# Patient Record
Sex: Female | Born: 1945 | Hispanic: No | Smoking: Former smoker
Health system: Southern US, Community
[De-identification: ages and names within clinical notes are randomized; demographics above are authoritative.]

## PROBLEM LIST (undated history)

## (undated) ENCOUNTER — Emergency Department (HOSPITAL_BASED_OUTPATIENT_CLINIC_OR_DEPARTMENT_OTHER)

## (undated) DIAGNOSIS — I739 Peripheral vascular disease, unspecified: Secondary | ICD-10-CM

## (undated) DIAGNOSIS — I2510 Atherosclerotic heart disease of native coronary artery without angina pectoris: Secondary | ICD-10-CM

## (undated) DIAGNOSIS — R011 Cardiac murmur, unspecified: Secondary | ICD-10-CM

## (undated) DIAGNOSIS — I10 Essential (primary) hypertension: Secondary | ICD-10-CM

## (undated) DIAGNOSIS — R9431 Abnormal electrocardiogram [ECG] [EKG]: Secondary | ICD-10-CM

## (undated) DIAGNOSIS — K219 Gastro-esophageal reflux disease without esophagitis: Secondary | ICD-10-CM

## (undated) DIAGNOSIS — T7840XA Allergy, unspecified, initial encounter: Secondary | ICD-10-CM

## (undated) DIAGNOSIS — M1990 Unspecified osteoarthritis, unspecified site: Secondary | ICD-10-CM

## (undated) DIAGNOSIS — K5732 Diverticulitis of large intestine without perforation or abscess without bleeding: Secondary | ICD-10-CM

## (undated) DIAGNOSIS — M8580 Other specified disorders of bone density and structure, unspecified site: Secondary | ICD-10-CM

## (undated) DIAGNOSIS — F419 Anxiety disorder, unspecified: Secondary | ICD-10-CM

## (undated) DIAGNOSIS — I4891 Unspecified atrial fibrillation: Secondary | ICD-10-CM

## (undated) DIAGNOSIS — E785 Hyperlipidemia, unspecified: Secondary | ICD-10-CM

## (undated) HISTORY — DX: Hyperlipidemia, unspecified: E78.5

## (undated) HISTORY — DX: Atherosclerotic heart disease of native coronary artery without angina pectoris: I25.10

## (undated) HISTORY — PX: APPENDECTOMY: SHX54

## (undated) HISTORY — DX: Cardiac murmur, unspecified: R01.1

## (undated) HISTORY — DX: Abnormal electrocardiogram (ECG) (EKG): R94.31

## (undated) HISTORY — DX: Essential (primary) hypertension: I10

## (undated) HISTORY — PX: SMALL INTESTINE SURGERY: SHX150

## (undated) HISTORY — PX: CATARACT EXTRACTION: SUR2

## (undated) HISTORY — DX: Allergy, unspecified, initial encounter: T78.40XA

## (undated) HISTORY — DX: Peripheral vascular disease, unspecified: I73.9

## (undated) HISTORY — PX: CHOLECYSTECTOMY: SHX55

## (undated) HISTORY — PX: EYE SURGERY: SHX253

## (undated) HISTORY — DX: Gastro-esophageal reflux disease without esophagitis: K21.9

## (undated) HISTORY — DX: Diverticulitis of large intestine without perforation or abscess without bleeding: K57.32

## (undated) HISTORY — PX: ABDOMINAL HYSTERECTOMY: SHX81

## (undated) HISTORY — DX: Unspecified osteoarthritis, unspecified site: M19.90

## (undated) HISTORY — DX: Unspecified atrial fibrillation: I48.91

## (undated) HISTORY — DX: Other specified disorders of bone density and structure, unspecified site: M85.80

---

## 1993-06-17 HISTORY — PX: SUBCLAVIAN ARTERY STENT: SHX2452

## 1999-01-13 ENCOUNTER — Emergency Department (HOSPITAL_COMMUNITY): Admission: EM | Admit: 1999-01-13 | Discharge: 1999-01-13 | Payer: Self-pay | Admitting: Emergency Medicine

## 1999-01-15 ENCOUNTER — Emergency Department (HOSPITAL_COMMUNITY): Admission: EM | Admit: 1999-01-15 | Discharge: 1999-01-15 | Payer: Self-pay | Admitting: Emergency Medicine

## 1999-07-02 ENCOUNTER — Inpatient Hospital Stay (HOSPITAL_COMMUNITY): Admission: AD | Admit: 1999-07-02 | Discharge: 1999-07-04 | Payer: Self-pay | Admitting: Pulmonary Disease

## 1999-07-02 ENCOUNTER — Encounter: Payer: Self-pay | Admitting: Pulmonary Disease

## 1999-08-28 ENCOUNTER — Ambulatory Visit: Admission: RE | Admit: 1999-08-28 | Discharge: 1999-08-28 | Payer: Self-pay | Admitting: Pulmonary Disease

## 1999-12-16 ENCOUNTER — Emergency Department (HOSPITAL_COMMUNITY): Admission: EM | Admit: 1999-12-16 | Discharge: 1999-12-16 | Payer: Self-pay | Admitting: Emergency Medicine

## 2000-07-09 ENCOUNTER — Emergency Department (HOSPITAL_COMMUNITY): Admission: EM | Admit: 2000-07-09 | Discharge: 2000-07-09 | Payer: Self-pay | Admitting: Emergency Medicine

## 2001-02-04 ENCOUNTER — Other Ambulatory Visit: Admission: RE | Admit: 2001-02-04 | Discharge: 2001-02-04 | Payer: Self-pay | Admitting: Obstetrics and Gynecology

## 2001-09-15 ENCOUNTER — Emergency Department (HOSPITAL_COMMUNITY): Admission: EM | Admit: 2001-09-15 | Discharge: 2001-09-15 | Payer: Self-pay | Admitting: Emergency Medicine

## 2001-09-22 ENCOUNTER — Encounter: Payer: Self-pay | Admitting: Cardiovascular Disease

## 2001-09-22 ENCOUNTER — Ambulatory Visit (HOSPITAL_COMMUNITY): Admission: RE | Admit: 2001-09-22 | Discharge: 2001-09-22 | Payer: Self-pay | Admitting: Cardiovascular Disease

## 2001-10-23 ENCOUNTER — Ambulatory Visit (HOSPITAL_COMMUNITY): Admission: RE | Admit: 2001-10-23 | Discharge: 2001-10-24 | Payer: Self-pay | Admitting: Cardiovascular Disease

## 2001-10-23 ENCOUNTER — Encounter: Payer: Self-pay | Admitting: Cardiovascular Disease

## 2003-02-20 ENCOUNTER — Emergency Department (HOSPITAL_COMMUNITY): Admission: EM | Admit: 2003-02-20 | Discharge: 2003-02-20 | Payer: Self-pay | Admitting: Emergency Medicine

## 2003-11-17 ENCOUNTER — Inpatient Hospital Stay (HOSPITAL_COMMUNITY): Admission: AD | Admit: 2003-11-17 | Discharge: 2003-11-19 | Payer: Self-pay | Admitting: Pulmonary Disease

## 2005-04-04 ENCOUNTER — Emergency Department (HOSPITAL_COMMUNITY): Admission: EM | Admit: 2005-04-04 | Discharge: 2005-04-04 | Payer: Self-pay | Admitting: Emergency Medicine

## 2005-09-04 ENCOUNTER — Observation Stay (HOSPITAL_COMMUNITY): Admission: EM | Admit: 2005-09-04 | Discharge: 2005-09-05 | Payer: Self-pay | Admitting: Emergency Medicine

## 2007-07-05 ENCOUNTER — Ambulatory Visit: Payer: Self-pay | Admitting: Hospitalist

## 2007-07-05 ENCOUNTER — Inpatient Hospital Stay (HOSPITAL_COMMUNITY): Admission: EM | Admit: 2007-07-05 | Discharge: 2007-07-11 | Payer: Self-pay | Admitting: Emergency Medicine

## 2007-07-08 ENCOUNTER — Encounter (INDEPENDENT_AMBULATORY_CARE_PROVIDER_SITE_OTHER): Payer: Self-pay | Admitting: Hospitalist

## 2008-01-06 DIAGNOSIS — I739 Peripheral vascular disease, unspecified: Secondary | ICD-10-CM

## 2008-01-06 HISTORY — DX: Peripheral vascular disease, unspecified: I73.9

## 2008-05-25 ENCOUNTER — Inpatient Hospital Stay (HOSPITAL_COMMUNITY): Admission: EM | Admit: 2008-05-25 | Discharge: 2008-05-27 | Payer: Self-pay | Admitting: Emergency Medicine

## 2008-06-03 HISTORY — PX: CARDIAC CATHETERIZATION: SHX172

## 2008-09-19 ENCOUNTER — Inpatient Hospital Stay (HOSPITAL_COMMUNITY): Admission: AD | Admit: 2008-09-19 | Discharge: 2008-09-21 | Payer: Self-pay | Admitting: Cardiovascular Disease

## 2008-09-20 HISTORY — PX: SUBCLAVIAN ARTERY STENT: SHX2452

## 2009-07-13 ENCOUNTER — Ambulatory Visit: Payer: Self-pay | Admitting: Vascular Surgery

## 2009-07-18 ENCOUNTER — Ambulatory Visit: Payer: Self-pay | Admitting: Vascular Surgery

## 2009-07-18 ENCOUNTER — Inpatient Hospital Stay (HOSPITAL_COMMUNITY): Admission: RE | Admit: 2009-07-18 | Discharge: 2009-07-23 | Payer: Self-pay | Admitting: Neurosurgery

## 2009-07-18 HISTORY — PX: SPINE SURGERY: SHX786

## 2009-12-28 ENCOUNTER — Ambulatory Visit: Payer: Self-pay | Admitting: Vascular Surgery

## 2010-01-18 ENCOUNTER — Ambulatory Visit: Payer: Self-pay | Admitting: Vascular Surgery

## 2010-01-23 ENCOUNTER — Inpatient Hospital Stay (HOSPITAL_COMMUNITY): Admission: RE | Admit: 2010-01-23 | Discharge: 2010-01-27 | Payer: Self-pay | Admitting: Vascular Surgery

## 2010-01-23 ENCOUNTER — Ambulatory Visit: Payer: Self-pay | Admitting: Vascular Surgery

## 2010-01-23 HISTORY — PX: OTHER SURGICAL HISTORY: SHX169

## 2010-02-14 ENCOUNTER — Ambulatory Visit: Payer: Self-pay | Admitting: Vascular Surgery

## 2010-03-29 ENCOUNTER — Ambulatory Visit: Payer: Self-pay | Admitting: Vascular Surgery

## 2010-07-08 ENCOUNTER — Encounter: Payer: Self-pay | Admitting: Gastroenterology

## 2010-08-15 ENCOUNTER — Ambulatory Visit (INDEPENDENT_AMBULATORY_CARE_PROVIDER_SITE_OTHER): Admitting: Vascular Surgery

## 2010-08-15 ENCOUNTER — Encounter (INDEPENDENT_AMBULATORY_CARE_PROVIDER_SITE_OTHER)

## 2010-08-15 DIAGNOSIS — Z48812 Encounter for surgical aftercare following surgery on the circulatory system: Secondary | ICD-10-CM

## 2010-08-15 DIAGNOSIS — I6529 Occlusion and stenosis of unspecified carotid artery: Secondary | ICD-10-CM

## 2010-08-15 DIAGNOSIS — M79609 Pain in unspecified limb: Secondary | ICD-10-CM

## 2010-08-15 DIAGNOSIS — I739 Peripheral vascular disease, unspecified: Secondary | ICD-10-CM

## 2010-08-16 NOTE — Assessment & Plan Note (Signed)
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  She had undergone previous attempts at subclavian stenting in the past by Dr. Alanda Amass and ultimately presented with continued problems with dizziness and left arm symptoms.  She had a left carotid subclavian bypass with a 7-mm Dacron graft on January 23, 2010.  She comes in for a 36-month follow-up visit.  She states that she has had no real problems with dizziness since her surgery.  She has had no arm pain. She has some intermittent swelling in the supraclavicular area and has had some paresthesias in the anterior chest wall which are gradually improving.  REVIEW OF SYSTEMS:  She had no chest pressure, chest pain, palpitations or arrhythmias.  PHYSICAL EXAMINATION:  General:  This is a pleasant 65 year old woman who appears her stated age.  Vital signs;  Blood pressure is 135/72 on the right and 121/68 on the left, heart rate is 54, saturation 95% and incision has healed nicely.  She has palpable radial pulse.  I had also performed previous exposure on her for her L5-S1 ALIF and this incision has healed nicely.  I did independently interpret her duplex scan today which shows that her carotid subclavian bypass graft is widely patent.  She has triphasic waveforms throughout.  Overall, I am pleased with her progress and I will see her back p.r.n.    Di Kindle. Edilia Bo, M.D. Electronically Signed  CSD/MEDQ  D:  08/15/2010  T:  08/16/2010  Job:  [id number]

## 2010-08-22 NOTE — Procedures (Unsigned)
VASCULAR LAB EXAM  INDICATION:  Follow up left carotid-to-subclavian bypass graft, placed 01/23/2010.  History of multiple previous attempts at subclavian root vascularization via stenting.  HISTORY: Diabetes:  No. Cardiac:  No. Hypertension:  Yes.  EXAM: 1. Blood pressure on the right is 130 mmHg.  On the left is 114 mmHg. 2. Duplex of the left CCA to the subclavian bypass graft shows patency     with triphasic waveforms throughout.  Please see diagram for     details.  IMPRESSION: 1. Widely patent left common carotid artery to left subclavian graft. 2. Left brachial ulnar and radial arteries are within normal limits.  ___________________________________________ Di Kindle. Edilia Bo, M.D.  LT/MEDQ  D:  08/15/2010  T:  08/15/2010  Job:  [id number]

## 2010-08-31 LAB — URINALYSIS, ROUTINE W REFLEX MICROSCOPIC
Nitrite: NEGATIVE
Protein, ur: NEGATIVE mg/dL
Specific Gravity, Urine: 1.017 (ref 1.005–1.030)
Urobilinogen, UA: 0.2 mg/dL (ref 0.0–1.0)

## 2010-08-31 LAB — CBC
Hemoglobin: 10.8 g/dL — ABNORMAL LOW (ref 12.0–15.0)
Hemoglobin: 12.9 g/dL (ref 12.0–15.0)
MCH: 29.4 pg (ref 26.0–34.0)
MCH: 29.9 pg (ref 26.0–34.0)
Platelets: 268 10*3/uL (ref 150–400)
RBC: 3.67 MIL/uL — ABNORMAL LOW (ref 3.87–5.11)
RBC: 4.32 MIL/uL (ref 3.87–5.11)
WBC: 6.2 10*3/uL (ref 4.0–10.5)

## 2010-08-31 LAB — COMPREHENSIVE METABOLIC PANEL
ALT: 21 U/L (ref 0–35)
AST: 26 U/L (ref 0–37)
Albumin: 3.7 g/dL (ref 3.5–5.2)
Alkaline Phosphatase: 84 U/L (ref 39–117)
CO2: 30 mEq/L (ref 19–32)
Chloride: 96 mEq/L (ref 96–112)
Creatinine, Ser: 0.91 mg/dL (ref 0.4–1.2)
GFR calc Af Amer: >60 mL/min (ref >60)
GFR calc non Af Amer: >60 mL/min (ref >60)
Potassium: 4 mEq/L (ref 3.5–5.1)
Total Bilirubin: 0.5 mg/dL (ref 0.3–1.2)

## 2010-08-31 LAB — SURGICAL PCR SCREEN
MRSA, PCR: NEGATIVE
Staphylococcus aureus: NEGATIVE

## 2010-08-31 LAB — BASIC METABOLIC PANEL
CO2: 25 mEq/L (ref 19–32)
CO2: 29 mEq/L (ref 19–32)
Calcium: 8.3 mg/dL — ABNORMAL LOW (ref 8.4–10.5)
Chloride: 99 mEq/L (ref 96–112)
Creatinine, Ser: 0.75 mg/dL (ref 0.4–1.2)
Creatinine, Ser: 1 mg/dL (ref 0.4–1.2)
GFR calc Af Amer: >60 mL/min (ref >60)
GFR calc Af Amer: >60 mL/min (ref >60)
GFR calc non Af Amer: >60 mL/min (ref >60)
Glucose, Bld: 127 mg/dL — ABNORMAL HIGH (ref 70–99)
Sodium: 134 mEq/L — ABNORMAL LOW (ref 135–145)
Sodium: 137 mEq/L (ref 135–145)

## 2010-08-31 LAB — URINE CULTURE

## 2010-08-31 LAB — TYPE AND SCREEN

## 2010-08-31 LAB — APTT: aPTT: 27 seconds (ref 24–37)

## 2010-09-03 LAB — BASIC METABOLIC PANEL
BUN: 8 mg/dL (ref 6–23)
CO2: 29 mEq/L (ref 19–32)
Glucose, Bld: 110 mg/dL — ABNORMAL HIGH (ref 70–99)
Potassium: 4.4 mEq/L (ref 3.5–5.1)
Sodium: 138 mEq/L (ref 135–145)

## 2010-09-03 LAB — CBC
HCT: 38 % (ref 36.0–46.0)
Hemoglobin: 13.1 g/dL (ref 12.0–15.0)
MCHC: 34.4 g/dL (ref 30.0–36.0)
MCV: 89.2 fL (ref 78.0–100.0)
Platelets: 336 10*3/uL (ref 150–400)
RDW: 13.1 % (ref 11.5–15.5)

## 2010-09-07 LAB — URINALYSIS, ROUTINE W REFLEX MICROSCOPIC
Glucose, UA: NEGATIVE mg/dL
Protein, ur: 30 mg/dL — AB
Specific Gravity, Urine: 1.019 (ref 1.005–1.030)

## 2010-09-07 LAB — CBC
HCT: 32 % — ABNORMAL LOW (ref 36.0–46.0)
Hemoglobin: 11 g/dL — ABNORMAL LOW (ref 12.0–15.0)
MCHC: 34.4 g/dL (ref 30.0–36.0)
MCV: 91.4 fL (ref 78.0–100.0)
RBC: 3.5 MIL/uL — ABNORMAL LOW (ref 3.87–5.11)

## 2010-09-07 LAB — DIFFERENTIAL
Basophils Relative: 1 % (ref 0–1)
Eosinophils Absolute: 0.1 10*3/uL (ref 0.0–0.7)
Eosinophils Relative: 1 % (ref 0–5)
Monocytes Absolute: 0.6 10*3/uL (ref 0.1–1.0)
Monocytes Relative: 7 % (ref 3–12)
Neutro Abs: 5.1 10*3/uL (ref 1.7–7.7)

## 2010-09-07 LAB — URINE MICROSCOPIC-ADD ON

## 2010-09-07 LAB — CULTURE, BLOOD (ROUTINE X 2): Culture: NO GROWTH

## 2010-09-07 LAB — URINE CULTURE: Colony Count: 4000

## 2010-09-26 LAB — DIFFERENTIAL
Basophils Absolute: 0 10*3/uL (ref 0.0–0.1)
Basophils Relative: 0 % (ref 0–1)
Neutro Abs: 5.4 10*3/uL (ref 1.7–7.7)
Neutrophils Relative %: 81 % — ABNORMAL HIGH (ref 43–77)

## 2010-09-26 LAB — CBC
HCT: 31.1 % — ABNORMAL LOW (ref 36.0–46.0)
Hemoglobin: 10.6 g/dL — ABNORMAL LOW (ref 12.0–15.0)
MCHC: 34.2 g/dL (ref 30.0–36.0)
MCHC: 34.5 g/dL (ref 30.0–36.0)
MCV: 88.3 fL (ref 78.0–100.0)
Platelets: 295 10*3/uL (ref 150–400)
RBC: 3.52 MIL/uL — ABNORMAL LOW (ref 3.87–5.11)
RDW: 12.7 % (ref 11.5–15.5)

## 2010-09-26 LAB — BASIC METABOLIC PANEL
CO2: 25 mEq/L (ref 19–32)
CO2: 25 mEq/L (ref 19–32)
Calcium: 8.6 mg/dL (ref 8.4–10.5)
Chloride: 105 mEq/L (ref 96–112)
Creatinine, Ser: 0.81 mg/dL (ref 0.4–1.2)
Creatinine, Ser: 0.86 mg/dL (ref 0.4–1.2)
GFR calc Af Amer: >60 mL/min (ref >60)
GFR calc Af Amer: >60 mL/min (ref >60)
Glucose, Bld: 151 mg/dL — ABNORMAL HIGH (ref 70–99)

## 2010-09-26 LAB — IRON AND TIBC
Saturation Ratios: 13 % — ABNORMAL LOW (ref 20–55)
TIBC: 368 ug/dL (ref 250–470)

## 2010-09-26 LAB — FOLATE RBC: RBC Folate: 842 ng/mL — ABNORMAL HIGH (ref 180–600)

## 2010-10-30 NOTE — Assessment & Plan Note (Signed)
OFFICE VISIT   Traci [name], Traci [name] A  DOB:  15-Nov-1945                                       02/14/2010  KGMWN#:[id number]   I saw the patient in the office today for follow-up after her recent  left carotid subclavian bypass.  This is a 65 year old woman who  presented with pain in her left arm with exertion and recurrent problems  with dizziness.  She had previous attempts at subclavian stenting in the  past, but this had recurred multiple times.  Therefore it was felt the  best option for revascularization was surgical revascularization.  She  underwent left carotid subclavian bypass with a 7-mm Dacron graft on  January 23, 2010.  She did have some initial problems with swelling around  the incision, but this has resolved.  She has had no further problems  with dizziness and no pain in her arm.   EXAMINATION:  Blood pressure is 133/80, heart rate is 74, saturation is  98%.  Lungs are clear bilaterally to auscultation.  Cardiovascular  examination; she has a regular rate and rhythm.  She has palpable radial  pulses.  Her supraclavicular incision on the left is healing nicely.   Overall I am pleased with progress and I will see her back in 6 months  and check a graft duplex.  She knows to call sooner if she has problems.     Di Kindle. Edilia Bo, M.D.  Electronically Signed   CSD/MEDQ  D:  02/14/2010  T:  02/15/2010  Job:  [id number]   cc:   Gerlene Burdock A. Alanda Amass, M.D.  Dalbert Mayotte, M.D.  Danae Orleans. Venetia Maxon, M.D.

## 2010-10-30 NOTE — Consult Note (Signed)
NAMEANGELIAH, WISDOM            ACCOUNT NO.:  [id number]   MEDICAL RECORD NO.:  [id number]          PATIENT TYPE:  INP   LOCATION:  3705                         FACILITY:  [facility]   PHYSICIAN:  Bevelyn Buckles. Champey, M.D.DATE OF BIRTH:  12/15/1945   DATE OF CONSULTATION:  07/08/2007  DATE OF DISCHARGE:                                 CONSULTATION   REASON FOR CONSULTATION:  Headache.   HISTORY OF PRESENT ILLNESS:  This is a 65 year old Caucasian female with  multiple medical problems who presents with severe headache since  Sunday. The patient states she was talking on the phone when she  developed a severe headache in bi-frontal top of the head area. The  headache is described as pressure, 10 out of 10 in intensity, without  any radiation. The patient also felt light headedness and faint feeling,  however, did not lose consciousness. She also had palpitations during  that initially. She had no nausea, photophobia, or phonophobia, then  developed photophobia. She also complained of bright squiggly lines  during her headache and some bilateral finger tingling. She denies any  focal weakness, speech changes, swallowing problems, chewing problems,  vertigo, or loss of consciousness. Headache has improved since  admission. She has had extensive workup with no etiology found.   PAST MEDICAL HISTORY:  Positive for mitral valve prolapse, fibromyalgia,  irritable bowel, PVD, hypertension, history of subclavian stenosis,  supraventricular tachycardia, hepatitis and anxiety.   CURRENT MEDICATIONS:  Tranxene, Zocor, and Tylenol p.r.n.   HOME MEDICATIONS:  Included Lasix, Atenolol, potassium chloride,  Lipitor, and aspirin.   ALLERGIES:  SULFA, ERYTHROMYCIN, CODEINE, SHELLFISH, INTRAVENOUS DYE.   FAMILY HISTORY:  Positive for heart disease and kidney failure.   SOCIAL HISTORY:  The patient is a former smoker. Quit in 1995. Is  married. Lives with her husband.   REVIEW OF SYSTEMS:  Positive  as per HPI. Review of systems is negative  as per HPI and greater than 7 other organ systems.   PHYSICAL EXAMINATION:  VITAL SIGNS:  Temperature 98.6, blood pressure  136/53, pulse 56, respiratory rate 18. O2 sat 99%.  HEENT:  Normocephalic and atraumatic. Extraocular muscles intact. Pupils  are equal. Face is symmetric.  NECK:  Supple. No carotid bruits.  HEART:  Regular.  LUNGS:  Clear.  ABDOMEN:  Soft, nontender.  EXTREMITIES:  Show good pulses.  NEUROLOGIC:  Awake and alert. Language is fluent. Patient follows  commands appropriately. Cranial nerves 2-12 are grossly intact. Motor  examination shows good strength and normal tone in all 4 extremities. No  drift is noted. Sensory examination is within normal limits to light  touch. Reflexes are 1 to 2+ and symmetric. Cerebellar function is within  normal limits. Finger-to-nose is non-assessed secondary to safety.   DIAGNOSTIC STUDIES:  MRI and MRA showed no acute strokes, small vessel  disease. MRA showed no occlusion or stenosis. MRA of the neck showed  mild narrowing at the origin of the bursa bilaterally, decreasing of the  left vertebral artery, question artifact versus narrowing.   A 2-D echocardiogram showed systolic function was normal with mitral  valve prolapse.  LABORATORY DATA:  ANA was negative. ESR is 8. CSF study showed WBC of 2,  RBC is zero. Protein 39, glucose 58. WBC is 5.4. Hemoglobin and  hematocrit 11.1 and 33.0. Platelets 249,000. Sodium 141, potassium 3.8,  chloride 109, CO2 27, BUN 7, creatinine 0.83. Glucose 90.   IMPRESSION:  This is a 65 year old Caucasian female with severe  headache, which has been gradually improving. Some features of the  headache are consistent with migraines and some are atypical for  migraine. The workup has been negative for a cause of headache, so far.  There is a questionable left vertebral narrowing, which might be  artifactual. I will recommend getting a transcranial  Doppler to further  evaluate. Recommend placing the patient back on her aspirin a day, given  her subclavian syndrome and mild narrowing on the MRA with small vessel  disease. Would recommend checking MRV of the brain to rule out any other  possible etiologies for headache. For headaches, I would recommend  giving her Depacon 1 gram IV x1 now and also use Reglan 5 mg IV q.6  hours p.r.n. for headache and nausea if needed. The patient did receive  Darvocet, which also seems to be beneficial to the patient. We will  follow the patient while she is in the hospital. I have discussed the  plan with the primary team and will order these recommendations.      Bevelyn Buckles. Nash Shearer, M.D.  Electronically Signed     DRC/MEDQ  D:  07/08/2007  T:  07/08/2007  Job:  [id number]

## 2010-10-30 NOTE — Procedures (Signed)
CAROTID DUPLEX EXAM   INDICATION:  Subclavian stenosis, preop for carotid to subclavian bypass  graft.   HISTORY:  Diabetes:  No.  Cardiac:  Mitral valve prolapse.  Hypertension:  Yes.  Smoking:  No.  Previous Surgery:  Left subclavian stents.  CV History:  Asymptomatic.  Amaurosis Fugax No, Paresthesias No, Hemiparesis No                                       RIGHT             LEFT  Brachial systolic pressure:         155               99  Brachial Doppler waveforms:         Normal            Abnormal  Vertebral direction of flow:        Antegrade         Retrograde  DUPLEX VELOCITIES (cm/sec)  CCA peak systolic                   100               110  ECA peak systolic                   72                81  ICA peak systolic                   131               107  ICA end diastolic                   35                31  PLAQUE MORPHOLOGY:                  Mixed             Mixed  PLAQUE AMOUNT:                      Mild              Mild  PLAQUE LOCATION:                    ICA               ICA   IMPRESSION:  1. Doppler velocity suggests low end 40%-59% stenosis in the right      internal carotid artery.  2. Doppler velocity suggests high end 20%-39% stenosis in the left      internal carotid artery.  3. Antegrade flow noted in the right vertebral artery.  Retrograde      flow noted in the left vertebral artery.  4. Patent left subclavian artery past proximal stent.  Proximal      subclavian artery 74 cm/second.  Distal subclavian artery 97      cm/second.  5. Unable to accurately visualize the subclavian stent but the highest      velocity obtained was 392 cm/second in the proximal subclavian      artery.       ___________________________________________  Di Kindle. Edilia Bo, M.D.   NT/MEDQ  D:  12/28/2009  T:  12/28/2009  Job:  [id number]

## 2010-10-30 NOTE — Cardiovascular Report (Signed)
NAMENURIA, PHEBUS NO.:  [id number]   MEDICAL RECORD NO.:  [id number]          PATIENT TYPE:  INP   LOCATION:  2507                         FACILITY:  [facility]   PHYSICIAN:  Nanetta Batty, M.D.   DATE OF BIRTH:  11/05/1945   DATE OF PROCEDURE:  DATE OF DISCHARGE:                            CARDIAC CATHETERIZATION    Traci [name] is a 65 year old female with a history of CAD and PAD.  She  has had left subclavian stenting and re-stenting in the past.  Recent  cath performed last year showed a 70% left subclavian artery stenosis  with a 50-mm gradient.  She has demonstrated symptoms from this and was  admitted by Dr. Alanda Amass for premedication because of contrast allergy.  She presents now for angiography and intervention.   PROCEDURE DESCRIPTION:  The patient was brought to the Second Floor  [facility] in the postabsorptive state.  She was  premedicated with p.o. Valium, IV Versed, fentanyl, Solu-Cortef,  Benadryl, and Pepcid.  Her right groin was prepped and shaved in the  usual sterile fashion.  Xylocaine 1% was used for local anesthesia. A  5-  French sheath was inserted into the right femoral artery using standard  Seldinger technique.  A 5-French pigtail catheter and JB1 catheter were  used for arch angiography in the LAO view,Her right groin was prepped  and shaved in usual sterile fashion pills Xylocaine was used local  anesthesia.  A 5-French sheath was inserted in the right femoral artery  using standard Seldinger technique.  A 5-French long tennis racket  catheter was used for arch angiography in the LAO view.  A 5-French long  right Judkins catheter was used for selective innominate angiography  with delayed imaging of the left vertebral and angiography of the left  subclavian artery.  Visipaque dye was used for the entirety of the case.  Retrograde aortic pressures were monitored in the case.   ANGIOGRAPHIC RESULTS:  1. Arch  aortogram.      a.     Type III arch.  2. Right innominate; widely patent.      a.     Right subclavian; eccentric 40% calcified ostial stenosis       unchanged from prior studies.  3. Left subclavian; left subclavian artery had approximately 80%      ostial in-stent restenosis with an 80-mm gradient.    Existing five 5-French sheath was exchanged over wooly wire for a 6-  [place] 90-cm long bright tip Cordis sheath.  The wooly wire was placed  across the subclavian stent into the distal subclavian artery.  Using a  7-mm x 18-mm long Cordis Genesis on Opta premount.  PTA was performed in  the ostium of the right subclavian artery with predilatation using the  same balloon delivery system at 12 atmospheres resulting in reduction of  a 80% ostial stenosis to 0% residual.  The patient tolerated the  procedure well.  The wire was removed.  The sheath was exchanged for a  short 6-French sheath.  The patient left lab in stable condition.  The  ACT was  approximate at 200 and the sheath will be pulled when the ACT is  below 175.  Dr. Alanda Amass reviewed the final films.   IMPRESSION:  Successful percutaneous transluminal angioplasty and  stenting of a symptomatic left subclavian artery in-stent restenosis  with an 80% ostial lesion, ultimately 0% residual secondary to re-  stenting for in-stent restenosis.  The patient will be gently hydrated  and remain on aspirin and Plavix.  She will be discharged home in the  morning and get followup Dopplers of the upper extremities and will see  Dr. Alanda Amass back in followup.      Nanetta Batty, M.D.  Electronically Signed     JB/MEDQ  D:  09/20/2008  T:  09/21/2008  Job:  [id number]   cc:   Second Floor Cardiac Catheterization Lab  [facility]  Dalbert Mayotte, M.D.  Richard A. Alanda Amass, M.D.

## 2010-10-30 NOTE — Consult Note (Signed)
VASCULAR SURGERY CONSULTATION   Traci [name], Traci [name] A  DOB:  04-19-1946                                       07/13/2009  [id number]#:[id number]   I saw the patient in the office today in consultation for anterior  exposure for L5-S1 ALIF by Dr. Venetia Maxon.  This is a pleasant soon to be 65-  year-old woman who injured her back in August of 2009.  She was trying  to catch an obese patient who was falling.  She developed significant  low back pain which has been persistent since that time which is  aggravated somewhat by activity and relieved somewhat with rest and leg  elevation.  The pain radiates down her right leg and she has had some  paresthesias occasionally in the right leg also.  She has had no  symptoms on the left side.  She has tried physical therapy which was not  helpful and also has been on pain medicine which is the only thing which  helps some.  Dr. Venetia Maxon has evaluated the patient and felt that she is a  good candidate for ALIF at L5-S1 and we were asked to provide anterior  exposure.   PAST MEDICAL HISTORY:  Is significant for hypertension and  hypercholesterolemia, both of which are well-controlled on her current  medications.  In addition, she has had previous subclavian stents by Dr.  Alanda Amass.  She denies any history of diabetes, history of previous  myocardial infarction, history of congestive heart failure or history of  COPD.   PAST SURGICAL HISTORY:  Significant for previous cholecystectomy and  appendectomy through a midline incision.  She has also had a  hysterectomy through a Pfannenstiel incision.   FAMILY HISTORY:  There is a strong family history of cardiac disease on  her father's side.  Father died at age 64 from a myocardial infarction.   SOCIAL HISTORY:  She is married.  She has one child.  She quit tobacco  in 1995.   MEDICATIONS:  Are listed on her medical history form.  She is on Plavix  but this has been held pending her  surgery.   REVIEW OF SYSTEMS:  CARDIAC:  She has occasional palpitations and has a  history of a murmur.  She has had no chest pain, chest pressure or  history of atrial fibrillation.  She has had no claudication, rest pain  or nonhealing ulcers.  She has had no history of stroke, TIAs,  expressive or receptive aphasia or amaurosis fugax.  She has had no  history of DVT or phlebitis.  GENERAL:  She has had no weight loss, weight gain or problems with her  appetite.  She is 164 pounds, 5 feet 4 inches tall.  GI:  She does have a history of reflux and hiatal hernia.  NEURO:  She has had dizziness in the past.  MUSCULOSKELETAL:  She does have arthritis.  PSYCHIATRIC:  She has anxiety at times.  Pulmonary, GU, ENT, hematologic and integumentary review of systems is  unremarkable.   PHYSICAL EXAMINATION:  General:  This is a pleasant 65 year old woman  who appears her stated age.  Vital signs:  Temperature is 98, blood  pressure 144/79, heart rate 65.  HEENT:  Pupils equal, round, reactive  to light and accommodation.  Extraocular motions are intact.  Conjunctivae are normal.  Neck:  Neck is supple.  There is no jugular  venous distention.  Lungs:  Lungs are clear bilaterally to auscultation  without rales, rhonchi or wheezing.  Cardiovascular:  She has a soft  right carotid bruit.  She has a regular rate and rhythm without  significant murmur appreciated.  She has no significant peripheral  edema.  She has a palpable radial pulse.  I cannot palpate a left radial  pulse.  She has palpable femoral and posterior tibial pulses  bilaterally.  I cannot palpate dorsalis pedis pulses, however, she has  biphasic deep dorsalis pedis signals with the Doppler.  Abdomen:  Her  abdomen is soft and nontender with normal pitched bowel sounds and a  healed midline incision.  No masses are appreciated.  Musculoskeletal:  She has no major deformities or cyanosis.  Neurological:  She has no  focal weakness  or paresthesias.  Skin:  There are no ulcers or rashes.   I did perform arterial Doppler study in the office today and she has  biphasic Doppler signals in both feet with no evidence of significant  arterial insufficiency.  I did review her previous V/Q scan from back in  December of 2009 which showed no evidence of pulmonary embolus.  Her x-  ray shows significant spondylosis and also some right L5 nerve root  compression.   I have explained our role in exposure for L5-S1 ALIF.  We have discussed  the indications for the procedure and the potential complications  including but not limited to bleeding, arterial or venous injury,  arterial or venous thrombosis, nerve injury, leg swelling or other  unpredictable medical problems.  All questions were answered and she is  agreeable to proceed.   With respect to her subclavian stents she does have a diminished pulse  on the left side with a 30 mmHg pressure gradient being lower on the  left side so she has likely narrowed or occluded her stent.  However,  she simply needs to know to tell them to take her blood pressure in her  right arm.  Her Plavix has been temporarily discontinued pending her  surgery which I think is advisable given the extent of the surgery  required.  All of her questions were answered and she is agreeable to  proceed.  Her surgery has been scheduled for 07/18/2009.     Di Kindle. Edilia Bo, M.D.  Electronically Signed  CSD/MEDQ  D:  07/13/2009  T:  07/14/2009  Job:  [id number]   cc:   Dr Ria Clock  Dr Venetia Maxon

## 2010-10-30 NOTE — Assessment & Plan Note (Signed)
OFFICE VISIT   Traci [name], SCHUKNECHT A  DOB:  April 13, 1946                                       01/18/2010  [id number]#:[id number]   The patient came in today because she had a few questions before her  planned left carotid subclavian bypass.  I have answered all her  questions today and she is good to go for surgery on Tuesday.     Di Kindle. Edilia Bo, M.D.  Electronically Signed   CSD/MEDQ  D:  01/15/2010  T:  01/19/2010  Job:  [id number]

## 2010-10-30 NOTE — H&P (Signed)
Traci [name], Traci [name]            ACCOUNT NO.:  [id number]   MEDICAL RECORD NO.:  [id number]          PATIENT TYPE:  EMS   LOCATION:  MAJO                         FACILITY:  [facility]   PHYSICIAN:  Richard A. Alanda Amass, M.D.DATE OF BIRTH:  1946/02/04   DATE OF ADMISSION:  05/25/2008  DATE OF DISCHARGE:                              HISTORY & PHYSICAL   CHIEF COMPLAINT:  Chest pain.   HISTORY OF PRESENT ILLNESS:  Traci [name] is a pleasant 65 year old  female followed by Dr. Alanda Amass and Dr. Kriste Basque.  She has a history of  vascular disease.  She had left subclavian artery stenting in 1995 with  in-stent restenosis and redo intervention in 1997.  Dr. Alanda Amass cath'd  her in May 2003.  SHE DOES HAVE A HISTORY OF SEVERE DYE REACTION IN THE  PAST, at her last catheterization she was premedicated for a week with  steroids.  She tolerated that procedure well.  __________  She has had  Cardiolite studies, the last one being June 2008, and these have been  negative for ischemia.  She was admitted in January of this year with a  sudden headache and worked up by neurology.  There was no specific cause  found.  She did have an echocardiogram at that time that showed normal  LV function with mild mitral valve prolapse with an eccentric jet of MR.  She saw Dr. Alanda Amass last in June of this year and has been doing well.  She presents to the emergency room now after awakening this morning with  sharp knife-like chest pain that was initially on the left side of her  chest and then mid chest and radiated to her back.  She did break out in  a sweat but had no nausea, vomiting or diaphoresis.  She has no change  in her symptoms with deep inspiration or movement.  She took aspirin at  home without relief.  She did receive some nitroglycerin per EMS and  this did seem to ease her symptoms, although she says now that they are  recurring.  She looks comfortable on exam.  She has not had unusual  palpitations,  although she has some chronic palpitations.  She is  admitted now through the emergency room for further evaluation.   HER HOME MEDICATIONS ARE:  1. Nexium 40 mg a day.  2. Atacand 8 mg a day.  3. Tranxene 7.5 mg a day.  4. Atenolol 25 mg a day.  5. Allegra 60 mg a day.  6. Premarin 0.625 mg a day.  7. Lipitor 40 mg a day.  8. Aspirin 81 mg a day.  9. Ambien 10 mg h.s. p.r.n.   SHE IS ALLERGIC TO SULFA, IV CONTRAST, SHELLFISH, ERYTHROMYCIN, AND  INTOLERANT TO CODEINE AND DILANTIN, WHICH CAUSED SOME NAUSEA AND  VOMITING.  PAST RECORDS INDICATE THAT SHE MAY HAVE HAD ANGIOEDEMA  SECONDARY TO ATACAND IN THE PAST, BUT ULTIMATELY THIS WAS FELT NOT TO BE  THE CASE AND SHE IS TOLERATING HER ATACAND NOW.   PAST MEDICAL HISTORY:  Is remarkable for vascular disease as noted  above.  She also  has a history of fibromyalgia and has been seen by Dr.  Phylliss Bob in the past.  She has had irritable bowel syndrome for years, she  now sees Dr. Elnoria Howard.  She has treated hypertension and treated  dyslipidemia.  She has had anxiety and panic attacks in the past.  She  has had a history of SVT remotely.  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   PREVIOUS SURGERIES INCLUDE:  Appendectomy, hysterectomy and  cholecystectomy.   SOCIAL HISTORY:  She quit smoking in 1995.  She is married and works as  a Lawyer.  She has 1 son.   FAMILY HISTORY:  Is remarkable for coronary disease, her father had  coronary disease and died of an MI.   REVIEW OF SYSTEMS:  She had normal renal function, hematology profile,  and LFTs in July 2009.  Her LDL in July 2009 was 58.  She has had  Doppler studies done in July 2009 of her lower extremities that were  essentially normal.  Renal Dopplers July 2009 were also essentially  normal.  She has had some left arm weakness she thinks over the last  week but no pain.  She does have a history of past sinusitis.  She has  had a remote hiatal hernia by endoscopy in the 70s.    PHYSICAL EXAM:  Blood pressure of 136/58 on the right, 145/48 on the  left, pulse 80, respirations 12.  GENERAL:  She is a well-developed, well-nourished female in no acute  distress.  HEENT:  Normocephalic.  She wears glasses.  Extraocular muscles are  intact, sclerae are anicteric.  NECK:  Without JVD.  She had bilateral carotid bruits, right greater  than left.  CHEST:  Clear to auscultation and percussion.  CARDIAC EXAM:  Reveals regular rate and rhythm without obvious murmur,  rub or gallop, Normal S1 and S2, I could appreciate no AI.  ABDOMEN:  Nontender, she has a midline surgical scar, bowel sounds are  present.  EXTREMITIES:  Reveal 3+/4 dorsalis pedis pulses bilaterally, she does  have a soft right femoral artery bruit.  NEURO EXAM:  Grossly intact.  She is awake, alert and oriented and  cooperative.  Moves all extremities without obvious deficit.  SKIN:  Warm and dry.   INITIAL LABS:  Show normal hematology profile with a hemoglobin of 12.6,  INR 1.0, troponin is negative.  D-dimer is normal 0.26.  Chest x-ray  shows cardiomegaly without edema.  EKG shows sinus rhythm without acute  changes.   IMPRESSION:  1. Chest pain, rule out myocardial infarction.  2. History of vascular disease with previous left subclavian PTA in      1995 with in-stent restenosis 1997, no in-stent restenosis May      2003.  3. Negative Myoview June 2008.  4. Normal left ventricular function with mitral valve prolapse  5. Treated hypertension.  6. Treated dyslipidemia.  7. History of fibromyalgia.  8. History of irritable bowel syndrome.  9. HISTORY OF INTRAVENOUS CONTRAST ALLERGY WITH SEVERE REACTIONS IN      THE PAST, SUCCESSFULLY PRETREATED WITH STEROIDS MAY 2003.  10.Normal left ventricular function with mitral valve prolapse and      mild mitral regurgitation.  11.Anxiety.   PLAN:  The patient will be admitted to telemetry to rule out MI.  She  will be treated with a GI cocktail now x1  as well as IV nitroglycerin,  sublingual nitroglycerin did seem to help en route.  If  it were not for  her contrast allergy, I would put her on heparin and order a CT of her  chest with contrast to rule out dissection.  This will need to be  considered at some point, she may need to be pretreated with IV steroids  before we proceed with further evaluation.      Abelino Derrick, P.A.      Richard A. Alanda Amass, M.D.  Electronically Signed    LKK/MEDQ  D:  05/25/2008  T:  05/25/2008  Job:  [id number]

## 2010-10-30 NOTE — Assessment & Plan Note (Signed)
OFFICE VISIT   Traci [name], Traci [name] A  DOB:  10-28-1945                                       12/28/2009  EAVWU#:[id number]   I saw the patient in the office today concerning a left subclavian  stenosis.  She was referred by Dr. Alanda Amass.  This is a pleasant 65-  year-old woman who has had a long history of pain in her left arm  associated with exertion.  She has also had problems with dizziness  which occurs on a daily basis.  She has had multiple previous attempts  at subclavian stenting on the left side.  Her initial procedure was in  1995.  She had a repeat angioplasty and stenting in 1997.  She states  she had further work in 2009 and most recently in April of 2010 had  another stent placed in the proximal left subclavian artery.  She has  had recurrent stenosis within the stent and continues to have left arm  symptoms.  She was sent for consideration for carotid subclavian bypass.  Her pain in her arm involves her entire arm from her shoulder to her  hand and is brought on by exertion and relieved somewhat with rest.  These symptoms have been going on for several months and have been  stable.  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   Her past medical history is significant for hypertension and  hypercholesterolemia.  She denies any history of diabetes, history of  myocardial infarction, history of congestive heart failure or history of  COPD.   FAMILY HISTORY:  There is a strong family history of cardiac disease on  her father's side.  Father died at age 19 from a myocardial infarction.   SOCIAL HISTORY:  She is married.  She has one child.  She quit tobacco  in 1995.   REVIEW OF SYSTEMS:  GENERAL:  She has had no recent weight loss, weight  gain or problems with her appetite.  CARDIOVASCULAR:  She has had no  chest pain or chest pressure.  She does have occasional palpitations.  She admits to dyspnea on exertion.   She has had atrial fibrillation in  the past.  GI:  She has a history of reflux and hiatal hernia.  NEUROLOGIC:  She has occasional headaches.  She has had no blackouts or  seizures.  MUSCULOSKELETAL:  She has a history of arthritis and muscle pain.  Pulmonary, GU, psychiatric, ENT, hematologic review of systems is  unremarkable and is documented on the medical history form in her chart.   PHYSICAL EXAMINATION:  General:  This is a pleasant 65 year old woman  who appears her stated age.  Vital signs:  Blood pressure is 155/72 on  the right and 99/64 on the left.  Heart rate is 58, respiratory rate 12.  HEENT:  Unremarkable.  Lungs:  Clear bilaterally without rales, rhonchi  or wheezing.  Cardiovascular:  Soft right carotid bruit.  She has a  regular rate and rhythm.  She has palpable femoral pulses and palpable  posterior tibial pulses bilaterally.  She has a normal right radial  pulse with a diminished left radial pulse.  I do not see evidence of  atheroembolic disease of the left hand.  Abdomen:  Soft and nontender  with normal pitched bowel sounds.  She has a healed left  lower quadrant  incision where she had anterior lumbar interbody fusion by Dr. Venetia Maxon.  Neurological:  She has no focal weakness or paresthesias.  Skin:  There  are no ulcers or rashes.   Given her persistent symptoms in the left arm despite multiple attempts  at endovascular revascularization of the left subclavian artery I think  it would be reasonable to proceed with carotid subclavian bypass  grafting.  Typically we would want a preoperative arteriogram, however,  she has had multiple problems with arteriograms in the past and she has,  according to her, arrested during catheterization.  She has a  significant dye allergy and is also allergic to steroids according to  the patient which makes it difficult for her to be premedicated.   We did obtain a carotid duplex scan in the office today which I  independently  interpreted and this shows no evidence of significant  common carotid artery disease on the left and no evidence of significant  bifurcation disease on the left.  Likewise there is no significant right  carotid stenosis.  The subclavian on the left distal to the stents  appears to be patent.  She does have retrograde flow in the left  vertebral artery consistent with subclavian steal syndrome.   Based on her duplex study I think she is a candidate for carotid  subclavian bypass.  We have discussed the indications for the procedure  and the potential complications including but not limited to stroke (1%-  2% risk), nerve injury, bleeding, wound healing problems, lymphatic  injury, MI or other unpredictable medical problems.  All of her  questions were answered and she is agreeable to proceed.  She is  scheduled to see Dr. Venetia Maxon for followup concerning her back and would  like to discuss this with him before proceeding with the surgery.  Once  she is agreeable to proceed we will need to stop her Plavix 1 week  preoperatively.     Di Kindle. Edilia Bo, M.D.  Electronically Signed   CSD/MEDQ  D:  12/28/2009  T:  12/29/2009  Job:  [id number]   cc:   Gerlene Burdock A. Alanda Amass, M.D.  Dalbert Mayotte, M.D.  Dr Linzie Collin

## 2010-10-30 NOTE — Cardiovascular Report (Signed)
[place], [place] NO.:  [id number]   MEDICAL RECORD NO.:  [id number]          PATIENT TYPE:  INP   LOCATION:  2030                         FACILITY:  [facility]   PHYSICIAN:  Traci [name], M.D.   DATE OF BIRTH:  1945/12/05   DATE OF PROCEDURE:  DATE OF DISCHARGE:                            CARDIAC CATHETERIZATION   [name] is a 65 year old female with history of PAD status post  left subclavian artery PTA and stenting in 1995 with re-stenting several  years later.  The patient has no history of CAD.  Her risk factors are  positive for ex-tobacco abuse, family history, treated hypertension, and  dyslipidemia.  She also has GERD.  She was admitted on May 25, 2008,  with unstable angina.  Enzymes were negative.  EKG showed no changes.  She has a severe contrast allergy and was premedicated with Solu-Medrol,  prednisone, Pepcid, and Benadryl.  She presents now for angiography and  potential intervention.   PROCEDURE DESCRIPTION:  The patient was brought to the second floor  [facility] in a postabsorptive state.  She was  premedicated with p.o. Valium, IV Versed, fentanyl, Solu-Medrol,  prednisone, Benadryl, and Pepcid.  Her right groin was prepped and  shaved in the usual sterile fashion.  1% Xylocaine was used for local  anesthesia.  A 6-French sheath was inserted into the right femoral  artery and vein using standard Seldinger technique.  A 6-French right  and left Judkins diagnostic catheters along with 6-French pigtail  catheter were used for selective coronary angiography, left  ventriculography, supravalvular aortography and selective left  subclavian artery angiography.  Visipaque dye was used for the entirety  of the case.  Retrograde aortic and left ventricular blood pressures  were recorded.   HEMODYNAMICS:  1. Aortic systolic pressure 179 and diastolic pressure 81.  2. Left ventricular systolic pressure 185 and diastolic  pressure 27.   SELECTIVE CORONARY ANGIOGRAPHY:  1. Left main normal.  LIMA to LAD:.  The LAD had a 50-60% hypodense      lesion in the midportion just after the second small to medium-      sized diagonal branch.  2. Circumflex; free of significant disease.  3. Right coronary artery; dominant and free of significant disease.  4. Supravalvular aortography; performed in the LAO view using 20 mL of      Visipaque dye 20 mL per second.  Arch vessels appeared intact.      There was a type 1 arch.  There did appear to be a patent left      subclavian artery stent.   [name] has an intermediate lesion in the mid LAD.  We will  proceed with IVUS-guided evaluation.   The patient received Angiomax bolus with an ACT of greater than 200.  Using a 6-French FL-35 guide catheter along with OM4 190  Asahi wire and  an IVUS catheter, intravascular ultrasound was performed.  There was a  napkin lesion just in the LAD at the point of takeoff of the diagonal  branch.  The lesion measured 1.58 x 1.91 mm giving  a circumferential  diameter of 2.18 mm2.  However, this did involve the ostium of the  diagonal branch making measurements somewhat unreliable.   IMPRESSION:  [name]  has an intermediate lesion with potential  significance by intravascular ultrasound, though it does involve the  origin of the diagonal branch.  She also has a 70% in-stent restenosis  of the left subclavian artery stent with a 50-mm pullback gradient.  Because of her prior intolerance to Plavix and because of the  intermediate atrial lesion, I have elected not to intervene at this  point.  We will review her Plavix history from office notes with Dr.  Alanda [name], her primary cardiologist as well as review the cineangiograms  and intravascular ultrasound images prior to making a decision with  regard to percutaneous intervention.   Guidewire and catheter removed.  The sheath was then secured in place.  The patient left the  lab in stable condition.  The sheath will be  removed in 2-3 hours.      Traci [name], M.D.  Electronically Signed     JB/MEDQ  D:  05/26/2008  T:  05/26/2008  Job:  [id number]   cc:   II [facility]  [facility]  Traci [name], M.D.

## 2010-10-30 NOTE — Discharge Summary (Signed)
NAMEJAMILEX, BOHNSACK            ACCOUNT NO.:  [id number]   MEDICAL RECORD NO.:  [id number]          PATIENT TYPE:  INP   LOCATION:  2030                         FACILITY:  [facility]   PHYSICIAN:  Richard A. Alanda Amass, M.D.DATE OF BIRTH:  10-31-1945   DATE OF ADMISSION:  05/25/2008  DATE OF DISCHARGE:  05/27/2008                               DISCHARGE SUMMARY   DISCHARGE DIAGNOSES:  1. Chest pain, myocardial infarction ruled out.  2. Coronary artery disease with a 50-60% narrowing in the left      anterior descending with normal circumflex, normal right coronary      artery, and normal left ventricular function.  3. Peripheral vascular disease with 70% left subclavian artery      narrowing at catheterization this admission.  4. Prior left subclavian percutaneous transluminal angioplasty in 1995      and 1997.  5. Normal left ventricular function with mitral valve prolapse.  6. History of IV contrast reaction that was severe in the past,      pretreated with steroids this admission.  7. Irritable bowel syndrome.  8. Fibromyalgia.  9. Anxiety.  10.Treated hypertension.  11.Treated dyslipidemia.  12.Gastroesophageal reflux.   HOSPITAL COURSE:  The patient is a 65 year old female followed by Dr.  Alanda Amass with vascular disease and other multiple risk factors for  coronary artery disease.  She has never had a cardiac catheterization.  She had a negative Myoview in June 2008, and had normal LV function by  echo in February 2009.  She presented on May 25, 2008, with chest  pain that had some atypical and some typical components.  She was  admitted with unstable angina, but not started on heparin because of the  remote possibility she may have a dissection.  We did not CT scan her  chest immediately, because of her history of severe contrast allergies  in the past.  Her troponins were negative.  We started her on nitrates.  She was pretreated with Solu-Medrol, Pepcid, and Benadryl.   She  underwent diagnostic catheterization on May 26, 2008, which  revealed normal left main, normal RCA, normal circumflex with a 50-60%  mid LAD at the takeoff of the second diagonal.  IVUS was undertaken.  Her films were reviewed with Dr. Alanda Amass.  Dr. Alanda Amass feels the  patient should be treated medically for now.  If she has recurrent  angina or an abnormal Myoview as an outpatient, then we would set her up  for an intervention to this site.  There is a question of some problem  with Plavix in the past, this was mentioned by Dr. Kinnie Scales in 2005, but  not since.  The patient is not sure about this.  The patient did have a  D-dimer that was slightly elevated at 0.49 and underwent V/Q scan prior  to discharge, which was negative for pulmonary embolism.   DISCHARGE MEDICATIONS:  1. Tenormin 25 mg a day.  2. Lipitor 40 mg a day.  3. Nexium 40 mg a day.  4. Allegra 60 mg a day.  5. Benicar 10 mg a day.  6. Tranxene 7.5 mg  a day.  7. Premarin 0.625 mg a day.  8. Aspirin 81 mg 2 tablets a day.  9. Plavix 75 mg a day has been added to her medicines.  10.Nitroglycerin sublingual p.r.n.   LABORATORIES:  White count 11.9, hemoglobin 11.1, hematocrit 33.4, and  platelets 262.  INR 1.0.  Sodium 145, potassium 4.3, BUN 7, and  creatinine 0.77.  Cholesterol 132 and LDL 69.  Troponins are negative.  Urinalysis is essentially unremarkable.  Chest x-ray shows borderline  cardiomegaly without failure.  EKG showed sinus rhythm without acute  changes.   DISPOSITION:  The patient is discharged in stable condition.  She will  have an outpatient Myoview next week and then follow up with Dr.  Alanda Amass.      Abelino Derrick, P.A.      Richard A. Alanda Amass, M.D.  Electronically Signed    LKK/MEDQ  D:  05/27/2008  T:  05/28/2008  Job:  [id number]

## 2010-10-30 NOTE — Discharge Summary (Signed)
NAMEJULE, Traci [name]            ACCOUNT NO.:  [id number]   MEDICAL RECORD NO.:  [id number]          PATIENT TYPE:  INP   LOCATION:  2507                         FACILITY:  [facility]   PHYSICIAN:  Richard A. Alanda Amass, M.D.DATE OF BIRTH:  02/25/1946   DATE OF ADMISSION:  09/19/2008  DATE OF DISCHARGE:  09/21/2008                               DISCHARGE SUMMARY   DISCHARGE DIAGNOSES:  1. Left subclavian stenosis, symptomatic with left steal syndrome.  2. Status post pulmonary vein angiogram with in-stent restenosis of      the left subclavian stent with percutaneous transluminal      angioplasty and stenting with a 7 x 18 Genesis.  3. The intravenous pyelogram dye allergy with angioedema and      anaphylaxis in the past.  She was to be treated as an outpatient 3-      5 days with prednisone.  However, she took two prednisone and she      had lip swelling.  Thus, she was brought into the hospital early      for a dye prophylaxis and monitoring fluids she was given in the      hospital.  She was given Solu-Cortef/hydrocortisone cortisone p.o.      She had no problems with this until she states she was given IV      Solu-Cortef pre-cath and she had some little tingling in her lips      and thumbs had rash in them which is not obvious now.  She also was      treated with Pepcid and Allegra.  She has also in the past had a      reaction to Solu-Medrol.  4. Hyperlipidemia.  5. History of mitral valve prolapse.  6. Gastroesophageal reflux disease.  7. History of hysterectomy on hormone replacement therapy.  8. Hypokalemia, repleted prior to discharge.   LABORATORY DATA:  ESR was 6.  Sodium 138, potassium 3.2, chloride 105,  CO2 25, glucose 88, BUN 8, and creatinine 0.86.  Ferritin was 24.  Retic  count percent was 1.1, absolute was 44.7.  Vitamin B12 was 515, total  iron was 47, TIBC was 368, and percent sat was 13.  Hemoglobin 1.9,  hematocrit 34.3, WBC 6.6, and platelets 295.  Chest x-ray  showed no  active cardiopulmonary disease.   HOSPITAL COURSE:  Traci [name] was brought into the hospital early for  her PV angiogram and left subclavian stenting on September 19, 2008 because  she had a reaction to prednisone p.o. on an outpatient basis for her IVP  dye allergy.  Her lips started to swell.  She went to Urgent Care.  She  was given IM Benadryl there and an EpiPen which she did not have to use.  She continued to take the Benadryl over the weekend on Monday. We called  her to bring her into the hospital to try her on hydrocortisone and Solu-  Cortef for allergy prophylaxis under monitoring.  She received IV  hydrocortisone without any reaction.  She went on to have her PV  angiogram.  She was found to have in-stent restenosis  of her left  subclavian stent reduced from 80 to 0.  She did have a Genesis implanted  7 x 18.  The following day, she was doing well.  Her blood pressure was  133/46, heart rate was 64, respirations were 16, and potassium was low.  She was given 40 mEq of potassium prior to her discharge.  She had  walked in the halls.  Her groin was without any swelling and she was  discharged home.   DISCHARGE MEDICATIONS:  1. Atenolol 25 mg a day.  2. Tranxene 7.5 mg a day.  3. Atacand 8 mg every evening.  4. Premarin 0.625 mg every day.  5. Lipitor 10 mg every day.  6. Aspirin 81 mg every day.  7. Ambien 10 mg at night.  8. Plavix 75 mg a day.  9. Nexium 40 mg a day.   FOLLOWUP:  She will follow up with Dr. Alanda Amass in the office and she  will have Dopplers prior to her appointment with Dr. Alanda Amass.      Lezlie Octave, N.P.      Richard A. Alanda Amass, M.D.  Electronically Signed    BB/MEDQ  D:  09/21/2008  T:  09/22/2008  Job:  [id number]   cc:   Dalbert Mayotte, M.D.

## 2010-11-02 NOTE — Discharge Summary (Signed)
Traci [name], Traci [name]            ACCOUNT NO.:  [id number]   MEDICAL RECORD NO.:  [id number]          PATIENT TYPE:  INP   LOCATION:  2017                         FACILITY:  [facility]   PHYSICIAN:  Richard A. Alanda Amass, M.D.DATE OF BIRTH:  1945/12/05   DATE OF ADMISSION:  09/04/2005  DATE OF DISCHARGE:  09/05/2005                                 DISCHARGE SUMMARY   DISCHARGE DIAGNOSES:  1.  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  2.  Remote peripheral vascular disease.  3.  Irritable bowel syndrome.  4.  Anxiety.  5.  Fibromyalgia   HOSPITAL COURSE:  The patient is a 66 year old female known to Dr. Alanda Amass  with history of peripheral vascular disease and hypertension. She presented  to the emergency room on September 04, 2005 with swelling in the left side of  her neck. Symptoms were intermittent and did seem to affect her speech and  breathing. She saw her primary care doctor and was treated for possible  otitis with antibiotics and steroids which was ineffective. She underwent a  neck CT and chest CT both of which were unrevealing. It was advised that the  patient go to the emergency room if she fell in her swelling and shortness  of breath were increasing and she presented September 04, 2005 with these  complaints. She was seen by Dr. Jenne Campus on exam. Her chest x-ray was  reviewed to rule out subcutaneous air and this was negative. Dr. Jenne Campus  continued her prednisone 20 milligrams and then 10 milligrams and then stop.  We did stop her Atacand. As of now, we will assume her swelling is  angioedema secondary to ARB. She will need to avoid ACE inhibitors and ARBs  because of this. Dr. Jacinto Halim felt she could be discharged on September 05, 2005.   LABS:  White count 10.2, hemoglobin 13.1, hematocrit 38, platelets 424,000.  Sodium 137, potassium 3.9, BUN 15, creatinine 1.0. Liver functions were  normal. TSH is 0.640. Chest x-ray shows no active disease. EKG shows sinus  rhythm without acute  changes.   DISCHARGE MEDICATIONS:  1.  Atenolol 25 milligrams a day.  2.  Coated aspirin daily.  3.  Tranxene 7.5 milligrams a day.  4.  Premarin 0.9 milligrams a day.  5.  Lipitor 10 milligrams a day.  6.  Benadryl p.r.n.   She has been instructed to stop her Atacand, avoid ACE inhibitors and ARBs.  She will follow up Dr. Alanda Amass in a couple weeks.      Abelino Derrick, P.A.      Richard A. Alanda Amass, M.D.  Electronically Signed    LKK/MEDQ  D:  09/05/2005  T:  09/06/2005  Job:  [id number]

## 2010-11-02 NOTE — Discharge Summary (Signed)
[name], [name]            ACCOUNT NO.:  [id number]   MEDICAL RECORD NO.:  [id number]          PATIENT TYPE:  INP   LOCATION:  3705                         FACILITY:  [facility]   PHYSICIAN:  Eliseo Gum, M.D.   DATE OF BIRTH:  1946-03-23   DATE OF ADMISSION:  07/05/2007  DATE OF DISCHARGE:  07/11/2007                               DISCHARGE SUMMARY   CONSULTATIONS:  1. Dr. Pearletha Furl. Alanda Amass, Cardiology.  2. Dr. Bevelyn Buckles. [place], Neurology.   DISCHARGE DIAGNOSES:  1. [place] Headache, of unclear etiology.  2. Mitral valve prolapse.  3. Fibromyalgia.  4. Irritable bowel syndrome.  5. 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  6. Subclavian stenosis, status post a stent in 1995.  7. Hypertension.  8. Supraventricular tachycardia.  9. 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  10.Anxiety disorder.  11.Appendectomy in 1979.  12.Hysterectomy in 1993.   DISCHARGE MEDICATIONS:  1. Clorazepate 7.5 mg at bedtime.  2. Allegra one tab daily.  3. Aspirin 81 mg, one tab daily.  4. Lipitor 40 mg daily.  We increased the dose of Lipitor.  5. Norvasc 5 mg daily.  6. Depakote 500 mg extended release, one tab daily.  7. Levophed one tab q.6h. p.r.n. pain.   DISPOSITION/FOLLOWUP:  1. She will follow up with her cardiologist, Dr. Alanda Amass with an      appointment in two to three weeks.  She might benefit from a Holter      to follow up her bradycardia, and with her prior history of      tachycardia. These need to be clarified.  2. She also has an appointment with Dr. Nash Shearer in one to two weeks,      to see a resolution of the headache and improvement and the results      of the transthoracic Doppler.  3. She also needs to be followed up for the metanephrine labs, and an      anemia workup as an outpatient.  She may benefit from a      colonoscopy.   PROCEDURE:  1. An echocardiogram showed left ventricular systolic function was      normal.  Left ventricular diastolic  function  parameters were      normal.  There was a mild mitral valve prolapse involving the      anterior left leaflet.  There was mild mitral valve regurgitation.  2. 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  The menisci appear normal on both the right and the      left, slightly asymmetric anterior translation of the condyles is      noted.  3. MRI of the neck shows grossly suggestion of minor narrowing of the      origin of both vertebral arteries.  Slight drop off in two areas of      the mid-cervical segment of the left vertebral artery.  The      proximal aspect of the left subclavian artery is suboptimally      visualized, secondary to venous contamination as described.  4. An  MRI of the brain:  No evidence of acute ischemia.  Mildly      prominent pituitary with homogeneous signal, mild ethmoid and      maxillary sinus mucosal thickening.  There is no aneurysm noted.  5. A CT of the head was negative for intracranial abnormalities or for      subarachnoid hemorrhage.   HISTORY OF PRESENT ILLNESS:  [name] is a 65 year old female with a  past medical history of left subclavian stenosis, status post a stent in  1995.  She has a history of hypertension, fibromyalgia and anxiety, who  was in her usual state of health until the day of admission when she  started feeling a sudden onset of a pressure-like headache 10/10.  It  was accompanied with a tremor, palpitations, chest pain, dyspnea and  lightheadedness.  She related at home the blood pressure was 210/109 at  that time.  She also described some scotoma and tingling in her arms and  feet.  All of this happened while she was talking with her husband by  phone. She denies an argument.  She was with her son, and she never lost  consciousness, as per her son.  She denies fevers, chills, diarrhea or  constipation.   PHYSICAL EXAMINATION:  VITAL SIGNS:  Temperature 97.9 degrees, blood   pressure 134/86, pulse 63, respirations 18, saturation 98%.  GENERAL:  The patient is lying in bed, in no acute distress.  HEENT:  Eyes:  Pupils equal, reactive to light.  Extraocular muscles  intact, anicteric.  LUNGS:  Respirations:  Clear breath sounds bilaterally.  No rales, no  rhonchi, no wheezing.  CARDIOVASCULAR/NECK:  S1 and S2 normal.  Regular rhythm and rate.  No  jugular venous distention.  No murmurs.  Carotid bruits, on the right  side more than the left side.  ABDOMEN:  Bowel sounds positive.  Nontender, no guarding.  NEUROLOGIC:  Alert and oriented x3.  Cranial nerves II-XII  intact.  Normal sensation.  Motor strength:  Is 5/5 throughout.   LABORATORY DATA:  On admission CK-MB 1, troponin 0.05.  The pH 7.44, TSH  0.73.  Hemoglobin 13.7, white blood cells 5.7, hematocrit 40.9,  platelets 331, MCV 89.  Sodium 139, potassium 4.1, chloride 108, bicarb  26, BUN 13, creatinine 0.9, glucose 117.   HOSPITAL COURSE:  #1 - HEADACHE:  [name] was admitted to the  telemetry floor.  A CT scan was done and was negative for subarachnoid  hemorrhage.  Possibility that the CT scan did not show the subarachnoid  hemorrhage, so we consulted the division of neurology for an LP, which  was negative for a subarachnoid hemorrhage and was also negative for  bacterialor or aseptic meningitis.  An ESR was ordered, thinking  arteritis.  The ESR was normal.  We gave to the patient for pain Tylenol  because she did not tolerate very well opioids.  She gets nausea and  vomiting with the opioids.  We were considering other causes of  headache, like venous thrombosis, but on MRI there was no sign of  thrombosis on the venous system.  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  We consulted  neurology because on the MRI the patient had vertebral artery narrowing.  Neurology  recommended a transcranial Doppler and to start the patient on  Depakote for the headache.  During the hospitalization  the headache  improved after the Depakote.   #2 - HYPOTENSION:  The patient was having hypotension with a blood  pressure of 108/54.  She was orthostatic positive.  We are considering  that this was secondary to Lasix use.  We gave her a bolus and then we  held her Lasix medication.  The blood pressure improved to 131/70.   #3 - PALPITATIONS:  We were considering that this was secondary to  anxiety and placed her on telemetry.  There was no recurrence of any  arrhythmia recorded during this hospitalization.  During this  hospitalization the patient was bradycardic.   #4 - BRADYCARDIA:  We were considering that the bradycardia was  secondary to atenolol.  We held the atenolol during the hospitalization,  and her pulse increased to 57 to 62.   #5 - CHEST PAIN:  We placed the patient on telemetry.  The cardiac  enzymes x3 were negative.  The electrocardiogram with no changes other  than the bradycardia.  During hospitalization she did not have any chest  pain.   CONDITION ON DISCHARGE:  On the day of discharge the patient was stable  with improved headache.  Her blood pressure was 132/57, pulse 67,  respirations 20, oxygen saturation 95% on room air, temperature 98  degrees.   DISCHARGE LABORATORY DATA:  White blood cell count 5.9, hemoglobin 11.2,  hematocrit 38.2, platelets 168.  Sodium 141, potassium 4, chloride 108,  bicarb 27, BUN 6, creatinine 0.8, glucose 81.  LDL 60,  HDL 53,  triglycerides 54.      Hartley Barefoot, MD  Electronically Signed      Eliseo Gum, M.D.  Electronically Signed    BR/MEDQ  D:  07/27/2007  T:  07/28/2007  Job:  [id number]   cc:   Gerlene Burdock A. Alanda Amass, M.D.  Bevelyn Buckles. Nash Shearer, M.D.

## 2010-11-02 NOTE — Discharge Summary (Signed)
[place]. [facility]  Patient:    JOYELL EMAMI                      MRN: [id number] Adm. Date:  [id number] Disc. Date: [id number] Attending:  Verdene Lennert Dictator:   Earley Favor, RN, MSN, ACNP CC:         Lonzo Cloud. Kriste Basque, M.D. [facility]             Richard A. Alanda Amass, M.D.                           Discharge Summary  DATE OF BIRTH:  08-26-1945  DISCHARGE DIAGNOSIS: 1.  Hoarseness consistent with upper respiratory infection.  PROCEDURES:  None.  HISTORY OF PRESENT ILLNESS:  Ms. Jeanice Lim is a 65 year old white female, a patient Dr. Sheliah Mends, who presented to his office as an acute walk-in on July 02, 1999.  She has been treated as an outpatient with azithromycin and Medrol that ere started on June 27, 1999 without improvement in her symptoms of left ear pain and fever.  She reported in his office she was unable to swallow.  She had chest discomfort and continued to be febrile with a fever of 101 degrees.  Due to her  having proven refractory to outpatient treatment she was admitted for further evaluation and treatment.  LABORATORY DATA:  A 12-lead EKG shows normal sinus rhythm with sinus arrhythmia. Chest x-ray shows heart and vascular area are normal, left subclavian artery stent is noted, lungs are clear.  Paranasal sinuses, no evidence of paranasal sinus disease.  WBC 6.6, RBC 4.14, hemoglobin 12.9, hematocrit 36.6, MCV 88.5, MCHC 35.3, RDW 12.7, platelets 360,000.  ESR is 14.  PT is 14.2, INR is 1.2, PTT is 27. Sodium is 136, potassium 3.4, chloride 103, CO2 31, glucose 131, BUN 9, creatinine 0.8.  Calcium 8.3, total protein 6.9, albumin 3.2.  AST is 26, ALT 21, ALP 67.  Total bilirubin 0.3.  TSH is 0.65.  Urinalysis was remarkable for many bacteria.  HOSPITAL COURSE: #1 - UPPER RESPIRATORY INFECTION:  Ms. Eliott Nine was admitted to [facility] on July 02, 1999.  Subsequent x-rays of the chest and sinuses  were unremarkable. She was treated initially with IV antibiotics, IV steroids, along with mucolytics and bronchodilators.  She responded to treatment.  Due to the chronicity of her  illness an ENT physician appointment will be arranged for her on an outpatient basis.  DISCHARGE MEDICATIONS: 1.  Levaquin 500 mg 1 q.d. 2.  Flonase 2 sprays in each nostril b.i.d. 3.  Darvocet-N 100 1 tablet q.4h. p.r.n. for pain. 4.  She will also continue her home medications of Atenolol and Ativan and aspirin.  DIET:  As tolerated.  SPECIAL INSTRUCTIONS:  She is to follow up with ENT physician.  She will follow up with Dr. Alroy Dust in two weeks.  DISCHARGE CONDITION:  Swallowing ability has returned.  She continues to have hoarseness, cough consistent with upper respiratory infection, and will be evaluated in the future by ENT physician.DD:  07/19/99 TD:  07/19/99 Job: [id number]

## 2010-11-02 NOTE — Consult Note (Signed)
Traci [name], Traci [name]                        ACCOUNT NO.:  [id number]   MEDICAL RECORD NO.:  [id number]                   PATIENT TYPE:  INP   LOCATION:  5707                                 FACILITY:  [facility]   PHYSICIAN:  Griffith Citron, M.D.             DATE OF BIRTH:  1946/06/09   DATE OF CONSULTATION:  11/17/2003  DATE OF DISCHARGE:                                   CONSULTATION   REFERRING PHYSICIAN:  Lonzo Cloud. Kriste Basque, M.D. [facility]   REASON FOR CONSULTATION:  The patient is a 2 year old white female known to  me from prior GI evaluation five years ago for abnormal LFT's.  I was asked  by Dr. Kriste Basque to evaluate acute symptoms of nausea, vomiting, and diarrhea.   HISTORY OF PRESENT ILLNESS:  The patient was in her usual state of excellent  health until this past Sunday, four days ago.  The symptoms began with the  onset of diarrhea and mild nausea.  These rapidly progressed to the point  where she was experiencing 5 to 10 bowel movements per day.  Nausea  precluded oral intake and intermittent vomiting over the past four days.  Chilled without rigor or diaphoresis.  No documented fever.  The stool has  been watery without blood.  No pus or mucous noted.  No history of similar  prior episodes.  Symptoms have continued unabated over the past four days  and the patient has become progressively weaker necessitating admission for  IV hydration, observation, and diagnostic evaluation.  She has associated  symptoms of abdominal cramping pain rather diffuse in nature, but  predominantly in the lower quadrants.  These abate after bowel movements,  but rapidly recur.  No hematemesis, melena, or hematochezia.  Has been  experiencing some pyrosis, no odynophagia or dysphagia.  No dyspepsia.   Denies symptoms of back pain, urinary tract symptoms such as dysuria,  frequency, or polyuria.  No vaginal discharge.   The patient is unaware of any family, friends, or coworkers with similar  illness.   She is unaware of eating any spoiled or tainted foods, although,  she was at the beach just prior to the onset of symptoms.  She did not eat  any raw shellfish to which she is highly allergic.   Minimal risk factor of cardioprotective doses of aspirin.  No NSAID.  No  tobacco or alcohol.   PAST MEDICAL HISTORY:  Hyperlipidemia, peripheral vascular disease,  hypertension, mitral valve prolapse.  Status post cholecystectomy.  Status  post left subclavian stent placement.   ALLERGIES:  MYCIN ANTIBIOTICS, PLAVIX, ALTACE, SHELLFISH.   CURRENT MEDICATIONS:  1. Lipitor.  2. Altace.  3. HRT.  4. Allegra.  5. Hydrochlorothiazide.   SOCIAL HISTORY:  The patient lives with her fiance and grown son in a  private home.  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  Prior  history of tobacco use, none in several years.  No alcohol abuse.   PHYSICAL EXAMINATION:  GENERAL:  Mildly ill-appearing, middle-aged white  female appearing stated age, alert and oriented, and comfortable in no acute  distress.  VITAL SIGNS:  Stable and afebrile.  HEENT:  Anicteric sclerae.  Mild conjunctival pallor.  No oropharyngeal  lesion of the lip, gums, or tongue.  NECK:  Supple, no adenopathy.  Loud right carotid bruit.  No thyromegaly.  CHEST:  Clear to auscultation with no adventitious sound.  HEART:  Regular rhythm, no gallop and no murmur.  ABDOMEN:  Soft and nondistended, nontender.  Bowel sounds are present  without abdominal bruits.  No firmness or mass.  No palpable organomegaly.  Approximately 8 cm to percussion.  RECTAL:  Not performed.  EXTREMITIES:  Without cyanosis, clubbing, or edema.  SKIN:  Good turgor, no jaundice.  No stigmata of chronic liver disease.  NEUROLOGY:  Grossly intact without focal deficit.   LABORATORY DATA:  CBC; normal hemoglobin 13.0, hematocrit 38.1, WBC 6.0,  platelets 390,000. Normal differential.  Normal coagulation studies with pro-  time INR 1.0, PTT 27, normal  CMET including normal LFT's with AST 26, ALT  21, total bilirubin 0.7, alkaline phosphatase 68.   ASSESSMENT:  1. Probable viral gastroenteritis - the simultaneous occurrence of both     upper and lower gastrointestinal symptoms, four-day time course, highly     suggestive of infectious etiology.  No history to suggest bacterial     dysentery.  I would anticipate symptoms clearing in the next 24 to 48     hours.  I think supportive measures should suffice at this time.  I would     obtain stool specimens for culture in the remote possibility that her     illness progresses.  2. Elevated liver enzymes - resolved.  3. Right carotid bruit.  Uncertain if this is a new finding.  No history to     review for comparison.  I will defer further evaluation of this to Dr.     Kriste Basque, the patient's primary physician.  4. Colorectal neoplasia surveillance, not completed to date.  I discussed     with the patient the advisability of colonoscopy when she has fully     recuperated.  This is indicated because of the patient's age greater than     50.   RECOMMENDATIONS:  1. Supportive measures - IV fluids, antiemetics, clear liquids.  2. Diet - clear liquids, to be advanced as appetite returns.  3. Laboratory - stool C/S, O/P x3, hemoccult, fecal WBC, C.difficile toxin.  4. Colonoscopy - advised in approximately one month assuming the patient's     clinical course rapidly improves.                                               Griffith Citron, M.D.    Shawna Orleans  D:  11/17/2003  T:  11/18/2003  Job:  [id number]

## 2010-11-02 NOTE — Cardiovascular Report (Signed)
[place]. [facility]  Patient:    Traci [name], Traci [name] Visit Number: [id number] MRN: [id number]          Service Type: DSU Location: [phone] Attending Physician:  Traci [name] Dictated by:   Traci [name] Traci [name], M.D. Proc. Date: 10/23/01 Admit Date:  10/23/2001 Discharge Date: 10/24/2001   CC:         Traci [name] M. Traci [name], M.D. [facility]  Traci [name], M.D.  Sixth Floor PV Lab   Cardiac Catheterization  PROCEDURES:  Retrograde central aortic catheterization, aortic root angiogram, LAO projection, selective left subclavian artery, right internal carotid artery, right brachiocephalic artery, common carotid artery angiograms by hand injection of transstenotic gradient pullback pressure measurement, Abdominal aortic angiogram  midstream PA projection, right femoral artery puncture closure with 6-French Perclose single stitch device, successful, premedication for prior history of DYE ALLERGY.  CARDIOLOGIST:  Traci [name], M.D.  BRIEF HISTORY:  This 65 year old widow, mother of one son, full-time employ, prior smoker, strong family history of coronary disease, has multiple medical problems including premature peripheral arterial disease, hyperlipidemia, hypertension, asymptomatic mitral valve prolapse, mild, GERD that is stable, previous cigarette abuse, allergic history (allergic to "MYCINS" and possibly PLAVIX and ALTACE) on HRT, remote hysterectomy.  Traci [name] presented with classical subclavian steel symptoms and severely diminished left upper extremity pulse detected by Dr. Kriste [name] in 1995.  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  It was recanalized, dilated with PTA and tandem P2/04 stents on June 04, 1994. She tolerated this well and had significant clinical improvement with resolution of her left upper extremity claudication and subclavian steel vertebral symptoms.  She developed recurrent  symptoms with restenosis on duplex testing and underwent repeat dilatation with 7 mm balloon PTA on April 01, 1996 with good angiographic result.  Over the last several months, she has had bilateral upper extremity weakness, intermittent discomfort including shoulder discomfort and some elbow discomfort. She said that she felt that she has had some weakness in her arms where she could not hold on to objects.  She has been seen by Dr. Estill [name], and serologic testing has been performed to rule out rheumatologic disease, but apparently this has been unrevealing.  Because of prior history of DYE allergy, she underwent outpatient MRA angiogram on September 22, 2001.  This demonstrated what was felt to be high-grade right subclavian stenosis before the vertebral, high-grade ostial left common carotid artery stenosis, and possible obstruction of her left subclavian stents.  Recognizing the difficulties with MR angiography because of calcification and metallic stents, it was felt best to proceed with angiography in this symptomatic patient.  She has had prior DYE allergy with history of angioedema at angiography April 01, 1996, despite premedication preoperatively with steroids and antihistamines.  Consultation was obtained with Dr. Jethro [name] about the case, and it was recommended she be placed on steroids and H1 antagonist prior to the procedure for approximately 3 to 5 days.  The patient was premedicated with 30 mg a day of prednisone as an outpatient, Allegra 60 mg b.i.d, and Pepcid 20 mg q.d.   On the day of the procedure at laboratory, she was given 80 mg of Solu-Medrol IV and was given 50 mg Benadryl IV in the laboratory. She was also given 2 mg Versed for sedation in the laboratory; 1% Xylocaine was used for local anesthesia.  DESCRIPTION OF PROCEDURE:  The right femoral artery was entered with single anterior puncture using, and a 5-French  Cordis side-arm sheath was inserted without  difficulty.  A Wholey wire was used for exchange.  A 5-French pigtail catheter was positioned in the ascending aorta, and aortic arch angiography was performed in the LAO projection with DSA imaging at 30 cc, 20 cc per second.  The catheter was pulled down above the level of the renal arteries, and abdominal aortic angiogram was done at 20 cc, 20 cc per second, visualization to the proximal iliacs.  The catheter was exchanged for a right coronary catheter.   Selective right brachiocephalic, right subclavian artery were performed with the right coronary catheter using hand injections and visualization to the right axillary artery.  Selective left subclavian was done with the right coronary catheter; however, this was not entirely coaxial; so it was changed to a multipurpose 5-French catheter, and selective left subclavian artery angiography was done by hand injection along with pullback pressure across the previously placed stent.  Ostial and proximal left common carotid artery hand injection weas also performed in angled projections. Regular imaging and DSA imaging  was used for brachiocephalic vessel imaging. Visipaque dye was used throughout the procedure.  Catheter was removed.  Side-arm sheath was flushed.  Right femoral angiogram was done by hand injection in the oblique projection.  The right femoral puncture was closed using standard Perclose technique with a 6-French single stitch Perclose device successfully.  It should be noted that the patients blood pressure was in the 230 range systolic with sinus rhythm at 65 at the beginning of the procedure.  She was given labetalol 10 mg IV x 2, sublingual nitroglycerin 0.04 mg, along with sedation.  Her blood pressure came down in the 160 range.  There was a pulse deficit between of 20 mmHg between the central aortic reading and her left upper and right and left lower extremity.   ANGIOGRAMS:  Aortic arch angiogram in the LAO  projection demonstrated a smooth aortic type 1 aortic arch.  Right brachiocephalic artery was widely patent with no ostial stenosis.  The right common carotid artery proximally was normal.  There was overlap in the proximal right brachiocephalic artery.  The right vertebral was normal, and the RIMA was widely patent.  The origin of the left common carotid showed some possible eccentric narrowing on this injection. The left subclavian stent appeared to be widely patent with no significant stenosis in the mid subclavian artery with a patent left vertebral.  Abdominal aortic angiogram in the midstream PA projection showed a patent proximal celiac and superior mesenteric artery access.  The renal arteries were single normal bilaterally with normal flow.  The infrarenal abdominal aorta had very mild atherosclerotic disease, and there was mild irregularity of the proximal common iliacs bilaterally but no significant stenosis and good flow to the visualized mid portion.  The IMA was intact.  Selective right brachiocephalic artery in multiple projections showed eccentric calcification of the mid portion of the right brachiocephalic inferiorly.  There was eccentric stenosis of approximately 40% that was smooth with minimal post stenotic dilatation beyond this.  There was good flow on hand injection.  The right common carotid artery was normal in its visualized portion to the mid cervical area.  On catheter pullback, there was no gradient across the right brachiocephalic eccentric smooth stenosis.  The left subclavian tandem stent showed approximately 40% smooth concentric narrowing with intimal proliferation that was fairly typical but no ostial stenosis and excellent reflux into the aorta.  There was about 30% narrowing in the distal third  of the stent.  There was no transstenotic gradient on catheter pullback across the stent.  The left vertebral and LIMA were widely patent.  Visualization of  the subclavian beyond the LIMA showed a normal-appearing vessel.  There was no ______  stenosis or narrowing.  The left axillary artery was widely patent, and the left brachial artery was widely patent visualized to the left elbow where there was a normal radial and ulnar bifurcation.  No evidence of arterial spasm was present.  Selective left common carotid artery by hand injection at the ostial and proximal portion showed less than 20% mildly eccentric narrowing with no transstenotic gradient and good flow with normal left common carotid artery visualized to the mid cervical region.  The right femoral artery was smooth and widely patent.  The puncture was into the right common femoral artery.  The profunda and superficial femoral artery junction appeared normal, and right ______  appeared normal.  DISCUSSION:  This patient has a widely patent left subclavian artery stent with no significant gradient and no subclavian mid stenosis as suggested by MRA.  Her right brachiocephalic narrowing is calcific and approximately 40% smooth with no gradient across it.  There is no significant stenosis of the left common carotid artery and no gradient.  Normal renal arteries with severe labile hypertension.  The patient has a prior history of rash when given Altace and Plavix, and it is not clear which one it was from.  She was also started on Celebrex at the time; so it could have been from any one of these three which were all discontinued.  Because she is a candidate for trial of ARB along with her medical therapy for hypertension and possibly addition of a vasodilator such as Norvasc or Procardia.  The etiology of her upper extremity complaints is not clear.  She will continue to follow up with Dr. Estill [name] to rule out any rheumatologic problems.  She may need evaluation of her cervical spine to rule out a neuropathic etiology as well.  CATHETERIZATION DIAGNOSES:  1. Atherosclerotic  peripheral vascular disease.  2. Remote left subclavian artery recanalization and tandem stenting     June 04, 1994.  3. Left subclavian restenosis, ostial, treated with percutaneous     transluminal angioplasty successfully March 31, 1996.  No     restenosis on this study (see above).  4. Normal subclavian arteries bilaterally.  5. Normal axillary arteries bilaterally.  6. Normal left brachial arteries.  7. Normal renal arteries with severe hypertension.  8. Hyperlipidemia.  9. Past cigarette abuse. 10. Past history of DYE allergy, none at this setting. 11. Mitral valve prolapse, asymptomatic. 12. Systemic hypertension. 13. Strong family history of coronary disease. 14. Gastroesophageal reflux disease on therapy. 15. Past hysterectomy on hormone replacement therapy. 16. Possible upper extremity arthritis. Dictated by:   Traci [name] Traci [name], M.D. Attending Physician:  Traci [name] DD:  10/23/01 TD:  10/26/01 Job: [id number]

## 2010-11-02 NOTE — Discharge Summary (Signed)
Traci [name], Traci [name]                        ACCOUNT NO.:  [id number]   MEDICAL RECORD NO.:  [id number]                   PATIENT TYPE:  INP   LOCATION:  5707                                 FACILITY:  [facility]   PHYSICIAN:  Lonzo Cloud. Kriste Basque, M.D. [facility]            DATE OF BIRTH:  [birth year]   DATE OF ADMISSION:  11/17/2003  DATE OF DISCHARGE:                                 DISCHARGE SUMMARY   FINAL DIAGNOSES:  1. Admitted November 17, 2003 via the office with a three to four-day history of     progressive nausea, vomiting, and inability to keep anything down;     associated with severe nasal congestion, greenish-yellow phlegm and     bifrontal headaches.  Evaluation revealed probable gastroenteritis and     mild sinusitis with a severe emotional component requiring brief     hospitalization.  2. History of recurrent sinusitis with previous evaluation by Dr. Lyman Bishop     in the past.  3. Arteriosclerotic peripheral vascular disease with known left subclavian     artery disease status post PTA and stent followed by Dr. Alanda Amass.  4. History of mitral valve prolapse with supraventricular tachycardia in the     past.  5. History of hypertension - blood pressures low this admission and     medications adjusted.  She had a history of orthostatic hypotension and     syncope with evaluation in 1996.  6. History of hypercholesterolemia controlled on diet.  7. History of hiatus hernia on endoscopy in the 1970's.  8. History of irritable bowel syndrome and chronic constipation followed by     Dr. Kinnie Scales.  9. 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  10.      Status post cholecystectomy.  11.      History of fibromyalgia with rheumatologic evaluation by Dr. Phylliss Bob     in 2003.  12.      History of anxiety and probable panic disorder.  13.      History of urticaria and ALLERGY TO IVP DYE.  She has had previous     allergy evaluation by Dr. Adolph Pollack.  14.       Status post hysterectomy and appendectomy in the past.   BRIEF HISTORY AND PHYSICAL:  The patient is a 65 year old white female who  presented to the office with a 3 to 4-day history of nausea, vomiting, and  some diarrhea with vague abdominal discomfort.  She rated the abdominal  discomfort a 5 out of 10, and this was associated with severe nasal  congestion, yellow phlegm, and bifrontal and maxillary headache which was  estimated at a 6 to 7 out of 10 in severity and associated with the nausea  and vomiting.  She states she could not keep anything down, was dehydrated,  and requested admission for further evaluation and treatment.  As noted, she  has had intermittent abdominal discomfort and diarrhea with a history of  irritable bowel syndrome in the past.  She had a question of fever but noted  chills.  She has a known hiatus hernia and previous cholecystectomy.   PAST MEDICAL HISTORY:  1. SINUSITIS:  She has had several recent episodes of sinusitis which were     treated with decongestants and antibiotics.  She has been concerned about     frequent recurrent episodes and wants further evaluation.  She had a full     ENT evaluation by Dr. Lyman Bishop several years ago with a normal CT of her     sinuses in 1990.  2. ARTERIOSCLEROTIC PERIPHERAL VASCULAR DISEASE:  She has a known left     subclavian artery stenosis with a previous PTA and stent placement by Dr.     Alanda Amass.  Her last angio was in 2003 and the stent was patent.  She had     normal Dopplers in February of 2005, all by Dr. Alanda Amass.  3. MITRAL VALVE PROLAPSE:  She has a history of mitral valve prolapse and an     SVT in the past.  She had a history of some vasomotor instability with     orthostatic hypotension and some syncope evaluated in 1996.  She had a     negative Cardiolite in February, 2005 with no ischemia, ejection fraction     of 70%.  4. HISTORY OF HYPERTENSION:  Controlled on medications and followed by Dr.      Alanda Amass.  5. HISTORY OF HYPERCHOLESTEROLEMIA:  Controlled on diet.  6. GI:  She has a history of hiatus hernia on endoscopy in 1978, and has had     no significant reflux symptoms.  She has a history of irritable bowel     syndrome and chronic constipation and was evaluated by Dr. Kinnie Scales in     2000.  She has a history of elevated liver function tests in the past,     and had hepatitis after blood transfusion in 1975.  She has had previous     cholecystectomy.  7. FIBROMYALGIA:  She had a rheumatologic evaluation by Dr. Phylliss Bob in 2003.  8. ANXIETY AND PROBABLE PANIC DISORDER.  9. A HISTORY OF URTICARIA WITH IVP DYE ALLERGY, and previous evaluation by     Dr. Darlys Gales.  She also lists allergies to SULFA, ERYTHROMYCIN,     CODEINE AND SHELLFISH.   PHYSICAL EXAMINATION:  GENERAL:  At time of admission revealed a thin,  somewhat distraught 65 year old white female in no acute distress.  VITAL SIGNS:  Blood pressure 122/70, pulse 72 per minute, regular  respirations at 20 per minute and not labored.  Temperature 98.8.  HEENT EXAM:  Revealed nasal congestion, the mucosa was red and inflamed with  some tenderness over the maxillary sinuses bilaterally.  Temporal arteries  were nontender.  NECK:  Exam showed no jugular venous distension, no carotid bruits, no  thyromegaly or lymphadenopathy or lymphadenopathy.  CHEST:  Exam was clear to percussion and auscultation.  CARDIAC EXAM:  Revealed a regular rhythm, grade 1/6 systolic ejection murmur  at the left sternal border, question of a mid systolic click, no rubs or  gallops heard.  ABDOMEN:  Revealed minimal epigastric tenderness on palpation, intact bowel  sounds, no evidence of organomegaly or masses.  EXTREMITIES:  Showed no cyanosis, clubbing or edema.  NEUROLOGIC EXAM:  Revealed her to be weak with strength testing 4.5 out of  5  bilaterally and no focal abnormalities detected.  SKIN:  Exam was negative.  LABORATORY DATA:  EKG  showed sinus bradycardia and nonspecific ST, T wave  changes. Chest x-ray revealed heart size to be normal, a left subclavian  stent was noted; lungs were mildly hyperinflated and clear, no acute  changes.  CT scan of the sinuses showed no evidence of sinusitis, there was  bilateral concha bullosa.  Hemoglobin 13.0, hematocrit 38.1, white count  6000 with a normal differential.  Sed rate 15.  Stool for occult blood  negative.  Protime 13.4, INR 1.0, PTT 27 seconds.  Sodium 141, potassium  4.0, chloride 104, CO2 30, BUN 9, creatinine 0.9, blood sugar 100, calcium  8.5, total protein 7.4, albumin 3.4.  AST 26, ALT 21, alkaline phosphatase  68, total bilirubin 0.7.  TSH 0.73.  Stool for C. difficile negative and  stool for pathogens negative.   HOSPITAL COURSE:  The patient was admitted at her instance from the office  with two problems.  One was her GI symptoms including nausea and vomiting  and diarrhea with some vague abdominal discomfort.  She wanted to see Dr.  Kinnie Scales who had evaluated her in the past.  He consulted on her and felt she  had a gastroenteritis which would improve spontaneously.  He recommended  some stool studies which were negative.  Liver enzymes were noted to be  normal.  She needs a routine colonoscopy and was asked to follow up with him  in the office in 1 month.  She improved with IV fluid and conservative  management.   The other problem was a sinus congestion and recurrent sinusitis which  concerned her greatly.  CT of sinuses was negative except for bilateral  concha bullosa.  There were no air fluid levels, etc.  She was treated with  IV Rocephin and Solu-Medrol initially.  She had a reaction to the Medrol  with a rash and this was discontinued.  She was switched to p.o. Levaquin at  discharge.  Her blood pressure was noted to be somewhat low during the  hospitalization and her blood pressure medications were held.  She will  start back on these slowly as an  outpatient and follow up with Dr. Alanda Amass  for her routine visit.   DISCHARGE MEDICATIONS:  1. Entex LA 1 tablet p.o. b.i.d. for nasal congestion.  2. Levaquin 500 mg p.o. daily until gone.  3. Atenolol 25 mg p.o. daily.  4. Diazide 1 p.o. daily.  5. Atacan 4 mg p.o. daily.  6. Clorazepate 7.5 mg p.o. t.i.d. p.r.n. for nerves.  7. Allegra 60 mg p.o. b.i.d. p.r.n. for allergies.  8. Flonase 2 sprays in the nose q.h.s.  9. Aspirin 81 mg p.o. daily.  10.      Lipitor 10 mg p.o. daily as before.   CONDITION ON DISCHARGE:  Improved.   FOLLOW UP:  The patient will follow up with me in the office in 2 weeks, and  will follow with Dr. Kinnie Scales and Dr. Alanda Amass as scheduled.                                                Lonzo Cloud. Kriste Basque, M.D. [facility]    SMN/MEDQ  D:  11/19/2003  T:  11/21/2003  Job:  [id number]   cc:   Gerlene Burdock A. Alanda Amass,  M.D.  [address], [address]  [place]  [place] [address]  Fax: [phone]   Griffith Citron, M.D. [facility] [place]  [place] [address]  Fax: [phone]

## 2010-11-02 NOTE — Cardiovascular Report (Signed)
[place]  Patient:    Traci [name], Traci [name] Visit Number: [id number] MRN: [id number]          Service Type: DSU Location: [phone] Attending Physician:  Ruta Hinds Dictated by:   Pearletha Furl Alanda Amass, M.D. Proc. Date: 10/23/01 Admit Date:  10/23/2001 Discharge Date: 10/24/2001   CC:         Lorin Picket M. Kriste Basque, M.D. [facility]  Helene Kelp, M.D.  [facility]   Cardiac Catheterization  PROCEDURES PERFORMED: 1. Retrograde central aortic catheterization and aortic root angiogram LAO    projection. 2. Selective LSCA, RSCA, RBCA, LCCA angiograms by hand injection with    trans-stenotic gradient pullback pressure measurements. 3. Abdominal aortic angiogram. 4. PA projection right femoral artery. 5. Puncture closure with #6 French Perclose single stitch device -    successful. 6. Premedication for prior history of dye allergy.  BRIEF HISTORY:  This 65 year old, widowed, mother of one son, full-time employed, prior smoker, strong family history of coronary artery disease has multiple medical problems including premature peripheral arterial disease, hyperlipidemia, hypertension, asymptomatic mitral valve prolapse, mild, GERD - stable, prior cigarette abuse, allergic history (allergic to [facility] and possible PLAVIX and ALTACE) on HRT for remote hysterectomy.  The patient presented with classical subclavian Steel symptoms and severely diminished left upper extremity pulse was detected by Lonzo Cloud. Kriste Basque, M.D. [facility] in 1995. 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 It was recannulized and dilated with PTA and Tandem P204 stents on December 1995. She tolerated this well and had significant clinical improvement with resolution of her left upper extremity claudication and subclavian Steel vertebral symptoms. She developed recurrent symptoms with restenosis on Duplex testing and underwent redilatation with 7 mm  balloon PTA on April 01, 1996 with good angiographic result. Over the last several months she has had bilateral upper extremity weakness, intermittent discomfort including shoulder discomfort and some elbow discomfort. She has said that she felt that she has had some weakness in her arms where she could not hold onto objects. She has been seen by Dr. Estill Bakes and serologic testing has been performed to rule out rheumatologic disease, but apparently, this has been unrevealing. Because of her prior history of dye allergy, she underwent outpatient MRA angio on September 22, 2001. This demonstrated what was felt to be high grade right subclavian stenosis before the vertebral, high grade ostial left common carotid artery stenosis and possible obstruction of her left subclavian stents. Recognizing the difficulties with MR angiography because of calcification and metallic stents, it was felt best to proceed with angiography in this symptomatic patient. She has had prior dye allergy with a history of angioedema at angiography April 01, 1996, despite premedication preoperatively with steroids and antihistamines. A consultation was obtained with Dr. Lamarr Lulas about the case and it was recommended that she be placed on steroids and H1 antagonist prior to the procedure for approximately 3-5 days. The patient was premedicated with 30 mg a day of prednisone as an outpatient, Allegra 60 mg b.i.d. and Pepcid 20 mg. q.d. On the day of the procedure she called to the laboratory; she was given 80 mg of Solu-Medrol IV and was given 50 mg of Benadryl IV in the laboratory. She was also given 2 mg of Versed for sedation in the laboratory. Xylocaine 1% was used for local anesthesia.  DESCRIPTION OF PROCEDURE:  The RFA was entered with a single anterior puncture and a #5 [place] Cordis  sidearm sheath was inserted without difficulty. A Wholey wire was used for exchange. A #5 French pigtail catheter was  positioned in the ascending aorta and a aortic arch angiography was performed in the LAO projection with DSA imaging at 30 cc, 20 cc per second. The catheter was pulled down above the level of the renal arteries and abdominal aortic angiogram was done at 20 cc, 20 cc per second with visualization to the proximal iliacs. The catheter was exchanged for a right coronary catheter. Selective right brachiocephalic, RSCA were performed with the right coronary catheter using hand injections and visualization to the right axillary artery. Selective left subclavian was done with the right coronary catheter, however, this was not entirely coaxial, so it was changed to a multipurpose #5 [place] catheter and selective LSCA angiography was done by hand injection along with pullback pressure across the previously placed stent. Ostial and proximal LCCA hand injection was also performed in angled projections. Regular imaging and DSA imaging was used for brachiocephalic vessel imaging. Visipaque dye was used throughout the procedure. The catheters were removed, the sidearm sheath was flushed and right femoral angiogram was done by hand injection in the oblique projection. The right femoral puncture was closed using standard Perclose technique with a #6 [place] single stitch Perclose device successfully.  It should be noted that the patients blood pressure was in the 230 mmHg range systolic with sinus rhythm at 65 at the beginning of the procedure. She was given labetalol 10 mg IV x2, sublingual nitro 0.04 mg along with sedation. Her blood pressure came down in the 160 range. There was a pulse deficit of 20 mmHg between the central aortic reading and her left upper and right and left lower extremity.  ANGIOGRAMS: 1. Aortic arch angiogram in the LAO projection demonstrated a smooth    type I aortic arch. 2. The RBCA was widely patent with no ostial stenosis.  3. The RCCA proximally was normal. 4. There  was overlap in the proximal RBCA. 5. The right vertebral was normal. 6. The RIMA was widely patent. 7. The origin of the left common carotid showed some possible    eccentric narrowing on this injection. 8. The left subclavian stain appeared to be widely patent with no    significant stenosis in the mid subclavian artery with a patent    left vertebral.  ABDOMINAL AORTIC ANGIOGRAM: 1. In the midstream PA projection showed a patent proximal celiac    and SMA axis. 2. The renal arteries were single and normal bilaterally with normal    flow. 3. The infrarenal abdominal aorta had very mild atherosclerotic disease    and there was mild irregularity of the proximal common iliacs    bilaterally, but no significant stenosis and good flow to the    visualized midportion. 5. The IMA was intact.  SELECTIVE RBCA/RCCA:  1. In multiple projections showed eccentric calcification of the mid portion     of the right brachiocephalic inferiorly.  2. There was eccentric stenosis of approximately 40% that was smooth with     minimal post-stenotic dilatation beyond this.  3. There was good flow on hand injection.  4. The RCCA was normal in its visualized portion to the mid cervical     area.  5. On catheter pullback there was no gradient across the right     brachiocephalic eccentric smooth stenosis.  6. The left subclavian Tandem stent showed approximately 40%     smooth concentric narrowing with intimal proliferation  that     was fairly typical but no ostial stenosis and excellent reflux     into the aorta.  7. There was about 30% in the distal third of the stent. There was     no trans-stenotic gradient on catheter pullback across the stent.  8. The left vertebral and LIMA were widely patent.  9. Visualization of the subclavian beyond the LIMA showed a normal     appearing vessel. There was no FMV stenosis or narrowing. 10. The left axillary artery was widely patent and the left brachial      artery was widely patent visualized to the left elbow where there     was a normal radial and ulnar bifurcation. No evidence of arterial     spasm was present.  SELECTIVE LCCA: 1. By hand injection at the ostial proximal portion showed less    than 20% eccentric narrowing with no trans-stenotic gradient    and good flow with a normal LCCA visualized to the mid cervical    region. 2. The right femoral artery was smooth and widely patent. The puncture    was into the RCFA. 3. The profunda/SFA junction appeared normal and REIA appeared    normal.  DISCUSSION:  This patient has a widely patent LSCA stent with no significant gradient and no subclavian mid stenosis as suggested by MRA.  Her right brachiocephalic narrowing is calcific and approximately 40% smooth with no gradient across it. There is no significant stenosis of the LCCA and no gradient. Normal renal arteries with severe labile hypertension.  The patient has a prior history of rash when given Altace and Plavix and it is not clear which one it was from. She was also started on Celebrex at the time so it could have been from any one of these three which were all discontinued. She is a candidate for a trial of ARB along with her medical therapy for hypertension and possibly addition of a vasodilator such as Norvasc or Procardia. The etiology of her upper extremity complaints is not clear.  She will continue to follow up with Dr. Estill Bakes to rule out any rheumatologic problems. She may need evaluation of her cervical spine to rule out a neuropathic etiology as well.  CATHETERIZATION DIAGNOSES:  1. Atherosclerotic periphervascular disease.     a. Remote left subclavian artery recannulization at Tandem stenting        June 04, 1994.  2. Left subclavian restenosis ostial, treated with PTA successfully     April 01, 1996; no restenosis on this study (See Above).  3. Mild 40% smooth eccentric calcific right brachiocephalic  narrowing     without pressure gradient.  4. Normal subclavians bilaterally.  5. Normal axillary arteries bilaterally.  6. Normal left brachial.  7. Normal renal arteries with severe hypertension.  8. Hyperlipidemia.  9. Past cigarette abuse. 10. Past history of DYE ALLERGY, none at this setting. 11. Mitral valve prolapse asymptomatic. 12. Systemic hypertension. 13. Strong family history of coronary artery disease. 14. Gastroesophageal reflux disease on therapy. 15. Past hysterectomy on HRT. 16. Possible upper extremity arthritis. Dictated by:   Pearletha Furl Alanda Amass, M.D. Attending Physician:  Ruta Hinds DD:  10/23/01 TD:  10/26/01 Job: [phone] [id number]

## 2010-11-30 DIAGNOSIS — I10 Essential (primary) hypertension: Secondary | ICD-10-CM

## 2010-11-30 HISTORY — DX: Essential (primary) hypertension: I10

## 2011-03-08 LAB — BASIC METABOLIC PANEL
BUN: 5 — ABNORMAL LOW
BUN: 7
CO2: 26
CO2: 27
CO2: 28
Calcium: 7.8 — ABNORMAL LOW
Calcium: 8.4
Calcium: 8.8
Chloride: 108
Chloride: 108
Chloride: 108
Chloride: 109
Chloride: 109
Creatinine, Ser: 0.83
Creatinine, Ser: 0.85
Creatinine, Ser: 0.91
Creatinine, Ser: 0.95
GFR calc Af Amer: >60
GFR calc Af Amer: >60
GFR calc Af Amer: >60
GFR calc non Af Amer: >60
Glucose, Bld: 81
Glucose, Bld: 83
Glucose, Bld: 90
Potassium: 3.8
Potassium: 4
Sodium: 141
Sodium: 141

## 2011-03-08 LAB — DIFFERENTIAL
Basophils Absolute: 0
Eosinophils Absolute: 0.1
Eosinophils Absolute: 0.1
Eosinophils Absolute: 0.2
Eosinophils Relative: 2
Eosinophils Relative: 2
Lymphocytes Relative: 51 — ABNORMAL HIGH
Lymphs Abs: 2.2
Lymphs Abs: 3
Monocytes Absolute: 0.3
Monocytes Absolute: 0.4
Monocytes Relative: 7
Monocytes Relative: 7
Neutro Abs: 2.3
Neutrophils Relative %: 39 — ABNORMAL LOW

## 2011-03-08 LAB — CBC
HCT: 33 — ABNORMAL LOW
HCT: 34.5 — ABNORMAL LOW
HCT: 40.9
Hemoglobin: 11 — ABNORMAL LOW
Hemoglobin: 11.9 — ABNORMAL LOW
MCHC: 33.7
MCV: 88.3
MCV: 89.3
MCV: 89.4
MCV: 89.7
Platelets: 249
Platelets: 268
Platelets: 284
Platelets: 331
RBC: 3.72 — ABNORMAL LOW
RDW: 11.9
RDW: 12.3
RDW: 12.5
WBC: 5.4
WBC: 5.8
WBC: 5.9

## 2011-03-08 LAB — APTT: aPTT: 28

## 2011-03-08 LAB — POCT I-STAT CREATININE: Operator id: 257131

## 2011-03-08 LAB — COMPREHENSIVE METABOLIC PANEL
Albumin: 3.3 — ABNORMAL LOW
Alkaline Phosphatase: 70
BUN: 12
Calcium: 8.3 — ABNORMAL LOW
Glucose, Bld: 104 — ABNORMAL HIGH
Potassium: 4.2
Total Protein: 6.5

## 2011-03-08 LAB — URINALYSIS, ROUTINE W REFLEX MICROSCOPIC
Bilirubin Urine: NEGATIVE
Glucose, UA: NEGATIVE
Hgb urine dipstick: NEGATIVE
Ketones, ur: NEGATIVE
Protein, ur: NEGATIVE

## 2011-03-08 LAB — POCT CARDIAC MARKERS: Operator id: 257131

## 2011-03-08 LAB — PROTEIN AND GLUCOSE, CSF
Glucose, CSF: 58
Total  Protein, CSF: 39

## 2011-03-08 LAB — CSF CELL COUNT WITH DIFFERENTIAL
Eosinophils, CSF: 0
Tube #: 3

## 2011-03-08 LAB — CATECHOLAMINES, FRACTIONATED, URINE, 24 HOUR
Catecholamines T: 15 ug/24hr (ref <100)
Epinephrine 24 Hr Urine: 2 ug/24hr (ref <20)
Norepinephrine 24 Hr Urine: 13 ug/24hr (ref <80)
Time-CATEUR: 24

## 2011-03-08 LAB — I-STAT 8, (EC8 V) (CONVERTED LAB)
BUN: 13
Glucose, Bld: 117 — ABNORMAL HIGH
HCT: 43
Hemoglobin: 14.6
Operator id: 257131
Potassium: 4.1
Sodium: 139
TCO2: 27

## 2011-03-08 LAB — LIPID PANEL
HDL: 53
LDL Cholesterol: 60
Triglycerides: 54
VLDL: 11

## 2011-03-08 LAB — CARDIAC PANEL(CRET KIN+CKTOT+MB+TROPI)
CK, MB: 0.6
Relative Index: INVALID
Relative Index: INVALID
Total CK: 62
Troponin I: 0.01
Troponin I: 0.01
Troponin I: <0.01

## 2011-03-08 LAB — SEDIMENTATION RATE: Sed Rate: 8

## 2011-03-08 LAB — D-DIMER, QUANTITATIVE: D-Dimer, Quant: <0.22

## 2011-03-08 LAB — ANA: Anti Nuclear Antibody(ANA): NEGATIVE

## 2011-03-08 LAB — METANEPHRINES, URINE, 24 HOUR
Metanephrines, Ur: 315
Normetanephrine, 24H Ur: 117

## 2011-03-08 LAB — TSH: TSH: 0.412

## 2011-03-08 LAB — CSF CULTURE W GRAM STAIN

## 2011-03-08 LAB — MAGNESIUM: Magnesium: 1.9

## 2011-03-08 LAB — CORTISOL: Cortisol, Plasma: 5.9

## 2011-03-22 LAB — CARDIAC PANEL(CRET KIN+CKTOT+MB+TROPI)
CK, MB: 0.4 ng/mL (ref 0.3–4.0)
CK, MB: 0.5 ng/mL (ref 0.3–4.0)
Relative Index: INVALID (ref 0.0–2.5)
Total CK: 58 U/L (ref 7–177)
Total CK: 59 U/L (ref 7–177)
Troponin I: <0.01 ng/mL (ref 0.00–0.06)

## 2011-03-22 LAB — D-DIMER, QUANTITATIVE
D-Dimer, Quant: 0.26 ug/mL-FEU (ref 0.00–0.48)
D-Dimer, Quant: 0.49 ug/mL-FEU — ABNORMAL HIGH (ref 0.00–0.48)

## 2011-03-22 LAB — LIPID PANEL
LDL Cholesterol: 69 mg/dL (ref 0–99)
VLDL: 16 mg/dL (ref 0–40)

## 2011-03-22 LAB — CBC
HCT: 37.4 % (ref 36.0–46.0)
Hemoglobin: 12.6 g/dL (ref 12.0–15.0)
MCHC: 33.8 g/dL (ref 30.0–36.0)
Platelets: 311 10*3/uL (ref 150–400)
RBC: 3.69 MIL/uL — ABNORMAL LOW (ref 3.87–5.11)
RDW: 12.6 % (ref 11.5–15.5)
WBC: 11.9 10*3/uL — ABNORMAL HIGH (ref 4.0–10.5)

## 2011-03-22 LAB — COMPREHENSIVE METABOLIC PANEL
ALT: 27 U/L (ref 0–35)
Albumin: 3.3 g/dL — ABNORMAL LOW (ref 3.5–5.2)
Calcium: 8.5 mg/dL (ref 8.4–10.5)
GFR calc Af Amer: >60 mL/min (ref >60)
Glucose, Bld: 92 mg/dL (ref 70–99)
Potassium: 3.8 mEq/L (ref 3.5–5.1)
Sodium: 140 mEq/L (ref 135–145)
Total Protein: 6.6 g/dL (ref 6.0–8.3)

## 2011-03-22 LAB — CK TOTAL AND CKMB (NOT AT ARMC)
CK, MB: 0.4 ng/mL (ref 0.3–4.0)
Relative Index: INVALID (ref 0.0–2.5)
Total CK: 60 U/L (ref 7–177)

## 2011-03-22 LAB — BASIC METABOLIC PANEL
BUN: 10 mg/dL (ref 6–23)
Calcium: 8.3 mg/dL — ABNORMAL LOW (ref 8.4–10.5)
Creatinine, Ser: 0.77 mg/dL (ref 0.4–1.2)
Creatinine, Ser: 0.86 mg/dL (ref 0.4–1.2)
GFR calc Af Amer: >60 mL/min (ref >60)
GFR calc Af Amer: >60 mL/min (ref >60)
GFR calc non Af Amer: >60 mL/min (ref >60)
GFR calc non Af Amer: >60 mL/min (ref >60)

## 2011-03-22 LAB — DIFFERENTIAL
Basophils Absolute: 0 10*3/uL (ref 0.0–0.1)
Basophils Relative: 1 % (ref 0–1)
Eosinophils Relative: 4 % (ref 0–5)
Monocytes Absolute: 0.4 10*3/uL (ref 0.1–1.0)
Neutro Abs: 2.7 10*3/uL (ref 1.7–7.7)

## 2011-03-22 LAB — URINE MICROSCOPIC-ADD ON

## 2011-03-22 LAB — PROTIME-INR
INR: 1 (ref 0.00–1.49)
Prothrombin Time: 13 seconds (ref 11.6–15.2)
Prothrombin Time: 13.9 seconds (ref 11.6–15.2)

## 2011-03-22 LAB — URINALYSIS, ROUTINE W REFLEX MICROSCOPIC
Bilirubin Urine: NEGATIVE
Glucose, UA: NEGATIVE mg/dL
Ketones, ur: NEGATIVE mg/dL
pH: 6 (ref 5.0–8.0)

## 2011-03-22 LAB — POCT CARDIAC MARKERS
CKMB, poc: <1 ng/mL — ABNORMAL LOW (ref 1.0–8.0)
Myoglobin, poc: 66.1 ng/mL (ref 12–200)
Myoglobin, poc: 79.2 ng/mL (ref 12–200)

## 2011-03-22 LAB — TROPONIN I: Troponin I: <0.01 ng/mL (ref 0.00–0.06)

## 2011-03-26 DIAGNOSIS — R9431 Abnormal electrocardiogram [ECG] [EKG]: Secondary | ICD-10-CM

## 2011-03-26 HISTORY — DX: Abnormal electrocardiogram (ECG) (EKG): R94.31

## 2011-06-09 DIAGNOSIS — L239 Allergic contact dermatitis, unspecified cause: Secondary | ICD-10-CM | POA: Insufficient documentation

## 2011-06-25 DIAGNOSIS — J00 Acute nasopharyngitis [common cold]: Secondary | ICD-10-CM | POA: Diagnosis not present

## 2011-06-27 DIAGNOSIS — I25810 Atherosclerosis of coronary artery bypass graft(s) without angina pectoris: Secondary | ICD-10-CM | POA: Diagnosis not present

## 2011-06-27 DIAGNOSIS — I10 Essential (primary) hypertension: Secondary | ICD-10-CM | POA: Diagnosis not present

## 2011-06-27 DIAGNOSIS — Z981 Arthrodesis status: Secondary | ICD-10-CM | POA: Diagnosis not present

## 2011-06-27 DIAGNOSIS — Z7902 Long term (current) use of antithrombotics/antiplatelets: Secondary | ICD-10-CM | POA: Diagnosis not present

## 2011-06-27 DIAGNOSIS — Z7982 Long term (current) use of aspirin: Secondary | ICD-10-CM | POA: Diagnosis not present

## 2011-06-27 DIAGNOSIS — I059 Rheumatic mitral valve disease, unspecified: Secondary | ICD-10-CM | POA: Diagnosis not present

## 2011-06-27 DIAGNOSIS — Z79899 Other long term (current) drug therapy: Secondary | ICD-10-CM | POA: Diagnosis not present

## 2011-06-27 DIAGNOSIS — R209 Unspecified disturbances of skin sensation: Secondary | ICD-10-CM | POA: Diagnosis not present

## 2011-06-27 DIAGNOSIS — R42 Dizziness and giddiness: Secondary | ICD-10-CM | POA: Diagnosis not present

## 2011-06-28 DIAGNOSIS — I10 Essential (primary) hypertension: Secondary | ICD-10-CM | POA: Diagnosis not present

## 2011-06-28 DIAGNOSIS — I69998 Other sequelae following unspecified cerebrovascular disease: Secondary | ICD-10-CM | POA: Diagnosis not present

## 2011-06-28 DIAGNOSIS — J029 Acute pharyngitis, unspecified: Secondary | ICD-10-CM | POA: Diagnosis not present

## 2011-07-09 DIAGNOSIS — I10 Essential (primary) hypertension: Secondary | ICD-10-CM | POA: Diagnosis not present

## 2011-07-09 DIAGNOSIS — I2510 Atherosclerotic heart disease of native coronary artery without angina pectoris: Secondary | ICD-10-CM | POA: Diagnosis not present

## 2011-07-09 DIAGNOSIS — I739 Peripheral vascular disease, unspecified: Secondary | ICD-10-CM | POA: Diagnosis not present

## 2011-07-19 DIAGNOSIS — M899 Disorder of bone, unspecified: Secondary | ICD-10-CM | POA: Diagnosis not present

## 2011-07-19 DIAGNOSIS — Z1231 Encounter for screening mammogram for malignant neoplasm of breast: Secondary | ICD-10-CM | POA: Diagnosis not present

## 2011-07-19 DIAGNOSIS — M949 Disorder of cartilage, unspecified: Secondary | ICD-10-CM | POA: Diagnosis not present

## 2011-08-01 DIAGNOSIS — Z79899 Other long term (current) drug therapy: Secondary | ICD-10-CM | POA: Diagnosis not present

## 2011-08-23 DIAGNOSIS — R110 Nausea: Secondary | ICD-10-CM | POA: Diagnosis not present

## 2011-08-23 DIAGNOSIS — J019 Acute sinusitis, unspecified: Secondary | ICD-10-CM | POA: Diagnosis not present

## 2011-08-28 DIAGNOSIS — R0789 Other chest pain: Secondary | ICD-10-CM | POA: Diagnosis not present

## 2011-08-28 DIAGNOSIS — R0602 Shortness of breath: Secondary | ICD-10-CM | POA: Diagnosis not present

## 2011-08-28 DIAGNOSIS — I10 Essential (primary) hypertension: Secondary | ICD-10-CM | POA: Diagnosis not present

## 2011-08-28 DIAGNOSIS — J069 Acute upper respiratory infection, unspecified: Secondary | ICD-10-CM | POA: Diagnosis not present

## 2011-09-04 DIAGNOSIS — R229 Localized swelling, mass and lump, unspecified: Secondary | ICD-10-CM | POA: Diagnosis not present

## 2011-09-04 DIAGNOSIS — R220 Localized swelling, mass and lump, head: Secondary | ICD-10-CM | POA: Diagnosis not present

## 2011-09-04 DIAGNOSIS — M79609 Pain in unspecified limb: Secondary | ICD-10-CM | POA: Diagnosis not present

## 2011-09-17 DIAGNOSIS — N390 Urinary tract infection, site not specified: Secondary | ICD-10-CM | POA: Diagnosis not present

## 2011-09-19 DIAGNOSIS — R52 Pain, unspecified: Secondary | ICD-10-CM | POA: Diagnosis not present

## 2011-09-19 DIAGNOSIS — R0609 Other forms of dyspnea: Secondary | ICD-10-CM | POA: Diagnosis not present

## 2011-09-19 DIAGNOSIS — Z885 Allergy status to narcotic agent status: Secondary | ICD-10-CM | POA: Diagnosis not present

## 2011-09-19 DIAGNOSIS — R0989 Other specified symptoms and signs involving the circulatory and respiratory systems: Secondary | ICD-10-CM | POA: Diagnosis not present

## 2011-09-19 DIAGNOSIS — Z880 Allergy status to penicillin: Secondary | ICD-10-CM | POA: Diagnosis not present

## 2011-09-19 DIAGNOSIS — I10 Essential (primary) hypertension: Secondary | ICD-10-CM | POA: Diagnosis not present

## 2011-09-19 DIAGNOSIS — Z9889 Other specified postprocedural states: Secondary | ICD-10-CM | POA: Diagnosis not present

## 2011-09-19 DIAGNOSIS — R42 Dizziness and giddiness: Secondary | ICD-10-CM | POA: Diagnosis not present

## 2011-09-19 DIAGNOSIS — I2510 Atherosclerotic heart disease of native coronary artery without angina pectoris: Secondary | ICD-10-CM | POA: Diagnosis not present

## 2011-09-19 DIAGNOSIS — Z888 Allergy status to other drugs, medicaments and biological substances status: Secondary | ICD-10-CM | POA: Diagnosis not present

## 2011-09-19 DIAGNOSIS — Z91041 Radiographic dye allergy status: Secondary | ICD-10-CM | POA: Diagnosis not present

## 2011-09-19 DIAGNOSIS — Z882 Allergy status to sulfonamides status: Secondary | ICD-10-CM | POA: Diagnosis not present

## 2011-09-19 DIAGNOSIS — I059 Rheumatic mitral valve disease, unspecified: Secondary | ICD-10-CM | POA: Diagnosis not present

## 2011-09-19 DIAGNOSIS — Z79899 Other long term (current) drug therapy: Secondary | ICD-10-CM | POA: Diagnosis not present

## 2011-09-19 DIAGNOSIS — Z7982 Long term (current) use of aspirin: Secondary | ICD-10-CM | POA: Diagnosis not present

## 2011-09-19 DIAGNOSIS — Z7902 Long term (current) use of antithrombotics/antiplatelets: Secondary | ICD-10-CM | POA: Diagnosis not present

## 2011-09-19 DIAGNOSIS — M542 Cervicalgia: Secondary | ICD-10-CM | POA: Diagnosis not present

## 2011-09-26 DIAGNOSIS — M542 Cervicalgia: Secondary | ICD-10-CM | POA: Diagnosis not present

## 2011-09-26 DIAGNOSIS — J209 Acute bronchitis, unspecified: Secondary | ICD-10-CM | POA: Diagnosis not present

## 2011-09-30 ENCOUNTER — Encounter: Payer: Self-pay | Admitting: Vascular Surgery

## 2011-09-30 DIAGNOSIS — I10 Essential (primary) hypertension: Secondary | ICD-10-CM | POA: Diagnosis not present

## 2011-09-30 DIAGNOSIS — E785 Hyperlipidemia, unspecified: Secondary | ICD-10-CM | POA: Diagnosis not present

## 2011-10-01 ENCOUNTER — Encounter: Payer: Self-pay | Admitting: Vascular Surgery

## 2011-10-02 ENCOUNTER — Ambulatory Visit (INDEPENDENT_AMBULATORY_CARE_PROVIDER_SITE_OTHER): Payer: Medicare Other | Admitting: Vascular Surgery

## 2011-10-02 ENCOUNTER — Encounter: Payer: Self-pay | Admitting: Vascular Surgery

## 2011-10-02 VITALS — BP 139/79 | HR 67 | Temp 98.1°F | Ht 64.0 in | Wt 167.0 lb

## 2011-10-02 DIAGNOSIS — I708 Atherosclerosis of other arteries: Secondary | ICD-10-CM | POA: Diagnosis not present

## 2011-10-02 DIAGNOSIS — I771 Stricture of artery: Secondary | ICD-10-CM | POA: Insufficient documentation

## 2011-10-02 NOTE — Progress Notes (Signed)
[facility]  Patient name: Traci [name] MRN: [id number] DOB: Apr 10, 1946 Sex: female  REASON FOR VISIT: Left supraclavicular swelling.  HPI: Traci [name] is a 66 y.o. female who had undergone previous attempts at subclavian stenting in the past by Dr. Susa Griffins but ultimately presented with continued problems with dizziness and left arm symptoms. She had a left carotid to subclavian bypass with a 7 mm Dacron graft on 01/23/2010. I last saw her in February of 2012. At that time she had complained of some intermittent swelling in the left supraclavicular area. I suspected that she has mild lymphedema.  She states that she has had some swelling in this area off and on but approximately 2 weeks ago she had significant swelling which was tender. This brought her to the emergency department in [place]. She underwent an extensive workup there including a duplex scan which showed that the left subclavian and common carotid artery were widely patent without evidence of stenosis. The graft was patent.  In addition she had a CT scan which showed no evidence of hematoma or evidence of pseudoaneurysm. There was no good explanation for her swelling. Since this time her swelling has improved. He states that it swollen in the morning when she wakes up sometimes gets worse during the day. There is nothing really that causes the swelling to go down that she is aware of.  Past Medical History  Diagnosis Date  . Hypertension   . Hyperlipidemia   . Coronary artery disease   . GERD (gastroesophageal reflux disease)   . Mitral valve prolapse   . Osteopenia   . Sigmoid diverticulitis   . Peripheral vascular disease   . Atrial fibrillation     Family History  Problem Relation Age of Onset  . Heart disease Father   . Kidney disease Father   . Heart attack Father   . Diabetes Mother   . Diabetes Maternal Grandmother   . Heart disease Paternal  Grandfather     SOCIAL HISTORY: History  Substance Use Topics  . Smoking status: Former Smoker    Types: Cigarettes    Quit date: 09/30/1993  . Smokeless tobacco: Never Used  . Alcohol Use: No    Allergies  Allergen Reactions  . Cortisone Anaphylaxis  . Dilaudid (Hydromorphone Hcl) Nausea And Vomiting    Pt states she will start vomiting immediately for 6 hours  . Iodine Anaphylaxis  . Medrol (Methylprednisolone) Anaphylaxis  . Omnipaque (Iohexol) Anaphylaxis  . Prednisone Anaphylaxis  . Shellfish Allergy Anaphylaxis  . Sulfa Drugs Cross Reactors Anaphylaxis    Current Outpatient Prescriptions  Medication Sig Dispense Refill  . aspirin 81 MG tablet Take 81 mg by mouth daily.      Marland Kitchen atenolol (TENORMIN) 25 MG tablet Take 25 mg by mouth daily.      Marland Kitchen atorvastatin (LIPITOR) 10 MG tablet Take 10 mg by mouth daily.      . candesartan (ATACAND) 8 MG tablet Take 8 mg by mouth daily.      . Cholecalciferol (VITAMIN D3) 2000 UNITS TABS Take 2,000 mg by mouth daily.      . clopidogrel (PLAVIX) 75 MG tablet Take 75 mg by mouth daily.      . clorazepate (TRANXENE) 7.5 MG tablet Take 7.5 mg by mouth daily.      . Cyanocobalamin (VITAMIN B 12 PO) Take 1,000 mg by mouth daily.      Marland Kitchen dexlansoprazole (DEXILANT) 60 MG capsule Take 60  mg by mouth daily.      Marland Kitchen estrogens, conjugated, (PREMARIN) 0.625 MG tablet Take 0.625 mg by mouth daily. Take daily for 21 days then do not take for 7 days.      . hydrochlorothiazide (HYDRODIURIL) 25 MG tablet Take 25 mg by mouth daily.      . meclizine (ANTIVERT) 25 MG tablet Take 25 mg by mouth daily.      . Multiple Vitamin (MULTIVITAMIN) tablet Take 1 tablet by mouth daily.      . sucralfate (CARAFATE) 1 G tablet Take 1 g by mouth 4 (four) times daily.      Marland Kitchen zolpidem (AMBIEN) 10 MG tablet Take 10 mg by mouth at bedtime as needed.      . potassium chloride (KLOR-CON) 20 MEQ packet Take 20 mEq by mouth daily. Take 1/2 tablet daily        REVIEW OF  SYSTEMS: Arly.Keller ] denotes positive finding; [  ] denotes negative finding  CARDIOVASCULAR:  [ ]  chest pain   [ ]  chest pressure   Arly.Keller ] palpitations   Arly.Keller ] orthopnea   Arly.Keller ] dyspnea on exertion   [ ]  claudication   [ ]  rest pain   [ ]  DVT   [ ]  phlebitis PULMONARY:   [ ]  productive cough   [ ]  asthma   [ ]  wheezing NEUROLOGIC:   [ ]  weakness  [ ]  paresthesias  [ ]  aphasia  [ ]  amaurosis  Arly.Keller ] dizziness HEMATOLOGIC:   [ ]  bleeding problems   [ ]  clotting disorders MUSCULOSKELETAL:  [ ]  joint pain   [ ]  joint swelling [ ]  leg swelling GASTROINTESTINAL: [ ]   blood in stool  [ ]   hematemesis GENITOURINARY:  [ ]   dysuria  [ ]   hematuria PSYCHIATRIC:  [ ]  history of major depression INTEGUMENTARY:  [ ]  rashes  [ ]  ulcers CONSTITUTIONAL:  [ ]  fever   [ ]  chills  PHYSICAL EXAM: Filed Vitals:   10/02/11 0923  BP: 139/79  Pulse: 67  Temp: 98.1 F (36.7 C)  TempSrc: Oral  Height: 5\' 4"  (1.626 m)  Weight: 167 lb (75.751 kg)   Body mass index is 28.67 kg/(m^2). GENERAL: The patient is a well-nourished female, in no acute distress. The vital signs are documented above. CARDIOVASCULAR: There is a regular rate and rhythm without significant murmur appreciated. I do not detect carotid bruits. She has palpable radial pulses bilaterally. She has palpable femoral pulses and palpable posterior tibial pulses bilaterally. PULMONARY: There is good air exchange bilaterally without wheezing or rales. ABDOMEN: Soft and non-tender with normal pitched bowel sounds.  MUSCULOSKELETAL: There are no major deformities or cyanosis in the extremities. She does have some mild left supraclavicular swelling which is soft. There is no palpable mass present in the left supraclavicular region. There is no pulsatile mass. There is no erythema or warmth to suggest infection. NEUROLOGIC: No focal weakness or paresthesias are detected. SKIN: There are no ulcers or rashes noted. PSYCHIATRIC: The patient has a normal affect.  DATA:  I  have reviewed her duplex scan and CT scan were done at the outlying hospital as described above. Her white blood cell count during that admission was normal at 5.2. Hemoglobin was normal at 12.8.  MEDICAL ISSUES: I think most likely the swelling in the left supraclavicular area is related to mild lymphedema. I've explained that I do not think this is anything serious or anything that will her. It could  be a chronic problem. No discrete lymphocele was seen on her CT scan or ultrasound. I'll see her back in 6 months with a follow up duplex scan. She knows to call sooner she has problems.  Tashae Inda S [facility]: [phone]

## 2011-10-08 DIAGNOSIS — N390 Urinary tract infection, site not specified: Secondary | ICD-10-CM | POA: Diagnosis not present

## 2011-10-08 DIAGNOSIS — N952 Postmenopausal atrophic vaginitis: Secondary | ICD-10-CM | POA: Diagnosis not present

## 2011-10-10 DIAGNOSIS — Z1212 Encounter for screening for malignant neoplasm of rectum: Secondary | ICD-10-CM | POA: Diagnosis not present

## 2011-10-10 DIAGNOSIS — Z23 Encounter for immunization: Secondary | ICD-10-CM | POA: Diagnosis not present

## 2011-10-10 DIAGNOSIS — Z0000 Encounter for general adult medical examination without abnormal findings: Secondary | ICD-10-CM | POA: Diagnosis not present

## 2011-10-10 DIAGNOSIS — Z7189 Other specified counseling: Secondary | ICD-10-CM | POA: Diagnosis not present

## 2011-10-10 DIAGNOSIS — Z136 Encounter for screening for cardiovascular disorders: Secondary | ICD-10-CM | POA: Diagnosis not present

## 2011-10-10 DIAGNOSIS — Z124 Encounter for screening for malignant neoplasm of cervix: Secondary | ICD-10-CM | POA: Diagnosis not present

## 2011-11-05 DIAGNOSIS — I771 Stricture of artery: Secondary | ICD-10-CM

## 2011-11-05 DIAGNOSIS — I708 Atherosclerosis of other arteries: Secondary | ICD-10-CM

## 2011-11-05 DIAGNOSIS — I701 Atherosclerosis of renal artery: Secondary | ICD-10-CM | POA: Diagnosis not present

## 2011-11-05 DIAGNOSIS — I10 Essential (primary) hypertension: Secondary | ICD-10-CM | POA: Diagnosis not present

## 2011-11-05 DIAGNOSIS — I739 Peripheral vascular disease, unspecified: Secondary | ICD-10-CM | POA: Diagnosis not present

## 2011-11-05 HISTORY — DX: Peripheral vascular disease, unspecified: I73.9

## 2011-11-13 DIAGNOSIS — I739 Peripheral vascular disease, unspecified: Secondary | ICD-10-CM | POA: Diagnosis not present

## 2011-11-13 DIAGNOSIS — I10 Essential (primary) hypertension: Secondary | ICD-10-CM | POA: Diagnosis not present

## 2011-11-13 DIAGNOSIS — E782 Mixed hyperlipidemia: Secondary | ICD-10-CM | POA: Diagnosis not present

## 2012-01-16 DIAGNOSIS — J019 Acute sinusitis, unspecified: Secondary | ICD-10-CM | POA: Diagnosis not present

## 2012-02-26 DIAGNOSIS — R221 Localized swelling, mass and lump, neck: Secondary | ICD-10-CM | POA: Diagnosis not present

## 2012-02-26 DIAGNOSIS — Z881 Allergy status to other antibiotic agents status: Secondary | ICD-10-CM | POA: Diagnosis not present

## 2012-02-26 DIAGNOSIS — Z888 Allergy status to other drugs, medicaments and biological substances status: Secondary | ICD-10-CM | POA: Diagnosis not present

## 2012-02-26 DIAGNOSIS — Z79899 Other long term (current) drug therapy: Secondary | ICD-10-CM | POA: Diagnosis not present

## 2012-02-26 DIAGNOSIS — Z91013 Allergy to seafood: Secondary | ICD-10-CM | POA: Diagnosis not present

## 2012-02-26 DIAGNOSIS — Z7982 Long term (current) use of aspirin: Secondary | ICD-10-CM | POA: Diagnosis not present

## 2012-02-26 DIAGNOSIS — Z882 Allergy status to sulfonamides status: Secondary | ICD-10-CM | POA: Diagnosis not present

## 2012-02-26 DIAGNOSIS — Z885 Allergy status to narcotic agent status: Secondary | ICD-10-CM | POA: Diagnosis not present

## 2012-02-26 DIAGNOSIS — R220 Localized swelling, mass and lump, head: Secondary | ICD-10-CM | POA: Diagnosis not present

## 2012-02-26 DIAGNOSIS — Z91041 Radiographic dye allergy status: Secondary | ICD-10-CM | POA: Diagnosis not present

## 2012-02-26 DIAGNOSIS — I10 Essential (primary) hypertension: Secondary | ICD-10-CM | POA: Diagnosis not present

## 2012-02-26 DIAGNOSIS — T783XXA Angioneurotic edema, initial encounter: Secondary | ICD-10-CM | POA: Diagnosis not present

## 2012-02-28 DIAGNOSIS — J3089 Other allergic rhinitis: Secondary | ICD-10-CM | POA: Diagnosis not present

## 2012-02-28 DIAGNOSIS — R609 Edema, unspecified: Secondary | ICD-10-CM | POA: Diagnosis not present

## 2012-03-27 DIAGNOSIS — T25029A Burn of unspecified degree of unspecified foot, initial encounter: Secondary | ICD-10-CM | POA: Diagnosis not present

## 2012-04-01 ENCOUNTER — Encounter: Payer: Self-pay | Admitting: Neurosurgery

## 2012-04-02 ENCOUNTER — Other Ambulatory Visit (INDEPENDENT_AMBULATORY_CARE_PROVIDER_SITE_OTHER): Payer: Medicare Other | Admitting: *Deleted

## 2012-04-02 ENCOUNTER — Encounter: Payer: Self-pay | Admitting: Neurosurgery

## 2012-04-02 ENCOUNTER — Ambulatory Visit (INDEPENDENT_AMBULATORY_CARE_PROVIDER_SITE_OTHER): Payer: Medicare Other | Admitting: Neurosurgery

## 2012-04-02 VITALS — BP 120/60 | HR 56 | Resp 16 | Ht 64.0 in | Wt 159.5 lb

## 2012-04-02 DIAGNOSIS — I739 Peripheral vascular disease, unspecified: Secondary | ICD-10-CM

## 2012-04-02 DIAGNOSIS — Z48812 Encounter for surgical aftercare following surgery on the circulatory system: Secondary | ICD-10-CM | POA: Diagnosis not present

## 2012-04-02 DIAGNOSIS — I771 Stricture of artery: Secondary | ICD-10-CM | POA: Diagnosis not present

## 2012-04-02 DIAGNOSIS — I708 Atherosclerosis of other arteries: Secondary | ICD-10-CM | POA: Insufficient documentation

## 2012-04-02 DIAGNOSIS — M79609 Pain in unspecified limb: Secondary | ICD-10-CM | POA: Diagnosis not present

## 2012-04-02 NOTE — Progress Notes (Signed)
Subjective:     Patient ID: Traci [name], female   DOB: 1945/09/17, 66 y.o.   MRN: [id number]  HPI: 66 year old female patient of Dr. Edilia Bo followed for a history of a left carotid to subclavian bypass with 7 mm Dacron graft in August 2011. Dr. Edilia Bo saw the patient last and April 2013 and requested a six-month followup for which she was seen today. The patient denies any left upper extremity arm pain or difficulty with function however she does state she gets "tender" over her left subclavian area. The patient states this is transient and doesn't really bother her.   Review of Systems: 12 point review of systems is notable for the difficulties described above otherwise unremarkable     Objective:   Physical Exam: Afebrile, vital signs are stable, there is no swelling on the left subclavian line, left upper extremity has 3+ radial pulse no redness or swelling.     Assessment:     Status post left carotid subclavian bypass in 2011 doing well. Duplex today shows widely patent left common carotid subclavian artery bypass graft, left brachial ulnar and radial arteries are within normal limits    Plan:     The patient will followup in 6 months with repeat duplex and see Dr. Edilia Bo. The patient's questions were encouraged and answered, she is in agreement with this plan.  Lauree Chandler ANP  Clinic M.D.: [place]

## 2012-04-03 NOTE — Addendum Note (Signed)
Addended by: Sharee Pimple on: 04/03/2012 07:53 AM   Modules accepted: Orders

## 2012-04-09 DIAGNOSIS — E785 Hyperlipidemia, unspecified: Secondary | ICD-10-CM | POA: Diagnosis not present

## 2012-04-09 DIAGNOSIS — Z79899 Other long term (current) drug therapy: Secondary | ICD-10-CM | POA: Diagnosis not present

## 2012-04-09 DIAGNOSIS — I10 Essential (primary) hypertension: Secondary | ICD-10-CM | POA: Diagnosis not present

## 2012-04-13 DIAGNOSIS — G4700 Insomnia, unspecified: Secondary | ICD-10-CM | POA: Diagnosis not present

## 2012-04-13 DIAGNOSIS — I2510 Atherosclerotic heart disease of native coronary artery without angina pectoris: Secondary | ICD-10-CM | POA: Diagnosis not present

## 2012-04-13 DIAGNOSIS — J069 Acute upper respiratory infection, unspecified: Secondary | ICD-10-CM | POA: Diagnosis not present

## 2012-04-13 DIAGNOSIS — E785 Hyperlipidemia, unspecified: Secondary | ICD-10-CM | POA: Diagnosis not present

## 2012-04-13 DIAGNOSIS — I10 Essential (primary) hypertension: Secondary | ICD-10-CM | POA: Diagnosis not present

## 2012-04-23 DIAGNOSIS — I739 Peripheral vascular disease, unspecified: Secondary | ICD-10-CM | POA: Diagnosis not present

## 2012-04-23 DIAGNOSIS — I2510 Atherosclerotic heart disease of native coronary artery without angina pectoris: Secondary | ICD-10-CM | POA: Diagnosis not present

## 2012-04-23 DIAGNOSIS — I10 Essential (primary) hypertension: Secondary | ICD-10-CM | POA: Diagnosis not present

## 2012-05-27 DIAGNOSIS — J019 Acute sinusitis, unspecified: Secondary | ICD-10-CM | POA: Diagnosis not present

## 2012-06-06 DIAGNOSIS — R220 Localized swelling, mass and lump, head: Secondary | ICD-10-CM | POA: Diagnosis not present

## 2012-06-06 DIAGNOSIS — R221 Localized swelling, mass and lump, neck: Secondary | ICD-10-CM | POA: Diagnosis not present

## 2012-06-06 DIAGNOSIS — I2510 Atherosclerotic heart disease of native coronary artery without angina pectoris: Secondary | ICD-10-CM | POA: Diagnosis not present

## 2012-07-07 DIAGNOSIS — J069 Acute upper respiratory infection, unspecified: Secondary | ICD-10-CM | POA: Diagnosis not present

## 2012-07-14 DIAGNOSIS — I898 Other specified noninfective disorders of lymphatic vessels and lymph nodes: Secondary | ICD-10-CM | POA: Diagnosis not present

## 2012-07-14 DIAGNOSIS — T82898A Other specified complication of vascular prosthetic devices, implants and grafts, initial encounter: Secondary | ICD-10-CM | POA: Diagnosis not present

## 2012-07-22 DIAGNOSIS — I898 Other specified noninfective disorders of lymphatic vessels and lymph nodes: Secondary | ICD-10-CM | POA: Diagnosis not present

## 2012-07-22 DIAGNOSIS — R110 Nausea: Secondary | ICD-10-CM | POA: Diagnosis not present

## 2012-07-22 DIAGNOSIS — R21 Rash and other nonspecific skin eruption: Secondary | ICD-10-CM | POA: Diagnosis not present

## 2012-07-22 DIAGNOSIS — K219 Gastro-esophageal reflux disease without esophagitis: Secondary | ICD-10-CM | POA: Diagnosis not present

## 2012-07-22 DIAGNOSIS — R197 Diarrhea, unspecified: Secondary | ICD-10-CM | POA: Diagnosis not present

## 2012-07-27 DIAGNOSIS — R110 Nausea: Secondary | ICD-10-CM | POA: Diagnosis not present

## 2012-07-27 DIAGNOSIS — K219 Gastro-esophageal reflux disease without esophagitis: Secondary | ICD-10-CM | POA: Diagnosis not present

## 2012-07-27 DIAGNOSIS — R630 Anorexia: Secondary | ICD-10-CM | POA: Diagnosis not present

## 2012-07-27 DIAGNOSIS — R1012 Left upper quadrant pain: Secondary | ICD-10-CM | POA: Diagnosis not present

## 2012-07-27 DIAGNOSIS — B1910 Unspecified viral hepatitis B without hepatic coma: Secondary | ICD-10-CM | POA: Diagnosis not present

## 2012-07-28 ENCOUNTER — Encounter: Payer: Self-pay | Admitting: Pharmacist Clinician (PhC)/ Clinical Pharmacy Specialist

## 2012-08-04 DIAGNOSIS — R110 Nausea: Secondary | ICD-10-CM | POA: Diagnosis not present

## 2012-08-04 DIAGNOSIS — R109 Unspecified abdominal pain: Secondary | ICD-10-CM | POA: Diagnosis not present

## 2012-08-04 DIAGNOSIS — K219 Gastro-esophageal reflux disease without esophagitis: Secondary | ICD-10-CM | POA: Diagnosis not present

## 2012-08-06 ENCOUNTER — Emergency Department (HOSPITAL_COMMUNITY): Payer: Medicare Other

## 2012-08-06 ENCOUNTER — Encounter (HOSPITAL_COMMUNITY): Payer: Self-pay | Admitting: Emergency Medicine

## 2012-08-06 ENCOUNTER — Emergency Department (HOSPITAL_COMMUNITY)
Admission: EM | Admit: 2012-08-06 | Discharge: 2012-08-06 | Disposition: A | Discharge status: Home / Self Care | Payer: Medicare Other | Attending: Emergency Medicine | Admitting: Emergency Medicine

## 2012-08-06 DIAGNOSIS — M899 Disorder of bone, unspecified: Secondary | ICD-10-CM | POA: Insufficient documentation

## 2012-08-06 DIAGNOSIS — E785 Hyperlipidemia, unspecified: Secondary | ICD-10-CM | POA: Insufficient documentation

## 2012-08-06 DIAGNOSIS — K219 Gastro-esophageal reflux disease without esophagitis: Secondary | ICD-10-CM | POA: Insufficient documentation

## 2012-08-06 DIAGNOSIS — Z79899 Other long term (current) drug therapy: Secondary | ICD-10-CM | POA: Insufficient documentation

## 2012-08-06 DIAGNOSIS — I739 Peripheral vascular disease, unspecified: Secondary | ICD-10-CM | POA: Diagnosis not present

## 2012-08-06 DIAGNOSIS — Z7982 Long term (current) use of aspirin: Secondary | ICD-10-CM | POA: Insufficient documentation

## 2012-08-06 DIAGNOSIS — R0602 Shortness of breath: Secondary | ICD-10-CM | POA: Insufficient documentation

## 2012-08-06 DIAGNOSIS — R5381 Other malaise: Secondary | ICD-10-CM | POA: Diagnosis not present

## 2012-08-06 DIAGNOSIS — Z87891 Personal history of nicotine dependence: Secondary | ICD-10-CM | POA: Insufficient documentation

## 2012-08-06 DIAGNOSIS — Z8719 Personal history of other diseases of the digestive system: Secondary | ICD-10-CM | POA: Insufficient documentation

## 2012-08-06 DIAGNOSIS — R079 Chest pain, unspecified: Secondary | ICD-10-CM

## 2012-08-06 DIAGNOSIS — Z8679 Personal history of other diseases of the circulatory system: Secondary | ICD-10-CM | POA: Diagnosis not present

## 2012-08-06 DIAGNOSIS — R61 Generalized hyperhidrosis: Secondary | ICD-10-CM | POA: Diagnosis not present

## 2012-08-06 DIAGNOSIS — R0789 Other chest pain: Secondary | ICD-10-CM | POA: Diagnosis not present

## 2012-08-06 DIAGNOSIS — R9431 Abnormal electrocardiogram [ECG] [EKG]: Secondary | ICD-10-CM | POA: Insufficient documentation

## 2012-08-06 DIAGNOSIS — I2510 Atherosclerotic heart disease of native coronary artery without angina pectoris: Secondary | ICD-10-CM | POA: Diagnosis not present

## 2012-08-06 DIAGNOSIS — I10 Essential (primary) hypertension: Secondary | ICD-10-CM | POA: Diagnosis not present

## 2012-08-06 DIAGNOSIS — Z9889 Other specified postprocedural states: Secondary | ICD-10-CM | POA: Insufficient documentation

## 2012-08-06 LAB — CBC WITH DIFFERENTIAL/PLATELET
Basophils Absolute: 0 10*3/uL (ref 0.0–0.1)
Basophils Relative: 1 % (ref 0–1)
Eosinophils Absolute: 0.1 10*3/uL (ref 0.0–0.7)
Eosinophils Relative: 2 % (ref 0–5)
HCT: 38.2 % (ref 36.0–46.0)
Hemoglobin: 13 g/dL (ref 12.0–15.0)
MCH: 29.1 pg (ref 26.0–34.0)
MCHC: 34 g/dL (ref 30.0–36.0)
MCV: 85.7 fL (ref 78.0–100.0)
Monocytes Absolute: 0.3 10*3/uL (ref 0.1–1.0)
Monocytes Relative: 5 % (ref 3–12)
RDW: 12.3 % (ref 11.5–15.5)

## 2012-08-06 LAB — POCT I-STAT TROPONIN I: Troponin i, poc: 0 ng/mL (ref 0.00–0.08)

## 2012-08-06 LAB — BASIC METABOLIC PANEL
BUN: 16 mg/dL (ref 6–23)
Chloride: 100 mEq/L (ref 96–112)
Creatinine, Ser: 0.82 mg/dL (ref 0.50–1.10)
Glucose, Bld: 103 mg/dL — ABNORMAL HIGH (ref 70–99)
Potassium: 5 mEq/L (ref 3.5–5.1)

## 2012-08-06 MED ORDER — ACETAMINOPHEN 325 MG PO TABS
650.0000 mg | ORAL_TABLET | Freq: Once | ORAL | Status: AC
  Administered 2012-08-06: 650 mg via ORAL
  Filled 2012-08-06: qty 2

## 2012-08-06 MED ORDER — NITROGLYCERIN 2 % TD OINT
0.5000 [in_us] | TOPICAL_OINTMENT | Freq: Once | TRANSDERMAL | Status: AC
  Administered 2012-08-06: 0.5 [in_us] via TOPICAL
  Filled 2012-08-06: qty 1

## 2012-08-06 NOTE — ED Notes (Signed)
Per EMS - pt c/o intemittent stabbing CP that radiates to left shoulder. Pt woke up and then started having the pain. EMS administered a total of 3 Nitro and 3 ASA, pt took 1 ASA at home pta. CP was a 10/10, now a 6/10. BP 148/66 HR 66, RR 16 100% on 4L/min [place]. 12 lead unremarkable. EMS started a 20G in right hand. Pt has had stent placed.

## 2012-08-06 NOTE — ED Provider Notes (Signed)
67 year old female had onset this morning when she woke up of a sharp left inframammary pain without radiation. In the ambulance coming in, she received 3 nitroglycerin with complete relief of pain. She has been pain-free since then. Review of her past records shows that she had a catheterization in 2009 showing single-vessel coronary artery disease with a 50% lesion. She has not been able to have repeat catheterizations because of severe reactions to the dye. On exam, lungs are clear and heart has regular rate and rhythm. She has no peripheral edema. She will be observed in the ED with serial troponins and if negative refer back to her cardiologist for outpatient stress testing.   Date: 08/06/2012  Rate: 60  Rhythm: normal sinus rhythm  QRS Axis: normal  Intervals: normal  ST/T Wave abnormalities: normal  Conduction Disutrbances:none  Narrative Interpretation: Normal ECG. No change from prior ECG.  Old EKG Reviewed: unchanged  I saw and evaluated the patient, reviewed the resident's note and I agree with the findings and plan.   Dione Booze, MD 08/06/12 [phone]

## 2012-08-06 NOTE — ED Notes (Addendum)
Pt given discharge paperwork; no additional questions by pt regarding discharge; e-signature obtained; VSS;

## 2012-08-06 NOTE — ED Notes (Signed)
Pt reports she woke up this morning and noticed she had a stabbing feeling in left chest. She took an ASA and it didn't go away so she called EMS. Pt reports the pain radiated to shoulder blade. After 3 nitro pt reports she no longer has CP, but has a HA.

## 2012-08-06 NOTE — ED Provider Notes (Signed)
Assumed care from Dr. Micheline Maze at [phone].  Please see her note for full details.  In short, Traci [name] is a 67 y.o. female with history of CAD and multiple stents who presents to the ED with SSCP.  Will do three troponin rule out.  Patient now chest pain free.    8:10 PM: Patient reevaluated.  Pain free.  Trope negative.  Patient safe for discharge.  Patient discharged.   Arloa Koh, MD 08/06/12 [phone]

## 2012-08-06 NOTE — ED Provider Notes (Signed)
History     CSN: [id number]  Arrival date & time 08/06/12  5621   First MD Initiated Contact with Patient 08/06/12 1017      Chief Complaint  Patient presents with  . Chest Pain    (Consider location/radiation/quality/duration/timing/severity/associated sxs/prior treatment) Patient is a 67 y.o. female presenting with chest pain. The history is provided by the patient. No language interpreter was used.  Chest Pain Pain location:  L chest Pain quality: sharp   Pain radiates to:  Upper back Pain radiates to the back: yes   Pain severity:  Moderate Onset quality:  Unable to specify Duration:  2 hours Timing:  Constant Progression:  Resolved Chronicity:  Recurrent Context: at rest   Relieved by:  Nitroglycerin Worsened by:  Nothing tried Associated symptoms: diaphoresis and shortness of breath   Associated symptoms: no abdominal pain, no back pain, no cough, no dizziness, no fatigue, no fever, no headache, no nausea, no numbness, no palpitations, no syncope, not vomiting and no weakness   Risk factors: coronary artery disease     Past Medical History  Diagnosis Date  . Hypertension 11/30/2010    echo- EF 55%; normal w/ mildly sclerotic aortic valve  . Hyperlipidemia   . Coronary artery disease   . GERD (gastroesophageal reflux disease)   . Mitral valve prolapse   . Osteopenia   . Sigmoid diverticulitis   . Peripheral vascular disease   . Atrial fibrillation   . Nonspecific ST-T wave electrocardiographic changes 03/26/2011    R/Lmv - EF 74%, normal perfusion all regions, ST depression w/ Lexiscan infusion w/o assoc angina  . CAD (coronary artery disease)   . PAD (peripheral artery disease)   . PVD (peripheral vascular disease) 11/05/2011    doppler - R/L brachial pressures essentially equal w/o inflow disease; L sublclavian/CCA bypass graft demonstrates patent flow, no evidence of significant stenosis  . Hypertension 11/05/11    renal dopplers - celiac artery and SMA >50%  diameter reduction, R renal artery - mildly elevated velocities 1-59% diameter reduction, L renal artery normal  . Claudication 01/06/2008    Lower extremity dopplers - no evidence of arterial insufficiency, normal exam    Past Surgical History  Procedure Laterality Date  . Cholecystectomy    . Appendectomy    . Abdominal hysterectomy    . Subclavian artery stents  01/23/2010    Left carotid to subclavian artery Bypass  . Spine surgery  Feb. 2011  . Coronary angioplasty  1995    X's  several  . Cardiac catheterization  09/20/2008    LSA ISR 7x19mm Cordis Genesis on Opta premount, reduction from 80% to 0%    Family History  Problem Relation Age of Onset  . Heart disease Father     Heart Disease before age 60  . Kidney disease Father   . Heart attack Father   . Hyperlipidemia Father   . Hypertension Father   . Diabetes Mother     Varicose Veins  . Diabetes Maternal Grandmother   . Heart disease Paternal Grandfather     History  Substance Use Topics  . Smoking status: Former Smoker    Types: Cigarettes    Quit date: 09/30/1993  . Smokeless tobacco: Never Used  . Alcohol Use: No    OB History   Grav Para Term Preterm Abortions TAB SAB Ect Mult Living                  Review of Systems  Constitutional:  Positive for diaphoresis. Negative for fever, chills, activity change, appetite change and fatigue.  HENT: Negative for congestion, sore throat, facial swelling, rhinorrhea, neck pain and neck stiffness.   Eyes: Negative for photophobia and discharge.  Respiratory: Positive for shortness of breath. Negative for cough and chest tightness.   Cardiovascular: Positive for chest pain. Negative for palpitations, leg swelling and syncope.  Gastrointestinal: Negative for nausea, vomiting, abdominal pain and diarrhea.  Endocrine: Negative for polydipsia and polyuria.  Genitourinary: Negative for dysuria, frequency, difficulty urinating and pelvic pain.  Musculoskeletal: Negative  for back pain and arthralgias.  Skin: Negative for color change and wound.  Allergic/Immunologic: Negative for immunocompromised state.  Neurological: Negative for dizziness, facial asymmetry, weakness, numbness and headaches.  Hematological: Does not bruise/bleed easily.  Psychiatric/Behavioral: Negative for confusion and agitation.    Allergies  Cortisone; Dilaudid; Iodine; Medrol; Omnipaque; Prednisone; Shellfish allergy; Sulfa drugs cross reactors; Codeine; and Erythromycin  Home Medications   Current Outpatient Rx  Name  Route  Sig  Dispense  Refill  . aspirin EC 81 MG tablet   Oral   Take 81 mg by mouth daily.         Marland Kitchen atenolol (TENORMIN) 25 MG tablet   Oral   Take 25 mg by mouth daily.         Marland Kitchen atorvastatin (LIPITOR) 10 MG tablet   Oral   Take 10 mg by mouth every evening.          . candesartan (ATACAND) 8 MG tablet   Oral   Take 8 mg by mouth every evening.          . Cholecalciferol (VITAMIN D3) 2000 UNITS TABS   Oral   Take 2,000 mg by mouth daily.         . clopidogrel (PLAVIX) 75 MG tablet   Oral   Take 75 mg by mouth daily.         . clorazepate (TRANXENE) 7.5 MG tablet   Oral   Take 7.5 mg by mouth daily.         . cyanocobalamin 500 MCG tablet   Oral   Take 500 mcg by mouth daily.         Marland Kitchen dexlansoprazole (DEXILANT) 60 MG capsule   Oral   Take 60 mg by mouth daily.         Marland Kitchen estrogens, conjugated, (PREMARIN) 0.625 MG tablet   Oral   Take 0.625 mg by mouth every evening. Does not take inactive pills, active pills daily         . hydrochlorothiazide (HYDRODIURIL) 25 MG tablet   Oral   Take 25 mg by mouth 3 (three) times a week. Monday, Wednesday, Friday         . meclizine (ANTIVERT) 25 MG tablet   Oral   Take 12.5 mg by mouth daily.          . Multiple Vitamin (MULTIVITAMIN WITH MINERALS) TABS   Oral   Take 1 tablet by mouth daily.         . sucralfate (CARAFATE) 1 G tablet   Oral   Take 1 g by mouth 4  (four) times daily.         Marland Kitchen thiamine (VITAMIN B-1) 100 MG tablet   Oral   Take 100 mg by mouth daily.           BP 122/57  Pulse 59  Temp(Src) 98.2 F (36.8 C) (Oral)  Resp 18  SpO2 98%  Physical Exam  Constitutional: She is oriented to person, place, and time. She appears well-developed and well-nourished. No distress.  HENT:  Head: Normocephalic and atraumatic.  Mouth/Throat: No oropharyngeal exudate.  Eyes: Pupils are equal, round, and reactive to light.  Neck: Normal range of motion. Neck supple.  Cardiovascular: Normal rate, regular rhythm and normal heart sounds.  Exam reveals no gallop and no friction rub.   No murmur heard. Pulmonary/Chest: Effort normal and breath sounds normal. No respiratory distress. She has no wheezes. She has no rales.  Abdominal: Soft. Bowel sounds are normal. She exhibits no distension and no mass. There is no tenderness. There is no rebound and no guarding.  Musculoskeletal: Normal range of motion. She exhibits no edema and no tenderness.  Neurological: She is alert and oriented to person, place, and time.  Skin: Skin is warm and dry.  Psychiatric: She has a normal mood and affect.    ED Course  Procedures (including critical care time)  Labs Reviewed  BASIC METABOLIC PANEL - Abnormal; Notable for the following:    Glucose, Bld 103 (*)    GFR calc non Af Amer 73 (*)    GFR calc Af Amer 85 (*)    All other components within normal limits  CBC WITH DIFFERENTIAL  POCT I-STAT TROPONIN I  POCT I-STAT TROPONIN I  POCT I-STAT TROPONIN I   Dg Chest 1 View  08/06/2012  *RADIOLOGY REPORT*  Clinical Data: Left chest pain and weakness.  CHEST - 1 VIEW  Comparison: 01/24/2010.  Findings: Heart size normal.  Vascular stent is seen above the transverse aorta.  Lungs are clear.  No pleural fluid.  IMPRESSION: No acute findings.   Original Report Authenticated By: Leanna Battles, M.D.      1. Chest pain at rest      Date: 08/06/2012  Rate:  60  Rhythm: normal sinus rhythm  QRS Axis: normal  Intervals: normal  ST/T Wave abnormalities: nonspecific T wave changes  Conduction Disutrbances:none  Narrative Interpretation:   Old EKG Reviewed: none available    MDM  Pt is a 67 y.o. female with pertinent PMHX of HTN, CAD, HLD, MVP, GERD, PVD, a fib who presents with CP beginning about 7:45 AM.  Pain described as sharp, under L breast w/ radiation to back w/ assoc diaphoresis and mild SOB.  Symptoms resolved after given 3SL NTG by EMS and pt is asymptomatic upon arrival.  PE benign.  Ddx includes ACS, GERD/gastritis, pna.  Plan for 3x3hr delta troponin and if negative pt can f/u with her cardiologist, Dr. Alanda Amass for possible repeat nuclear medicine scan as pt not candidate for repeat cath give dye allergy.  Care transferred to Dr. Piedad Climes at 5:30 PM w/ 2 of 3 troponins returned and negative. CXR CBC, BMP also unremarkable.     1. Chest pain at rest      Labs and imaging considered in decision making, reviewed by myself.  Imaging interpreted by radiology. Pt care discussed with my attending, Dr. Preston Fleeting.      Toy Cookey, MD 08/08/12 [phone]

## 2012-08-11 DIAGNOSIS — I2510 Atherosclerotic heart disease of native coronary artery without angina pectoris: Secondary | ICD-10-CM | POA: Diagnosis not present

## 2012-08-11 DIAGNOSIS — I10 Essential (primary) hypertension: Secondary | ICD-10-CM | POA: Diagnosis not present

## 2012-08-11 DIAGNOSIS — E782 Mixed hyperlipidemia: Secondary | ICD-10-CM | POA: Diagnosis not present

## 2012-08-12 ENCOUNTER — Other Ambulatory Visit (HOSPITAL_COMMUNITY): Payer: Self-pay | Admitting: Cardiovascular Disease

## 2012-08-12 DIAGNOSIS — R079 Chest pain, unspecified: Secondary | ICD-10-CM

## 2012-08-13 DIAGNOSIS — R110 Nausea: Secondary | ICD-10-CM | POA: Diagnosis not present

## 2012-08-13 DIAGNOSIS — K219 Gastro-esophageal reflux disease without esophagitis: Secondary | ICD-10-CM | POA: Diagnosis not present

## 2012-08-13 DIAGNOSIS — R1013 Epigastric pain: Secondary | ICD-10-CM | POA: Diagnosis not present

## 2012-08-19 ENCOUNTER — Ambulatory Visit (HOSPITAL_COMMUNITY)
Admission: RE | Admit: 2012-08-19 | Discharge: 2012-08-19 | Disposition: A | Discharge status: Home / Self Care | Payer: Medicare Other | Source: Ambulatory Visit | Attending: Cardiovascular Disease | Admitting: Cardiovascular Disease

## 2012-08-19 DIAGNOSIS — R079 Chest pain, unspecified: Secondary | ICD-10-CM | POA: Diagnosis not present

## 2012-08-19 MED ORDER — TECHNETIUM TC 99M SESTAMIBI GENERIC - CARDIOLITE
30.0000 | Freq: Once | INTRAVENOUS | Status: AC | PRN
  Administered 2012-08-19: 30 via INTRAVENOUS

## 2012-08-19 MED ORDER — TECHNETIUM TC 99M SESTAMIBI GENERIC - CARDIOLITE
10.0000 | Freq: Once | INTRAVENOUS | Status: AC | PRN
  Administered 2012-08-19: 10 via INTRAVENOUS

## 2012-08-19 NOTE — Procedures (Addendum)
[facility] [address] [place] [address] [phone]  Cardiology Nuclear Med Study  Traci [name] is a 67 y.o. female     MRN : [id number]     DOB: Sep 25, 1945  Procedure Date: 08/19/2012  Nuclear Med Background Indication for Stress Test:  Stent Patency, PTCA Patency and Abnormal EKG History:  CAD;STENT-1993,1995,09/2008;PTCA-09/2008 Cardiac Risk Factors: Family History - CAD, History of Smoking, Hypertension, Lipids, Overweight and PVD  Symptoms:  Chest Pain, Fatigue and SOB   Nuclear Pre-Procedure Caffeine/Decaff Intake:  10:00pm NPO After: 8:00am   IV Site: R Hand  IV 0.9% NS with Angio Cath:  22g  Chest Size (in):  N/A IV Started by: Emmit Pomfret, RN  Height: 5\' 4"  (1.626 m)  Cup Size: D  BMI:  Body mass index is 27.11 kg/(m^2). Weight:  158 lb (71.668 kg)   Tech Comments:  N/A    Nuclear Med Study 1 or 2 day study: 1 day  Stress Test Type:  Stress  Order Authorizing Provider:  Hazeline Junker   Resting Radionuclide: Technetium 76m Sestamibi  Resting Radionuclide Dose: 11.0 mCi   Stress Radionuclide:  Technetium 91m Sestamibi  Stress Radionuclide Dose: 31.6 mCi           Stress Protocol Rest HR: 64 Stress HR: 139  Rest BP: 152/74 Stress BP: 206/69  Exercise Time (min): 7:02 METS: 8.6   Predicted Max HR: 154 bpm % Max HR: 90.26 bpm Rate Pressure Product: 21308  Dose of Adenosine (mg):  n/a Dose of Lexiscan: n/a mg  Dose of Atropine (mg): n/a Dose of Dobutamine: n/a mcg/kg/min (at max HR)  Stress Test Technologist: Esperanza Sheets, CCT Nuclear Technologist: Koren Shiver, CNMT   Rest Procedure:  Myocardial perfusion imaging was performed at rest 45 minutes following the intravenous administration of Technetium 66m Sestamibi. Stress Procedure:  The patient performed treadmill exercise using a Bruce  Protocol for 7:02 minutes. The patient stopped due to SOB and Leg Fatigue and denied any  chest pain.  There were no significant ST-T wave changes.  Technetium 22m Sestamibi was injected at peak exercise and myocardial perfusion imaging was performed after a brief delay.  Transient Ischemic Dilatation (Normal <1.22):  0.79 Lung/Heart Ratio (Normal <0.45):  0.33 QGS EDV:  53 ml QGS ESV:  12 ml LV Ejection Fraction: 77%  Signed by       Rest ECG: NSR - Normal EKG  Stress ECG: Significant ST abnormalities consistent with ischemia.  QPS Raw Data Images:  Normal; no motion artifact; normal heart/lung ratio. Stress Images:  Normal homogeneous uptake in all areas of the myocardium. Rest Images:  Normal homogeneous uptake in all areas of the myocardium. Subtraction (SDS):  No evidence of ischemia.  Impression Exercise Capacity:  Good exercise capacity. BP Response:  Normal blood pressure response. Clinical Symptoms:  No significant symptoms noted. ECG Impression:  Significant ST abnormalities consistent with ischemia. Comparison with Prior Nuclear Study: No significant change from previous study  Overall Impression:  Normal stress nuclear study.  LV Wall Motion:  NL LV Function; NL Wall Motion   Runell Gess, MD  08/20/2012 12:54 PM

## 2012-09-09 DIAGNOSIS — K589 Irritable bowel syndrome without diarrhea: Secondary | ICD-10-CM | POA: Diagnosis not present

## 2012-09-09 DIAGNOSIS — R1084 Generalized abdominal pain: Secondary | ICD-10-CM | POA: Diagnosis not present

## 2012-09-09 DIAGNOSIS — K219 Gastro-esophageal reflux disease without esophagitis: Secondary | ICD-10-CM | POA: Diagnosis not present

## 2012-09-11 DIAGNOSIS — K219 Gastro-esophageal reflux disease without esophagitis: Secondary | ICD-10-CM | POA: Diagnosis not present

## 2012-09-11 DIAGNOSIS — J069 Acute upper respiratory infection, unspecified: Secondary | ICD-10-CM | POA: Diagnosis not present

## 2012-09-11 DIAGNOSIS — H6120 Impacted cerumen, unspecified ear: Secondary | ICD-10-CM | POA: Diagnosis not present

## 2012-09-11 DIAGNOSIS — H9190 Unspecified hearing loss, unspecified ear: Secondary | ICD-10-CM | POA: Diagnosis not present

## 2012-09-29 DIAGNOSIS — K589 Irritable bowel syndrome without diarrhea: Secondary | ICD-10-CM | POA: Diagnosis not present

## 2012-09-29 DIAGNOSIS — R1084 Generalized abdominal pain: Secondary | ICD-10-CM | POA: Diagnosis not present

## 2012-09-29 DIAGNOSIS — R143 Flatulence: Secondary | ICD-10-CM | POA: Diagnosis not present

## 2012-09-29 DIAGNOSIS — R141 Gas pain: Secondary | ICD-10-CM | POA: Diagnosis not present

## 2012-10-14 ENCOUNTER — Ambulatory Visit: Payer: Medicare Other | Admitting: Vascular Surgery

## 2012-10-17 DIAGNOSIS — R21 Rash and other nonspecific skin eruption: Secondary | ICD-10-CM | POA: Diagnosis not present

## 2012-10-20 DIAGNOSIS — E785 Hyperlipidemia, unspecified: Secondary | ICD-10-CM | POA: Diagnosis not present

## 2012-10-20 DIAGNOSIS — Z79899 Other long term (current) drug therapy: Secondary | ICD-10-CM | POA: Diagnosis not present

## 2012-10-20 DIAGNOSIS — Z1231 Encounter for screening mammogram for malignant neoplasm of breast: Secondary | ICD-10-CM | POA: Diagnosis not present

## 2012-10-22 DIAGNOSIS — I10 Essential (primary) hypertension: Secondary | ICD-10-CM | POA: Diagnosis not present

## 2012-10-22 DIAGNOSIS — I2510 Atherosclerotic heart disease of native coronary artery without angina pectoris: Secondary | ICD-10-CM | POA: Diagnosis not present

## 2012-10-22 DIAGNOSIS — R609 Edema, unspecified: Secondary | ICD-10-CM | POA: Diagnosis not present

## 2012-10-22 DIAGNOSIS — E785 Hyperlipidemia, unspecified: Secondary | ICD-10-CM | POA: Diagnosis not present

## 2012-10-22 DIAGNOSIS — L259 Unspecified contact dermatitis, unspecified cause: Secondary | ICD-10-CM | POA: Diagnosis not present

## 2012-10-22 DIAGNOSIS — G4700 Insomnia, unspecified: Secondary | ICD-10-CM | POA: Diagnosis not present

## 2012-10-27 ENCOUNTER — Encounter: Payer: Self-pay | Admitting: Cardiovascular Disease

## 2012-10-28 ENCOUNTER — Ambulatory Visit: Payer: Medicare Other | Admitting: Vascular Surgery

## 2012-10-28 DIAGNOSIS — K589 Irritable bowel syndrome without diarrhea: Secondary | ICD-10-CM | POA: Diagnosis not present

## 2012-10-28 DIAGNOSIS — R141 Gas pain: Secondary | ICD-10-CM | POA: Diagnosis not present

## 2012-10-28 DIAGNOSIS — R143 Flatulence: Secondary | ICD-10-CM | POA: Diagnosis not present

## 2012-10-29 ENCOUNTER — Ambulatory Visit: Payer: Medicare Other | Admitting: Vascular Surgery

## 2012-10-29 ENCOUNTER — Ambulatory Visit (INDEPENDENT_AMBULATORY_CARE_PROVIDER_SITE_OTHER): Payer: Medicare Other | Admitting: Vascular Surgery

## 2012-10-29 ENCOUNTER — Encounter (INDEPENDENT_AMBULATORY_CARE_PROVIDER_SITE_OTHER): Payer: Medicare Other | Admitting: *Deleted

## 2012-10-29 ENCOUNTER — Encounter: Payer: Self-pay | Admitting: Vascular Surgery

## 2012-10-29 VITALS — BP 150/79 | HR 57 | Resp 16 | Ht 64.0 in | Wt 158.0 lb

## 2012-10-29 DIAGNOSIS — I771 Stricture of artery: Secondary | ICD-10-CM

## 2012-10-29 DIAGNOSIS — G458 Other transient cerebral ischemic attacks and related syndromes: Secondary | ICD-10-CM | POA: Diagnosis not present

## 2012-10-29 DIAGNOSIS — Z48812 Encounter for surgical aftercare following surgery on the circulatory system: Secondary | ICD-10-CM

## 2012-10-29 NOTE — Progress Notes (Addendum)
[facility]  Established Previous Bypass  History of Present Illness  Traci [name] is a 67 y.o. (03-28-1946) female who presents with chief complaint: of occasional swelling.  Previous operation(s) include: left carotid to subclavian bypass with a 7 mm Dacron graft on 01/23/2010.    The patient's symptoms have not progressed.  The patient's symptoms are: dizziness and left arm symptoms of pain and numbness . The patient's treatment regimen currently included: maximal medical management and activity as tolerates.  Past Medical History, Past Surgical History, Social History, Family History, Medications, Allergies, and Review of Systems are unchanged from previous evaluation on 04/02/2013 except for bilateral feet swelling after an extended plane ride > 4.5 hours.    Physical Examination  Filed Vitals:   10/29/12 1557 10/29/12 1559  BP: 147/76 150/79  Pulse: 57 57  Resp: 16   Height: 5\' 4"  (1.626 m)   Weight: 158 lb (71.668 kg)    Body mass index is 27.11 kg/(m^2).  General: A&O x 3, WDWN,   Pulmonary: Sym exp, good air movt, CTAB, no rales, rhonchi, & wheezing,  + rales / + rhonchi / + wheezing  Cardiac: RRR,  Vascular: Vessel Right Left  Radial Palpable Palpable  Brachial Palpable Palpable  Carotid Palpable, without bruit Palpable, without bruit  Aorta Non-palpable N/A  Femoral Palpable Palpable  Popliteal Non-palpable Non-palpable  PT Palpable Palpable  DP Palpable Palpable   Audible Left subclavian by pass  Musculoskeletal: M/S 5/5 throughout, Extremities without ischemic changes.  Neurologic: Pain and light touch intact in extremities, Motor exam as listed above  Duplex exam Left carotid to subclavian by pass Patent graft with velocities of CCA 120 and graft of 254 cm/s  Medical Decision Making  Traci [name] is a 67 y.o. female who presents with:  s/p Left carotid to subclavian graft by pass .  Based on the  patient's vascular studies and examination, I have offered the patient: She will follow up as needed.  She has had no recurrent symptoms and her swelling is asymptomatic and tolerable at the left by pass site.  She was measured today for support thigh high hose and given a web site to order them on line.  Traci [name] [facility] PA-C [facility] [place] Office: [phone]   10/29/2012, 4:19 PM  Agree with above. Carotid subclavian bypass graft is patent. She has a palpable radial pulse. I will see her back as needed.  Waverly Ferrari, MD, FACS Beeper [phone] 10/29/2012

## 2012-11-20 ENCOUNTER — Other Ambulatory Visit: Payer: Self-pay

## 2012-11-20 MED ORDER — CLORAZEPATE DIPOTASSIUM 7.5 MG PO TABS: 7.5000 mg | ORAL_TABLET | Freq: Every day | ORAL | Status: DC

## 2012-11-20 NOTE — Telephone Encounter (Signed)
Rx was called in to pharmacy. 

## 2012-11-20 NOTE — Telephone Encounter (Signed)
Pharmacy called and said she had plenty of refills on the Clorazepate, current call in for refill was discontinued.

## 2012-11-26 ENCOUNTER — Other Ambulatory Visit (HOSPITAL_COMMUNITY): Payer: Self-pay | Admitting: Cardiovascular Disease

## 2012-11-26 DIAGNOSIS — I70219 Atherosclerosis of native arteries of extremities with intermittent claudication, unspecified extremity: Secondary | ICD-10-CM

## 2012-11-26 DIAGNOSIS — I2510 Atherosclerotic heart disease of native coronary artery without angina pectoris: Secondary | ICD-10-CM | POA: Diagnosis not present

## 2012-11-26 DIAGNOSIS — R011 Cardiac murmur, unspecified: Secondary | ICD-10-CM

## 2012-11-26 DIAGNOSIS — E782 Mixed hyperlipidemia: Secondary | ICD-10-CM | POA: Diagnosis not present

## 2012-11-26 DIAGNOSIS — I10 Essential (primary) hypertension: Secondary | ICD-10-CM | POA: Diagnosis not present

## 2012-12-01 DIAGNOSIS — N390 Urinary tract infection, site not specified: Secondary | ICD-10-CM | POA: Diagnosis not present

## 2012-12-01 DIAGNOSIS — Z79890 Hormone replacement therapy (postmenopausal): Secondary | ICD-10-CM | POA: Diagnosis not present

## 2012-12-06 ENCOUNTER — Emergency Department (HOSPITAL_COMMUNITY): Payer: Medicare Other

## 2012-12-06 ENCOUNTER — Encounter (HOSPITAL_COMMUNITY): Payer: Self-pay | Admitting: Physician Assistant

## 2012-12-06 ENCOUNTER — Observation Stay (HOSPITAL_COMMUNITY)
Admission: EM | Admit: 2012-12-06 | Discharge: 2012-12-07 | Disposition: A | Discharge status: Home / Self Care | Payer: Medicare Other | Attending: Cardiovascular Disease | Admitting: Cardiovascular Disease

## 2012-12-06 DIAGNOSIS — R61 Generalized hyperhidrosis: Secondary | ICD-10-CM | POA: Diagnosis not present

## 2012-12-06 DIAGNOSIS — R259 Unspecified abnormal involuntary movements: Secondary | ICD-10-CM | POA: Diagnosis not present

## 2012-12-06 DIAGNOSIS — R002 Palpitations: Secondary | ICD-10-CM | POA: Diagnosis not present

## 2012-12-06 DIAGNOSIS — I2510 Atherosclerotic heart disease of native coronary artery without angina pectoris: Secondary | ICD-10-CM

## 2012-12-06 DIAGNOSIS — J40 Bronchitis, not specified as acute or chronic: Secondary | ICD-10-CM | POA: Diagnosis not present

## 2012-12-06 DIAGNOSIS — R9431 Abnormal electrocardiogram [ECG] [EKG]: Secondary | ICD-10-CM | POA: Insufficient documentation

## 2012-12-06 DIAGNOSIS — I708 Atherosclerosis of other arteries: Secondary | ICD-10-CM | POA: Diagnosis not present

## 2012-12-06 DIAGNOSIS — E871 Hypo-osmolality and hyponatremia: Secondary | ICD-10-CM | POA: Diagnosis present

## 2012-12-06 DIAGNOSIS — R0602 Shortness of breath: Secondary | ICD-10-CM | POA: Diagnosis not present

## 2012-12-06 DIAGNOSIS — R55 Syncope and collapse: Secondary | ICD-10-CM | POA: Diagnosis present

## 2012-12-06 DIAGNOSIS — I739 Peripheral vascular disease, unspecified: Secondary | ICD-10-CM | POA: Diagnosis not present

## 2012-12-06 DIAGNOSIS — I10 Essential (primary) hypertension: Secondary | ICD-10-CM | POA: Diagnosis present

## 2012-12-06 DIAGNOSIS — E785 Hyperlipidemia, unspecified: Secondary | ICD-10-CM | POA: Diagnosis not present

## 2012-12-06 DIAGNOSIS — I771 Stricture of artery: Secondary | ICD-10-CM | POA: Diagnosis present

## 2012-12-06 DIAGNOSIS — R079 Chest pain, unspecified: Secondary | ICD-10-CM | POA: Diagnosis not present

## 2012-12-06 DIAGNOSIS — R0989 Other specified symptoms and signs involving the circulatory and respiratory systems: Secondary | ICD-10-CM | POA: Diagnosis not present

## 2012-12-06 DIAGNOSIS — R42 Dizziness and giddiness: Secondary | ICD-10-CM | POA: Diagnosis not present

## 2012-12-06 LAB — BASIC METABOLIC PANEL
BUN: 10 mg/dL (ref 6–23)
BUN: 7 mg/dL (ref 6–23)
Calcium: 8.5 mg/dL (ref 8.4–10.5)
Chloride: 98 mEq/L (ref 96–112)
Creatinine, Ser: 0.72 mg/dL (ref 0.50–1.10)
Creatinine, Ser: 0.62 mg/dL (ref 0.50–1.10)
GFR calc Af Amer: >90 mL/min (ref >90)
GFR calc non Af Amer: 88 mL/min — ABNORMAL LOW (ref >90)
GFR calc non Af Amer: >90 mL/min (ref >90)
Glucose, Bld: 125 mg/dL — ABNORMAL HIGH (ref 70–99)
Potassium: 3.6 mEq/L (ref 3.5–5.1)
Sodium: 124 mEq/L — ABNORMAL LOW (ref 135–145)

## 2012-12-06 LAB — CBC
Hemoglobin: 11.7 g/dL — ABNORMAL LOW (ref 12.0–15.0)
MCH: 29 pg (ref 26.0–34.0)
MCHC: 34.6 g/dL (ref 30.0–36.0)
MCV: 83.7 fL (ref 78.0–100.0)

## 2012-12-06 LAB — MAGNESIUM: Magnesium: 1.8 mg/dL (ref 1.5–2.5)

## 2012-12-06 LAB — POCT I-STAT TROPONIN I

## 2012-12-06 MED ORDER — CLOPIDOGREL BISULFATE 75 MG PO TABS
75.0000 mg | ORAL_TABLET | Freq: Every day | ORAL | Status: DC
  Administered 2012-12-06 – 2012-12-07 (×2): 75 mg via ORAL
  Filled 2012-12-06 (×2): qty 1

## 2012-12-06 MED ORDER — SODIUM CHLORIDE 0.9 % IV BOLUS (SEPSIS)
1000.0000 mL | Freq: Once | INTRAVENOUS | Status: AC
  Administered 2012-12-06: 1000 mL via INTRAVENOUS

## 2012-12-06 MED ORDER — ATORVASTATIN CALCIUM 10 MG PO TABS
10.0000 mg | ORAL_TABLET | Freq: Every evening | ORAL | Status: DC
  Administered 2012-12-06: 10 mg via ORAL
  Filled 2012-12-06 (×2): qty 1

## 2012-12-06 MED ORDER — ASPIRIN 325 MG PO TABS
ORAL_TABLET | ORAL | Status: AC
  Filled 2012-12-06: qty 1

## 2012-12-06 MED ORDER — ESTROGENS CONJUGATED 0.625 MG PO TABS
0.6250 mg | ORAL_TABLET | Freq: Every evening | ORAL | Status: DC
  Administered 2012-12-06: 0.625 mg via ORAL
  Filled 2012-12-06 (×2): qty 1

## 2012-12-06 MED ORDER — SUCRALFATE 1 G PO TABS
1.0000 g | ORAL_TABLET | Freq: Four times a day (QID) | ORAL | Status: DC
  Administered 2012-12-06 – 2012-12-07 (×5): 1 g via ORAL
  Filled 2012-12-06 (×8): qty 1

## 2012-12-06 MED ORDER — LORATADINE 10 MG PO TABS
10.0000 mg | ORAL_TABLET | Freq: Every day | ORAL | Status: DC
  Administered 2012-12-06 – 2012-12-07 (×2): 10 mg via ORAL
  Filled 2012-12-06 (×2): qty 1

## 2012-12-06 MED ORDER — PANTOPRAZOLE SODIUM 40 MG PO TBEC
40.0000 mg | DELAYED_RELEASE_TABLET | Freq: Every day | ORAL | Status: DC
  Administered 2012-12-06 – 2012-12-07 (×2): 40 mg via ORAL
  Filled 2012-12-06 (×2): qty 1

## 2012-12-06 MED ORDER — ASPIRIN 325 MG PO TABS
325.0000 mg | ORAL_TABLET | ORAL | Status: AC
  Administered 2012-12-06: 325 mg via ORAL

## 2012-12-06 MED ORDER — ZOLPIDEM TARTRATE 5 MG PO TABS
5.0000 mg | ORAL_TABLET | Freq: Every evening | ORAL | Status: DC | PRN
  Administered 2012-12-06: 5 mg via ORAL
  Filled 2012-12-06: qty 1

## 2012-12-06 MED ORDER — ASPIRIN EC 81 MG PO TBEC
81.0000 mg | DELAYED_RELEASE_TABLET | Freq: Every day | ORAL | Status: DC
  Administered 2012-12-07: 81 mg via ORAL
  Filled 2012-12-06: qty 1

## 2012-12-06 MED ORDER — ATENOLOL 25 MG PO TABS
25.0000 mg | ORAL_TABLET | Freq: Every day | ORAL | Status: DC
  Administered 2012-12-06: 25 mg via ORAL
  Filled 2012-12-06 (×2): qty 1

## 2012-12-06 MED ORDER — IRBESARTAN 75 MG PO TABS
75.0000 mg | ORAL_TABLET | Freq: Every day | ORAL | Status: DC
  Administered 2012-12-06 – 2012-12-07 (×2): 75 mg via ORAL
  Filled 2012-12-06 (×2): qty 1

## 2012-12-06 NOTE — ED Notes (Signed)
Admitting MD at bedside.

## 2012-12-06 NOTE — ED Provider Notes (Signed)
7:25 AM Patient discussed with [facility] who has agreed to come see the patient in the ED.  [facility] has agreed to admit the patient.  Pascal Lux Leonard, PA-C 12/06/12 [phone]

## 2012-12-06 NOTE — ED Notes (Signed)
The patient states that she was walking in her house when she broke out into a cold sweat due to a rapid heart rate.

## 2012-12-06 NOTE — ED Provider Notes (Signed)
Medical screening examination/treatment/procedure(s) were performed by non-physician practitioner and as supervising physician I was immediately available for consultation/collaboration.  Lessa Huge R. Raaga Maeder, MD 12/06/12 1549 

## 2012-12-06 NOTE — ED Provider Notes (Signed)
Medical screening examination/treatment/procedure(s) were conducted as a shared visit with non-physician practitioner(s) and myself.  I personally evaluated the patient during the encounter  Episode of near syncope, palpitations, SOB, diaphoresis while walking down hallway. Resolved with rest after 1 hour. Denies chest pain. EKG nsr.  Concern for possible arrhythmia.  Glynn Octave, MD 12/06/12 [phone]

## 2012-12-06 NOTE — ED Provider Notes (Signed)
History     CSN: [id number]  Arrival date & time 12/06/12  4098   First MD Initiated Contact with Patient 12/06/12 0059      Chief Complaint  Patient presents with  . Palpitations   HPI  History provided by the patient. The patient is a 67 year old female with significant cardiac history of hypertension, hyperlipidemia, CAD , PAD left subclavian bypass, plantar valve prolapse who presents with complaints of acute onset her palpitations. Patient was walking down the hallway at her house when she suddenly had past heart rate and pounding heartbeat. Symptoms were so severe with "cold sweats" and lightheadedness. Patient reports mild shortness of breath sensation. She arrived by EMS reports symptoms seem to improve in route. She did however develop uncontrollable tremor to her right upper extremity. Tremor does not change with any movement. She denies similar symptoms before. Denies any headache. No numbness in the extremity. Denies weakness. Denies any chest pain. No other aggravating or alleviating factors. No other associated symptoms.   Past Medical History  Diagnosis Date  . Hypertension 11/30/2010    echo- EF 55%; normal w/ mildly sclerotic aortic valve  . Hyperlipidemia   . Coronary artery disease   . GERD (gastroesophageal reflux disease)   . Mitral valve prolapse   . Osteopenia   . Sigmoid diverticulitis   . Peripheral vascular disease   . Atrial fibrillation   . Nonspecific ST-T wave electrocardiographic changes 03/26/2011    R/Lmv - EF 74%, normal perfusion all regions, ST depression w/ Lexiscan infusion w/o assoc angina  . CAD (coronary artery disease)   . PAD (peripheral artery disease)   . PVD (peripheral vascular disease) 11/05/2011    doppler - R/L brachial pressures essentially equal w/o inflow disease; L sublclavian/CCA bypass graft demonstrates patent flow, no evidence of significant stenosis  . Hypertension 11/05/11    renal dopplers - celiac artery and SMA >50%  diameter reduction, R renal artery - mildly elevated velocities 1-59% diameter reduction, L renal artery normal  . Claudication 01/06/2008    Lower extremity dopplers - no evidence of arterial insufficiency, normal exam    Past Surgical History  Procedure Laterality Date  . Cholecystectomy    . Appendectomy    . Abdominal hysterectomy    . Subclavian artery stents  01/23/2010    Left carotid to subclavian artery Bypass  . Spine surgery  Feb. 2011  . Coronary angioplasty  1995    X's  several  . Cardiac catheterization  09/20/2008    LSA ISR 7x71mm Cordis Genesis on Opta premount, reduction from 80% to 0%    Family History  Problem Relation Age of Onset  . Heart disease Father     Heart Disease before age 40  . Kidney disease Father   . Heart attack Father   . Hyperlipidemia Father   . Hypertension Father   . Diabetes Mother     Varicose Veins  . Diabetes Maternal Grandmother   . Heart disease Paternal Grandfather     History  Substance Use Topics  . Smoking status: Former Smoker    Types: Cigarettes    Quit date: 09/30/1993  . Smokeless tobacco: Never Used  . Alcohol Use: No    OB History   Grav Para Term Preterm Abortions TAB SAB Ect Mult Living                  Review of Systems  Constitutional: Positive for diaphoresis. Negative for fever.  Respiratory: Positive for  shortness of breath.   Cardiovascular: Positive for palpitations. Negative for chest pain.  Neurological: Positive for tremors and light-headedness. Negative for speech difficulty, weakness, numbness and headaches.  All other systems reviewed and are negative.    Allergies  Cortisone; Dilaudid; Iodine; Medrol; Omnipaque; Prednisone; Shellfish allergy; Sulfa drugs cross reactors; Codeine; and Erythromycin  Home Medications   Current Outpatient Rx  Name  Route  Sig  Dispense  Refill  . aspirin EC 81 MG tablet   Oral   Take 81 mg by mouth daily.         Marland Kitchen atenolol (TENORMIN) 25 MG tablet    Oral   Take 25 mg by mouth daily.         Marland Kitchen atorvastatin (LIPITOR) 10 MG tablet   Oral   Take 10 mg by mouth every evening.          . candesartan (ATACAND) 8 MG tablet   Oral   Take 8 mg by mouth every evening.          . Cholecalciferol (VITAMIN D3) 2000 UNITS TABS   Oral   Take 2,000 mg by mouth daily.         . clopidogrel (PLAVIX) 75 MG tablet   Oral   Take 75 mg by mouth daily.         . clorazepate (TRANXENE) 7.5 MG tablet   Oral   Take 1 tablet (7.5 mg total) by mouth daily.   30 tablet   3   . cyanocobalamin 500 MCG tablet   Oral   Take 500 mcg by mouth daily.         Marland Kitchen dexlansoprazole (DEXILANT) 60 MG capsule   Oral   Take 60 mg by mouth daily.         Marland Kitchen estrogens, conjugated, (PREMARIN) 0.625 MG tablet   Oral   Take 0.625 mg by mouth every evening. Does not take inactive pills, active pills daily         . hydrochlorothiazide (HYDRODIURIL) 25 MG tablet   Oral   Take 25 mg by mouth 3 (three) times a week. Monday, Wednesday, Friday         . meclizine (ANTIVERT) 25 MG tablet   Oral   Take 12.5 mg by mouth daily.          . Multiple Vitamin (MULTIVITAMIN WITH MINERALS) TABS   Oral   Take 1 tablet by mouth daily.         . sucralfate (CARAFATE) 1 G tablet   Oral   Take 1 g by mouth 4 (four) times daily.         Marland Kitchen thiamine (VITAMIN B-1) 100 MG tablet   Oral   Take 100 mg by mouth daily.           BP 86/68  Pulse 75  Temp(Src) 98 F (36.7 C) (Oral)  Resp 21  Ht 5\' 4"  (1.626 m)  Wt 153 lb (69.4 kg)  BMI 26.25 kg/m2  SpO2 99%  Physical Exam  Nursing note and vitals reviewed. Constitutional: She is oriented to person, place, and time. She appears well-developed and well-nourished. No distress.  HENT:  Head: Normocephalic.  Neck: Normal range of motion. Neck supple.  Cardiovascular: Normal rate, regular rhythm and intact distal pulses.   Murmur heard. Pulmonary/Chest: Effort normal and breath sounds normal. No  respiratory distress. She has no wheezes. She has no rales.  Abdominal: Soft. There is no tenderness.  Musculoskeletal: Normal range  of motion. She exhibits no edema.  Neurological: She is alert and oriented to person, place, and time.  Tremor of right upper extremity at rest and with activity. Normal strength bilaterally. Normal sensation to light touch. Normal distal pulses and cap refill and upper extremities.  Skin: Skin is warm and dry. No rash noted.  Psychiatric: She has a normal mood and affect. Her behavior is normal.    ED Course  Procedures   Results for orders placed during the hospital encounter of 12/06/12  CBC      Result Value Range   WBC 10.0  4.0 - 10.5 K/uL   RBC 4.04  3.87 - 5.11 MIL/uL   Hemoglobin 11.7 (*) 12.0 - 15.0 g/dL   HCT 62.1 (*) 30.8 - 65.7 %   MCV 83.7  78.0 - 100.0 fL   MCH 29.0  26.0 - 34.0 pg   MCHC 34.6  30.0 - 36.0 g/dL   RDW 84.6  96.2 - 95.2 %   Platelets 300  150 - 400 K/uL  BASIC METABOLIC PANEL      Result Value Range   Sodium 124 (*) 135 - 145 mEq/L   Potassium 3.7  3.5 - 5.1 mEq/L   Chloride 88 (*) 96 - 112 mEq/L   CO2 26  19 - 32 mEq/L   Glucose, Bld 125 (*) 70 - 99 mg/dL   BUN 10  6 - 23 mg/dL   Creatinine, Ser 8.41  0.50 - 1.10 mg/dL   Calcium 8.5  8.4 - 32.4 mg/dL   GFR calc non Af Amer 88 (*) >90 mL/min   GFR calc Af Amer >90  >90 mL/min  PRO B NATRIURETIC PEPTIDE      Result Value Range   Pro B Natriuretic peptide (BNP) 54.9  0 - 125 pg/mL  POCT I-STAT TROPONIN I      Result Value Range   Troponin i, poc 0.01  0.00 - 0.08 ng/mL   Comment 3                Dg Chest 2 View  12/06/2012   *RADIOLOGY REPORT*  Clinical Data: Palpitations, shaky, history hypertension, hyperlipidemia, coronary artery disease, peripheral vascular disease  CHEST - 2 VIEW  Comparison: 08/06/2012  Findings: Enlargement of cardiac silhouette with pulmonary vascular congestion. Tortuous aorta. No acute failure or consolidation. Mild peribronchial  thickening. No pleural effusion or pneumothorax. Left superior mediastinal vascular stent identified, question subclavian versus carotid.  IMPRESSION: Enlargement of cardiac silhouette with pulmonary vascular congestion. Bronchitic changes.   Original Report Authenticated By: Ulyses Southward, M.D.   Ct Head Wo Contrast  12/06/2012   *RADIOLOGY REPORT*  Clinical Data: Palpitations, tachycardia, diaphoresis, history hypertension, coronary artery disease, peripheral vascular disease  CT HEAD WITHOUT CONTRAST  Technique:  Contiguous axial images were obtained from the base of the skull through the vertex without contrast.  Comparison: 07/05/2007  Findings: Normal ventricular morphology. No midline shift or mass effect. Normal appearance of brain parenchyma. No intracranial hemorrhage, mass lesion, or acute infarction. Visualized paranasal sinuses and mastoid air cells clear. Bones unremarkable.  IMPRESSION: No acute intracranial abnormalities.   Original Report Authenticated By: Ulyses Southward, M.D.     1. Palpitations       MDM  1:20 AM patient seen and evaluated. Patient currently appears well in no acute distress. Normal respirations and O2 sats. Pulses regular.  Patient was also seen and evaluated with attending physician. Given patient's cardiac history and her symptoms of diaphoresis  and palpitations feel she should be observed. Plan to call hospitalist for admission.  [facility] will see patient.      Angus Seller, PA-C 12/06/12 [id number]

## 2012-12-06 NOTE — H&P (Signed)
Traci [name] is an 67 y.o. female.   Chief Complaint: Presyncope HPI:   Patient is a 67 year old overweight female who is a patient of Dr. Kandis [name]. Is a history of noncritical coronary disease by cardiac catheterization 2009 1 with peripheral arterial disease as far back as 1993 she had severe subclavian stenosis with steal syndrome. She had PCI and stenting of left subclavian and several other subsequent procedures for restenosis. She also underwent surgery for left carotid subclavian bypass by Dr. Piedad Climes in 2011. She had recent carotid or carotid and upper extremity Dopplers which apparently were stable. She had lower arterial Dopplers in 2009 with a right ABI 1.10 the left 1.13. Her history also includes hyperlipidemia, hypertension.  She also had a nuclear stress test in February of this year which was nonischemic.  Patient reports at approximately 11:30 last night, while walking back from the kitchen to her bedroom, she became hot, diaphoretic, dizzy, SOB and nauseated with elevated heart rate, with a dry mouth. She went back and laid down on the bed and the symptoms appeared again.  She called the EMS and when they arrived her systolic blood pressure was 198.  Patient reports an increase in HCTZ to 5 times a week recently. She drinks no caffeine. She does report dizziness with positional changes in the last week or is also having problem last few weeks with her feet becoming purple and edematous when she stands up. She's also been having intermittent left groin pain which she describes as sharp last few weeks. She reports no pain with ambulation however.   She was scheduled to have lower extremity arterial Dopplers and 2-D echocardiogram in the office this coming Tuesday.   She currently denies vomiting, fever, chest pain, orthopnea, dizziness, cough, congestion, abdominal pain, hematochezia, melena, claudication.   Past Medical History  Diagnosis Date  . Hypertension 11/30/2010    echo-  EF 55%; normal w/ mildly sclerotic aortic valve  . Hyperlipidemia   . Coronary artery disease   . GERD (gastroesophageal reflux disease)   . Mitral valve prolapse   . Osteopenia   . Sigmoid diverticulitis   . Peripheral vascular disease   . Atrial fibrillation   . Nonspecific ST-T wave electrocardiographic changes 03/26/2011    R/Lmv - EF 74%, normal perfusion all regions, ST depression w/ Lexiscan infusion w/o assoc angina  . CAD (coronary artery disease)   . PAD (peripheral artery disease)   . PVD (peripheral vascular disease) 11/05/2011    doppler - R/L brachial pressures essentially equal w/o inflow disease; L sublclavian/CCA bypass graft demonstrates patent flow, no evidence of significant stenosis  . Hypertension 11/05/11    renal dopplers - celiac artery and SMA >50% diameter reduction, R renal artery - mildly elevated velocities 1-59% diameter reduction, L renal artery normal  . Claudication 01/06/2008    Lower extremity dopplers - no evidence of arterial insufficiency, normal exam    Past Surgical History  Procedure Laterality Date  . Cholecystectomy    . Appendectomy    . Abdominal hysterectomy    . Subclavian artery stents  01/23/2010    Left carotid to subclavian artery Bypass  . Spine surgery  Feb. 2011  . Coronary angioplasty  1995    X's  several  . Cardiac catheterization  09/20/2008    LSA ISR 7x59mm Cordis Genesis on Opta premount, reduction from 80% to 0%    Family History  Problem Relation Age of Onset  . Heart disease Father  Heart Disease before age 86  . Kidney disease Father   . Heart attack Father   . Hyperlipidemia Father   . Hypertension Father   . Diabetes Mother     Varicose Veins  . Diabetes Maternal Grandmother   . Heart disease Paternal Grandfather    Social History:  reports that she quit smoking about 19 years ago. Her smoking use included Cigarettes. She smoked 0.00 packs per day. She has never used smokeless tobacco. She reports that she  does not drink alcohol or use illicit drugs.  Allergies:  Allergies  Allergen Reactions  . Cortisone Anaphylaxis  . Dilaudid (Hydromorphone Hcl) Nausea And Vomiting    Pt states she will start vomiting immediately for 6 hours  . Iodine Anaphylaxis  . Medrol (Methylprednisolone) Anaphylaxis  . Omnipaque (Iohexol) Anaphylaxis  . Prednisone Anaphylaxis  . Shellfish Allergy Anaphylaxis  . Sulfa Drugs Cross Reactors Anaphylaxis  . Codeine Rash  . Erythromycin Rash     (Not in a hospital admission)  Results for orders placed during the hospital encounter of 12/06/12 (from the past 48 hour(s))  CBC     Status: Abnormal   Collection Time    12/06/12  1:32 AM      Result Value Range   WBC 10.0  4.0 - 10.5 K/uL   RBC 4.04  3.87 - 5.11 MIL/uL   Hemoglobin 11.7 (*) 12.0 - 15.0 g/dL   HCT 45.4 (*) 09.8 - 11.9 %   MCV 83.7  78.0 - 100.0 fL   MCH 29.0  26.0 - 34.0 pg   MCHC 34.6  30.0 - 36.0 g/dL   RDW 14.7  82.9 - 56.2 %   Platelets 300  150 - 400 K/uL  BASIC METABOLIC PANEL     Status: Abnormal   Collection Time    12/06/12  1:32 AM      Result Value Range   Sodium 124 (*) 135 - 145 mEq/L   Potassium 3.7  3.5 - 5.1 mEq/L   Chloride 88 (*) 96 - 112 mEq/L   CO2 26  19 - 32 mEq/L   Glucose, Bld 125 (*) 70 - 99 mg/dL   BUN 10  6 - 23 mg/dL   Creatinine, Ser 1.30  0.50 - 1.10 mg/dL   Calcium 8.5  8.4 - 86.5 mg/dL   GFR calc non Af Amer 88 (*) >90 mL/min   GFR calc Af Amer >90  >90 mL/min   Comment:            The eGFR has been calculated     using the CKD EPI equation.     This calculation has not been     validated in all clinical     situations.     eGFR's persistently     <90 mL/min signify     possible Chronic Kidney Disease.  PRO B NATRIURETIC PEPTIDE     Status: None   Collection Time    12/06/12  1:35 AM      Result Value Range   Pro B Natriuretic peptide (BNP) 54.9  0 - 125 pg/mL  POCT I-STAT TROPONIN I     Status: None   Collection Time    12/06/12  1:54 AM       Result Value Range   Troponin i, poc 0.01  0.00 - 0.08 ng/mL   Comment 3            Comment: Due to the release kinetics of cTnI,  a negative result within the first hours     of the onset of symptoms does not rule out     myocardial infarction with certainty.     If myocardial infarction is still suspected,     repeat the test at appropriate intervals.  POCT I-STAT TROPONIN I     Status: None   Collection Time    12/06/12  7:44 AM      Result Value Range   Troponin i, poc 0.02  0.00 - 0.08 ng/mL   Comment 3            Comment: Due to the release kinetics of cTnI,     a negative result within the first hours     of the onset of symptoms does not rule out     myocardial infarction with certainty.     If myocardial infarction is still suspected,     repeat the test at appropriate intervals.   Dg Chest 2 View  12/06/2012   *RADIOLOGY REPORT*  Clinical Data: Palpitations, shaky, history hypertension, hyperlipidemia, coronary artery disease, peripheral vascular disease  CHEST - 2 VIEW  Comparison: 08/06/2012  Findings: Enlargement of cardiac silhouette with pulmonary vascular congestion. Tortuous aorta. No acute failure or consolidation. Mild peribronchial thickening. No pleural effusion or pneumothorax. Left superior mediastinal vascular stent identified, question subclavian versus carotid.  IMPRESSION: Enlargement of cardiac silhouette with pulmonary vascular congestion. Bronchitic changes.   Original Report Authenticated By: Ulyses Southward, M.D.   Ct Head Wo Contrast  12/06/2012   *RADIOLOGY REPORT*  Clinical Data: Palpitations, tachycardia, diaphoresis, history hypertension, coronary artery disease, peripheral vascular disease  CT HEAD WITHOUT CONTRAST  Technique:  Contiguous axial images were obtained from the base of the skull through the vertex without contrast.  Comparison: 07/05/2007  Findings: Normal ventricular morphology. No midline shift or mass effect. Normal appearance of brain  parenchyma. No intracranial hemorrhage, mass lesion, or acute infarction. Visualized paranasal sinuses and mastoid air cells clear. Bones unremarkable.  IMPRESSION: No acute intracranial abnormalities.   Original Report Authenticated By: Ulyses Southward, M.D.    Review of Systems  Constitutional: Positive for diaphoresis. Negative for fever.  HENT: Negative for congestion and sore throat.   Respiratory: Positive for shortness of breath. Negative for cough.   Cardiovascular: Positive for palpitations and PND (Questionable). Negative for chest pain and orthopnea.  Gastrointestinal: Positive for nausea. Negative for vomiting, abdominal pain, blood in stool and melena.  Genitourinary: Negative for dysuria and hematuria.  Musculoskeletal: Myalgias: intermittent left groin pain while at rest    Neurological: Positive for dizziness. Negative for weakness.  All other systems reviewed and are negative.    Blood pressure 121/41, pulse 65, temperature 98.3 F (36.8 C), temperature source Oral, resp. rate 21, height 5\' 4"  (1.626 m), weight 153 lb (69.4 kg), SpO2 97.00%. Physical Exam  Constitutional: She is oriented to person, place, and time. She appears well-developed. No distress.  Overweight  HENT:  Head: Normocephalic and atraumatic.  Mouth/Throat: Oropharynx is clear and moist. No oropharyngeal exudate.  Eyes: EOM are normal. Pupils are equal, round, and reactive to light. No scleral icterus.  Neck: Normal range of motion. Neck supple.  Cardiovascular: Normal rate, regular rhythm, S1 normal and S2 normal.   Murmur heard.  Systolic murmur is present with a grade of 1/6  Pulses:      Radial pulses are 2+ on the right side, and 2+ on the left side.       Dorsalis pedis  pulses are 0 on the right side, and 1+ on the left side.       Posterior tibial pulses are 2+ on the right side, and 2+ on the left side.  Bilateral carotid bruits  Respiratory: Effort normal and breath sounds normal. No  respiratory distress. She has no wheezes. She has no rales.  GI: Soft. Bowel sounds are normal. She exhibits no distension and no mass. There is no tenderness.  Musculoskeletal: She exhibits no edema (no lower extremity edema).  Lymphadenopathy:    She has no cervical adenopathy.  Neurological: She is alert and oriented to person, place, and time. She exhibits normal muscle tone.  Skin: Skin is warm and dry.  Psychiatric: She has a normal mood and affect.     Assessment/Plan Principal Problem:   Pre-syncope Active Problems:   Subclavian artery stenosis, left   Hypertension   Peripheral arterial disease   Coronary artery disease, non-occlusive, last cath 2009   Hyponatremia  Plan:  Patient will be admitted to telemetry for observation and monitoring.  She possibly had an arrhythmia which caused her symptoms. She was borderline orthostatic when she arrived at the emergency room, hyponatremic and was given 1 L of normal saline.  Restrict free water intake.  We'll get 2-D echocardiogram as planned.  Lower extremity arterial Dopplers as outpatient.  Recheck BMET this morning and tomoroow. First labs were at 0130hrs. Check Mag and TSH.  HAGER, BRYAN 12/06/2012, 8:26 AM   I have seen and examined the patient along with Wilburt Finlay, PA.  I have reviewed the chart, notes and new data.  I agree with PA's note.  Key new complaints: better lying down Key examination changes: loud bruits over all major upper and lower extremity arterial beds; 2+ left PT, but nonpalpable left DP, 1+ right DP and 2+. Key new findings / data: marked hyponatremia  PLAN: Suspect hypovolemic hyponatremia and I would not restrict fluids. DC diuretic. Imaging studies as above, but if there is delay in obtaining the inpatient echo will allow it to occur as previously scheduled.  Thurmon Fair, MD, [facility] [phone] 12/06/2012, 9:36 AM

## 2012-12-06 NOTE — ED Notes (Signed)
CARDIOLOGY AT THE BEDSIDE

## 2012-12-06 NOTE — ED Notes (Signed)
Fall risk band applied.

## 2012-12-07 ENCOUNTER — Ambulatory Visit (HOSPITAL_COMMUNITY): Payer: TRICARE For Life (TFL)

## 2012-12-07 ENCOUNTER — Other Ambulatory Visit: Payer: Self-pay | Admitting: Cardiology

## 2012-12-07 DIAGNOSIS — I2510 Atherosclerotic heart disease of native coronary artery without angina pectoris: Secondary | ICD-10-CM

## 2012-12-07 DIAGNOSIS — I739 Peripheral vascular disease, unspecified: Secondary | ICD-10-CM

## 2012-12-07 DIAGNOSIS — M79605 Pain in left leg: Secondary | ICD-10-CM

## 2012-12-07 DIAGNOSIS — R55 Syncope and collapse: Secondary | ICD-10-CM | POA: Diagnosis not present

## 2012-12-07 DIAGNOSIS — R600 Localized edema: Secondary | ICD-10-CM

## 2012-12-07 DIAGNOSIS — E871 Hypo-osmolality and hyponatremia: Secondary | ICD-10-CM | POA: Diagnosis not present

## 2012-12-07 DIAGNOSIS — M79604 Pain in right leg: Secondary | ICD-10-CM

## 2012-12-07 DIAGNOSIS — R002 Palpitations: Secondary | ICD-10-CM | POA: Diagnosis not present

## 2012-12-07 LAB — BASIC METABOLIC PANEL
BUN: 8 mg/dL (ref 6–23)
Calcium: 8.2 mg/dL — ABNORMAL LOW (ref 8.4–10.5)
GFR calc Af Amer: >90 mL/min (ref >90)
GFR calc non Af Amer: >90 mL/min (ref >90)
Glucose, Bld: 97 mg/dL (ref 70–99)
Potassium: 3.8 mEq/L (ref 3.5–5.1)

## 2012-12-07 NOTE — Discharge Summary (Signed)
Physician Discharge Summary      Patient ID: Traci [name] MRN: [id number] DOB/AGE: 12/11/1945 67 y.o.  Admit date: 12/06/2012 Discharge date: 12/07/2012  Discharge Diagnoses:  Principal Problem:   Pre-syncope Active Problems:   Hyponatremia, improved holding diuretic   Subclavian artery stenosis, left   Hypertension   Peripheral arterial disease   Coronary artery disease, non-occlusive, last cath 2009   Discharged Condition: good  Procedures: none  Hospital Course:  67 year old overweight female who is a patient of Dr. Kandis Cocking with history of non critical coronary disease by cardiac catheterization 2009 and with peripheral arterial disease as far back as 1993 (she had severe subclavian stenosis with steal syndrome). She had PCI and stenting of left subclavian and several other subsequent procedures for restenosis. She also underwent surgery for left carotid subclavian bypass by Dr. Edilia Bo in 2011. She had recent carotid or carotid and upper extremity Dopplers which apparently were stable. She had lower arterial Dopplers in 2009 with a right ABI 1.10 the left 1.13. Her history also includes hyperlipidemia, hypertension. She also had a nuclear stress test in February of this year which was nonischemic.   Patient came to the ER 12/06/12 for symptoms of near syncope.  Patient reported at approximately 11:30 the night before, while walking back from the kitchen to her bedroom, she became hot, diaphoretic, dizzy, SOB and nauseated with elevated heart rate, with a dry mouth. She went back and laid down on the bed and the symptoms appeared again. She called the EMS and when they arrived her systolic blood pressure was 198. Patient reported an increase in HCTZ to 5 times a week recently. She drinks no caffeine. She does report dizziness with positional changes in the last week and is also having problem last few weeks with her feet becoming purple and edematous when she stands up.  She's also been having intermittent left groin pain which she describes as sharp last few weeks. She reports no pain with ambulation however. She was scheduled to have lower extremity arterial Dopplers and 2-D echocardiogram in the office this coming Tuesday.   She was admitted for observation.  Cardiac enzymes were negative.  Felt this was secondary to hypovolemic hyponatremia.  HCTZ was stopped.  By the next day Na was 134 and pt was not orthostatic.  She ambulated without complications.  2D Echo was done with normal EF.  She will have outpatient lower ext arterial and venous insufficieny dopplers.  Pt was seen and evaluated by Dr. Herbie Baltimore and d/c'd home.      Consults: None  Significant Diagnostic Studies:  BMET    Component Value Date/Time   NA 134* 12/07/2012 0403   K 3.8 12/07/2012 0403   CL 102 12/07/2012 0403   CO2 25 12/07/2012 0403   GLUCOSE 97 12/07/2012 0403   BUN 8 12/07/2012 0403   CREATININE 0.65 12/07/2012 0403   CALCIUM 8.2* 12/07/2012 0403   GFRNONAA >90 12/07/2012 0403   GFRAA >90 12/07/2012 0403   NA on admit was 124.  CBC    Component Value Date/Time   WBC 10.0 12/06/2012 0132   RBC 4.04 12/06/2012 0132   HGB 11.7* 12/06/2012 0132   HCT 33.8* 12/06/2012 0132   PLT 300 12/06/2012 0132   MCV 83.7 12/06/2012 0132   MCH 29.0 12/06/2012 0132   MCHC 34.6 12/06/2012 0132   RDW 12.0 12/06/2012 0132   LYMPHSABS 1.4 08/06/2012 1153   MONOABS 0.3 08/06/2012 1153   EOSABS 0.1 08/06/2012 1153  BASOSABS 0.0 08/06/2012 1153   Negative troponins, TSH 1.090   CT of head:IMPRESSION: No acute intracranial abnormalities  CXR 2 View: Clinical Data: Palpitations, shaky, history hypertension, hyperlipidemia, coronary artery disease, peripheral vascular disease  CHEST - 2 VIEW  Comparison: 08/06/2012  Findings: Enlargement of cardiac silhouette with pulmonary vascular congestion. Tortuous aorta. No acute failure or consolidation. Mild peribronchial thickening. No pleural effusion  or pneumothorax. Left superior mediastinal vascular stent identified, question subclavian versus carotid.  IMPRESSION: Enlargement of cardiac silhouette with pulmonary vascular congestion.  2D Echo: Study Conclusions  Left ventricle: The cavity size was normal. Systolic function was normal. The estimated ejection fraction was in the range of 55% to 60%. Wall motion was normal; there were no regional wall motion abnormalities. Left ventricular diastolic function parameters were normal  Discharge Exam: Blood pressure 127/56, pulse 70, temperature 98.1 F (36.7 C), temperature source Oral, resp. rate 16, height 5\' 4"  (1.626 m), weight 161 lb 12.8 oz (73.392 kg), SpO2 98.00%.  AM exam:   PE:  General appearance: alert, cooperative and no distress  Lungs: Decreased BS on the right. No wheeze or rales.  Heart: regular rate and rhythm, S1, S2 normal, no murmur, click, rub or gallop  Extremities: No LEE  Pulses: Radials 2+ and symmetric. Dorsalis pedis pulses are 0 on the right side, and 1+ on the left side. Posterior tibial pulses are 2+ on the right side, and 2+ on the left side.  Neurologic: Grossly normal     Disposition: 01-Home or Self Care       Future Appointments Provider Department Dept Phone   12/15/2012 9:20 AM Wilburt Finlay, PA-C [facility] [place] [phone]       Medication List    STOP taking these medications       hydrochlorothiazide 25 MG tablet  Commonly known as:  HYDRODIURIL      TAKE these medications       aspirin EC 81 MG tablet  Take 81 mg by mouth daily.     atenolol 25 MG tablet  Commonly known as:  TENORMIN  Take 25 mg by mouth daily.     atorvastatin 10 MG tablet  Commonly known as:  LIPITOR  Take 10 mg by mouth every evening.     candesartan 8 MG tablet  Commonly known as:  ATACAND  Take 8 mg by mouth every evening.     clopidogrel 75 MG tablet  Commonly known as:  PLAVIX  Take 75 mg by mouth daily.       clorazepate 7.5 MG tablet  Commonly known as:  TRANXENE  Take 1 tablet (7.5 mg total) by mouth daily.     cyanocobalamin 500 MCG tablet  Take 500 mcg by mouth daily.     dexlansoprazole 60 MG capsule  Commonly known as:  DEXILANT  Take 60 mg by mouth 2 (two) times daily.     EPIPEN 2-PAK 0.3 mg/0.3 mL Devi  Generic drug:  EPINEPHrine  Inject 0.3 mg into the muscle as needed (for allergic reaction).     estrogens (conjugated) 0.625 MG tablet  Commonly known as:  PREMARIN  Take 0.625 mg by mouth every evening. Does not take inactive pills, active pills daily     fexofenadine 180 MG tablet  Commonly known as:  ALLEGRA  Take 180 mg by mouth daily.     meclizine 25 MG tablet  Commonly known as:  ANTIVERT  Take 12.5 mg by mouth 3 (three) times  daily as needed.     multivitamin with minerals Tabs  Take 1 tablet by mouth daily.     sucralfate 1 G tablet  Commonly known as:  CARAFATE  Take 1 g by mouth 4 (four) times daily.     Vitamin D3 2000 UNITS Tabs  Take 2,000 mg by mouth daily.     zolpidem 10 MG tablet  Commonly known as:  AMBIEN  Take 10 mg by mouth at bedtime as needed for sleep.       Follow-up Information   Follow up with HAGER, BRYAN, PA-C On 12/15/2012. (at 9:20 am)    Contact information:   [address] [place] [address] [phone]       Follow up with Governor Rooks, MD On 01/12/2013. (at 10:00am)    Contact information:   [address] [place] [address] [phone]       Signed: Leone Brand Nurse Practitioner-Certified [facility] 12/07/2012, 2:16 PM  Time spent on discharge : 35 minutes.

## 2012-12-07 NOTE — Progress Notes (Signed)
*  PRELIMINARY RESULTS* Echocardiogram 2D Echocardiogram has been performed.  Traci [name] 12/07/2012, 10:13 AM

## 2012-12-07 NOTE — Progress Notes (Signed)
Subjective: Feeling tired.  Objective: Vital signs in last 24 hours: Temp:  [97.6 F (36.4 C)-98.4 F (36.9 C)] 98.1 F (36.7 C) (06/23 0500) Pulse Rate:  [58-65] 60 (06/23 0500) Resp:  [16-18] 16 (06/23 0500) BP: (110-143)/(41-61) 110/50 mmHg (06/23 0500) SpO2:  [98 %-100 %] 98 % (06/23 0500) Weight:  [156 lb 15.5 oz (71.2 kg)-161 lb 12.8 oz (73.392 kg)] 161 lb 12.8 oz (73.392 kg) (06/23 0500) Last BM Date: 12/06/12  Intake/Output from previous day: 06/22 0701 - 06/23 0700 In: 360 [P.O.:360] Out: 2 [Urine:2] Intake/Output this shift:    Medications Current Facility-Administered Medications  Medication Dose Route Frequency Provider Last Rate Last Dose  . aspirin EC tablet 81 mg  81 mg Oral Daily Wilburt Finlay, PA-C      . atenolol (TENORMIN) tablet 25 mg  25 mg Oral Daily Wilburt Finlay, PA-C   25 mg at 12/06/12 1222  . atorvastatin (LIPITOR) tablet 10 mg  10 mg Oral QPM Wilburt Finlay, PA-C   10 mg at 12/06/12 1704  . clopidogrel (PLAVIX) tablet 75 mg  75 mg Oral Daily Wilburt Finlay, PA-C   75 mg at 12/06/12 1222  . estrogens (conjugated) (PREMARIN) tablet 0.625 mg  0.625 mg Oral QPM Wilburt Finlay, PA-C   0.625 mg at 12/06/12 1704  . irbesartan (AVAPRO) tablet 75 mg  75 mg Oral Daily Wilburt Finlay, PA-C   75 mg at 12/06/12 1222  . loratadine (CLARITIN) tablet 10 mg  10 mg Oral Daily Runell Gess, MD   10 mg at 12/06/12 1953  . pantoprazole (PROTONIX) EC tablet 40 mg  40 mg Oral Daily Wilburt Finlay, PA-C   40 mg at 12/06/12 1222  . sucralfate (CARAFATE) tablet 1 g  1 g Oral QID Wilburt Finlay, PA-C   1 g at 12/06/12 2153  . zolpidem (AMBIEN) tablet 5 mg  5 mg Oral QHS PRN Wilburt Finlay, PA-C   5 mg at 12/06/12 2153    PE: General appearance: alert, cooperative and no distress Lungs: Decreased BS on the right.  No wheeze or rales. Heart: regular rate and rhythm, S1, S2 normal, no murmur, click, rub or gallop Extremities: No LEE Pulses: Radials 2+ and symmetric.  Dorsalis pedis pulses  are 0 on the right side, and 1+ on the left side.  Posterior tibial pulses are 2+ on the right side, and 2+ on the left side.  Neurologic: Grossly normal  Lab Results:   Recent Labs  12/06/12 0132  WBC 10.0  HGB 11.7*  HCT 33.8*  PLT 300   BMET  Recent Labs  12/06/12 0132 12/06/12 1245 12/07/12 0403  NA 124* 132* 134*  K 3.7 3.6 3.8  CL 88* 98 102  CO2 26 23 25   GLUCOSE 125* 115* 97  BUN 10 7 8   CREATININE 0.72 0.62 0.65  CALCIUM 8.5 8.5 8.2*    Assessment/Plan   Principal Problem:   Pre-syncope Active Problems:   Subclavian artery stenosis, left   Hypertension   Peripheral arterial disease   Coronary artery disease, non-occlusive, last cath 2009   Hyponatremia  Plan: SP Na improved to 134.  Recheck orthostatic BPs.  2D echo being completed now.  If not orthostatic, ambulate in hall with assistance.  Possible DC later today depending on echo results..   LOS: 1 day    HAGER, BRYAN 12/07/2012 9:15 AM  I have seen and evaluated the patient this AM along with Wilburt Finlay, PA. I agree with his  findings, examination as well as impression recommendations.  Feels better today - no further dizziness / nausea.  Na+ much improved -- D/c HCTZ. Not orthostatic.  HR in 50s & feeling tired this AM - holdign BB dose today, may consider lower dose or converting to shorter acting med as OP. -- defer to Primary Cardiologist.  She is planning to reschedule LEA Dopplers -- should also get venous dopplers (for venous insufficiency). Echo done today -- barring any unforseen gross abnormalities, anticipate d/c later today.   MD Time with pt:  HARDING,DAVID W, M.D., M.S. [facility] [address] [place], [place]  [address]  [phone] Pager # [phone] 12/07/2012 10:30 AM

## 2012-12-08 ENCOUNTER — Encounter: Payer: Self-pay | Admitting: Cardiovascular Disease

## 2012-12-08 ENCOUNTER — Encounter (HOSPITAL_COMMUNITY): Payer: TRICARE For Life (TFL)

## 2012-12-10 ENCOUNTER — Ambulatory Visit (HOSPITAL_COMMUNITY): Payer: TRICARE For Life (TFL)

## 2012-12-10 NOTE — Discharge Summary (Signed)
Agree with d/c summary.  I simply saw her on the AM of d/c.  Marykay Lex, M.D., M.S. [facility] [address] [place], [place]  [address]  [phone] Pager # [phone] 12/10/2012 9:04 AM

## 2012-12-15 ENCOUNTER — Encounter: Payer: Self-pay | Admitting: Physician Assistant

## 2012-12-15 ENCOUNTER — Ambulatory Visit (INDEPENDENT_AMBULATORY_CARE_PROVIDER_SITE_OTHER): Payer: Medicare Other | Admitting: Physician Assistant

## 2012-12-15 VITALS — BP 132/66 | HR 64 | Ht 64.0 in | Wt 158.5 lb

## 2012-12-15 DIAGNOSIS — I739 Peripheral vascular disease, unspecified: Secondary | ICD-10-CM

## 2012-12-15 DIAGNOSIS — I2510 Atherosclerotic heart disease of native coronary artery without angina pectoris: Secondary | ICD-10-CM | POA: Diagnosis not present

## 2012-12-15 MED ORDER — FUROSEMIDE 20 MG PO TABS: 20.0000 mg | ORAL_TABLET | ORAL | Status: DC | PRN

## 2012-12-15 NOTE — Assessment & Plan Note (Signed)
Patient has been scheduled for lower extremity arterial Doppler.  She does have fairly good pulses in her feet bilaterally. No complaints of claudication. It may be that her lower extremity edema is related to venous insufficiency. She does now have a pair of compression hose which I recommended she wear at least in the afternoon. Also provided her with when necessary Lasix 20 mg daily.

## 2012-12-15 NOTE — Patient Instructions (Signed)
Start lasix 20mg  daily as needed for lower extremity edema.  Continue to where compression stockings.

## 2012-12-15 NOTE — Progress Notes (Signed)
Patient ID: Traci [name], female   DOB: 11-18-1945, 67 y.o.   MRN: [id number]    Date:  12/15/2012   ID:  Traci [name], DOB [birth year], MRN [id number]  PCP:  Dalbert Mayotte, MD  Primary Cardiologist:  Alanda Amass     History of Present Illness: Traci [name] is a 67 y.o. female who is a patient of Dr. Kandis Cocking with history of non critical coronary disease by cardiac catheterization 2009 and with peripheral arterial disease as far back as 1993 (she had severe subclavian stenosis with steal syndrome). She had PCI and stenting of left subclavian and several other subsequent procedures for restenosis. She also underwent surgery for left carotid subclavian bypass by Dr. Edilia Bo in 2011. She had recent carotid and upper extremity Dopplers which apparently were stable. She had lower arterial Dopplers in 2009 with a right ABI 1.10 the left 1.13. Her history also includes hyperlipidemia, hypertension. She also had a nuclear stress test in February of this year which was nonischemic.   Patient came to the ER 12/06/12 for symptoms of near syncope. Patient reported at approximately 11:30 the night before, while walking back from the kitchen to her bedroom, she became hot, diaphoretic, dizzy, SOB and nauseated with elevated heart rate, with a dry mouth. She went back and laid down on the bed and the symptoms appeared again. She called the EMS and when they arrived her systolic blood pressure was 198. Patient reported an increase in HCTZ to 5 times a week recently. She drinks no caffeine. She does report dizziness with positional changes in the last week and is also having problem last few weeks with her feet becoming purple and edematous when she stands up. She's also been having intermittent left groin pain which she describes as sharp last few weeks. She reports no pain with ambulation however. She was scheduled to have lower extremity arterial Dopplers and 2-D echocardiogram in  the office this coming Tuesday.   She was admitted for observation. Cardiac enzymes were negative. Felt this was secondary to hypovolemic hyponatremia. HCTZ was stopped. By the next day Na was 134 and pt was not orthostatic. She ambulated without complications. 2D Echo was done with normal EF.  She maintains mild shortness of breath and intermittent but otherwise denies nausea, vomiting, fever, chest pain, orthopnea, dizziness, PND, cough, congestion, abdominal pain, hematochezia, melena, claudication.  Wt Readings from Last 3 Encounters:  12/15/12 158 lb 8 oz (71.895 kg)  12/07/12 161 lb 12.8 oz (73.392 kg)  10/29/12 158 lb (71.668 kg)     Past Medical History  Diagnosis Date  . Hypertension 11/30/2010    echo- EF 55%; normal w/ mildly sclerotic aortic valve  . Hyperlipidemia   . Coronary artery disease   . GERD (gastroesophageal reflux disease)   . Osteopenia   . Sigmoid diverticulitis   . Peripheral vascular disease   . Atrial fibrillation   . Nonspecific ST-T wave electrocardiographic changes 03/26/2011    R/Lmv - EF 74%, normal perfusion all regions, ST depression w/ Lexiscan infusion w/o assoc angina  . CAD (coronary artery disease)   . PAD (peripheral artery disease)   . PVD (peripheral vascular disease) 11/05/2011    doppler - R/L brachial pressures essentially equal w/o inflow disease; L sublclavian/CCA bypass graft demonstrates patent flow, no evidence of significant stenosis  . Hypertension 11/05/11    renal dopplers - celiac artery and SMA >50% diameter reduction, R renal artery - mildly elevated velocities 1-59% diameter reduction, L renal  artery normal  . Claudication 01/06/2008    Lower extremity dopplers - no evidence of arterial insufficiency, normal exam    Current Outpatient Prescriptions  Medication Sig Dispense Refill  . aspirin EC 81 MG tablet Take 81 mg by mouth daily.      Marland Kitchen atenolol (TENORMIN) 25 MG tablet Take 25 mg by mouth daily.      Marland Kitchen atorvastatin  (LIPITOR) 10 MG tablet Take 10 mg by mouth every evening.       . candesartan (ATACAND) 8 MG tablet Take 8 mg by mouth every evening.       . Cholecalciferol (VITAMIN D3) 2000 UNITS TABS Take 2,000 mg by mouth daily.      . clopidogrel (PLAVIX) 75 MG tablet Take 75 mg by mouth daily.      . clorazepate (TRANXENE) 7.5 MG tablet Take 1 tablet (7.5 mg total) by mouth daily.  30 tablet  3  . cyanocobalamin 500 MCG tablet Take 500 mcg by mouth daily.      Marland Kitchen dexlansoprazole (DEXILANT) 60 MG capsule Take 60 mg by mouth 2 (two) times daily.       Marland Kitchen EPINEPHrine (EPIPEN 2-PAK) 0.3 mg/0.3 mL DEVI Inject 0.3 mg into the muscle as needed (for allergic reaction).      Marland Kitchen estrogens, conjugated, (PREMARIN) 0.625 MG tablet Take 0.625 mg by mouth every evening. Does not take inactive pills, active pills daily      . fexofenadine (ALLEGRA) 180 MG tablet Take 180 mg by mouth daily.      . meclizine (ANTIVERT) 25 MG tablet Take 12.5 mg by mouth 3 (three) times daily as needed.       . Multiple Vitamin (MULTIVITAMIN WITH MINERALS) TABS Take 1 tablet by mouth daily.      . sucralfate (CARAFATE) 1 G tablet Take 1 g by mouth 4 (four) times daily.      Marland Kitchen zolpidem (AMBIEN) 10 MG tablet Take 10 mg by mouth at bedtime as needed for sleep.      . furosemide (LASIX) 20 MG tablet Take 1 tablet (20 mg total) by mouth as needed.  90 tablet  3   No current facility-administered medications for this visit.    Allergies:    Allergies  Allergen Reactions  . Cortisone Anaphylaxis  . Dilaudid (Hydromorphone Hcl) Nausea And Vomiting    Pt states she will start vomiting immediately for 6 hours  . Iodine Anaphylaxis  . Medrol (Methylprednisolone) Anaphylaxis  . Omnipaque (Iohexol) Anaphylaxis  . Prednisone Anaphylaxis  . Shellfish Allergy Anaphylaxis  . Sulfa Drugs Cross Reactors Anaphylaxis  . Codeine Rash  . Erythromycin Rash    Social History:  The patient  reports that she quit smoking about 19 years ago. Her smoking use  included Cigarettes. She smoked 0.00 packs per day. She has never used smokeless tobacco. She reports that she does not drink alcohol or use illicit drugs.   Family history:   Family History  Problem Relation Age of Onset  . Heart disease Father     Heart Disease before age 72  . Kidney disease Father   . Heart attack Father   . Hyperlipidemia Father   . Hypertension Father   . Diabetes Mother     Varicose Veins  . Diabetes Maternal Grandmother   . Heart disease Paternal Grandfather     ROS:  Please see the history of present illness.  All other systems reviewed and negative.   PHYSICAL EXAM: VS:  BP  132/66  Pulse 64  Ht 5\' 4"  (1.626 m)  Wt 158 lb 8 oz (71.895 kg)  BMI 27.19 kg/m2 Well nourished, well developed, in no acute distress HEENT: Pupils are equal round react to light accommodation extraocular movements are intact.  Neck: no JVDNo cervical lymphadenopathy. Cardiac: Regular rate and rhythm with 2/6 systolic murmur right sternal border. No rubs or gallops. Lungs:  clear to auscultation bilaterally, no wheezing, rhonchi or rales Ext: no lower extremity edema.  2+ radial and dorsalis pedis pulses. Skin: warm and dry Neuro:  Grossly normal   ASSESSMENT AND PLAN:  Problem List Items Addressed This Visit   Peripheral arterial disease     Patient has been scheduled for lower extremity arterial Doppler.  She does have fairly good pulses in her feet bilaterally. No complaints of claudication. It may be that her lower extremity edema is related to venous insufficiency. She does now have a pair of compression hose which I recommended she wear at least in the afternoon. Also provided her with when necessary Lasix 20 mg daily.    Relevant Medications      furosemide (LASIX) tablet   Coronary artery disease, non-occlusive, last cath 2009 - Primary   Relevant Medications      furosemide (LASIX) tablet

## 2012-12-21 ENCOUNTER — Telehealth: Payer: Self-pay | Admitting: Cardiovascular Disease

## 2012-12-21 MED ORDER — ATENOLOL 25 MG PO TABS: 25.0000 mg | ORAL_TABLET | Freq: Every day | ORAL | Status: DC

## 2012-12-21 NOTE — Telephone Encounter (Signed)
Traci [name] is calling because she needs a new prescription for Atenolol 25mg  twice a day so that insurance will pay for the medication .Marland Kitchen Also she said it has to have her last name lie this on it [facility] otherwise insurance  will not pay

## 2012-12-21 NOTE — Telephone Encounter (Signed)
Atenolol 25 mg was send in to [facility]

## 2012-12-28 ENCOUNTER — Ambulatory Visit (HOSPITAL_COMMUNITY)
Admission: RE | Admit: 2012-12-28 | Discharge: 2012-12-28 | Disposition: A | Discharge status: Home / Self Care | Payer: Medicare Other | Source: Ambulatory Visit | Attending: Cardiovascular Disease | Admitting: Cardiovascular Disease

## 2012-12-28 DIAGNOSIS — R6889 Other general symptoms and signs: Secondary | ICD-10-CM | POA: Diagnosis not present

## 2012-12-28 DIAGNOSIS — M79609 Pain in unspecified limb: Secondary | ICD-10-CM | POA: Diagnosis not present

## 2012-12-28 DIAGNOSIS — M7989 Other specified soft tissue disorders: Secondary | ICD-10-CM | POA: Insufficient documentation

## 2012-12-28 DIAGNOSIS — R600 Localized edema: Secondary | ICD-10-CM

## 2012-12-28 DIAGNOSIS — R5383 Other fatigue: Secondary | ICD-10-CM | POA: Diagnosis not present

## 2012-12-28 DIAGNOSIS — R5381 Other malaise: Secondary | ICD-10-CM | POA: Diagnosis not present

## 2012-12-28 DIAGNOSIS — M79604 Pain in right leg: Secondary | ICD-10-CM

## 2012-12-28 DIAGNOSIS — R609 Edema, unspecified: Secondary | ICD-10-CM | POA: Diagnosis not present

## 2012-12-28 DIAGNOSIS — I739 Peripheral vascular disease, unspecified: Secondary | ICD-10-CM

## 2012-12-28 NOTE — Progress Notes (Signed)
Lower Extremity Arterial Duplex Completed. °Traci [name] ° °

## 2012-12-29 DIAGNOSIS — M545 Low back pain, unspecified: Secondary | ICD-10-CM | POA: Diagnosis not present

## 2012-12-29 DIAGNOSIS — M810 Age-related osteoporosis without current pathological fracture: Secondary | ICD-10-CM | POA: Diagnosis not present

## 2012-12-29 DIAGNOSIS — IMO0002 Reserved for concepts with insufficient information to code with codable children: Secondary | ICD-10-CM | POA: Diagnosis not present

## 2012-12-31 ENCOUNTER — Telehealth: Payer: Self-pay | Admitting: Cardiovascular Disease

## 2013-01-12 DIAGNOSIS — I2510 Atherosclerotic heart disease of native coronary artery without angina pectoris: Secondary | ICD-10-CM | POA: Diagnosis not present

## 2013-01-12 DIAGNOSIS — R609 Edema, unspecified: Secondary | ICD-10-CM | POA: Diagnosis not present

## 2013-01-14 ENCOUNTER — Inpatient Hospital Stay (HOSPITAL_COMMUNITY): Admission: RE | Admit: 2013-01-14 | Payer: Medicare Other | Source: Ambulatory Visit

## 2013-01-20 ENCOUNTER — Other Ambulatory Visit: Payer: Self-pay

## 2013-01-20 ENCOUNTER — Ambulatory Visit (HOSPITAL_COMMUNITY)
Admission: RE | Admit: 2013-01-20 | Discharge: 2013-01-20 | Disposition: A | Discharge status: Home / Self Care | Payer: Medicare Other | Source: Ambulatory Visit | Attending: Cardiovascular Disease | Admitting: Cardiovascular Disease

## 2013-01-20 DIAGNOSIS — I739 Peripheral vascular disease, unspecified: Secondary | ICD-10-CM

## 2013-01-20 DIAGNOSIS — R609 Edema, unspecified: Secondary | ICD-10-CM | POA: Insufficient documentation

## 2013-01-20 DIAGNOSIS — M79609 Pain in unspecified limb: Secondary | ICD-10-CM | POA: Insufficient documentation

## 2013-01-20 DIAGNOSIS — M79604 Pain in right leg: Secondary | ICD-10-CM

## 2013-01-20 DIAGNOSIS — R600 Localized edema: Secondary | ICD-10-CM

## 2013-01-20 NOTE — Progress Notes (Signed)
Venous Duplex Completed. °Traci [name] ° °

## 2013-02-02 DIAGNOSIS — IMO0002 Reserved for concepts with insufficient information to code with codable children: Secondary | ICD-10-CM | POA: Diagnosis not present

## 2013-02-02 DIAGNOSIS — M47817 Spondylosis without myelopathy or radiculopathy, lumbosacral region: Secondary | ICD-10-CM | POA: Diagnosis not present

## 2013-02-02 DIAGNOSIS — M545 Low back pain, unspecified: Secondary | ICD-10-CM | POA: Diagnosis not present

## 2013-02-04 DIAGNOSIS — M545 Low back pain, unspecified: Secondary | ICD-10-CM | POA: Diagnosis not present

## 2013-02-09 DIAGNOSIS — M545 Low back pain, unspecified: Secondary | ICD-10-CM | POA: Diagnosis not present

## 2013-02-11 DIAGNOSIS — M545 Low back pain, unspecified: Secondary | ICD-10-CM | POA: Diagnosis not present

## 2013-02-18 DIAGNOSIS — M545 Low back pain, unspecified: Secondary | ICD-10-CM | POA: Diagnosis not present

## 2013-02-23 DIAGNOSIS — M545 Low back pain, unspecified: Secondary | ICD-10-CM | POA: Diagnosis not present

## 2013-02-24 DIAGNOSIS — M545 Low back pain, unspecified: Secondary | ICD-10-CM | POA: Diagnosis not present

## 2013-03-01 DIAGNOSIS — M545 Low back pain, unspecified: Secondary | ICD-10-CM | POA: Diagnosis not present

## 2013-03-02 DIAGNOSIS — M545 Low back pain, unspecified: Secondary | ICD-10-CM | POA: Diagnosis not present

## 2013-03-09 DIAGNOSIS — M545 Low back pain, unspecified: Secondary | ICD-10-CM | POA: Diagnosis not present

## 2013-03-11 DIAGNOSIS — M545 Low back pain, unspecified: Secondary | ICD-10-CM | POA: Diagnosis not present

## 2013-03-16 DIAGNOSIS — J069 Acute upper respiratory infection, unspecified: Secondary | ICD-10-CM | POA: Diagnosis not present

## 2013-03-23 DIAGNOSIS — M545 Low back pain, unspecified: Secondary | ICD-10-CM | POA: Diagnosis not present

## 2013-03-25 DIAGNOSIS — M545 Low back pain, unspecified: Secondary | ICD-10-CM | POA: Diagnosis not present

## 2013-03-30 ENCOUNTER — Telehealth: Payer: Self-pay | Admitting: Cardiovascular Disease

## 2013-03-30 NOTE — Telephone Encounter (Signed)
To be refilled by pcp

## 2013-03-30 NOTE — Telephone Encounter (Signed)
Per JC, LPN, refill request received from pharmacy and instructions for pt to have refilled by PCP as Dr. Alanda Amass has retired and unable to refill Rx.

## 2013-03-30 NOTE — Telephone Encounter (Signed)
Needs new RX from [facility] says was sent electronically but not received back.  RX

## 2013-03-30 NOTE — Telephone Encounter (Signed)
Needs new rx for clorazepate 7.5 mg from [facility]  [facility] has sent [facility] .  Not been filled.

## 2013-03-31 DIAGNOSIS — M545 Low back pain, unspecified: Secondary | ICD-10-CM | POA: Diagnosis not present

## 2013-03-31 DIAGNOSIS — M47817 Spondylosis without myelopathy or radiculopathy, lumbosacral region: Secondary | ICD-10-CM | POA: Diagnosis not present

## 2013-03-31 DIAGNOSIS — IMO0002 Reserved for concepts with insufficient information to code with codable children: Secondary | ICD-10-CM | POA: Diagnosis not present

## 2013-04-05 ENCOUNTER — Encounter: Payer: Self-pay | Admitting: Cardiovascular Disease

## 2013-04-05 ENCOUNTER — Ambulatory Visit (INDEPENDENT_AMBULATORY_CARE_PROVIDER_SITE_OTHER): Payer: Medicare Other | Admitting: Cardiovascular Disease

## 2013-04-05 VITALS — BP 110/70 | HR 68 | Resp 16 | Ht 63.0 in | Wt 161.4 lb

## 2013-04-05 DIAGNOSIS — Z79899 Other long term (current) drug therapy: Secondary | ICD-10-CM | POA: Diagnosis not present

## 2013-04-05 DIAGNOSIS — I2510 Atherosclerotic heart disease of native coronary artery without angina pectoris: Secondary | ICD-10-CM

## 2013-04-05 DIAGNOSIS — I10 Essential (primary) hypertension: Secondary | ICD-10-CM

## 2013-04-05 DIAGNOSIS — I739 Peripheral vascular disease, unspecified: Secondary | ICD-10-CM

## 2013-04-05 DIAGNOSIS — I771 Stricture of artery: Secondary | ICD-10-CM

## 2013-04-05 DIAGNOSIS — E785 Hyperlipidemia, unspecified: Secondary | ICD-10-CM | POA: Diagnosis not present

## 2013-04-05 DIAGNOSIS — I708 Atherosclerosis of other arteries: Secondary | ICD-10-CM

## 2013-04-05 NOTE — Assessment & Plan Note (Signed)
Blood pressure at goal

## 2013-04-05 NOTE — Patient Instructions (Signed)
Your physician recommends that you schedule a follow-up appointment in: 6 months Your physician recommends that you return for lab work when fasting

## 2013-04-05 NOTE — Progress Notes (Signed)
Patient ID: Traci [name], female   DOB: 07/23/1945, 67 y.o.   MRN: [id number]      Reason for office visit PAD, hypertension, nonocclusive CAD  Traci [name] has long-standing history of peripheral arterial disease with previous stents and surgical revascularization of the left subclavian artery for subclavian steal syndrome and presyncope. She eventually underwent a left carotid to subclavian bypass. A few months ago she was seen in the emergency room with symptomatic hyponatremia related to diuretic therapy. She feels much better after discontinuing the diuretic. She has had no similar events since. Her blood pressure has been well controlled. Denies any cough chest pain or shortness of breath. A lower extremity venous duplex study performed in August did not show significant insufficiency. A lower extremity arterial duplex study performed in July showed bilateral ABIs of 1.1 without any significant obstructive lesions. A carotid/left subclavian artery duplex ultrasound performed at [facility] last October showed a widely patent bypass. Echocardiography performed in June showed normal left ventricular systolic and diastolic function without any meaningful for abnormalities. A nuclear myocardial perfusion study in March was normal.  Allergies  Allergen Reactions  . Cortisone Anaphylaxis  . Dilaudid [Hydromorphone Hcl] Nausea And Vomiting    Pt states she will start vomiting immediately for 6 hours  . Iodine Anaphylaxis  . Medrol [Methylprednisolone] Anaphylaxis  . Omnipaque [Iohexol] Anaphylaxis  . Prednisone Anaphylaxis  . Shellfish Allergy Anaphylaxis  . Sulfa Drugs Cross Reactors Anaphylaxis  . Codeine Rash  . Erythromycin Rash    Current Outpatient Prescriptions  Medication Sig Dispense Refill  . aspirin EC 81 MG tablet Take 81 mg by mouth daily.      Marland Kitchen atenolol (TENORMIN) 25 MG tablet Take 1 tablet (25 mg total) by mouth daily.  30 tablet  11  . atorvastatin  (LIPITOR) 10 MG tablet Take 10 mg by mouth every evening.       . candesartan (ATACAND) 8 MG tablet Take 8 mg by mouth every evening.       . Cholecalciferol (VITAMIN D3) 2000 UNITS TABS Take 2,000 mg by mouth daily.      . clopidogrel (PLAVIX) 75 MG tablet Take 75 mg by mouth daily.      . clorazepate (TRANXENE) 7.5 MG tablet Take 1 tablet (7.5 mg total) by mouth daily.  30 tablet  3  . cyanocobalamin 500 MCG tablet Take 500 mcg by mouth daily.      Marland Kitchen dexlansoprazole (DEXILANT) 60 MG capsule Take 60 mg by mouth daily.       Marland Kitchen EPINEPHrine (EPIPEN 2-PAK) 0.3 mg/0.3 mL DEVI Inject 0.3 mg into the muscle as needed (for allergic reaction).      Marland Kitchen estradiol (ESTRACE) 0.5 MG tablet Take 0.5 mg by mouth daily.      . fexofenadine (ALLEGRA) 180 MG tablet Take 180 mg by mouth daily.      . furosemide (LASIX) 20 MG tablet Take 20 mg by mouth every other day. Take one tablet Mon/Wed/Fri.      . meclizine (ANTIVERT) 25 MG tablet Take 12.5 mg by mouth 3 (three) times daily as needed.       . Multiple Vitamin (MULTIVITAMIN WITH MINERALS) TABS Take 1 tablet by mouth daily.      . sucralfate (CARAFATE) 1 G tablet Take 1 g by mouth 4 (four) times daily.      Marland Kitchen zolpidem (AMBIEN) 10 MG tablet Take 10 mg by mouth at bedtime as needed for sleep.  No current facility-administered medications for this visit.    Past Medical History  Diagnosis Date  . Hypertension 11/30/2010    echo- EF 55%; normal w/ mildly sclerotic aortic valve  . Hyperlipidemia   . Coronary artery disease   . GERD (gastroesophageal reflux disease)   . Osteopenia   . Sigmoid diverticulitis   . Peripheral vascular disease   . Atrial fibrillation   . Nonspecific ST-T wave electrocardiographic changes 03/26/2011    R/Lmv - EF 74%, normal perfusion all regions, ST depression w/ Lexiscan infusion w/o assoc angina  . CAD (coronary artery disease)   . PAD (peripheral artery disease)   . PVD (peripheral vascular disease) 11/05/2011     doppler - R/L brachial pressures essentially equal w/o inflow disease; L sublclavian/CCA bypass graft demonstrates patent flow, no evidence of significant stenosis  . Hypertension 11/05/11    renal dopplers - celiac artery and SMA >50% diameter reduction, R renal artery - mildly elevated velocities 1-59% diameter reduction, L renal artery normal  . Claudication 01/06/2008    Lower extremity dopplers - no evidence of arterial insufficiency, normal exam    Past Surgical History  Procedure Laterality Date  . Cholecystectomy    . Appendectomy    . Abdominal hysterectomy    . Subclavian artery stents  01/23/2010    Left carotid to subclavian artery Bypass  . Spine surgery  Feb. 2011  . Coronary angioplasty  1995    X's  several  . Cardiac catheterization  09/20/2008    LSA ISR 7x60mm Cordis Genesis on Opta premount, reduction from 80% to 0%    Family History  Problem Relation Age of Onset  . Heart disease Father     Heart Disease before age 21  . Kidney disease Father   . Heart attack Father   . Hyperlipidemia Father   . Hypertension Father   . Diabetes Mother     Varicose Veins  . Diabetes Maternal Grandmother   . Heart disease Paternal Grandfather     History   Social History  . Marital Status: Legally Separated    Spouse Name: N/A    Number of Children: N/A  . Years of Education: N/A   Occupational History  . Not on file.   Social History Main Topics  . Smoking status: Former Smoker    Types: Cigarettes    Quit date: 09/30/1993  . Smokeless tobacco: Never Used  . Alcohol Use: No  . Drug Use: No  . Sexual Activity: Not on file   Other Topics Concern  . Not on file   Social History Narrative  . No narrative on file    Review of systems: The patient specifically denies any chest pain at rest or with exertion, dyspnea at rest or with exertion, orthopnea, paroxysmal nocturnal dyspnea, syncope, palpitations, focal neurological deficits, intermittent claudication,  lower extremity edema, unexplained weight gain, cough, hemoptysis or wheezing.  The patient also denies abdominal pain, nausea, vomiting, dysphagia, diarrhea, constipation, polyuria, polydipsia, dysuria, hematuria, frequency, urgency, abnormal bleeding or bruising, fever, chills, unexpected weight changes, mood swings, change in skin or hair texture, change in voice quality, auditory or visual problems, allergic reactions or rashes, new musculoskeletal complaints other than usual "aches and pains".   PHYSICAL EXAM BP 110/70  Pulse 68  Ht 5\' 3"  (1.6 m)  Wt 161 lb 6.4 oz (73.211 kg)  BMI 28.6 kg/m2  General: Alert, oriented x3, no distress Head: no evidence of trauma, PERRL, EOMI, no exophtalmos or  lid lag, no myxedema, no xanthelasma; normal ears, nose and oropharynx Neck: normal jugular venous pulsations and no hepatojugular reflux; brisk carotid pulses without delay and faint bilateral carotid bruits; left supraclavicular scar of previous bypass procedure Chest: clear to auscultation, no signs of consolidation by percussion or palpation, normal fremitus, symmetrical and full respiratory excursions Cardiovascular: normal position and quality of the apical impulse, regular rhythm, normal first and second heart sounds, no murmurs, rubs or gallops Abdomen: no tenderness or distention, no masses by palpation, no abnormal pulsatility or arterial bruits, normal bowel sounds, no hepatosplenomegaly Extremities: no clubbing, cyanosis or edema; 2+ radial, ulnar and brachial pulses bilaterally; 2+ right femoral, posterior tibial and dorsalis pedis pulses; 2+ left femoral, posterior tibial and dorsalis pedis pulses; no subclavian or femoral bruits Neurological: grossly nonfocal   EKG: Sinus rhythm, normal tracing  Lipid Panel     Component Value Date/Time   CHOL  Value: 132        ATP III CLASSIFICATION:  <200     mg/dL   Desirable  829-562  mg/dL   Borderline High  >=130    mg/dL   High 86/10/7844  9629   TRIG 78 05/25/2008 1440   HDL 47 05/25/2008 1440   CHOLHDL 2.8 05/25/2008 1440   VLDL 16 05/25/2008 1440   LDLCALC  Value: 69        Total Cholesterol/HDL:CHD Risk Coronary Heart Disease Risk Table                     Men   Women  1/2 Average Risk   3.4   3.3 05/25/2008 1440    BMET    Component Value Date/Time   NA 134* 12/07/2012 0403   K 3.8 12/07/2012 0403   CL 102 12/07/2012 0403   CO2 25 12/07/2012 0403   GLUCOSE 97 12/07/2012 0403   BUN 8 12/07/2012 0403   CREATININE 0.65 12/07/2012 0403   CALCIUM 8.2* 12/07/2012 0403   GFRNONAA >90 12/07/2012 0403   GFRAA >90 12/07/2012 0403     ASSESSMENT AND PLAN Peripheral arterial disease Despite extensive problems with atherosclerosis especially in the upper extremities she is completely asymptomatic.  Hyperlipidemia Will order updated labs today, and she will return to have them drawn another day since she is not currently fasting  HTN (hypertension) Blood pressure at goal   Orders Placed This Encounter  Procedures  . Comp Met (CMET)  . Lipid Profile   Meds ordered this encounter  Medications  . estradiol (ESTRACE) 0.5 MG tablet    Sig: Take 0.5 mg by mouth daily.  . furosemide (LASIX) 20 MG tablet    Sig: Take 20 mg by mouth every other day. Take one tablet Mon/Wed/Fri.    Junious Silk, MD, [facility] [phone] office [phone] pager

## 2013-04-05 NOTE — Assessment & Plan Note (Signed)
Despite extensive problems with atherosclerosis especially in the upper extremities she is completely asymptomatic.

## 2013-04-05 NOTE — Assessment & Plan Note (Addendum)
Will order updated labs today, and she will return to have them drawn another day since she is not currently fasting

## 2013-04-07 DIAGNOSIS — E785 Hyperlipidemia, unspecified: Secondary | ICD-10-CM | POA: Diagnosis not present

## 2013-04-07 DIAGNOSIS — M25469 Effusion, unspecified knee: Secondary | ICD-10-CM | POA: Diagnosis not present

## 2013-04-07 DIAGNOSIS — R609 Edema, unspecified: Secondary | ICD-10-CM | POA: Diagnosis not present

## 2013-04-07 DIAGNOSIS — M79609 Pain in unspecified limb: Secondary | ICD-10-CM | POA: Diagnosis not present

## 2013-04-07 DIAGNOSIS — M25569 Pain in unspecified knee: Secondary | ICD-10-CM | POA: Diagnosis not present

## 2013-04-07 DIAGNOSIS — M7989 Other specified soft tissue disorders: Secondary | ICD-10-CM | POA: Diagnosis not present

## 2013-04-07 DIAGNOSIS — Z79899 Other long term (current) drug therapy: Secondary | ICD-10-CM | POA: Diagnosis not present

## 2013-04-08 LAB — COMPREHENSIVE METABOLIC PANEL
ALT: 21 IU/L (ref 0–32)
AST: 29 IU/L (ref 0–40)
Alkaline Phosphatase: 73 IU/L (ref 39–117)
BUN/Creatinine Ratio: 12 (ref 11–26)
CO2: 28 mmol/L (ref 18–29)
Calcium: 9 mg/dL (ref 8.6–10.2)
Chloride: 101 mmol/L (ref 97–108)
Creatinine, Ser: 0.86 mg/dL (ref 0.57–1.00)
Globulin, Total: 2.5 g/dL (ref 1.5–4.5)
Potassium: 4.5 mmol/L (ref 3.5–5.2)
Sodium: 144 mmol/L (ref 134–144)

## 2013-04-08 LAB — LIPID PANEL
Chol/HDL Ratio: 3 ratio units (ref 0.0–4.4)
Cholesterol, Total: 141 mg/dL (ref 100–199)
HDL: 47 mg/dL (ref >39)
Triglycerides: 136 mg/dL (ref 0–149)

## 2013-04-22 ENCOUNTER — Other Ambulatory Visit: Payer: Self-pay

## 2013-04-27 ENCOUNTER — Telehealth: Payer: Self-pay | Admitting: Cardiovascular Disease

## 2013-04-27 NOTE — Telephone Encounter (Signed)
Message forwarded to J.C. Wildman, LPN.  

## 2013-04-27 NOTE — Telephone Encounter (Signed)
Please have J C to call her-she needs to ask him a question.

## 2013-04-28 DIAGNOSIS — M25569 Pain in unspecified knee: Secondary | ICD-10-CM | POA: Diagnosis not present

## 2013-04-28 DIAGNOSIS — R609 Edema, unspecified: Secondary | ICD-10-CM | POA: Diagnosis not present

## 2013-05-06 ENCOUNTER — Other Ambulatory Visit: Payer: Self-pay

## 2013-05-06 MED ORDER — CANDESARTAN CILEXETIL 8 MG PO TABS: 8.0000 mg | ORAL_TABLET | Freq: Every evening | ORAL | Status: DC

## 2013-05-06 NOTE — Telephone Encounter (Signed)
Rx was sent to pharmacy electronically. 

## 2013-05-17 ENCOUNTER — Other Ambulatory Visit: Payer: Self-pay | Admitting: *Deleted

## 2013-05-17 DIAGNOSIS — J069 Acute upper respiratory infection, unspecified: Secondary | ICD-10-CM | POA: Diagnosis not present

## 2013-05-17 DIAGNOSIS — J029 Acute pharyngitis, unspecified: Secondary | ICD-10-CM | POA: Diagnosis not present

## 2013-05-17 MED ORDER — ATORVASTATIN CALCIUM 10 MG PO TABS: 10.0000 mg | ORAL_TABLET | Freq: Every evening | ORAL | Status: DC

## 2013-05-17 MED ORDER — CLOPIDOGREL BISULFATE 75 MG PO TABS: 75.0000 mg | ORAL_TABLET | Freq: Every day | ORAL | Status: DC

## 2013-06-15 DIAGNOSIS — J329 Chronic sinusitis, unspecified: Secondary | ICD-10-CM | POA: Diagnosis not present

## 2013-06-24 DIAGNOSIS — R29898 Other symptoms and signs involving the musculoskeletal system: Secondary | ICD-10-CM | POA: Diagnosis not present

## 2013-06-24 DIAGNOSIS — R5381 Other malaise: Secondary | ICD-10-CM | POA: Diagnosis not present

## 2013-06-24 DIAGNOSIS — M79609 Pain in unspecified limb: Secondary | ICD-10-CM | POA: Diagnosis not present

## 2013-06-24 DIAGNOSIS — M25569 Pain in unspecified knee: Secondary | ICD-10-CM | POA: Diagnosis not present

## 2013-06-24 DIAGNOSIS — G4700 Insomnia, unspecified: Secondary | ICD-10-CM | POA: Diagnosis not present

## 2013-07-05 DIAGNOSIS — Z981 Arthrodesis status: Secondary | ICD-10-CM | POA: Diagnosis not present

## 2013-07-05 DIAGNOSIS — R29898 Other symptoms and signs involving the musculoskeletal system: Secondary | ICD-10-CM | POA: Diagnosis not present

## 2013-07-05 DIAGNOSIS — M545 Low back pain, unspecified: Secondary | ICD-10-CM | POA: Diagnosis not present

## 2013-07-05 DIAGNOSIS — M25559 Pain in unspecified hip: Secondary | ICD-10-CM | POA: Diagnosis not present

## 2013-08-25 DIAGNOSIS — K589 Irritable bowel syndrome without diarrhea: Secondary | ICD-10-CM | POA: Diagnosis not present

## 2013-08-25 DIAGNOSIS — R143 Flatulence: Secondary | ICD-10-CM | POA: Diagnosis not present

## 2013-08-25 DIAGNOSIS — R141 Gas pain: Secondary | ICD-10-CM | POA: Diagnosis not present

## 2013-09-06 DIAGNOSIS — K589 Irritable bowel syndrome without diarrhea: Secondary | ICD-10-CM | POA: Diagnosis not present

## 2013-09-06 DIAGNOSIS — R141 Gas pain: Secondary | ICD-10-CM | POA: Diagnosis not present

## 2013-09-06 DIAGNOSIS — R142 Eructation: Secondary | ICD-10-CM | POA: Diagnosis not present

## 2013-09-21 DIAGNOSIS — R300 Dysuria: Secondary | ICD-10-CM | POA: Diagnosis not present

## 2013-09-21 DIAGNOSIS — N390 Urinary tract infection, site not specified: Secondary | ICD-10-CM | POA: Diagnosis not present

## 2013-09-21 DIAGNOSIS — R141 Gas pain: Secondary | ICD-10-CM | POA: Diagnosis not present

## 2013-09-21 DIAGNOSIS — R109 Unspecified abdominal pain: Secondary | ICD-10-CM | POA: Diagnosis not present

## 2013-09-21 DIAGNOSIS — K589 Irritable bowel syndrome without diarrhea: Secondary | ICD-10-CM | POA: Insufficient documentation

## 2013-09-27 DIAGNOSIS — K5900 Constipation, unspecified: Secondary | ICD-10-CM | POA: Diagnosis not present

## 2013-09-27 DIAGNOSIS — K219 Gastro-esophageal reflux disease without esophagitis: Secondary | ICD-10-CM | POA: Diagnosis not present

## 2013-09-27 DIAGNOSIS — R109 Unspecified abdominal pain: Secondary | ICD-10-CM | POA: Diagnosis not present

## 2013-10-19 ENCOUNTER — Telehealth: Payer: Self-pay | Admitting: Cardiovascular Disease

## 2013-10-19 NOTE — Telephone Encounter (Signed)
Is needing a lab order mailed to her before her appt on 12/30/13 .Marland Kitchen Thanks

## 2013-10-19 NOTE — Telephone Encounter (Signed)
Left message on answer machine- Patient does not need lab at present time.She had labs drawn in 0ct. 2014- labs in normal limits. Any question may call back.

## 2013-10-21 DIAGNOSIS — M949 Disorder of cartilage, unspecified: Secondary | ICD-10-CM | POA: Diagnosis not present

## 2013-10-21 DIAGNOSIS — Z1231 Encounter for screening mammogram for malignant neoplasm of breast: Secondary | ICD-10-CM | POA: Diagnosis not present

## 2013-10-21 DIAGNOSIS — M899 Disorder of bone, unspecified: Secondary | ICD-10-CM | POA: Diagnosis not present

## 2013-10-21 LAB — HM DEXA SCAN

## 2013-10-27 DIAGNOSIS — Z0000 Encounter for general adult medical examination without abnormal findings: Secondary | ICD-10-CM | POA: Diagnosis not present

## 2013-10-27 DIAGNOSIS — R110 Nausea: Secondary | ICD-10-CM | POA: Diagnosis not present

## 2013-11-06 ENCOUNTER — Other Ambulatory Visit: Payer: Self-pay | Admitting: Cardiovascular Disease

## 2013-11-06 NOTE — Telephone Encounter (Signed)
Rx was sent to pharmacy electronically. 

## 2013-11-15 DIAGNOSIS — K589 Irritable bowel syndrome without diarrhea: Secondary | ICD-10-CM | POA: Diagnosis not present

## 2013-11-17 DIAGNOSIS — H2510 Age-related nuclear cataract, unspecified eye: Secondary | ICD-10-CM | POA: Diagnosis not present

## 2013-11-26 ENCOUNTER — Ambulatory Visit (INDEPENDENT_AMBULATORY_CARE_PROVIDER_SITE_OTHER): Payer: Medicare Other | Admitting: Cardiovascular Disease

## 2013-11-26 ENCOUNTER — Encounter: Payer: Self-pay | Admitting: Cardiovascular Disease

## 2013-11-26 VITALS — BP 142/64 | HR 60 | Resp 16 | Ht 64.0 in | Wt 157.3 lb

## 2013-11-26 DIAGNOSIS — I771 Stricture of artery: Secondary | ICD-10-CM

## 2013-11-26 DIAGNOSIS — I708 Atherosclerosis of other arteries: Secondary | ICD-10-CM | POA: Diagnosis not present

## 2013-11-26 DIAGNOSIS — I10 Essential (primary) hypertension: Secondary | ICD-10-CM

## 2013-11-26 DIAGNOSIS — I6529 Occlusion and stenosis of unspecified carotid artery: Secondary | ICD-10-CM

## 2013-11-26 DIAGNOSIS — I2510 Atherosclerotic heart disease of native coronary artery without angina pectoris: Secondary | ICD-10-CM | POA: Diagnosis not present

## 2013-11-26 DIAGNOSIS — E785 Hyperlipidemia, unspecified: Secondary | ICD-10-CM

## 2013-11-26 NOTE — Assessment & Plan Note (Signed)
All lipid parameters are within the desirable range on the current medical regimen

## 2013-11-26 NOTE — Assessment & Plan Note (Signed)
No symptoms of coronary insufficiency

## 2013-11-26 NOTE — Assessment & Plan Note (Signed)
Status post carotid subclavian bypass. Asymptomatic. Time to recheck her duplex ultrasound

## 2013-11-26 NOTE — Progress Notes (Signed)
Patient ID: Traci [name], female   DOB: [birth year], 68 y.o.   MRN: [id number]     Reason for office visit PAD, hypertension, nonocclusive CAD  Marella feels well. She went on a trip to [place] and walked a lot without any problems with intermittent claudication, angina or dyspnea on exertion.  Traci [name] has long-standing history of peripheral arterial disease with previous stents and surgical revascularization of the left subclavian artery for subclavian steal syndrome and presyncope. She eventually underwent a left carotid to subclavian bypass. In mid 2014 she was seen in the emergency room with symptomatic hyponatremia related to diuretic therapy. She feels much better after discontinuing the diuretic.  A lower extremity venous duplex study performed in August 2014 did not show significant insufficiency.  A lower extremity arterial duplex study performed in July 2014 showed bilateral ABIs of 1.1 without any significant obstructive lesions.  A carotid/left subclavian artery duplex ultrasound performed in May 2014 showed a widely patent bypass.  Echocardiography performed in June showed normal left ventricular systolic and diastolic function without any meaningful for abnormalities.  A nuclear myocardial perfusion study in March was normal.  Allergies  Allergen Reactions  . Cortisone Anaphylaxis  . Dilaudid [Hydromorphone Hcl] Nausea And Vomiting    Pt states she will start vomiting immediately for 6 hours  . Iodine Anaphylaxis  . Medrol [Methylprednisolone] Anaphylaxis  . Omnipaque [Iohexol] Anaphylaxis  . Prednisone Anaphylaxis  . Shellfish Allergy Anaphylaxis  . Sulfa Drugs Cross Reactors Anaphylaxis  . Codeine Rash  . Erythromycin Rash    Current Outpatient Prescriptions  Medication Sig Dispense Refill  . aspirin EC 81 MG tablet Take 81 mg by mouth daily.      Marland Kitchen atenolol (TENORMIN) 25 MG tablet Take 1 tablet (25 mg total) by mouth daily.  30 tablet  11  .  atorvastatin (LIPITOR) 10 MG tablet Take 1 tablet (10 mg total) by mouth every evening.  30 tablet  10  . candesartan (ATACAND) 16 MG tablet Take 0.5 tablets (8 mg total) by mouth every morning.  15 tablet  4  . Cholecalciferol (VITAMIN D3) 2000 UNITS TABS Take 2,000 mg by mouth daily.      . clopidogrel (PLAVIX) 75 MG tablet Take 1 tablet (75 mg total) by mouth daily.  30 tablet  10  . clorazepate (TRANXENE) 7.5 MG tablet Take 1 tablet (7.5 mg total) by mouth daily.  30 tablet  3  . cyanocobalamin 500 MCG tablet Take 500 mcg by mouth daily.      Marland Kitchen dexlansoprazole (DEXILANT) 60 MG capsule Take 60 mg by mouth daily.       Marland Kitchen EPINEPHrine (EPIPEN 2-PAK) 0.3 mg/0.3 mL DEVI Inject 0.3 mg into the muscle as needed (for allergic reaction).      Marland Kitchen estradiol (ESTRACE) 0.5 MG tablet Take 0.5 mg by mouth daily.      . fexofenadine (ALLEGRA) 180 MG tablet Take 180 mg by mouth daily.      . furosemide (LASIX) 20 MG tablet Take 20 mg by mouth every other day. Take one tablet Mon/Wed/Fri.      . meclizine (ANTIVERT) 25 MG tablet Take 12.5 mg by mouth 3 (three) times daily as needed.       . Multiple Vitamin (MULTIVITAMIN WITH MINERALS) TABS Take 1 tablet by mouth daily.      . sucralfate (CARAFATE) 1 G tablet Take 1 g by mouth 4 (four) times daily.      Marland Kitchen zolpidem (AMBIEN) 10 MG  tablet Take 10 mg by mouth at bedtime as needed for sleep.       No current facility-administered medications for this visit.    Past Medical History  Diagnosis Date  . Hypertension 11/30/2010    echo- EF 55%; normal w/ mildly sclerotic aortic valve  . Hyperlipidemia   . Coronary artery disease   . GERD (gastroesophageal reflux disease)   . Osteopenia   . Sigmoid diverticulitis   . Peripheral vascular disease   . Atrial fibrillation   . Nonspecific ST-T wave electrocardiographic changes 03/26/2011    R/Lmv - EF 74%, normal perfusion all regions, ST depression w/ Lexiscan infusion w/o assoc angina  . CAD (coronary artery  disease)   . PAD (peripheral artery disease)   . PVD (peripheral vascular disease) 11/05/2011    doppler - R/L brachial pressures essentially equal w/o inflow disease; L sublclavian/CCA bypass graft demonstrates patent flow, no evidence of significant stenosis  . Hypertension 11/05/11    renal dopplers - celiac artery and SMA >50% diameter reduction, R renal artery - mildly elevated velocities 1-59% diameter reduction, L renal artery normal  . Claudication 01/06/2008    Lower extremity dopplers - no evidence of arterial insufficiency, normal exam    Past Surgical History  Procedure Laterality Date  . Cholecystectomy    . Appendectomy    . Abdominal hysterectomy    . Subclavian artery stents  01/23/2010    Left carotid to subclavian artery Bypass  . Spine surgery  Feb. 2011  . Coronary angioplasty  1995    X's  several  . Cardiac catheterization  09/20/2008    LSA ISR 7x68mm Cordis Genesis on Opta premount, reduction from 80% to 0%    Family History  Problem Relation Age of Onset  . Heart disease Father     Heart Disease before age 49  . Kidney disease Father   . Heart attack Father   . Hyperlipidemia Father   . Hypertension Father   . Diabetes Mother     Varicose Veins  . Diabetes Maternal Grandmother   . Heart disease Paternal Grandfather     History   Social History  . Marital Status: Legally Separated    Spouse Name: N/A    Number of Children: N/A  . Years of Education: N/A   Occupational History  . Not on file.   Social History Main Topics  . Smoking status: Former Smoker    Types: Cigarettes    Quit date: 09/30/1993  . Smokeless tobacco: Never Used  . Alcohol Use: No  . Drug Use: No  . Sexual Activity: Not on file   Other Topics Concern  . Not on file   Social History Narrative  . No narrative on file    Review of systems: The patient specifically denies any chest pain at rest or with exertion, dyspnea at rest or with exertion, orthopnea, paroxysmal  nocturnal dyspnea, syncope, palpitations, focal neurological deficits, intermittent claudication, lower extremity edema, unexplained weight gain, cough, hemoptysis or wheezing.  The patient also denies abdominal pain, nausea, vomiting, dysphagia, diarrhea, constipation, polyuria, polydipsia, dysuria, hematuria, frequency, urgency, abnormal bleeding or bruising, fever, chills, unexpected weight changes, mood swings, change in skin or hair texture, change in voice quality, auditory or visual problems, allergic reactions or rashes, new musculoskeletal complaints other than usual "aches and pains".   PHYSICAL EXAM BP 142/64  Pulse 60  Resp 16  Ht 5\' 4"  (1.626 m)  Wt 157 lb 4.8 oz (71.351 kg)  BMI 26.99 kg/m2  General: Alert, oriented x3, no distress  Head: no evidence of trauma, PERRL, EOMI, no exophtalmos or lid lag, no myxedema, no xanthelasma; normal ears, nose and oropharynx  Neck: normal jugular venous pulsations and no hepatojugular reflux; brisk carotid pulses without delay and faint bilateral carotid bruits; left supraclavicular scar of previous bypass procedure ; the soft tissues are chronically prominent there Chest: clear to auscultation, no signs of consolidation by percussion or palpation, normal fremitus, symmetrical and full respiratory excursions  Cardiovascular: normal position and quality of the apical impulse, regular rhythm, normal first and second heart sounds, no murmurs, rubs or gallops  Abdomen: no tenderness or distention, no masses by palpation, no abnormal pulsatility or arterial bruits, normal bowel sounds, no hepatosplenomegaly  Extremities: no clubbing, cyanosis or edema; 2+ radial, ulnar and brachial pulses bilaterally; 2+ right femoral, posterior tibial and dorsalis pedis pulses; 2+ left femoral, posterior tibial and dorsalis pedis pulses; no subclavian or femoral bruits  Neurological: grossly nonfocal  EKG: Sinus rhythm with nonspecific T-wave changes  Lipid Panel      Component Value Date/Time   CHOL  Value: 132        ATP III CLASSIFICATION:  <200     mg/dL   Desirable  200-239  mg/dL   Borderline High  >=240    mg/dL   High 05/25/2008 1440   TRIG 136 04/07/2013 0857   HDL 47 04/07/2013 0857   HDL 47 05/25/2008 1440   CHOLHDL 3.0 04/07/2013 0857   CHOLHDL 2.8 05/25/2008 1440   VLDL 16 05/25/2008 1440   LDLCALC 67 04/07/2013 0857   LDLCALC  Value: 69        Total Cholesterol/HDL:CHD Risk Coronary Heart Disease Risk Table                     Men   Women  1/2 Average Risk   3.4   3.3 05/25/2008 1440    BMET    Component Value Date/Time   NA 144 04/07/2013 0857   NA 134* 12/07/2012 0403   K 4.5 04/07/2013 0857   CL 101 04/07/2013 0857   CO2 28 04/07/2013 0857   GLUCOSE 101* 04/07/2013 0857   GLUCOSE 97 12/07/2012 0403   BUN 10 04/07/2013 0857   BUN 8 12/07/2012 0403   CREATININE 0.86 04/07/2013 0857   CALCIUM 9.0 04/07/2013 0857   GFRNONAA 71 04/07/2013 0857   GFRAA 81 04/07/2013 0857     ASSESSMENT AND PLAN Subclavian artery stenosis, left Status post carotid subclavian bypass. Asymptomatic. Time to recheck her duplex ultrasound  Hypertension Fair control  Hyperlipidemia All lipid parameters are within the desirable range on the current medical regimen  Coronary artery disease, non-occlusive, last cath 2009 No symptoms of coronary insufficiency   Orders Placed This Encounter  Procedures  . EKG 12-Lead  . Doppler carotid  . Upper Extremity Arterial Doppler Left   No orders of the defined types were placed in this encounter.    Holli Humbles, MD, [place] [phone] office [phone] pager

## 2013-11-26 NOTE — Patient Instructions (Signed)
Your physician has requested that you have a carotid duplex. This test is an ultrasound of the carotid arteries in your neck. It looks at blood flow through these arteries that supply the brain with blood. Allow one hour for this exam. There are no restrictions or special instructions.  Your physician has requested that you have a lower or upper extremity arterial duplex. This test is an ultrasound of the arteries in the legs or arms. It looks at arterial blood flow in the legs and arms. Allow one hour for Lower and Upper Arterial scans. There are no restrictions or special instructions  Dr. Sallyanne Kuster recommends that you schedule a follow-up appointment in: One year.

## 2013-11-26 NOTE — Assessment & Plan Note (Signed)
Fair control.

## 2013-12-06 DIAGNOSIS — E785 Hyperlipidemia, unspecified: Secondary | ICD-10-CM | POA: Diagnosis not present

## 2013-12-06 DIAGNOSIS — N23 Unspecified renal colic: Secondary | ICD-10-CM | POA: Diagnosis not present

## 2013-12-06 DIAGNOSIS — I10 Essential (primary) hypertension: Secondary | ICD-10-CM | POA: Diagnosis not present

## 2013-12-07 DIAGNOSIS — H2510 Age-related nuclear cataract, unspecified eye: Secondary | ICD-10-CM | POA: Diagnosis not present

## 2013-12-07 DIAGNOSIS — H43819 Vitreous degeneration, unspecified eye: Secondary | ICD-10-CM | POA: Diagnosis not present

## 2013-12-07 DIAGNOSIS — H33329 Round hole, unspecified eye: Secondary | ICD-10-CM | POA: Diagnosis not present

## 2013-12-07 DIAGNOSIS — H35379 Puckering of macula, unspecified eye: Secondary | ICD-10-CM | POA: Diagnosis not present

## 2013-12-09 ENCOUNTER — Encounter (HOSPITAL_COMMUNITY): Payer: Medicare Other

## 2013-12-13 ENCOUNTER — Other Ambulatory Visit: Payer: Self-pay | Admitting: *Deleted

## 2013-12-13 DIAGNOSIS — B070 Plantar wart: Secondary | ICD-10-CM | POA: Diagnosis not present

## 2013-12-13 MED ORDER — ATENOLOL 25 MG PO TABS: 25.0000 mg | ORAL_TABLET | Freq: Every day | ORAL | Status: DC

## 2013-12-14 ENCOUNTER — Other Ambulatory Visit (HOSPITAL_COMMUNITY): Payer: Self-pay | Admitting: Cardiovascular Disease

## 2013-12-14 ENCOUNTER — Ambulatory Visit (HOSPITAL_BASED_OUTPATIENT_CLINIC_OR_DEPARTMENT_OTHER)
Admission: RE | Admit: 2013-12-14 | Discharge: 2013-12-14 | Disposition: A | Discharge status: Home / Self Care | Payer: Medicare Other | Source: Ambulatory Visit | Attending: Cardiovascular Disease | Admitting: Cardiovascular Disease

## 2013-12-14 ENCOUNTER — Ambulatory Visit (HOSPITAL_COMMUNITY)
Admission: RE | Admit: 2013-12-14 | Discharge: 2013-12-14 | Disposition: A | Discharge status: Home / Self Care | Payer: Medicare Other | Source: Ambulatory Visit | Attending: Cardiovascular Disease | Admitting: Cardiovascular Disease

## 2013-12-14 DIAGNOSIS — I739 Peripheral vascular disease, unspecified: Secondary | ICD-10-CM | POA: Diagnosis not present

## 2013-12-14 DIAGNOSIS — I6529 Occlusion and stenosis of unspecified carotid artery: Secondary | ICD-10-CM

## 2013-12-14 DIAGNOSIS — I708 Atherosclerosis of other arteries: Secondary | ICD-10-CM

## 2013-12-14 DIAGNOSIS — I771 Stricture of artery: Secondary | ICD-10-CM

## 2013-12-14 DIAGNOSIS — I6522 Occlusion and stenosis of left carotid artery: Secondary | ICD-10-CM

## 2013-12-14 NOTE — Progress Notes (Signed)
Left Upper Ext. Arterial Duplex Completed. Rita Sturdivant, BS, RDMS, RVT  

## 2013-12-14 NOTE — Progress Notes (Signed)
Carotid Duplex Completed. Porschea Borys, BS, RDMS, RVT  

## 2013-12-15 DIAGNOSIS — H33329 Round hole, unspecified eye: Secondary | ICD-10-CM | POA: Diagnosis not present

## 2013-12-30 ENCOUNTER — Ambulatory Visit: Payer: Medicare Other | Admitting: Cardiovascular Disease

## 2014-01-10 DIAGNOSIS — M47817 Spondylosis without myelopathy or radiculopathy, lumbosacral region: Secondary | ICD-10-CM | POA: Diagnosis not present

## 2014-01-10 DIAGNOSIS — M545 Low back pain, unspecified: Secondary | ICD-10-CM | POA: Diagnosis not present

## 2014-01-10 DIAGNOSIS — IMO0002 Reserved for concepts with insufficient information to code with codable children: Secondary | ICD-10-CM | POA: Diagnosis not present

## 2014-01-10 DIAGNOSIS — Z6826 Body mass index (BMI) 26.0-26.9, adult: Secondary | ICD-10-CM | POA: Diagnosis not present

## 2014-01-11 ENCOUNTER — Telehealth (HOSPITAL_COMMUNITY): Payer: Self-pay | Admitting: *Deleted

## 2014-01-11 NOTE — Telephone Encounter (Signed)
Pt states that Dr.C wants her to have an echo.  There isnt anything stating that on the AVS and there isnt an order, However the patient said he mentioned it during the ov and its written on the billing form.  Help!  [employer]

## 2014-01-12 NOTE — Telephone Encounter (Signed)
Tell her I am sorry, but must be a misunderstanding. She had an echo June of last year that was normal. I do not think she needs another one. Not sure why it is on billing sheet

## 2014-01-12 NOTE — Telephone Encounter (Signed)
Message deferred to Dr. Sallyanne Kuster to advise..   There is nothing in OV note indicating echo is necessary. Will order if needed.

## 2014-01-12 NOTE — Telephone Encounter (Signed)
Patient notified

## 2014-01-13 DIAGNOSIS — R109 Unspecified abdominal pain: Secondary | ICD-10-CM | POA: Diagnosis not present

## 2014-01-16 ENCOUNTER — Emergency Department (HOSPITAL_COMMUNITY): Payer: Medicare Other

## 2014-01-16 ENCOUNTER — Encounter (HOSPITAL_COMMUNITY): Payer: Self-pay | Admitting: Emergency Medicine

## 2014-01-16 ENCOUNTER — Inpatient Hospital Stay (HOSPITAL_COMMUNITY)
Admission: EM | Admit: 2014-01-16 | Discharge: 2014-01-19 | DRG: 395 | Disposition: A | Discharge status: Home / Self Care | Payer: Medicare Other | Attending: Vascular Surgery | Admitting: Vascular Surgery

## 2014-01-16 ENCOUNTER — Inpatient Hospital Stay (HOSPITAL_COMMUNITY): Payer: Medicare Other

## 2014-01-16 DIAGNOSIS — I708 Atherosclerosis of other arteries: Secondary | ICD-10-CM

## 2014-01-16 DIAGNOSIS — R634 Abnormal weight loss: Secondary | ICD-10-CM | POA: Diagnosis present

## 2014-01-16 DIAGNOSIS — R109 Unspecified abdominal pain: Secondary | ICD-10-CM | POA: Diagnosis not present

## 2014-01-16 DIAGNOSIS — I2510 Atherosclerotic heart disease of native coronary artery without angina pectoris: Secondary | ICD-10-CM | POA: Diagnosis not present

## 2014-01-16 DIAGNOSIS — M949 Disorder of cartilage, unspecified: Secondary | ICD-10-CM

## 2014-01-16 DIAGNOSIS — I739 Peripheral vascular disease, unspecified: Secondary | ICD-10-CM | POA: Diagnosis not present

## 2014-01-16 DIAGNOSIS — K219 Gastro-esophageal reflux disease without esophagitis: Secondary | ICD-10-CM | POA: Diagnosis present

## 2014-01-16 DIAGNOSIS — Z01810 Encounter for preprocedural cardiovascular examination: Secondary | ICD-10-CM | POA: Diagnosis not present

## 2014-01-16 DIAGNOSIS — K551 Chronic vascular disorders of intestine: Secondary | ICD-10-CM | POA: Diagnosis not present

## 2014-01-16 DIAGNOSIS — R935 Abnormal findings on diagnostic imaging of other abdominal regions, including retroperitoneum: Secondary | ICD-10-CM | POA: Diagnosis not present

## 2014-01-16 DIAGNOSIS — I10 Essential (primary) hypertension: Secondary | ICD-10-CM | POA: Diagnosis not present

## 2014-01-16 DIAGNOSIS — Z01818 Encounter for other preprocedural examination: Secondary | ICD-10-CM | POA: Diagnosis not present

## 2014-01-16 DIAGNOSIS — K5900 Constipation, unspecified: Secondary | ICD-10-CM | POA: Diagnosis present

## 2014-01-16 DIAGNOSIS — Z7982 Long term (current) use of aspirin: Secondary | ICD-10-CM

## 2014-01-16 DIAGNOSIS — M899 Disorder of bone, unspecified: Secondary | ICD-10-CM | POA: Diagnosis present

## 2014-01-16 DIAGNOSIS — Z87891 Personal history of nicotine dependence: Secondary | ICD-10-CM | POA: Diagnosis not present

## 2014-01-16 DIAGNOSIS — K55059 Acute (reversible) ischemia of intestine, part and extent unspecified: Secondary | ICD-10-CM | POA: Diagnosis present

## 2014-01-16 DIAGNOSIS — E785 Hyperlipidemia, unspecified: Secondary | ICD-10-CM | POA: Diagnosis present

## 2014-01-16 DIAGNOSIS — I4891 Unspecified atrial fibrillation: Secondary | ICD-10-CM | POA: Diagnosis present

## 2014-01-16 DIAGNOSIS — K2970 Gastritis, unspecified, without bleeding: Secondary | ICD-10-CM | POA: Diagnosis not present

## 2014-01-16 DIAGNOSIS — K2990 Gastroduodenitis, unspecified, without bleeding: Secondary | ICD-10-CM | POA: Diagnosis not present

## 2014-01-16 DIAGNOSIS — R10819 Abdominal tenderness, unspecified site: Secondary | ICD-10-CM | POA: Diagnosis not present

## 2014-01-16 DIAGNOSIS — R072 Precordial pain: Secondary | ICD-10-CM | POA: Diagnosis not present

## 2014-01-16 LAB — COMPREHENSIVE METABOLIC PANEL
ALT: 22 U/L (ref 0–35)
AST: 26 U/L (ref 0–37)
Albumin: 3.7 g/dL (ref 3.5–5.2)
Alkaline Phosphatase: 65 U/L (ref 39–117)
Anion gap: 14 (ref 5–15)
BILIRUBIN TOTAL: 0.4 mg/dL (ref 0.3–1.2)
BUN: 12 mg/dL (ref 6–23)
CALCIUM: 8.5 mg/dL (ref 8.4–10.5)
CHLORIDE: 105 meq/L (ref 96–112)
CO2: 22 meq/L (ref 19–32)
Creatinine, Ser: 0.8 mg/dL (ref 0.50–1.10)
GFR calc Af Amer: 86 mL/min — ABNORMAL LOW (ref >90)
GFR, EST NON AFRICAN AMERICAN: 75 mL/min — AB (ref >90)
Glucose, Bld: 107 mg/dL — ABNORMAL HIGH (ref 70–99)
Potassium: 3.7 mEq/L (ref 3.7–5.3)
SODIUM: 141 meq/L (ref 137–147)
Total Protein: 6.8 g/dL (ref 6.0–8.3)

## 2014-01-16 LAB — I-STAT TROPONIN, ED: Troponin i, poc: 0.01 ng/mL (ref 0.00–0.08)

## 2014-01-16 LAB — CBC WITH DIFFERENTIAL/PLATELET
BASOS ABS: 0 10*3/uL (ref 0.0–0.1)
Basophils Relative: 1 % (ref 0–1)
Eosinophils Absolute: 0.5 10*3/uL (ref 0.0–0.7)
Eosinophils Relative: 8 % — ABNORMAL HIGH (ref 0–5)
HCT: 37 % (ref 36.0–46.0)
Hemoglobin: 12.5 g/dL (ref 12.0–15.0)
LYMPHS PCT: 41 % (ref 12–46)
Lymphs Abs: 2.6 10*3/uL (ref 0.7–4.0)
MCH: 29.4 pg (ref 26.0–34.0)
MCHC: 33.8 g/dL (ref 30.0–36.0)
MCV: 87.1 fL (ref 78.0–100.0)
Monocytes Absolute: 0.4 10*3/uL (ref 0.1–1.0)
Monocytes Relative: 6 % (ref 3–12)
NEUTROS ABS: 2.8 10*3/uL (ref 1.7–7.7)
NEUTROS PCT: 44 % (ref 43–77)
PLATELETS: 269 10*3/uL (ref 150–400)
RBC: 4.25 MIL/uL (ref 3.87–5.11)
RDW: 12.5 % (ref 11.5–15.5)
WBC: 6.3 10*3/uL (ref 4.0–10.5)

## 2014-01-16 LAB — I-STAT CG4 LACTIC ACID, ED: Lactic Acid, Venous: 1.15 mmol/L (ref 0.5–2.2)

## 2014-01-16 LAB — URINALYSIS, ROUTINE W REFLEX MICROSCOPIC
Bilirubin Urine: NEGATIVE
Glucose, UA: NEGATIVE mg/dL
Hgb urine dipstick: NEGATIVE
Ketones, ur: NEGATIVE mg/dL
Nitrite: NEGATIVE
PROTEIN: NEGATIVE mg/dL
Specific Gravity, Urine: 1.011 (ref 1.005–1.030)
Urobilinogen, UA: 0.2 mg/dL (ref 0.0–1.0)
pH: 5.5 (ref 5.0–8.0)

## 2014-01-16 LAB — PROTIME-INR
INR: 1.16 (ref 0.00–1.49)
Prothrombin Time: 14.8 seconds (ref 11.6–15.2)

## 2014-01-16 LAB — URINE MICROSCOPIC-ADD ON

## 2014-01-16 LAB — HEPARIN LEVEL (UNFRACTIONATED): Heparin Unfractionated: 1.38 IU/mL — ABNORMAL HIGH (ref 0.30–0.70)

## 2014-01-16 MED ORDER — HYDRALAZINE HCL 20 MG/ML IJ SOLN
10.0000 mg | INTRAMUSCULAR | Status: DC | PRN
Start: 2014-01-16 — End: 2014-01-19

## 2014-01-16 MED ORDER — CLORAZEPATE DIPOTASSIUM 7.5 MG PO TABS
7.5000 mg | ORAL_TABLET | Freq: Every day | ORAL | Status: DC
  Administered 2014-01-16 – 2014-01-19 (×4): 7.5 mg via ORAL
  Filled 2014-01-16 (×4): qty 2

## 2014-01-16 MED ORDER — DEXTROSE-NACL 5-0.45 % IV SOLN
INTRAVENOUS | Status: DC
  Administered 2014-01-16 – 2014-01-17 (×3): via INTRAVENOUS

## 2014-01-16 MED ORDER — DOCUSATE SODIUM 100 MG PO CAPS
100.0000 mg | ORAL_CAPSULE | Freq: Two times a day (BID) | ORAL | Status: DC
  Administered 2014-01-16 – 2014-01-19 (×7): 100 mg via ORAL
  Filled 2014-01-16 (×9): qty 1

## 2014-01-16 MED ORDER — FUROSEMIDE 20 MG PO TABS
20.0000 mg | ORAL_TABLET | ORAL | Status: DC
  Administered 2014-01-17 – 2014-01-19 (×2): 20 mg via ORAL
  Filled 2014-01-16 (×3): qty 1

## 2014-01-16 MED ORDER — PHENOL 1.4 % MT LIQD
1.0000 | OROMUCOSAL | Status: DC | PRN
  Filled 2014-01-16: qty 177

## 2014-01-16 MED ORDER — ALUM & MAG HYDROXIDE-SIMETH 200-200-20 MG/5ML PO SUSP
15.0000 mL | ORAL | Status: DC | PRN
  Administered 2014-01-17 – 2014-01-18 (×2): 30 mL via ORAL
  Filled 2014-01-16 (×2): qty 30

## 2014-01-16 MED ORDER — FEXOFENADINE HCL 180 MG PO TABS
180.0000 mg | ORAL_TABLET | Freq: Every day | ORAL | Status: DC
  Administered 2014-01-16 – 2014-01-19 (×4): 180 mg via ORAL
  Filled 2014-01-16 (×6): qty 1

## 2014-01-16 MED ORDER — NON FORMULARY: Freq: Every day | Status: DC

## 2014-01-16 MED ORDER — MECLIZINE HCL 12.5 MG PO TABS
12.5000 mg | ORAL_TABLET | Freq: Three times a day (TID) | ORAL | Status: DC | PRN
  Filled 2014-01-16: qty 1

## 2014-01-16 MED ORDER — ESTRADIOL 1 MG PO TABS
0.5000 mg | ORAL_TABLET | Freq: Every day | ORAL | Status: DC
  Administered 2014-01-16 – 2014-01-19 (×4): 0.5 mg via ORAL
  Filled 2014-01-16 (×4): qty 0.5

## 2014-01-16 MED ORDER — DIPHENHYDRAMINE HCL 50 MG/ML IJ SOLN
50.0000 mg | Freq: Four times a day (QID) | INTRAMUSCULAR | Status: DC
  Administered 2014-01-16 – 2014-01-18 (×8): 50 mg via INTRAVENOUS
  Filled 2014-01-16 (×17): qty 1

## 2014-01-16 MED ORDER — ONDANSETRON HCL 4 MG/2ML IJ SOLN: 4.0000 mg | Freq: Four times a day (QID) | INTRAMUSCULAR | Status: DC | PRN

## 2014-01-16 MED ORDER — ATENOLOL 25 MG PO TABS
25.0000 mg | ORAL_TABLET | Freq: Every day | ORAL | Status: DC
  Administered 2014-01-16 – 2014-01-19 (×3): 25 mg via ORAL
  Filled 2014-01-16 (×5): qty 1

## 2014-01-16 MED ORDER — POTASSIUM CHLORIDE CRYS ER 20 MEQ PO TBCR
20.0000 meq | EXTENDED_RELEASE_TABLET | Freq: Once | ORAL | Status: AC
  Administered 2014-01-16: 20 meq via ORAL
  Filled 2014-01-16: qty 2

## 2014-01-16 MED ORDER — HEPARIN BOLUS VIA INFUSION
4000.0000 [IU] | Freq: Once | INTRAVENOUS | Status: AC
  Administered 2014-01-16: 4000 [IU] via INTRAVENOUS
  Filled 2014-01-16: qty 4000

## 2014-01-16 MED ORDER — ASPIRIN EC 81 MG PO TBEC
81.0000 mg | DELAYED_RELEASE_TABLET | Freq: Every day | ORAL | Status: DC
  Administered 2014-01-16 – 2014-01-19 (×4): 81 mg via ORAL
  Filled 2014-01-16 (×4): qty 1

## 2014-01-16 MED ORDER — FENTANYL CITRATE 0.05 MG/ML IJ SOLN: 12.5000 ug | INTRAMUSCULAR | Status: DC | PRN

## 2014-01-16 MED ORDER — GADOBENATE DIMEGLUMINE 529 MG/ML IV SOLN
15.0000 mL | Freq: Once | INTRAVENOUS | Status: AC | PRN
  Administered 2014-01-16: 15 mL via INTRAVENOUS

## 2014-01-16 MED ORDER — METOPROLOL TARTRATE 1 MG/ML IV SOLN: 2.0000 mg | INTRAVENOUS | Status: DC | PRN

## 2014-01-16 MED ORDER — CANDESARTAN CILEXETIL 8 MG PO TABS
16.0000 mg | ORAL_TABLET | Freq: Every day | ORAL | Status: DC
  Filled 2014-01-16: qty 2

## 2014-01-16 MED ORDER — VITAMIN B-12 1000 MCG PO TABS
1000.0000 ug | ORAL_TABLET | Freq: Every day | ORAL | Status: DC
  Administered 2014-01-16 – 2014-01-19 (×4): 1000 ug via ORAL
  Filled 2014-01-16 (×4): qty 1

## 2014-01-16 MED ORDER — ACETAMINOPHEN 325 MG PO TABS: 325.0000 mg | ORAL_TABLET | ORAL | Status: DC | PRN

## 2014-01-16 MED ORDER — SUCRALFATE 1 G PO TABS
1.0000 g | ORAL_TABLET | Freq: Four times a day (QID) | ORAL | Status: DC
  Administered 2014-01-16 – 2014-01-19 (×11): 1 g via ORAL
  Filled 2014-01-16 (×14): qty 1

## 2014-01-16 MED ORDER — ONDANSETRON HCL 4 MG/2ML IJ SOLN
4.0000 mg | Freq: Once | INTRAMUSCULAR | Status: AC
  Administered 2014-01-16: 4 mg via INTRAVENOUS
  Filled 2014-01-16: qty 2

## 2014-01-16 MED ORDER — ACETAMINOPHEN 650 MG RE SUPP: 325.0000 mg | RECTAL | Status: DC | PRN

## 2014-01-16 MED ORDER — ZOLPIDEM TARTRATE 5 MG PO TABS
10.0000 mg | ORAL_TABLET | Freq: Every evening | ORAL | Status: DC | PRN
  Administered 2014-01-17 – 2014-01-18 (×2): 10 mg via ORAL
  Filled 2014-01-16 (×2): qty 2

## 2014-01-16 MED ORDER — ATORVASTATIN CALCIUM 10 MG PO TABS
10.0000 mg | ORAL_TABLET | Freq: Every evening | ORAL | Status: DC
  Administered 2014-01-16 – 2014-01-18 (×3): 10 mg via ORAL
  Filled 2014-01-16 (×4): qty 1

## 2014-01-16 MED ORDER — LABETALOL HCL 5 MG/ML IV SOLN
10.0000 mg | INTRAVENOUS | Status: DC | PRN
  Filled 2014-01-16: qty 4

## 2014-01-16 MED ORDER — CLOPIDOGREL BISULFATE 75 MG PO TABS
75.0000 mg | ORAL_TABLET | Freq: Every day | ORAL | Status: DC
  Administered 2014-01-16 – 2014-01-17 (×2): 75 mg via ORAL
  Filled 2014-01-16 (×2): qty 1

## 2014-01-16 MED ORDER — VITAMIN D3 25 MCG (1000 UNIT) PO TABS
1000.0000 [IU] | ORAL_TABLET | Freq: Every day | ORAL | Status: DC
  Administered 2014-01-16 – 2014-01-19 (×4): 1000 [IU] via ORAL
  Filled 2014-01-16 (×4): qty 1

## 2014-01-16 MED ORDER — PANTOPRAZOLE SODIUM 40 MG IV SOLR
40.0000 mg | Freq: Every day | INTRAVENOUS | Status: DC
  Administered 2014-01-16: 40 mg via INTRAVENOUS
  Filled 2014-01-16 (×2): qty 40

## 2014-01-16 MED ORDER — GUAIFENESIN-DM 100-10 MG/5ML PO SYRP: 15.0000 mL | ORAL_SOLUTION | ORAL | Status: DC | PRN

## 2014-01-16 MED ORDER — HEPARIN (PORCINE) IN NACL 100-0.45 UNIT/ML-% IJ SOLN
600.0000 [IU]/h | INTRAMUSCULAR | Status: DC
  Administered 2014-01-16: 1000 [IU]/h via INTRAVENOUS
  Filled 2014-01-16 (×2): qty 250

## 2014-01-16 MED ORDER — IRBESARTAN 150 MG PO TABS
150.0000 mg | ORAL_TABLET | Freq: Every day | ORAL | Status: DC
  Administered 2014-01-16 – 2014-01-19 (×4): 150 mg via ORAL
  Filled 2014-01-16 (×4): qty 1

## 2014-01-16 NOTE — H&P (Signed)
[facility] [place] HISTORY AND PHYSICAL   History of Present Illness:  Patient is a 68 y.o. year old female who presents for evaluation of mesenteric ischemia.  Pt has several month history of post prandial abdominal pain.  No nausea or vomiting but some constipation.  Pain feels like a tightening and twisting in her abdomen and usually resolves over an hour. She has lost approximately 10 lbs over the last 6 months.  She came to the ER this morning because for the first time she had abdominal pain that woke her from sleep.  The pain has now resolved.  She denies fever or chills.  She has previously had cholecystectomy, appendectomy and hysterectomy.  She has a known history of peripheral arterial disease and underwent a left carotid subclavian bypass by my partner Dr Scot Dock in 2011.  Risk factors include afib (on Plavix for this, does not know why she was not started on full anticoagulation), hypertension, coronary artery disease, hyperlipidemia.  She states she has had anaphylactic reaction with contrast in the past but stated she did ok in 2011 with cath after being given scheduled Benadryl 24 hr in advance.  Other medical problems include reflux which has been stable.  Past Medical History  Diagnosis Date  . Hypertension 11/30/2010    echo- EF 55%; normal w/ mildly sclerotic aortic valve  . Hyperlipidemia   . Coronary artery disease   . GERD (gastroesophageal reflux disease)   . Osteopenia   . Sigmoid diverticulitis   . Peripheral vascular disease   . Atrial fibrillation   . Nonspecific ST-T wave electrocardiographic changes 03/26/2011    R/Lmv - EF 74%, normal perfusion all regions, ST depression w/ Lexiscan infusion w/o assoc angina  . CAD (coronary artery disease)   . PAD (peripheral artery disease)   . PVD (peripheral vascular disease) 11/05/2011    doppler - R/L brachial pressures essentially equal w/o inflow disease; L sublclavian/CCA bypass graft demonstrates  patent flow, no evidence of significant stenosis  . Hypertension 11/05/11    renal dopplers - celiac artery and SMA >50% diameter reduction, R renal artery - mildly elevated velocities 1-59% diameter reduction, L renal artery normal  . Claudication 01/06/2008    Lower extremity dopplers - no evidence of arterial insufficiency, normal exam    Past Surgical History  Procedure Laterality Date  . Cholecystectomy    . Appendectomy    . Abdominal hysterectomy    . Subclavian artery stents  01/23/2010    Left carotid to subclavian artery Bypass  . Spine surgery  Feb. 2011  . Coronary angioplasty  1995    X's  several  . Cardiac catheterization  09/20/2008    LSA ISR 7x60mm Cordis Genesis on Opta premount, reduction from 80% to 0%    Social History History  Substance Use Topics  . Smoking status: Former Smoker    Types: Cigarettes    Quit date: 09/30/1993  . Smokeless tobacco: Never Used  . Alcohol Use: No    Family History Family History  Problem Relation Age of Onset  . Heart disease Father     Heart Disease before age 11  . Kidney disease Father   . Heart attack Father   . Hyperlipidemia Father   . Hypertension Father   . Diabetes Mother     Varicose Veins  . Diabetes Maternal Grandmother   . Heart disease Paternal Grandfather     Allergies  Allergies  Allergen Reactions  . Cortisone Anaphylaxis  .  Dilaudid [Hydromorphone Hcl] Nausea And Vomiting    Pt states she will start vomiting immediately for 6 hours  . Iodine Anaphylaxis  . Medrol [Methylprednisolone] Anaphylaxis  . Omnipaque [Iohexol] Anaphylaxis  . Prednisone Anaphylaxis  . Shellfish Allergy Anaphylaxis  . Sulfa Drugs Cross Reactors Anaphylaxis  . Codeine Rash  . Erythromycin Rash     Current Facility-Administered Medications  Medication Dose Route Frequency Provider Last Rate Last Dose  . acetaminophen (TYLENOL) tablet 325-650 mg  325-650 mg Oral Q4H PRN Elam Dutch, MD       Or  . acetaminophen  (TYLENOL) suppository 325-650 mg  325-650 mg Rectal Q4H PRN Elam Dutch, MD      . alum & mag hydroxide-simeth (MAALOX/MYLANTA) 200-200-20 MG/5ML suspension 15-30 mL  15-30 mL Oral Q2H PRN Elam Dutch, MD      . aspirin EC tablet 81 mg  81 mg Oral Daily Elam Dutch, MD      . atenolol (TENORMIN) tablet 25 mg  25 mg Oral Daily Elam Dutch, MD      . atorvastatin (LIPITOR) tablet 10 mg  10 mg Oral QPM Elam Dutch, MD      . cholecalciferol (VITAMIN D) tablet 1,000 Units  1,000 Units Oral Daily Elam Dutch, MD      . clopidogrel (PLAVIX) tablet 75 mg  75 mg Oral Daily Elam Dutch, MD      . clorazepate (TRANXENE) tablet 7.5 mg  7.5 mg Oral Daily Elam Dutch, MD      . dextrose 5 %-0.45 % sodium chloride infusion   Intravenous Continuous Elam Dutch, MD      . diphenhydrAMINE (BENADRYL) injection 50 mg  50 mg Intravenous QID Elam Dutch, MD      . docusate sodium (COLACE) capsule 100 mg  100 mg Oral BID Elam Dutch, MD      . estradiol (ESTRACE) tablet 0.5 mg  0.5 mg Oral Daily Elam Dutch, MD      . fentaNYL (SUBLIMAZE) injection 12.5 mcg  12.5 mcg Intravenous Q1H PRN Elam Dutch, MD      . Derrill Memo ON 01/17/2014] furosemide (LASIX) tablet 20 mg  20 mg Oral Q M,W,F Elam Dutch, MD      . guaiFENesin-dextromethorphan (ROBITUSSIN DM) 100-10 MG/5ML syrup 15 mL  15 mL Oral Q4H PRN Elam Dutch, MD      . hydrALAZINE (APRESOLINE) injection 10 mg  10 mg Intravenous Q2H PRN Elam Dutch, MD      . labetalol (NORMODYNE,TRANDATE) injection 10 mg  10 mg Intravenous Q2H PRN Elam Dutch, MD      . meclizine (ANTIVERT) tablet 12.5 mg  12.5 mg Oral TID PRN Elam Dutch, MD      . metoprolol (LOPRESSOR) injection 2-5 mg  2-5 mg Intravenous Q2H PRN Elam Dutch, MD      . NON FORMULARY   Oral Daily Elam Dutch, MD      . NON FORMULARY   Oral Daily Elam Dutch, MD      . ondansetron [facility]) injection 4 mg  4 mg  Intravenous Q6H PRN Elam Dutch, MD      . pantoprazole (PROTONIX) injection 40 mg  40 mg Intravenous QHS Elam Dutch, MD      . phenol (CHLORASEPTIC) mouth spray 1 spray  1 spray Mouth/Throat PRN Elam Dutch, MD      . potassium  chloride SA (K-DUR,KLOR-CON) CR tablet 20-40 mEq  20-40 mEq Oral Once Elam Dutch, MD      . sucralfate (CARAFATE) tablet 1 g  1 g Oral QID Elam Dutch, MD      . vitamin B-12 (CYANOCOBALAMIN) tablet 1,000 mcg  1,000 mcg Oral Daily Elam Dutch, MD      . zolpidem (AMBIEN) tablet 10 mg  10 mg Oral QHS PRN Elam Dutch, MD       Current Outpatient Prescriptions  Medication Sig Dispense Refill  . aspirin EC 81 MG tablet Take 81 mg by mouth daily.      Marland Kitchen atenolol (TENORMIN) 25 MG tablet Take 1 tablet (25 mg total) by mouth daily.  30 tablet  11  . atorvastatin (LIPITOR) 10 MG tablet Take 1 tablet (10 mg total) by mouth every evening.  30 tablet  10  . candesartan (ATACAND) 16 MG tablet Take 0.5 tablets (8 mg total) by mouth every morning.  15 tablet  4  . cholecalciferol (VITAMIN D) 1000 UNITS tablet Take 1,000 Units by mouth daily.      . clopidogrel (PLAVIX) 75 MG tablet Take 1 tablet (75 mg total) by mouth daily.  30 tablet  10  . clorazepate (TRANXENE) 7.5 MG tablet Take 1 tablet (7.5 mg total) by mouth daily.  30 tablet  3  . dexlansoprazole (DEXILANT) 60 MG capsule Take 60 mg by mouth daily.       Marland Kitchen dicyclomine (BENTYL) 20 MG tablet Take 20 mg by mouth every 6 (six) hours as needed for spasms.      Marland Kitchen estradiol (ESTRACE) 0.5 MG tablet Take 0.5 mg by mouth daily.      . fexofenadine (ALLEGRA) 180 MG tablet Take 180 mg by mouth daily.      . furosemide (LASIX) 20 MG tablet Take 20 mg by mouth every Monday, Wednesday, and Friday.      . meclizine (ANTIVERT) 25 MG tablet Take 12.5 mg by mouth 3 (three) times daily as needed.       . Multiple Vitamin (MULTIVITAMIN WITH MINERALS) TABS Take 1 tablet by mouth daily.      . sucralfate  (CARAFATE) 1 G tablet Take 1 g by mouth 4 (four) times daily.      . vitamin B-12 (CYANOCOBALAMIN) 1000 MCG tablet Take 1,000 mcg by mouth daily.      Marland Kitchen zolpidem (AMBIEN) 10 MG tablet Take 10 mg by mouth at bedtime as needed for sleep.      Marland Kitchen EPINEPHrine (EPIPEN 2-PAK) 0.3 mg/0.3 mL DEVI Inject 0.3 mg into the muscle as needed (for allergic reaction).        ROS:   General:  + weight loss,no  Fever, chills  HEENT: No recent headaches, no nasal bleeding, no visual changes, no sore throat  Neurologic: No dizziness, blackouts, seizures. No recent symptoms of stroke or mini- stroke. No recent episodes of slurred speech, or temporary blindness.  Cardiac: No recent episodes of chest pain/pressure, no shortness of breath at rest.  + shortness of breath with exertion.  + history of atrial fibrillation or irregular heartbeat  Vascular: No history of rest pain in feet.  No history of claudication.  No history of non-healing ulcer, No history of DVT   Pulmonary: No home oxygen, no productive cough, no hemoptysis,  No asthma or wheezing  Musculoskeletal:  [ ]  Arthritis, [ ]  Low back pain,  [x ] Joint pain  Hematologic:No history of hypercoagulable state.  No history of easy bleeding.  No history of anemia  Gastrointestinal: No hematochezia or melena,  + gastroesophageal reflux, no trouble swallowing  Urinary: [ ]  chronic Kidney disease, [ ]  on HD - [ ]  MWF or [ ]  TTHS, [ ]  Burning with urination, [ ]  Frequent urination, [ ]  Difficulty urinating;   Skin: No rashes  Psychological: No history of anxiety,  No history of depression   Physical Examination  Filed Vitals:   01/16/14 0845 01/16/14 0930 01/16/14 1115 01/16/14 1300  BP: 133/46 144/58 131/45   Pulse: 62 64 66   Temp:      TempSrc:      Resp: 13 19 17    Height:    5\' 4"  (1.626 m)  Weight:    156 lb 8.4 oz (71 kg)  SpO2: 96% 98% 98%     Body mass index is 26.85 kg/(m^2).  General:  Alert and oriented, no acute distress HEENT:  Normal Neck: No bruit or JVD Pulmonary: Clear to auscultation bilaterally Cardiac: Regular Rate and Rhythm Abdomen: Soft, non-tender, non-distended, no mass, well healed midline scar Skin: No rash Extremity Pulses:  2+ radial, brachial, femoral, absent dorsalis pedis, 2+ posterior tibial pulses bilaterally Musculoskeletal: No deformity or edema  Neurologic: Upper and lower extremity motor 5/5 and symmetric  DATA:   CBC    Component Value Date/Time   WBC 6.3 01/16/2014 0454   RBC 4.25 01/16/2014 0454   RBC 4.06 09/20/2008 1054   HGB 12.5 01/16/2014 0454   HCT 37.0 01/16/2014 0454   PLT 269 01/16/2014 0454   MCV 87.1 01/16/2014 0454   MCH 29.4 01/16/2014 0454   MCHC 33.8 01/16/2014 0454   RDW 12.5 01/16/2014 0454   LYMPHSABS 2.6 01/16/2014 0454   MONOABS 0.4 01/16/2014 0454   EOSABS 0.5 01/16/2014 0454   BASOSABS 0.0 01/16/2014 0454     BMET    Component Value Date/Time   NA 141 01/16/2014 0454   NA 144 04/07/2013 0857   K 3.7 01/16/2014 0454   CL 105 01/16/2014 0454   CO2 22 01/16/2014 0454   GLUCOSE 107* 01/16/2014 0454   GLUCOSE 101* 04/07/2013 0857   BUN 12 01/16/2014 0454   BUN 10 04/07/2013 0857   CREATININE 0.80 01/16/2014 0454   CALCIUM 8.5 01/16/2014 0454   GFRNONAA 75* 01/16/2014 0454   GFRAA 86* 01/16/2014 0454   UA + leu Lactate 1.15 Trop 0.01 ALB 3.7 AST 26 ALT 22 ALP 65  MRA abdomen: 50% narrowing celiac, occlusion SMA, patent IMA, no obvious gut ischemia MRI lumbar spine Jule Ser 1/15 reviewed which showed 80% SMA at that time   ASSESSMENT:  Acute worsening of chronic mesenteric ischemia, currently asymptomatic   PLAN:  1.  Will need mesenteric angio tentatively tomorrow by either Dr Scot Dock or myself since MRA films are suboptimal  2.  NPO except meds 3.  Heparin drip for now will d/c heparin drip on call to [facility] lab tomorrow 4.  Scheduled benadryl q6h starting today precontrast   Ruta Hinds, MD [facility] [place] Office: [phone] Pager:  [phone]

## 2014-01-16 NOTE — ED Notes (Signed)
Pt. Of dr. Carol Ada.  Abd. Pain and distention x 2 weeks.  No bm x 5 days. Pt. Might have IBS - prescribed bentyl. Started running a fever Friday. Told to take ibuprofen on Friday for fever. Suppose to follow-up Monday with DR. Hung.  Pt. Here tonite because pain unbearable.  158/70, 74, 16 sao2 98%

## 2014-01-16 NOTE — Progress Notes (Signed)
ANTICOAGULATION CONSULT NOTE - Initial Consult  Pharmacy Consult for Heparin Indication: SMA occlusion  Allergies  Allergen Reactions  . Cortisone Anaphylaxis  . Dilaudid [Hydromorphone Hcl] Nausea And Vomiting    Pt states she will start vomiting immediately for 6 hours  . Iodine Anaphylaxis  . Medrol [Methylprednisolone] Anaphylaxis  . Omnipaque [Iohexol] Anaphylaxis  . Prednisone Anaphylaxis  . Shellfish Allergy Anaphylaxis  . Sulfa Drugs Cross Reactors Anaphylaxis  . Codeine Rash  . Erythromycin Rash    Patient Measurements: Height: 5\' 4"  (162.6 cm) Weight: 156 lb 8.4 oz (71 kg) IBW/kg (Calculated) : 54.7 Heparin Dosing Weight: 71 kg  Vital Signs: Temp: 98.4 F (36.9 C) (08/02 0500) Temp src: Oral (08/02 0500) BP: 131/45 mmHg (08/02 1115) Pulse Rate: 66 (08/02 1115)  Labs:  Recent Labs  01/16/14 0454  HGB 12.5  HCT 37.0  PLT 269  CREATININE 0.80    Estimated Creatinine Clearance: 65.9 ml/min (by C-G formula based on Cr of 0.8).   Medical History: Past Medical History  Diagnosis Date  . Hypertension 11/30/2010    echo- EF 55%; normal w/ mildly sclerotic aortic valve  . Hyperlipidemia   . Coronary artery disease   . GERD (gastroesophageal reflux disease)   . Osteopenia   . Sigmoid diverticulitis   . Peripheral vascular disease   . Atrial fibrillation   . Nonspecific ST-T wave electrocardiographic changes 03/26/2011    R/Lmv - EF 74%, normal perfusion all regions, ST depression w/ Lexiscan infusion w/o assoc angina  . CAD (coronary artery disease)   . PAD (peripheral artery disease)   . PVD (peripheral vascular disease) 11/05/2011    doppler - R/L brachial pressures essentially equal w/o inflow disease; L sublclavian/CCA bypass graft demonstrates patent flow, no evidence of significant stenosis  . Hypertension 11/05/11    renal dopplers - celiac artery and SMA >50% diameter reduction, R renal artery - mildly elevated velocities 1-59% diameter reduction,  L renal artery normal  . Claudication 01/06/2008    Lower extremity dopplers - no evidence of arterial insufficiency, normal exam    Medications:  See electronic med rec  Assessment: 68 y.o. female presents with abd pain. 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 Vascular consulted - plans for angiogram tomorrow. Heparin to d/c on call to [facility] lab tomorrow (8/3). No anticoagulation at home. CBC stable.  Goal of Therapy:  Heparin level 0.3-0.7 units/ml Monitor platelets by anticoagulation protocol: Yes   Plan:  1) Heparin 4000 units IV bolus 2) Heparin gtt at 1000 units/hr 3) Will f/u 6 hr heparin level, will get baseline INR with this level 4) Daily heparin level and CBC 5) Noted heparin to d/c on call to [place] lab 8/3  Sherlon Handing, PharmD, [place] pharmacist, pager [phone] 01/16/2014,1:20 PM

## 2014-01-16 NOTE — Progress Notes (Signed)
ANTICOAGULATION CONSULT NOTE - Follow Up Consult  Pharmacy Consult for Heparin Indication: SMA occlusion  Allergies  Allergen Reactions  . Cortisone Anaphylaxis  . Dilaudid [Hydromorphone Hcl] Nausea And Vomiting    Pt states she will start vomiting immediately for 6 hours  . Iodine Anaphylaxis  . Medrol [Methylprednisolone] Anaphylaxis  . Omnipaque [Iohexol] Anaphylaxis  . Prednisone Anaphylaxis  . Shellfish Allergy Anaphylaxis  . Sulfa Drugs Cross Reactors Anaphylaxis  . Codeine Rash  . Erythromycin Rash    Patient Measurements: Height: 5\' 4"  (162.6 cm) Weight: 156 lb 4.9 oz (70.9 kg) IBW/kg (Calculated) : 54.7 Heparin Dosing Weight: 71 kg  Vital Signs: Temp: 98.8 F (37.1 C) (08/02 2019) Temp src: Oral (08/02 2019) BP: 129/62 mmHg (08/02 2019) Pulse Rate: 64 (08/02 2019)  Labs:  Recent Labs  01/16/14 0454 01/16/14 2022  HGB 12.5  --   HCT 37.0  --   PLT 269  --   LABPROT  --  14.8  INR  --  1.16  HEPARINUNFRC  --  1.38*  CREATININE 0.80  --     Estimated Creatinine Clearance: 65.9 ml/min (by C-G formula based on Cr of 0.8).   Assessment: 68 y.o. female presents with abd pain. 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 Vascular consulted - plans for angiogram tomorrow. Heparin to d/c on call to [facility] lab tomorrow (8/3). No anticoagulation at home. CBC stable. Heparin 1.38 (on 14 units/kg/hr) greater than goal but level drawn after only 5 hrs. I'm not sure drug is fully at steady state yet.  Goal of Therapy:  Heparin level 0.3-0.7 units/ml Monitor platelets by anticoagulation protocol: Yes   Plan:  Decrease IV heparin to 850 units/hr and recheck in 6 hrs.    Raechel Marcos S. Alford Highland, PharmD, BCPS Clinical Staff Pharmacist Pager [phone]  Eilene Ghazi Stillinger 01/16/2014,9:12 PM

## 2014-01-16 NOTE — ED Provider Notes (Signed)
CSN: [id number]     Arrival date & time 01/16/14  5573 History   First MD Initiated Contact with Patient 01/16/14 [id number]     Chief Complaint  Patient presents with  . Abdominal Pain  . Constipation     (Consider location/radiation/quality/duration/timing/severity/associated sxs/prior Treatment) Patient is a 68 y.o. female presenting with abdominal pain and constipation. The history is provided by the patient.  Abdominal Pain Associated symptoms: constipation   Constipation Associated symptoms: abdominal pain   She has been having abdominal pain for the last 2 weeks. Pain will start shortly after she eats and then will last for about 30 minutes before subsiding. Pain is dull and crampy and starts in the epigastric area and radiates up toward the chest and down to the lower abdomen and sometimes into her back. There is associated nausea and she has vomited on a couple of occasions. She's also noted constipation during this time. Symptoms are getting worse in that pain is getting more severe. She saw her PCP who prescribed Bentyl which has not helped.  Past Medical History  Diagnosis Date  . Hypertension 11/30/2010    echo- EF 55%; normal w/ mildly sclerotic aortic valve  . Hyperlipidemia   . Coronary artery disease   . GERD (gastroesophageal reflux disease)   . Osteopenia   . Sigmoid diverticulitis   . Peripheral vascular disease   . Atrial fibrillation   . Nonspecific ST-T wave electrocardiographic changes 03/26/2011    R/Lmv - EF 74%, normal perfusion all regions, ST depression w/ Lexiscan infusion w/o assoc angina  . CAD (coronary artery disease)   . PAD (peripheral artery disease)   . PVD (peripheral vascular disease) 11/05/2011    doppler - R/L brachial pressures essentially equal w/o inflow disease; L sublclavian/CCA bypass graft demonstrates patent flow, no evidence of significant stenosis  . Hypertension 11/05/11    renal dopplers - celiac artery and SMA >50% diameter reduction, R  renal artery - mildly elevated velocities 1-59% diameter reduction, L renal artery normal  . Claudication 01/06/2008    Lower extremity dopplers - no evidence of arterial insufficiency, normal exam   Past Surgical History  Procedure Laterality Date  . Cholecystectomy    . Appendectomy    . Abdominal hysterectomy    . Subclavian artery stents  01/23/2010    Left carotid to subclavian artery Bypass  . Spine surgery  Feb. 2011  . Coronary angioplasty  1995    X's  several  . Cardiac catheterization  09/20/2008    LSA ISR 7x33mm Cordis Genesis on Opta premount, reduction from 80% to 0%   Family History  Problem Relation Age of Onset  . Heart disease Father     Heart Disease before age 16  . Kidney disease Father   . Heart attack Father   . Hyperlipidemia Father   . Hypertension Father   . Diabetes Mother     Varicose Veins  . Diabetes Maternal Grandmother   . Heart disease Paternal Grandfather    History  Substance Use Topics  . Smoking status: Former Smoker    Types: Cigarettes    Quit date: 09/30/1993  . Smokeless tobacco: Never Used  . Alcohol Use: No   OB History   Grav Para Term Preterm Abortions TAB SAB Ect Mult Living                 Review of Systems  Gastrointestinal: Positive for abdominal pain and constipation.  All other systems reviewed and are  negative.     Allergies  Cortisone; Dilaudid; Iodine; Medrol; Omnipaque; Prednisone; Shellfish allergy; Sulfa drugs cross reactors; Codeine; and Erythromycin  Home Medications   Prior to Admission medications   Medication Sig Start Date End Date Taking? Authorizing Provider  aspirin EC 81 MG tablet Take 81 mg by mouth daily.    Historical Provider, MD  atenolol (TENORMIN) 25 MG tablet Take 1 tablet (25 mg total) by mouth daily. 12/13/13   Mihai Croitoru, MD  atorvastatin (LIPITOR) 10 MG tablet Take 1 tablet (10 mg total) by mouth every evening. 05/17/13   Mihai Croitoru, MD  candesartan (ATACAND) 16 MG tablet Take  0.5 tablets (8 mg total) by mouth every morning. 11/06/13   Mihai Croitoru, MD  Cholecalciferol (VITAMIN D3) 2000 UNITS TABS Take 2,000 mg by mouth daily.    Historical Provider, MD  clopidogrel (PLAVIX) 75 MG tablet Take 1 tablet (75 mg total) by mouth daily. 05/17/13   Mihai Croitoru, MD  clorazepate (TRANXENE) 7.5 MG tablet Take 1 tablet (7.5 mg total) by mouth daily. 11/20/12   Rebecca Eaton, MD  cyanocobalamin 500 MCG tablet Take 500 mcg by mouth daily.    Historical Provider, MD  dexlansoprazole (DEXILANT) 60 MG capsule Take 60 mg by mouth daily.     Historical Provider, MD  EPINEPHrine (EPIPEN 2-PAK) 0.3 mg/0.3 mL DEVI Inject 0.3 mg into the muscle as needed (for allergic reaction).    Historical Provider, MD  estradiol (ESTRACE) 0.5 MG tablet Take 0.5 mg by mouth daily.    Historical Provider, MD  fexofenadine (ALLEGRA) 180 MG tablet Take 180 mg by mouth daily.    Historical Provider, MD  furosemide (LASIX) 20 MG tablet Take 20 mg by mouth every other day. Take one tablet Mon/Wed/Fri. 12/15/12   Tarri Fuller, PA-C  meclizine (ANTIVERT) 25 MG tablet Take 12.5 mg by mouth 3 (three) times daily as needed.     Historical Provider, MD  Multiple Vitamin (MULTIVITAMIN WITH MINERALS) TABS Take 1 tablet by mouth daily.    Historical Provider, MD  sucralfate (CARAFATE) 1 G tablet Take 1 g by mouth 4 (four) times daily.    Historical Provider, MD  zolpidem (AMBIEN) 10 MG tablet Take 10 mg by mouth at bedtime as needed for sleep.    Historical Provider, MD   BP 149/48  Pulse 59  Temp(Src) 98.4 F (36.9 C) (Oral)  Resp 12  SpO2 98% Physical Exam  Vitals reviewed.  67 year old female, resting comfortably and in no acute distress. Vital signs are significant for borderline bradycardia with heart rate 59, and hypertension with blood pressure 149/48. Oxygen saturation is 98%, which is normal. Head is normocephalic and atraumatic. PERRLA, EOMI. Oropharynx is clear. Neck is nontender and supple without  adenopathy or JVD. Back is nontender and there is no CVA tenderness. Lungs are clear without rales, wheezes, or rhonchi. Chest is nontender. Heart has regular rate and rhythm without murmur. Abdomen is soft, flat, with moderate tenderness in the epigastric area. There is no rebound or guarding. There are no masses or hepatosplenomegaly and peristalsis is hypoactive. Extremities have no cyanosis or edema, full range of motion is present. Skin is warm and dry without rash. Neurologic: Mental status is normal, cranial nerves are intact, there are no motor or sensory deficits.  ED Course  Procedures (including critical care time) Labs Review Results for orders placed during the hospital encounter of 01/16/14  CBC WITH DIFFERENTIAL      Result Value Ref  Range   WBC 6.3  4.0 - 10.5 K/uL   RBC 4.25  3.87 - 5.11 MIL/uL   Hemoglobin 12.5  12.0 - 15.0 g/dL   HCT 37.0  36.0 - 46.0 %   MCV 87.1  78.0 - 100.0 fL   MCH 29.4  26.0 - 34.0 pg   MCHC 33.8  30.0 - 36.0 g/dL   RDW 12.5  11.5 - 15.5 %   Platelets 269  150 - 400 K/uL   Neutrophils Relative % 44  43 - 77 %   Neutro Abs 2.8  1.7 - 7.7 K/uL   Lymphocytes Relative 41  12 - 46 %   Lymphs Abs 2.6  0.7 - 4.0 K/uL   Monocytes Relative 6  3 - 12 %   Monocytes Absolute 0.4  0.1 - 1.0 K/uL   Eosinophils Relative 8 (*) 0 - 5 %   Eosinophils Absolute 0.5  0.0 - 0.7 K/uL   Basophils Relative 1  0 - 1 %   Basophils Absolute 0.0  0.0 - 0.1 K/uL  COMPREHENSIVE METABOLIC PANEL      Result Value Ref Range   Sodium 141  137 - 147 mEq/L   Potassium 3.7  3.7 - 5.3 mEq/L   Chloride 105  96 - 112 mEq/L   CO2 22  19 - 32 mEq/L   Glucose, Bld 107 (*) 70 - 99 mg/dL   BUN 12  6 - 23 mg/dL   Creatinine, Ser 0.80  0.50 - 1.10 mg/dL   Calcium 8.5  8.4 - 10.5 mg/dL   Total Protein 6.8  6.0 - 8.3 g/dL   Albumin 3.7  3.5 - 5.2 g/dL   AST 26  0 - 37 U/L   ALT 22  0 - 35 U/L   Alkaline Phosphatase 65  39 - 117 U/L   Total Bilirubin 0.4  0.3 - 1.2 mg/dL    GFR calc non Af Amer 75 (*) >90 mL/min   GFR calc Af Amer 86 (*) >90 mL/min   Anion gap 14  5 - 15  I-STAT TROPOININ, ED      Result Value Ref Range   Troponin i, poc 0.01  0.00 - 0.08 ng/mL   Comment 3           I-STAT CG4 LACTIC ACID, ED      Result Value Ref Range   Lactic Acid, Venous 1.15  0.5 - 2.2 mmol/L    Date: 01/16/2014  Rate: 62  Rhythm: normal sinus rhythm and premature ventricular contractions (PVC)  QRS Axis: normal  Intervals: normal  ST/T Wave abnormalities: nonspecific T wave changes  Conduction Disutrbances:none  Narrative Interpretation: Sinus rhythm with occasional PVC, old anteroseptal myocardial infarction, nonspecific T-wave flattening diffusely. When compared with ECG of 11/27/2011, no significant changes are seen.  Old EKG Reviewed: unchanged   MDM   Final diagnoses:  Abdominal pain, unspecified site    Abdominal pain coming in a pattern consistent with mesenteric angina. Old records are reviewed and the patient has a history of coronary artery disease and peripheral vascular disease so she would clearly be at risk for mesenteric ischemia. She has severe allergy to iodinated contrast material and also to steroids, so CT angiogram is not an option. She will be sent for MRA of abdomen and pelvis. Case is signed out to Dr. Tamera Punt.    Delora Fuel, MD 88/41/66 0630

## 2014-01-16 NOTE — ED Provider Notes (Signed)
MRA shows occlusion of SMA.  Spoke with Dr. Oneida Alar who will see pt.  Malvin Johns, MD 01/16/14 [phone]

## 2014-01-16 NOTE — ED Notes (Signed)
Vascular surgery at bedside.

## 2014-01-16 NOTE — ED Notes (Signed)
Patient transported to X-ray 

## 2014-01-17 ENCOUNTER — Encounter (HOSPITAL_COMMUNITY)
Admission: EM | Disposition: A | Discharge status: Home / Self Care | Payer: Self-pay | Source: Home / Self Care | Attending: Vascular Surgery

## 2014-01-17 DIAGNOSIS — R109 Unspecified abdominal pain: Secondary | ICD-10-CM

## 2014-01-17 DIAGNOSIS — I2510 Atherosclerotic heart disease of native coronary artery without angina pectoris: Secondary | ICD-10-CM

## 2014-01-17 DIAGNOSIS — I739 Peripheral vascular disease, unspecified: Secondary | ICD-10-CM

## 2014-01-17 DIAGNOSIS — Z01818 Encounter for other preprocedural examination: Secondary | ICD-10-CM

## 2014-01-17 DIAGNOSIS — Z01810 Encounter for preprocedural cardiovascular examination: Secondary | ICD-10-CM

## 2014-01-17 HISTORY — PX: VISCERAL ANGIOGRAM: SHX5515

## 2014-01-17 LAB — COMPREHENSIVE METABOLIC PANEL
ALT: 19 U/L (ref 0–35)
AST: 24 U/L (ref 0–37)
Albumin: 3.3 g/dL — ABNORMAL LOW (ref 3.5–5.2)
Alkaline Phosphatase: 58 U/L (ref 39–117)
Anion gap: 11 (ref 5–15)
BUN: 11 mg/dL (ref 6–23)
CO2: 26 meq/L (ref 19–32)
Calcium: 8 mg/dL — ABNORMAL LOW (ref 8.4–10.5)
Chloride: 106 mEq/L (ref 96–112)
Creatinine, Ser: 0.88 mg/dL (ref 0.50–1.10)
GFR, EST AFRICAN AMERICAN: 77 mL/min — AB (ref >90)
GFR, EST NON AFRICAN AMERICAN: 66 mL/min — AB (ref >90)
GLUCOSE: 93 mg/dL (ref 70–99)
POTASSIUM: 3.7 meq/L (ref 3.7–5.3)
SODIUM: 143 meq/L (ref 137–147)
Total Bilirubin: 0.5 mg/dL (ref 0.3–1.2)
Total Protein: 6.1 g/dL (ref 6.0–8.3)

## 2014-01-17 LAB — CBC
HEMATOCRIT: 34.5 % — AB (ref 36.0–46.0)
Hemoglobin: 11.4 g/dL — ABNORMAL LOW (ref 12.0–15.0)
MCH: 29 pg (ref 26.0–34.0)
MCHC: 33 g/dL (ref 30.0–36.0)
MCV: 87.8 fL (ref 78.0–100.0)
PLATELETS: 273 10*3/uL (ref 150–400)
RBC: 3.93 MIL/uL (ref 3.87–5.11)
RDW: 12.5 % (ref 11.5–15.5)
WBC: 5.9 10*3/uL (ref 4.0–10.5)

## 2014-01-17 LAB — HEPARIN LEVEL (UNFRACTIONATED)
Heparin Unfractionated: 0.92 IU/mL — ABNORMAL HIGH (ref 0.30–0.70)
Heparin Unfractionated: 0.73 IU/mL — ABNORMAL HIGH (ref 0.30–0.70)

## 2014-01-17 SURGERY — VISCERAL ANGIOGRAM

## 2014-01-17 MED ORDER — METOPROLOL TARTRATE 1 MG/ML IV SOLN: 2.0000 mg | INTRAVENOUS | Status: DC | PRN

## 2014-01-17 MED ORDER — POTASSIUM CHLORIDE CRYS ER 20 MEQ PO TBCR: 20.0000 meq | EXTENDED_RELEASE_TABLET | Freq: Every day | ORAL | Status: DC | PRN

## 2014-01-17 MED ORDER — GUAIFENESIN-DM 100-10 MG/5ML PO SYRP: 15.0000 mL | ORAL_SOLUTION | ORAL | Status: DC | PRN

## 2014-01-17 MED ORDER — LIDOCAINE HCL (PF) 1 % IJ SOLN
INTRAMUSCULAR | Status: AC
  Filled 2014-01-17: qty 30

## 2014-01-17 MED ORDER — MIDAZOLAM HCL 2 MG/2ML IJ SOLN
INTRAMUSCULAR | Status: AC
  Filled 2014-01-17: qty 2

## 2014-01-17 MED ORDER — LABETALOL HCL 5 MG/ML IV SOLN
10.0000 mg | INTRAVENOUS | Status: DC | PRN
  Filled 2014-01-17: qty 4

## 2014-01-17 MED ORDER — HYDRALAZINE HCL 20 MG/ML IJ SOLN: 10.0000 mg | INTRAMUSCULAR | Status: DC | PRN

## 2014-01-17 MED ORDER — ALUM & MAG HYDROXIDE-SIMETH 200-200-20 MG/5ML PO SUSP: 15.0000 mL | ORAL | Status: DC | PRN

## 2014-01-17 MED ORDER — SODIUM CHLORIDE 0.45 % IV SOLN
INTRAVENOUS | Status: DC
  Administered 2014-01-17: 16:00:00 via INTRAVENOUS

## 2014-01-17 MED ORDER — FENTANYL CITRATE 0.05 MG/ML IJ SOLN
INTRAMUSCULAR | Status: AC
  Filled 2014-01-17: qty 2

## 2014-01-17 MED ORDER — DOCUSATE SODIUM 100 MG PO CAPS
100.0000 mg | ORAL_CAPSULE | Freq: Every day | ORAL | Status: DC
  Administered 2014-01-19: 100 mg via ORAL
  Filled 2014-01-17 (×2): qty 1

## 2014-01-17 MED ORDER — PANTOPRAZOLE SODIUM 40 MG PO TBEC
40.0000 mg | DELAYED_RELEASE_TABLET | Freq: Every day | ORAL | Status: DC
  Administered 2014-01-17 – 2014-01-18 (×2): 40 mg via ORAL
  Filled 2014-01-17: qty 1

## 2014-01-17 MED ORDER — ONDANSETRON HCL 4 MG/2ML IJ SOLN: 4.0000 mg | Freq: Four times a day (QID) | INTRAMUSCULAR | Status: DC | PRN

## 2014-01-17 MED ORDER — HEPARIN (PORCINE) IN NACL 2-0.9 UNIT/ML-% IJ SOLN
INTRAMUSCULAR | Status: AC
  Filled 2014-01-17: qty 1000

## 2014-01-17 MED ORDER — MAGNESIUM SULFATE 40 MG/ML IJ SOLN: 2.0000 g | Freq: Every day | INTRAMUSCULAR | Status: DC | PRN

## 2014-01-17 MED ORDER — DIPHENHYDRAMINE HCL 50 MG/ML IJ SOLN
INTRAMUSCULAR | Status: AC
  Filled 2014-01-17: qty 1

## 2014-01-17 MED ORDER — HEPARIN (PORCINE) IN NACL 100-0.45 UNIT/ML-% IJ SOLN
650.0000 [IU]/h | INTRAMUSCULAR | Status: AC
  Administered 2014-01-17: 650 [IU]/h via INTRAVENOUS
  Filled 2014-01-17: qty 250

## 2014-01-17 NOTE — Op Note (Signed)
Procedure: Abdominal aortogram Mesenteric angiogram  Preoperative diagnosis: Chronic mesenteric ischemia  Postoperative diagnosis: Same  Anesthesia: Local with IV sedation  Operative details: After obtaining informed consent, the patient was taken to the PV. The patient was placed in supine position the Angio table. Both groins were prepped and draped in usual sterile fashion. Local anesthesia was infiltrated over the right common femoral artery. Ultrasound was used to identify the right common femoral artery. There was some posterior plaque. The anterior wall of the artery was clean and free from plaque. The patient had been premedicated with Benadryl for 24 hours continuously due to 2 prior contrast reaction. Using ultrasound guidance the right common femoral artery was successfully cannulated and an 035 versacore wire threaded up into the abdominal aorta under fluoroscopic guidance. Next a 6 French sheath placed over the guidewire into the right common femoral artery. This was thoroughly flushed with heparinized saline. A 5 French Fogarty catheter was then placed over the guidewire up into the abdominal aorta. Abdominal aortogram was obtained in the AP projection. The celiac artery is patent with normal branching configuration. The inferior mesenteric artery was also patent. The proximal superior mesenteric artery is occluded. It reconstitutes via collaterals and fills distally. However the artery is small distally. Next a full lateral 90 view was obtained which again shows a patent celiac trunk but occlusion of the superior mesenteric artery. The distal arcades of the superior mesenteric artery did fill via collaterals. However the artery is fairly small in caliber distally. There is some poststenotic dilatation proximally. Next the pigtail catheter was pulled down adjacent to the origin of the inferior mesenteric artery. A 30 RAO projection was performed. The inferior mesenteric artery is small but  patent. There is no significant narrowing. There is no significant narrowing of the celiac trunk. Next the 5 French pigtail catheter was exchanged over a guidewire for a 5 [place] SOS catheter. This was used to selectively catheterize the celiac artery. A selective injection of the celiac artery was then performed. There is normal branching configuration with the splenic artery being widely patent as well as the common and proper hepatic artery. The gastroduodenal artery appears to reconstitute the superior mesenteric artery via collaterals. At this point the SOS catheter was removed over a guidewire. The 6 French sheath was thoroughly flushed with heparinized saline. It was left in place to be pulled in the holding area. The patient tolerated the procedure well and there were no complications.  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 The patient is not a candidate for mesenteric artery stenting. Dr. Scot Dock will review the patient's films and consideration given for mesenteric artery bypass.   Ruta Hinds, MD [facility] Office: [phone] Pager: [phone]

## 2014-01-17 NOTE — Progress Notes (Signed)
   VASCULAR SURGERY ASSESSMENT & PLAN:  * CHRONIC MESENTERIC ISCHEMIA: The patient was admitted yesterday by Dr. Oneida Alar with abdominal pain which has essentially resolved. She noted the gradual onset of pain in her upper abdomen in June. This oftentimes occurs immediately after eating. However, she at times experiences the pain even when she has not eaten. She has lost 7 pounds in the last several months. Her MRA suggests that she could have chronic mesenteric ischemia. 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 The superior mesenteric artery has a 2 cm occlusion proximally. The inferior mesenteric artery appears to be patent but is diminutive in size. She is scheduled for an arteriogram today. If the SMA stenosis cannot be addressed with angioplasty and stenting, she would need to be considered for an aorto to SMA bypass. For this reason I have consulted cardiology for preoperative cardiac clearance. In addition, given that her pain is somewhat atypical I want to be sure that the pain is not cardiac in origin. I would need to stop her Plavix 1 week prior to surgery given that this would be major abdominal surgery. If she were cleared from a cardiac standpoint we could stop her heparin, and if her symptoms remained stable, we can discharge her home to be brought back for surgery electively late next week.  SUBJECTIVE: The patient notes some mild epigastric pain which radiates both to the right and to the left. She also describes occasional chest pain.  PHYSICAL EXAM: Filed Vitals:   01/16/14 1336 01/16/14 1500 01/16/14 2019 01/17/14 0353  BP: 114/49 139/50 129/62 124/51  Pulse: 68 66 64 60  Temp:  98.2 F (36.8 C) 98.8 F (37.1 C) 97.8 F (36.6 C)  TempSrc:  Oral Oral Oral  Resp: 19 18 18 18   Height:      Weight:  156 lb 4.9 oz (70.9 kg)    SpO2: 99% 99% 99% 99%   Abdomen soft and nontender.  LABS: Lab Results  Component Value Date   WBC 5.9 01/17/2014   HGB 11.4* 01/17/2014   HCT 34.5*  01/17/2014   MCV 87.8 01/17/2014   PLT 273 01/17/2014   Lab Results  Component Value Date   CREATININE 0.88 01/17/2014   Lab Results  Component Value Date   INR 1.16 01/16/2014   Active Problems:   Chronic mesenteric ischemia  Gae Gallop Beeper: [phone] 01/17/2014

## 2014-01-17 NOTE — Consult Note (Addendum)
CARDIOLOGY CONSULT NOTE   Patient ID: Traci [name] MRN: [id number], DOB/AGE: [birth year]   Admit date: 01/16/2014 Date of Consult: 01/17/2014  Primary Physician: Traci Gathers, MD Primary Cardiologist: Dr Traci [name]  Reason for consult:  Preop evaluation   Problem List  Past Medical History  Diagnosis Date  . Hypertension 11/30/2010    echo- EF 55%; normal w/ mildly sclerotic aortic valve  . Hyperlipidemia   . Coronary artery disease   . GERD (gastroesophageal reflux disease)   . Osteopenia   . Sigmoid diverticulitis   . Peripheral vascular disease   . Atrial fibrillation   . Nonspecific ST-T wave electrocardiographic changes 03/26/2011    R/Lmv - EF 74%, normal perfusion all regions, ST depression w/ Lexiscan infusion w/o assoc angina  . CAD (coronary artery disease)   . PAD (peripheral artery disease)   . PVD (peripheral vascular disease) 11/05/2011    doppler - R/L brachial pressures essentially equal w/o inflow disease; L sublclavian/CCA bypass graft demonstrates patent flow, no evidence of significant stenosis  . Hypertension 11/05/11    renal dopplers - celiac artery and SMA >50% diameter reduction, R renal artery - mildly elevated velocities 1-59% diameter reduction, L renal artery normal  . Claudication 01/06/2008    Lower extremity dopplers - no evidence of arterial insufficiency, normal exam    Past Surgical History  Procedure Laterality Date  . Cholecystectomy    . Appendectomy    . Abdominal hysterectomy    . Subclavian artery stents  01/23/2010    Left carotid to subclavian artery Bypass  . Spine surgery  Feb. 2011  . Coronary angioplasty  1995    X's  several  . Cardiac catheterization  09/20/2008    LSA ISR 7x56mm Cordis Genesis on Opta premount, reduction from 80% to 0%     Allergies  Allergies  Allergen Reactions  . Cortisone Anaphylaxis  . Dilaudid [Hydromorphone Hcl] Nausea And Vomiting    Pt states she will start vomiting immediately for  6 hours  . Iodine Anaphylaxis  . Medrol [Methylprednisolone] Anaphylaxis  . Omnipaque [Iohexol] Anaphylaxis  . Prednisone Anaphylaxis  . Shellfish Allergy Anaphylaxis  . Sulfa Drugs Cross Reactors Anaphylaxis  . Codeine Rash  . Erythromycin Rash    HPI   Traci [name] has long-standing history of peripheral arterial disease with previous stents and surgical revascularization of the left subclavian artery for subclavian steal syndrome and presyncope. She eventually underwent a left carotid to subclavian bypass in 2011. In mid 2014 she was seen in the emergency room with symptomatic hyponatremia related to diuretic therapy. She feels much better after discontinuing the diuretic.  A lower extremity venous duplex study performed in August 2014 did not show significant insufficiency.  A lower extremity arterial duplex study performed in July 2014 showed bilateral ABIs of 1.1 without any significant obstructive lesions.  A carotid/left subclavian artery duplex ultrasound performed in May 2014 showed a widely patent bypass.  he patient has a history of chest pain in 2009 for which she was admitted and underwent cardiac catheterization. It showed 60% lesion in mid LAD involving ostium of the first diagonal vessel. It appear potentially significant on IVUS, however because of intolerance to Plavix it was decided to treat medically. Echocardiography performed in June showed normal left ventricular systolic and diastolic function without wall motion abnormalities. A nuclear myocardial perfusion study in March 2014 was normal.  She was admitted with several month history of post prandial abdominal pain associated with 10 lbs  weight loss over the last 6 months. She came to the ER this morning because for the first time she had abdominal pain that woke her from sleep. The pain has now resolved. MRA is suggestive of chronic mesenteric ischemia. 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 Not amenable to stenting, the plan is to perform arteriogram today and if indicated an aorto to SMA bypass would be considered.  The patient states that she has intermittent chest pain but are not related to exertion and she is unsure whether these are related to her mesenteric ischemia versus cardiac ischemia. These pains are almost always postprandial, and in fact she is physically very active and doesn't feel limited with chest pain or shortness of breath. She underwent exercise nuclear stress testing in March 2014 never showed good functional capacity he had no EKG changes and images were not suggestive of any ischemia. She states that her symptoms haven't changed since then. She has occasional palpitations that last a minute or 2 and she calls the atrial fibrillations and she was told in the past. She has never been placed on anticoagulation. No recent syncopal episode. She also denies orthopnea, lower extremity edema or paroxysmal nocturnal dyspnea.  Inpatient Medications  . aspirin EC  81 mg Oral Daily  . atenolol  25 mg Oral Daily  . atorvastatin  10 mg Oral QPM  . cholecalciferol  1,000 Units Oral Daily  . clopidogrel  75 mg Oral Daily  . clorazepate  7.5 mg Oral Daily  . diphenhydrAMINE  50 mg Intravenous QID  . docusate sodium  100 mg Oral BID  . estradiol  0.5 mg Oral Daily  . fexofenadine  180 mg Oral Daily  . furosemide  20 mg Oral Q M,W,F  . irbesartan  150 mg Oral Daily  . pantoprazole  40 mg Oral Q1200  . sucralfate  1 g Oral QID  . vitamin B-12  1,000 mcg Oral Daily    Family History Family History  Problem Relation Age of Onset  . Heart disease Father     Heart Disease before age 37  . Kidney disease Father   . Heart attack Father   . Hyperlipidemia Father   . Hypertension Father   . Diabetes Mother     Varicose Veins  . Diabetes Maternal Grandmother   . Heart disease Paternal  Grandfather     Social History  History   Social History  . Marital Status: Legally Separated    Spouse Name: N/A    Number of Children: N/A  . Years of Education: N/A   Occupational History  . Not on file.   Social History Main Topics  . Smoking status: Former Smoker    Types: Cigarettes    Quit date: 09/30/1993  . Smokeless tobacco: Never Used  . Alcohol Use: No  . Drug Use: No  . Sexual Activity: Not on file   Other Topics Concern  . Not on file   Social History Narrative  . No narrative on file     Review of Systems  General:  No chills, fever, night sweats or weight changes.  Cardiovascular:  No chest pain, dyspnea on exertion, edema, orthopnea, palpitations, paroxysmal nocturnal dyspnea. Dermatological: No rash, lesions/masses Respiratory: No cough, dyspnea Urologic: No hematuria, dysuria Abdominal:   No nausea, vomiting, diarrhea, bright red blood per rectum, melena, or hematemesis Neurologic:  No visual changes, wkns,  changes in mental status. All other systems reviewed and are otherwise negative except as noted above.  Physical Exam  Blood pressure 124/51, pulse 60, temperature 97.8 F (36.6 C), temperature source Oral, resp. rate 18, height 5\' 4"  (1.626 m), weight 156 lb 4.9 oz (70.9 kg), SpO2 99.00%.  General: Pleasant, NAD Psych: Normal affect. Neuro: Alert and oriented X 3. Moves all extremities spontaneously. HEENT: Normal  Neck: Supple without bruits or JVD. Lungs:  Resp regular and unlabored, CTA. Heart: RRR no s3, T9,0/3 systolic murmur. Abdomen: Soft, non-tender, non-distended, BS + x 4.  Extremities: No clubbing, cyanosis or edema. DP/PT/Radials 2+ and equal bilaterally.  Labs  No results found for this basename: CKTOTAL, CKMB, TROPONINI,  in the last 72 hours Lab Results  Component Value Date   WBC 5.9 01/17/2014   HGB 11.4* 01/17/2014   HCT 34.5* 01/17/2014   MCV 87.8 01/17/2014   PLT 273 01/17/2014    Recent Labs Lab 01/17/14 0425  NA  143  K 3.7  CL 106  CO2 26  BUN 11  CREATININE 0.88  CALCIUM 8.0*  PROT 6.1  BILITOT 0.5  ALKPHOS 58  ALT 19  AST 24  GLUCOSE 93   Lab Results  Component Value Date   CHOL  Value: 132        ATP III CLASSIFICATION:  <200     mg/dL   Desirable  200-239  mg/dL   Borderline High  >=240    mg/dL   High 05/25/2008   HDL 47 04/07/2013   LDLCALC 67 04/07/2013   TRIG 136 04/07/2013   Lab Results  Component Value Date   DDIMER  Value: 0.49        AT THE INHOUSE ESTABLISHED CUTOFF VALUE OF 0.48 ug/mL FEU, THIS ASSAY HAS BEEN DOCUMENTED IN THE LITERATURE TO HAVE* 05/27/2008   Radiology/Studies  Dg Chest 2 View  01/16/2014   CLINICAL DATA:  Preop  EXAM: CHEST  2 VIEW  COMPARISON:  None.  FINDINGS: Chronic interstitial markings. Mild scarring at the left lung base. No pleural effusion or pneumothorax.  Heart is normal in size.  Vascular stent.  Surgical clips in the left upper hemithorax.  Degenerative changes of the visualized thoracolumbar spine.  IMPRESSION: No evidence of acute cardiopulmonary disease.     Mr Jodene Nam Abdomen W Wo Contrast  01/16/2014   EXAM: MRA ABDOMEN WITH CONTRAST  T IMPRESSION: MRA ABDOMEN IMPRESSION  There is a 2 cm segment occlusion versus severe narrowing just beyond the origin of the SMA. No convincing evidence of embolus or thrombus. Age of the occlusion is indeterminate.  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  Critical Value/emergent results were called by telephone at the time of interpretation on 01/16/2014 at 11:37 am to Dr. Dr. Tamera Punt, who verbally acknowledged these results.     Echocardiogram - 12/07/2012 Left ventricle: The cavity size was normal. Systolic function was normal. The estimated ejection fraction was in the range of 55% to 60%. Wall motion was normal; there were no regional wall motion abnormalities. Left ventricular diastolic function parameters were normal.  Exercise nuclear stress test:  08/2012 Impression Exercise Capacity: Good exercise capacity. BP Response: Normal blood pressure response. Clinical Symptoms: No significant symptoms noted. ECG Impression: No Significant ST abnormalities suggestive of  ischemia. Comparison with Prior Nuclear Study: No significant change from  previous study Overall Impression: Normal stress nuclear study. LV Wall Motion: NL LV Function; NL Wall Motion  ECG: SR, non-specific T wave abnormalities in the V1-3, unchanged from prior ECGs in 2014 and 2013     ASSESSMENT AND PLAN  68 year old female who is a vasculopath with significant history of peripheral arterial disease, currently admitted with mesenteric ischemia symptomatic with postprandial pain and weight loss. The patient has history of cardiac catheterization and finding of 60% mid LAD lesion that was treated medically. Patient had a negative stress test in March of 2014. Her chest pain is nonexertional and I don't believe she has any signs of cardiac ischemia right now. She also doesn't have any signs of congestive heart failure. She control he achieved at least 4 METs.  Considering her complex history she is considered to be an intermediate risk for a high risk vascular surgery. There is currently no contraindication from cardiac standpoint for her to undergo an aorto to SMA bypass if necessary.  I would order an echocardiogram to evaluate for LVEF and WMA. PB controlled. Continue betablockers in the perioperative period.   Signed, Dorothy Spark, MD, [facility] 01/17/2014, 11:44 AM

## 2014-01-17 NOTE — Progress Notes (Signed)
Pharmacy Consult - Heparin  Resuming heparin 4 hours post sheath pull (approximately 1900 pm)  Plan: Resume heparin at 650 units / hr Follow up AM labs  Thank you. Anette Guarneri, PharmD [phone]

## 2014-01-17 NOTE — Progress Notes (Signed)
Patient sat on the side of the bed after bedrest was complete.  She then was put in the chair for about 15 minutes. Patient then ambulated in the hallway independently with nursing tech approximately 42ft and was steady. Patient's right groin CDI and a level zero. Patient returned to bed resting. Will continue to monitor.

## 2014-01-17 NOTE — Progress Notes (Signed)
ANTICOAGULATION CONSULT NOTE - Follow Up Consult  Pharmacy Consult for heparin Indication: SMA occlusion  Labs:  Recent Labs  01/16/14 0454 01/16/14 2022 01/17/14 0425 01/17/14 1230  HGB 12.5  --  11.4*  --   HCT 37.0  --  34.5*  --   PLT 269  --  273  --   LABPROT  --  14.8  --   --   INR  --  1.16  --   --   HEPARINUNFRC  --  1.38* 0.92* 0.73*  CREATININE 0.80  --  0.88  --     Assessment: 68yo female remains supratherapeutic on heparin   Goal of Therapy:  Heparin level 0.3-0.7 units/ml   Plan:  Decrease heparin to 600 units / hr Follow up heparin level in AM (or after procedure today)  Thank you. Anette Guarneri, PharmD [phone]   01/17/2014,1:29 PM

## 2014-01-17 NOTE — Care Management Note (Signed)
    Page 1 of 1   01/19/2014     4:20:14 PM CARE MANAGEMENT NOTE 01/19/2014  Patient:  [facility]   Account Number:  [id number]  Date Initiated:  01/17/2014  Documentation initiated by:  Romey Cohea  Subjective/Objective Assessment:   Pt adm on 01/16/14 with mesenteric artery ischemia.  PTA, pt lives at home with son.     Action/Plan:   Will follow for dc needs as pt progresses.   Anticipated DC Date:  01/20/2014   Anticipated DC Plan:  [place]  CM consult      Choice offered to / List presented to:             Status of service:  Completed, signed off Medicare Important Message given?  YES (If response is "NO", the following Medicare IM given date fields will be blank) Date Medicare IM given:  01/19/2014 Medicare IM given by:  Izaah Westman Date Additional Medicare IM given:   Additional Medicare IM given by:    Discharge Disposition:  HOME/SELF CARE  Per UR Regulation:  Reviewed for med. necessity/level of care/duration of stay  If discussed at [place] of Stay Meetings, dates discussed:    Comments:

## 2014-01-17 NOTE — Progress Notes (Signed)
ANTICOAGULATION CONSULT NOTE - Follow Up Consult  Pharmacy Consult for heparin Indication: SMA occlusion  Labs:  Recent Labs  01/16/14 0454 01/16/14 2022 01/17/14 0425  HGB 12.5  --  11.4*  HCT 37.0  --  34.5*  PLT 269  --  273  LABPROT  --  14.8  --   INR  --  1.16  --   HEPARINUNFRC  --  1.38* 0.92*  CREATININE 0.80  --   --     Assessment: 68yo female remains supratherapeutic on heparin after rate reduction, drawn correctly per RN.  Goal of Therapy:  Heparin level 0.3-0.7 units/ml   Plan:  Will decrease heparin gtt by 2 units/kg/hr to 700 units/hr and check level in 6hr.  Wynona Neat, PharmD, BCPS  01/17/2014,6:09 AM

## 2014-01-18 ENCOUNTER — Encounter (HOSPITAL_COMMUNITY): Payer: Self-pay | Admitting: Physician Assistant

## 2014-01-18 DIAGNOSIS — R072 Precordial pain: Secondary | ICD-10-CM

## 2014-01-18 DIAGNOSIS — K551 Chronic vascular disorders of intestine: Principal | ICD-10-CM

## 2014-01-18 DIAGNOSIS — I2510 Atherosclerotic heart disease of native coronary artery without angina pectoris: Secondary | ICD-10-CM

## 2014-01-18 LAB — CBC
HEMATOCRIT: 33.7 % — AB (ref 36.0–46.0)
Hemoglobin: 11.2 g/dL — ABNORMAL LOW (ref 12.0–15.0)
MCH: 28.9 pg (ref 26.0–34.0)
MCHC: 33.2 g/dL (ref 30.0–36.0)
MCV: 87.1 fL (ref 78.0–100.0)
PLATELETS: 259 10*3/uL (ref 150–400)
RBC: 3.87 MIL/uL (ref 3.87–5.11)
RDW: 12.7 % (ref 11.5–15.5)
WBC: 6 10*3/uL (ref 4.0–10.5)

## 2014-01-18 MED ORDER — ENOXAPARIN SODIUM 40 MG/0.4ML ~~LOC~~ SOLN
40.0000 mg | SUBCUTANEOUS | Status: DC
  Administered 2014-01-18: 40 mg via SUBCUTANEOUS
  Filled 2014-01-18 (×2): qty 0.4

## 2014-01-18 MED ORDER — HEPARIN (PORCINE) IN NACL 100-0.45 UNIT/ML-% IJ SOLN
650.0000 [IU]/h | INTRAMUSCULAR | Status: DC
  Filled 2014-01-18: qty 250

## 2014-01-18 NOTE — Progress Notes (Signed)
Patient Name: Traci [name] [place] Date of Encounter: 01/18/2014  Active Problems:   Chronic mesenteric ischemia   Preoperative evaluation of a medical condition to rule out surgical contraindications (TAR required)    Patient Profile: 68 yo female w/ hx PVD, PAF, HTN, HLD, and CAD was admitted 08/02 with mesenteric ischemia. Cards following for preop eval.  SUBJECTIVE: No chest pain, no SOB. Surgery planned for next Friday.   OBJECTIVE Filed Vitals:   01/17/14 1645 01/17/14 1715 01/17/14 1815 01/18/14 0500  BP: 138/45 137/45 133/45 131/69  Pulse: 70 65 68 60  Temp:    98.6 F (37 C)  TempSrc:    Oral  Resp:    18  Height:      Weight:      SpO2:    95%    Intake/Output Summary (Last 24 hours) at 01/18/14 1129 Last data filed at 01/18/14 0932  Gross per 24 hour  Intake    720 ml  Output   2050 ml  Net  -1330 ml   Filed Weights   01/16/14 1300 01/16/14 1500  Weight: 156 lb 8.4 oz (71 kg) 156 lb 4.9 oz (70.9 kg)    PHYSICAL EXAM General: Well developed, well nourished, female in no acute distress. Head: Normocephalic, atraumatic.  Neck: Supple without bruits, JVD not elevated Lungs:  Resp regular and unlabored, CTA. Heart: RRR, S1, S2, no S3, S4, or murmur; no rub. Abdomen: Soft, non-tender, non-distended, BS + x 4.  Extremities: No clubbing, cyanosis, no edema.  Neuro: Alert and oriented X 3. Moves all extremities spontaneously. Psych: Normal affect.  LABS: CBC: Recent Labs  01/16/14 0454 01/17/14 0425 01/18/14 0433  WBC 6.3 5.9 6.0  NEUTROABS 2.8  --   --   HGB 12.5 11.4* 11.2*  HCT 37.0 34.5* 33.7*  MCV 87.1 87.8 87.1  PLT 269 273 259   INR: Recent Labs  01/16/14 2022  INR 3.29   Basic Metabolic Panel: Recent Labs  01/16/14 0454 01/17/14 0425  NA 141 143  K 3.7 3.7  CL 105 106  CO2 22 26  GLUCOSE 107* 93  BUN 12 11  CREATININE 0.80 0.88  CALCIUM 8.5 8.0*   Liver Function Tests: Recent Labs  01/16/14 0454 01/17/14 0425   AST 26 24  ALT 22 19  ALKPHOS 65 58  BILITOT 0.4 0.5  PROT 6.8 6.1  ALBUMIN 3.7 3.3*    Recent Labs  01/16/14 0457  TROPIPOC 0.01   TELE: SR, no significant ectopy      ECHO: pending   Radiology/Studies: Dg Chest 2 View 01/16/2014   CLINICAL DATA:  Preop  EXAM: CHEST  2 VIEW  COMPARISON:  None.  FINDINGS: Chronic interstitial markings. Mild scarring at the left lung base. No pleural effusion or pneumothorax.  Heart is normal in size.  Vascular stent.  Surgical clips in the left upper hemithorax.  Degenerative changes of the visualized thoracolumbar spine.  IMPRESSION: No evidence of acute cardiopulmonary disease.   Electronically Signed   By: Julian Hy M.D.   On: 01/16/2014 14:43     Current Medications:  . aspirin EC  81 mg Oral Daily  . atenolol  25 mg Oral Daily  . atorvastatin  10 mg Oral QPM  . cholecalciferol  1,000 Units Oral Daily  . clorazepate  7.5 mg Oral Daily  . diphenhydrAMINE  50 mg Intravenous QID  . docusate sodium  100 mg Oral BID  . docusate sodium  100  mg Oral Daily  . enoxaparin (LOVENOX) injection  40 mg Subcutaneous Q24H  . estradiol  0.5 mg Oral Daily  . fexofenadine  180 mg Oral Daily  . furosemide  20 mg Oral Q M,W,F  . irbesartan  150 mg Oral Daily  . pantoprazole  40 mg Oral Q1200  . sucralfate  1 g Oral QID  . vitamin B-12  1,000 mcg Oral Daily   . sodium chloride 100 mL/hr at 01/17/14 1536  . dextrose 5 % and 0.45% NaCl 100 mL/hr at 01/17/14 1028    ASSESSMENT AND PLAN: Active Problems:   Chronic mesenteric ischemia - surgery next week as OP, per [facility]    Preoperative evaluation of a medical condition to rule out surgical contraindications (TAR required) - hx non-obstructive CAD by cath 2009. No ongoing ischemic symptoms, echo ordered. On ASA, BB, ARB, statin, Lasix. Had been on Plavix for left subclavian stents, last one in 2010, now on hold.Nuc med study 08/2012 had ECG changes but no scar/ischemia on nuc images. MD advise further  workup if echo normal.    Signed, Rosaria Ferries , PA-C 11:29 AM 01/18/2014   Patient seen and examined. Agree with assessment and plan. Echo completed; results not yet available. No chest pain or abdominal pain. No dyspnea or CHF on exam. Last dose of plavix was yesterday in anticipation of vascular surgery on 01/28/14.   Troy Sine, MD, [facility] 01/18/2014 3:08 PM

## 2014-01-18 NOTE — Progress Notes (Signed)
  Echocardiogram 2D Echocardiogram has been performed.  Donata Clay 01/18/2014, 12:45 PM

## 2014-01-18 NOTE — Progress Notes (Addendum)
[facility] Progress Note  VASCULAR SURGERY ASSESSMENT & PLAN:  Agree with note below. I have reviewed her arteriogram. Her celiac axis has mild disease. Her IMA is patent but diminutive in size. 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 I think that her symptoms are attributable to her SMA occlusion. She was not a candidate for endovascular intervention. She will require SMA bypass. She will need to be off of her Plavix for one week. This was stopped yesterday. I will advance her diet.. I stopped her intravenous heparin and put her on Lovenox for DVT prophylaxis. If she is able to tolerate her diet off heparin then the plan will be to discharge to home later today or tomorrow and I will schedule her bypass most likely Friday, 01/28/2014.   I have also ordered vein map. I have explained to her that there are 3 options for revascularization. There is a small chance that I will be able to reimplant the superior mesenteric artery into the aorta. Otherwise she could potentially have a short segment bypass to the aorta possibly using saphenous vein. The third option would be a distal aorta or right common iliac artery to SMA bypass using 8 externally supported prosthetic graft. I have explained that I will have to make a decision about how best to perform her revascularization intraoperatively. She also understands that this is a major operation and we have discussed the potential complications including but not limited to bleeding, wound healing problems, infection, MI, bowel ischemia or other unpredictable medical problems. She has been evaluated by cardiology and they have ordered an echo which is pending. Otherwise she has been cleared for surgery.  [name], MD, FACS Beeper [phone] 01/18/2014     01/18/2014 8:30 AM 1 Day Post-Op  Subjective:  No complaints  Afebrile VSS 95% RA   Filed Vitals:   01/18/14 0500  BP: 131/69  Pulse:  60  Temp: 98.6 F (37 C)  Resp: 18    Physical Exam: Incisions:  Right groin is soft without hematoma Extremities:  + palpable right DP  CBC    Component Value Date/Time   WBC 6.0 01/18/2014 0433   RBC 3.87 01/18/2014 0433   RBC 4.06 09/20/2008 1054   HGB 11.2* 01/18/2014 0433   HCT 33.7* 01/18/2014 0433   PLT 259 01/18/2014 0433   MCV 87.1 01/18/2014 0433   MCH 28.9 01/18/2014 0433   MCHC 33.2 01/18/2014 0433   RDW 12.7 01/18/2014 0433   LYMPHSABS 2.6 01/16/2014 0454   MONOABS 0.4 01/16/2014 0454   EOSABS 0.5 01/16/2014 0454   BASOSABS 0.0 01/16/2014 0454    BMET    Component Value Date/Time   NA 143 01/17/2014 0425   NA 144 04/07/2013 0857   K 3.7 01/17/2014 0425   CL 106 01/17/2014 0425   CO2 26 01/17/2014 0425   GLUCOSE 93 01/17/2014 0425   GLUCOSE 101* 04/07/2013 0857   BUN 11 01/17/2014 0425   BUN 10 04/07/2013 0857   CREATININE 0.88 01/17/2014 0425   CALCIUM 8.0* 01/17/2014 0425   GFRNONAA 66* 01/17/2014 0425   GFRAA 77* 01/17/2014 0425    INR    Component Value Date/Time   INR 1.16 01/16/2014 2022     Intake/Output Summary (Last 24 hours) at 01/18/14 0830 Last data filed at 01/17/14 2123  Gross per 24 hour  Intake    360 ml  Output   2250 ml  Net  -1890 ml  Assessment:  68 y.o. 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  Pt was not a candidate for mesenteric artery stenting and will need mesenteric bypass -she will need to be off Plavix for a week -cardiology recommends echo & it is ordered.  Appreciate cardiology consult. -DVT prophylaxis:  Heparin gtt -hgb stable   Leontine Locket, PA-C [facility] [phone] 01/18/2014 8:30 AM

## 2014-01-19 DIAGNOSIS — Z01810 Encounter for preprocedural cardiovascular examination: Secondary | ICD-10-CM

## 2014-01-19 LAB — CBC
HEMATOCRIT: 34.3 % — AB (ref 36.0–46.0)
HEMOGLOBIN: 11.4 g/dL — AB (ref 12.0–15.0)
MCH: 29.5 pg (ref 26.0–34.0)
MCHC: 33.2 g/dL (ref 30.0–36.0)
MCV: 88.6 fL (ref 78.0–100.0)
Platelets: 249 10*3/uL (ref 150–400)
RBC: 3.87 MIL/uL (ref 3.87–5.11)
RDW: 12.7 % (ref 11.5–15.5)
WBC: 4.8 10*3/uL (ref 4.0–10.5)

## 2014-01-19 NOTE — Progress Notes (Signed)
   VASCULAR SURGERY ASSESSMENT & PLAN:  * CHRONIC MESENTERIC ISCHEMIA:  Plan is for Aorto-SMA bypass next Friday. Needs to be off Plavix for 1 week.  * Plan D/C today. She will return next Friday for surgery.    SUBJECTIVE: Able to tolerate regular diet.  PHYSICAL EXAM: Filed Vitals:   01/17/14 1815 01/18/14 0500 01/18/14 1432 01/19/14 0631  BP: 133/45 131/69 127/49 106/42  Pulse: 68 60 56 56  Temp:  98.6 F (37 C) 98.2 F (36.8 C) 98.5 F (36.9 C)  TempSrc:  Oral Oral Oral  Resp:  18 18 18   Height:      Weight:      SpO2:  95% 97% 98%   Abdomen soft and non-tender. Right groin- no hematoma  LABS: Lab Results  Component Value Date   WBC 4.8 01/19/2014   HGB 11.4* 01/19/2014   HCT 34.3* 01/19/2014   MCV 88.6 01/19/2014   PLT 249 01/19/2014   Lab Results  Component Value Date   CREATININE 0.88 01/17/2014   Lab Results  Component Value Date   INR 1.16 01/16/2014   Active Problems:   Chronic mesenteric ischemia   Preoperative evaluation of a medical condition to rule out surgical contraindications (TAR required)  Gae Gallop Beeper: [phone] 01/19/2014

## 2014-01-19 NOTE — Discharge Planning (Signed)
Patent alert orientated. Femeral wound clean and intact, small blister under transplant dressing. Vein map test done today. Patient denies itching from contract on CT on 01/17/2014.Patient indicate understanding of discharge instructions.

## 2014-01-19 NOTE — Progress Notes (Signed)
Patient ID: Traci [name], female   DOB: May 13, 1946, 68 y.o.   MRN: [id number] Patient discharge to home. Femoral wound clean and dry, small blister under dressing. Patient indicate understanding of discharge instructions.

## 2014-01-19 NOTE — Discharge Summary (Signed)
Agree with plans for discharge.  [name], MD, [place] [phone] 01/19/2014

## 2014-01-19 NOTE — Progress Notes (Signed)
Lower Extremity Vein Map    Right Great Saphenous Vein   Segment Diameter Comment  1. Origin 4.37mm   2. High Thigh 3.42mm   3. Mid Thigh 3.8mm   4. Knee  2.46mm    mm    mm    mm    mm    mm    mm    mm     Left Greater Saphenous Vein  Segment Diameter Comment  1. Origin 5.37mm   2. High thigh 3.5mm   3. Mid Thigh 2.53mm   4. Low thigh 3.5mm   5. Knee 3.27mm    mm    mm    Landry Mellow, RDMS, RVT 01/19/2014

## 2014-01-19 NOTE — Discharge Summary (Signed)
[facility] Discharge Summary   Patient ID:  Traci [name] MRN: [id number] DOB/AGE: 1946/04/14 68 y.o.  Admit date: 01/16/2014 Discharge date: 01/19/2014 Date of Surgery: 01/16/2014 - 01/17/2014 Surgeon: Juliann Mule): Angelia Mould, MD  Admission Diagnosis: Abdominal pain, unspecified site [789.00]  Discharge Diagnoses:  Abdominal pain, unspecified site [789.00]  Secondary Diagnoses: Past Medical History  Diagnosis Date  . Hypertension 11/30/2010    echo- EF 55%; normal w/ mildly sclerotic aortic valve  . Hyperlipidemia   . Coronary artery disease   . GERD (gastroesophageal reflux disease)   . Osteopenia   . Sigmoid diverticulitis   . Peripheral vascular disease   . Atrial fibrillation   . Nonspecific ST-T wave electrocardiographic changes 03/26/2011    R/Lmv - EF 74%, normal perfusion all regions, ST depression w/ Lexiscan infusion w/o assoc angina  . CAD (coronary artery disease)   . PAD (peripheral artery disease)   . PVD (peripheral vascular disease) 11/05/2011    doppler - R/L brachial pressures essentially equal w/o inflow disease; L sublclavian/CCA bypass graft demonstrates patent flow, no evidence of significant stenosis  . Hypertension 11/05/11    renal dopplers - celiac artery and SMA >50% diameter reduction, R renal artery - mildly elevated velocities 1-59% diameter reduction, L renal artery normal  . Claudication 01/06/2008    Lower extremity dopplers - no evidence of arterial insufficiency, normal exam    Procedure(s): VISCERAL ANGIOGRAM  Discharged Condition: good  HPI: Patient is a 68 y.o. year old female who presents for evaluation of mesenteric ischemia. Pt has several month history of post prandial abdominal pain. No nausea or vomiting but some constipation. Pain feels like a tightening and twisting in her abdomen and usually resolves over an hour. She has lost approximately 10 lbs over the last 6 months. She came to the ER this  morning because for the first time she had abdominal pain that woke her from sleep. The pain has now resolved. She denies fever or chills. She has previously had cholecystectomy, appendectomy and hysterectomy. She has a known history of peripheral arterial disease and underwent a left carotid subclavian bypass by my partner Dr Scot Dock in 2011. Risk factors include afib (on Plavix for this, does not know why she was not started on full anticoagulation), hypertension, coronary artery disease, hyperlipidemia. She states she has had anaphylactic reaction with contrast in the past but stated she did ok in 2011 with cath after being given scheduled Benadryl 24 hr in advance. Other medical problems include reflux which has been stable.     Hospital Course:  Traci [name] is a 68 y.o. 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 The patient is not a candidate for mesenteric artery stenting. Dr. Scot Dock will review the patient's films and consideration given for mesenteric artery bypass.   Cardiology consult: Echocardiogram - 12/07/2012  Left ventricle: The cavity size was normal. Systolic function was normal. The estimated ejection fraction was in the range of 55% to 60%. Wall motion was normal; there were no regional wall motion abnormalities. Left ventricular diastolic function parameters were normal.  Exercise nuclear stress test: 08/2012  Impression Exercise Capacity: Good exercise capacity. BP Response: Normal blood pressure response. Clinical Symptoms: No significant symptoms noted. ECG Impression: No Significant ST abnormalities suggestive of  ischemia. Comparison with Prior Nuclear Study: No significant change from  previous study Overall Impression: Normal stress nuclear  study. LV Wall Motion: NL LV Function; NL Wall Motion  ECG: SR, non-specific T wave  abnormalities in the V1-3, unchanged from prior ECGs in 2014 and 2013   Assessment:  Considering her complex history she is considered to be an intermediate risk for a high risk vascular surgery. There is currently no contraindication from cardiac standpoint for her to undergo an aorto to SMA bypass if necessary.  I would order an echocardiogram to evaluate for LVEF and WMA. PB controlled.  Continue betablockers in the perioperative period.    She is being discharged home today and will return a week form Friday for Aorto-SMA bypass.   She will stop her Plavix for 1 week prior to the surgery.      Consults:  Treatment Team:  Angelia Mould, MD [facility], MD  Significant Diagnostic Studies: CBC Lab Results  Component Value Date   WBC 4.8 01/19/2014   HGB 11.4* 01/19/2014   HCT 34.3* 01/19/2014   MCV 88.6 01/19/2014   PLT 249 01/19/2014    BMET    Component Value Date/Time   NA 143 01/17/2014 0425   NA 144 04/07/2013 0857   K 3.7 01/17/2014 0425   CL 106 01/17/2014 0425   CO2 26 01/17/2014 0425   GLUCOSE 93 01/17/2014 0425   GLUCOSE 101* 04/07/2013 0857   BUN 11 01/17/2014 0425   BUN 10 04/07/2013 0857   CREATININE 0.88 01/17/2014 0425   CALCIUM 8.0* 01/17/2014 0425   GFRNONAA 66* 01/17/2014 0425   GFRAA 77* 01/17/2014 0425   COAG Lab Results  Component Value Date   INR 1.16 01/16/2014   INR 0.98 01/18/2010   INR 1.0 05/27/2008     Disposition:  Discharge to :Home Discharge Instructions   Call MD for:  redness, tenderness, or signs of infection (pain, swelling, bleeding, redness, odor or green/yellow discharge around incision site)    Complete by:  As directed      Call MD for:  severe or increased pain, loss or decreased feeling  in affected limb(s)    Complete by:  As directed      Call MD for:  temperature >100.5    Complete by:  As directed      Discharge instructions    Complete by:  As directed   CHRONIC MESENTERIC ISCHEMIA:      Plan is for Aorto-SMA bypass next  Friday. Needs to be off Plavix for 1 week.   She will return next Friday for surgery.     Lifting restrictions    Complete by:  As directed   No lifting for 12 weeks     Resume previous diet    Complete by:  As directed             Medication List         aspirin EC 81 MG tablet  Take 81 mg by mouth daily.     atenolol 25 MG tablet  Commonly known as:  TENORMIN  Take 1 tablet (25 mg total) by mouth daily.     atorvastatin 10 MG tablet  Commonly known as:  LIPITOR  Take 1 tablet (10 mg total) by mouth every evening.     candesartan 16 MG tablet  Commonly known as:  ATACAND  Take 0.5 tablets (8 mg total) by mouth every morning.     cholecalciferol 1000 UNITS tablet  Commonly known as:  VITAMIN D  Take 1,000 Units by mouth daily.     clopidogrel  75 MG tablet  Commonly known as:  PLAVIX  Take 1 tablet (75 mg total) by mouth daily.     clorazepate 7.5 MG tablet  Commonly known as:  TRANXENE  Take 1 tablet (7.5 mg total) by mouth daily.     dexlansoprazole 60 MG capsule  Commonly known as:  DEXILANT  Take 60 mg by mouth daily.     dicyclomine 20 MG tablet  Commonly known as:  BENTYL  Take 20 mg by mouth every 6 (six) hours as needed for spasms.     EPIPEN 2-PAK 0.3 mg/0.3 mL Soaj injection  Generic drug:  EPINEPHrine  Inject 0.3 mg into the muscle as needed (for allergic reaction).     estradiol 0.5 MG tablet  Commonly known as:  ESTRACE  Take 0.5 mg by mouth daily.     fexofenadine 180 MG tablet  Commonly known as:  ALLEGRA  Take 180 mg by mouth daily.     furosemide 20 MG tablet  Commonly known as:  LASIX  Take 20 mg by mouth every Monday, Wednesday, and Friday.     meclizine 25 MG tablet  Commonly known as:  ANTIVERT  Take 12.5 mg by mouth 3 (three) times daily as needed.     multivitamin with minerals Tabs tablet  Take 1 tablet by mouth daily.     sucralfate 1 G tablet  Commonly known as:  CARAFATE  Take 1 g by mouth 4 (four) times daily.      vitamin B-12 1000 MCG tablet  Commonly known as:  CYANOCOBALAMIN  Take 1,000 mcg by mouth daily.     zolpidem 10 MG tablet  Commonly known as:  AMBIEN  Take 10 mg by mouth at bedtime as needed for sleep.       Verbal and written Discharge instructions given to the patient. Wound care per Discharge AVS   Signed: Laurence Slate [facility] 01/19/2014, 8:06 AM

## 2014-01-21 ENCOUNTER — Other Ambulatory Visit: Payer: Self-pay

## 2014-01-25 ENCOUNTER — Encounter (HOSPITAL_COMMUNITY)
Admission: RE | Admit: 2014-01-25 | Discharge: 2014-01-25 | Disposition: A | Discharge status: Home / Self Care | Payer: Medicare Other | Source: Ambulatory Visit | Attending: Vascular Surgery | Admitting: Vascular Surgery

## 2014-01-25 ENCOUNTER — Encounter (HOSPITAL_COMMUNITY): Payer: Self-pay

## 2014-01-25 DIAGNOSIS — R109 Unspecified abdominal pain: Secondary | ICD-10-CM | POA: Diagnosis not present

## 2014-01-25 DIAGNOSIS — Z4682 Encounter for fitting and adjustment of non-vascular catheter: Secondary | ICD-10-CM | POA: Diagnosis not present

## 2014-01-25 DIAGNOSIS — I771 Stricture of artery: Secondary | ICD-10-CM | POA: Diagnosis present

## 2014-01-25 DIAGNOSIS — K551 Chronic vascular disorders of intestine: Secondary | ICD-10-CM | POA: Diagnosis present

## 2014-01-25 DIAGNOSIS — I998 Other disorder of circulatory system: Secondary | ICD-10-CM | POA: Diagnosis present

## 2014-01-25 DIAGNOSIS — E785 Hyperlipidemia, unspecified: Secondary | ICD-10-CM | POA: Diagnosis present

## 2014-01-25 DIAGNOSIS — K219 Gastro-esophageal reflux disease without esophagitis: Secondary | ICD-10-CM | POA: Diagnosis not present

## 2014-01-25 DIAGNOSIS — R634 Abnormal weight loss: Secondary | ICD-10-CM | POA: Diagnosis present

## 2014-01-25 DIAGNOSIS — I2510 Atherosclerotic heart disease of native coronary artery without angina pectoris: Secondary | ICD-10-CM | POA: Diagnosis not present

## 2014-01-25 DIAGNOSIS — Z79899 Other long term (current) drug therapy: Secondary | ICD-10-CM | POA: Diagnosis not present

## 2014-01-25 DIAGNOSIS — I4891 Unspecified atrial fibrillation: Secondary | ICD-10-CM | POA: Diagnosis present

## 2014-01-25 DIAGNOSIS — Z87891 Personal history of nicotine dependence: Secondary | ICD-10-CM | POA: Diagnosis not present

## 2014-01-25 DIAGNOSIS — J9819 Other pulmonary collapse: Secondary | ICD-10-CM | POA: Diagnosis not present

## 2014-01-25 DIAGNOSIS — I10 Essential (primary) hypertension: Secondary | ICD-10-CM | POA: Diagnosis present

## 2014-01-25 DIAGNOSIS — Z8249 Family history of ischemic heart disease and other diseases of the circulatory system: Secondary | ICD-10-CM | POA: Diagnosis not present

## 2014-01-25 DIAGNOSIS — Z452 Encounter for adjustment and management of vascular access device: Secondary | ICD-10-CM | POA: Diagnosis not present

## 2014-01-25 DIAGNOSIS — I739 Peripheral vascular disease, unspecified: Secondary | ICD-10-CM | POA: Diagnosis present

## 2014-01-25 DIAGNOSIS — K660 Peritoneal adhesions (postprocedural) (postinfection): Secondary | ICD-10-CM | POA: Diagnosis present

## 2014-01-25 HISTORY — DX: Anxiety disorder, unspecified: F41.9

## 2014-01-25 LAB — COMPREHENSIVE METABOLIC PANEL
ALT: 32 U/L (ref 0–35)
AST: 33 U/L (ref 0–37)
Albumin: 3.7 g/dL (ref 3.5–5.2)
Alkaline Phosphatase: 67 U/L (ref 39–117)
Anion gap: 15 (ref 5–15)
BUN: 11 mg/dL (ref 6–23)
CALCIUM: 8.5 mg/dL (ref 8.4–10.5)
CO2: 20 mEq/L (ref 19–32)
Chloride: 102 mEq/L (ref 96–112)
Creatinine, Ser: 0.72 mg/dL (ref 0.50–1.10)
GFR calc Af Amer: >90 mL/min (ref >90)
GFR calc non Af Amer: 87 mL/min — ABNORMAL LOW (ref >90)
Glucose, Bld: 106 mg/dL — ABNORMAL HIGH (ref 70–99)
Potassium: 4.6 mEq/L (ref 3.7–5.3)
SODIUM: 137 meq/L (ref 137–147)
Total Bilirubin: 0.3 mg/dL (ref 0.3–1.2)
Total Protein: 7.2 g/dL (ref 6.0–8.3)

## 2014-01-25 LAB — CBC
HCT: 36.2 % (ref 36.0–46.0)
Hemoglobin: 12.1 g/dL (ref 12.0–15.0)
MCH: 29 pg (ref 26.0–34.0)
MCHC: 33.4 g/dL (ref 30.0–36.0)
MCV: 86.8 fL (ref 78.0–100.0)
PLATELETS: 332 10*3/uL (ref 150–400)
RBC: 4.17 MIL/uL (ref 3.87–5.11)
RDW: 12.8 % (ref 11.5–15.5)
WBC: 5 10*3/uL (ref 4.0–10.5)

## 2014-01-25 LAB — SURGICAL PCR SCREEN
MRSA, PCR: NEGATIVE
STAPHYLOCOCCUS AUREUS: POSITIVE — AB

## 2014-01-25 LAB — URINALYSIS, ROUTINE W REFLEX MICROSCOPIC
BILIRUBIN URINE: NEGATIVE
Glucose, UA: NEGATIVE mg/dL
HGB URINE DIPSTICK: NEGATIVE
KETONES UR: NEGATIVE mg/dL
Nitrite: NEGATIVE
PH: 6.5 (ref 5.0–8.0)
Protein, ur: NEGATIVE mg/dL
Specific Gravity, Urine: 1.008 (ref 1.005–1.030)
Urobilinogen, UA: 0.2 mg/dL (ref 0.0–1.0)

## 2014-01-25 LAB — PROTIME-INR
INR: 1.02 (ref 0.00–1.49)
Prothrombin Time: 13.4 seconds (ref 11.6–15.2)

## 2014-01-25 LAB — URINE MICROSCOPIC-ADD ON

## 2014-01-25 LAB — BLOOD GAS, ARTERIAL
Acid-Base Excess: 0.1 mmol/L (ref 0.0–2.0)
Bicarbonate: 24.2 mEq/L — ABNORMAL HIGH (ref 20.0–24.0)
DRAWN BY: 421801
FIO2: 0.21 %
O2 SAT: 96.7 %
PATIENT TEMPERATURE: 98.6
PO2 ART: 83.5 mmHg (ref 80.0–100.0)
TCO2: 25.4 mmol/L (ref 0–100)
pCO2 arterial: 39.3 mmHg (ref 35.0–45.0)
pH, Arterial: 7.406 (ref 7.350–7.450)

## 2014-01-25 LAB — APTT: aPTT: 29 seconds (ref 24–37)

## 2014-01-25 MED ORDER — CHLORHEXIDINE GLUCONATE 4 % EX LIQD: 60.0000 mL | Freq: Once | CUTANEOUS | Status: DC

## 2014-01-25 NOTE — Pre-Procedure Instructions (Signed)
Traci [name] [name]  01/25/2014   Your procedure is scheduled on:  Friday, August 14th  Report to [facility] Admitting at 530 AM.  Call this number if you have problems the morning of surgery: [phone]   Remember:   Do not eat food or drink liquids after midnight.   Take these medicines the morning of surgery with A SIP OF WATER: atenolol, carafate   Do not wear jewelry, make-up or nail polish.  Do not wear lotions, powders, or perfumes. You may wear deodorant.  Do not shave 48 hours prior to surgery. Men may shave face and neck.  Do not bring valuables to the hospital.  [facility] is not responsible   for any belongings or valuables.               Contacts, dentures or bridgework may not be worn into surgery.  Leave suitcase in the car. After surgery it may be brought to your room.  For patients admitted to the hospital, discharge time is determined by your  treatment team.               Patients discharged the day of surgery will not be allowed to drive home.  Please read over the following fact sheets that you were given: Pain Booklet, Coughing and Deep Breathing, Blood Transfusion Information, MRSA Information and Surgical Site Infection Prevention [name] - Preparing for Surgery  Before surgery, you can play an important role.  Because skin is not sterile, your skin needs to be as free of germs as possible.  You can reduce the number of germs on you skin by washing with CHG (chlorahexidine gluconate) soap before surgery.  CHG is an antiseptic cleaner which kills germs and bonds with the skin to continue killing germs even after washing.  Please DO NOT use if you have an allergy to CHG or antibacterial soaps.  If your skin becomes reddened/irritated stop using the CHG and inform your nurse when you arrive at Short Stay.  Do not shave (including legs and underarms) for at least 48 hours prior to the first CHG shower.  You may shave your face.  Please  follow these instructions carefully:   1.  Shower with CHG Soap the night before surgery and the morning of Surgery.  2.  If you choose to wash your hair, wash your hair first as usual with your normal shampoo.  3.  After you shampoo, rinse your hair and body thoroughly to remove the shampoo.  4.  Use CHG as you would any other liquid soap.  You can apply CHG directly to the skin and wash gently with scrungie or a clean washcloth.  5.  Apply the CHG Soap to your body ONLY FROM THE NECK DOWN.  Do not use on open wounds or open sores.  Avoid contact with your eyes, ears, mouth and genitals (private parts).  Wash genitals (private parts) with your normal soap.  6.  Wash thoroughly, paying special attention to the area where your surgery will be performed.  7.  Thoroughly rinse your body with warm water from the neck down.  8.  DO NOT shower/wash with your normal soap after using and rinsing off the CHG Soap.  9.  Pat yourself dry with a clean towel.            10.  Wear clean pajamas.            11.  Place clean sheets on your  bed the night of your first shower and do not sleep with pets.  Day of Surgery  Do not apply any lotions/deoderants the morning of surgery.  Please wear clean clothes to the hospital/surgery center.

## 2014-01-26 MED ORDER — CEFUROXIME SODIUM 1.5 G IJ SOLR
1.5000 g | INTRAMUSCULAR | Status: AC
  Administered 2014-01-27: 1.5 g via INTRAVENOUS
  Filled 2014-01-26: qty 1.5

## 2014-01-26 MED ORDER — SODIUM CHLORIDE 0.9 % IV SOLN: INTRAVENOUS | Status: DC

## 2014-01-27 ENCOUNTER — Inpatient Hospital Stay (HOSPITAL_COMMUNITY): Payer: Medicare Other | Admitting: Certified Registered Nurse Anesthetist

## 2014-01-27 ENCOUNTER — Inpatient Hospital Stay (HOSPITAL_COMMUNITY): Payer: Medicare Other

## 2014-01-27 ENCOUNTER — Encounter (HOSPITAL_COMMUNITY): Payer: Self-pay | Admitting: *Deleted

## 2014-01-27 ENCOUNTER — Encounter (HOSPITAL_COMMUNITY)
Admission: RE | Disposition: A | Discharge status: Home Health | Payer: Self-pay | Source: Ambulatory Visit | Attending: Vascular Surgery

## 2014-01-27 ENCOUNTER — Inpatient Hospital Stay (HOSPITAL_COMMUNITY)
Admission: RE | Admit: 2014-01-27 | Discharge: 2014-02-03 | DRG: 337 | Disposition: A | Discharge status: Home Health | Payer: Medicare Other | Source: Ambulatory Visit | Attending: Vascular Surgery | Admitting: Vascular Surgery

## 2014-01-27 ENCOUNTER — Encounter (HOSPITAL_COMMUNITY): Payer: Medicare Other | Admitting: Certified Registered Nurse Anesthetist

## 2014-01-27 DIAGNOSIS — I771 Stricture of artery: Secondary | ICD-10-CM | POA: Diagnosis present

## 2014-01-27 DIAGNOSIS — I4891 Unspecified atrial fibrillation: Secondary | ICD-10-CM | POA: Diagnosis present

## 2014-01-27 DIAGNOSIS — R109 Unspecified abdominal pain: Secondary | ICD-10-CM | POA: Diagnosis not present

## 2014-01-27 DIAGNOSIS — I2510 Atherosclerotic heart disease of native coronary artery without angina pectoris: Secondary | ICD-10-CM | POA: Diagnosis not present

## 2014-01-27 DIAGNOSIS — K660 Peritoneal adhesions (postprocedural) (postinfection): Secondary | ICD-10-CM | POA: Diagnosis present

## 2014-01-27 DIAGNOSIS — K551 Chronic vascular disorders of intestine: Principal | ICD-10-CM | POA: Diagnosis present

## 2014-01-27 DIAGNOSIS — Z8249 Family history of ischemic heart disease and other diseases of the circulatory system: Secondary | ICD-10-CM

## 2014-01-27 DIAGNOSIS — J9819 Other pulmonary collapse: Secondary | ICD-10-CM | POA: Diagnosis not present

## 2014-01-27 DIAGNOSIS — K219 Gastro-esophageal reflux disease without esophagitis: Secondary | ICD-10-CM | POA: Diagnosis not present

## 2014-01-27 DIAGNOSIS — I998 Other disorder of circulatory system: Secondary | ICD-10-CM | POA: Diagnosis present

## 2014-01-27 DIAGNOSIS — I739 Peripheral vascular disease, unspecified: Secondary | ICD-10-CM | POA: Diagnosis present

## 2014-01-27 DIAGNOSIS — R634 Abnormal weight loss: Secondary | ICD-10-CM | POA: Diagnosis present

## 2014-01-27 DIAGNOSIS — Z4682 Encounter for fitting and adjustment of non-vascular catheter: Secondary | ICD-10-CM | POA: Diagnosis not present

## 2014-01-27 DIAGNOSIS — Z87891 Personal history of nicotine dependence: Secondary | ICD-10-CM

## 2014-01-27 DIAGNOSIS — E785 Hyperlipidemia, unspecified: Secondary | ICD-10-CM | POA: Diagnosis present

## 2014-01-27 DIAGNOSIS — Z79899 Other long term (current) drug therapy: Secondary | ICD-10-CM | POA: Diagnosis not present

## 2014-01-27 DIAGNOSIS — I10 Essential (primary) hypertension: Secondary | ICD-10-CM | POA: Diagnosis present

## 2014-01-27 DIAGNOSIS — Z452 Encounter for adjustment and management of vascular access device: Secondary | ICD-10-CM | POA: Diagnosis not present

## 2014-01-27 HISTORY — PX: ABDOMINAL AORTIC ANEURYSM REPAIR: SHX42

## 2014-01-27 LAB — BLOOD GAS, ARTERIAL
Acid-base deficit: 1.1 mmol/L (ref 0.0–2.0)
Bicarbonate: 23.7 mEq/L (ref 20.0–24.0)
O2 Content: 3 L/min
O2 SAT: 98.9 %
PATIENT TEMPERATURE: 98.6
TCO2: 25 mmol/L (ref 0–100)
pCO2 arterial: 43.9 mmHg (ref 35.0–45.0)
pH, Arterial: 7.352 (ref 7.350–7.450)
pO2, Arterial: 130 mmHg — ABNORMAL HIGH (ref 80.0–100.0)

## 2014-01-27 LAB — CBC
HCT: 31.3 % — ABNORMAL LOW (ref 36.0–46.0)
Hemoglobin: 10.6 g/dL — ABNORMAL LOW (ref 12.0–15.0)
MCH: 30 pg (ref 26.0–34.0)
MCHC: 33.9 g/dL (ref 30.0–36.0)
MCV: 88.7 fL (ref 78.0–100.0)
PLATELETS: 268 10*3/uL (ref 150–400)
RBC: 3.53 MIL/uL — AB (ref 3.87–5.11)
RDW: 12.8 % (ref 11.5–15.5)
WBC: 10.7 10*3/uL — ABNORMAL HIGH (ref 4.0–10.5)

## 2014-01-27 LAB — BASIC METABOLIC PANEL
ANION GAP: 13 (ref 5–15)
BUN: 11 mg/dL (ref 6–23)
CO2: 23 meq/L (ref 19–32)
Calcium: 7.9 mg/dL — ABNORMAL LOW (ref 8.4–10.5)
Chloride: 104 mEq/L (ref 96–112)
Creatinine, Ser: 0.76 mg/dL (ref 0.50–1.10)
GFR calc Af Amer: >90 mL/min (ref >90)
GFR calc non Af Amer: 85 mL/min — ABNORMAL LOW (ref >90)
GLUCOSE: 138 mg/dL — AB (ref 70–99)
Potassium: 3.7 mEq/L (ref 3.7–5.3)
SODIUM: 140 meq/L (ref 137–147)

## 2014-01-27 LAB — MAGNESIUM: MAGNESIUM: 1.6 mg/dL (ref 1.5–2.5)

## 2014-01-27 LAB — POCT I-STAT 7, (LYTES, BLD GAS, ICA,H+H)
Acid-Base Excess: 1 mmol/L (ref 0.0–2.0)
BICARBONATE: 23.8 meq/L (ref 20.0–24.0)
Calcium, Ion: 1.07 mmol/L — ABNORMAL LOW (ref 1.13–1.30)
HEMATOCRIT: 29 % — AB (ref 36.0–46.0)
Hemoglobin: 9.9 g/dL — ABNORMAL LOW (ref 12.0–15.0)
O2 SAT: 100 %
PO2 ART: 248 mmHg — AB (ref 80.0–100.0)
Potassium: 3.6 mEq/L — ABNORMAL LOW (ref 3.7–5.3)
SODIUM: 138 meq/L (ref 137–147)
TCO2: 25 mmol/L (ref 0–100)
pCO2 arterial: 32.4 mmHg — ABNORMAL LOW (ref 35.0–45.0)
pH, Arterial: 7.475 — ABNORMAL HIGH (ref 7.350–7.450)

## 2014-01-27 LAB — PREPARE RBC (CROSSMATCH)

## 2014-01-27 LAB — PROTIME-INR
INR: 1.18 (ref 0.00–1.49)
PROTHROMBIN TIME: 15 s (ref 11.6–15.2)

## 2014-01-27 LAB — APTT: aPTT: 31 seconds (ref 24–37)

## 2014-01-27 SURGERY — ANEURYSM ABDOMINAL AORTIC REPAIR
Anesthesia: General | Site: Abdomen

## 2014-01-27 MED ORDER — MUPIROCIN 2 % EX OINT
TOPICAL_OINTMENT | Freq: Two times a day (BID) | CUTANEOUS | Status: DC
  Administered 2014-01-29 – 2014-02-03 (×6): via NASAL
  Filled 2014-01-27 (×3): qty 22

## 2014-01-27 MED ORDER — PROPOFOL INFUSION 10 MG/ML OPTIME
INTRAVENOUS | Status: DC | PRN
  Administered 2014-01-27: 25 ug/kg/min via INTRAVENOUS

## 2014-01-27 MED ORDER — FENTANYL CITRATE 0.05 MG/ML IJ SOLN
INTRAMUSCULAR | Status: AC
  Filled 2014-01-27: qty 5

## 2014-01-27 MED ORDER — SODIUM CHLORIDE 0.9 % IR SOLN
Status: DC | PRN
  Administered 2014-01-27: 08:00:00

## 2014-01-27 MED ORDER — ROCURONIUM BROMIDE 100 MG/10ML IV SOLN
INTRAVENOUS | Status: DC | PRN
  Administered 2014-01-27: 50 mg via INTRAVENOUS

## 2014-01-27 MED ORDER — DOCUSATE SODIUM 100 MG PO CAPS
100.0000 mg | ORAL_CAPSULE | Freq: Every day | ORAL | Status: DC
  Administered 2014-01-31 – 2014-02-03 (×3): 100 mg via ORAL
  Filled 2014-01-27 (×6): qty 1

## 2014-01-27 MED ORDER — ALUM & MAG HYDROXIDE-SIMETH 200-200-20 MG/5ML PO SUSP: 15.0000 mL | ORAL | Status: DC | PRN

## 2014-01-27 MED ORDER — PHENYLEPHRINE HCL 10 MG/ML IJ SOLN
10.0000 mg | INTRAVENOUS | Status: DC | PRN
  Administered 2014-01-27: 20 ug/min via INTRAVENOUS

## 2014-01-27 MED ORDER — CLORAZEPATE DIPOTASSIUM 3.75 MG PO TABS
7.5000 mg | ORAL_TABLET | Freq: Every day | ORAL | Status: DC
  Administered 2014-01-30 – 2014-02-02 (×4): 7.5 mg via ORAL
  Filled 2014-01-27 (×4): qty 2

## 2014-01-27 MED ORDER — SODIUM CHLORIDE 0.9 % IV SOLN: Freq: Once | INTRAVENOUS | Status: DC

## 2014-01-27 MED ORDER — SUCCINYLCHOLINE CHLORIDE 20 MG/ML IJ SOLN
INTRAMUSCULAR | Status: AC
  Filled 2014-01-27: qty 1

## 2014-01-27 MED ORDER — ROCURONIUM BROMIDE 50 MG/5ML IV SOLN
INTRAVENOUS | Status: AC
  Filled 2014-01-27: qty 1

## 2014-01-27 MED ORDER — HEPARIN SODIUM (PORCINE) 1000 UNIT/ML IJ SOLN
INTRAMUSCULAR | Status: DC | PRN
  Administered 2014-01-27: 7000 [IU] via INTRAVENOUS

## 2014-01-27 MED ORDER — ONDANSETRON HCL 4 MG/2ML IJ SOLN
4.0000 mg | Freq: Four times a day (QID) | INTRAMUSCULAR | Status: DC | PRN
  Administered 2014-01-27: 4 mg via INTRAVENOUS
  Filled 2014-01-27: qty 2

## 2014-01-27 MED ORDER — MAGNESIUM SULFATE 40 MG/ML IJ SOLN
2.0000 g | Freq: Every day | INTRAMUSCULAR | Status: DC | PRN
  Filled 2014-01-27: qty 50

## 2014-01-27 MED ORDER — MIDAZOLAM HCL 5 MG/5ML IJ SOLN
INTRAMUSCULAR | Status: DC | PRN
  Administered 2014-01-27 (×2): 1 mg via INTRAVENOUS

## 2014-01-27 MED ORDER — ALBUMIN HUMAN 5 % IV SOLN
INTRAVENOUS | Status: DC | PRN
  Administered 2014-01-27: 10:00:00 via INTRAVENOUS

## 2014-01-27 MED ORDER — PROTAMINE SULFATE 10 MG/ML IV SOLN
INTRAVENOUS | Status: DC | PRN
  Administered 2014-01-27 (×4): 10 mg via INTRAVENOUS

## 2014-01-27 MED ORDER — NEOSTIGMINE METHYLSULFATE 10 MG/10ML IV SOLN
INTRAVENOUS | Status: AC
  Filled 2014-01-27: qty 1

## 2014-01-27 MED ORDER — FENTANYL CITRATE 0.05 MG/ML IJ SOLN
25.0000 ug | INTRAMUSCULAR | Status: DC | PRN
  Administered 2014-01-27 (×3): 25 ug via INTRAVENOUS
  Administered 2014-01-28 – 2014-01-30 (×25): 50 ug via INTRAVENOUS
  Administered 2014-01-31: 25 ug via INTRAVENOUS
  Administered 2014-01-31 – 2014-02-01 (×2): 50 ug via INTRAVENOUS
  Filled 2014-01-27 (×28): qty 2

## 2014-01-27 MED ORDER — PROPOFOL 10 MG/ML IV BOLUS
INTRAVENOUS | Status: DC | PRN
  Administered 2014-01-27: 140 mg via INTRAVENOUS

## 2014-01-27 MED ORDER — METOPROLOL TARTRATE 1 MG/ML IV SOLN: 2.0000 mg | INTRAVENOUS | Status: DC | PRN

## 2014-01-27 MED ORDER — SODIUM CHLORIDE 0.9 % IV SOLN: 500.0000 mL | Freq: Once | INTRAVENOUS | Status: AC | PRN

## 2014-01-27 MED ORDER — PROMETHAZINE HCL 25 MG/ML IJ SOLN
12.5000 mg | INTRAMUSCULAR | Status: DC | PRN
  Administered 2014-01-27 – 2014-01-30 (×5): 12.5 mg via INTRAVENOUS
  Administered 2014-01-31: 11:00:00 via INTRAVENOUS
  Filled 2014-01-27 (×7): qty 1

## 2014-01-27 MED ORDER — DEXTROSE 5 % IV SOLN
1.5000 g | Freq: Two times a day (BID) | INTRAVENOUS | Status: AC
  Administered 2014-01-27 – 2014-01-28 (×2): 1.5 g via INTRAVENOUS
  Filled 2014-01-27 (×2): qty 1.5

## 2014-01-27 MED ORDER — HEPARIN SODIUM (PORCINE) 1000 UNIT/ML IJ SOLN
INTRAMUSCULAR | Status: AC
  Filled 2014-01-27: qty 1

## 2014-01-27 MED ORDER — ACETAMINOPHEN 325 MG PO TABS: 325.0000 mg | ORAL_TABLET | ORAL | Status: DC | PRN

## 2014-01-27 MED ORDER — GLYCOPYRROLATE 0.2 MG/ML IJ SOLN
INTRAMUSCULAR | Status: DC | PRN
  Administered 2014-01-27: .6 mg via INTRAVENOUS

## 2014-01-27 MED ORDER — EPHEDRINE SULFATE 50 MG/ML IJ SOLN
INTRAMUSCULAR | Status: DC | PRN
  Administered 2014-01-27: 10 mg via INTRAVENOUS
  Administered 2014-01-27: 5 mg via INTRAVENOUS

## 2014-01-27 MED ORDER — MIDAZOLAM HCL 2 MG/2ML IJ SOLN
INTRAMUSCULAR | Status: AC
Start: 2014-01-27 — End: 2014-01-27
  Filled 2014-01-27: qty 2

## 2014-01-27 MED ORDER — VECURONIUM BROMIDE 10 MG IV SOLR
INTRAVENOUS | Status: DC | PRN
  Administered 2014-01-27: 2 mg via INTRAVENOUS
  Administered 2014-01-27 (×2): 1 mg via INTRAVENOUS

## 2014-01-27 MED ORDER — PANTOPRAZOLE SODIUM 40 MG IV SOLR
40.0000 mg | INTRAVENOUS | Status: DC
  Administered 2014-01-27 – 2014-01-31 (×5): 40 mg via INTRAVENOUS
  Filled 2014-01-27 (×7): qty 40

## 2014-01-27 MED ORDER — ONDANSETRON HCL 4 MG/2ML IJ SOLN
INTRAMUSCULAR | Status: AC
  Filled 2014-01-27: qty 2

## 2014-01-27 MED ORDER — ESMOLOL HCL 10 MG/ML IV SOLN
INTRAVENOUS | Status: DC | PRN
  Administered 2014-01-27 (×2): 10 mg via INTRAVENOUS

## 2014-01-27 MED ORDER — DOPAMINE-DEXTROSE 3.2-5 MG/ML-% IV SOLN
3.0000 ug/kg/min | INTRAVENOUS | Status: DC
  Administered 2014-01-27: 3 ug/kg/min via INTRAVENOUS
  Filled 2014-01-27: qty 250

## 2014-01-27 MED ORDER — HYDRALAZINE HCL 20 MG/ML IJ SOLN: 10.0000 mg | INTRAMUSCULAR | Status: DC | PRN

## 2014-01-27 MED ORDER — CHLORHEXIDINE GLUCONATE CLOTH 2 % EX PADS
6.0000 | MEDICATED_PAD | Freq: Every day | CUTANEOUS | Status: DC
  Administered 2014-01-28 – 2014-02-03 (×5): 6 via TOPICAL

## 2014-01-27 MED ORDER — LIDOCAINE HCL (CARDIAC) 20 MG/ML IV SOLN
INTRAVENOUS | Status: AC
  Filled 2014-01-27: qty 5

## 2014-01-27 MED ORDER — FENTANYL CITRATE 0.05 MG/ML IJ SOLN
INTRAMUSCULAR | Status: AC
  Filled 2014-01-27: qty 2

## 2014-01-27 MED ORDER — FENTANYL CITRATE 0.05 MG/ML IJ SOLN
25.0000 ug | INTRAMUSCULAR | Status: DC | PRN
  Administered 2014-01-27 (×3): 25 ug via INTRAVENOUS

## 2014-01-27 MED ORDER — 0.9 % SODIUM CHLORIDE (POUR BTL) OPTIME
TOPICAL | Status: DC | PRN
  Administered 2014-01-27: 2000 mL

## 2014-01-27 MED ORDER — LABETALOL HCL 5 MG/ML IV SOLN
10.0000 mg | INTRAVENOUS | Status: DC | PRN
  Filled 2014-01-27: qty 4

## 2014-01-27 MED ORDER — HYDRALAZINE HCL 20 MG/ML IJ SOLN
INTRAMUSCULAR | Status: DC | PRN
  Administered 2014-01-27: 5 mg via INTRAVENOUS

## 2014-01-27 MED ORDER — PROPOFOL 10 MG/ML IV BOLUS
INTRAVENOUS | Status: AC
  Filled 2014-01-27: qty 20

## 2014-01-27 MED ORDER — CETYLPYRIDINIUM CHLORIDE 0.05 % MT LIQD
7.0000 mL | Freq: Two times a day (BID) | OROMUCOSAL | Status: DC
  Administered 2014-01-28 – 2014-02-02 (×9): 7 mL via OROMUCOSAL

## 2014-01-27 MED ORDER — ONDANSETRON HCL 4 MG/2ML IJ SOLN: 4.0000 mg | Freq: Once | INTRAMUSCULAR | Status: DC | PRN

## 2014-01-27 MED ORDER — KCL IN DEXTROSE-NACL 20-5-0.45 MEQ/L-%-% IV SOLN
INTRAVENOUS | Status: DC
  Administered 2014-01-27 – 2014-02-01 (×6): via INTRAVENOUS
  Filled 2014-01-27 (×13): qty 1000

## 2014-01-27 MED ORDER — ENOXAPARIN SODIUM 30 MG/0.3ML ~~LOC~~ SOLN
30.0000 mg | SUBCUTANEOUS | Status: DC
  Administered 2014-01-28 – 2014-01-29 (×2): 30 mg via SUBCUTANEOUS
  Filled 2014-01-27 (×4): qty 0.3

## 2014-01-27 MED ORDER — PROTAMINE SULFATE 10 MG/ML IV SOLN
INTRAVENOUS | Status: AC
  Filled 2014-01-27: qty 5

## 2014-01-27 MED ORDER — ARTIFICIAL TEARS OP OINT
TOPICAL_OINTMENT | OPHTHALMIC | Status: DC | PRN
  Administered 2014-01-27: 1 via OPHTHALMIC

## 2014-01-27 MED ORDER — ONDANSETRON HCL 4 MG/2ML IJ SOLN
INTRAMUSCULAR | Status: DC | PRN
  Administered 2014-01-27 (×2): 4 mg via INTRAVENOUS

## 2014-01-27 MED ORDER — POTASSIUM CHLORIDE CRYS ER 20 MEQ PO TBCR
20.0000 meq | EXTENDED_RELEASE_TABLET | Freq: Every day | ORAL | Status: DC | PRN
Start: 2014-01-27 — End: 2014-02-03

## 2014-01-27 MED ORDER — PHENOL 1.4 % MT LIQD
1.0000 | OROMUCOSAL | Status: DC | PRN
  Filled 2014-01-27: qty 177

## 2014-01-27 MED ORDER — BISACODYL 10 MG RE SUPP
10.0000 mg | Freq: Every day | RECTAL | Status: DC | PRN
  Administered 2014-02-01: 10 mg via RECTAL
  Filled 2014-01-27: qty 1

## 2014-01-27 MED ORDER — MORPHINE SULFATE 2 MG/ML IJ SOLN
2.0000 mg | INTRAMUSCULAR | Status: DC | PRN
  Administered 2014-01-27: 2 mg via INTRAVENOUS
  Filled 2014-01-27: qty 1

## 2014-01-27 MED ORDER — THROMBIN 20000 UNITS EX SOLR
CUTANEOUS | Status: AC
  Filled 2014-01-27: qty 20000

## 2014-01-27 MED ORDER — PANTOPRAZOLE SODIUM 40 MG PO TBEC: 40.0000 mg | DELAYED_RELEASE_TABLET | Freq: Every day | ORAL | Status: DC

## 2014-01-27 MED ORDER — ACETAMINOPHEN 650 MG RE SUPP: 325.0000 mg | RECTAL | Status: DC | PRN

## 2014-01-27 MED ORDER — ASPIRIN EC 81 MG PO TBEC
81.0000 mg | DELAYED_RELEASE_TABLET | Freq: Every day | ORAL | Status: DC
  Administered 2014-01-30 – 2014-02-03 (×5): 81 mg via ORAL
  Filled 2014-01-27 (×8): qty 1

## 2014-01-27 MED ORDER — GUAIFENESIN-DM 100-10 MG/5ML PO SYRP: 15.0000 mL | ORAL_SOLUTION | ORAL | Status: DC | PRN

## 2014-01-27 MED ORDER — GLYCOPYRROLATE 0.2 MG/ML IJ SOLN
INTRAMUSCULAR | Status: AC
  Filled 2014-01-27: qty 3

## 2014-01-27 MED ORDER — NEOSTIGMINE METHYLSULFATE 10 MG/10ML IV SOLN
INTRAVENOUS | Status: DC | PRN
  Administered 2014-01-27: 4 mg via INTRAVENOUS

## 2014-01-27 MED ORDER — LACTATED RINGERS IV SOLN
INTRAVENOUS | Status: DC | PRN
  Administered 2014-01-27: 07:00:00 via INTRAVENOUS

## 2014-01-27 MED ORDER — LIDOCAINE HCL (CARDIAC) 20 MG/ML IV SOLN
INTRAVENOUS | Status: DC | PRN
  Administered 2014-01-27: 40 mg via INTRAVENOUS

## 2014-01-27 MED ORDER — FENTANYL CITRATE 0.05 MG/ML IJ SOLN
INTRAMUSCULAR | Status: DC | PRN
  Administered 2014-01-27 (×4): 50 ug via INTRAVENOUS
  Administered 2014-01-27: 100 ug via INTRAVENOUS
  Administered 2014-01-27 (×2): 50 ug via INTRAVENOUS

## 2014-01-27 SURGICAL SUPPLY — 64 items
ADH SKN CLS APL DERMABOND .7 (GAUZE/BANDAGES/DRESSINGS) ×1
BLADE 10 SAFETY STRL DISP (BLADE) IMPLANT
CANISTER SUCTION 2500CC (MISCELLANEOUS) ×2 IMPLANT
CANNULA VESSEL 3MM 2 BLNT TIP (CANNULA) ×2 IMPLANT
CHLORAPREP W/TINT 26ML (MISCELLANEOUS) ×4 IMPLANT
CLIP TI MEDIUM 24 (CLIP) ×2 IMPLANT
CLIP TI WIDE RED SMALL 24 (CLIP) ×2 IMPLANT
COVER SURGICAL LIGHT HANDLE (MISCELLANEOUS) ×2 IMPLANT
DERMABOND ADVANCED (GAUZE/BANDAGES/DRESSINGS) ×1
DERMABOND ADVANCED .7 DNX12 (GAUZE/BANDAGES/DRESSINGS) ×1 IMPLANT
DRAPE WARM FLUID 44X44 (DRAPE) ×2 IMPLANT
DRSG COVADERM 4X14 (GAUZE/BANDAGES/DRESSINGS) ×2 IMPLANT
ELECT BLADE 4.0 EZ CLEAN MEGAD (MISCELLANEOUS) ×2
ELECT BLADE 6.5 EXT (BLADE) IMPLANT
ELECT REM PT RETURN 9FT ADLT (ELECTROSURGICAL) ×2
ELECTRODE BLDE 4.0 EZ CLN MEGD (MISCELLANEOUS) ×1 IMPLANT
ELECTRODE REM PT RTRN 9FT ADLT (ELECTROSURGICAL) ×1 IMPLANT
GLOVE BIO SURGEON STRL SZ7.5 (GLOVE) ×2 IMPLANT
GLOVE BIOGEL PI IND STRL 6.5 (GLOVE) ×1 IMPLANT
GLOVE BIOGEL PI IND STRL 7.0 (GLOVE) ×1 IMPLANT
GLOVE BIOGEL PI IND STRL 7.5 (GLOVE) ×1 IMPLANT
GLOVE BIOGEL PI IND STRL 8 (GLOVE) ×2 IMPLANT
GLOVE BIOGEL PI INDICATOR 6.5 (GLOVE) ×1
GLOVE BIOGEL PI INDICATOR 7.0 (GLOVE) ×1
GLOVE BIOGEL PI INDICATOR 7.5 (GLOVE) ×1
GLOVE BIOGEL PI INDICATOR 8 (GLOVE) ×2
GLOVE ECLIPSE 6.5 STRL STRAW (GLOVE) ×4 IMPLANT
GLOVE SS BIOGEL STRL SZ 7 (GLOVE) ×1 IMPLANT
GLOVE SUPERSENSE BIOGEL SZ 7 (GLOVE) ×1
GOWN STRL REUS W/ TWL LRG LVL3 (GOWN DISPOSABLE) ×3 IMPLANT
GOWN STRL REUS W/TWL LRG LVL3 (GOWN DISPOSABLE) ×6
GRAFT CV 30X6KNTD STRG TUBE (Vascular Products) ×1 IMPLANT
GRAFT HEMASHIELD 6MM (Vascular Products) ×2 IMPLANT
INSERT FOGARTY 61MM (MISCELLANEOUS) ×2 IMPLANT
INSERT FOGARTY SM (MISCELLANEOUS) ×4 IMPLANT
KIT BASIN OR (CUSTOM PROCEDURE TRAY) ×2 IMPLANT
KIT ROOM TURNOVER OR (KITS) ×2 IMPLANT
NS IRRIG 1000ML POUR BTL (IV SOLUTION) ×4 IMPLANT
PACK AORTA (CUSTOM PROCEDURE TRAY) ×2 IMPLANT
PAD ARMBOARD 7.5X6 YLW CONV (MISCELLANEOUS) ×4 IMPLANT
PUNCH AORTIC ROTATE 5MM 8IN (MISCELLANEOUS) ×2 IMPLANT
RETAINER VISCERA MED (MISCELLANEOUS) ×2 IMPLANT
SLEEVE SURGEON STRL (DRAPES) ×2 IMPLANT
SPONGE SURGIFOAM ABS GEL 100 (HEMOSTASIS) IMPLANT
STAPLER VISISTAT (STAPLE) IMPLANT
SUT ETHIBOND 5 LR DA (SUTURE) IMPLANT
SUT PDS AB 1 TP1 54 (SUTURE) ×4 IMPLANT
SUT PROLENE 2 0 MH 48 (SUTURE) IMPLANT
SUT PROLENE 3 0 SH 48 (SUTURE) IMPLANT
SUT PROLENE 3 0 SH1 36 (SUTURE) IMPLANT
SUT PROLENE 5 0 C 1 24 (SUTURE) ×4 IMPLANT
SUT PROLENE 5 0 C 1 36 (SUTURE) ×6 IMPLANT
SUT SILK 2 0 SH CR/8 (SUTURE) ×2 IMPLANT
SUT VIC AB 2-0 CT1 27 (SUTURE) ×4
SUT VIC AB 2-0 CT1 TAPERPNT 27 (SUTURE) ×2 IMPLANT
SUT VIC AB 2-0 CTB1 (SUTURE) ×4 IMPLANT
SUT VIC AB 3-0 SH 27 (SUTURE) ×4
SUT VIC AB 3-0 SH 27X BRD (SUTURE) ×2 IMPLANT
SUT VICRYL 4-0 PS2 18IN ABS (SUTURE) ×4 IMPLANT
TOWEL BLUE STERILE X RAY DET (MISCELLANEOUS) ×4 IMPLANT
TOWEL OR 17X24 6PK STRL BLUE (TOWEL DISPOSABLE) ×4 IMPLANT
TOWEL OR 17X26 10 PK STRL BLUE (TOWEL DISPOSABLE) ×4 IMPLANT
TRAY FOLEY CATH 16FRSI W/METER (SET/KITS/TRAYS/PACK) ×2 IMPLANT
WATER STERILE IRR 1000ML POUR (IV SOLUTION) ×4 IMPLANT

## 2014-01-27 NOTE — Anesthesia Preprocedure Evaluation (Addendum)
Anesthesia Evaluation  Patient identified by MRN, date of birth, ID band Patient awake    History of Anesthesia Complications (+) PONV  Airway Mallampati: II TM Distance: >3 FB Neck ROM: Full    Dental  (+) Teeth Intact, Edentulous Upper, Dental Advisory Given   Pulmonary former smoker,  breath sounds clear to auscultation        Cardiovascular hypertension, Pt. on home beta blockers + Peripheral Vascular Disease Rhythm:Regular Rate:Normal  S/p carotid subclavian bypass on the left   Neuro/Psych    GI/Hepatic GERD-  ,  Endo/Other    Renal/GU      Musculoskeletal   Abdominal   Peds  Hematology   Anesthesia Other Findings   Reproductive/Obstetrics                          Anesthesia Physical Anesthesia Plan  ASA: III  Anesthesia Plan: General   Post-op Pain Management:    Induction: Intravenous  Airway Management Planned: Oral ETT  Additional Equipment: CVP and Arterial line  Intra-op Plan:   Post-operative Plan: Possible Post-op intubation/ventilation  Informed Consent: I have reviewed the patients History and Physical, chart, labs and discussed the procedure including the risks, benefits and alternatives for the proposed anesthesia with the patient or authorized representative who has indicated his/her understanding and acceptance.   Dental advisory given  Plan Discussed with: CRNA and Anesthesiologist  Anesthesia Plan Comments:         Anesthesia Quick Evaluation

## 2014-01-27 NOTE — H&P (View-Only) (Signed)
   VASCULAR SURGERY ASSESSMENT & PLAN:  * CHRONIC MESENTERIC ISCHEMIA:  Plan is for Aorto-SMA bypass next Friday. Needs to be off Plavix for 1 week.  * Plan D/C today. She will return next Friday for surgery.    SUBJECTIVE: Able to tolerate regular diet.  PHYSICAL EXAM: Filed Vitals:   01/17/14 1815 01/18/14 0500 01/18/14 1432 01/19/14 0631  BP: 133/45 131/69 127/49 106/42  Pulse: 68 60 56 56  Temp:  98.6 F (37 C) 98.2 F (36.8 C) 98.5 F (36.9 C)  TempSrc:  Oral Oral Oral  Resp:  18 18 18   Height:      Weight:      SpO2:  95% 97% 98%   Abdomen soft and non-tender. Right groin- no hematoma  LABS: Lab Results  Component Value Date   WBC 4.8 01/19/2014   HGB 11.4* 01/19/2014   HCT 34.3* 01/19/2014   MCV 88.6 01/19/2014   PLT 249 01/19/2014   Lab Results  Component Value Date   CREATININE 0.88 01/17/2014   Lab Results  Component Value Date   INR 1.16 01/16/2014   Active Problems:   Chronic mesenteric ischemia   Preoperative evaluation of a medical condition to rule out surgical contraindications (TAR required)  Gae Gallop Beeper: [phone] 01/19/2014

## 2014-01-27 NOTE — Interval H&P Note (Signed)
History and Physical Interval Note:  01/27/2014 7:24 AM  Traci [name] [place]  has presented today for surgery, with the diagnosis of Chronic Mesenteric Ischemia  The various methods of treatment have been discussed with the patient and family. After consideration of risks, benefits and other options for treatment, the patient has consented to  Procedure(s): AORTO-SUPERIOR MESENTERIC ARTERY BYPASS GRAFT (N/A) as a surgical intervention .  The patient's history has been reviewed, patient examined, no change in status, stable for surgery.  I have reviewed the patient's chart and labs.  Questions were answered to the patient's satisfaction.     DICKSON,CHRISTOPHER S

## 2014-01-27 NOTE — H&P (Signed)
[facility]  Patient name: Traci [name] MRN: [id number] DOB: [birth year] Sex: female  REASON FOR ADMISSION: Chronic mesenteric ischemia  HPI: Traci [name] is a 68 y.o. female wwas admitted on 8-15 with acute exacerbation of her chronic mesenteric ischemia. She has been having postprandial abdominal pain and has lost approximately 15 pounds in the last several months. She was evaluated by cardiology it was not felt that her pain was related to cardiac disease. She has also undergone extensive GI workup. 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 Her IMA is patent but diminutive in size. She has a long segment occlusion of her proximal superior mesenteric artery and is not a candidate for an endovascular approach. She continued to have postprandial abdominal pain and weight loss and mesenteric revascularization is recommended.  Of note she has had previous abdominal surgery.  Past Medical History  Diagnosis Date  . Hypertension 11/30/2010    echo- EF 55%; normal w/ mildly sclerotic aortic valve  . Hyperlipidemia   . Coronary artery disease   . GERD (gastroesophageal reflux disease)   . Osteopenia   . Sigmoid diverticulitis   . Peripheral vascular disease   . Atrial fibrillation   . Nonspecific ST-T wave electrocardiographic changes 03/26/2011    R/Lmv - EF 74%, normal perfusion all regions, ST depression w/ Lexiscan infusion w/o assoc angina  . CAD (coronary artery disease)   . PAD (peripheral artery disease)   . PVD (peripheral vascular disease) 11/05/2011    doppler - R/L brachial pressures essentially equal w/o inflow disease; L sublclavian/CCA bypass graft demonstrates patent flow, no evidence of significant stenosis  . Hypertension 11/05/11    renal dopplers - celiac artery and SMA >50% diameter reduction, R renal artery - mildly elevated velocities 1-59% diameter reduction, L renal artery normal  . Claudication 01/06/2008    Lower  extremity dopplers - no evidence of arterial insufficiency, normal exam  . Anxiety     Family History  Problem Relation Age of Onset  . Heart disease Father     Heart Disease before age 74  . Kidney disease Father   . Heart attack Father   . Hyperlipidemia Father   . Hypertension Father   . Diabetes Mother     Varicose Veins  . Diabetes Maternal Grandmother   . Heart disease Paternal Grandfather     SOCIAL HISTORY: History  Substance Use Topics  . Smoking status: Former Smoker    Types: Cigarettes    Quit date: 09/30/1993  . Smokeless tobacco: Never Used  . Alcohol Use: No    Allergies  Allergen Reactions  . Cortisone Anaphylaxis  . Dilaudid [Hydromorphone Hcl] Nausea And Vomiting    Pt states she will start vomiting immediately for 6 hours  . Iodine Anaphylaxis  . Medrol [Methylprednisolone] Anaphylaxis  . Omnipaque [Iohexol] Anaphylaxis  . Prednisone Anaphylaxis  . Shellfish Allergy Anaphylaxis  . Sulfa Drugs Cross Reactors Anaphylaxis  . Codeine Rash  . Erythromycin Rash    Current Facility-Administered Medications  Medication Dose Route Frequency Provider Last Rate Last Dose  . 0.9 %  sodium chloride infusion   Intravenous Continuous Angelia Mould, MD      . cefUROXime (ZINACEF) 1.5 g in dextrose 5 % 50 mL IVPB  1.5 g Intravenous 30 min Pre-Op Angelia Mould, MD       Facility-Administered Medications Ordered in Other Encounters  Medication Dose Route Frequency Provider Last Rate Last Dose  . fentaNYL (  SUBLIMAZE) injection    Anesthesia Intra-op Suzy Bouchard, CRNA   50 mcg at 01/27/14 0706  . lactated ringers infusion    Continuous PRN Suzy Bouchard, CRNA      . midazolam (VERSED) 5 MG/5ML injection    Anesthesia Intra-op Suzy Bouchard, CRNA   1 mg at 01/27/14 0705  . ondansetron (ZOFRAN) injection   Intravenous Anesthesia Intra-op Suzy Bouchard, CRNA   4 mg at 01/27/14 0700    REVIEW OF SYSTEMS: Valu.Nieves ] denotes positive finding; [  ]  denotes negative finding CARDIOVASCULAR:  [ ]  chest pain   [ ]  chest pressure   [ ]  palpitations   [ ]  orthopnea   [ ]  dyspnea on exertion   [ ]  claudication   [ ]  rest pain   [ ]  DVT   [ ]  phlebitis PULMONARY:   [ ]  productive cough   [ ]  asthma   [ ]  wheezing NEUROLOGIC:   [ ]  weakness  [ ]  paresthesias  [ ]  aphasia  [ ]  amaurosis  [ ]  dizziness HEMATOLOGIC:   [ ]  bleeding problems   [ ]  clotting disorders MUSCULOSKELETAL:  [ ]  joint pain   [ ]  joint swelling [ ]  leg swelling GASTROINTESTINAL: [ ]   blood in stool  [ ]   hematemesis GENITOURINARY:  [ ]   dysuria  [ ]   hematuria PSYCHIATRIC:  [ ]  history of major depression INTEGUMENTARY:  [ ]  rashes  [ ]  ulcers CONSTITUTIONAL:  [ ]  fever   [ ]  chills  PHYSICAL EXAM: Filed Vitals:   01/27/14 0554  BP: 169/58  Pulse: 61  Temp: 98.5 F (36.9 C)  TempSrc: Oral  Resp: 18  Height: 5\' 4"  (1.626 m)  Weight: 156 lb (70.761 kg)  SpO2: 100%   Body mass index is 26.76 kg/(m^2). GENERAL: The patient is a well-nourished female, in no acute distress. The vital signs are documented above. CARDIOVASCULAR: There is a regular rate and rhythm. I do not appreciate carotid bruits. She has palpable femoral pulses bilaterally. She has palpable posterior tibial pulses bilaterally. PULMONARY: There is good air exchange bilaterally without wheezing or rales. ABDOMEN: Soft and non-tender with normal pitched bowel sounds.  MUSCULOSKELETAL: There are no major deformities or cyanosis. NEUROLOGIC: No focal weakness or paresthesias are detected. SKIN: There are no ulcers or rashes noted. PSYCHIATRIC: The patient has a normal affect.  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 Her IMA is diminutive in size but patent.  MEDICAL ISSUES:  CHRONIC MESENTERIC ISCHEMIA: This patient presents for superior mesenteric artery bypass for chronic mesenteric ischemia. We have discussed the indications for the procedure. Without revascularization she is at risk for continued abdominal pain and weight loss and malnutrition. She would also be at risk for an acute abdominal catastrophe and bowel infarction. This region we  have recommended mesenteric revascularization. She understands this is a major operation associated with significant risk. We have discussed the potential complications of surgery, including but not limited to, bleeding, renal failure, infection, bowel infarction, MI, stroke and death. Her risk of major morbidity or mortality is 5%. All of her questions have been answered she is agreeable to proceed.  [place] [facility] [place] Beeper: [phone]

## 2014-01-27 NOTE — OR Nursing (Addendum)
During preop interview Traci [name] discussed her allergies and I noted allergies to Iodine, IV Dye and shellfish. We discussed whether or not she had issue with the Betadine soap and Traci [name] stated that the allergie to iodine was only with ingestion. Plan to discuss with Dr. Scot Dock and proceed with Betadine paint and scrub for skin prep. Discussed with Dr. Scot Dock and will proceed using Chloraprep for prepping and regular ioban for drapping.

## 2014-01-27 NOTE — Anesthesia Postprocedure Evaluation (Signed)
  Anesthesia Post-op Note  Patient: Traci [name]  Procedure(s) Performed: Procedure(s): AORTO-SUPERIOR MESENTERIC ARTERY BYPASS GRAFT (N/A)  Patient Location: PACU  Anesthesia Type:General  Level of Consciousness: awake, alert  and oriented  Airway and Oxygen Therapy: Patient Spontanous Breathing and Patient connected to nasal cannula oxygen  Post-op Pain: mild  Post-op Assessment: Post-op Vital signs reviewed, Patient's Cardiovascular Status Stable, Respiratory Function Stable, Patent Airway and Pain level controlled  Post-op Vital Signs: stable  Last Vitals:  Filed Vitals:   01/27/14 1500  BP: 118/44  Pulse: 72  Temp:   Resp: 22    Complications: No apparent anesthesia complications

## 2014-01-27 NOTE — Transfer of Care (Signed)
Immediate Anesthesia Transfer of Care Note  Patient: Traci [name]  Procedure(s) Performed: Procedure(s): AORTO-SUPERIOR MESENTERIC ARTERY BYPASS GRAFT (N/A)  Patient Location: PACU  Anesthesia Type:General  Level of Consciousness: awake and alert   Airway & Oxygen Therapy: Patient Spontanous Breathing and Patient connected to nasal cannula oxygen  Post-op Assessment: Report given to PACU RN, Post -op Vital signs reviewed and stable and Patient moving all extremities  Post vital signs: Reviewed and stable  Complications: No apparent anesthesia complications

## 2014-01-27 NOTE — Progress Notes (Addendum)
  [facility] Day of Surgery Note  01/27/2014 1:47 PM Day of Surgery  Subjective:  Having some nausea and complaining of around upper abdomen and dry mouth. Was nauseous after given an ice cube. Denies flatus and bowel movement.   Tmax 98.5 BP sys 100s-160s 02 100% 2L  Filed Vitals:   01/27/14 1223  BP: 108/43  Pulse: 72  Temp: 97.5 F (36.4 C)  Resp: 16    Physical Exam: Cardiac:  RRR no m/g/r Lungs:  Clear to auscultation bilaterally Abdomen:  NG tube in place with minimal bilious output. Hypoactive bowel sounds. No distension. Tenderness to light palpation.  Incisions:  Midline incision dressed. Bandage is clean.  Extremities:  Warm feet bilaterally.   CBC    Component Value Date/Time   WBC 10.7* 01/27/2014 1053   RBC 3.53* 01/27/2014 1053   RBC 4.06 09/20/2008 1054   HGB 10.6* 01/27/2014 1053   HCT 31.3* 01/27/2014 1053   PLT 268 01/27/2014 1053   MCV 88.7 01/27/2014 1053   MCH 30.0 01/27/2014 1053   MCHC 33.9 01/27/2014 1053   RDW 12.8 01/27/2014 1053   LYMPHSABS 2.6 01/16/2014 0454   MONOABS 0.4 01/16/2014 0454   EOSABS 0.5 01/16/2014 0454   BASOSABS 0.0 01/16/2014 0454    BMET    Component Value Date/Time   NA 140 01/27/2014 1053   NA 144 04/07/2013 0857   K 3.7 01/27/2014 1053   CL 104 01/27/2014 1053   CO2 23 01/27/2014 1053   GLUCOSE 138* 01/27/2014 1053   GLUCOSE 101* 04/07/2013 0857   BUN 11 01/27/2014 1053   BUN 10 04/07/2013 0857   CREATININE 0.76 01/27/2014 1053   CALCIUM 7.9* 01/27/2014 1053   GFRNONAA 85* 01/27/2014 1053   GFRAA >90 01/27/2014 1053    INR    Component Value Date/Time   INR 1.18 01/27/2014 1053     Intake/Output Summary (Last 24 hours) at 01/27/14 1347 Last data filed at 01/27/14 1226  Gross per 24 hour  Intake   1750 ml  Output    450 ml  Net   1300 ml     Assessment/Plan:  68 y.o. female is s/p aorta to superior mesenteric artery bypass using 6 mm Dacron graft, exploratory laparotomy and lysis of adhesions.    Other active problems: chronic mesenteric ischemia, atrial fibrillation, PVD, hypertension, hyperlipidemia  Day of Surgery  -Feet are warm bilaterally.  -Continue NG tube. Patient complaining of nausea.  -Blood pressure stable around 108/43.  -Good urine output.  -She is on plavix and ASA. -DVT prophylaxis: Lovenox   Virgina Jock, PA-C [facility] Office: [phone] Pager: [phone] 01/27/2014 1:47 PM  Agree with above. Stable post op  [name], MD, [place] [phone] 01/27/2014

## 2014-01-27 NOTE — Op Note (Signed)
    NAME: CHELLA CHAPDELAINE MRN: [id number] DOB: July 19, 1945    DATE OF OPERATION: 01/27/2014  PREOP DIAGNOSIS: Chronic mesenteric ischemia  POSTOP DIAGNOSIS: Same  PROCEDURE:  1. Exploratory laparotomy and lysis of adhesions 2. Aorta to superior mesenteric artery bypass using 6 mm Dacron graft  SURGEON: Judeth Cornfield. Scot Dock, MD, FACS  ASSIST: Gerri Lins PA  ANESTHESIA: Gen.   EBL: minimal  INDICATIONS: RAKSHA WOLFGANG is a 68 y.o. female who presented with postprandial abdominal pain and weight loss. 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 She was not a candidate for an endovascular approach to this. She was brought in for mesenteric bypass.  FINDINGS: there was some irregularity of the superior mesenteric artery distal to the occlusion however the artery was patent and was an excellent Doppler signal at the completion.  TECHNIQUE: The patient was taken to the operating room after monitoring lines were placed by anesthesia. She received a general anesthetic. The abdomen was prepped and draped in the usual sterile fashion. The previous incision was used and dissection carried down to the anterior rectus fascia. The anterior rectus fascia was divided and the abdominal cavity was entered there were extensive adhesions from her previous appendectomy and cholecystectomy and these were sharply dissected free allowing mobilization of the transverse colon and omentum superiorly. Next the small bowel was reflected to the right and the retroperitoneum over the infrarenal aorta divided allowing exposure of the infrarenal aorta. This dissection was taken up to the left renal vein. The inferior mesenteric vein was divided between 2-0 silk ties. I dissected out the infrarenal aorta for the proximal anastomosis. Next the superior mesenteric artery was identified at the root of the mesentery after  the ligament of Treitz was divided allowing mobilization of the duodenum to the patient's right. The area of the superior mesenteric artery done on the occlusion was identified and controlled with a vessel loop. One small branch was controlled also. The patient was heparinized. Attention was first turned to the anastomosis to the superior mesenteric artery. The artery was clamped proximally and distally and a longitudinal arteriotomy was made. A 6 mm graft was sewn end to side to the artery using continuous 5-0 Prolene suture. After completing the anastomosis there was good backbleeding and the graft was clamped. I then allowed the small bowel to fall back into place to measure the distance for the anastomosis to the aorta. An infrarenal clamp was placed just below the renal arteries and a second clamp distally. A longitudinal arteriotomy was made and then using a 5 mm punch aortotomy was made. The 6 mm graft was sewn in end to side to the aorta using a 5-0 Prolene suture. At the completion there was an excellent signal in the superior mesenteric artery with the graft open.the heparin was partially reversed with protamine. The retroperitoneum was closed with running 2-0 Vicryl. The abdominal contents were returned to their normal position and the small bowel was run to prevent twisting. The fascial layer was closed with running 2 #1 PDS sutures. The subcutaneous layer was closed with running 2-0 Vicryl. The skin was closed with a 4-0 subcuticular stitch. The patient tolerated the procedure well and was transferred to the recovery room in stable condition. All needle and sponge counts were correct.  [name], MD, FACS [facility]  DATE OF DICTATION:   01/27/2014

## 2014-01-28 ENCOUNTER — Encounter (HOSPITAL_COMMUNITY): Payer: Self-pay | Admitting: Vascular Surgery

## 2014-01-28 ENCOUNTER — Inpatient Hospital Stay (HOSPITAL_COMMUNITY): Payer: Medicare Other

## 2014-01-28 LAB — COMPREHENSIVE METABOLIC PANEL
ALT: 32 U/L (ref 0–35)
AST: 34 U/L (ref 0–37)
Albumin: 3.3 g/dL — ABNORMAL LOW (ref 3.5–5.2)
Alkaline Phosphatase: 59 U/L (ref 39–117)
Anion gap: 12 (ref 5–15)
BUN: 6 mg/dL (ref 6–23)
CALCIUM: 8 mg/dL — AB (ref 8.4–10.5)
CO2: 24 meq/L (ref 19–32)
CREATININE: 0.74 mg/dL (ref 0.50–1.10)
Chloride: 105 mEq/L (ref 96–112)
GFR calc Af Amer: >90 mL/min (ref >90)
GFR, EST NON AFRICAN AMERICAN: 86 mL/min — AB (ref >90)
Glucose, Bld: 124 mg/dL — ABNORMAL HIGH (ref 70–99)
Potassium: 4 mEq/L (ref 3.7–5.3)
Sodium: 141 mEq/L (ref 137–147)
Total Bilirubin: 0.6 mg/dL (ref 0.3–1.2)
Total Protein: 6.2 g/dL (ref 6.0–8.3)

## 2014-01-28 LAB — CBC
HCT: 33.2 % — ABNORMAL LOW (ref 36.0–46.0)
Hemoglobin: 11.2 g/dL — ABNORMAL LOW (ref 12.0–15.0)
MCH: 30.2 pg (ref 26.0–34.0)
MCHC: 33.7 g/dL (ref 30.0–36.0)
MCV: 89.5 fL (ref 78.0–100.0)
Platelets: 302 K/uL (ref 150–400)
RBC: 3.71 MIL/uL — ABNORMAL LOW (ref 3.87–5.11)
RDW: 13 % (ref 11.5–15.5)
WBC: 7.4 K/uL (ref 4.0–10.5)

## 2014-01-28 LAB — MAGNESIUM: MAGNESIUM: 1.8 mg/dL (ref 1.5–2.5)

## 2014-01-28 LAB — AMYLASE: Amylase: 36 U/L (ref 0–105)

## 2014-01-28 NOTE — Progress Notes (Signed)
INITIAL NUTRITION ASSESSMENT  DOCUMENTATION CODES Per approved criteria  -Not Applicable   INTERVENTION: Once diet upgraded, add Resource Breeze po BID, each supplement provides 250 kcal and 9 grams of protein  NUTRITION DIAGNOSIS: Inadequate oral intake related to inability to eat as evidenced by NPO.   Goal: Pt to meet >/= 90% of their estimated nutrition needs   Monitor:  Weight trends, NPO status, labs, bowel movements  Reason for Assessment: MST  68 y.o. female  Admitting Dx: <principal problem not specified>  ASSESSMENT: 68 y.o. female is s/p aorta to superior mesenteric artery bypass using 6 mm Dacron graft, exploratory laparotomy and lysis of adhesions.   - Pt NPO and has NG tube in place.  - Reports that she was trying to lose weight prior to getting sick, and then lost weight from "feeling bad."  - Pt reports no appetite and feels afraid to eat once diet is advanced. Will send clear liquid nutritional supplements which patient agreed to try once diet upgraded.  - Pt with no signs of fat or muscle wasting.   Na and K WNL  Height: Ht [place] from Last 1 Encounters:  01/27/14 5\' 4"  (1.626 m)    Weight: Wt [place] from Last 1 Encounters:  01/28/14 153 lb 10.6 oz (69.7 kg)    Ideal Body Weight: 54.7 kg  % Ideal Body Weight: 127%  Wt [place] from Last 10 Encounters:  01/28/14 153 lb 10.6 oz (69.7 kg)  01/28/14 153 lb 10.6 oz (69.7 kg)  01/16/14 156 lb 4.9 oz (70.9 kg)  01/16/14 156 lb 4.9 oz (70.9 kg)  11/26/13 157 lb 4.8 oz (71.351 kg)  04/05/13 161 lb 6.4 oz (73.211 kg)  12/15/12 158 lb 8 oz (71.895 kg)  12/07/12 161 lb 12.8 oz (73.392 kg)  10/29/12 158 lb (71.668 kg)  08/19/12 158 lb (71.668 kg)    Usual Body Weight: 158 lbs  % Usual Body Weight: 97%  BMI:  Body mass index is 26.36 kg/(m^2).  Estimated Nutritional Needs: Kcal: 1800-2000 Protein: 95-105 g Fluid: 1.8-2.0 L/day  Skin: Closed incision on abdomen  Diet Order:  NPO  EDUCATION NEEDS: -Education needs addressed   Intake/Output Summary (Last 24 hours) at 01/28/14 1101 Last data filed at 01/28/14 0900  Gross per 24 hour  Intake 2289.87 ml  Output   1010 ml  Net 1279.87 ml    Last BM: prior to admission  Labs:   Recent Labs Lab 01/25/14 1445 01/27/14 1002 01/27/14 1053 01/28/14 0500  NA 137 138 140 141  K 4.6 3.6* 3.7 4.0  CL 102  --  104 105  CO2 20  --  23 24  BUN 11  --  11 6  CREATININE 0.72  --  0.76 0.74  CALCIUM 8.5  --  7.9* 8.0*  MG  --   --  1.6 1.8  GLUCOSE 106*  --  138* 124*    CBG (last 3)  No results found for this basename: GLUCAP,  in the last 72 hours  Scheduled Meds: . antiseptic oral rinse  7 mL Mouth Rinse BID  . aspirin EC  81 mg Oral Daily  . Chlorhexidine Gluconate Cloth  6 each Topical Q0600  . clorazepate  7.5 mg Oral Daily  . docusate sodium  100 mg Oral Daily  . enoxaparin (LOVENOX) injection  30 mg Subcutaneous Q24H  . mupirocin ointment   Nasal BID  . pantoprazole (PROTONIX) IV  40 mg Intravenous Q24H    Continuous  Infusions: . dextrose 5 % and 0.45 % NaCl with KCl 20 mEq/L 75 mL/hr at 01/28/14 0800    Past Medical History  Diagnosis Date  . Hypertension 11/30/2010    echo- EF 55%; normal w/ mildly sclerotic aortic valve  . Hyperlipidemia   . Coronary artery disease   . GERD (gastroesophageal reflux disease)   . Osteopenia   . Sigmoid diverticulitis   . Peripheral vascular disease   . Atrial fibrillation   . Nonspecific ST-T wave electrocardiographic changes 03/26/2011    R/Lmv - EF 74%, normal perfusion all regions, ST depression w/ Lexiscan infusion w/o assoc angina  . CAD (coronary artery disease)   . PAD (peripheral artery disease)   . PVD (peripheral vascular disease) 11/05/2011    doppler - R/L brachial pressures essentially equal w/o inflow disease; L sublclavian/CCA bypass graft demonstrates patent flow, no evidence of significant stenosis  . Hypertension 11/05/11    renal  dopplers - celiac artery and SMA >50% diameter reduction, R renal artery - mildly elevated velocities 1-59% diameter reduction, L renal artery normal  . Claudication 01/06/2008    Lower extremity dopplers - no evidence of arterial insufficiency, normal exam  . Anxiety     Past Surgical History  Procedure Laterality Date  . Cholecystectomy    . Appendectomy    . Abdominal hysterectomy    . Subclavian artery stents  01/23/2010    Left carotid to subclavian artery Bypass  . Spine surgery  Feb. 2011  . Subclavian artery stent  1995    X's  several  . Subclavian artery stent Left 09/20/2008    LSA ISR 7x40mm Cordis Genesis on Opta premount, reduction from 80% to 0%  . Cardiac catheterization  06/03/2008    60% LAD, involving D2, borderline significant by IVUS, medical therapy. CFX, RCA OK.  . Eye surgery      retinal surgery  . Abdominal aortic aneurysm repair N/A 01/27/2014    Procedure: AORTO-SUPERIOR MESENTERIC ARTERY BYPASS GRAFT;  Surgeon: Angelia Mould, MD;  Location: [facility];  Service: Vascular;  Laterality: N/A;    Terrace Arabia RD, LDN

## 2014-01-28 NOTE — Progress Notes (Signed)
PT Cancellation Note  Patient Details Name: Traci [name] MRN: [id number] DOB: 26-Jul-1945   Cancelled Treatment:    Reason Eval/Treat Not Completed: Other (comment) (Pt had been up in chair earlier today and just transferred to new unit. Fatigued at this time.) Will see in AM.   [name] 01/28/2014, 3:07 PM  [facility] PT [phone]

## 2014-01-28 NOTE — Progress Notes (Signed)
Pt transported to 2W35, VS WNL, A-line D/C'd per MD order, pressure held and dressing CDI upon arrival to 2W. Pt alert oriented, no complaints other than mild pain in abdomen. Bedside report given to Cecille Rubin, Therapist, sports. Pt safety maintained during transport.

## 2014-01-28 NOTE — Progress Notes (Signed)
   VASCULAR SURGERY ASSESSMENT & PLAN:  * 1 Day Post-Op s/p: Aorto-SMA Bypass.   *  GI/Nutrition: Minimal NG tube output. Has some bowel sounds. Therefore, we will discontinue NG tube.  Keep npo until she passes flatus or BM.  * Cardiac: Hemodynamically stable  * Pulmonary: incentive spirometry  * Lovenox for DVT prophylaxis  * Begin to mobilize.  * Transfer to 2 W.  SUBJECTIVE: Had some nausea last night which has resolved. Pain adequately controlled.  PHYSICAL EXAM: Filed Vitals:   01/28/14 0100 01/28/14 0200 01/28/14 0300 01/28/14 0403  BP: 124/46 129/43 121/47   Pulse: 67 71 66   Temp:    99.9 F (37.7 C)  TempSrc:    Oral  Resp: 22 24 20    Height:      Weight:      SpO2: 93% 93% 96%    Lungs clear Has some bowel sounds. Dressing dry. Feet are warm and well perfused.  LABS: Lab Results  Component Value Date   WBC 10.7* 01/27/2014   HGB 10.6* 01/27/2014   HCT 31.3* 01/27/2014   MCV 88.7 01/27/2014   PLT 268 01/27/2014   Lab Results  Component Value Date   CREATININE 0.76 01/27/2014   Lab Results  Component Value Date   INR 1.18 01/27/2014   Active Problems:   Chronic mesenteric ischemia   SMA stenosis  Gae Gallop Beeper: [phone] 01/28/2014

## 2014-01-28 NOTE — Progress Notes (Signed)
OT Cancellation Note  Patient Details Name: Traci [name] MRN: [id number] DOB: 28-Jan-1946   Cancelled Treatment:    Reason Eval/Treat Not Completed: Fatigue/lethargy limiting ability to participate;Pain limiting ability to participate - will reattempt.  Darlina Rumpf McClelland, OTR/L [phone]  01/28/2014, 3:49 PM

## 2014-01-29 MED ORDER — ENOXAPARIN SODIUM 40 MG/0.4ML ~~LOC~~ SOLN
40.0000 mg | SUBCUTANEOUS | Status: DC
  Administered 2014-01-30 – 2014-02-03 (×5): 40 mg via SUBCUTANEOUS
  Filled 2014-01-29 (×6): qty 0.4

## 2014-01-29 NOTE — Progress Notes (Addendum)
     Subjective  - No flatus.  No nausea or vomiting.     Objective 131/52 73 99.4 F (37.4 C) (Oral) 18 99%  Intake/Output Summary (Last 24 hours) at 01/29/14 0918 Last data filed at 01/29/14 0600  Gross per 24 hour  Intake   1710 ml  Output   1450 ml  Net    260 ml   Abdomin without distention, soft. No BS Dressing clean and dry. Palpable PT pulses bilateral.   Assessment/Planning: POD #2 s/p: Aorto-SMA Bypass.  Maintain NG tube, no BS present Lovenox DVT prophylaxis Ambulate as tolerates   Traci [name], Traci [name] 01/29/2014 9:18 AM --  Laboratory Lab Results:  Recent Labs  01/27/14 1053 01/28/14 0500  WBC 10.7* 7.4  HGB 10.6* 11.2*  HCT 31.3* 33.2*  PLT 268 302   BMET  Recent Labs  01/27/14 1053 01/28/14 0500  NA 140 141  K 3.7 4.0  CL 104 105  CO2 23 24  GLUCOSE 138* 124*  BUN 11 6  CREATININE 0.76 0.74  CALCIUM 7.9* 8.0*    COAG Lab Results  Component Value Date   INR 1.18 01/27/2014   INR 1.02 01/25/2014   INR 1.16 01/16/2014   No results found for this basename: PTT   Addendum  I have independently interviewed and examined the patient, and I agree with the physician assistant's findings.  Awating return of BF.  NGT still with copious bilious drainage.  Ok to continue foley as patient essentially tethered to wall due to NGT.  Sit up periodically.  Lovenox changed to 40 mg as pt has normal renal function.  Adele Barthel, MD [facility] [place] Office: [phone] Pager: [phone]  01/29/2014, 11:43 AM

## 2014-01-29 NOTE — Evaluation (Signed)
Physical Therapy Evaluation Patient Details Name: Traci [name] MRN: [id number] DOB: August 27, 1945 Today's Date: 01/29/2014   History of Present Illness  Pt s/p aorta to superior mesenteric artery bypass due to chronic mesenteric ischemia. PMH- back surgery, CAD, PVD.  Clinical Impression  Pt admitted with above. Pt currently with functional limitations due to the deficits listed below (see PT Problem List).  Pt will benefit from skilled PT to increase their independence and safety with mobility to allow discharge to the venue listed below.       Follow Up Recommendations Home health PT;Supervision - Intermittent    Equipment Recommendations  None recommended by PT    Recommendations for Other Services       Precautions / Restrictions Precautions Precautions: Fall      Mobility  Bed Mobility Overal bed mobility: Needs Assistance Bed Mobility: Supine to Sit;Sit to Supine     Supine to sit: Mod assist;HOB elevated Sit to supine: Min assist   General bed mobility comments: Assist to bring trunk up into sitting. Assist to bring legs back up into bed for return to supine.  Transfers Overall transfer level: Needs assistance Equipment used: Rolling walker (2 wheeled) Transfers: Sit to/from Stand Sit to Stand: Min assist         General transfer comment: Assist to bring hips up.  Ambulation/Gait Ambulation/Gait assistance: Min guard Ambulation Distance (Feet): 25 Feet Assistive device: Rolling walker (2 wheeled) Gait Pattern/deviations: Step-through pattern;Decreased step length - right;Decreased step length - left;Shuffle;Trunk flexed Gait velocity: slow Gait velocity interpretation: Below normal speed for age/gender General Gait Details: Verbal cues to stand more erect.  Stairs            Wheelchair Mobility    Modified Rankin (Stroke Patients Only)       Balance Overall balance assessment: Needs assistance Sitting-balance support: No upper  extremity supported;Feet supported Sitting balance-Leahy Scale: Good     Standing balance support: Bilateral upper extremity supported Standing balance-Leahy Scale: Poor Standing balance comment: Support of walker                             Pertinent Vitals/Pain Pain Assessment: Faces Faces Pain Scale: Hurts little more Pain Location: abdomen Pain Intervention(s): Limited activity within patient's tolerance;Premedicated before session;Repositioned    Home Living Family/patient expects to be discharged to:: Private residence Living Arrangements: Children Available Help at Discharge: Available PRN/intermittently Type of Home: [place]: One level Home Equipment: Environmental consultant - 2 wheels      Prior Function Level of Independence: Independent               Hand Dominance        Extremity/Trunk Assessment   Upper Extremity Assessment: Overall WFL for tasks assessed           Lower Extremity Assessment: Generalized weakness         Communication   Communication: No difficulties  Cognition Arousal/Alertness: Awake/alert Behavior During Therapy: WFL for tasks assessed/performed Overall Cognitive Status: Within Functional Limits for tasks assessed                      General Comments      Exercises        Assessment/Plan    PT Assessment Patient needs continued PT services  PT Diagnosis Difficulty walking;Acute pain;Generalized weakness   PT Problem List Decreased strength;Decreased activity tolerance;Decreased balance;Decreased mobility;Pain  PT Treatment Interventions DME instruction;Gait training;Functional mobility training;Therapeutic activities;Therapeutic exercise;Balance training;Stair training;Patient/family education   PT Goals (Current goals can be found in the Care Plan section) Acute Rehab PT Goals Patient Stated Goal: Return home PT Goal Formulation: With patient Time For Goal Achievement:  02/05/14 Potential to Achieve Goals: Good    Frequency Min 3X/week   Barriers to discharge Decreased caregiver support Son works at night    Co-evaluation               End of Session   Activity Tolerance: Patient tolerated treatment well Patient left: in bed;with call bell/phone within reach Nurse Communication: Mobility status         Time: 3094-0768 PT Time Calculation (min): 22 min   Charges:   PT Evaluation $Initial PT Evaluation Tier I: 1 Procedure PT Treatments $Gait Training: 8-22 mins   PT G Codes:          Gaylan Fauver 12-Feb-2014, 3:32 PM  [facility] PT [phone]

## 2014-01-30 LAB — CBC
HEMATOCRIT: 32.1 % — AB (ref 36.0–46.0)
HEMOGLOBIN: 10.7 g/dL — AB (ref 12.0–15.0)
MCH: 30.4 pg (ref 26.0–34.0)
MCHC: 33.3 g/dL (ref 30.0–36.0)
MCV: 91.2 fL (ref 78.0–100.0)
Platelets: 289 10*3/uL (ref 150–400)
RBC: 3.52 MIL/uL — ABNORMAL LOW (ref 3.87–5.11)
RDW: 12.7 % (ref 11.5–15.5)
WBC: 5.7 10*3/uL (ref 4.0–10.5)

## 2014-01-30 NOTE — Progress Notes (Addendum)
     Subjective  - No Flatus, sore throat.   Objective 152/49 79 99.4 F (37.4 C) (Oral) 20 99%  Intake/Output Summary (Last 24 hours) at 01/30/14 0811 Last data filed at 01/30/14 0600  Gross per 24 hour  Intake      1 ml  Output   2000 ml  Net  -1999 ml    Positive BS, soft.  Pain with trial of removal of dressing( patient requested to leave it alone.) NG tube in place Palpable DP pulses  Assessment/Planning: POD #3 s/p: Aorto-SMA Bypass.  Lovenox DVT prophylaxis Ambulated to the room door yesterday Foley to gravity will D/C today NG tube in place will D/C   Laurence Slate [facility] 01/30/2014 8:11 AM --  Laboratory Lab Results:  Recent Labs  01/27/14 1053 01/28/14 0500  WBC 10.7* 7.4  HGB 10.6* 11.2*  HCT 31.3* 33.2*  PLT 268 302   BMET  Recent Labs  01/27/14 1053 01/28/14 0500  NA 140 141  K 3.7 4.0  CL 104 105  CO2 23 24  GLUCOSE 138* 124*  BUN 11 6  CREATININE 0.76 0.74  CALCIUM 7.9* 8.0*    COAG Lab Results  Component Value Date   INR 1.18 01/27/2014   INR 1.02 01/25/2014   INR 1.16 01/16/2014   No results found for this basename: PTT   Addendum  I have independently interviewed and examined the patient, and I agree with the physician assistant's findings.  Ok to D/C NGT.  Clear diet.  D/C foley.  OOB  Adele Barthel, MD [facility] [place] Office: [phone] Pager: [phone]  01/30/2014, 8:47 AM

## 2014-01-30 NOTE — Progress Notes (Signed)
Pt walked 60 ft. With walker, no o2 , pt refused to let me take out her foley , but she did agree to have it taken out first thing in the morning 0500 am.

## 2014-01-31 LAB — BASIC METABOLIC PANEL
ANION GAP: 8 (ref 5–15)
BUN: 8 mg/dL (ref 6–23)
CHLORIDE: 104 meq/L (ref 96–112)
CO2: 29 meq/L (ref 19–32)
CREATININE: 0.67 mg/dL (ref 0.50–1.10)
Calcium: 8.5 mg/dL (ref 8.4–10.5)
GFR calc Af Amer: >90 mL/min (ref >90)
GFR calc non Af Amer: 89 mL/min — ABNORMAL LOW (ref >90)
GLUCOSE: 103 mg/dL — AB (ref 70–99)
Potassium: 3.9 mEq/L (ref 3.7–5.3)
Sodium: 141 mEq/L (ref 137–147)

## 2014-01-31 NOTE — Evaluation (Signed)
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 PMH- back surgery, CAD, PVD.   Clinical Impression   Patient is s/p superior mesenteric artery bypass surgery resulting in functional limitations due to the deficits listed below (see OT problem list). PTA independent. Patient will benefit from skilled OT acutely to increase independence and safety with ADLS to allow discharge [place]. Ot to follow acutely with adl retraining for LB and safety with RW.     Follow Up Recommendations  Home health OT    Equipment Recommendations  None recommended by OT    Recommendations for Other Services       Precautions / Restrictions Precautions Precautions: Fall      Mobility Bed Mobility Overal bed mobility: Needs Assistance Bed Mobility: Supine to Sit;Sit to Supine     Supine to sit: Supervision Sit to supine: Supervision   General bed mobility comments: pt able to exit bed with extended time. pt log rolling with bed rail assess  Transfers Overall transfer level: Needs assistance   Transfers: Sit to/from Stand Sit to Stand: Min guard         General transfer comment: cues for hand placement. Pt pulling on RW. pt states "they told me to do it this way" Pt educated on not pulling with BIL UE    Balance           Standing balance support: No upper extremity supported;During functional activity Standing balance-Leahy Scale: Fair                              ADL Overall ADL's : Needs assistance/impaired Eating/Feeding: Modified independent   Grooming: Wash/dry hands;Min guard;Standing Grooming Details (indicate cue type and reason): cues for safety with RW     Lower Body Bathing: Moderate assistance;Sit to/from stand           Toilet Transfer: Min  guard;Ambulation;BSC;RW           Functional mobility during ADLs: Min guard;Rolling walker General ADL Comments: Pt needs cues throughout session for safety with RW. Pt attempting to abandon RW. Question next session trial without RW to assess balance further.      Vision                     Perception     Praxis      Pertinent Vitals/Pain Pain Assessment: 0-10 Pain Score: 4  Faces Pain Scale: Hurts little more Pain Location: abdomen Pain Descriptors / Indicators: Tender     Hand Dominance Right   Extremity/Trunk Assessment Upper Extremity Assessment Upper Extremity Assessment: Overall WFL for tasks assessed   Lower Extremity Assessment Lower Extremity Assessment: Defer to PT evaluation   Cervical / Trunk Assessment Cervical / Trunk Assessment: Normal   Communication Communication Communication: No difficulties   Cognition Arousal/Alertness: Awake/alert Behavior During Therapy: WFL for tasks assessed/performed Overall Cognitive Status: Within Functional Limits for tasks assessed                     General Comments       Exercises       Shoulder Instructions      Home Living Family/patient expects to be discharged to:: Private residence Living Arrangements: Children Available Help at Discharge: Available PRN/intermittently Type of Home:  House       Home Layout: One level     Bathroom Shower/Tub: Tub/shower unit;Curtain   Biochemist, clinical: Standard     Home Equipment: Environmental consultant - 2 wheels   Additional Comments: can borrow bsc, shower seat with back .      Prior Functioning/Environment Level of Independence: Independent             OT Diagnosis: Generalized weakness;Acute pain   OT Problem List: Decreased strength;Decreased activity tolerance;Impaired balance (sitting and/or standing);Decreased safety awareness;Decreased knowledge of use of DME or AE;Decreased knowledge of precautions;Pain   OT Treatment/Interventions:  Self-care/ADL training;Therapeutic exercise;DME and/or AE instruction;Therapeutic activities;Patient/family education;Balance training    OT Goals(Current goals can be found in the care plan section) Acute Rehab OT Goals Patient Stated Goal: Return home OT Goal Formulation: With patient Time For Goal Achievement: 02/14/14 Potential to Achieve Goals: Good  OT Frequency: Min 2X/week   Barriers to D/C:            Co-evaluation              End of Session Equipment Utilized During Treatment: Gait belt;Rolling walker Nurse Communication: Mobility status;Precautions  Activity Tolerance: Patient tolerated treatment well Patient left: in bed;with family/visitor present;with call bell/phone within reach   Time: 1410-1436 OT Time Calculation (min): 26 min Charges:  OT General Charges $OT Visit: 1 Procedure OT Evaluation $Initial OT Evaluation Tier I: 1 Procedure OT Treatments $Self Care/Home Management : 8-22 mins G-Codes:    Parke Poisson B 02-09-2014, 3:00 PM Pager: [phone]

## 2014-01-31 NOTE — Progress Notes (Addendum)
   Agree with note below. Patient is doing well status post aorto SMA bypass (POD 4) and I will begin her on full with its today. She has passed flatus. He is to continue to ambulate. If she tolerates her by mouth she can be started on her po meds. Anticipate discharge Wednesday or Thursday.  [name], MD, FACS Beeper [phone] 01/31/2014  Subjective  - No BM or flatus yet.  She is tolerating clear liquids without nausea.  Objective 130/45 71 97.9 F (36.6 C) (Oral) 18 98%  Intake/Output Summary (Last 24 hours) at 01/31/14 0935 Last data filed at 01/31/14 0540  Gross per 24 hour  Intake    120 ml  Output   1375 ml  Net  -1255 ml    Incision is clean and dry +BS to auscultation Distally Bil. LE palpable DP Heart RRR Lungs CTA  Assessment/Planning: POD #4 s/p: Aorto-SMA Bypass.  Lovenox DVT prophylaxis  Ambulated in the halls  Foley to gravity will D/C today Tolerating clear liquids well, will advance her diet once she has pass flatus No flatus or BM continue bowel regimen   Theda Sers, EMMA [facility] 01/31/2014 9:35 AM --  Laboratory Lab Results:  Recent Labs  01/30/14 2326  WBC 5.7  HGB 10.7*  HCT 32.1*  PLT 289   BMET  Recent Labs  01/30/14 2326  NA 141  K 3.9  CL 104  CO2 29  GLUCOSE 103*  BUN 8  CREATININE 0.67  CALCIUM 8.5

## 2014-01-31 NOTE — Progress Notes (Signed)
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 PMH- back surgery, CAD, PVD.    PT Comments    Pt progressing well, still limited by abdominal pain. Ambulated 225' with RW and supervision today, decreased speed. PT will continue to follow.   Follow Up Recommendations  Home health PT;Supervision - Intermittent     Equipment Recommendations  None recommended by PT    Recommendations for Other Services       Precautions / Restrictions Precautions Precautions: Fall Restrictions Weight Bearing Restrictions: No    Mobility  Bed Mobility Overal bed mobility: Needs Assistance Bed Mobility: Supine to Sit     Supine to sit: Supervision Sit to supine: Supervision   General bed mobility comments: increased time needed due to pain and vc's for pt to fully roll to use wt of legs off bed to  assist up instead of legs off bed before rolling  Transfers Overall transfer level: Needs assistance Equipment used: Rolling walker (2 wheeled) Transfers: Sit to/from Stand Sit to Stand: Supervision         General transfer comment: pt progrerssing with transfers but still painful, supervision for safety  Ambulation/Gait Ambulation/Gait assistance: Min guard Ambulation Distance (Feet): 225 Feet Assistive device: Rolling walker (2 wheeled) Gait Pattern/deviations: Step-through pattern;Decreased stride length Gait velocity: decreased   General Gait Details: guarded gait and vc's for posture   Stairs            Wheelchair Mobility    Modified Rankin (Stroke Patients Only)       Balance Overall balance assessment: Needs assistance Sitting-balance support: No upper extremity supported;Feet supported Sitting balance-Leahy Scale: Good     Standing balance support: No upper  extremity supported;During functional activity Standing balance-Leahy Scale: Fair                      Cognition Arousal/Alertness: Awake/alert Behavior During Therapy: WFL for tasks assessed/performed Overall Cognitive Status: Within Functional Limits for tasks assessed                      Exercises      General Comments General comments (skin integrity, edema, etc.): O2 sats 99% after ambulation, left O2 off and informed RN      Pertinent Vitals/Pain Pain Assessment: 0-10 Pain Score: 3  Faces Pain Scale: Hurts little more Pain Location: abdomen Pain Descriptors / Indicators: Tender Pain Intervention(s): Monitored during session    Home Living Family/patient expects to be discharged to:: Private residence Living Arrangements: Children Available Help at Discharge: Available PRN/intermittently Type of Home: House     Home Layout: One level Home Equipment: Walker - 2 wheels Additional Comments: can borrow bsc, shower seat with back .    Prior Function Level of Independence: Independent          PT Goals (current goals can now be found in the care plan section) Acute Rehab PT Goals Patient Stated Goal: Return home PT Goal Formulation: With patient Time For Goal Achievement: 02/05/14 Potential to Achieve Goals: Good Progress towards PT goals: Progressing toward goals    Frequency  Min 3X/week    PT Plan Current plan remains appropriate    Co-evaluation             End of Session   Activity Tolerance: Patient tolerated treatment  well Patient left: in bed;with call bell/phone within reach     Time: 1252-1323 PT Time Calculation (min): 31 min  Charges:  $Gait Training: 23-37 mins                    G Codes:     Leighton Roach, PT  Acute Rehab Services  [phone]  Leighton Roach 01/31/2014, 3:23 PM

## 2014-02-01 LAB — TYPE AND SCREEN
ABO/RH(D): A POS
Antibody Screen: NEGATIVE
Unit division: 0
Unit division: 0

## 2014-02-01 MED ORDER — OXYCODONE-ACETAMINOPHEN 5-325 MG PO TABS
1.0000 | ORAL_TABLET | ORAL | Status: DC | PRN
  Administered 2014-02-01 – 2014-02-03 (×7): 1 via ORAL
  Filled 2014-02-01: qty 1
  Filled 2014-02-01: qty 2
  Filled 2014-02-01 (×5): qty 1

## 2014-02-01 MED ORDER — PANTOPRAZOLE SODIUM 40 MG PO TBEC
40.0000 mg | DELAYED_RELEASE_TABLET | Freq: Every day | ORAL | Status: DC
  Administered 2014-02-01 – 2014-02-02 (×2): 40 mg via ORAL
  Filled 2014-02-01 (×2): qty 1

## 2014-02-01 NOTE — Progress Notes (Addendum)
   Agree with note below. Advance diet.  Restart home po meds Dulcolax suppository. Ambulate Anticipate D/C Thursday.   [name], MD, FACS Beeper [phone] 02/01/2014  Subjective  - Tolerating full liquid diet fine, wants to advance to regular diet for lunch.  Objective 161/50 69 99 F (37.2 C) (Oral) 18 96%  Intake/Output Summary (Last 24 hours) at 02/01/14 0723 Last data filed at 02/01/14 0300  Gross per 24 hour  Intake   1980 ml  Output      0 ml  Net   1980 ml   Abdomin mildly distended, incisional tenderness. Incision is C/D/I Feet are warm and well perfused  Assessment/Planning: POD #5 s/p: Aorto-SMA Bypass.  Lovenox DVT prophylaxis  Ambulated in the halls  Positive flatus advance diet as tolerates Start PO pain medication today Bowel regimen to be continued Plan fore discharge tomorrow or Thursday  Laurence Slate [facility] 02/01/2014 7:23 AM --  Laboratory Lab Results:  BMET  Recent Labs  01/30/14 2326  NA 141  K 3.9  CL 104  CO2 29  GLUCOSE 103*  BUN 8  CREATININE 0.67  CALCIUM 8.5    COAG Lab Results  Component Value Date   INR 1.18 01/27/2014   INR 1.02 01/25/2014   INR 1.16 01/16/2014   No results found for this basename: PTT

## 2014-02-01 NOTE — Progress Notes (Signed)
At bedside to place PIV and D/C central line per MD order. Pt requests to leave the central line and not be stuck. Risk of central line infection explained and pt still requests not to remove it. Floor RN aware. Lorri Frederick, RN IV Team

## 2014-02-01 NOTE — Progress Notes (Signed)
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 PMH- back surgery, CAD, PVD.    PT Comments    Pt admitted with above surgery. Pt currently with functional limitations due to endurance issues that are improving daily.  Can most likely be d/c'd from PT after next visit as goals should be met.  Pt will benefit from skilled PT to increase their independence and safety with mobility to allow discharge to the venue listed below.   Follow Up Recommendations  Home health PT;Supervision - Intermittent     Equipment Recommendations  None recommended by PT    Recommendations for Other Services       Precautions / Restrictions Precautions Precautions: Fall Restrictions Weight Bearing Restrictions: No    Mobility  Bed Mobility Overal bed mobility: Needs Assistance Bed Mobility: Supine to Sit     Supine to sit: Supervision Sit to supine: Supervision   General bed mobility comments: incr time but less cues needed  Transfers Overall transfer level: Needs assistance Equipment used: Rolling walker (2 wheeled) Transfers: Sit to/from Stand Sit to Stand: Supervision         General transfer comment: pt progrerssing with transfers but still painful, supervision for safety  Ambulation/Gait Ambulation/Gait assistance: Min guard Ambulation Distance (Feet): 350 Feet Assistive device: Rolling walker (2 wheeled) Gait Pattern/deviations: Step-through pattern;Decreased stride length;Decreased step length - right;Decreased step length - left;Trunk flexed Gait velocity: decreased Gait velocity interpretation: Below normal speed for age/gender General Gait Details: guarded gait and vc's for posture   Stairs            Wheelchair Mobility    Modified Rankin (Stroke Patients Only)        Balance           Standing balance support: No upper extremity supported;During functional activity Standing balance-Leahy Scale: Fair                      Cognition Arousal/Alertness: Awake/alert Behavior During Therapy: WFL for tasks assessed/performed Overall Cognitive Status: Within Functional Limits for tasks assessed                      Exercises General Exercises - Lower Extremity Ankle Circles/Pumps: AROM;Both;10 reps;Supine Heel Slides: AROM;Both;10 reps;Supine    General Comments General comments (skin integrity, edema, etc.): Declined to practice steps. States she knows how from previous back surgery.      Pertinent Vitals/Pain Pain Assessment: 0-10 Pain Score: 4  Pain Location: abdomen Pain Descriptors / Indicators: Sore Pain Intervention(s): Monitored during session;Premedicated before session;Repositioned VSS    Home Living                      Prior Function            PT Goals (current goals can now be found in the care plan section) Progress towards PT goals: Progressing toward goals    Frequency  Min 3X/week    PT Plan Current plan remains appropriate    Co-evaluation             End of Session   Activity Tolerance: Patient tolerated treatment well Patient left: in bed;with call bell/phone within reach     Time: 1031-1055 PT Time Calculation (min): 24 min  Charges:  $Gait Training: 8-22 mins $Therapeutic  Exercise: 8-22 mins                    G Codes:      INGOLD,Floy Riegler 02/08/2014, 11:20 AM [facility] [phone] (pager)

## 2014-02-01 NOTE — Care Management Note (Signed)
    Page 1 of 2   02/03/2014     3:40:50 PM CARE MANAGEMENT NOTE 02/03/2014  Patient:  [facility]   Account Number:  [id number]  Date Initiated:  01/28/2014  Documentation initiated by:  [facility]  Subjective/Objective Assessment:   s/p: Aorto-SMA Bypass     Action/Plan:   waiting PT/OT evaluation   Anticipated DC Date:  02/02/2014   Anticipated DC Plan:  [place]  CM consult      [facility] Choice  HOME HEALTH   Choice offered to / List presented to:  C-1 Patient        [place] arranged  HH-1 RN  [place]   Status of service:  Completed, signed off Medicare Important Message given?  YES (If response is "NO", the following Medicare IM given date fields will be blank) Date Medicare IM given:  01/31/2014 Medicare IM given by:  Evalyne Cortopassi Date Additional Medicare IM given:  02/03/2014 Additional Medicare IM given by:  Ellan Lambert  Discharge Disposition:  [place]  Per UR Regulation:  Reviewed for med. necessity/level of care/duration of stay  If discussed at [place] of Stay Meetings, dates discussed:   02/01/2014  02/03/2014    Comments:  02/03/14 Ellan Lambert, RN, BSN [phone] Pt for dc home today.  Referral to [facility], per pt choice.  Start of care 24-48h post dc date.  Pt denies any DME needs for home.  01/31/14 Ellan Lambert, RN, BSN [phone] Met with pt to discuss dc plans.  Pt lives alone, but states she does have some friends to assist if needed.  PT recommending HH at dc, and pt agreeable to [facility] follow up. Would recommend HHRN, PT, and OT follow up at dc; please order if you agree.  Thanks.

## 2014-02-01 NOTE — Progress Notes (Signed)
Chart review complete.  Patient is not eligible for [facility] Care Management services because his/her PCP is not a [facility] primary care provider or is not [facility] affiliated.  For any additional questions or new referrals please contact Tim Henderson BSN RN [facility] Liaison at [phone] °

## 2014-02-02 MED ORDER — SODIUM CHLORIDE 0.9 % IJ SOLN
10.0000 mL | INTRAMUSCULAR | Status: DC | PRN
  Administered 2014-02-02 (×2): 20 mL
  Administered 2014-02-03: 10 mL

## 2014-02-02 NOTE — Progress Notes (Signed)
   VASCULAR SURGERY ASSESSMENT & PLAN:  * 6 Days Post-Op s/p: Aorto-SMA bypass.  *  Tolerating a regular diet so far. + BM yesterday after dulcolax.   * Continue ambulation  * Back on her po meds  * D/C central line  * Anticipate D/C tomorrow.   SUBJECTIVE: No specific complaints.   PHYSICAL EXAM: Filed Vitals:   02/01/14 0505 02/01/14 1257 02/01/14 1952 02/02/14 0518  BP: 161/50 140/48 118/47 130/48  Pulse: 69 66 65 69  Temp: 99 F (37.2 C) 97.7 F (36.5 C) 98.7 F (37.1 C) 98.5 F (36.9 C)  TempSrc: Oral Oral Oral Oral  Resp: 18 20 18 18   Height:      Weight:      SpO2: 96% 98% 95% 95%   Abdomen: soft and NT. Normal pitched BS. No LE swelling  LABS: Lab Results  Component Value Date   WBC 5.7 01/30/2014   HGB 10.7* 01/30/2014   HCT 32.1* 01/30/2014   MCV 91.2 01/30/2014   PLT 289 01/30/2014   Lab Results  Component Value Date   CREATININE 0.67 01/30/2014   Lab Results  Component Value Date   INR 1.18 01/27/2014   Active Problems:   Chronic mesenteric ischemia   SMA stenosis  Gae Gallop Beeper: [phone] 02/02/2014

## 2014-02-02 NOTE — Progress Notes (Signed)
Pt refuses to have central line removed until Thursday.  She does not want a peripheral IV. She says that the IV nurse told her yesterday it had to come out 02/03/14 at the latest. She said she was possibly going home tomorrow and that it could be taken out then. She understands that if she does not leave tomorrow central line will be removed and new access will be added. Payton Emerald, RN

## 2014-02-03 LAB — CREATININE, SERUM
Creatinine, Ser: 0.68 mg/dL (ref 0.50–1.10)
GFR calc Af Amer: >90 mL/min (ref >90)
GFR calc non Af Amer: 89 mL/min — ABNORMAL LOW (ref >90)

## 2014-02-03 MED ORDER — OXYCODONE-ACETAMINOPHEN 5-325 MG PO TABS: 1.0000 | ORAL_TABLET | Freq: Four times a day (QID) | ORAL | Status: DC | PRN

## 2014-02-03 NOTE — Progress Notes (Addendum)
    Agree with no below. She is tolerating her diet, ambulating, and her pain is well-controlled on po pain medication. Plan is for discharge today.  Traci Mayo, MD, FACS Beeper [phone] 02/03/2014  Subjective  - Taking PO's well, ambulating and voiding.  She has a walker and shower bench at home.   Objective 130/45 66 98.9 F (37.2 C) (Oral) 18 95%  Intake/Output Summary (Last 24 hours) at 02/03/14 0726 Last data filed at 02/02/14 1743  Gross per 24 hour  Intake    500 ml  Output      0 ml  Net    500 ml    Feet warm and well perfused Incision healing well on abdomin + BS, Non distended soft abdomin Heart RRR     Assessment/Planning: POD # 7 Aorto-SMA bypass D/C home D/C central line    Traci [name] 02/03/2014 7:26 AM --  Laboratory Lab Results: No results found for this basename: WBC, HGB, HCT, PLT,  in the last 72 hours BMET  Recent Labs  02/03/14 0440  CREATININE 0.68    COAG Lab Results  Component Value Date   INR 1.18 01/27/2014   INR 1.02 01/25/2014   INR 1.16 01/16/2014   No results found for this basename: PTT

## 2014-02-03 NOTE — Discharge Instructions (Signed)
Acute Mesenteric Ischemia Mesentery refers to the tissues that connect the blood vessels to the intestines. Ischemia refers to a restriction in blood supply. Mesenteric ischemia happens when an artery or vein that supports the intestine becomes blocked or narrow. Acute mesenteric ischemia (AMI) happens suddenly and can be life-threatening. CAUSES  When blood supply to the intestine is severely restricted, needed oxygen cannot reach the intestines for proper function. Causes of AMI include:  A blood clot. This may develop due to heart attack, heart failure, or an irregular heartbeat (arrhythmia).  Low blood pressure. This may be due to shock, heart failure, certain medicines, dialysis, or kidney failure. People at the greatest risk for mesenteric ischemia are those over the age of 28 with a history of coronary or vascular disease and people who smoke. SYMPTOMS   Sudden, severe abdominal pain or bloating.  Blood in the stool.  Nausea.  Diarrhea, which is often bloody.  Vomiting.  Fever. DIAGNOSIS  AMI is a medical emergency. Immediate evaluation and treatment is necessary. To confirm a diagnosis of AMI, your caregiver may perform:  A history and physical exam.  X-rays or CT scans.  Blood tests.  Angiography. This imaging test uses a dye to obtain a picture of blood flow to the intestine.  Exploratory laparotomy. This is surgery that opens the abdomen and examines the intestines for signs of tissue death. Dead intestines will need to be removed. TREATMENT  Treatment of AMI almost always means emergency surgery. Patients who need this treatment are very sick and will need to be in the intensive care unit (ICU) after surgery. This is a life-threatening condition. Document Released: 01/21/2011 Document Revised: 08/26/2011 Document Reviewed: 01/21/2011 [facility] Patient Information 2015 [place], [place]. This information is not intended to replace advice given to you by your health care  provider. Make sure you discuss any questions you have with your health care provider.

## 2014-02-04 ENCOUNTER — Telehealth: Payer: Self-pay | Admitting: Vascular Surgery

## 2014-02-04 DIAGNOSIS — I10 Essential (primary) hypertension: Secondary | ICD-10-CM | POA: Diagnosis not present

## 2014-02-04 DIAGNOSIS — I739 Peripheral vascular disease, unspecified: Secondary | ICD-10-CM | POA: Diagnosis not present

## 2014-02-04 DIAGNOSIS — I4891 Unspecified atrial fibrillation: Secondary | ICD-10-CM | POA: Diagnosis not present

## 2014-02-04 DIAGNOSIS — K551 Chronic vascular disorders of intestine: Secondary | ICD-10-CM | POA: Diagnosis not present

## 2014-02-04 DIAGNOSIS — Z48815 Encounter for surgical aftercare following surgery on the digestive system: Secondary | ICD-10-CM | POA: Diagnosis not present

## 2014-02-04 DIAGNOSIS — I2510 Atherosclerotic heart disease of native coronary artery without angina pectoris: Secondary | ICD-10-CM | POA: Diagnosis not present

## 2014-02-04 NOTE — Telephone Encounter (Addendum)
Message copied by Gena Fray on Fri Feb 04, 2014 10:06 AM ------      Message from: Denman George      Created: Thu Feb 03, 2014 10:49 AM      Regarding: Zigmund Daniel log; also needs f/u appt. with [facility] in 3 wks.                   ----- Message -----         From: Ulyses Amor, PA-C         Sent: 02/03/2014   7:25 AM           To: [facility] Charge Pool            F/U in 3 weeks with Dr. Scot Dock s/p SMA by-pass ------  02/04/14: spoke with pt, dpm

## 2014-02-04 NOTE — Discharge Summary (Signed)
[facility] Discharge Summary   Patient ID:  Traci [name] MRN: [id number] DOB/AGE: 1946/05/24 68 y.o.  Admit date: 01/27/2014 Discharge date: 02/04/2014 Date of Surgery: 01/27/2014 Surgeon: Surgeon(s): Angelia Mould, MD  Admission Diagnosis: Chronic Mesenteric Ischemia  Discharge Diagnoses:  Chronic Mesenteric Ischemia  Secondary Diagnoses: Past Medical History  Diagnosis Date  . Hypertension 11/30/2010    echo- EF 55%; normal w/ mildly sclerotic aortic valve  . Hyperlipidemia   . Coronary artery disease   . GERD (gastroesophageal reflux disease)   . Osteopenia   . Sigmoid diverticulitis   . Peripheral vascular disease   . Atrial fibrillation   . Nonspecific ST-T wave electrocardiographic changes 03/26/2011    R/Lmv - EF 74%, normal perfusion all regions, ST depression w/ Lexiscan infusion w/o assoc angina  . CAD (coronary artery disease)   . PAD (peripheral artery disease)   . PVD (peripheral vascular disease) 11/05/2011    doppler - R/L brachial pressures essentially equal w/o inflow disease; L sublclavian/CCA bypass graft demonstrates patent flow, no evidence of significant stenosis  . Hypertension 11/05/11    renal dopplers - celiac artery and SMA >50% diameter reduction, R renal artery - mildly elevated velocities 1-59% diameter reduction, L renal artery normal  . Claudication 01/06/2008    Lower extremity dopplers - no evidence of arterial insufficiency, normal exam  . Anxiety     Procedure(s): AORTO-SUPERIOR MESENTERIC ARTERY BYPASS GRAFT  Discharged Condition: good  HPI: Patient is a 68 y.o. year old female she has lost greater than 10 lbs over the last six months due to abdominal pain.  She was admitted on 01/27/2014 with acute exacerbation of her chronic mesenteric ischemia.  She continued to have postprandial abdominal pain and weight loss and mesenteric revascularization is recommended.         Hospital Course:   Traci [name] is a 68 y.o. female is S/P  Procedure(s): AORTO-SUPERIOR MESENTERIC ARTERY BYPASS GRAFT POD #1 she was in stable condition NG tube was maintained and she was transferred to 2W.  PT consult for mobility was initiated.  POD#3 positive BS.  NG tube and foley were discharged. Clear liquids were initiated.   POD#4home medications and PO pain medications were started full clear liquid to regular diet as tolerated were initiated. POD#5 Positive BM and tolerating full diet.   POD#7 patient in stable condition ambulating, tolerating regular diet, Central line discharged, and pain well controlled.  She was discharged home.  Home PT was ordered for safety and home evaluation.      Significant Diagnostic Studies: CBC Lab Results  Component Value Date   WBC 5.7 01/30/2014   HGB 10.7* 01/30/2014   HCT 32.1* 01/30/2014   MCV 91.2 01/30/2014   PLT 289 01/30/2014    BMET    Component Value Date/Time   NA 141 01/30/2014 2326   NA 144 04/07/2013 0857   K 3.9 01/30/2014 2326   CL 104 01/30/2014 2326   CO2 29 01/30/2014 2326   GLUCOSE 103* 01/30/2014 2326   GLUCOSE 101* 04/07/2013 0857   BUN 8 01/30/2014 2326   BUN 10 04/07/2013 0857   CREATININE 0.68 02/03/2014 0440   CALCIUM 8.5 01/30/2014 2326   GFRNONAA 89* 02/03/2014 0440   GFRAA >90 02/03/2014 0440   COAG Lab Results  Component Value Date   INR 1.18 01/27/2014   INR 1.02 01/25/2014   INR 1.16 01/16/2014     Disposition:  Discharge to :Home Discharge Instructions  Call MD for:  redness, tenderness, or signs of infection (pain, swelling, bleeding, redness, odor or green/yellow discharge around incision site)    Complete by:  As directed      Call MD for:  severe or increased pain, loss or decreased feeling  in affected limb(s)    Complete by:  As directed      Call MD for:  temperature >100.5    Complete by:  As directed      Discharge instructions    Complete by:  As directed   You may shower in 24 hours as  tolerates.  Remove neck dressing in 24 hours before showering.     Driving Restrictions    Complete by:  As directed   No driving for 2 weeks or until you no longer need pain medication.     Lifting restrictions    Complete by:  As directed   No heavey lifting for 6 weeks     Resume previous diet    Complete by:  As directed             Medication List         aspirin EC 81 MG tablet  Take 81 mg by mouth daily.     atenolol 25 MG tablet  Commonly known as:  TENORMIN  Take 1 tablet (25 mg total) by mouth daily.     atorvastatin 10 MG tablet  Commonly known as:  LIPITOR  Take 1 tablet (10 mg total) by mouth every evening.     candesartan 16 MG tablet  Commonly known as:  ATACAND  Take 0.5 tablets (8 mg total) by mouth every morning.     cholecalciferol 1000 UNITS tablet  Commonly known as:  VITAMIN D  Take 1,000 Units by mouth daily.     clopidogrel 75 MG tablet  Commonly known as:  PLAVIX  Take 1 tablet (75 mg total) by mouth daily.     clorazepate 7.5 MG tablet  Commonly known as:  TRANXENE  Take 1 tablet (7.5 mg total) by mouth daily.     dexlansoprazole 60 MG capsule  Commonly known as:  DEXILANT  Take 60 mg by mouth daily.     dicyclomine 20 MG tablet  Commonly known as:  BENTYL  Take 20 mg by mouth every 6 (six) hours as needed for spasms.     EPIPEN 2-PAK 0.3 mg/0.3 mL Soaj injection  Generic drug:  EPINEPHrine  Inject 0.3 mg into the muscle as needed (for allergic reaction).     estradiol 0.5 MG tablet  Commonly known as:  ESTRACE  Take 0.5 mg by mouth daily.     fexofenadine 180 MG tablet  Commonly known as:  ALLEGRA  Take 180 mg by mouth daily.     furosemide 20 MG tablet  Commonly known as:  LASIX  Take 20 mg by mouth every Monday, Wednesday, and Friday.     meclizine 25 MG tablet  Commonly known as:  ANTIVERT  Take 12.5 mg by mouth 3 (three) times daily as needed.     multivitamin with minerals Tabs tablet  Take 1 tablet by mouth  daily.     oxyCODONE-acetaminophen 5-325 MG per tablet  Commonly known as:  PERCOCET/ROXICET  Take 1-2 tablets by mouth every 6 (six) hours as needed for severe pain.     sucralfate 1 G tablet  Commonly known as:  CARAFATE  Take 1 g by mouth 4 (four) times daily.  vitamin B-12 1000 MCG tablet  Commonly known as:  CYANOCOBALAMIN  Take 1,000 mcg by mouth daily.     zolpidem 10 MG tablet  Commonly known as:  AMBIEN  Take 10 mg by mouth at bedtime as needed for sleep.       Verbal and written Discharge instructions given to the patient. Wound care per Discharge AVS     Follow-up Information   Follow up with DICKSON,CHRISTOPHER S, MD In 3 weeks. (sent message to office)    Specialty:  Vascular Surgery   Contact information:   [address] [place] [address] [phone]       Signed: Laurence Slate [facility] 02/04/2014, 2:23 PM

## 2014-02-07 DIAGNOSIS — I10 Essential (primary) hypertension: Secondary | ICD-10-CM | POA: Diagnosis not present

## 2014-02-07 DIAGNOSIS — I4891 Unspecified atrial fibrillation: Secondary | ICD-10-CM | POA: Diagnosis not present

## 2014-02-07 DIAGNOSIS — I2510 Atherosclerotic heart disease of native coronary artery without angina pectoris: Secondary | ICD-10-CM | POA: Diagnosis not present

## 2014-02-07 DIAGNOSIS — K551 Chronic vascular disorders of intestine: Secondary | ICD-10-CM | POA: Diagnosis not present

## 2014-02-07 DIAGNOSIS — I739 Peripheral vascular disease, unspecified: Secondary | ICD-10-CM | POA: Diagnosis not present

## 2014-02-07 DIAGNOSIS — Z48815 Encounter for surgical aftercare following surgery on the digestive system: Secondary | ICD-10-CM | POA: Diagnosis not present

## 2014-02-07 NOTE — Discharge Summary (Signed)
Agree with plans for D/C.  [name], MD, [place] [phone] 02/07/2014

## 2014-02-08 DIAGNOSIS — I2510 Atherosclerotic heart disease of native coronary artery without angina pectoris: Secondary | ICD-10-CM | POA: Diagnosis not present

## 2014-02-08 DIAGNOSIS — K551 Chronic vascular disorders of intestine: Secondary | ICD-10-CM | POA: Diagnosis not present

## 2014-02-08 DIAGNOSIS — I10 Essential (primary) hypertension: Secondary | ICD-10-CM | POA: Diagnosis not present

## 2014-02-08 DIAGNOSIS — I739 Peripheral vascular disease, unspecified: Secondary | ICD-10-CM | POA: Diagnosis not present

## 2014-02-08 DIAGNOSIS — I4891 Unspecified atrial fibrillation: Secondary | ICD-10-CM | POA: Diagnosis not present

## 2014-02-08 DIAGNOSIS — Z48815 Encounter for surgical aftercare following surgery on the digestive system: Secondary | ICD-10-CM | POA: Diagnosis not present

## 2014-02-10 DIAGNOSIS — I10 Essential (primary) hypertension: Secondary | ICD-10-CM | POA: Diagnosis not present

## 2014-02-10 DIAGNOSIS — K551 Chronic vascular disorders of intestine: Secondary | ICD-10-CM | POA: Diagnosis not present

## 2014-02-10 DIAGNOSIS — I4891 Unspecified atrial fibrillation: Secondary | ICD-10-CM | POA: Diagnosis not present

## 2014-02-10 DIAGNOSIS — I739 Peripheral vascular disease, unspecified: Secondary | ICD-10-CM | POA: Diagnosis not present

## 2014-02-10 DIAGNOSIS — Z48815 Encounter for surgical aftercare following surgery on the digestive system: Secondary | ICD-10-CM | POA: Diagnosis not present

## 2014-02-10 DIAGNOSIS — I2510 Atherosclerotic heart disease of native coronary artery without angina pectoris: Secondary | ICD-10-CM | POA: Diagnosis not present

## 2014-02-21 ENCOUNTER — Other Ambulatory Visit (HOSPITAL_COMMUNITY): Payer: Self-pay | Admitting: Cardiovascular Disease

## 2014-02-22 ENCOUNTER — Encounter: Payer: Self-pay | Admitting: Vascular Surgery

## 2014-02-22 NOTE — Telephone Encounter (Signed)
Rx was sent to pharmacy electronically. 

## 2014-02-23 ENCOUNTER — Encounter: Payer: Self-pay | Admitting: Vascular Surgery

## 2014-02-23 ENCOUNTER — Ambulatory Visit (INDEPENDENT_AMBULATORY_CARE_PROVIDER_SITE_OTHER): Payer: Medicare Other | Admitting: Vascular Surgery

## 2014-02-23 VITALS — BP 145/48 | HR 68 | Temp 98.3°F | Resp 14 | Ht 64.0 in | Wt 144.0 lb

## 2014-02-23 DIAGNOSIS — I771 Stricture of artery: Secondary | ICD-10-CM

## 2014-02-23 DIAGNOSIS — K551 Chronic vascular disorders of intestine: Secondary | ICD-10-CM

## 2014-02-23 DIAGNOSIS — Z48812 Encounter for surgical aftercare following surgery on the circulatory system: Secondary | ICD-10-CM

## 2014-02-23 NOTE — Progress Notes (Signed)
   Patient name: Traci [name] MRN: [id number] DOB: December 23, 1945 Sex: female  REASON FOR VISIT: Follow up after SMA bypass  HPI: Traci [name] is a 68 y.o. female who had presented with postprandial abdominal pain and weight loss. 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 She was not a candidate for an endovascular approach to this and aorto SMA bypass was recommended. She underwent exploratory laparotomy, lysis of adhesions, and aorto to SMA bypass with a 6 mm Dacron graft on 01/27/2014. She was discharged on 02/04/2014. She comes in for her first outpatient visit.  She no longer has postprandial abdominal pain. She states that she was 150 pounds when she left the hospital and she's done 144 but I suspect this is from poor appetite related to her major abdominal surgery. She has no other specific complaints except for some rib pain I suspect related to retraction. She denies fever or chills.   REVIEW OF SYSTEMS: Valu.Nieves ] denotes positive finding; [  ] denotes negative finding  CARDIOVASCULAR:  [ ]  chest pain   [ ]  dyspnea on exertion    CONSTITUTIONAL:  [ ]  fever   [ ]  chills  PHYSICAL EXAM: Filed Vitals:   02/23/14 1417  BP: 145/48  Pulse: 68  Temp: 98.3 F (36.8 C)  TempSrc: Oral  Resp: 14  Height: 5\' 4"  (1.626 m)  Weight: 144 lb (65.318 kg)  SpO2: 99%   Body mass index is 24.71 kg/(m^2). GENERAL: The patient is a well-nourished female, in no acute distress. The vital signs are documented above. CARDIOVASCULAR: There is a regular rate and rhythm. PULMONARY: There is good air exchange bilaterally without wheezing or rales. Her abdomen is soft and nontender. Her incision is healing nicely. She has palpable femoral pulses.  MEDICAL ISSUES:  Chronic mesenteric ischemia The patient is doing well status post aorto to SMA bypass. She is not a smoker. She is on aspirin. She is on  a statin. I've encouraged her to stay as active as possible. I'll see her back in 6 months. She knows to call sooner she has problems.   [facility]  [place]: [phone]

## 2014-02-23 NOTE — Assessment & Plan Note (Signed)
The patient is doing well status post aorto to SMA bypass. She is not a smoker. She is on aspirin. She is on a statin. I've encouraged her to stay as active as possible. I'll see her back in 6 months. She knows to call sooner she has problems.

## 2014-03-01 ENCOUNTER — Encounter (INDEPENDENT_AMBULATORY_CARE_PROVIDER_SITE_OTHER): Payer: Self-pay

## 2014-03-25 ENCOUNTER — Other Ambulatory Visit: Payer: Self-pay

## 2014-04-01 ENCOUNTER — Other Ambulatory Visit: Payer: Self-pay

## 2014-04-01 NOTE — Telephone Encounter (Signed)
Please refer to PCP 

## 2014-04-01 NOTE — Telephone Encounter (Deleted)
Rx was sent to pharmacy electronically. 

## 2014-04-13 ENCOUNTER — Other Ambulatory Visit (HOSPITAL_COMMUNITY): Payer: Self-pay | Admitting: Cardiovascular Disease

## 2014-04-13 NOTE — Telephone Encounter (Signed)
Rx was sent to pharmacy electronically. 

## 2014-04-17 DIAGNOSIS — R05 Cough: Secondary | ICD-10-CM | POA: Diagnosis not present

## 2014-04-17 DIAGNOSIS — J069 Acute upper respiratory infection, unspecified: Secondary | ICD-10-CM | POA: Diagnosis not present

## 2014-04-17 DIAGNOSIS — J40 Bronchitis, not specified as acute or chronic: Secondary | ICD-10-CM | POA: Diagnosis not present

## 2014-04-17 DIAGNOSIS — R509 Fever, unspecified: Secondary | ICD-10-CM | POA: Diagnosis not present

## 2014-04-28 ENCOUNTER — Other Ambulatory Visit: Payer: Self-pay | Admitting: *Deleted

## 2014-04-28 MED ORDER — ATORVASTATIN CALCIUM 10 MG PO TABS: 10.0000 mg | ORAL_TABLET | Freq: Every evening | ORAL | Status: DC

## 2014-04-28 MED ORDER — CLOPIDOGREL BISULFATE 75 MG PO TABS: 75.0000 mg | ORAL_TABLET | Freq: Every day | ORAL | Status: DC

## 2014-04-28 NOTE — Telephone Encounter (Signed)
Refilled electronically 

## 2014-05-26 ENCOUNTER — Encounter (HOSPITAL_COMMUNITY): Payer: Self-pay | Admitting: Vascular Surgery

## 2014-05-28 DIAGNOSIS — J069 Acute upper respiratory infection, unspecified: Secondary | ICD-10-CM | POA: Diagnosis not present

## 2014-05-28 DIAGNOSIS — R509 Fever, unspecified: Secondary | ICD-10-CM | POA: Diagnosis not present

## 2014-06-30 DIAGNOSIS — K219 Gastro-esophageal reflux disease without esophagitis: Secondary | ICD-10-CM | POA: Diagnosis not present

## 2014-06-30 DIAGNOSIS — E785 Hyperlipidemia, unspecified: Secondary | ICD-10-CM | POA: Diagnosis not present

## 2014-06-30 DIAGNOSIS — I10 Essential (primary) hypertension: Secondary | ICD-10-CM | POA: Diagnosis not present

## 2014-07-26 ENCOUNTER — Encounter: Payer: Self-pay | Admitting: Family Medicine

## 2014-07-26 ENCOUNTER — Ambulatory Visit (INDEPENDENT_AMBULATORY_CARE_PROVIDER_SITE_OTHER): Payer: Medicare Other | Admitting: Family Medicine

## 2014-07-26 VITALS — BP 126/44 | HR 61 | Ht 64.0 in | Wt 148.0 lb

## 2014-07-26 DIAGNOSIS — K551 Chronic vascular disorders of intestine: Secondary | ICD-10-CM

## 2014-07-26 DIAGNOSIS — I2510 Atherosclerotic heart disease of native coronary artery without angina pectoris: Secondary | ICD-10-CM | POA: Diagnosis not present

## 2014-07-26 DIAGNOSIS — I739 Peripheral vascular disease, unspecified: Secondary | ICD-10-CM

## 2014-07-26 DIAGNOSIS — I771 Stricture of artery: Secondary | ICD-10-CM | POA: Diagnosis not present

## 2014-07-26 DIAGNOSIS — I499 Cardiac arrhythmia, unspecified: Secondary | ICD-10-CM | POA: Diagnosis not present

## 2014-07-26 DIAGNOSIS — E785 Hyperlipidemia, unspecified: Secondary | ICD-10-CM | POA: Diagnosis not present

## 2014-07-26 DIAGNOSIS — I10 Essential (primary) hypertension: Secondary | ICD-10-CM

## 2014-07-26 NOTE — Progress Notes (Signed)
CC: Traci [name] is a 69 y.o. female is here for Establish Care   Subjective: HPI:  Very pleasant 69 year old here to establish care  Reports a history of essential hypertension currently taking atenolol 50 mg every morning and Atacand 8 mg every evening. No outside blood pressures report. She's noticed that ever since her hospitalization in August of last year her diastolic is usually in the 40s or 50s. There is been no lightheadedness nor syncopal since this was noted, prior to this it was usually about 70. There were no blood pressure medication adjustments  That occurred during or after that hospitalization to her knowledge.  She has a history of peripheral arterial disease and has never had claudication, limb pallor, nor peripheral thrombus. She has a history of subclavian arterial stenting on the left twice however she tells me that both times the stenting " collapsed" and she eventually received a bypass from the carotid artery. She tells me she sees her vascular surgeon on an annual basis now and receives ultrasounds of the carotid arteries and what sounds to be ABIs, this is coming up in the spring. She tells me that she's been told she has a bruit in her carotid arteries but she is not sure how long it's been there. It sounds like she's had an ultrasound of her carotid artery since this bruit has been present.    She tells me she also has a history of coronary artery disease however has never required any stenting nor has there been any talk about coronary artery bypass grafting. She denies any exertional chest pain or shortness of breath.  She has a history of hyperlipidemia with her most recent lipid panel being in 2014. Her LDL was 67 at that time and she thinks that she was taking Lipitor at that time. She currently takes 10 mg of this daily without any right upper quadrant pain or myalgias  Early in 2015 she began having abdominal pain that was persistent throughout the  day with no real change in her bowel movements or nausea. Her gastroenterologist at the time suspected that this was due to irritable bowel syndrome and multiple natural and pharmaceutical interventions did not help whatsoever. One morning she woke up with 10 out of 10 pain and called EMS. 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 She had a successful bypass of this occlusion in August and feels like that pain has resolved completely but she does have some minimal soreness at the top of her surgical scar just below the sternum. Symptoms are not present on a daily basis, and go and can be reproduced with pressing on the scar.  She denies abdominal pain other than this.  She tells me that she has a history of atrial fibrillation and was on Coumadin for a few days while Plavix was " getting out of her system" prior to the mesenteric artery bypass described above. She doesn't believe that she is ever been on any other blood thinners other than Plavix and aspirin. She is occasionally symptomatic described as " flip-flop" sensation in her chest that then is followed by rapid heartbeat.  She tells me that since starting on atenolol the frequency of this has decreased however it occur so infrequently she is unable to specify how frequently she gets it.  Review of Systems - General ROS: negative for - chills, fever, night sweats, weight gain or weight loss Ophthalmic ROS: negative for - decreased vision  Psychological ROS: negative for - anxiety or depression ENT ROS: negative for - hearing change, nasal congestion, tinnitus or allergies Hematological and Lymphatic ROS: negative for - bleeding problems, bruising or swollen lymph nodes Breast ROS: negative Respiratory ROS: no cough, shortness of breath, or wheezing Cardiovascular ROS: no chest pain or dyspnea on exertion Gastrointestinal ROS: no abdominal painother than that described above, change in bowel habits,  or black or bloody stools Genito-Urinary ROS: negative for - genital discharge, genital ulcers, incontinence or abnormal bleeding from genitals Musculoskeletal ROS: negative for - joint pain or muscle pain Neurological ROS: negative for - headaches or memory loss Dermatological ROS: negative for lumps, mole changes, rash and skin lesion changes  Past Medical History  Diagnosis Date  . Hypertension 11/30/2010    echo- EF 55%; normal w/ mildly sclerotic aortic valve  . Hyperlipidemia   . Coronary artery disease   . GERD (gastroesophageal reflux disease)   . Osteopenia   . Sigmoid diverticulitis   . Peripheral vascular disease   . Atrial fibrillation   . Nonspecific ST-T wave electrocardiographic changes 03/26/2011    R/Lmv - EF 74%, normal perfusion all regions, ST depression w/ Lexiscan infusion w/o assoc angina  . CAD (coronary artery disease)   . PAD (peripheral artery disease)   . PVD (peripheral vascular disease) 11/05/2011    doppler - R/L brachial pressures essentially equal w/o inflow disease; L sublclavian/CCA bypass graft demonstrates patent flow, no evidence of significant stenosis  . Hypertension 11/05/11    renal dopplers - celiac artery and SMA >50% diameter reduction, R renal artery - mildly elevated velocities 1-59% diameter reduction, L renal artery normal  . Claudication 01/06/2008    Lower extremity dopplers - no evidence of arterial insufficiency, normal exam  . Anxiety     Past Surgical History  Procedure Laterality Date  . Cholecystectomy    . Appendectomy    . Abdominal hysterectomy    . Subclavian artery stents  01/23/2010    Left carotid to subclavian artery Bypass  . Spine surgery  Feb. 2011  . Subclavian artery stent  1995    X's  several  . Subclavian artery stent Left 09/20/2008    LSA ISR 7x58mm Cordis Genesis on Opta premount, reduction from 80% to 0%  . Cardiac catheterization  06/03/2008    60% LAD, involving D2, borderline significant by IVUS, medical  therapy. CFX, RCA OK.  . Eye surgery      retinal surgery  . Abdominal aortic aneurysm repair N/A 01/27/2014    Procedure: AORTO-SUPERIOR MESENTERIC ARTERY BYPASS GRAFT;  Surgeon: Angelia Mould, MD;  Location: [facility];  Service: Vascular;  Laterality: N/A;  . Visceral angiogram  01/17/2014    Procedure: VISCERAL ANGIOGRAM;  Surgeon: Angelia Mould, MD;  Location: [facility] CATH LAB;  Service: Cardiovascular;;   Family History  Problem Relation Age of Onset  . Heart disease Father     Heart Disease before age 77  . Kidney disease Father   . Heart attack Father   . Hyperlipidemia Father   . Hypertension Father   . Diabetes Mother     Varicose Veins  . Diabetes Maternal Grandmother   . Heart disease Paternal Grandfather     History   Social History  . Marital Status: Legally Separated    Spouse Name: N/A    Number of Children: N/A  . Years of Education: N/A   Occupational History  . Not on file.   Social History Main  Topics  . Smoking status: Former Smoker    Types: Cigarettes    Quit date: 09/30/1993  . Smokeless tobacco: Never Used  . Alcohol Use: No  . Drug Use: No  . Sexual Activity: Not on file   Other Topics Concern  . Not on file   Social History Narrative     Objective: BP 126/44 mmHg  Pulse 61  Ht 5\' 4"  (1.626 m)  Wt 148 lb (67.132 kg)  BMI 25.39 kg/m2  General: Alert and Oriented, No Acute Distress HEENT: Pupils equal, round, reactive to light. Conjunctivae clear.  Moist mucous membranes Lungs: Clear to auscultation bilaterally, no wheezing/ronchi/rales.  Comfortable work of breathing. Good air movement. Cardiac: Regular rate and rhythm. Normal S1/S2.  No murmurs, rubs, nor gallops.  Carotid bruits on the left and right Abdomen: Normal bowel sounds, soft no palpable masses. No rebound or rigidity. Mild reproduction of her pain when palpating the superiormost portion of her surgical scar on the midline of the abdomen Extremities: No peripheral  edema.  Strong peripheral pulses.  Mental Status: No depression, anxiety, nor agitation. Skin: Warm and dry.  Assessment & Plan: Traci [name] was seen today for establish care.  Diagnoses and associated orders for this visit:  Peripheral arterial disease  Essential hypertension  Coronary artery disease, non-occlusive, last cath 2009  Hyperlipidemia  Chronic mesenteric ischemia - Ambulatory referral to Gastroenterology  Irregular heartbeat  Subclavian arterial stenosis    Peripheral artery disease: stable, will continue to be managed by her vascular surgeon. Agree with annual ultrasound of the carotid arteries with her bruit on exam today. Agree with Plavix. Essential hypertension: Controlled, I discussed that it be wise to cut back on her Atacand given how low her diastolic blood pressures have been. She like to discuss this with her vascular surgeon next month at a already scheduled appointment before she goes through with this. Coronary artery disease: Continue beta blocker and Lipitor, currently stable. Hyperlipidemia: She is due for another lipid panel, joint decision to wait until the spring when she is going to return for complete physical exam so insurance will pay percent of this continue Lipitor pending results Chronic mesenteric ischemia,Stable: She is requesting a referral to a new gastroenterologist, will refer to [facility] Irregular heartbeat: Spent a good deal of time looking through her chart in the [facility] and I don't see any EKGs, telemetry reports, or cardiology notes ever making a comment about  Atrial fibrillation. It's unclear how this got into her chart but I'm going to conclude that she does not have atrial fibrillation.  60 minutes spent face-to-face during visit today of which at least 50% was counseling or coordinating care regarding: 1. Peripheral arterial disease   2. Essential hypertension   3. Coronary artery disease,  non-occlusive, last cath 2009   4. Hyperlipidemia   5. Chronic mesenteric ischemia   6. Irregular heartbeat   7. Subclavian arterial stenosis       Return for April for Annual Wellness Exam.

## 2014-08-17 ENCOUNTER — Ambulatory Visit (INDEPENDENT_AMBULATORY_CARE_PROVIDER_SITE_OTHER): Payer: Medicare Other | Admitting: Family Medicine

## 2014-08-17 ENCOUNTER — Encounter: Payer: Self-pay | Admitting: Family Medicine

## 2014-08-17 VITALS — BP 121/55 | HR 60 | Temp 97.9°F | Wt 147.0 lb

## 2014-08-17 DIAGNOSIS — B9689 Other specified bacterial agents as the cause of diseases classified elsewhere: Secondary | ICD-10-CM

## 2014-08-17 DIAGNOSIS — I2510 Atherosclerotic heart disease of native coronary artery without angina pectoris: Secondary | ICD-10-CM | POA: Diagnosis not present

## 2014-08-17 DIAGNOSIS — J329 Chronic sinusitis, unspecified: Secondary | ICD-10-CM

## 2014-08-17 DIAGNOSIS — A499 Bacterial infection, unspecified: Secondary | ICD-10-CM | POA: Diagnosis not present

## 2014-08-17 MED ORDER — CEFDINIR 300 MG PO CAPS: 300.0000 mg | ORAL_CAPSULE | Freq: Two times a day (BID) | ORAL | Status: AC

## 2014-08-17 NOTE — Progress Notes (Signed)
CC: Traci [name] is a 69 y.o. female is here for Nasal Congestion   Subjective: HPI:  Subjective fever, chills, night sweats, no objective temperature. Shortness of breath wheezing and facial pressure localized around the eyes. Symptoms have been present since the weekend and came on abruptly now moderate in severity and persistent. Accompanied by dizziness but this resolved after taking meclizine. Face feels full. No interventions other than above. Nothing seems to make symptoms better or worse. The present all hours of the day but not interfere with sleep. Denies any other motor or sensory disturbances. Denies confusion or photophobia. No headache other than that described above   Review Of Systems Outlined In HPI  Past Medical History  Diagnosis Date  . Hypertension 11/30/2010    echo- EF 55%; normal w/ mildly sclerotic aortic valve  . Hyperlipidemia   . Coronary artery disease   . GERD (gastroesophageal reflux disease)   . Osteopenia   . Sigmoid diverticulitis   . Peripheral vascular disease   . Atrial fibrillation   . Nonspecific ST-T wave electrocardiographic changes 03/26/2011    R/Lmv - EF 74%, normal perfusion all regions, ST depression w/ Lexiscan infusion w/o assoc angina  . CAD (coronary artery disease)   . PAD (peripheral artery disease)   . PVD (peripheral vascular disease) 11/05/2011    doppler - R/L brachial pressures essentially equal w/o inflow disease; L sublclavian/CCA bypass graft demonstrates patent flow, no evidence of significant stenosis  . Hypertension 11/05/11    renal dopplers - celiac artery and SMA >50% diameter reduction, R renal artery - mildly elevated velocities 1-59% diameter reduction, L renal artery normal  . Claudication 01/06/2008    Lower extremity dopplers - no evidence of arterial insufficiency, normal exam  . Anxiety     Past Surgical History  Procedure Laterality Date  . Cholecystectomy    . Appendectomy    . Abdominal  hysterectomy    . Subclavian artery stents  01/23/2010    Left carotid to subclavian artery Bypass  . Spine surgery  Feb. 2011  . Subclavian artery stent  1995    X's  several  . Subclavian artery stent Left 09/20/2008    LSA ISR 7x90mm Cordis Genesis on Opta premount, reduction from 80% to 0%  . Cardiac catheterization  06/03/2008    60% LAD, involving D2, borderline significant by IVUS, medical therapy. CFX, RCA OK.  . Eye surgery      retinal surgery  . Abdominal aortic aneurysm repair N/A 01/27/2014    Procedure: AORTO-SUPERIOR MESENTERIC ARTERY BYPASS GRAFT;  Surgeon: Angelia Mould, MD;  Location: [facility];  Service: Vascular;  Laterality: N/A;  . Visceral angiogram  01/17/2014    Procedure: VISCERAL ANGIOGRAM;  Surgeon: Angelia Mould, MD;  Location: [facility] CATH LAB;  Service: Cardiovascular;;   Family History  Problem Relation Age of Onset  . Heart disease Father     Heart Disease before age 78  . Kidney disease Father   . Heart attack Father   . Hyperlipidemia Father   . Hypertension Father   . Diabetes Mother     Varicose Veins  . Diabetes Maternal Grandmother   . Heart disease Paternal Grandfather     History   Social History  . Marital Status: Legally Separated    Spouse Name: N/A  . Number of Children: N/A  . Years of Education: N/A   Occupational History  . Not on file.   Social History Main Topics  .  Smoking status: Former Smoker    Types: Cigarettes    Quit date: 09/30/1993  . Smokeless tobacco: Never Used  . Alcohol Use: No  . Drug Use: No  . Sexual Activity: Not on file   Other Topics Concern  . Not on file   Social History Narrative     Objective: BP 121/55 mmHg  Pulse 60  Temp(Src) 97.9 F (36.6 C) (Oral)  Wt 147 lb (66.679 kg)  General: Alert and Oriented, No Acute Distress HEENT: Pupils equal, round, reactive to light. Conjunctivae clear.  External ears unremarkable, canals clear with intact TMs with appropriate landmarks.  Middle  ear appears open without effusion. Pink inferior turbinates.  Moist mucous membranes, pharynx without lesions other than cobblestoning and postnasal drip .  Neck supple without palpable lymphadenopathy nor abnormal masses. Lungs: Clear to auscultation bilaterally, no wheezing/ronchi/rales.  Comfortable work of breathing. Good air movement. Cardiac: Regular rate and rhythm. Normal S1/S2.  No murmurs, rubs, nor gallops.   Extremities: No peripheral edema.  Strong peripheral pulses.  Mental Status: No depression, anxiety, nor agitation. Skin: Warm and dry.  Assessment & Plan: Traci [name] was seen today for nasal congestion.  Diagnoses and all orders for this visit:  Bacterial sinusitis Orders: -     cefdinir (OMNICEF) 300 MG capsule; Take 1 capsule (300 mg total) by mouth 2 (two) times daily.   Factual sinusitis: She does not tolerate decongestants, urge nasal saline washes and Omnicef. Call if no better by Friday, she does not tolerate prednisone.  Return if symptoms worsen or fail to improve.

## 2014-08-20 ENCOUNTER — Emergency Department (INDEPENDENT_AMBULATORY_CARE_PROVIDER_SITE_OTHER)
Admission: EM | Admit: 2014-08-20 | Discharge: 2014-08-20 | Disposition: A | Discharge status: Home / Self Care | Payer: Medicare Other | Source: Home / Self Care | Attending: Family Medicine | Admitting: Family Medicine

## 2014-08-20 ENCOUNTER — Encounter: Payer: Self-pay | Admitting: Emergency Medicine

## 2014-08-20 DIAGNOSIS — S00412A Abrasion of left ear, initial encounter: Secondary | ICD-10-CM | POA: Diagnosis not present

## 2014-08-20 DIAGNOSIS — H9222 Otorrhagia, left ear: Secondary | ICD-10-CM

## 2014-08-20 DIAGNOSIS — J0100 Acute maxillary sinusitis, unspecified: Secondary | ICD-10-CM

## 2014-08-20 MED ORDER — IPRATROPIUM BROMIDE 0.06 % NA SOLN: 2.0000 | Freq: Four times a day (QID) | NASAL | Status: DC

## 2014-08-20 NOTE — ED Provider Notes (Signed)
Traci [name] is a 69 y.o. female who presents to Urgent Care today for nasal drainage congestion and left ear bleeding. Patient was seen by primary care provider on March 2 for sinusitis and given Omnicef. Today she awoke with bleeding of her left ear. She denies any significant ear pain or decreased hearing. She denies sticking anything in her ear that she is aware of. She notes continued nasal drainage.   Past Medical History  Diagnosis Date  . Hypertension 11/30/2010    echo- EF 55%; normal w/ mildly sclerotic aortic valve  . Hyperlipidemia   . Coronary artery disease   . GERD (gastroesophageal reflux disease)   . Osteopenia   . Sigmoid diverticulitis   . Peripheral vascular disease   . Atrial fibrillation   . Nonspecific ST-T wave electrocardiographic changes 03/26/2011    R/Lmv - EF 74%, normal perfusion all regions, ST depression w/ Lexiscan infusion w/o assoc angina  . CAD (coronary artery disease)   . PAD (peripheral artery disease)   . PVD (peripheral vascular disease) 11/05/2011    doppler - R/L brachial pressures essentially equal w/o inflow disease; L sublclavian/CCA bypass graft demonstrates patent flow, no evidence of significant stenosis  . Hypertension 11/05/11    renal dopplers - celiac artery and SMA >50% diameter reduction, R renal artery - mildly elevated velocities 1-59% diameter reduction, L renal artery normal  . Claudication 01/06/2008    Lower extremity dopplers - no evidence of arterial insufficiency, normal exam  . Anxiety    Past Surgical History  Procedure Laterality Date  . Cholecystectomy    . Appendectomy    . Abdominal hysterectomy    . Subclavian artery stents  01/23/2010    Left carotid to subclavian artery Bypass  . Spine surgery  Feb. 2011  . Subclavian artery stent  1995    X's  several  . Subclavian artery stent Left 09/20/2008    LSA ISR 7x58mm Cordis Genesis on Opta premount, reduction from 80% to 0%  . Cardiac catheterization   06/03/2008    60% LAD, involving D2, borderline significant by IVUS, medical therapy. CFX, RCA OK.  . Eye surgery      retinal surgery  . Abdominal aortic aneurysm repair N/A 01/27/2014    Procedure: AORTO-SUPERIOR MESENTERIC ARTERY BYPASS GRAFT;  Surgeon: Angelia Mould, MD;  Location: [place];  Service: Vascular;  Laterality: N/A;  . Visceral angiogram  01/17/2014    Procedure: VISCERAL ANGIOGRAM;  Surgeon: Angelia Mould, MD;  Location: [facility] CATH LAB;  Service: Cardiovascular;;   History  Substance Use Topics  . Smoking status: Former Smoker    Types: Cigarettes    Quit date: 09/30/1993  . Smokeless tobacco: Never Used  . Alcohol Use: No   ROS as above Medications: No current facility-administered medications for this encounter.   Current Outpatient Prescriptions  Medication Sig Dispense Refill  . aspirin EC 81 MG tablet Take 81 mg by mouth daily.    Marland Kitchen atenolol (TENORMIN) 25 MG tablet Take 1 tablet (25 mg total) by mouth daily. (Patient taking differently: Take 25 mg by mouth. ) 30 tablet 11  . atorvastatin (LIPITOR) 10 MG tablet Take 1 tablet (10 mg total) by mouth every evening. 30 tablet 10  . candesartan (ATACAND) 16 MG tablet take 1/2 tablet by mouth every morning 15 tablet 8  . cefdinir (OMNICEF) 300 MG capsule Take 1 capsule (300 mg total) by mouth 2 (two) times daily. 20 capsule 0  . cholecalciferol (VITAMIN  D) 1000 UNITS tablet Take 1,000 Units by mouth daily.    . clopidogrel (PLAVIX) 75 MG tablet Take 1 tablet (75 mg total) by mouth daily. 30 tablet 10  . clorazepate (TRANXENE) 7.5 MG tablet Take 1 tablet (7.5 mg total) by mouth daily. 30 tablet 3  . EPINEPHrine (EPIPEN 2-PAK) 0.3 mg/0.3 mL DEVI Inject 0.3 mg into the muscle as needed (for allergic reaction).    Marland Kitchen estradiol (ESTRACE) 0.5 MG tablet Take 0.5 mg by mouth daily.    . fexofenadine (ALLEGRA) 180 MG tablet Take 180 mg by mouth daily.    . furosemide (LASIX) 20 MG tablet Take 1 tablet (20 mg total) by  mouth every other day. 45 tablet 2  . ipratropium (ATROVENT) 0.06 % nasal spray Place 2 sprays into both nostrils 4 (four) times daily. 15 mL 1  . meclizine (ANTIVERT) 25 MG tablet Take 12.5 mg by mouth 3 (three) times daily as needed.     . Multiple Vitamin (MULTIVITAMIN WITH MINERALS) TABS Take 1 tablet by mouth daily.    Marland Kitchen zolpidem (AMBIEN) 10 MG tablet Take 10 mg by mouth at bedtime as needed for sleep.     Allergies  Allergen Reactions  . Cortisone Anaphylaxis  . Dilaudid [Hydromorphone Hcl] Nausea And Vomiting    Pt states she will start vomiting immediately for 6 hours  . Iodine Anaphylaxis  . Medrol [Methylprednisolone] Anaphylaxis  . Omnipaque [Iohexol] Anaphylaxis  . Prednisone Anaphylaxis  . Shellfish Allergy Anaphylaxis  . Sulfa Drugs Cross Reactors Anaphylaxis  . Augmentin [Amoxicillin-Pot Clavulanate]     Upset stomach  . Morphine And Related     nausea  . Codeine Rash  . Erythromycin Rash     Exam:  BP 122/77 mmHg  Pulse 60  Temp(Src) 97.7 F (36.5 C) (Oral)  Resp 16  Ht 5\' 4"  (1.626 m)  Wt 147 lb (66.679 kg)  BMI 25.22 kg/m2  SpO2 99% Gen: Well NAD HEENT: EOMI,  MMM tympanic membranes are normal appearing bilaterally. The left ear canal is present with a small abrasion and dried blood. The posterior pharynx with cobblestoning. Clear nasal drainage present  Lungs: Normal work of breathing. CTABL Heart: RRR no MRG Abd: NABS, Soft. Nondistended, Nontender Exts: Brisk capillary refill, warm and well perfused.   No results found for this or any previous visit (from the past 24 hour(s)). No results found.  Assessment and Plan: 69 y.o. female with resolving sinusitis. Treat runny nose with Atrovent nasal spray. The bleeding of the left ear is due to abrasion of the ear canal. Return as needed.  Discussed warning signs or symptoms. Please see discharge instructions. Patient expresses understanding.     Gregor Hams, MD 08/20/14 [phone]

## 2014-08-20 NOTE — Discharge Instructions (Signed)
Thank you for coming in today. Call or go to the emergency room if you get worse, have trouble breathing, have chest pains, or palpitations.    Abrasion An abrasion is a cut or scrape of the skin. Abrasions do not extend through all layers of the skin and most heal within 10 days. It is important to care for your abrasion properly to prevent infection. CAUSES  Most abrasions are caused by falling on, or gliding across, the ground or other surface. When your skin rubs on something, the outer and inner layer of skin rubs off, causing an abrasion. DIAGNOSIS  Your caregiver will be able to diagnose an abrasion during a physical exam.  TREATMENT  Your treatment depends on how large and deep the abrasion is. Generally, your abrasion will be cleaned with water and a mild soap to remove any dirt or debris. An antibiotic ointment may be put over the abrasion to prevent an infection. A bandage (dressing) may be wrapped around the abrasion to keep it from getting dirty.  You may need a tetanus shot if:  You cannot remember when you had your last tetanus shot.  You have never had a tetanus shot.  The injury broke your skin. If you get a tetanus shot, your arm may swell, get red, and feel warm to the touch. This is common and not a problem. If you need a tetanus shot and you choose not to have one, there is a rare chance of getting tetanus. Sickness from tetanus can be serious.  HOME CARE INSTRUCTIONS   If a dressing was applied, change it at least once a day or as directed by your caregiver. If the bandage sticks, soak it off with warm water.   Wash the area with water and a mild soap to remove all the ointment 2 times a day. Rinse off the soap and pat the area dry with a clean towel.   Reapply any ointment as directed by your caregiver. This will help prevent infection and keep the bandage from sticking. Use gauze over the wound and under the dressing to help keep the bandage from sticking.    Change your dressing right away if it becomes wet or dirty.   Only take over-the-counter or prescription medicines for pain, discomfort, or fever as directed by your caregiver.   Follow up with your caregiver within 24-48 hours for a wound check, or as directed. If you were not given a wound-check appointment, look closely at your abrasion for redness, swelling, or pus. These are signs of infection. SEEK IMMEDIATE MEDICAL CARE IF:   You have increasing pain in the wound.   You have redness, swelling, or tenderness around the wound.   You have pus coming from the wound.   You have a fever or persistent symptoms for more than 2-3 days.  You have a fever and your symptoms suddenly get worse.  You have a bad smell coming from the wound or dressing.  MAKE SURE YOU:   Understand these instructions.  Will watch your condition.  Will get help right away if you are not doing well or get worse. Document Released: 03/13/2005 Document Revised: 05/20/2012 Document Reviewed: 05/07/2011 [facility] Patient Information 2015 [place], [place]. This information is not intended to replace advice given to you by your health care provider. Make sure you discuss any questions you have with your health care provider.

## 2014-08-20 NOTE — ED Notes (Signed)
Patient states was seen by Dr.Hommel on 08-17-2014 for diagnosis sinus and ear infection with fluid seen behind right TM. Awoke this morning with blood on the pillow from left ear. Denies pain. Thinks she might have some diminished hearing in right ear.

## 2014-08-23 ENCOUNTER — Encounter: Payer: Self-pay | Admitting: Vascular Surgery

## 2014-08-24 ENCOUNTER — Ambulatory Visit (INDEPENDENT_AMBULATORY_CARE_PROVIDER_SITE_OTHER): Payer: Medicare Other | Admitting: Vascular Surgery

## 2014-08-24 ENCOUNTER — Encounter: Payer: Self-pay | Admitting: Vascular Surgery

## 2014-08-24 VITALS — BP 118/48 | HR 66 | Temp 97.9°F | Resp 16 | Ht 64.0 in | Wt 148.0 lb

## 2014-08-24 DIAGNOSIS — K551 Chronic vascular disorders of intestine: Secondary | ICD-10-CM | POA: Diagnosis not present

## 2014-08-24 DIAGNOSIS — I2510 Atherosclerotic heart disease of native coronary artery without angina pectoris: Secondary | ICD-10-CM | POA: Diagnosis not present

## 2014-08-24 NOTE — Progress Notes (Signed)
[facility]  Patient name: Traci [name] MRN: [id number] DOB: 08-Jan-1946 Sex: female  REASON FOR VISIT: Follow up after aorto to superior mesenteric artery bypass with 6 mm Dacron graft.  HPI: Traci [name] is a 69 y.o. female who had presented with postprandial abdominal pain and weight loss. 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 She was not a candidate for an endovascular approach to this and underwent exploratory laparotomy and lysis of adhesions and an aorto to superior mesenteric artery bypass with a 6 mm Dacron graft on 01/27/2014. She returns for a routine follow up visit.  I last saw her on 02/23/2014. She was no longer having postprandial abdominal pain. She was not yet gaining weight at that time. At that time she had quit smoking. She was on aspirin and was on a statin. She was scheduled for a 6 month follow up visit. Since I saw her last she has gained 4 pounds. She denies any history of postprandial abdominal pain. She denies any claudication or rest pain. She denies any history of stroke, TIAs, expressive or receptive aphasia, or amaurosis fugax.   Past Medical History  Diagnosis Date  . Hypertension 11/30/2010    echo- EF 55%; normal w/ mildly sclerotic aortic valve  . Hyperlipidemia   . Coronary artery disease   . GERD (gastroesophageal reflux disease)   . Osteopenia   . Sigmoid diverticulitis   . Peripheral vascular disease   . Atrial fibrillation   . Nonspecific ST-T wave electrocardiographic changes 03/26/2011    R/Lmv - EF 74%, normal perfusion all regions, ST depression w/ Lexiscan infusion w/o assoc angina  . CAD (coronary artery disease)   . PAD (peripheral artery disease)   . PVD (peripheral vascular disease) 11/05/2011    doppler - R/L brachial pressures essentially equal w/o inflow disease; L sublclavian/CCA bypass  graft demonstrates patent flow, no evidence of significant stenosis  . Hypertension 11/05/11    renal dopplers - celiac artery and SMA >50% diameter reduction, R renal artery - mildly elevated velocities 1-59% diameter reduction, L renal artery normal  . Claudication 01/06/2008    Lower extremity dopplers - no evidence of arterial insufficiency, normal exam  . Anxiety    Family History  Problem Relation Age of Onset  . Heart disease Father     Heart Disease before age 52  . Kidney disease Father   . Heart attack Father   . Hyperlipidemia Father   . Hypertension Father   . Diabetes Mother     Varicose Veins  . Diabetes Maternal Grandmother   . Heart disease Paternal Grandfather    SOCIAL HISTORY: History  Substance Use Topics  . Smoking status: Former Smoker    Types: Cigarettes    Quit date: 09/30/1993  . Smokeless tobacco: Never Used  . Alcohol Use: No   Allergies  Allergen Reactions  . Cortisone Anaphylaxis  . Dilaudid [Hydromorphone Hcl] Nausea And Vomiting    Pt states she will start vomiting immediately for 6 hours  . Iodine Anaphylaxis  . Medrol [Methylprednisolone] Anaphylaxis  . Omnipaque [Iohexol] Anaphylaxis  . Prednisone Anaphylaxis  . Shellfish Allergy Anaphylaxis  . Sulfa Drugs Cross Reactors Anaphylaxis  . Augmentin [Amoxicillin-Pot Clavulanate]     Upset stomach  . Morphine And Related     nausea  . Codeine Rash  . Erythromycin Rash   Current Outpatient Prescriptions  Medication  Sig Dispense Refill  . aspirin EC 81 MG tablet Take 81 mg by mouth daily.    Marland Kitchen atenolol (TENORMIN) 25 MG tablet Take 1 tablet (25 mg total) by mouth daily. (Patient taking differently: Take 25 mg by mouth. ) 30 tablet 11  . atorvastatin (LIPITOR) 10 MG tablet Take 1 tablet (10 mg total) by mouth every evening. 30 tablet 10  . candesartan (ATACAND) 16 MG tablet take 1/2 tablet by mouth every morning 15 tablet 8  . cefdinir (OMNICEF) 300 MG capsule Take 1 capsule (300 mg total)  by mouth 2 (two) times daily. 20 capsule 0  . cholecalciferol (VITAMIN D) 1000 UNITS tablet Take 1,000 Units by mouth daily.    . clopidogrel (PLAVIX) 75 MG tablet Take 1 tablet (75 mg total) by mouth daily. 30 tablet 10  . clorazepate (TRANXENE) 7.5 MG tablet Take 1 tablet (7.5 mg total) by mouth daily. 30 tablet 3  . EPINEPHrine (EPIPEN 2-PAK) 0.3 mg/0.3 mL DEVI Inject 0.3 mg into the muscle as needed (for allergic reaction).    Marland Kitchen estradiol (ESTRACE) 0.5 MG tablet Take 0.5 mg by mouth daily.    . fexofenadine (ALLEGRA) 180 MG tablet Take 180 mg by mouth daily.    . furosemide (LASIX) 20 MG tablet Take 1 tablet (20 mg total) by mouth every other day. 45 tablet 2  . ipratropium (ATROVENT) 0.06 % nasal spray Place 2 sprays into both nostrils 4 (four) times daily. 15 mL 1  . meclizine (ANTIVERT) 25 MG tablet Take 12.5 mg by mouth 3 (three) times daily as needed.     . Multiple Vitamin (MULTIVITAMIN WITH MINERALS) TABS Take 1 tablet by mouth daily.    Marland Kitchen zolpidem (AMBIEN) 10 MG tablet Take 10 mg by mouth at bedtime as needed for sleep.     No current facility-administered medications for this visit.   REVIEW OF SYSTEMS: Valu.Nieves ] denotes positive finding; [  ] denotes negative finding  CARDIOVASCULAR:  [ ]  chest pain   [ ]  chest pressure   Valu.Nieves ] palpitations   [ ]  orthopnea   [ ]  dyspnea on exertion   [ ]  claudication   [ ]  rest pain   [ ]  DVT   [ ]  phlebitis PULMONARY:   [ ]  productive cough   [ ]  asthma   [ ]  wheezing NEUROLOGIC:   Valu.Nieves ] weakness  [ ]  paresthesias  [ ]  aphasia  [ ]  amaurosis  [ ]  dizziness HEMATOLOGIC:   [ ]  bleeding problems   [ ]  clotting disorders MUSCULOSKELETAL:  [ ]  joint pain   [ ]  joint swelling [ ]  leg swelling GASTROINTESTINAL: [ ]   blood in stool  [ ]   hematemesis GENITOURINARY:  [ ]   dysuria  [ ]   hematuria PSYCHIATRIC:  [ ]  history of major depression INTEGUMENTARY:  [ ]  rashes  [ ]  ulcers CONSTITUTIONAL:  [ ]  fever   [ ]  chills  PHYSICAL EXAM: Filed Vitals:    08/24/14 1336 08/24/14 1340  BP: 124/49 118/48  Pulse: 62 66  Temp: 97.9 F (36.6 C)   TempSrc: Oral   Resp: 16   Height: 5\' 4"  (1.626 m)   Weight: 148 lb (67.132 kg)   SpO2: 97%    Body mass index is 25.39 kg/(m^2). GENERAL: The patient is a well-nourished female, in no acute distress. The vital signs are documented above. CARDIOVASCULAR: There is a regular rate and rhythm. She has bilateral carotid bruits. She has palpable femoral  pulses and palpable posterior tibial pulses bilaterally. PULMONARY: There is good air exchange bilaterally without wheezing or rales. ABDOMEN: Soft and non-tender with normal pitched bowel sounds.  MUSCULOSKELETAL: There are no major deformities or cyanosis. NEUROLOGIC: No focal weakness or paresthesias are detected. SKIN: There are no ulcers or rashes noted. PSYCHIATRIC: The patient has a normal affect.  DATA:  She had a carotid duplex scan in June at the [facility]. This showed no evidence of significant carotid disease. She has a known left carotid subclavian bypass.  MEDICAL ISSUES: CHRONIC MESENTERIC ISCHEMIA: The patient is doing well status post aorto SMA bypass. She has gained weight and is no longer having postprandial abdominal pain. She is not a smoker. He is on aspirin. She is on a statin. I have encouraged her to stay as active as possible. We have discussed the importance of nutrition. I plan on seeing her back in 1 year. She knows to call sooner if she has problems. Her carotid disease is followed by her cardiologist.   Return in about 1 year (around 08/24/2015).   [place] [place] Beeper: [phone]

## 2014-08-26 DIAGNOSIS — K5909 Other constipation: Secondary | ICD-10-CM | POA: Diagnosis not present

## 2014-08-26 DIAGNOSIS — K551 Chronic vascular disorders of intestine: Secondary | ICD-10-CM | POA: Diagnosis not present

## 2014-08-26 DIAGNOSIS — K219 Gastro-esophageal reflux disease without esophagitis: Secondary | ICD-10-CM | POA: Diagnosis not present

## 2014-09-10 ENCOUNTER — Emergency Department (INDEPENDENT_AMBULATORY_CARE_PROVIDER_SITE_OTHER)
Admission: EM | Admit: 2014-09-10 | Discharge: 2014-09-10 | Disposition: A | Discharge status: Home / Self Care | Payer: Medicare Other | Source: Home / Self Care | Attending: Family Medicine | Admitting: Family Medicine

## 2014-09-10 DIAGNOSIS — J111 Influenza due to unidentified influenza virus with other respiratory manifestations: Secondary | ICD-10-CM

## 2014-09-10 DIAGNOSIS — R69 Illness, unspecified: Principal | ICD-10-CM

## 2014-09-10 MED ORDER — OSELTAMIVIR PHOSPHATE 75 MG PO CAPS: 75.0000 mg | ORAL_CAPSULE | Freq: Two times a day (BID) | ORAL | Status: DC

## 2014-09-10 NOTE — Discharge Instructions (Signed)
If increasing cold-like symptoms develop, try the following: Take plain guaifenesin (1200mg  extended release tabs such as Mucinex) twice daily, with plenty of water, for cough and congestion.  Get adequate rest.   May use Afrin nasal spray (or generic oxymetazoline) twice daily for about 5 days.  Also recommend using saline nasal spray several times daily and saline nasal irrigation (AYR is a common brand). Try warm salt water gargles for sore throat.  Stop all antihistamines for now, and other non-prescription cough/cold preparations. May take Tylenol for fever, headache, etc. May take Delsym Cough Suppressant at bedtime for nighttime cough.   Follow-up with family doctor if not improving about 5 days.

## 2014-09-10 NOTE — ED Notes (Signed)
Patient received the Prevnar 13 injection, she states that soon after she started to get chills and ran a fever, site is red and irritated, she also has a cough.

## 2014-09-10 NOTE — ED Provider Notes (Signed)
CSN: [id number]     Arrival date & time 09/10/14  0932 History   First MD Initiated Contact with Patient 09/10/14 [phone]     Chief Complaint  Patient presents with  . Arm Pain    left      HPI Comments: Complains of 1 day history flu-like illness including myalgias, headache, fever/chills, fatigue, and sinus congestion but no cough.  No pleuritic pain but complains of occasional shortness of breath.      The history is provided by the patient.    Past Medical History  Diagnosis Date  . Hypertension 11/30/2010    echo- EF 55%; normal w/ mildly sclerotic aortic valve  . Hyperlipidemia   . Coronary artery disease   . GERD (gastroesophageal reflux disease)   . Osteopenia   . Sigmoid diverticulitis   . Peripheral vascular disease   . Atrial fibrillation   . Nonspecific ST-T wave electrocardiographic changes 03/26/2011    R/Lmv - EF 74%, normal perfusion all regions, ST depression w/ Lexiscan infusion w/o assoc angina  . CAD (coronary artery disease)   . PAD (peripheral artery disease)   . PVD (peripheral vascular disease) 11/05/2011    doppler - R/L brachial pressures essentially equal w/o inflow disease; L sublclavian/CCA bypass graft demonstrates patent flow, no evidence of significant stenosis  . Hypertension 11/05/11    renal dopplers - celiac artery and SMA >50% diameter reduction, R renal artery - mildly elevated velocities 1-59% diameter reduction, L renal artery normal  . Claudication 01/06/2008    Lower extremity dopplers - no evidence of arterial insufficiency, normal exam  . Anxiety    Past Surgical History  Procedure Laterality Date  . Cholecystectomy    . Appendectomy    . Abdominal hysterectomy    . Subclavian artery stents  01/23/2010    Left carotid to subclavian artery Bypass  . Spine surgery  Feb. 2011  . Subclavian artery stent  1995    X's  several  . Subclavian artery stent Left 09/20/2008    LSA ISR 7x104mm Cordis Genesis on Opta premount, reduction from 80% to 0%   . Cardiac catheterization  06/03/2008    60% LAD, involving D2, borderline significant by IVUS, medical therapy. CFX, RCA OK.  . Eye surgery      retinal surgery  . Abdominal aortic aneurysm repair N/A 01/27/2014    Procedure: AORTO-SUPERIOR MESENTERIC ARTERY BYPASS GRAFT;  Surgeon: Angelia Mould, MD;  Location: [facility];  Service: Vascular;  Laterality: N/A;  . Visceral angiogram  01/17/2014    Procedure: VISCERAL ANGIOGRAM;  Surgeon: Angelia Mould, MD;  Location: [facility] CATH LAB;  Service: Cardiovascular;;   Family History  Problem Relation Age of Onset  . Heart disease Father     Heart Disease before age 76  . Kidney disease Father   . Heart attack Father   . Hyperlipidemia Father   . Hypertension Father   . Diabetes Mother     Varicose Veins  . Diabetes Maternal Grandmother   . Heart disease Paternal Grandfather    History  Substance Use Topics  . Smoking status: Former Smoker    Types: Cigarettes    Quit date: 09/30/1993  . Smokeless tobacco: Never Used  . Alcohol Use: No   OB History    No data available     Review of Systems + sore throat No cough No pleuritic pain No wheezing + nasal congestion + post-nasal drainage No sinus pain/pressure No itchy/red eyes ? earache  No hemoptysis + SOB + fever, + chills No nausea No vomiting No abdominal pain No diarrhea No urinary symptoms No skin rash + fatigue + myalgias + headache Used OTC meds without relief  Allergies  Cortisone; Dilaudid; Iodine; Medrol; Omnipaque; Prednisone; Shellfish allergy; Sulfa drugs cross reactors; Augmentin; Morphine and related; Codeine; and Erythromycin  Home Medications   Prior to Admission medications   Medication Sig Start Date End Date Taking? Authorizing Provider  aspirin EC 81 MG tablet Take 81 mg by mouth daily.   Yes Historical Provider, MD  atenolol (TENORMIN) 25 MG tablet Take 1 tablet (25 mg total) by mouth daily. Patient taking differently: Take 25 mg by  mouth.  12/13/13  Yes Mihai Croitoru, MD  atorvastatin (LIPITOR) 10 MG tablet Take 1 tablet (10 mg total) by mouth every evening. 04/28/14  Yes Mihai Croitoru, MD  candesartan (ATACAND) 16 MG tablet take 1/2 tablet by mouth every morning 04/13/14  Yes Mihai Croitoru, MD  cholecalciferol (VITAMIN D) 1000 UNITS tablet Take 1,000 Units by mouth daily.   Yes Historical Provider, MD  clopidogrel (PLAVIX) 75 MG tablet Take 1 tablet (75 mg total) by mouth daily. 04/28/14  Yes Mihai Croitoru, MD  clorazepate (TRANXENE) 7.5 MG tablet Take 1 tablet (7.5 mg total) by mouth daily. 11/20/12  Yes Terance Ice, MD  EPINEPHrine (EPIPEN 2-PAK) 0.3 mg/0.3 mL DEVI Inject 0.3 mg into the muscle as needed (for allergic reaction).   Yes Historical Provider, MD  estradiol (ESTRACE) 0.5 MG tablet Take 0.5 mg by mouth daily.   Yes Historical Provider, MD  fexofenadine (ALLEGRA) 180 MG tablet Take 180 mg by mouth daily.   Yes Historical Provider, MD  furosemide (LASIX) 20 MG tablet Take 1 tablet (20 mg total) by mouth every other day. 02/22/14  Yes Mihai Croitoru, MD  ipratropium (ATROVENT) 0.06 % nasal spray Place 2 sprays into both nostrils 4 (four) times daily. 08/20/14  Yes Gregor Hams, MD  meclizine (ANTIVERT) 25 MG tablet Take 12.5 mg by mouth 3 (three) times daily as needed.    Yes Historical Provider, MD  Multiple Vitamin (MULTIVITAMIN WITH MINERALS) TABS Take 1 tablet by mouth daily.   Yes Historical Provider, MD  zolpidem (AMBIEN) 10 MG tablet Take 10 mg by mouth at bedtime as needed for sleep.   Yes Historical Provider, MD  oseltamivir (TAMIFLU) 75 MG capsule Take 1 capsule (75 mg total) by mouth every 12 (twelve) hours. 09/10/14   Kandra Nicolas, MD   BP 124/74 mmHg  Pulse 63  Temp(Src) 97.8 F (36.6 C) (Oral)  Ht 5\' 4"  (1.626 m)  Wt 145 lb (65.772 kg)  BMI 24.88 kg/m2  SpO2 100% Physical Exam Nursing notes and Vital Signs reviewed. Appearance:  Patient appears stated age, and in no acute distress Eyes:   Pupils are equal, round, and reactive to light and accomodation.  Extraocular movement is intact.  Conjunctivae are not inflamed  Ears:  Canals normal.  Tympanic membranes normal.  Nose:  Mildly congested turbinates.  No sinus tenderness.   Pharynx:  Normal Neck:  Supple.  Slightly tender shotty posterior nodes are palpated bilaterally  Lungs:  Clear to auscultation.  Breath sounds are equal.  Heart:  Regular rate and rhythm without murmurs, rubs, or gallops.  Abdomen:  Nontender without masses or hepatosplenomegaly.  Bowel sounds are present.  No CVA or flank tenderness.  Extremities:  No edema.  No calf tenderness Skin:  No rash present.   ED Course  Procedures  none  MDM   1. Influenza-like illness    Begin Tamiflu. If increasing cold-like symptoms develop, try the following: Take plain guaifenesin (1200mg  extended release tabs such as Mucinex) twice daily, with plenty of water, for cough and congestion.  Get adequate rest.   May use Afrin nasal spray (or generic oxymetazoline) twice daily for about 5 days.  Also recommend using saline nasal spray several times daily and saline nasal irrigation (AYR is a common brand). Try warm salt water gargles for sore throat.  Stop all antihistamines for now, and other non-prescription cough/cold preparations. May take Tylenol for fever, headache, etc. May take Delsym Cough Suppressant at bedtime for nighttime cough.   Follow-up with family doctor if not improving about 5 days.     Kandra Nicolas, MD 09/16/14 1159

## 2014-09-16 ENCOUNTER — Telehealth: Payer: Self-pay | Admitting: *Deleted

## 2014-09-16 DIAGNOSIS — Z0000 Encounter for general adult medical examination without abnormal findings: Secondary | ICD-10-CM

## 2014-09-16 NOTE — Telephone Encounter (Signed)
Pt would like labs sent to [facility] for wellness exam next week. So I need to put the dx code as wellness exam even though this will be sent before the actual visit?

## 2014-09-16 NOTE — Telephone Encounter (Signed)
In these cases I link the labs to "medicare annual wellness exam, subsequent" and "annual physical exam". Lab slips in your inbox

## 2014-09-19 NOTE — Telephone Encounter (Signed)
Faxed

## 2014-09-20 DIAGNOSIS — Z0000 Encounter for general adult medical examination without abnormal findings: Secondary | ICD-10-CM | POA: Diagnosis not present

## 2014-09-21 LAB — LIPID PANEL
Cholesterol: 145 mg/dL (ref 0–200)
HDL: 48 mg/dL (ref >=46)
LDL Cholesterol: 68 mg/dL (ref 0–99)
Total CHOL/HDL Ratio: 3 Ratio
Triglycerides: 145 mg/dL (ref <150)
VLDL: 29 mg/dL (ref 0–40)

## 2014-09-21 LAB — COMPLETE METABOLIC PANEL WITH GFR
ALK PHOS: 64 U/L (ref 39–117)
ALT: 19 U/L (ref 0–35)
AST: 27 U/L (ref 0–37)
Albumin: 4.3 g/dL (ref 3.5–5.2)
BILIRUBIN TOTAL: 0.6 mg/dL (ref 0.2–1.2)
BUN: 15 mg/dL (ref 6–23)
CO2: 30 mEq/L (ref 19–32)
Calcium: 9 mg/dL (ref 8.4–10.5)
Chloride: 101 mEq/L (ref 96–112)
Creat: 0.93 mg/dL (ref 0.50–1.10)
GFR, EST AFRICAN AMERICAN: 73 mL/min
GFR, Est Non African American: 63 mL/min
Glucose, Bld: 100 mg/dL — ABNORMAL HIGH (ref 70–99)
POTASSIUM: 4.3 meq/L (ref 3.5–5.3)
Sodium: 141 mEq/L (ref 135–145)
TOTAL PROTEIN: 7.7 g/dL (ref 6.0–8.3)

## 2014-09-21 LAB — CBC
HCT: 40.5 % (ref 36.0–46.0)
Hemoglobin: 13.5 g/dL (ref 12.0–15.0)
MCH: 28.7 pg (ref 26.0–34.0)
MCHC: 33.3 g/dL (ref 30.0–36.0)
MCV: 86 fL (ref 78.0–100.0)
MPV: 9.3 fL (ref 8.6–12.4)
Platelets: 361 10*3/uL (ref 150–400)
RBC: 4.71 MIL/uL (ref 3.87–5.11)
RDW: 13.6 % (ref 11.5–15.5)
WBC: 5.9 10*3/uL (ref 4.0–10.5)

## 2014-09-23 ENCOUNTER — Encounter: Payer: Self-pay | Admitting: Family Medicine

## 2014-09-23 ENCOUNTER — Ambulatory Visit (INDEPENDENT_AMBULATORY_CARE_PROVIDER_SITE_OTHER): Payer: Medicare Other | Admitting: Family Medicine

## 2014-09-23 VITALS — BP 122/61 | HR 62 | Ht 64.0 in | Wt 147.0 lb

## 2014-09-23 DIAGNOSIS — I739 Peripheral vascular disease, unspecified: Secondary | ICD-10-CM | POA: Diagnosis not present

## 2014-09-23 DIAGNOSIS — Z1239 Encounter for other screening for malignant neoplasm of breast: Secondary | ICD-10-CM

## 2014-09-23 DIAGNOSIS — Z0000 Encounter for general adult medical examination without abnormal findings: Secondary | ICD-10-CM

## 2014-09-23 DIAGNOSIS — G4700 Insomnia, unspecified: Secondary | ICD-10-CM | POA: Diagnosis not present

## 2014-09-23 MED ORDER — CLORAZEPATE DIPOTASSIUM 7.5 MG PO TABS: 7.5000 mg | ORAL_TABLET | Freq: Every day | ORAL | Status: DC

## 2014-09-23 NOTE — Progress Notes (Signed)
Subjective:    Traci [name] is a 69 y.o. female who presents for Medicare Annual/Subsequent preventive examination.  Preventive Screening-Counseling & Management  Tobacco History  Smoking status  . Former Smoker  . Types: Cigarettes  . Quit date: 09/30/1993  Smokeless tobacco  . Never Used    Colonoscopy: She and her gastroenterologist are planning on this within the next year Papsmear: No current indication, hysterectomy. Mammogram: 10/2013 normal, orders placed to have this repeated next month  DEXA: Osteopenia 10/2013, will need repeat in one year  Influenza Vaccine: Out of season Pneumovax: Prevnar 2016 Td/Tdap: Up-to-date Zoster: Up-to-date   Problems Prior to Visit 1. Atherosclerosis, hypertension, insomnia.  Her only request today as a refill on clorazepate for insomnia.  Current Problems (verified) Patient Active Problem List   Diagnosis Date Noted  . Irregular heartbeat 07/26/2014  . 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 Preoperative evaluation of a medical condition to rule out surgical contraindications (TAR required) 01/17/2014  . 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  . Hyperlipidemia 04/05/2013  . Pre-syncope 12/06/2012  . Hypertension 12/06/2012  . Peripheral arterial disease 12/06/2012  . Coronary artery disease, non-occlusive, last cath 2009 12/06/2012  . Hyponatremia, improved holding diuretic 12/06/2012  . Atherosclerosis of other specified arteries 04/02/2012  . 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  . Stricture of artery 10/02/2011  . Subclavian artery stenosis, left 10/02/2011    Medications Prior to Visit Current Outpatient Prescriptions on File Prior to Visit  Medication Sig Dispense Refill  . aspirin EC 81 MG tablet Take 81 mg by mouth daily.    Marland Kitchen atenolol (TENORMIN) 25 MG tablet Take 1 tablet (25 mg total) by mouth daily. (Patient taking differently: Take 25 mg by mouth. ) 30 tablet 11  . atorvastatin (LIPITOR) 10 MG tablet Take 1  tablet (10 mg total) by mouth every evening. 30 tablet 10  . candesartan (ATACAND) 16 MG tablet take 1/2 tablet by mouth every morning 15 tablet 8  . cholecalciferol (VITAMIN D) 1000 UNITS tablet Take 1,000 Units by mouth daily.    . clopidogrel (PLAVIX) 75 MG tablet Take 1 tablet (75 mg total) by mouth daily. 30 tablet 10  . EPINEPHrine (EPIPEN 2-PAK) 0.3 mg/0.3 mL DEVI Inject 0.3 mg into the muscle as needed (for allergic reaction).    Marland Kitchen estradiol (ESTRACE) 0.5 MG tablet Take 0.5 mg by mouth daily.    . fexofenadine (ALLEGRA) 180 MG tablet Take 180 mg by mouth daily.    . furosemide (LASIX) 20 MG tablet Take 1 tablet (20 mg total) by mouth every other day. 45 tablet 2  . ipratropium (ATROVENT) 0.06 % nasal spray Place 2 sprays into both nostrils 4 (four) times daily. 15 mL 1  . meclizine (ANTIVERT) 25 MG tablet Take 12.5 mg by mouth 3 (three) times daily as needed.     . Multiple Vitamin (MULTIVITAMIN WITH MINERALS) TABS Take 1 tablet by mouth daily.    Marland Kitchen oseltamivir (TAMIFLU) 75 MG capsule Take 1 capsule (75 mg total) by mouth every 12 (twelve) hours. 10 capsule 0  . zolpidem (AMBIEN) 10 MG tablet Take 10 mg by mouth at bedtime as needed for sleep.     No current facility-administered medications on file prior to visit.    Current Medications (verified) Current Outpatient Prescriptions  Medication Sig Dispense Refill  . aspirin EC 81 MG tablet Take 81 mg by mouth daily.    Marland Kitchen atenolol (TENORMIN) 25 MG tablet Take 1 tablet (25 mg total) by mouth daily. (Patient  taking differently: Take 25 mg by mouth. ) 30 tablet 11  . atorvastatin (LIPITOR) 10 MG tablet Take 1 tablet (10 mg total) by mouth every evening. 30 tablet 10  . candesartan (ATACAND) 16 MG tablet take 1/2 tablet by mouth every morning 15 tablet 8  . cholecalciferol (VITAMIN D) 1000 UNITS tablet Take 1,000 Units by mouth daily.    . clopidogrel (PLAVIX) 75 MG tablet Take 1 tablet (75 mg total) by mouth daily. 30 tablet 10  .  clorazepate (TRANXENE) 7.5 MG tablet Take 1 tablet (7.5 mg total) by mouth daily. 90 tablet 1  . EPINEPHrine (EPIPEN 2-PAK) 0.3 mg/0.3 mL DEVI Inject 0.3 mg into the muscle as needed (for allergic reaction).    Marland Kitchen estradiol (ESTRACE) 0.5 MG tablet Take 0.5 mg by mouth daily.    . fexofenadine (ALLEGRA) 180 MG tablet Take 180 mg by mouth daily.    . furosemide (LASIX) 20 MG tablet Take 1 tablet (20 mg total) by mouth every other day. 45 tablet 2  . ipratropium (ATROVENT) 0.06 % nasal spray Place 2 sprays into both nostrils 4 (four) times daily. 15 mL 1  . meclizine (ANTIVERT) 25 MG tablet Take 12.5 mg by mouth 3 (three) times daily as needed.     . Multiple Vitamin (MULTIVITAMIN WITH MINERALS) TABS Take 1 tablet by mouth daily.    Marland Kitchen oseltamivir (TAMIFLU) 75 MG capsule Take 1 capsule (75 mg total) by mouth every 12 (twelve) hours. 10 capsule 0  . zolpidem (AMBIEN) 10 MG tablet Take 10 mg by mouth at bedtime as needed for sleep.     No current facility-administered medications for this visit.     Allergies (verified) Cortisone; Dilaudid; Iodine; Medrol; Omnipaque; Prednisone; Shellfish allergy; Sulfa drugs cross reactors; Augmentin; Morphine and related; Codeine; and Erythromycin   PAST HISTORY  Family History Family History  Problem Relation Age of Onset  . Heart disease Father     Heart Disease before age 28  . Kidney disease Father   . Heart attack Father   . Hyperlipidemia Father   . Hypertension Father   . Diabetes Mother     Varicose Veins  . Diabetes Maternal Grandmother   . Heart disease Paternal Grandfather     Social History History  Substance Use Topics  . Smoking status: Former Smoker    Types: Cigarettes    Quit date: 09/30/1993  . Smokeless tobacco: Never Used  . Alcohol Use: No     Are there smokers in your home (other than you)? No  Risk Factors Current exercise habits: The patient does not participate in regular exercise at present.  Dietary issues  discussed: DASH   Cardiac risk factors: advanced age (older than 16 for men, 55 for women), dyslipidemia and hypertension.  Depression Screen (Note: if answer to either of the following is "Yes", a more complete depression screening is indicated)   Over the past two weeks, have you felt down, depressed or hopeless? No  Over the past two weeks, have you felt little interest or pleasure in doing things? No  Have you lost interest or pleasure in daily life? No  Do you often feel hopeless? No  Do you cry easily over simple problems? No  Activities of Daily Living In your present state of health, do you have any difficulty performing the following activities?:  Driving? No Managing money?  No Feeding yourself? No Getting from bed to chair? No{ Climbing a flight of stairs? No Preparing food and  eating?: No Bathing or showering? No Getting dressed: No Getting to the toilet? No Using the toilet:No Moving around from place to place: No In the past year have you fallen or had a near fall?:No   Are you sexually active?  No  Do you have more than one partner?  No  Hearing Difficulties: No Do you often ask people to speak up or repeat themselves? No Do you experience ringing or noises in your ears? No Do you have difficulty understanding soft or whispered voices? No   Do you feel that you have a problem with memory? No  Do you often misplace items? No  Do you feel safe at home?  Yes  Cognitive Testing  Alert? Yes  Normal Appearance?Yes  Oriented to person? Yes  Place? Yes   Time? Yes  Recall of three objects?  Yes  Can perform simple calculations? Yes  Displays appropriate judgment?Yes  Can read the correct time from a watch face?Yes   Advanced Directives have been discussed with the patient? Yes  List the Names of Other Physician/Practitioners you currently use: 1.  [facility]  Indicate any recent Medical Services you may have received from other than [facility] providers in the past  year (date may be approximate).  Immunization History  Administered Date(s) Administered  . Pneumococcal Conjugate-13 08/30/2014  . Pneumococcal Polysaccharide-23 06/17/2012  . Tdap 06/17/2010  . Zoster 06/17/2012    Screening Tests Health Maintenance  Topic Date Due  . TETANUS/TDAP  07/15/1965  . MAMMOGRAM  07/15/1996  . COLONOSCOPY  07/15/1996  . ZOSTAVAX  07/15/2006  . DEXA SCAN  07/16/2011  . PNA vac Low Risk Adult (1 of 2 - PCV13) 07/16/2011  . INFLUENZA VACCINE  01/16/2015    All answers were reviewed with the patient and necessary referrals were made:  Marcial Pacas, DO   09/23/2014   History reviewed: allergies, current medications, past family history, past medical history, past social history, past surgical history and problem list  Review of Systems Review of Systems - General ROS: negative for - chills, fever, night sweats, weight gain or weight loss Ophthalmic ROS: negative for - decreased vision Psychological ROS: negative for - anxiety or depression ENT ROS: negative for - hearing change, nasal congestion, tinnitus or allergies Hematological and Lymphatic ROS: negative for - bleeding problems, bruising or swollen lymph nodes Breast ROS: negative Respiratory ROS: no cough, shortness of breath, or wheezing Cardiovascular ROS: no chest pain or dyspnea on exertion Gastrointestinal ROS: no abdominal pain, change in bowel habits, or black or bloody stools Genito-Urinary ROS: negative for - genital discharge, genital ulcers, incontinence or abnormal bleeding from genitals Musculoskeletal ROS: negative for - joint pain or muscle pain Neurological ROS: negative for - headaches or memory loss Dermatological ROS: negative for lumps, mole changes, rash and skin lesion changes   Objective:     Vision by Snellen chart: right eye:20/30, left eye:20/30  Body mass index is 25.22 kg/(m^2). BP 122/61 mmHg  Pulse 62  Ht 5\' 4"  (1.626 m)  Wt 147 lb (66.679 kg)  BMI 25.22  kg/m2  General: No Acute Distress HEENT: Atraumatic, normocephalic, conjunctivae normal without scleral icterus.  No nasal discharge, hearing grossly intact, TMs with good landmarks bilaterally with no middle ear abnormalities, posterior pharynx clear without oral lesions. Neck: Supple, trachea midline, no cervical nor supraclavicular adenopathy. Pulmonary: Clear to auscultation bilaterally without wheezing, rhonchi, nor rales. Cardiac: Regular rate and rhythm.  No murmurs, rubs, nor gallops. No peripheral edema.  2+  peripheral pulses bilaterally. Abdomen: Bowel sounds normal.  No masses.  Non-tender without rebound.  Negative Murphy's sign. MSK: Grossly intact, no signs of weakness.  Full strength throughout upper and lower extremities.  Full ROM in upper and lower extremities.  No midline spinal tenderness. Neuro: Gait unremarkable, CN II-XII grossly intact.  C5-C6 Reflex 2/4 Bilaterally, L4 Reflex 2/4 Bilaterally.  Cerebellar function intact. Skin: No rashes. Psych: Alert and oriented to person/place/time.  Thought process normal. No anxiety/depression.      Assessment:     Normal female exam, up-to-date on preventative services but needs mammogram in 1 month     Plan:     During the course of the visit the patient was educated and counseled about appropriate screening and preventive services including:    Screening mammography  Diet review for nutrition referral? Not indicated Patient Instructions (the written plan) was given to the patient.  Medicare Attestation I have personally reviewed: The patient's medical and social history Their use of alcohol, tobacco or illicit drugs Their current medications and supplements The patient's functional ability including ADLs,fall risks, home safety risks, cognitive, and hearing and visual impairment Diet and physical activities Evidence for depression or mood disorders  The patient's weight, height, BMI, and visual acuity have been  recorded in the chart.  I have made referrals, counseling, and provided education to the patient based on review of the above and I have provided the patient with a written personalized care plan for preventive services.     Marcial Pacas, DO   09/23/2014

## 2014-09-28 ENCOUNTER — Telehealth: Payer: Self-pay | Admitting: *Deleted

## 2014-09-28 DIAGNOSIS — R1084 Generalized abdominal pain: Secondary | ICD-10-CM | POA: Diagnosis not present

## 2014-09-28 DIAGNOSIS — K219 Gastro-esophageal reflux disease without esophagitis: Secondary | ICD-10-CM | POA: Diagnosis not present

## 2014-09-28 DIAGNOSIS — K5909 Other constipation: Secondary | ICD-10-CM | POA: Diagnosis not present

## 2014-09-28 DIAGNOSIS — K551 Chronic vascular disorders of intestine: Secondary | ICD-10-CM | POA: Diagnosis not present

## 2014-09-28 MED ORDER — MECLIZINE HCL 25 MG PO TABS: 12.5000 mg | ORAL_TABLET | Freq: Three times a day (TID) | ORAL | Status: DC | PRN

## 2014-09-28 NOTE — Telephone Encounter (Signed)
Patient needs refill on meclizine called into [facility] on [address].

## 2014-09-28 NOTE — Telephone Encounter (Signed)
Rx sent to [facility]

## 2014-09-28 NOTE — Telephone Encounter (Signed)
Pt requests a refill on meclizine. We have not yet filled this for her so have to route this to a provider

## 2014-11-09 ENCOUNTER — Ambulatory Visit (INDEPENDENT_AMBULATORY_CARE_PROVIDER_SITE_OTHER): Payer: Medicare Other

## 2014-11-09 ENCOUNTER — Encounter: Payer: Self-pay | Admitting: Sports Medicine

## 2014-11-09 ENCOUNTER — Other Ambulatory Visit: Payer: Self-pay | Admitting: Sports Medicine

## 2014-11-09 ENCOUNTER — Ambulatory Visit (INDEPENDENT_AMBULATORY_CARE_PROVIDER_SITE_OTHER): Payer: Medicare Other | Admitting: Sports Medicine

## 2014-11-09 VITALS — BP 111/60 | HR 57 | Temp 98.1°F | Ht 64.0 in | Wt 147.0 lb

## 2014-11-09 DIAGNOSIS — K551 Chronic vascular disorders of intestine: Secondary | ICD-10-CM | POA: Diagnosis not present

## 2014-11-09 DIAGNOSIS — K559 Vascular disorder of intestine, unspecified: Secondary | ICD-10-CM

## 2014-11-09 DIAGNOSIS — R100 Acute abdomen: Secondary | ICD-10-CM

## 2014-11-09 DIAGNOSIS — K5730 Diverticulosis of large intestine without perforation or abscess without bleeding: Secondary | ICD-10-CM | POA: Diagnosis not present

## 2014-11-09 DIAGNOSIS — I739 Peripheral vascular disease, unspecified: Secondary | ICD-10-CM

## 2014-11-09 DIAGNOSIS — R109 Unspecified abdominal pain: Secondary | ICD-10-CM

## 2014-11-09 DIAGNOSIS — T82898A Other specified complication of vascular prosthetic devices, implants and grafts, initial encounter: Secondary | ICD-10-CM

## 2014-11-09 LAB — CBC WITH DIFFERENTIAL/PLATELET
Basophils Absolute: 0.1 K/uL (ref 0.0–0.1)
Basophils Relative: 1 % (ref 0–1)
Eosinophils Absolute: 0.1 10*3/uL (ref 0.0–0.7)
Eosinophils Relative: 2 % (ref 0–5)
HCT: 40.8 % (ref 36.0–46.0)
Hemoglobin: 14.1 g/dL (ref 12.0–15.0)
Lymphocytes Relative: 33 % (ref 12–46)
Lymphs Abs: 2.1 10*3/uL (ref 0.7–4.0)
MCH: 29 pg (ref 26.0–34.0)
MCHC: 34.6 g/dL (ref 30.0–36.0)
MCV: 84 fL (ref 78.0–100.0)
MPV: 8.8 fL (ref 8.6–12.4)
Monocytes Absolute: 0.4 K/uL (ref 0.1–1.0)
Monocytes Relative: 7 % (ref 3–12)
Neutro Abs: 3.6 10*3/uL (ref 1.7–7.7)
Neutrophils Relative %: 57 % (ref 43–77)
Platelets: 344 K/uL (ref 150–400)
RBC: 4.86 MIL/uL (ref 3.87–5.11)
RDW: 13.8 % (ref 11.5–15.5)
WBC: 6.3 K/uL (ref 4.0–10.5)

## 2014-11-09 LAB — URINALYSIS
Bilirubin Urine: NEGATIVE
Glucose, UA: NEGATIVE mg/dL
Hgb urine dipstick: NEGATIVE
Ketones, ur: NEGATIVE mg/dL
Nitrite: NEGATIVE
Protein, ur: NEGATIVE mg/dL
Specific Gravity, Urine: 1.01 (ref 1.005–1.030)
Urobilinogen, UA: 0.2 mg/dL (ref 0.0–1.0)
pH: 5.5 (ref 5.0–8.0)

## 2014-11-09 LAB — COMPREHENSIVE METABOLIC PANEL
ALT: 25 U/L (ref 0–35)
AST: 32 U/L (ref 0–37)
Albumin: 4.5 g/dL (ref 3.5–5.2)
BUN: 12 mg/dL (ref 6–23)
Chloride: 102 mEq/L (ref 96–112)
Potassium: 4.6 mEq/L (ref 3.5–5.3)
Sodium: 140 mEq/L (ref 135–145)
Total Bilirubin: 0.7 mg/dL (ref 0.2–1.2)
Total Protein: 8.3 g/dL (ref 6.0–8.3)

## 2014-11-09 LAB — COMPREHENSIVE METABOLIC PANEL WITH GFR
Alkaline Phosphatase: 74 U/L (ref 39–117)
CO2: 28 meq/L (ref 19–32)
Calcium: 9.4 mg/dL (ref 8.4–10.5)
Creat: 0.92 mg/dL (ref 0.50–1.10)
Glucose, Bld: 110 mg/dL — ABNORMAL HIGH (ref 70–99)

## 2014-11-09 LAB — LIPASE: Lipase: 19 U/L (ref 0–75)

## 2014-11-09 LAB — AMYLASE: Amylase: 42 U/L (ref 0–105)

## 2014-11-09 LAB — LACTATE DEHYDROGENASE: LDH: 225 U/L (ref 94–250)

## 2014-11-09 LAB — LACTIC ACID, PLASMA
LACTIC ACID: 2 mmol/L (ref 0.5–2.2)
LACTIC ACID: 1.1 mmol/L (ref 0.5–2.2)

## 2014-11-09 MED ORDER — POLYETHYLENE GLYCOL 3350 17 G PO PACK: 17.0000 g | PACK | Freq: Two times a day (BID) | ORAL | Status: DC

## 2014-11-09 MED ORDER — SENNOSIDES-DOCUSATE SODIUM 8.6-50 MG PO TABS: 2.0000 | ORAL_TABLET | Freq: Two times a day (BID) | ORAL | Status: DC

## 2014-11-09 NOTE — Assessment & Plan Note (Addendum)
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 Unfortunately having similar symptoms, pain with eating, bloating, abdomen is fairly rigid today. Her going to get a stat CT scan of her abdomen, she is allergic to oral and IV contrast, so will be a noncontrast CT. In addition we are going to get an urgent MR angiogram of the superior mesenteric artery, ideally also the celiac trunk and inferior mesenteric artery. Patient declines any oral antiemetics. Further management will depend on results of the imaging studies.  Labs do not show active ischemia of the intestines , normal white blood cell count, normal lactic acid, CT scan does show what appears to be chronic thickening of the wall of the colon suggestive of and expected with chronic mesenteric insufficiency, we are still awaiting the MR angiogram of the superior mesenteric artery, there is also an unexpectedly large stool burden, this can be seen with intestinal angina, however I am going to do a series of laxatives to help relieve this, as we further investigate her mesenteric artery bypass graft to the aorta tomorrow with the MR angiogram. We are going to do MiraLAX and Senokot S.

## 2014-11-09 NOTE — Progress Notes (Signed)
  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 Unfortunately more recently she's had increasing pain, worse with eating, in the abdomen associated with nausea without any vomiting, no bilious emesis, and no hematochezia. Symptoms do feel similar to prior episode of mesenteric ischemia. Moderate, persistent.  Past medical history, Surgical history, Family history not pertinant except as noted below, Social history, Allergies, and medications have been entered into the medical record, reviewed, and no changes needed.   Review of Systems: No fevers, chills, night sweats, weight loss, chest pain, or shortness of breath.   Objective:    General: Well Developed, well nourished, and in no acute distress.  Neuro: Alert and oriented x3, extra-ocular muscles intact, sensation grossly intact.  HEENT: Normocephalic, atraumatic, pupils equal round reactive to light, neck supple, no masses, no lymphadenopathy, thyroid nonpalpable.  Skin: Warm and dry, no rashes. Cardiac: Regular rate and rhythm, no murmurs rubs or gallops, no lower extremity edema.  Respiratory: Clear to auscultation bilaterally. Not using accessory muscles, speaking in full sentences. Abdomen: Mildly rigid, tender to palpation diffusely, bowel sounds are quiet. Minimal guarding, no rebound tenderness. No audible bruits.  CT reviewed and shows diffuse thickening of the ascending colon consistent with chronic mesenteric insufficiency. Visible diverticulosis but no visible diverticulitis, this was a noncontrast CT. No intra-abdominal collections visible. Abdomen  Impression and Recommendations:     I spent 40 minutes with this patient,  greater than 50% was face-to-face time counseling regarding the above diagnosis

## 2014-11-10 ENCOUNTER — Ambulatory Visit (HOSPITAL_COMMUNITY)
Admission: RE | Admit: 2014-11-10 | Discharge: 2014-11-10 | Disposition: A | Discharge status: Home / Self Care | Payer: Medicare Other | Source: Ambulatory Visit | Attending: Sports Medicine | Admitting: Sports Medicine

## 2014-11-10 ENCOUNTER — Other Ambulatory Visit: Payer: Self-pay | Admitting: Sports Medicine

## 2014-11-10 DIAGNOSIS — R1010 Upper abdominal pain, unspecified: Secondary | ICD-10-CM | POA: Insufficient documentation

## 2014-11-10 DIAGNOSIS — R1030 Lower abdominal pain, unspecified: Secondary | ICD-10-CM | POA: Insufficient documentation

## 2014-11-10 DIAGNOSIS — K551 Chronic vascular disorders of intestine: Secondary | ICD-10-CM

## 2014-11-10 DIAGNOSIS — Z951 Presence of aortocoronary bypass graft: Secondary | ICD-10-CM | POA: Diagnosis not present

## 2014-11-10 DIAGNOSIS — IMO0001 Reserved for inherently not codable concepts without codable children: Secondary | ICD-10-CM

## 2014-11-10 DIAGNOSIS — K550 Acute vascular disorders of intestine: Secondary | ICD-10-CM | POA: Diagnosis not present

## 2014-11-10 DIAGNOSIS — K838 Other specified diseases of biliary tract: Secondary | ICD-10-CM | POA: Diagnosis not present

## 2014-11-10 DIAGNOSIS — R634 Abnormal weight loss: Secondary | ICD-10-CM

## 2014-11-10 DIAGNOSIS — I700 Atherosclerosis of aorta: Secondary | ICD-10-CM | POA: Insufficient documentation

## 2014-11-16 ENCOUNTER — Ambulatory Visit (INDEPENDENT_AMBULATORY_CARE_PROVIDER_SITE_OTHER): Payer: Medicare Other

## 2014-11-16 DIAGNOSIS — Z1231 Encounter for screening mammogram for malignant neoplasm of breast: Secondary | ICD-10-CM

## 2014-11-16 DIAGNOSIS — Z1239 Encounter for other screening for malignant neoplasm of breast: Secondary | ICD-10-CM

## 2014-11-23 DIAGNOSIS — R140 Abdominal distension (gaseous): Secondary | ICD-10-CM | POA: Diagnosis not present

## 2014-12-02 ENCOUNTER — Other Ambulatory Visit: Payer: Self-pay | Admitting: Cardiovascular Disease

## 2014-12-02 NOTE — Telephone Encounter (Signed)
Rx(s) sent to pharmacy electronically.  

## 2014-12-09 ENCOUNTER — Other Ambulatory Visit: Payer: Self-pay | Admitting: *Deleted

## 2014-12-09 ENCOUNTER — Telehealth: Payer: Self-pay | Admitting: Cardiovascular Disease

## 2014-12-09 ENCOUNTER — Encounter: Payer: Self-pay | Admitting: Cardiovascular Disease

## 2014-12-09 ENCOUNTER — Ambulatory Visit (INDEPENDENT_AMBULATORY_CARE_PROVIDER_SITE_OTHER): Payer: Medicare Other | Admitting: Cardiovascular Disease

## 2014-12-09 VITALS — BP 122/66 | HR 59 | Ht 64.0 in | Wt 147.8 lb

## 2014-12-09 DIAGNOSIS — I739 Peripheral vascular disease, unspecified: Secondary | ICD-10-CM | POA: Diagnosis not present

## 2014-12-09 DIAGNOSIS — I6529 Occlusion and stenosis of unspecified carotid artery: Secondary | ICD-10-CM | POA: Diagnosis not present

## 2014-12-09 DIAGNOSIS — I10 Essential (primary) hypertension: Secondary | ICD-10-CM

## 2014-12-09 DIAGNOSIS — I701 Atherosclerosis of renal artery: Secondary | ICD-10-CM

## 2014-12-09 MED ORDER — ESTRADIOL 0.5 MG PO TABS: 0.5000 mg | ORAL_TABLET | Freq: Every day | ORAL | Status: DC

## 2014-12-09 NOTE — Telephone Encounter (Signed)
Returning your call. °

## 2014-12-09 NOTE — Patient Instructions (Signed)
Your physician has requested that you have a carotid duplex. This test is an ultrasound of the carotid arteries in your neck. It looks at blood flow through these arteries that supply the brain with blood. Allow one hour for this exam. There are no restrictions or special instructions.  Your physician has requested that you have a renal artery duplex. During this test, an ultrasound is used to evaluate blood flow to the kidneys. Allow one hour for this exam. Do not eat after midnight the day before and avoid carbonated beverages. Take your medications as you usually do.  Your physician recommends that you schedule a follow-up appointment in 6 months with Dr. Sallyanne Kuster.

## 2014-12-09 NOTE — Progress Notes (Signed)
Patient ID: Traci [name], female   DOB: [birth year], 69 y.o.   MRN: [id number]      Cardiology Office Note   Date:  12/09/2014   ID:  EMMA-LEE ODDO, DOB 10/02/1945, MRN [id number]  PCP:  Marcial Pacas, DO  Cardiologist:   Sanda Klein, MD   Chief Complaint  Patient presents with  . Annual Exam    1 year followup;  pt had abdominal aorta sma bypass performed by dr Doren Custard; nochest pain, no shortness of breath, no edema, no pain in legs, has cramping in legs-thinks its caused by lipitor, occassional lightheadedness, occassional dizziness, blood pressure dropping, left side of face was numb      History of Present Illness: Traci [name] is a 69 y.o. female who presents for  Follow-up for extensive peripheral arterial disease , moderate coronary artery disease , history of paroxysmal atrial fibrillation , hyperlipidemia and essential hypertension.   His last appointment she underwent a bypass from the aorta to the chronically totally occluded superficial mesenteric artery due to symptoms of chronic mesenteric ischemia, Dr. Scot Dock August 2015  6 mm Dacron graft. Her recovery was lengthy but has led to substantial improvement in her complaints of abdominal pain and distention.  Percutaneous intervention was attempted but was unsuccessful due to total occlusion. Note was made that her aortic pressure was higher than the detective cuff pressure consistent with bilateral subclavian artery stenosis.  She had some abdominal complaints last month and an MRA of the abdomen on May 26  Showed patent celiac artery, patent aorta to SMA bypass graft, patent bilateral renal arteries (single right , dual left ).  Mrs. [place] has long-standing history of peripheral arterial disease with previous stents and surgical revascularization of the left subclavian artery for subclavian steal syndrome and presyncope. She eventually underwent a left carotid to subclavian bypass.    In mid 2014 she was seen in the emergency room with symptomatic hyponatremia related to diuretic therapy. She feels much better after discontinuing the diuretic.  A lower extremity venous duplex study performed in August 2014 did not show significant insufficiency.  A lower extremity arterial duplex study performed in July 2014 showed bilateral ABIs of 1.1 without any significant obstructive lesions.  A carotid/left subclavian artery duplex ultrasound performed in May 2014 showed a widely patent bypass.  Echocardiography performed in June showed normal left ventricular systolic and diastolic function without any meaningful for abnormalities.  A nuclear myocardial perfusion study in March 2015 was normal.    Past Medical History  Diagnosis Date  . Hypertension 11/30/2010    echo- EF 55%; normal w/ mildly sclerotic aortic valve  . Hyperlipidemia   . Coronary artery disease   . GERD (gastroesophageal reflux disease)   . Osteopenia   . Sigmoid diverticulitis   . Peripheral vascular disease   . Atrial fibrillation   . Nonspecific ST-T wave electrocardiographic changes 03/26/2011    R/Lmv - EF 74%, normal perfusion all regions, ST depression w/ Lexiscan infusion w/o assoc angina  . CAD (coronary artery disease)   . PAD (peripheral artery disease)   . PVD (peripheral vascular disease) 11/05/2011    doppler - R/L brachial pressures essentially equal w/o inflow disease; L sublclavian/CCA bypass graft demonstrates patent flow, no evidence of significant stenosis  . Hypertension 11/05/11    renal dopplers - celiac artery and SMA >50% diameter reduction, R renal artery - mildly elevated velocities 1-59% diameter reduction, L renal artery normal  . Claudication 01/06/2008  Lower extremity dopplers - no evidence of arterial insufficiency, normal exam  . Anxiety     Past Surgical History  Procedure Laterality Date  . Cholecystectomy    . Appendectomy    . Abdominal hysterectomy    .  Subclavian artery stents  01/23/2010    Left carotid to subclavian artery Bypass  . Spine surgery  Feb. 2011  . Subclavian artery stent  1995    X's  several  . Subclavian artery stent Left 09/20/2008    LSA ISR 7x24mm Cordis Genesis on Opta premount, reduction from 80% to 0%  . Cardiac catheterization  06/03/2008    60% LAD, involving D2, borderline significant by IVUS, medical therapy. CFX, RCA OK.  . Eye surgery      retinal surgery  . Abdominal aortic aneurysm repair N/A 01/27/2014    Procedure: AORTO-SUPERIOR MESENTERIC ARTERY BYPASS GRAFT;  Surgeon: Angelia Mould, MD;  Location: [place];  Service: Vascular;  Laterality: N/A;  . Visceral angiogram  01/17/2014    Procedure: VISCERAL ANGIOGRAM;  Surgeon: Angelia Mould, MD;  Location: [facility] CATH LAB;  Service: Cardiovascular;;     Current Outpatient Prescriptions  Medication Sig Dispense Refill  . aspirin EC 81 MG tablet Take 81 mg by mouth daily.    Marland Kitchen atenolol (TENORMIN) 25 MG tablet take 1 tablet by mouth once daily 30 tablet 11  . atorvastatin (LIPITOR) 10 MG tablet Take 1 tablet (10 mg total) by mouth every evening. 30 tablet 10  . candesartan (ATACAND) 16 MG tablet take 1/2 tablet by mouth every morning 15 tablet 8  . cholecalciferol (VITAMIN D) 1000 UNITS tablet Take 1,000 Units by mouth daily.    . clopidogrel (PLAVIX) 75 MG tablet Take 1 tablet (75 mg total) by mouth daily. 30 tablet 10  . clorazepate (TRANXENE) 7.5 MG tablet Take 1 tablet (7.5 mg total) by mouth daily. 90 tablet 1  . cyanocobalamin 1000 MCG tablet Take 100 mcg by mouth daily.    Marland Kitchen dicyclomine (BENTYL) 10 MG capsule Take 10 mg by mouth daily. Take 1 tab daily as needed    . EPINEPHrine (EPIPEN 2-PAK) 0.3 mg/0.3 mL DEVI Inject 0.3 mg into the muscle as needed (for allergic reaction).    Marland Kitchen esomeprazole (NEXIUM) 40 MG capsule Take 40 mg by mouth daily. Take 1 tab daily    . fexofenadine (ALLEGRA) 180 MG tablet Take 180 mg by mouth daily.    . furosemide  (LASIX) 20 MG tablet Take 1 tablet (20 mg total) by mouth every other day. 45 tablet 2  . ipratropium (ATROVENT) 0.06 % nasal spray Place 2 sprays into both nostrils 4 (four) times daily. 15 mL 1  . lactase (LACTAID) 3000 UNITS tablet Take 3,000 Units by mouth 3 (three) times daily with meals.    . meclizine (ANTIVERT) 25 MG tablet Take 0.5 tablets (12.5 mg total) by mouth 3 (three) times daily as needed. 60 tablet 4  . Multiple Vitamin (MULTIVITAMIN WITH MINERALS) TABS Take 1 tablet by mouth daily.    . polyethylene glycol (MIRALAX / GLYCOLAX) packet Take 17 g by mouth 2 (two) times daily. Until stooling regularly 30 packet 11  . zolpidem (AMBIEN) 10 MG tablet Take 10 mg by mouth at bedtime as needed for sleep.    Marland Kitchen estradiol (ESTRACE) 0.5 MG tablet Take 1 tablet (0.5 mg total) by mouth daily. 30 tablet 11   No current facility-administered medications for this visit.    Allergies:   Cortisone;  Dilaudid; Iodine; Medrol; Omnipaque; Prednisone; Shellfish allergy; Sulfa drugs cross reactors; Augmentin; Morphine and related; Codeine; and Erythromycin    Social History:  The patient  reports that she quit smoking about 21 years ago. Her smoking use included Cigarettes. She has never used smokeless tobacco. She reports that she does not drink alcohol or use illicit drugs.   Family History:  The patient's family history includes Diabetes in her maternal grandmother and mother; Heart attack in her father; Heart disease in her father and paternal grandfather; Hyperlipidemia in her father; Hypertension in her father; Kidney disease in her father.    ROS:  Please see the history of present illness.    Otherwise, review of systems positive for none.   All other systems are reviewed and negative.    PHYSICAL EXAM: VS:  BP 122/66 mmHg  Pulse 59  Ht 5\' 4"  (1.626 m)  Wt 147 lb 12.8 oz (67.042 kg)  BMI 25.36 kg/m2 , BMI Body mass index is 25.36 kg/(m^2).  General: Alert, oriented x3, no distress   Head: no evidence of trauma, PERRL, EOMI, no exophtalmos or lid lag, no myxedema, no xanthelasma; normal ears, nose and oropharynx  Neck: normal jugular venous pulsations and no hepatojugular reflux; brisk carotid pulses without delay and faint bilateral carotid bruits; left supraclavicular scar of previous bypass procedure ; the soft tissues are chronically prominent there Chest: clear to auscultation, no signs of consolidation by percussion or palpation, normal fremitus, symmetrical and full respiratory excursions  Cardiovascular: normal position and quality of the apical impulse, regular rhythm, normal first and second heart sounds, no murmurs, rubs or gallops  Abdomen: no tenderness or distention, no masses by palpation, no abnormal pulsatility or arterial bruits, normal bowel sounds, no hepatosplenomegaly  Extremities: no clubbing, cyanosis or edema; 2+ radial, ulnar and brachial pulses bilaterally; 2+ right femoral, posterior tibial and dorsalis pedis pulses; 2+ left femoral, posterior tibial and dorsalis pedis pulses; no subclavian or femoral bruits  Neurological: grossly nonfocal Psych: euthymic mood, full affect   EKG:  EKG is ordered today. The ekg ordered today demonstrates  Sinus bradycardia otherwise normal   Recent Labs: 01/28/2014: Magnesium 1.8 11/09/2014: ALT 25; BUN 12; Creat 0.92; Hemoglobin 14.1; Platelets 344; Potassium 4.6; Sodium 140    Lipid Panel    Component Value Date/Time   CHOL 145 09/20/2014 0921   CHOL 141 04/07/2013 0857   TRIG 145 09/20/2014 0921   HDL 48 09/20/2014 0921   HDL 47 04/07/2013 0857   CHOLHDL 3.0 09/20/2014 0921   CHOLHDL 3.0 04/07/2013 0857   VLDL 29 09/20/2014 0921   LDLCALC 68 09/20/2014 0921   LDLCALC 67 04/07/2013 0857      Wt Readings from Last 3 Encounters:  12/09/14 147 lb 12.8 oz (67.042 kg)  11/09/14 147 lb (66.679 kg)  09/23/14 147 lb (66.679 kg)      ASSESSMENT AND PLAN:  Subclavian artery stenosis,  left Status post carotid subclavian bypass. Asymptomatic. Time to recheck her duplex ultrasound.  Probably has some degree of right  subclavian stenosis as well in view of the difference between cuff pressure and aortic pressure.  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  The patient does not have concerns regarding medicines.  The following changes have been made:  no change  Labs/ tests ordered today include:  Orders Placed This Encounter  Procedures  . EKG 12-Lead   Patient Instructions  Your physician has requested that you have a carotid duplex. This test is an ultrasound of the carotid arteries in your neck. It looks at blood flow through these arteries that supply the brain with blood. Allow one hour for this exam. There are no restrictions or special instructions.  Your physician has requested that you have a renal artery duplex. During this test, an ultrasound is used to evaluate blood flow to the kidneys. Allow one hour for this exam. Do not eat after midnight the day before and avoid carbonated beverages. Take your medications as you usually do.  Your physician recommends that you schedule a follow-up appointment in 6 months with Dr. Sallyanne Kuster.    Mikael Spray, MD  12/09/2014 12:00 PM    Sanda Klein, MD, [facility]  [facility] [phone] office [phone] pager

## 2014-12-09 NOTE — Addendum Note (Signed)
Addended byChauncy Lean. on: 12/09/2014 01:29 PM   Modules accepted: Orders

## 2014-12-09 NOTE — Addendum Note (Signed)
Addended byChauncy Lean. on: 12/09/2014 01:01 PM   Modules accepted: Orders

## 2014-12-09 NOTE — Telephone Encounter (Signed)
I spoke with patient and explained that Dr C gave me a verbal order to cancel the renal dopplers because she recently had an abdominal MRI that looked at the renal arteries.  She verbalized understanding.

## 2014-12-12 ENCOUNTER — Emergency Department
Admission: EM | Admit: 2014-12-12 | Discharge: 2014-12-12 | Disposition: A | Discharge status: Home / Self Care | Payer: Medicare Other | Source: Home / Self Care | Attending: Emergency Medicine | Admitting: Emergency Medicine

## 2014-12-12 ENCOUNTER — Encounter: Payer: Self-pay | Admitting: Emergency Medicine

## 2014-12-12 DIAGNOSIS — J0120 Acute ethmoidal sinusitis, unspecified: Secondary | ICD-10-CM

## 2014-12-12 MED ORDER — CEPHALEXIN 500 MG PO CAPS: 500.0000 mg | ORAL_CAPSULE | Freq: Four times a day (QID) | ORAL | Status: DC

## 2014-12-12 NOTE — ED Provider Notes (Signed)
CSN: [id number]     Arrival date & time 12/12/14  0919 History   First MD Initiated Contact with Patient 12/12/14 1001     Chief Complaint  Patient presents with  . URI   (Consider location/radiation/quality/duration/timing/severity/associated sxs/prior Treatment) Patient is a 69 y.o. female presenting with URI. The history is provided by the patient. No language interpreter was used.  URI Presenting symptoms: congestion and cough   Severity:  Moderate Onset quality:  Gradual Duration:  3 days Timing:  Constant Progression:  Worsening Chronicity:  New Relieved by:  Nothing Ineffective treatments:  None tried Associated symptoms: sinus pain   Associated symptoms: no headaches   Risk factors: no chronic respiratory disease     Past Medical History  Diagnosis Date  . Hypertension 11/30/2010    echo- EF 55%; normal w/ mildly sclerotic aortic valve  . Hyperlipidemia   . Coronary artery disease   . GERD (gastroesophageal reflux disease)   . Osteopenia   . Sigmoid diverticulitis   . Peripheral vascular disease   . Atrial fibrillation   . Nonspecific ST-T wave electrocardiographic changes 03/26/2011    R/Lmv - EF 74%, normal perfusion all regions, ST depression w/ Lexiscan infusion w/o assoc angina  . CAD (coronary artery disease)   . PAD (peripheral artery disease)   . PVD (peripheral vascular disease) 11/05/2011    doppler - R/L brachial pressures essentially equal w/o inflow disease; L sublclavian/CCA bypass graft demonstrates patent flow, no evidence of significant stenosis  . Hypertension 11/05/11    renal dopplers - celiac artery and SMA >50% diameter reduction, R renal artery - mildly elevated velocities 1-59% diameter reduction, L renal artery normal  . Claudication 01/06/2008    Lower extremity dopplers - no evidence of arterial insufficiency, normal exam  . Anxiety    Past Surgical History  Procedure Laterality Date  . Cholecystectomy    . Appendectomy    . Abdominal  hysterectomy    . Subclavian artery stents  01/23/2010    Left carotid to subclavian artery Bypass  . Spine surgery  Feb. 2011  . Subclavian artery stent  1995    X's  several  . Subclavian artery stent Left 09/20/2008    LSA ISR 7x72mm Cordis Genesis on Opta premount, reduction from 80% to 0%  . Cardiac catheterization  06/03/2008    60% LAD, involving D2, borderline significant by IVUS, medical therapy. CFX, RCA OK.  . Eye surgery      retinal surgery  . Abdominal aortic aneurysm repair N/A 01/27/2014    Procedure: AORTO-SUPERIOR MESENTERIC ARTERY BYPASS GRAFT;  Surgeon: Angelia Mould, MD;  Location: [place];  Service: Vascular;  Laterality: N/A;  . Visceral angiogram  01/17/2014    Procedure: VISCERAL ANGIOGRAM;  Surgeon: Angelia Mould, MD;  Location: [facility] CATH LAB;  Service: Cardiovascular;;   Family History  Problem Relation Age of Onset  . Heart disease Father     Heart Disease before age 17  . Kidney disease Father   . Heart attack Father   . Hyperlipidemia Father   . Hypertension Father   . Diabetes Mother     Varicose Veins  . Diabetes Maternal Grandmother   . Heart disease Paternal Grandfather    History  Substance Use Topics  . Smoking status: Former Smoker    Types: Cigarettes    Quit date: 09/30/1993  . Smokeless tobacco: Never Used  . Alcohol Use: No   OB History    No data available  Review of Systems  HENT: Positive for congestion.   Respiratory: Positive for cough.   Neurological: Negative for headaches.  All other systems reviewed and are negative.   Allergies  Cortisone; Dilaudid; Iodine; Medrol; Omnipaque; Prednisone; Shellfish allergy; Sulfa drugs cross reactors; Augmentin; Morphine and related; Codeine; and Erythromycin  Home Medications   Prior to Admission medications   Medication Sig Start Date End Date Taking? Authorizing Provider  aspirin EC 81 MG tablet Take 81 mg by mouth daily.    Historical Provider, MD  atenolol  (TENORMIN) 25 MG tablet take 1 tablet by mouth once daily 12/02/14   Dani Gobble Croitoru, MD  atorvastatin (LIPITOR) 10 MG tablet Take 1 tablet (10 mg total) by mouth every evening. 04/28/14   Mihai Croitoru, MD  candesartan (ATACAND) 16 MG tablet take 1/2 tablet by mouth every morning 04/13/14   Mihai Croitoru, MD  cephALEXin (KEFLEX) 500 MG capsule Take 1 capsule (500 mg total) by mouth 4 (four) times daily. 12/12/14   Fransico Meadow, PA-C  cholecalciferol (VITAMIN D) 1000 UNITS tablet Take 1,000 Units by mouth daily.    Historical Provider, MD  clopidogrel (PLAVIX) 75 MG tablet Take 1 tablet (75 mg total) by mouth daily. 04/28/14   Mihai Croitoru, MD  clorazepate (TRANXENE) 7.5 MG tablet Take 1 tablet (7.5 mg total) by mouth daily. 09/23/14   Sean Hommel, DO  cyanocobalamin 1000 MCG tablet Take 100 mcg by mouth daily.    Historical Provider, MD  dicyclomine (BENTYL) 10 MG capsule Take 10 mg by mouth daily. Take 1 tab daily as needed 09/28/14   Historical Provider, MD  EPINEPHrine (EPIPEN 2-PAK) 0.3 mg/0.3 mL DEVI Inject 0.3 mg into the muscle as needed (for allergic reaction).    Historical Provider, MD  esomeprazole (NEXIUM) 40 MG capsule Take 40 mg by mouth daily. Take 1 tab daily 09/28/14   Historical Provider, MD  estradiol (ESTRACE) 0.5 MG tablet Take 1 tablet (0.5 mg total) by mouth daily. 12/09/14   Sean Hommel, DO  fexofenadine (ALLEGRA) 180 MG tablet Take 180 mg by mouth daily.    Historical Provider, MD  furosemide (LASIX) 20 MG tablet Take 1 tablet (20 mg total) by mouth every other day. 02/22/14   Mihai Croitoru, MD  ipratropium (ATROVENT) 0.06 % nasal spray Place 2 sprays into both nostrils 4 (four) times daily. 08/20/14   Gregor Hams, MD  lactase (LACTAID) 3000 UNITS tablet Take 3,000 Units by mouth 3 (three) times daily with meals.    Historical Provider, MD  meclizine (ANTIVERT) 25 MG tablet Take 0.5 tablets (12.5 mg total) by mouth 3 (three) times daily as needed. 09/28/14   Marcial Pacas, DO   Multiple Vitamin (MULTIVITAMIN WITH MINERALS) TABS Take 1 tablet by mouth daily.    Historical Provider, MD  polyethylene glycol (MIRALAX / GLYCOLAX) packet Take 17 g by mouth 2 (two) times daily. Until stooling regularly 11/09/14   Silverio Decamp, MD  zolpidem (AMBIEN) 10 MG tablet Take 10 mg by mouth at bedtime as needed for sleep.    Historical Provider, MD   BP 124/73 mmHg  Pulse 61  Temp(Src) 98.3 F (36.8 C) (Oral)  Ht 5\' 3"  (1.6 m)  Wt 143 lb (64.864 kg)  BMI 25.34 kg/m2  SpO2 100% Physical Exam  Constitutional: She is oriented to person, place, and time. She appears well-developed and well-nourished.  HENT:  Head: Normocephalic.  Right Ear: External ear normal.  Left Ear: External ear normal.  Mouth/Throat: Oropharynx is  clear and moist.  Tender maxillary sinuses  Eyes: Conjunctivae and EOM are normal. Pupils are equal, round, and reactive to light.  Neck: Normal range of motion.  Cardiovascular: Normal rate and normal heart sounds.   Pulmonary/Chest: Effort normal.  Abdominal: Soft. She exhibits no distension.  Musculoskeletal: Normal range of motion.  Neurological: She is alert and oriented to person, place, and time.  Skin: Skin is warm.  Psychiatric: She has a normal mood and affect.  Nursing note and vitals reviewed.   ED Course  Procedures (including critical care time) Labs Review Labs Reviewed - No data to display  Imaging Review No results found.   MDM   1. Acute ethmoidal sinusitis, recurrence not specified    Keflex avs See your Physicain for recheck    Fransico Meadow, PA-C 12/12/14 1456

## 2014-12-12 NOTE — ED Notes (Signed)
3 days, Chills, night sweats, eyes watering, congestion, hoarseness, ear pain, ulcer on tongue

## 2014-12-12 NOTE — Discharge Instructions (Signed)

## 2014-12-22 ENCOUNTER — Inpatient Hospital Stay (HOSPITAL_COMMUNITY): Admission: RE | Admit: 2014-12-22 | Payer: Medicare Other | Source: Ambulatory Visit

## 2014-12-22 ENCOUNTER — Encounter (HOSPITAL_COMMUNITY): Payer: Medicare Other

## 2014-12-22 ENCOUNTER — Ambulatory Visit (HOSPITAL_COMMUNITY): Payer: Medicare Other | Attending: Cardiovascular Disease

## 2014-12-22 DIAGNOSIS — Z95828 Presence of other vascular implants and grafts: Secondary | ICD-10-CM | POA: Diagnosis not present

## 2014-12-22 DIAGNOSIS — I10 Essential (primary) hypertension: Secondary | ICD-10-CM | POA: Diagnosis not present

## 2014-12-22 DIAGNOSIS — Z48812 Encounter for surgical aftercare following surgery on the circulatory system: Secondary | ICD-10-CM | POA: Insufficient documentation

## 2014-12-22 DIAGNOSIS — I2510 Atherosclerotic heart disease of native coronary artery without angina pectoris: Secondary | ICD-10-CM | POA: Diagnosis not present

## 2014-12-22 DIAGNOSIS — I739 Peripheral vascular disease, unspecified: Secondary | ICD-10-CM

## 2014-12-22 DIAGNOSIS — E785 Hyperlipidemia, unspecified: Secondary | ICD-10-CM | POA: Diagnosis not present

## 2015-01-09 DIAGNOSIS — K5900 Constipation, unspecified: Secondary | ICD-10-CM | POA: Diagnosis not present

## 2015-01-09 DIAGNOSIS — R140 Abdominal distension (gaseous): Secondary | ICD-10-CM | POA: Diagnosis not present

## 2015-01-11 ENCOUNTER — Other Ambulatory Visit: Payer: Self-pay | Admitting: Cardiovascular Disease

## 2015-02-06 ENCOUNTER — Encounter: Payer: Self-pay | Admitting: Family Medicine

## 2015-02-06 ENCOUNTER — Ambulatory Visit (INDEPENDENT_AMBULATORY_CARE_PROVIDER_SITE_OTHER): Payer: Medicare Other | Admitting: Family Medicine

## 2015-02-06 VITALS — BP 104/68 | HR 69 | Temp 98.3°F | Wt 146.0 lb

## 2015-02-06 DIAGNOSIS — R509 Fever, unspecified: Secondary | ICD-10-CM | POA: Diagnosis not present

## 2015-02-06 DIAGNOSIS — I701 Atherosclerosis of renal artery: Secondary | ICD-10-CM

## 2015-02-06 MED ORDER — OSELTAMIVIR PHOSPHATE 75 MG PO CAPS: 75.0000 mg | ORAL_CAPSULE | Freq: Every day | ORAL | Status: DC

## 2015-02-06 MED ORDER — PROMETHAZINE HCL 25 MG PO TABS: 25.0000 mg | ORAL_TABLET | Freq: Three times a day (TID) | ORAL | Status: DC | PRN

## 2015-02-06 NOTE — Progress Notes (Signed)
CC: Traci [name] is a 69 y.o. female is here for Sore Throat   Subjective: HPI:  On Friday of last week patient developed acute onset watery stool, diffuse myalgias, subjective fever, night sweats and chills. Symptoms have been persistent since onset and over the weekend she developed nausea with consumption of anything liquid or solid. She doesn't have much of an appetite. She is not vomiting yet but feels like she could at any moment. No interventions as of yet other than trying to stay hydrated with broth and bland food. She's had 4 pimples since the onset of this illness but no other rash. She's had some mild shortness of breath but no chest pain.  She denies any nasal congestion, sore throat, urinary urgency, dysuria, blood in stool, nor abdominal discomfort.   Review Of Systems Outlined In HPI  Past Medical History  Diagnosis Date  . Hypertension 11/30/2010    echo- EF 55%; normal w/ mildly sclerotic aortic valve  . Hyperlipidemia   . Coronary artery disease   . GERD (gastroesophageal reflux disease)   . Osteopenia   . Sigmoid diverticulitis   . Peripheral vascular disease   . Atrial fibrillation   . Nonspecific ST-T wave electrocardiographic changes 03/26/2011    R/Lmv - EF 74%, normal perfusion all regions, ST depression w/ Lexiscan infusion w/o assoc angina  . CAD (coronary artery disease)   . PAD (peripheral artery disease)   . PVD (peripheral vascular disease) 11/05/2011    doppler - R/L brachial pressures essentially equal w/o inflow disease; L sublclavian/CCA bypass graft demonstrates patent flow, no evidence of significant stenosis  . Hypertension 11/05/11    renal dopplers - celiac artery and SMA >50% diameter reduction, R renal artery - mildly elevated velocities 1-59% diameter reduction, L renal artery normal  . Claudication 01/06/2008    Lower extremity dopplers - no evidence of arterial insufficiency, normal exam  . Anxiety     Past Surgical History   Procedure Laterality Date  . Cholecystectomy    . Appendectomy    . Abdominal hysterectomy    . Subclavian artery stents  01/23/2010    Left carotid to subclavian artery Bypass  . Spine surgery  Feb. 2011  . Subclavian artery stent  1995    X's  several  . Subclavian artery stent Left 09/20/2008    LSA ISR 7x39mm Cordis Genesis on Opta premount, reduction from 80% to 0%  . Cardiac catheterization  06/03/2008    60% LAD, involving D2, borderline significant by IVUS, medical therapy. CFX, RCA OK.  . Eye surgery      retinal surgery  . Abdominal aortic aneurysm repair N/A 01/27/2014    Procedure: AORTO-SUPERIOR MESENTERIC ARTERY BYPASS GRAFT;  Surgeon: Angelia Mould, MD;  Location: [facility];  Service: Vascular;  Laterality: N/A;  . Visceral angiogram  01/17/2014    Procedure: VISCERAL ANGIOGRAM;  Surgeon: Angelia Mould, MD;  Location: [facility] CATH LAB;  Service: Cardiovascular;;   Family History  Problem Relation Age of Onset  . Heart disease Father     Heart Disease before age 48  . Kidney disease Father   . Heart attack Father   . Hyperlipidemia Father   . Hypertension Father   . Diabetes Mother     Varicose Veins  . Diabetes Maternal Grandmother   . Heart disease Paternal Grandfather     Social History   Social History  . Marital Status: Legally Separated    Spouse Name: N/A  .  Number of Children: N/A  . Years of Education: N/A   Occupational History  . Not on file.   Social History Main Topics  . Smoking status: Former Smoker    Types: Cigarettes    Quit date: 09/30/1993  . Smokeless tobacco: Never Used  . Alcohol Use: No  . Drug Use: No  . Sexual Activity: Not on file   Other Topics Concern  . Not on file   Social History Narrative     Objective: BP 104/68 mmHg  Pulse 69  Temp(Src) 98.3 F (36.8 C) (Oral)  Wt 146 lb (66.225 kg)  General: Alert and Oriented, No Acute Distress HEENT: Pupils equal, round, reactive to light. Conjunctivae clear.   External ears unremarkable, canals clear with intact TMs with appropriate landmarks.  Middle ear appears open without effusion. Pink inferior turbinates.  Moist mucous membranes, pharynx without inflammation nor lesions.  Neck supple without palpable lymphadenopathy nor abnormal masses. Lungs: Clear to auscultation bilaterally, no wheezing/ronchi/rales.  Comfortable work of breathing. Good air movement. Cardiac: Regular rate and rhythm. Normal S1/S2.  No murmurs, rubs, nor gallops.   Abdomen: soft nontender Extremities: No peripheral edema.  Strong peripheral pulses.  Mental Status: No depression, anxiety, nor agitation. Skin: Warm and dry.  Assessment & Plan: [name] was seen today for sore throat.  Diagnoses and all orders for this visit:  Fever, unspecified fever cause -     oseltamivir (TAMIFLU) 75 MG capsule; Take 1 capsule (75 mg total) by mouth daily. -     promethazine (PHENERGAN) 25 MG tablet; Take 1 tablet (25 mg total) by mouth every 8 (eight) hours as needed for nausea or vomiting. -     CBC with Differential/Platelet -     COMPLETE METABOLIC PANEL WITH GFR   Symptoms are highly suggestive of influenza, we do not have rapid testing today therefore will assume that she is suffering from the influenza virus and I've encouraged her to start Tamiflu along with as needed Phenergan for nausea control. I would like her to have a differential along with metabolic panel in order to ensure there is not something more serious going on. Signs and symptoms requring emergent/urgent reevaluation were discussed with the patient.  Return if symptoms worsen or fail to improve.

## 2015-02-07 LAB — COMPLETE METABOLIC PANEL WITH GFR
ALT: 21 U/L (ref 6–29)
AST: 30 U/L (ref 10–35)
Albumin: 4 g/dL (ref 3.6–5.1)
Alkaline Phosphatase: 63 U/L (ref 33–130)
BUN: 14 mg/dL (ref 7–25)
CALCIUM: 8.9 mg/dL (ref 8.6–10.4)
CO2: 27 mmol/L (ref 20–31)
Chloride: 104 mmol/L (ref 98–110)
Creat: 0.96 mg/dL (ref 0.50–0.99)
GFR, EST AFRICAN AMERICAN: 70 mL/min (ref >=60)
GFR, Est Non African American: 61 mL/min (ref >=60)
Glucose, Bld: 93 mg/dL (ref 65–99)
POTASSIUM: 4.8 mmol/L (ref 3.5–5.3)
Sodium: 145 mmol/L (ref 135–146)
Total Bilirubin: 0.4 mg/dL (ref 0.2–1.2)
Total Protein: 6.6 g/dL (ref 6.1–8.1)

## 2015-02-07 LAB — CBC WITH DIFFERENTIAL/PLATELET
Basophils Absolute: 0.1 10*3/uL (ref 0.0–0.1)
Basophils Relative: 1 % (ref 0–1)
Eosinophils Absolute: 0.8 10*3/uL — ABNORMAL HIGH (ref 0.0–0.7)
Eosinophils Relative: 16 % — ABNORMAL HIGH (ref 0–5)
HEMATOCRIT: 38.9 % (ref 36.0–46.0)
Hemoglobin: 12.8 g/dL (ref 12.0–15.0)
LYMPHS PCT: 37 % (ref 12–46)
Lymphs Abs: 1.9 10*3/uL (ref 0.7–4.0)
MCH: 28.4 pg (ref 26.0–34.0)
MCHC: 32.9 g/dL (ref 30.0–36.0)
MCV: 86.4 fL (ref 78.0–100.0)
MONO ABS: 0.4 10*3/uL (ref 0.1–1.0)
MPV: 9.3 fL (ref 8.6–12.4)
Monocytes Relative: 7 % (ref 3–12)
Neutro Abs: 2 10*3/uL (ref 1.7–7.7)
Neutrophils Relative %: 39 % — ABNORMAL LOW (ref 43–77)
Platelets: 332 10*3/uL (ref 150–400)
RBC: 4.5 MIL/uL (ref 3.87–5.11)
RDW: 14.1 % (ref 11.5–15.5)
WBC: 5.2 10*3/uL (ref 4.0–10.5)

## 2015-02-16 ENCOUNTER — Other Ambulatory Visit: Payer: Self-pay | Admitting: *Deleted

## 2015-02-16 ENCOUNTER — Telehealth: Payer: Self-pay | Admitting: Family Medicine

## 2015-02-16 DIAGNOSIS — G4700 Insomnia, unspecified: Secondary | ICD-10-CM

## 2015-02-16 MED ORDER — CLORAZEPATE DIPOTASSIUM 7.5 MG PO TABS: 7.5000 mg | ORAL_TABLET | Freq: Every day | ORAL | Status: DC

## 2015-02-16 MED ORDER — ATORVASTATIN CALCIUM 10 MG PO TABS: 10.0000 mg | ORAL_TABLET | Freq: Every evening | ORAL | Status: DC

## 2015-02-16 MED ORDER — CLOPIDOGREL BISULFATE 75 MG PO TABS: 75.0000 mg | ORAL_TABLET | Freq: Every day | ORAL | Status: DC

## 2015-02-16 MED ORDER — CANDESARTAN CILEXETIL 16 MG PO TABS: 8.0000 mg | ORAL_TABLET | Freq: Every morning | ORAL | Status: DC

## 2015-02-16 MED ORDER — ATENOLOL 25 MG PO TABS: 25.0000 mg | ORAL_TABLET | Freq: Every day | ORAL | Status: DC

## 2015-02-16 MED ORDER — ATENOLOL 25 MG PO TABS
25.0000 mg | ORAL_TABLET | Freq: Every day | ORAL | Status: DC
Start: 2015-02-16 — End: 2015-02-16

## 2015-02-16 MED ORDER — MECLIZINE HCL 25 MG PO TABS: 12.5000 mg | ORAL_TABLET | Freq: Three times a day (TID) | ORAL | Status: DC | PRN

## 2015-02-16 MED ORDER — FUROSEMIDE 20 MG PO TABS: 20.0000 mg | ORAL_TABLET | ORAL | Status: DC

## 2015-02-16 NOTE — Telephone Encounter (Signed)
faxed

## 2015-02-16 NOTE — Telephone Encounter (Signed)
Traci [name], Rx placed in in-box ready for pickup/faxing. [facility]

## 2015-02-17 ENCOUNTER — Telehealth: Payer: Self-pay | Admitting: Cardiovascular Disease

## 2015-02-17 NOTE — Telephone Encounter (Signed)
Medications [facility] 02/16/2015.  Patient voiced understanding.

## 2015-02-17 NOTE — Telephone Encounter (Signed)
She wanted you to know that you should be getting a fax from [facility]. They will be her new pharmacy.She also said all her prescriptions must be under the name Traci [name],if not they will not pay for them.

## 2015-02-21 ENCOUNTER — Other Ambulatory Visit: Payer: Self-pay | Admitting: Family Medicine

## 2015-02-21 MED ORDER — ESTRADIOL 0.5 MG PO TABS: 0.5000 mg | ORAL_TABLET | Freq: Every day | ORAL | Status: DC

## 2015-02-21 NOTE — Telephone Encounter (Signed)
Requested mail order pharm.

## 2015-02-22 ENCOUNTER — Other Ambulatory Visit: Payer: Self-pay | Admitting: *Deleted

## 2015-02-22 MED ORDER — ATENOLOL 25 MG PO TABS: 25.0000 mg | ORAL_TABLET | Freq: Every day | ORAL | Status: DC

## 2015-02-22 MED ORDER — FUROSEMIDE 20 MG PO TABS: 20.0000 mg | ORAL_TABLET | ORAL | Status: DC

## 2015-02-22 MED ORDER — CLOPIDOGREL BISULFATE 75 MG PO TABS: 75.0000 mg | ORAL_TABLET | Freq: Every day | ORAL | Status: DC

## 2015-02-22 MED ORDER — CANDESARTAN CILEXETIL 16 MG PO TABS: 8.0000 mg | ORAL_TABLET | Freq: Every morning | ORAL | Status: DC

## 2015-02-22 MED ORDER — ATORVASTATIN CALCIUM 10 MG PO TABS: 10.0000 mg | ORAL_TABLET | Freq: Every evening | ORAL | Status: DC

## 2015-02-22 NOTE — Telephone Encounter (Signed)
ELECTRONIC REFILLS ON ATENOLOL, ATORVASTATIN,FUROSEMIDE,CANDESARTAN,AND CLOPIDOGREL TO [facility].

## 2015-02-23 ENCOUNTER — Telehealth: Payer: Self-pay | Admitting: Cardiovascular Disease

## 2015-02-23 ENCOUNTER — Other Ambulatory Visit: Payer: Self-pay | Admitting: Cardiovascular Disease

## 2015-02-23 MED ORDER — ESOMEPRAZOLE MAGNESIUM 40 MG PO CPDR: 40.0000 mg | DELAYED_RELEASE_CAPSULE | Freq: Every day | ORAL | Status: AC

## 2015-02-23 NOTE — Telephone Encounter (Signed)
°  1. Which medications need to be refilled? All   2. Which pharmacy is medication to be sent to? [facility]   3. Do they need a 30 day or 90 day supply? 90  4. Would they like a call back once the medication has been sent to the pharmacy? Yes   The fax number is - [phone] [employer] # [phone]

## 2015-02-24 NOTE — Telephone Encounter (Signed)
Has this been taken care of?

## 2015-02-24 NOTE — Telephone Encounter (Signed)
meds refilled already

## 2015-03-05 ENCOUNTER — Emergency Department: Admission: EM | Admit: 2015-03-05 | Discharge: 2015-03-05 | Payer: Medicare Other | Source: Home / Self Care

## 2015-03-05 ENCOUNTER — Encounter (HOSPITAL_BASED_OUTPATIENT_CLINIC_OR_DEPARTMENT_OTHER): Payer: Self-pay | Admitting: Emergency Medicine

## 2015-03-05 ENCOUNTER — Emergency Department (HOSPITAL_BASED_OUTPATIENT_CLINIC_OR_DEPARTMENT_OTHER): Payer: Medicare Other

## 2015-03-05 ENCOUNTER — Emergency Department (HOSPITAL_BASED_OUTPATIENT_CLINIC_OR_DEPARTMENT_OTHER)
Admission: EM | Admit: 2015-03-05 | Discharge: 2015-03-05 | Disposition: A | Discharge status: Home / Self Care | Payer: Medicare Other | Attending: Emergency Medicine | Admitting: Emergency Medicine

## 2015-03-05 DIAGNOSIS — Z79899 Other long term (current) drug therapy: Secondary | ICD-10-CM | POA: Insufficient documentation

## 2015-03-05 DIAGNOSIS — I4891 Unspecified atrial fibrillation: Secondary | ICD-10-CM | POA: Insufficient documentation

## 2015-03-05 DIAGNOSIS — I2510 Atherosclerotic heart disease of native coronary artery without angina pectoris: Secondary | ICD-10-CM | POA: Diagnosis not present

## 2015-03-05 DIAGNOSIS — Z7982 Long term (current) use of aspirin: Secondary | ICD-10-CM | POA: Diagnosis not present

## 2015-03-05 DIAGNOSIS — Z87891 Personal history of nicotine dependence: Secondary | ICD-10-CM | POA: Insufficient documentation

## 2015-03-05 DIAGNOSIS — R05 Cough: Secondary | ICD-10-CM | POA: Diagnosis not present

## 2015-03-05 DIAGNOSIS — F419 Anxiety disorder, unspecified: Secondary | ICD-10-CM | POA: Diagnosis not present

## 2015-03-05 DIAGNOSIS — R0989 Other specified symptoms and signs involving the circulatory and respiratory systems: Secondary | ICD-10-CM | POA: Insufficient documentation

## 2015-03-05 DIAGNOSIS — R221 Localized swelling, mass and lump, neck: Secondary | ICD-10-CM | POA: Diagnosis not present

## 2015-03-05 DIAGNOSIS — K219 Gastro-esophageal reflux disease without esophagitis: Secondary | ICD-10-CM | POA: Diagnosis not present

## 2015-03-05 DIAGNOSIS — M8580 Other specified disorders of bone density and structure, unspecified site: Secondary | ICD-10-CM | POA: Diagnosis not present

## 2015-03-05 DIAGNOSIS — E785 Hyperlipidemia, unspecified: Secondary | ICD-10-CM | POA: Insufficient documentation

## 2015-03-05 DIAGNOSIS — M25512 Pain in left shoulder: Secondary | ICD-10-CM | POA: Diagnosis present

## 2015-03-05 DIAGNOSIS — I10 Essential (primary) hypertension: Secondary | ICD-10-CM | POA: Insufficient documentation

## 2015-03-05 MED ORDER — HYDROCODONE-ACETAMINOPHEN 5-325 MG PO TABS: 1.0000 | ORAL_TABLET | ORAL | Status: DC | PRN

## 2015-03-05 NOTE — Discharge Instructions (Signed)
Call Dr. Doren Custard for a follow-up office appointment this week.

## 2015-03-05 NOTE — ED Notes (Signed)
Left arm/ shoulder pain, onset this past Thursday, states also has some swelling noted at left subclavin area, pt c/o soreness and tenderness at left subclavin site.

## 2015-03-05 NOTE — ED Notes (Signed)
Pt reports left shoulder pain with edema to left distal neck and left upper back, pt was seen at urgent care and sent here for evaluation

## 2015-03-05 NOTE — ED Notes (Signed)
At bedside with Stormy Card RN.

## 2015-03-06 ENCOUNTER — Ambulatory Visit (INDEPENDENT_AMBULATORY_CARE_PROVIDER_SITE_OTHER): Payer: Medicare Other | Admitting: Family Medicine

## 2015-03-06 ENCOUNTER — Encounter: Payer: Self-pay | Admitting: Family Medicine

## 2015-03-06 VITALS — BP 142/64 | HR 60 | Temp 98.3°F | Wt 147.0 lb

## 2015-03-06 DIAGNOSIS — J329 Chronic sinusitis, unspecified: Secondary | ICD-10-CM | POA: Diagnosis not present

## 2015-03-06 DIAGNOSIS — B9689 Other specified bacterial agents as the cause of diseases classified elsewhere: Secondary | ICD-10-CM

## 2015-03-06 DIAGNOSIS — I701 Atherosclerosis of renal artery: Secondary | ICD-10-CM

## 2015-03-06 DIAGNOSIS — A499 Bacterial infection, unspecified: Secondary | ICD-10-CM | POA: Diagnosis not present

## 2015-03-06 MED ORDER — CEFDINIR 300 MG PO CAPS: 300.0000 mg | ORAL_CAPSULE | Freq: Two times a day (BID) | ORAL | Status: AC

## 2015-03-06 NOTE — Progress Notes (Signed)
CC: Traci [name] is a 69 y.o. female is here for Sinusitis   Subjective: HPI:  Facial pain in the left cheek and ear pain. This is been present for the last 4 days. Accompanied by fevers and chills. Symptoms are persistent, nothing seems to make it better or worse they're moderate in severity. Occasionally having a sore throat. Accompanied by nasal congestion and postnasal drip. She was evaluated at a local emergency room and had an unremarkable ultrasound of the neck. Symptoms are present all hours of the day. Denies difficulty swallowing, change in voice, shortness of breath, chest pain or swollen lymph nodes.   Review Of Systems Outlined In HPI  Past Medical History  Diagnosis Date  . Hypertension 11/30/2010    echo- EF 55%; normal w/ mildly sclerotic aortic valve  . Hyperlipidemia   . Coronary artery disease   . GERD (gastroesophageal reflux disease)   . Osteopenia   . Sigmoid diverticulitis   . Peripheral vascular disease   . Atrial fibrillation   . Nonspecific ST-T wave electrocardiographic changes 03/26/2011    R/Lmv - EF 74%, normal perfusion all regions, ST depression w/ Lexiscan infusion w/o assoc angina  . CAD (coronary artery disease)   . PAD (peripheral artery disease)   . PVD (peripheral vascular disease) 11/05/2011    doppler - R/L brachial pressures essentially equal w/o inflow disease; L sublclavian/CCA bypass graft demonstrates patent flow, no evidence of significant stenosis  . Hypertension 11/05/11    renal dopplers - celiac artery and SMA >50% diameter reduction, R renal artery - mildly elevated velocities 1-59% diameter reduction, L renal artery normal  . Claudication 01/06/2008    Lower extremity dopplers - no evidence of arterial insufficiency, normal exam  . Anxiety     Past Surgical History  Procedure Laterality Date  . Cholecystectomy    . Appendectomy    . Abdominal hysterectomy    . Subclavian artery stents  01/23/2010    Left carotid to  subclavian artery Bypass  . Spine surgery  Feb. 2011  . Subclavian artery stent  1995    X's  several  . Subclavian artery stent Left 09/20/2008    LSA ISR 7x36mm Cordis Genesis on Opta premount, reduction from 80% to 0%  . Cardiac catheterization  06/03/2008    60% LAD, involving D2, borderline significant by IVUS, medical therapy. CFX, RCA OK.  . Eye surgery      retinal surgery  . Abdominal aortic aneurysm repair N/A 01/27/2014    Procedure: AORTO-SUPERIOR MESENTERIC ARTERY BYPASS GRAFT;  Surgeon: Angelia Mould, MD;  Location: [facility];  Service: Vascular;  Laterality: N/A;  . Visceral angiogram  01/17/2014    Procedure: VISCERAL ANGIOGRAM;  Surgeon: Angelia Mould, MD;  Location: [facility] CATH LAB;  Service: Cardiovascular;;   Family History  Problem Relation Age of Onset  . Heart disease Father     Heart Disease before age 71  . Kidney disease Father   . Heart attack Father   . Hyperlipidemia Father   . Hypertension Father   . Diabetes Mother     Varicose Veins  . Diabetes Maternal Grandmother   . Heart disease Paternal Grandfather     Social History   Social History  . Marital Status: Legally Separated    Spouse Name: N/A  . Number of Children: N/A  . Years of Education: N/A   Occupational History  . Not on file.   Social History Main Topics  . Smoking status:  Former Smoker    Types: Cigarettes    Quit date: 09/30/1993  . Smokeless tobacco: Never Used  . Alcohol Use: No  . Drug Use: No  . Sexual Activity: Not on file   Other Topics Concern  . Not on file   Social History Narrative     Objective: BP 142/64 mmHg  Pulse 60  Temp(Src) 98.3 F (36.8 C) (Oral)  Wt 147 lb (66.679 kg)  General: Alert and Oriented, No Acute Distress HEENT: Pupils equal, round, reactive to light. Conjunctivae clear.  External ears unremarkable, canals clear with intact TMs with appropriate landmarks.  Middle ear appears open without effusion. Pink inferior turbinates.  Moist  mucous membranes, pharynx with moderate cobblestoning and postnasal drip.  Neck supple without palpable lymphadenopathy nor abnormal masses. Lungs: Clear to auscultation bilaterally, no wheezing/ronchi/rales.  Comfortable work of breathing. Good air movement. Cardiac: Regular rate and rhythm. Normal S1/S2.  No murmurs, rubs, nor gallops.   Extremities: No peripheral edema.  Strong peripheral pulses.  Mental Status: No depression, anxiety, nor agitation. Skin: Warm and dry.  Assessment & Plan: Traci [name] was seen today for sinusitis.  Diagnoses and all orders for this visit:  Bacterial sinusitis -     cefdinir (OMNICEF) 300 MG capsule; Take 1 capsule (300 mg total) by mouth 2 (two) times daily.   Bacterial sinusitis: She is tolerated Keflex in the past therefore starting cefdinir.consider nasal saline washes.Signs and symptoms requring emergent/urgent reevaluation were discussed with the patient.  Return if symptoms worsen or fail to improve.

## 2015-03-08 NOTE — ED Provider Notes (Signed)
CSN: [id number]     Arrival date & time 03/05/15  1156 History   First MD Initiated Contact with Patient 03/05/15 1224     Chief Complaint  Patient presents with  . Shoulder Pain     HPI  Patient presents for evaluation with a complaint of "I think I got "crud". She's had a cough and runny nose for several days. Seen in urgent care center and she also pointed out that she had some swelling above her left subclavian area. 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 Follows with vascular surgery in [place]. She states she does not low how long the swelling has been there. States in the past when she's gotten "the crud", in reference up her respiratory infections that she "thinks it swells up". No arm pain or symptoms. No claudication of her arm or weakness. Is not pale or painful.  Past Medical History  Diagnosis Date  . Hypertension 11/30/2010    echo- EF 55%; normal w/ mildly sclerotic aortic valve  . Hyperlipidemia   . Coronary artery disease   . GERD (gastroesophageal reflux disease)   . Osteopenia   . Sigmoid diverticulitis   . Peripheral vascular disease   . Atrial fibrillation   . Nonspecific ST-T wave electrocardiographic changes 03/26/2011    R/Lmv - EF 74%, normal perfusion all regions, ST depression w/ Lexiscan infusion w/o assoc angina  . CAD (coronary artery disease)   . PAD (peripheral artery disease)   . PVD (peripheral vascular disease) 11/05/2011    doppler - R/L brachial pressures essentially equal w/o inflow disease; L sublclavian/CCA bypass graft demonstrates patent flow, no evidence of significant stenosis  . Hypertension 11/05/11    renal dopplers - celiac artery and SMA >50% diameter reduction, R renal artery - mildly elevated velocities 1-59% diameter reduction, L renal artery normal  . Claudication 01/06/2008    Lower extremity dopplers - no evidence  of arterial insufficiency, normal exam  . Anxiety    Past Surgical History  Procedure Laterality Date  . Cholecystectomy    . Appendectomy    . Abdominal hysterectomy    . Subclavian artery stents  01/23/2010    Left carotid to subclavian artery Bypass  . Spine surgery  Feb. 2011  . Subclavian artery stent  1995    X's  several  . Subclavian artery stent Left 09/20/2008    LSA ISR 7x33mm Cordis Genesis on Opta premount, reduction from 80% to 0%  . Cardiac catheterization  06/03/2008    60% LAD, involving D2, borderline significant by IVUS, medical therapy. CFX, RCA OK.  . Eye surgery      retinal surgery  . Abdominal aortic aneurysm repair N/A 01/27/2014    Procedure: AORTO-SUPERIOR MESENTERIC ARTERY BYPASS GRAFT;  Surgeon: Angelia Mould, MD;  Location: [facility];  Service: Vascular;  Laterality: N/A;  . Visceral angiogram  01/17/2014    Procedure: VISCERAL ANGIOGRAM;  Surgeon: Angelia Mould, MD;  Location: [facility] CATH LAB;  Service: Cardiovascular;;   Family History  Problem Relation Age of Onset  . Heart disease Father     Heart Disease before age 42  . Kidney disease Father   . Heart attack Father   . Hyperlipidemia Father   . Hypertension Father   . Diabetes Mother     Varicose Veins  . Diabetes Maternal Grandmother   . Heart disease Paternal Grandfather    Social  History  Substance Use Topics  . Smoking status: Former Smoker    Types: Cigarettes    Quit date: 09/30/1993  . Smokeless tobacco: Never Used  . Alcohol Use: No   OB History    No data available     Review of Systems  Constitutional: Negative for fever, chills, diaphoresis, appetite change and fatigue.  HENT: Positive for congestion. Negative for mouth sores, sore throat and trouble swallowing.   Eyes: Negative for visual disturbance.  Respiratory: Positive for cough. Negative for chest tightness, shortness of breath and wheezing.        "Swelling" of left subclavian.  Cardiovascular: Negative for  chest pain.  Gastrointestinal: Negative for nausea, vomiting, abdominal pain, diarrhea and abdominal distention.  Endocrine: Negative for polydipsia, polyphagia and polyuria.  Genitourinary: Negative for dysuria, frequency and hematuria.  Musculoskeletal: Negative for gait problem.  Skin: Negative for color change, pallor and rash.  Neurological: Negative for dizziness, syncope, light-headedness and headaches.  Hematological: Does not bruise/bleed easily.  Psychiatric/Behavioral: Negative for behavioral problems and confusion.      Allergies  Cortisone; Dilaudid; Iodine; Medrol; Omnipaque; Prednisone; Shellfish allergy; Sulfa drugs cross reactors; Augmentin; Morphine and related; Codeine; and Erythromycin  Home Medications   Prior to Admission medications   Medication Sig Start Date End Date Taking? Authorizing Provider  aspirin EC 81 MG tablet Take 81 mg by mouth daily.   Yes Historical Provider, MD  atenolol (TENORMIN) 25 MG tablet Take 1 tablet (25 mg total) by mouth daily. 02/22/15  Yes Mihai Croitoru, MD  atorvastatin (LIPITOR) 10 MG tablet Take 1 tablet (10 mg total) by mouth every evening. 02/22/15  Yes Mihai Croitoru, MD  candesartan (ATACAND) 16 MG tablet Take 0.5 tablets (8 mg total) by mouth every morning. 02/22/15  Yes Mihai Croitoru, MD  cholecalciferol (VITAMIN D) 1000 UNITS tablet Take 1,000 Units by mouth daily.   Yes Historical Provider, MD  clopidogrel (PLAVIX) 75 MG tablet Take 1 tablet (75 mg total) by mouth daily. 02/22/15  Yes Mihai Croitoru, MD  clorazepate (TRANXENE) 7.5 MG tablet Take 1 tablet (7.5 mg total) by mouth daily. 02/16/15  Yes Sean Hommel, DO  cyanocobalamin 1000 MCG tablet Take 100 mcg by mouth daily.   Yes Historical Provider, MD  esomeprazole (NEXIUM) 40 MG capsule Take 1 capsule (40 mg total) by mouth daily. Take 1 tab daily 02/23/15  Yes Mihai Croitoru, MD  estradiol (ESTRACE) 0.5 MG tablet Take 1 tablet (0.5 mg total) by mouth daily. 02/21/15  Yes Sean Hommel,  DO  fexofenadine (ALLEGRA) 180 MG tablet Take 180 mg by mouth daily.   Yes Historical Provider, MD  furosemide (LASIX) 20 MG tablet Take 1 tablet (20 mg total) by mouth every other day. 02/22/15  Yes Mihai Croitoru, MD  ipratropium (ATROVENT) 0.06 % nasal spray Place 2 sprays into both nostrils 4 (four) times daily. 08/20/14  Yes Gregor Hams, MD  meclizine (ANTIVERT) 25 MG tablet Take 0.5 tablets (12.5 mg total) by mouth 3 (three) times daily as needed for dizziness or nausea. 02/16/15  Yes Marcial Pacas, DO  Multiple Vitamin (MULTIVITAMIN WITH MINERALS) TABS Take 1 tablet by mouth daily.   Yes Historical Provider, MD  polyethylene glycol (MIRALAX / GLYCOLAX) packet Take 17 g by mouth 2 (two) times daily. Until stooling regularly 11/09/14  Yes Silverio Decamp, MD  zolpidem (AMBIEN) 10 MG tablet Take 10 mg by mouth at bedtime as needed for sleep.   Yes Historical Provider, MD  cefdinir (OMNICEF)  300 MG capsule Take 1 capsule (300 mg total) by mouth 2 (two) times daily. 03/06/15 03/16/15  Sean Hommel, DO  EPINEPHrine (EPIPEN 2-PAK) 0.3 mg/0.3 mL DEVI Inject 0.3 mg into the muscle as needed (for allergic reaction).    Historical Provider, MD  HYDROcodone-acetaminophen (NORCO/VICODIN) 5-325 MG per tablet Take 1 tablet by mouth every 4 (four) hours as needed. 03/05/15   Tanna Furry, MD  promethazine (PHENERGAN) 25 MG tablet Take 1 tablet (25 mg total) by mouth every 8 (eight) hours as needed for nausea or vomiting. 02/06/15   Sean Hommel, DO   BP 127/98 mmHg  Pulse 55  Temp(Src) 98.2 F (36.8 C) (Oral)  Resp 18  Ht 5\' 4"  (1.626 m)  Wt 143 lb (64.864 kg)  BMI 24.53 kg/m2  SpO2 95% Physical Exam  Constitutional: She is oriented to person, place, and time. She appears well-developed and well-nourished. No distress.  HENT:  Head: Normocephalic.  Eyes: Conjunctivae are normal. Pupils are equal, round, and reactive to light. No scleral icterus.  Neck: Normal range of motion. Neck supple. No thyromegaly  present.  Cardiovascular: Normal rate and regular rhythm.  Exam reveals no gallop and no friction rub.   No murmur heard. Pulmonary/Chest: Effort normal and breath sounds normal. No respiratory distress. She has no wheezes. She has no rales.    Abdominal: Soft. Bowel sounds are normal. She exhibits no distension. There is no tenderness. There is no rebound.  Musculoskeletal: Normal range of motion.  Neurological: She is alert and oriented to person, place, and time.  Skin: Skin is warm and dry. No rash noted.  Psychiatric: She has a normal mood and affect. Her behavior is normal.    ED Course  Procedures (including critical care time) Labs Review Labs Reviewed - No data to display  Imaging Review No results found. I have personally reviewed and evaluated these images and lab results as part of my medical decision-making.   EKG Interpretation   Date/Time:  Sunday March 05 2015 12:12:10 EDT Ventricular Rate:  60 PR Interval:  148 QRS Duration: 82 QT Interval:  418 QTC Calculation: 418 R Axis:   40 Text Interpretation:  Normal sinus rhythm Normal ECG ED PHYSICIAN  INTERPRETATION AVAILABLE IN [facility] Confirmed by TEST, Record  ([id number]) on 03/06/2015 7:07:46 AM      MDM   Final diagnoses:  Neck swelling    I discussed the case with Dr. Trula Slade. I described a normal neurovascular exam of the left upper extremity. We discussed limitations of diagnostics at [facility], and also consideration of the fact the patient has anaphylaxis with IV contrast. We discussed ultrasound. He felt that the patient had the exam I have described, and showed no sign of pseudoaneurysm or significant fluid collection that she could be safely discharged to follow-up with her vascular surgeon on Wednesday. Ultrasound shows no fluid. No ultrasound abnormalities to suggest pseudoaneurysm area she was discharged home. Will follow up with her surgeon for a follow-up appointment.    Tanna Furry,  MD 03/08/15 [phone]

## 2015-03-11 DIAGNOSIS — R42 Dizziness and giddiness: Secondary | ICD-10-CM | POA: Diagnosis not present

## 2015-03-11 DIAGNOSIS — R404 Transient alteration of awareness: Secondary | ICD-10-CM | POA: Diagnosis not present

## 2015-03-11 DIAGNOSIS — R531 Weakness: Secondary | ICD-10-CM | POA: Diagnosis not present

## 2015-03-12 ENCOUNTER — Emergency Department (HOSPITAL_COMMUNITY)
Admission: EM | Admit: 2015-03-12 | Discharge: 2015-03-12 | Disposition: A | Discharge status: Home / Self Care | Payer: Medicare Other | Attending: Emergency Medicine | Admitting: Emergency Medicine

## 2015-03-12 ENCOUNTER — Encounter (HOSPITAL_COMMUNITY): Payer: Self-pay

## 2015-03-12 ENCOUNTER — Emergency Department (HOSPITAL_COMMUNITY): Payer: Medicare Other

## 2015-03-12 DIAGNOSIS — M25512 Pain in left shoulder: Secondary | ICD-10-CM | POA: Diagnosis not present

## 2015-03-12 DIAGNOSIS — K219 Gastro-esophageal reflux disease without esophagitis: Secondary | ICD-10-CM | POA: Diagnosis not present

## 2015-03-12 DIAGNOSIS — E785 Hyperlipidemia, unspecified: Secondary | ICD-10-CM | POA: Insufficient documentation

## 2015-03-12 DIAGNOSIS — R079 Chest pain, unspecified: Secondary | ICD-10-CM | POA: Insufficient documentation

## 2015-03-12 DIAGNOSIS — Z7982 Long term (current) use of aspirin: Secondary | ICD-10-CM | POA: Insufficient documentation

## 2015-03-12 DIAGNOSIS — M25511 Pain in right shoulder: Secondary | ICD-10-CM | POA: Insufficient documentation

## 2015-03-12 DIAGNOSIS — Z79899 Other long term (current) drug therapy: Secondary | ICD-10-CM | POA: Insufficient documentation

## 2015-03-12 DIAGNOSIS — R0602 Shortness of breath: Secondary | ICD-10-CM | POA: Diagnosis not present

## 2015-03-12 DIAGNOSIS — Z9889 Other specified postprocedural states: Secondary | ICD-10-CM | POA: Diagnosis not present

## 2015-03-12 DIAGNOSIS — I10 Essential (primary) hypertension: Secondary | ICD-10-CM | POA: Insufficient documentation

## 2015-03-12 DIAGNOSIS — Z87891 Personal history of nicotine dependence: Secondary | ICD-10-CM | POA: Insufficient documentation

## 2015-03-12 DIAGNOSIS — R0600 Dyspnea, unspecified: Secondary | ICD-10-CM

## 2015-03-12 DIAGNOSIS — F419 Anxiety disorder, unspecified: Secondary | ICD-10-CM | POA: Insufficient documentation

## 2015-03-12 DIAGNOSIS — I2510 Atherosclerotic heart disease of native coronary artery without angina pectoris: Secondary | ICD-10-CM | POA: Insufficient documentation

## 2015-03-12 LAB — CBC WITH DIFFERENTIAL/PLATELET
BASOS ABS: 0 10*3/uL (ref 0.0–0.1)
BASOS PCT: 1 %
EOS PCT: 7 %
Eosinophils Absolute: 0.4 10*3/uL (ref 0.0–0.7)
HCT: 34.7 % — ABNORMAL LOW (ref 36.0–46.0)
Hemoglobin: 11.7 g/dL — ABNORMAL LOW (ref 12.0–15.0)
LYMPHS PCT: 31 %
Lymphs Abs: 1.7 10*3/uL (ref 0.7–4.0)
MCH: 29.8 pg (ref 26.0–34.0)
MCHC: 33.7 g/dL (ref 30.0–36.0)
MCV: 88.3 fL (ref 78.0–100.0)
MONO ABS: 0.3 10*3/uL (ref 0.1–1.0)
MONOS PCT: 6 %
Neutro Abs: 3.1 10*3/uL (ref 1.7–7.7)
Neutrophils Relative %: 55 %
PLATELETS: 268 10*3/uL (ref 150–400)
RBC: 3.93 MIL/uL (ref 3.87–5.11)
RDW: 12.5 % (ref 11.5–15.5)
WBC: 5.6 10*3/uL (ref 4.0–10.5)

## 2015-03-12 LAB — COMPREHENSIVE METABOLIC PANEL
ALT: 21 U/L (ref 14–54)
ANION GAP: 8 (ref 5–15)
AST: 27 U/L (ref 15–41)
Albumin: 3.5 g/dL (ref 3.5–5.0)
Alkaline Phosphatase: 54 U/L (ref 38–126)
BILIRUBIN TOTAL: 0.3 mg/dL (ref 0.3–1.2)
BUN: 14 mg/dL (ref 6–20)
CHLORIDE: 102 mmol/L (ref 101–111)
CO2: 28 mmol/L (ref 22–32)
Calcium: 8.5 mg/dL — ABNORMAL LOW (ref 8.9–10.3)
Creatinine, Ser: 0.98 mg/dL (ref 0.44–1.00)
GFR calc Af Amer: >60 mL/min (ref >60)
GFR, EST NON AFRICAN AMERICAN: 58 mL/min — AB (ref >60)
Glucose, Bld: 120 mg/dL — ABNORMAL HIGH (ref 65–99)
POTASSIUM: 4.3 mmol/L (ref 3.5–5.1)
Sodium: 138 mmol/L (ref 135–145)
TOTAL PROTEIN: 6.5 g/dL (ref 6.5–8.1)

## 2015-03-12 LAB — D-DIMER, QUANTITATIVE (NOT AT ARMC)

## 2015-03-12 LAB — BRAIN NATRIURETIC PEPTIDE: B NATRIURETIC PEPTIDE 5: 64.6 pg/mL (ref 0.0–100.0)

## 2015-03-12 LAB — TROPONIN I

## 2015-03-12 MED ORDER — ALBUTEROL SULFATE HFA 108 (90 BASE) MCG/ACT IN AERS
2.0000 | INHALATION_SPRAY | RESPIRATORY_TRACT | Status: DC | PRN
  Administered 2015-03-12: 2 via RESPIRATORY_TRACT
  Filled 2015-03-12: qty 6.7

## 2015-03-12 MED ORDER — ALBUTEROL SULFATE HFA 108 (90 BASE) MCG/ACT IN AERS
2.0000 | INHALATION_SPRAY | RESPIRATORY_TRACT | Status: DC
  Administered 2015-03-12: 2 via RESPIRATORY_TRACT
  Filled 2015-03-12: qty 6.7

## 2015-03-12 NOTE — ED Notes (Signed)
Pt arrived via EMS from home c/o SOB.  Pt is unlabored on room air on arrival.

## 2015-03-12 NOTE — ED Provider Notes (Signed)
CSN: [id number]     Arrival date & time 03/12/15  0032 History  By signing my name below, I, Sonum Patel, attest that this documentation has been prepared under the direction and in the presence of Jola Schmidt, MD. Electronically Signed: Sonum Patel, Education administrator. 03/12/2015. 12:39 AM.    Chief Complaint  Patient presents with  . Shortness of Breath   The history is provided by the patient. No language interpreter was used.     HPI Comments: Traci [name] is a 69 y.o. female with past medical history of CAD, HTN, HLD brought in by ambulance, who presents to the Emergency Department complaining of a few days of intermittent SOB that worsened a few hours ago. She reports having SOB with walking, lying flat, and lying on her right side. She has associated bilateral shoulder blade pain and left sided rib pain which is unchanged with deep breathing. She currently takes Plavix. She denies history of DVT/PE. She has abdominal bloating, nausea, and epigastric/lower chest pain which she has at baseline. She has a history of abdominal aortic aneurysm repair by Dr. Scot Dock in August 2015. She denies fever, hematochezia, melena.  Reports mild cough  Past Medical History  Diagnosis Date  . Hypertension 11/30/2010    echo- EF 55%; normal w/ mildly sclerotic aortic valve  . Hyperlipidemia   . Coronary artery disease   . GERD (gastroesophageal reflux disease)   . Osteopenia   . Sigmoid diverticulitis   . Peripheral vascular disease   . Atrial fibrillation   . Nonspecific ST-T wave electrocardiographic changes 03/26/2011    R/Lmv - EF 74%, normal perfusion all regions, ST depression w/ Lexiscan infusion w/o assoc angina  . CAD (coronary artery disease)   . PAD (peripheral artery disease)   . PVD (peripheral vascular disease) 11/05/2011    doppler - R/L brachial pressures essentially equal w/o inflow disease; L sublclavian/CCA bypass graft demonstrates patent flow, no evidence of significant stenosis   . Hypertension 11/05/11    renal dopplers - celiac artery and SMA >50% diameter reduction, R renal artery - mildly elevated velocities 1-59% diameter reduction, L renal artery normal  . Claudication 01/06/2008    Lower extremity dopplers - no evidence of arterial insufficiency, normal exam  . Anxiety    Past Surgical History  Procedure Laterality Date  . Cholecystectomy    . Appendectomy    . Abdominal hysterectomy    . Subclavian artery stents  01/23/2010    Left carotid to subclavian artery Bypass  . Spine surgery  Feb. 2011  . Subclavian artery stent  1995    X's  several  . Subclavian artery stent Left 09/20/2008    LSA ISR 7x32mm Cordis Genesis on Opta premount, reduction from 80% to 0%  . Cardiac catheterization  06/03/2008    60% LAD, involving D2, borderline significant by IVUS, medical therapy. CFX, RCA OK.  . Eye surgery      retinal surgery  . Abdominal aortic aneurysm repair N/A 01/27/2014    Procedure: AORTO-SUPERIOR MESENTERIC ARTERY BYPASS GRAFT;  Surgeon: Angelia Mould, MD;  Location: [facility];  Service: Vascular;  Laterality: N/A;  . Visceral angiogram  01/17/2014    Procedure: VISCERAL ANGIOGRAM;  Surgeon: Angelia Mould, MD;  Location: [facility] C[facility] LAB;  Service: Cardiovascular;;   Family History  Problem Relation Age of Onset  . Heart disease Father     Heart Disease before age 67  . Kidney disease Father   . Heart attack Father   .  Hyperlipidemia Father   . Hypertension Father   . Diabetes Mother     Varicose Veins  . Diabetes Maternal Grandmother   . Heart disease Paternal Grandfather    Social History  Substance Use Topics  . Smoking status: Former Smoker    Types: Cigarettes    Quit date: 09/30/1993  . Smokeless tobacco: Never Used  . Alcohol Use: No   OB History    No data available     Review of Systems  Constitutional: Negative for fever.  Respiratory: Positive for shortness of breath. Negative for cough.   Gastrointestinal: Positive  for nausea and abdominal pain. Negative for blood in stool and anal bleeding.  All other systems reviewed and are negative.     Allergies  Cortisone; Dilaudid; Iodine; Medrol; Omnipaque; Prednisone; Shellfish allergy; Sulfa drugs cross reactors; Augmentin; Morphine and related; Codeine; and Erythromycin  Home Medications   Prior to Admission medications   Medication Sig Start Date End Date Taking? Authorizing Provider  aspirin EC 81 MG tablet Take 81 mg by mouth daily.    Historical Provider, MD  atenolol (TENORMIN) 25 MG tablet Take 1 tablet (25 mg total) by mouth daily. 02/22/15   Mihai Croitoru, MD  atorvastatin (LIPITOR) 10 MG tablet Take 1 tablet (10 mg total) by mouth every evening. 02/22/15   Mihai Croitoru, MD  candesartan (ATACAND) 16 MG tablet Take 0.5 tablets (8 mg total) by mouth every morning. 02/22/15   Mihai Croitoru, MD  cefdinir (OMNICEF) 300 MG capsule Take 1 capsule (300 mg total) by mouth 2 (two) times daily. 03/06/15 03/16/15  Marcial Pacas, DO  cholecalciferol (VITAMIN D) 1000 UNITS tablet Take 1,000 Units by mouth daily.    Historical Provider, MD  clopidogrel (PLAVIX) 75 MG tablet Take 1 tablet (75 mg total) by mouth daily. 02/22/15   Mihai Croitoru, MD  clorazepate (TRANXENE) 7.5 MG tablet Take 1 tablet (7.5 mg total) by mouth daily. 02/16/15   Sean Hommel, DO  cyanocobalamin 1000 MCG tablet Take 100 mcg by mouth daily.    Historical Provider, MD  EPINEPHrine (EPIPEN 2-PAK) 0.3 mg/0.3 mL DEVI Inject 0.3 mg into the muscle as needed (for allergic reaction).    Historical Provider, MD  esomeprazole (NEXIUM) 40 MG capsule Take 1 capsule (40 mg total) by mouth daily. Take 1 tab daily 02/23/15   Mihai Croitoru, MD  estradiol (ESTRACE) 0.5 MG tablet Take 1 tablet (0.5 mg total) by mouth daily. 02/21/15   Sean Hommel, DO  fexofenadine (ALLEGRA) 180 MG tablet Take 180 mg by mouth daily.    Historical Provider, MD  furosemide (LASIX) 20 MG tablet Take 1 tablet (20 mg total) by mouth every  other day. 02/22/15   Mihai Croitoru, MD  HYDROcodone-acetaminophen (NORCO/VICODIN) 5-325 MG per tablet Take 1 tablet by mouth every 4 (four) hours as needed. 03/05/15   Tanna Furry, MD  ipratropium (ATROVENT) 0.06 % nasal spray Place 2 sprays into both nostrils 4 (four) times daily. 08/20/14   Gregor Hams, MD  meclizine (ANTIVERT) 25 MG tablet Take 0.5 tablets (12.5 mg total) by mouth 3 (three) times daily as needed for dizziness or nausea. 02/16/15   Marcial Pacas, DO  Multiple Vitamin (MULTIVITAMIN WITH MINERALS) TABS Take 1 tablet by mouth daily.    Historical Provider, MD  polyethylene glycol (MIRALAX / GLYCOLAX) packet Take 17 g by mouth 2 (two) times daily. Until stooling regularly 11/09/14   Silverio Decamp, MD  promethazine (PHENERGAN) 25 MG tablet Take 1 tablet (  25 mg total) by mouth every 8 (eight) hours as needed for nausea or vomiting. 02/06/15   Marcial Pacas, DO  zolpidem (AMBIEN) 10 MG tablet Take 10 mg by mouth at bedtime as needed for sleep.    Historical Provider, MD   There were no vitals taken for this visit. Physical Exam  Constitutional: She is oriented to person, place, and time. She appears well-developed and well-nourished. No distress.  HENT:  Head: Normocephalic and atraumatic.  Eyes: EOM are normal.  Neck: Normal range of motion.  Cardiovascular: Normal rate, regular rhythm and normal heart sounds.   Pulmonary/Chest: Effort normal and breath sounds normal. No respiratory distress. She has no wheezes. She has no rales.  Abdominal: Soft. She exhibits no distension. There is no tenderness.  Musculoskeletal: Normal range of motion.  Neurological: She is alert and oriented to person, place, and time.  Skin: Skin is warm and dry.  Psychiatric: She has a normal mood and affect. Judgment normal.  Nursing note and vitals reviewed.   ED Course  Procedures (including critical care time)  DIAGNOSTIC STUDIES: Oxygen Saturation is 100% on RA, normal by my interpretation.     COORDINATION OF CARE: 12:45 AM Will order labs and CXR. Discussed treatment plan with pt at bedside and pt agreed to plan.   Labs Review Labs Reviewed  CBC WITH DIFFERENTIAL/PLATELET - Abnormal; Notable for the following:    Hemoglobin 11.7 (*)    HCT 34.7 (*)    All other components within normal limits  COMPREHENSIVE METABOLIC PANEL - Abnormal; Notable for the following:    Glucose, Bld 120 (*)    Calcium 8.5 (*)    GFR calc non Af Amer 58 (*)    All other components within normal limits  TROPONIN I  BRAIN NATRIURETIC PEPTIDE  D-DIMER, QUANTITATIVE (NOT AT [facility])    Imaging Review Dg Chest 2 View  03/12/2015   CLINICAL DATA:  Shortness of breath tonight. Hypertension. Nonsmoker.  EXAM: CHEST  2 VIEW  COMPARISON:  01/28/2014  FINDINGS: Linear fibrosis in the lung bases. Hyperinflation may indicate emphysema. Vascular graft in the left mediastinum. Surgical clips in the left side of the neck. No focal airspace disease or consolidation in the lungs. No blunting of costophrenic angles. No pneumothorax. Heart size and pulmonary vascularity are normal.  IMPRESSION: Probable emphysematous changes and chronic bronchitic changes in the lungs. No evidence of active pulmonary disease.   Electronically Signed   By: Lucienne Capers M.D.   On: 03/12/2015 02:30   I have personally reviewed and evaluated these images and lab results as part of my medical decision-making.   EKG Interpretation   Date/Time:  Sunday March 12 2015 01:46:46 EDT Ventricular Rate:  62 PR Interval:  157 QRS Duration: 86 QT Interval:  423 QTC Calculation: 429 R Axis:   70 Text Interpretation:  Sinus rhythm Anteroseptal infarct, old No  significant change was found Confirmed by CAMPOS  MD, KEVIN ([id number]) on  03/12/2015 6:32:32 AM      MDM   Final diagnoses:  None   6:32 AM Patient feels much better this time.  She and later on emergency department without any difficulty.  This may represent bronchitis  with bronchospasm.  No wheezing on examination but she feels much better at this time.  She'll be given albuterol inhaler to take home with her.  D-dimer is negative.  BNP is normal.  Doubt acute coronary syndrome.  EKG is normal sinus rhythm without ischemic changes.  I personally ambulated with the patient around the emergency department.  She stated she felt much better.  She had no complaints of shortness of breath with ambulation.  No increased work of breathing.  We spoke the entire way as we are walking down the long hallway and she had no shortening of her speech pattern.   I personally performed the services described in this documentation, which was scribed in my presence. The recorded information has been reviewed and is accurate.      Jola Schmidt, MD 03/12/15 [phone]

## 2015-03-20 ENCOUNTER — Ambulatory Visit: Payer: Medicare Other | Admitting: Family Medicine

## 2015-03-27 ENCOUNTER — Ambulatory Visit: Payer: Medicare Other | Admitting: Family Medicine

## 2015-04-04 ENCOUNTER — Ambulatory Visit: Payer: Medicare Other | Admitting: Family Medicine

## 2015-04-05 ENCOUNTER — Ambulatory Visit (INDEPENDENT_AMBULATORY_CARE_PROVIDER_SITE_OTHER): Payer: Medicare Other | Admitting: Family Medicine

## 2015-04-05 ENCOUNTER — Encounter: Payer: Self-pay | Admitting: Family Medicine

## 2015-04-05 VITALS — BP 121/56 | HR 63 | Wt 147.0 lb

## 2015-04-05 DIAGNOSIS — L820 Inflamed seborrheic keratosis: Secondary | ICD-10-CM | POA: Diagnosis not present

## 2015-04-05 DIAGNOSIS — I701 Atherosclerosis of renal artery: Secondary | ICD-10-CM | POA: Diagnosis not present

## 2015-04-05 DIAGNOSIS — L821 Other seborrheic keratosis: Secondary | ICD-10-CM

## 2015-04-05 MED ORDER — ZOLPIDEM TARTRATE 10 MG PO TABS: 10.0000 mg | ORAL_TABLET | Freq: Every evening | ORAL | Status: DC | PRN

## 2015-04-05 NOTE — Progress Notes (Signed)
CC: Traci [name] is a 69 y.o. female is here for check spot on back   Subjective: HPI:   pigmented lesion on the left flank, it was originally removed almost a decade ago with  A scalpel. That slowly returning. It's painful when wearing a bra  Or a tightfitting shirt. Pain is nonradiating and described as a burning. It's painless if nothing is touching it. Symptoms are mild in severity. Other than above-noted interventions as of yet. She believes it's growing. Symptoms are present on a daily basis. Denies fevers, chills, nor skin changes elsewhere. Denies any swollen lymph nodes or unintentional weight loss   Review Of Systems Outlined In HPI  Past Medical History  Diagnosis Date  . Hypertension 11/30/2010    echo- EF 55%; normal w/ mildly sclerotic aortic valve  . Hyperlipidemia   . Coronary artery disease   . GERD (gastroesophageal reflux disease)   . Osteopenia   . Sigmoid diverticulitis   . Peripheral vascular disease ([place])   . Atrial fibrillation ([place])   . Nonspecific ST-T wave electrocardiographic changes 03/26/2011    R/Lmv - EF 74%, normal perfusion all regions, ST depression w/ Lexiscan infusion w/o assoc angina  . CAD (coronary artery disease)   . PAD (peripheral artery disease) ([place])   . PVD (peripheral vascular disease) ([place]) 11/05/2011    doppler - R/L brachial pressures essentially equal w/o inflow disease; L sublclavian/CCA bypass graft demonstrates patent flow, no evidence of significant stenosis  . Hypertension 11/05/11    renal dopplers - celiac artery and SMA >50% diameter reduction, R renal artery - mildly elevated velocities 1-59% diameter reduction, L renal artery normal  . Claudication ([place]) 01/06/2008    Lower extremity dopplers - no evidence of arterial insufficiency, normal exam  . Anxiety     Past Surgical History  Procedure Laterality Date  . Cholecystectomy    . Appendectomy    . Abdominal hysterectomy    . Subclavian artery stents  01/23/2010     Left carotid to subclavian artery Bypass  . Spine surgery  Feb. 2011  . Subclavian artery stent  1995    X's  several  . Subclavian artery stent Left 09/20/2008    LSA ISR 7x30mm Cordis Genesis on Opta premount, reduction from 80% to 0%  . Cardiac catheterization  06/03/2008    60% LAD, involving D2, borderline significant by IVUS, medical therapy. CFX, RCA OK.  . Eye surgery      retinal surgery  . Abdominal aortic aneurysm repair N/A 01/27/2014    Procedure: AORTO-SUPERIOR MESENTERIC ARTERY BYPASS GRAFT;  Surgeon: Angelia Mould, MD;  Location: [facility];  Service: Vascular;  Laterality: N/A;  . Visceral angiogram  01/17/2014    Procedure: VISCERAL ANGIOGRAM;  Surgeon: Angelia Mould, MD;  Location: [facility] CATH LAB;  Service: Cardiovascular;;   Family History  Problem Relation Age of Onset  . Heart disease Father     Heart Disease before age 56  . Kidney disease Father   . Heart attack Father   . Hyperlipidemia Father   . Hypertension Father   . Diabetes Mother     Varicose Veins  . Diabetes Maternal Grandmother   . Heart disease Paternal Grandfather     Social History   Social History  . Marital Status: Legally Separated    Spouse Name: N/A  . Number of Children: N/A  . Years of Education: N/A   Occupational History  . Not on file.   Social History  Main Topics  . Smoking status: Former Smoker    Types: Cigarettes    Quit date: 09/30/1993  . Smokeless tobacco: Never Used  . Alcohol Use: No  . Drug Use: No  . Sexual Activity: Not on file   Other Topics Concern  . Not on file   Social History Narrative     Objective: BP 121/56 mmHg  Pulse 63  Wt 147 lb (66.679 kg)  Vital signs reviewed. General: Alert and Oriented, No Acute Distress HEENT: Pupils equal, round, reactive to light. Conjunctivae clear.  External ears unremarkable.  Moist mucous membranes. Lungs: Clear and comfortable work of breathing, speaking in full sentences without accessory muscle  use. Cardiac: Regular rate and rhythm.  Neuro: CN II-XII grossly intact, gait normal. Extremities: No peripheral edema.  Strong peripheral pulses.  Mental Status: No depression, anxiety, nor agitation. Logical though process. Skin: Warm and dry. On the left flank there is a 1 cm diameter slightly raised fleshy waxy stuck on appearing lesion.  Assessment & Plan: Traci [name] was seen today for check spot on back.  Diagnoses and all orders for this visit:  Seborrheic keratoses  Other orders -     zolpidem (AMBIEN) 10 MG tablet; Take 1 tablet (10 mg total) by mouth at bedtime as needed for sleep.   Seborrheic keratosis: She would like this to be removed, seems reasonable given that is causing her some pain. She is agreeable to cryotherapy. If this does not result destruction after 2 weeks I be more than happy to remove it with a scalpel after numbing the skin.  She is also requesting refills on Ambien to be sent to her mail order pharmacy.  Cryotherapy Procedure Note  Pre-operative Diagnosis: inflammed SK  Post-operative Diagnosis: same  Locations: left flank Indications: pain  Anesthesia: none  Procedure Details  History of allergy to iodine: no. Pacemaker? no.  Patient informed of risks (permanent scarring, infection, light or dark discoloration, bleeding, infection, weakness, numbness and recurrence of the lesion) and benefits of the procedure and verbal informed consent obtained.  The areas are treated with liquid nitrogen therapy, frozen until ice ball extended 2 mm beyond lesion, allowed to thaw, and treated again. The patient tolerated procedure well.  The patient was instructed on post-op care, warned that there may be blister formation, redness and pain. Recommend OTC analgesia as needed for pain.  Condition: Stable  Complications: none.  Plan: 1. Instructed to keep the area dry and covered for 24-48h and clean thereafter. 2. Warning signs of infection were reviewed.    3. Recommended that the patient use OTC analgesics as needed for pain.  4. ReturnPRN    Return if symptoms worsen or fail to improve.

## 2015-04-20 ENCOUNTER — Telehealth: Payer: Self-pay

## 2015-04-20 ENCOUNTER — Telehealth: Payer: Self-pay | Admitting: Cardiovascular Disease

## 2015-04-20 ENCOUNTER — Other Ambulatory Visit: Payer: Self-pay

## 2015-04-20 DIAGNOSIS — R600 Localized edema: Secondary | ICD-10-CM

## 2015-04-20 MED ORDER — ZOLPIDEM TARTRATE 10 MG PO TABS: 10.0000 mg | ORAL_TABLET | Freq: Every evening | ORAL | Status: DC | PRN

## 2015-04-20 NOTE — Telephone Encounter (Signed)
Spoke with pt, dopplers scheduled.

## 2015-04-20 NOTE — Telephone Encounter (Signed)
Spoke with pt, she recently went on a long road trip. She developed a bruise on the back of her knee. For the last 3 days that area is burning, red and painful. She reports her foot is cold and her toes look whiter than her right foot, there is swelling in that foot. She has a follow up appt with dr croitoru in a couple weeks. Discussed with luke kilroy pa-c. Will schedule venous dopplers for DVT.

## 2015-04-20 NOTE — Telephone Encounter (Signed)
Pt repots that she had a bruise area on her leg that is no longer there but now her leg is red, purple, burning and stinging.  Should she call her vascular doctor about this problem or is this something you could assist with?

## 2015-04-20 NOTE — Telephone Encounter (Signed)
Pt is returning Nathans call  ° °Thanks  °

## 2015-04-20 NOTE — Telephone Encounter (Signed)
Pt advised.

## 2015-04-20 NOTE — Telephone Encounter (Signed)
Yes but if for some reason she can't be seen by them by tomorrow I'd be happy to evaluate her to see if a ultrasound is needed.

## 2015-04-20 NOTE — Telephone Encounter (Signed)
Traci [name] is calling because she has a bruise that is burning , feel like needles in her leg and hurts . Please call   Thanks

## 2015-04-20 NOTE — Telephone Encounter (Signed)
LMTCB

## 2015-04-21 ENCOUNTER — Ambulatory Visit (HOSPITAL_COMMUNITY)
Admission: RE | Admit: 2015-04-21 | Discharge: 2015-04-21 | Disposition: A | Discharge status: Home / Self Care | Payer: Medicare Other | Source: Ambulatory Visit | Attending: Cardiology | Admitting: Cardiology

## 2015-04-21 DIAGNOSIS — I10 Essential (primary) hypertension: Secondary | ICD-10-CM | POA: Diagnosis not present

## 2015-04-21 DIAGNOSIS — R600 Localized edema: Secondary | ICD-10-CM

## 2015-04-21 DIAGNOSIS — I2510 Atherosclerotic heart disease of native coronary artery without angina pectoris: Secondary | ICD-10-CM | POA: Insufficient documentation

## 2015-04-21 DIAGNOSIS — I739 Peripheral vascular disease, unspecified: Secondary | ICD-10-CM | POA: Insufficient documentation

## 2015-04-21 DIAGNOSIS — E785 Hyperlipidemia, unspecified: Secondary | ICD-10-CM | POA: Insufficient documentation

## 2015-04-26 IMAGING — CR DG ABD PORTABLE 1V
1 series · 1 of 1 positions shown · non-contrast
Comparison: None.

CLINICAL DATA: Abdominal pain

EXAM:
PORTABLE ABDOMEN - 1 VIEW

[AP]
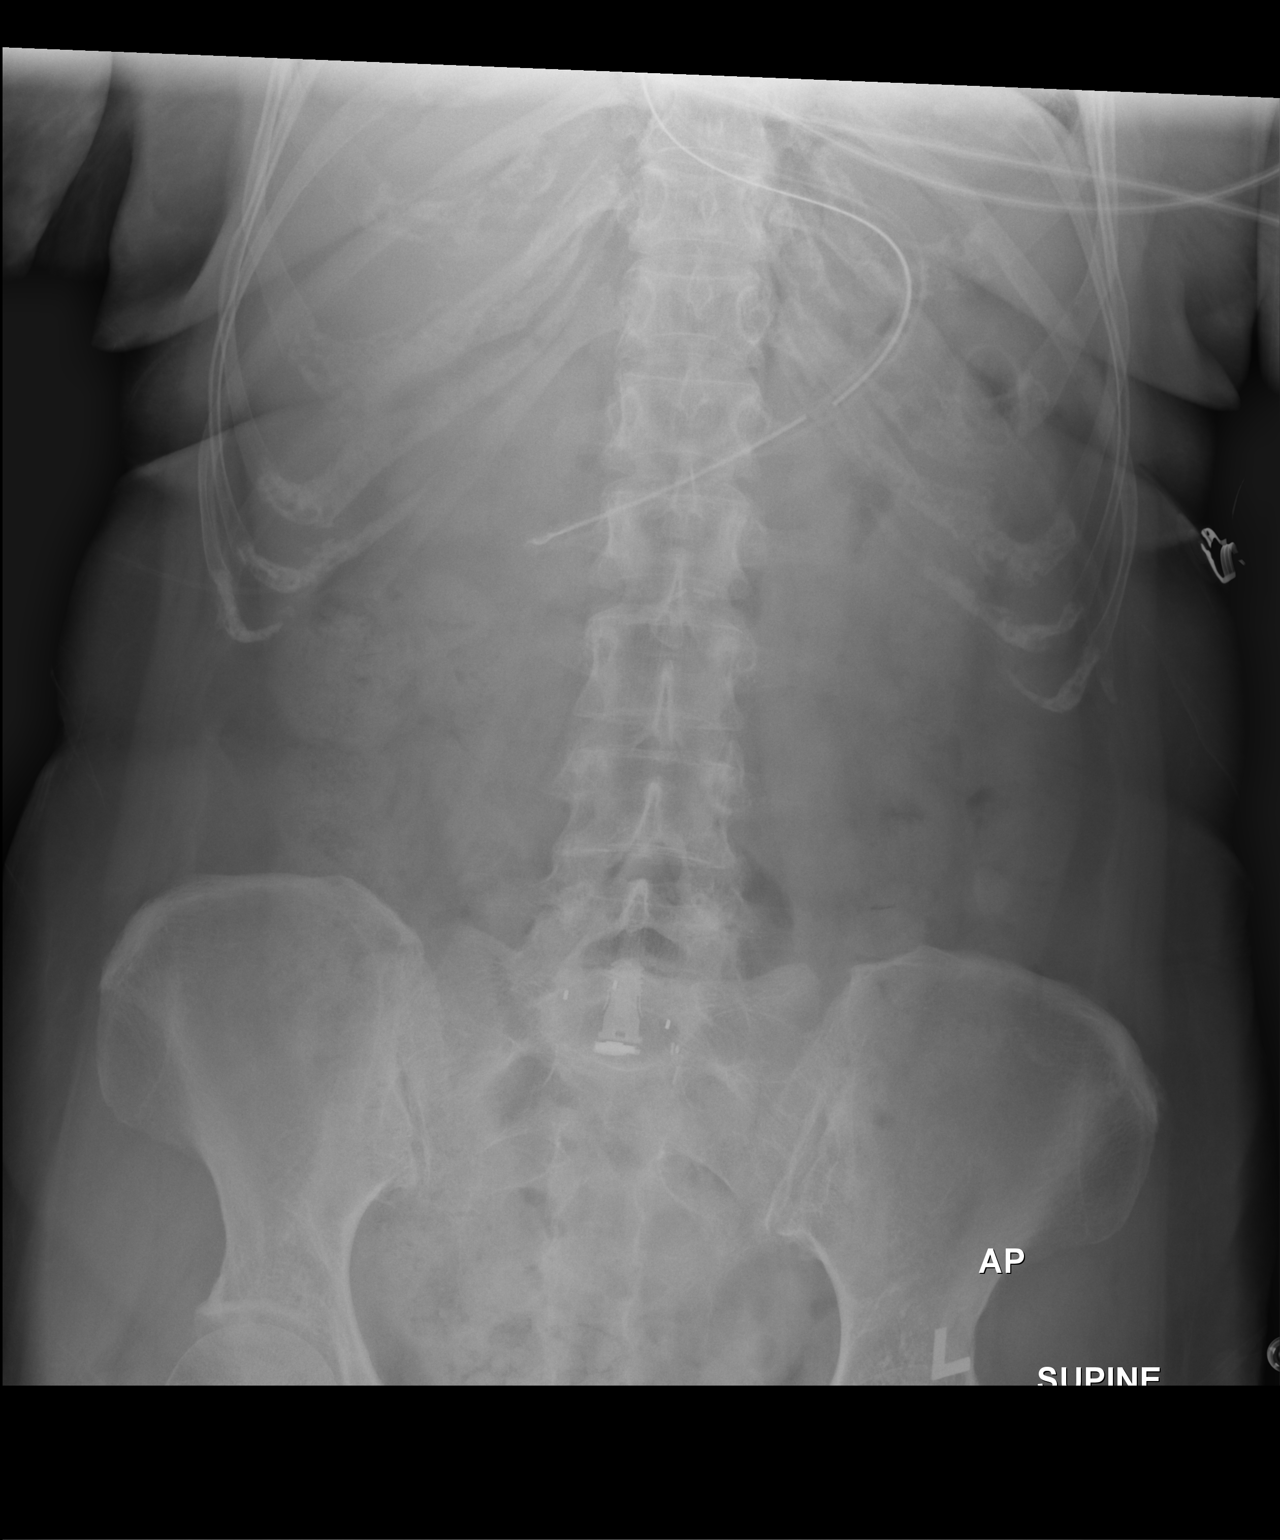

[1 of 1 positions shown; findings below may reference images not displayed]

FINDINGS: A nasogastric catheter is noted within the stomach. The bowel gas
pattern is within normal limits. Postsurgical changes are seen in
the lumbar spine no free air is seen.
IMPRESSION: No acute abnormality noted.

## 2015-04-27 IMAGING — CR DG CHEST 1V PORT
1 series · 1 of 1 positions shown · non-contrast
Comparison: January 27, 2014

CLINICAL DATA: Postoperative evaluation; hypertension

EXAM:
PORTABLE CHEST - 1 VIEW

[AP]
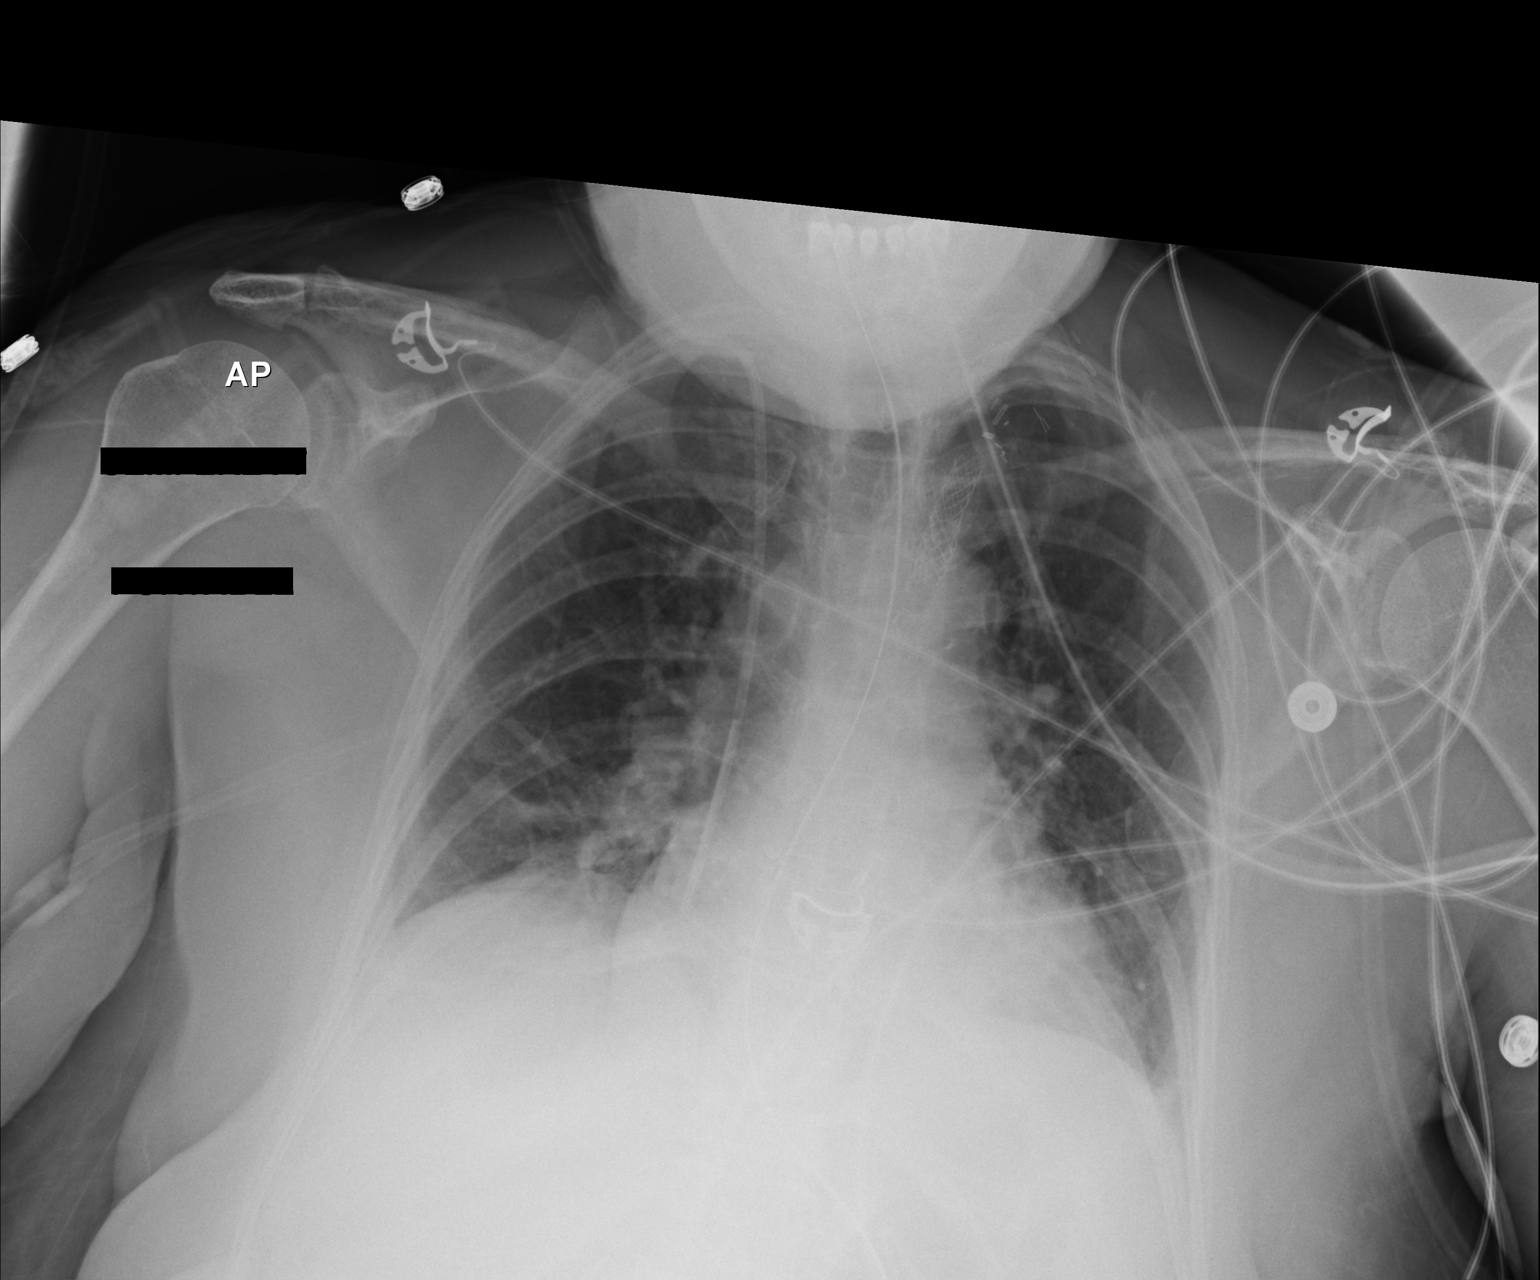

[1 of 1 positions shown; findings below may reference images not displayed]

FINDINGS: Central catheter tip is in the right atrium. Nasogastric tube tip
and side port are below the diaphragm. No pneumothorax. There is
mild bibasilar atelectatic change. Lungs are otherwise clear. Heart
is slightly enlarged with pulmonary vascularity within normal
limits. There is a stent in the left innominate vein region, stable.
No adenopathy.
IMPRESSION: Mild bibasilar atelectatic change. Lungs otherwise clear. Tube and
catheter positions as described without pneumothorax.

## 2015-04-28 ENCOUNTER — Ambulatory Visit: Payer: Medicare Other | Admitting: Physician Assistant

## 2015-05-04 DIAGNOSIS — Z1211 Encounter for screening for malignant neoplasm of colon: Secondary | ICD-10-CM | POA: Diagnosis not present

## 2015-05-04 DIAGNOSIS — K581 Irritable bowel syndrome with constipation: Secondary | ICD-10-CM | POA: Diagnosis not present

## 2015-05-09 ENCOUNTER — Encounter: Payer: Self-pay | Admitting: Physician Assistant

## 2015-05-09 ENCOUNTER — Ambulatory Visit (INDEPENDENT_AMBULATORY_CARE_PROVIDER_SITE_OTHER): Payer: Medicare Other | Admitting: Physician Assistant

## 2015-05-09 VITALS — BP 132/72 | HR 64 | Ht 64.0 in | Wt 148.2 lb

## 2015-05-09 DIAGNOSIS — I10 Essential (primary) hypertension: Secondary | ICD-10-CM

## 2015-05-09 DIAGNOSIS — I701 Atherosclerosis of renal artery: Secondary | ICD-10-CM

## 2015-05-09 DIAGNOSIS — I739 Peripheral vascular disease, unspecified: Secondary | ICD-10-CM

## 2015-05-09 NOTE — Patient Instructions (Signed)
Your physician has requested that you have a lower extremity arterial duplex. This test is an ultrasound of the arteries in the legs. It looks at arterial blood flow in the legs. Allow one hour for Lower Arterial scans. There are no restrictions or special instructions  Your physician has recommended you make the following change in your medication: TAKE atacand @ lunch  Keep appointment with Dr. Sallyanne Kuster

## 2015-05-09 NOTE — Progress Notes (Signed)
Cardiology Office Note   Date:  05/09/2015   ID:  Traci [name], DOB [birth year], MRN [id number]  PCP:  Marcial Pacas, DO  Cardiologist:  Dr Juanetta Snow, PA-C   Chief Complaint  Patient presents with  . Follow-up    no chest pain, occassional shortness of breath, no edema, has pain in legs, occassional cramping in legs, occassional lightheadedness, occassional dizziness    History of Present Illness: Traci [name] is a 69 y.o. 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   [name] presents for evaluation of left lower extremity pain.  She gets this pain every night. She gets it on both sides of her legs separately. On the left side, the pain starts at the head of the fibula and radiates down into the left side of the foot to the toes. On the right side, the pain starts at the medial epicondyle of the tibia and radiates down into her foot all the way to the toes on the right side.   There is no cramping in her calf or foot. There is no numbness. Her foot does not get cold. She has not noted any pallor. The symptoms happened mostly at night when she is trying to go to bed. She has tried multiple positions and pillows as well as over-the-counter creams and rubs without any help. The pain can be severe and is a stinging or burning pain.  She is here today because she is concerned that the symptoms are coming from poor circulation to her leg.  She also has occasional dizziness. The dizziness can start at rest. When she has felt dizzy, she has taken her blood pressure at times and her diastolic will be [id number] with a systolic [id number] or greater. She tends to get these symptoms in the evening, an hour or 2 after taking her candesartan.  Past Medical History  Diagnosis Date  . Hypertension 11/30/2010    echo- EF 55%; normal w/  mildly sclerotic aortic valve  . Hyperlipidemia   . Coronary artery disease   . GERD (gastroesophageal reflux disease)   . Osteopenia   . Sigmoid diverticulitis   . Peripheral vascular disease ([place])   . Atrial fibrillation ([place])   . Nonspecific ST-T wave electrocardiographic changes 03/26/2011    R/Lmv - EF 74%, normal perfusion all regions, ST depression w/ Lexiscan infusion w/o assoc angina  . CAD (coronary artery disease)   . PAD (peripheral artery disease) ([place])   . PVD (peripheral vascular disease) ([place]) 11/05/2011    doppler - R/L brachial pressures essentially equal w/o inflow disease; L sublclavian/CCA bypass graft demonstrates patent flow, no evidence of significant stenosis  . Hypertension 11/05/11    renal dopplers - celiac artery and SMA >50% diameter reduction, R renal artery - mildly elevated velocities 1-59% diameter reduction, L renal artery normal  . Claudication ([place]) 01/06/2008    Lower extremity dopplers - no evidence of arterial insufficiency, normal exam  . Anxiety     Past Surgical History  Procedure Laterality Date  . Cholecystectomy    . Appendectomy    . Abdominal hysterectomy    . Subclavian artery stents  01/23/2010    Left carotid to subclavian artery Bypass  . Spine surgery  Feb. 2011  . Subclavian artery stent  1995    X's  several  .  Subclavian artery stent Left 09/20/2008    LSA ISR 7x29mm Cordis Genesis on Opta premount, reduction from 80% to 0%  . Cardiac catheterization  06/03/2008    60% LAD, involving D2, borderline significant by IVUS, medical therapy. CFX, RCA OK.  . Eye surgery      retinal surgery  . Abdominal aortic aneurysm repair N/A 01/27/2014    Procedure: AORTO-SUPERIOR MESENTERIC ARTERY BYPASS GRAFT;  Surgeon: Angelia Mould, MD;  Location: [facility];  Service: Vascular;  Laterality: N/A;  . Visceral angiogram  01/17/2014    Procedure: VISCERAL ANGIOGRAM;  Surgeon: Angelia Mould, MD;  Location: [facility] CATH LAB;  Service:  Cardiovascular;;    Current Outpatient Prescriptions  Medication Sig Dispense Refill  . aspirin EC 81 MG tablet Take 81 mg by mouth daily.    Marland Kitchen atenolol (TENORMIN) 25 MG tablet Take 1 tablet (25 mg total) by mouth daily. 90 tablet 2  . atorvastatin (LIPITOR) 10 MG tablet Take 1 tablet (10 mg total) by mouth every evening. 90 tablet 2  . candesartan (ATACAND) 16 MG tablet Take 0.5 tablets (8 mg total) by mouth every morning. 45 tablet 2  . cholecalciferol (VITAMIN D) 1000 UNITS tablet Take 1,000 Units by mouth daily.    . clopidogrel (PLAVIX) 75 MG tablet Take 1 tablet (75 mg total) by mouth daily. 90 tablet 2  . clorazepate (TRANXENE) 7.5 MG tablet Take 1 tablet (7.5 mg total) by mouth daily. 90 tablet 1  . cyanocobalamin 1000 MCG tablet Take 100 mcg by mouth daily.    Marland Kitchen EPINEPHrine (EPIPEN 2-PAK) 0.3 mg/0.3 mL DEVI Inject 0.3 mg into the muscle as needed (for allergic reaction).    Marland Kitchen esomeprazole (NEXIUM) 40 MG capsule Take 1 capsule (40 mg total) by mouth daily. Take 1 tab daily 90 capsule 0  . estradiol (ESTRACE) 0.5 MG tablet Take 1 tablet (0.5 mg total) by mouth daily. 90 tablet 1  . fexofenadine (ALLEGRA) 180 MG tablet Take 180 mg by mouth daily.    . furosemide (LASIX) 20 MG tablet Take 1 tablet (20 mg total) by mouth every other day. 45 tablet 2  . meclizine (ANTIVERT) 25 MG tablet Take 0.5 tablets (12.5 mg total) by mouth 3 (three) times daily as needed for dizziness or nausea. 90 tablet 4  . Multiple Vitamin (MULTIVITAMIN WITH MINERALS) TABS Take 1 tablet by mouth daily.    . polyethylene glycol (MIRALAX / GLYCOLAX) packet Take 17 g by mouth 2 (two) times daily. Until stooling regularly 30 packet 11  . zolpidem (AMBIEN) 10 MG tablet Take 1 tablet (10 mg total) by mouth at bedtime as needed for sleep. 30 tablet 2   No current facility-administered medications for this visit.    Allergies:   Cortisone; Dilaudid; Iodine; Medrol; Omnipaque; Prednisone; Shellfish allergy; Sulfa drugs  cross reactors; Augmentin; Morphine and related; Codeine; and Erythromycin    Social History:  The patient  reports that she quit smoking about 21 years ago. Her smoking use included Cigarettes. She has never used smokeless tobacco. She reports that she does not drink alcohol or use illicit drugs.   Family History:  The patient's family history includes Diabetes in her maternal grandmother and mother; Heart attack in her father; Heart disease in her father and paternal grandfather; Hyperlipidemia in her father; Hypertension in her father; Kidney disease in her father.  ROS:  Please see the history of present illness. All other systems are reviewed and negative.    PHYSICAL EXAM: VS:  BP 132/72 mmHg  Pulse 64  Ht 5\' 4"  (1.626 m)  Wt 148 lb 3 oz (67.217 kg)  BMI 25.42 kg/m2 , BMI Body mass index is 25.42 kg/(m^2). GEN: Well nourished, well developed, female in no acute distress HEENT: normal for age  Neck: no JVD, bilateral carotid bruits, no masses; subclavian bruit noted Cardiac: RRR; minimal murmur, no rubs, or gallops Respiratory:  clear to auscultation bilaterally, normal work of breathing GI: soft, nontender, nondistended, + BS MS: no deformity or atrophy; no edema; distal pulses are 2+ in all 4 extremities. PT pulses are stronger than DP pulses but all are easily palpable. There is some minor tenderness in the area where she has the pain in her legs every night. Skin: warm and dry, no rash Neuro:  Strength and sensation are intact Psych: euthymic mood, full affect   EKG:  EKG is not ordered today.  Recent Labs: 03/12/2015: ALT 21; B Natriuretic Peptide 64.6; BUN 14; Creatinine, Ser 0.98; Hemoglobin 11.7*; Platelets 268; Potassium 4.3; Sodium 138    Lipid Panel    Component Value Date/Time   CHOL 145 09/20/2014 0921   CHOL 141 04/07/2013 0857   TRIG 145 09/20/2014 0921   HDL 48 09/20/2014 0921   HDL 47 04/07/2013 0857   CHOLHDL 3.0 09/20/2014 0921   CHOLHDL 3.0 04/07/2013  0857   VLDL 29 09/20/2014 0921   LDLCALC 68 09/20/2014 0921   LDLCALC 67 04/07/2013 0857     Wt Readings from Last 3 Encounters:  05/09/15 148 lb 3 oz (67.217 kg)  04/05/15 147 lb (66.679 kg)  03/06/15 147 lb (66.679 kg)     Other studies Reviewed: Additional studies/ records that were reviewed today include: MRA, office notes and hospital records.  ASSESSMENT AND PLAN:  1.  Left lower extremity pain pump, possible claudication: Lower extremity arterial Dopplers were last performed in 2014 and were normal then. Her pulses are easily palpable. I advised that the likelihood of her having clinically significant claudication was low, but because of her long history of vascular disease, we will order lower extremity arterial duplex and follow up on these results.  She is encouraged to pursue nonvascular avenues for this pain including contacting Dr. Vertell Limber (who did her back surgery) to see if he feels this might be a neuro surgical problem or if an orthopedic M.D. might be of more help.  2. Episodic lightheaded feeling: This is not clearly orthostatic in nature. It happens a couple of hours after taking her ARB. She has no such symptoms with her other blood pressure medications including atenolol and Lasix. She is currently taking the Antivert and Tranxene only once a day in the morning. She is to switch the candesartan to lunch time. She is already taking only one half tablet. If this is not effective, we will decrease the dose to 4 mg daily and see how this is tolerated. For now, we will just change the timing of the medication as her blood pressure is not low.  Current medicines are reviewed at length with the patient today.  The patient does not have concerns regarding medicines.  The following changes have been made:  Change the timing of the attic hand, no dose change  Labs/ tests ordered today include:   lower extremity arterial Dopplers  Disposition:   FU with Dr. Sallyanne Kuster as  scheduled in January  Signed, Zamirah Denny, Hershal Coria  05/09/2015 2:28 PM    [address],  [place], [place]  [address] Phone: [phone]; Fax: [phone]

## 2015-05-14 ENCOUNTER — Emergency Department (HOSPITAL_BASED_OUTPATIENT_CLINIC_OR_DEPARTMENT_OTHER): Payer: Medicare Other

## 2015-05-14 ENCOUNTER — Encounter (HOSPITAL_BASED_OUTPATIENT_CLINIC_OR_DEPARTMENT_OTHER): Payer: Self-pay | Admitting: *Deleted

## 2015-05-14 ENCOUNTER — Emergency Department (HOSPITAL_BASED_OUTPATIENT_CLINIC_OR_DEPARTMENT_OTHER)
Admission: EM | Admit: 2015-05-14 | Discharge: 2015-05-15 | Disposition: A | Discharge status: Home / Self Care | Payer: Medicare Other | Attending: Emergency Medicine | Admitting: Emergency Medicine

## 2015-05-14 DIAGNOSIS — I2510 Atherosclerotic heart disease of native coronary artery without angina pectoris: Secondary | ICD-10-CM | POA: Diagnosis not present

## 2015-05-14 DIAGNOSIS — R1010 Upper abdominal pain, unspecified: Secondary | ICD-10-CM | POA: Diagnosis not present

## 2015-05-14 DIAGNOSIS — I10 Essential (primary) hypertension: Secondary | ICD-10-CM | POA: Insufficient documentation

## 2015-05-14 DIAGNOSIS — E785 Hyperlipidemia, unspecified: Secondary | ICD-10-CM | POA: Diagnosis not present

## 2015-05-14 DIAGNOSIS — R1011 Right upper quadrant pain: Secondary | ICD-10-CM | POA: Insufficient documentation

## 2015-05-14 DIAGNOSIS — R197 Diarrhea, unspecified: Secondary | ICD-10-CM | POA: Diagnosis not present

## 2015-05-14 DIAGNOSIS — R109 Unspecified abdominal pain: Secondary | ICD-10-CM | POA: Diagnosis present

## 2015-05-14 DIAGNOSIS — R55 Syncope and collapse: Secondary | ICD-10-CM | POA: Diagnosis not present

## 2015-05-14 DIAGNOSIS — R110 Nausea: Secondary | ICD-10-CM | POA: Diagnosis not present

## 2015-05-14 LAB — BASIC METABOLIC PANEL
ANION GAP: 8 (ref 5–15)
BUN: 15 mg/dL (ref 6–20)
CHLORIDE: 106 mmol/L (ref 101–111)
CO2: 24 mmol/L (ref 22–32)
CREATININE: 0.9 mg/dL (ref 0.44–1.00)
Calcium: 8.6 mg/dL — ABNORMAL LOW (ref 8.9–10.3)
GFR calc non Af Amer: >60 mL/min (ref >60)
Glucose, Bld: 128 mg/dL — ABNORMAL HIGH (ref 65–99)
Potassium: 4.1 mmol/L (ref 3.5–5.1)
Sodium: 138 mmol/L (ref 135–145)

## 2015-05-14 LAB — CBC WITH DIFFERENTIAL/PLATELET
Basophils Absolute: 0 10*3/uL (ref 0.0–0.1)
Basophils Relative: 0 %
Eosinophils Absolute: 0.2 10*3/uL (ref 0.0–0.7)
Eosinophils Relative: 2 %
HEMATOCRIT: 38.4 % (ref 36.0–46.0)
HEMOGLOBIN: 12.4 g/dL (ref 12.0–15.0)
LYMPHS ABS: 1.4 10*3/uL (ref 0.7–4.0)
Lymphocytes Relative: 13 %
MCH: 29 pg (ref 26.0–34.0)
MCHC: 32.3 g/dL (ref 30.0–36.0)
MCV: 89.7 fL (ref 78.0–100.0)
MONOS PCT: 4 %
Monocytes Absolute: 0.5 10*3/uL (ref 0.1–1.0)
NEUTROS ABS: 8.9 10*3/uL — AB (ref 1.7–7.7)
NEUTROS PCT: 81 %
Platelets: 349 10*3/uL (ref 150–400)
RBC: 4.28 MIL/uL (ref 3.87–5.11)
RDW: 12.3 % (ref 11.5–15.5)
WBC: 11 10*3/uL — ABNORMAL HIGH (ref 4.0–10.5)

## 2015-05-14 LAB — HEPATIC FUNCTION PANEL
ALK PHOS: 63 U/L (ref 38–126)
ALT: 19 U/L (ref 14–54)
AST: 25 U/L (ref 15–41)
Albumin: 4 g/dL (ref 3.5–5.0)
BILIRUBIN TOTAL: 0.5 mg/dL (ref 0.3–1.2)
Bilirubin, Direct: <0.1 mg/dL — ABNORMAL LOW (ref 0.1–0.5)
TOTAL PROTEIN: 7.4 g/dL (ref 6.5–8.1)

## 2015-05-14 LAB — I-STAT CG4 LACTIC ACID, ED: Lactic Acid, Venous: 0.95 mmol/L (ref 0.5–2.0)

## 2015-05-14 LAB — LIPASE, BLOOD: LIPASE: 29 U/L (ref 11–51)

## 2015-05-14 MED ORDER — SODIUM CHLORIDE 0.9 % IV BOLUS (SEPSIS)
1000.0000 mL | Freq: Once | INTRAVENOUS | Status: AC
  Administered 2015-05-14: 1000 mL via INTRAVENOUS

## 2015-05-14 MED ORDER — ONDANSETRON HCL 4 MG/2ML IJ SOLN
4.0000 mg | Freq: Once | INTRAMUSCULAR | Status: AC
  Administered 2015-05-14: 4 mg via INTRAVENOUS
  Filled 2015-05-14: qty 2

## 2015-05-14 NOTE — ED Notes (Addendum)
Reports feeling bad, onset yesterday, reports nvd, vomited x1, constant diarrhea ("watery, light brown, no blood"), here for syncope tonight, denies fall or injury. Dropped off by friend. Reports dizziness and nausea at this time, also upper abd pain. Alert, NAD, calm, interactive, speech clear, no dyspnea noted, self transferred from car to w/c.

## 2015-05-14 NOTE — ED Provider Notes (Signed)
CSN: [id number]     Arrival date & time 05/14/15  2102 History  By signing my name below, I, Erling Conte, attest that this documentation has been prepared under the direction and in the presence of Malvin Johns, MD. Electronically Signed: Erling Conte, ED Scribe. 05/14/2015. 11:46 PM.    Chief Complaint  Patient presents with  . Loss of Consciousness    The history is provided by the patient. No language interpreter was used.    HPI Comments: Traci [name] is a 69 y.o. female with a h/o HTN CAD, sigmoid diverticulitis, a-fib, and PVD with subclavian bypass and aorto-SMA bypass , who presents to the Emergency Department complaining of near syncope from a standing position which occurred tonight, just prior to arrival. She reports she has been feeling bad for the past 2 days with associated upper abdominal pain, nausea, non bilious and non bloody diarrhea. She states she began feeling lightheaded just prior to the near syncopal episode and began to fall but was caught by a friend. She denies any LOC, head injury or other injuries. Pt notes she has an extensive h/o abdominal issues with sigmoid diverticulitis, IBS, and SMA blockage s/p bypass. Pt has had multiple abdominal surgeries and has had a cholecystectomy, appendectomy and hysterectomy.  Pt denies any alleviating/aggravating factors. She denies any fevers, vomiting, leg swelling, or other associated symptoms.  She states the abd pain is similar to pain that she had a year ago when diagnosed with the SMA blockage requiring bypass.   Past Medical History  Diagnosis Date  . Hypertension 11/30/2010    echo- EF 55%; normal w/ mildly sclerotic aortic valve  . Hyperlipidemia   . Coronary artery disease   . GERD (gastroesophageal reflux disease)   . Osteopenia   . Sigmoid diverticulitis   . Peripheral vascular disease ([place])   . Atrial fibrillation ([place])   . Nonspecific ST-T wave electrocardiographic changes 03/26/2011     R/Lmv - EF 74%, normal perfusion all regions, ST depression w/ Lexiscan infusion w/o assoc angina  . CAD (coronary artery disease)   . PAD (peripheral artery disease) ([place])   . PVD (peripheral vascular disease) ([place]) 11/05/2011    doppler - R/L brachial pressures essentially equal w/o inflow disease; L sublclavian/CCA bypass graft demonstrates patent flow, no evidence of significant stenosis  . Hypertension 11/05/11    renal dopplers - celiac artery and SMA >50% diameter reduction, R renal artery - mildly elevated velocities 1-59% diameter reduction, L renal artery normal  . Claudication ([place]) 01/06/2008    Lower extremity dopplers - no evidence of arterial insufficiency, normal exam  . Anxiety    Past Surgical History  Procedure Laterality Date  . Cholecystectomy    . Appendectomy    . Abdominal hysterectomy    . Subclavian artery stents  01/23/2010    Left carotid to subclavian artery Bypass  . Spine surgery  Feb. 2011  . Subclavian artery stent  1995    X's  several  . Subclavian artery stent Left 09/20/2008    LSA ISR 7x5mm Cordis Genesis on Opta premount, reduction from 80% to 0%  . Cardiac catheterization  06/03/2008    60% LAD, involving D2, borderline significant by IVUS, medical therapy. CFX, RCA OK.  . Eye surgery      retinal surgery  . Abdominal aortic aneurysm repair N/A 01/27/2014    Procedure: AORTO-SUPERIOR MESENTERIC ARTERY BYPASS GRAFT;  Surgeon: Angelia Mould, MD;  Location: [facility];  Service: Vascular;  Laterality: N/A;  . Visceral angiogram  01/17/2014    Procedure: VISCERAL ANGIOGRAM;  Surgeon: Angelia Mould, MD;  Location: [facility] CATH LAB;  Service: Cardiovascular;;   Family History  Problem Relation Age of Onset  . Heart disease Father     Heart Disease before age 19  . Kidney disease Father   . Heart attack Father   . Hyperlipidemia Father   . Hypertension Father   . Diabetes Mother     Varicose Veins  . Diabetes Maternal Grandmother   . Heart  disease Paternal Grandfather    Social History  Substance Use Topics  . Smoking status: Former Smoker    Types: Cigarettes    Quit date: 09/30/1993  . Smokeless tobacco: Never Used  . Alcohol Use: No   OB History    No data available     Review of Systems  Constitutional: Negative for fever, chills, diaphoresis and fatigue.  HENT: Negative for congestion, rhinorrhea and sneezing.   Eyes: Negative.   Respiratory: Negative for cough, chest tightness and shortness of breath.   Cardiovascular: Positive for syncope. Negative for chest pain and leg swelling.  Gastrointestinal: Positive for nausea, abdominal pain and diarrhea. Negative for vomiting and blood in stool.  Genitourinary: Negative for frequency, hematuria, flank pain and difficulty urinating.  Musculoskeletal: Negative for back pain and arthralgias.  Skin: Negative for rash.  Neurological: Positive for syncope and light-headedness. Negative for dizziness, speech difficulty, weakness, numbness and headaches.      Allergies  Cortisone; Dilaudid; Iodine; Medrol; Omnipaque; Prednisone; Shellfish allergy; Sulfa drugs cross reactors; Augmentin; Morphine and related; Codeine; and Erythromycin  Home Medications   Prior to Admission medications   Medication Sig Start Date End Date Taking? Authorizing Provider  aspirin EC 81 MG tablet Take 81 mg by mouth daily.    Historical Provider, MD  atenolol (TENORMIN) 25 MG tablet Take 1 tablet (25 mg total) by mouth daily. 02/22/15   Mihai Croitoru, MD  atorvastatin (LIPITOR) 10 MG tablet Take 1 tablet (10 mg total) by mouth every evening. 02/22/15   Mihai Croitoru, MD  candesartan (ATACAND) 16 MG tablet Take 8 mg by mouth. Take daily at lunch.    Historical Provider, MD  cholecalciferol (VITAMIN D) 1000 UNITS tablet Take 1,000 Units by mouth daily.    Historical Provider, MD  clopidogrel (PLAVIX) 75 MG tablet Take 1 tablet (75 mg total) by mouth daily. 02/22/15   Mihai Croitoru, MD   clorazepate (TRANXENE) 7.5 MG tablet Take 1 tablet (7.5 mg total) by mouth daily. 02/16/15   Sean Hommel, DO  cyanocobalamin 1000 MCG tablet Take 100 mcg by mouth daily.    Historical Provider, MD  EPINEPHrine (EPIPEN 2-PAK) 0.3 mg/0.3 mL DEVI Inject 0.3 mg into the muscle as needed (for allergic reaction).    Historical Provider, MD  esomeprazole (NEXIUM) 40 MG capsule Take 1 capsule (40 mg total) by mouth daily. Take 1 tab daily 02/23/15   Mihai Croitoru, MD  estradiol (ESTRACE) 0.5 MG tablet Take 1 tablet (0.5 mg total) by mouth daily. 02/21/15   Sean Hommel, DO  fexofenadine (ALLEGRA) 180 MG tablet Take 180 mg by mouth daily.    Historical Provider, MD  furosemide (LASIX) 20 MG tablet Take 1 tablet (20 mg total) by mouth every other day. 02/22/15   Mihai Croitoru, MD  meclizine (ANTIVERT) 25 MG tablet Take 0.5 tablets (12.5 mg total) by mouth 3 (three) times daily as needed for dizziness or nausea. 02/16/15   Hilliard Clark  Hommel, DO  Multiple Vitamin (MULTIVITAMIN WITH MINERALS) TABS Take 1 tablet by mouth daily.    Historical Provider, MD  polyethylene glycol (MIRALAX / GLYCOLAX) packet Take 17 g by mouth 2 (two) times daily. Until stooling regularly 11/09/14   Silverio Decamp, MD  zolpidem (AMBIEN) 10 MG tablet Take 1 tablet (10 mg total) by mouth at bedtime as needed for sleep. 04/20/15   Sean Hommel, DO   BP 132/53 mmHg  Pulse 72  Temp(Src) 98.1 F (36.7 C) (Oral)  Resp 16  Ht 5\' 4"  (1.626 m)  Wt 148 lb (67.132 kg)  BMI 25.39 kg/m2  SpO2 98% Physical Exam  Constitutional: She is oriented to person, place, and time. She appears well-developed and well-nourished.  HENT:  Head: Normocephalic and atraumatic.  Eyes: Pupils are equal, round, and reactive to light.  Neck: Normal range of motion. Neck supple.  Cardiovascular: Normal rate, regular rhythm and normal heart sounds.   Pulmonary/Chest: Effort normal and breath sounds normal. No respiratory distress. She has no wheezes. She has no rales.  She exhibits no tenderness.  Abdominal: Soft. Bowel sounds are normal. There is tenderness (moderate across upper abdomen). There is no rebound and no guarding.  Musculoskeletal: Normal range of motion. She exhibits no edema.  Difficult to palpate pedal pulses bilaterally which pt says is chronic for her.  No signs of ischemia  Lymphadenopathy:    She has no cervical adenopathy.  Neurological: She is alert and oriented to person, place, and time.  Skin: Skin is warm and dry. No rash noted.  Psychiatric: She has a normal mood and affect.    ED Course  Procedures (including critical care time)  DIAGNOSTIC STUDIES: Oxygen Saturation is 100% on RA, normal by my interpretation.    COORDINATION OF CARE:    Labs Review Labs Reviewed  BASIC METABOLIC PANEL - Abnormal; Notable for the following:    Glucose, Bld 128 (*)    Calcium 8.6 (*)    All other components within normal limits  CBC WITH DIFFERENTIAL/PLATELET - Abnormal; Notable for the following:    WBC 11.0 (*)    Neutro Abs 8.9 (*)    All other components within normal limits  HEPATIC FUNCTION PANEL - Abnormal; Notable for the following:    Bilirubin, Direct <0.1 (*)    All other components within normal limits  LIPASE, BLOOD  URINALYSIS, ROUTINE W REFLEX MICROSCOPIC (NOT AT [facility])  I-STAT CG4 LACTIC ACID, ED  I-STAT CG4 LACTIC ACID, ED    Imaging Review Ct Abdomen Pelvis Wo Contrast  05/14/2015  CLINICAL DATA:  Near syncope, with upper abdominal pain, nausea and diarrhea. Initial encounter. EXAM: CT ABDOMEN AND PELVIS WITHOUT CONTRAST TECHNIQUE: Multidetector CT imaging of the abdomen and pelvis was performed following the standard protocol without IV contrast. COMPARISON:  CT of the abdomen and pelvis performed 11/09/2014, and MRI/MRA of the chest, abdomen and pelvis performed 11/10/2014 FINDINGS: Minimal bibasilar atelectasis is noted. The liver and spleen are unremarkable in appearance. The patient is status post  cholecystectomy. The pancreas and adrenal glands are unremarkable. The kidneys are unremarkable in appearance. There is no evidence of hydronephrosis. No renal or ureteral stones are seen. No perinephric stranding is appreciated. No free fluid is identified. The small bowel is unremarkable in appearance. The stomach is within normal limits. No acute vascular abnormalities are seen. Scattered calcification is noted along the abdominal aorta and its branches. The appendix is not definitely seen; there is no evidence of appendicitis.  Minimal diverticulosis is noted along the ascending, descending and sigmoid colon, without evidence of diverticulitis. Minimal soft tissue injury is noted anteriorly at the left mid abdomen. The bladder is mildly distended and grossly unremarkable. The patient is status post hysterectomy. No suspicious adnexal masses are seen. The ovaries are relatively symmetric. No inguinal lymphadenopathy is seen. No acute osseous abnormalities are identified. The patient is status post lumbar spinal fusion at L5-S1. IMPRESSION: 1. No acute abnormality seen to explain the patient's symptoms. 2. Minimal soft tissue injury noted anteriorly at the left mid abdomen. 3. Minimal diverticulosis along the ascending, descending and sigmoid colon, without evidence of diverticulitis. 4. Scattered calcification along the abdominal aorta and its branches. Electronically Signed   By: Garald Balding M.D.   On: 05/14/2015 23:08   I have personally reviewed and evaluated these images and lab results as part of my medical decision-making.   EKG Interpretation   Date/Time:  Sunday May 14 2015 21:13:53 EST Ventricular Rate:  67 PR Interval:  140 QRS Duration: 84 QT Interval:  420 QTC Calculation: 443 R Axis:   47 Text Interpretation:  Normal sinus rhythm Nonspecific ST and T wave  abnormality Abnormal ECG since last tracing no significant change  Confirmed by Zachrey Deutscher  MD, Malcolm Quast ([id number]) on 05/14/2015  10:40:13 PM      MDM   Final diagnoses:  Pain of upper abdomen    Patient presents with abdominal pain. Were unable to get a CT angiogram because she has a severe contrast allergy. On further talking with patient, she's had some pressure feeling in her upper abdomen, mostly after she eats for about the last week. She's been constipated and attributed to constipation. She's been taking MiraLAX for the last 2 days. She started having bowel movements today after the MiraLAX and then a large amount of watery diarrhea. She states last night and this morning the upper abdominal pain and gotten much worse. Since arrival to the ED, her pain has improved, with only mild "churning" of her abdomen.  She has a severe allergy to IV contrast with past anaphylaxis. In the past, she has gotten MRA  to evaluate her abdomen.  I discussed the case with Dr.Brabham, vascular surgeon on call.  It is felt that it would be better to go ahead and get an MRA of her abdomen tonight to assess for occluded graft. I spoke with the EDP at [facility], Dr. Maryan Rued who is accepted the patient for transfer.  Dr. Trula Slade advises to get MRA and call him if there are abnormalities.  Patient is amenable to this plan.  I personally performed the services described in this documentation, which was scribed in my presence.  The recorded information has been reviewed and considered.     Malvin Johns, MD 05/14/15 2350

## 2015-05-14 NOTE — ED Notes (Signed)
Pulse doppler in both feet with strong single present. Blood pressure different between lt and rt arm./ RT arm BP 147/52(79) and Lt arm 129/53(78).

## 2015-05-15 ENCOUNTER — Emergency Department (HOSPITAL_COMMUNITY): Payer: Medicare Other

## 2015-05-15 DIAGNOSIS — R109 Unspecified abdominal pain: Secondary | ICD-10-CM | POA: Diagnosis not present

## 2015-05-15 DIAGNOSIS — R1011 Right upper quadrant pain: Secondary | ICD-10-CM | POA: Diagnosis not present

## 2015-05-15 MED ORDER — TRAMADOL HCL 50 MG PO TABS: 50.0000 mg | ORAL_TABLET | Freq: Four times a day (QID) | ORAL | Status: DC | PRN

## 2015-05-15 MED ORDER — ONDANSETRON HCL 4 MG PO TABS: 4.0000 mg | ORAL_TABLET | Freq: Four times a day (QID) | ORAL | Status: DC

## 2015-05-15 MED ORDER — GI COCKTAIL ~~LOC~~
30.0000 mL | Freq: Once | ORAL | Status: AC
  Administered 2015-05-15: 30 mL via ORAL
  Filled 2015-05-15: qty 30

## 2015-05-15 MED ORDER — ONDANSETRON 4 MG PO TBDP
4.0000 mg | ORAL_TABLET | Freq: Once | ORAL | Status: AC
  Administered 2015-05-15: 4 mg via ORAL
  Filled 2015-05-15: qty 1

## 2015-05-15 MED ORDER — GADOBENATE DIMEGLUMINE 529 MG/ML IV SOLN
15.0000 mL | Freq: Once | INTRAVENOUS | Status: AC | PRN
  Administered 2015-05-15: 13 mL via INTRAVENOUS

## 2015-05-15 MED ORDER — TRAMADOL HCL 50 MG PO TABS
50.0000 mg | ORAL_TABLET | Freq: Once | ORAL | Status: AC
Start: 2015-05-15 — End: 2015-05-15
  Administered 2015-05-15: 50 mg via ORAL
  Filled 2015-05-15: qty 1

## 2015-05-15 NOTE — Discharge Instructions (Signed)

## 2015-05-15 NOTE — ED Notes (Signed)
Patient transported to MRI 

## 2015-05-15 NOTE — ED Notes (Signed)
Pt. Left with all belongings and refused wheelchair. Discharge instructions were reviewed and all questions were answered.  

## 2015-05-15 NOTE — ED Notes (Signed)
Assumed care of patient from [place], [place]. Pt resting quietly at this time. Abdominal pain 3/10 but improved since arrival. Pt awaiting EDP disposition with alleged plan to transfer to [facility] for MRI. Vitals stable. No distress. Pt updated. Pt comfortable.

## 2015-05-17 ENCOUNTER — Telehealth: Payer: Self-pay | Admitting: Cardiovascular Disease

## 2015-05-17 DIAGNOSIS — K581 Irritable bowel syndrome with constipation: Secondary | ICD-10-CM | POA: Diagnosis not present

## 2015-05-17 DIAGNOSIS — R1013 Epigastric pain: Secondary | ICD-10-CM | POA: Diagnosis not present

## 2015-05-17 NOTE — Telephone Encounter (Signed)
1. What office are you calling from? (pt calling in) [facility]- Dr. Helane Rima  2. What is your office phone and fax number? Phone- [phone] fax- [phone]  3. What type of procedure is the patient having performed? Endoscopy  4. What date is procedure scheduled? 12/14   5. What is your question (ex. Antibiotics prior to procedure, holding medication-we need to know how long dentist wants pt to hold med)? How long should she hold her Plavix 6.

## 2015-05-17 NOTE — Telephone Encounter (Signed)
Returned call to patient.She stated she is scheduled for a endoscopy 05/31/15,needs to know if ok to hold plavix and how long.Message sent to Dr.Croitoru for advice.

## 2015-05-18 ENCOUNTER — Encounter: Payer: Self-pay | Admitting: Cardiovascular Disease

## 2015-05-18 NOTE — Telephone Encounter (Signed)
Letter sent via EPIC to Dr. Bryn Gulling. Please make sure they received it. She does not need antibiotics.

## 2015-05-18 NOTE — Telephone Encounter (Signed)
Returned call to patient.Dr.Croitoru advised does not need antibiotics before procedure.May hold Plavix 5 to 7 days prior to procedure.Letter faxed to [place] at fax # [phone].

## 2015-05-23 DIAGNOSIS — Z1212 Encounter for screening for malignant neoplasm of rectum: Secondary | ICD-10-CM | POA: Diagnosis not present

## 2015-05-23 DIAGNOSIS — Z1211 Encounter for screening for malignant neoplasm of colon: Secondary | ICD-10-CM | POA: Diagnosis not present

## 2015-05-24 ENCOUNTER — Encounter (HOSPITAL_COMMUNITY): Payer: Medicare Other

## 2015-05-30 ENCOUNTER — Ambulatory Visit (HOSPITAL_COMMUNITY)
Admission: RE | Admit: 2015-05-30 | Discharge: 2015-05-30 | Disposition: A | Discharge status: Home / Self Care | Payer: Medicare Other | Source: Ambulatory Visit | Attending: Cardiovascular Disease | Admitting: Cardiovascular Disease

## 2015-05-30 ENCOUNTER — Other Ambulatory Visit: Payer: Self-pay | Admitting: Physician Assistant

## 2015-05-30 DIAGNOSIS — I10 Essential (primary) hypertension: Secondary | ICD-10-CM | POA: Insufficient documentation

## 2015-05-30 DIAGNOSIS — E785 Hyperlipidemia, unspecified: Secondary | ICD-10-CM | POA: Insufficient documentation

## 2015-05-30 DIAGNOSIS — I739 Peripheral vascular disease, unspecified: Secondary | ICD-10-CM

## 2015-06-09 ENCOUNTER — Ambulatory Visit (INDEPENDENT_AMBULATORY_CARE_PROVIDER_SITE_OTHER): Payer: Medicare Other | Admitting: Family Medicine

## 2015-06-09 ENCOUNTER — Encounter: Payer: Self-pay | Admitting: Family Medicine

## 2015-06-09 VITALS — BP 118/63 | HR 61 | Wt 149.0 lb

## 2015-06-09 DIAGNOSIS — J329 Chronic sinusitis, unspecified: Secondary | ICD-10-CM | POA: Diagnosis not present

## 2015-06-09 DIAGNOSIS — A499 Bacterial infection, unspecified: Secondary | ICD-10-CM

## 2015-06-09 DIAGNOSIS — B9689 Other specified bacterial agents as the cause of diseases classified elsewhere: Secondary | ICD-10-CM

## 2015-06-09 DIAGNOSIS — I701 Atherosclerosis of renal artery: Secondary | ICD-10-CM | POA: Diagnosis not present

## 2015-06-09 MED ORDER — CEFDINIR 300 MG PO CAPS: 300.0000 mg | ORAL_CAPSULE | Freq: Two times a day (BID) | ORAL | Status: AC

## 2015-06-09 NOTE — Progress Notes (Signed)
CC: Traci [name] is a 69 y.o. female is here for Bilateral Ear Pain and Right Side Pain   Subjective: HPI:  Bilateral ear fullness with facial pressure in the cheeks and nasal congestion. She is also experiencing a sore throat and mild loss of voice for the past 3-4 days. She began to feel fevers and chills with no objective temperatures starting yesterday. Symptoms are worse in the evening when lying down. Tylenol has slightly been helping. She denies cough, wheezing, shortness of breath, trouble swallowing or abdominal pain.   Review Of Systems Outlined In HPI  Past Medical History  Diagnosis Date  . Hypertension 11/30/2010    echo- EF 55%; normal w/ mildly sclerotic aortic valve  . Hyperlipidemia   . Coronary artery disease   . GERD (gastroesophageal reflux disease)   . Osteopenia   . Sigmoid diverticulitis   . Peripheral vascular disease ([place])   . Atrial fibrillation ([place])   . Nonspecific ST-T wave electrocardiographic changes 03/26/2011    R/Lmv - EF 74%, normal perfusion all regions, ST depression w/ Lexiscan infusion w/o assoc angina  . CAD (coronary artery disease)   . PAD (peripheral artery disease) ([place])   . PVD (peripheral vascular disease) ([place]) 11/05/2011    doppler - R/L brachial pressures essentially equal w/o inflow disease; L sublclavian/CCA bypass graft demonstrates patent flow, no evidence of significant stenosis  . Hypertension 11/05/11    renal dopplers - celiac artery and SMA >50% diameter reduction, R renal artery - mildly elevated velocities 1-59% diameter reduction, L renal artery normal  . Claudication ([place]) 01/06/2008    Lower extremity dopplers - no evidence of arterial insufficiency, normal exam  . Anxiety     Past Surgical History  Procedure Laterality Date  . Cholecystectomy    . Appendectomy    . Abdominal hysterectomy    . Subclavian artery stents  01/23/2010    Left carotid to subclavian artery Bypass  . Spine surgery  Feb. 2011  .  Subclavian artery stent  1995    X's  several  . Subclavian artery stent Left 09/20/2008    LSA ISR 7x32mm Cordis Genesis on Opta premount, reduction from 80% to 0%  . Cardiac catheterization  06/03/2008    60% LAD, involving D2, borderline significant by IVUS, medical therapy. CFX, RCA OK.  . Eye surgery      retinal surgery  . Abdominal aortic aneurysm repair N/A 01/27/2014    Procedure: AORTO-SUPERIOR MESENTERIC ARTERY BYPASS GRAFT;  Surgeon: Angelia Mould, MD;  Location: [place];  Service: Vascular;  Laterality: N/A;  . Visceral angiogram  01/17/2014    Procedure: VISCERAL ANGIOGRAM;  Surgeon: Angelia Mould, MD;  Location: [facility] CATH LAB;  Service: Cardiovascular;;   Family History  Problem Relation Age of Onset  . Heart disease Father     Heart Disease before age 45  . Kidney disease Father   . Heart attack Father   . Hyperlipidemia Father   . Hypertension Father   . Diabetes Mother     Varicose Veins  . Diabetes Maternal Grandmother   . Heart disease Paternal Grandfather     Social History   Social History  . Marital Status: Legally Separated    Spouse Name: N/A  . Number of Children: N/A  . Years of Education: N/A   Occupational History  . Not on file.   Social History Main Topics  . Smoking status: Former Smoker    Types: Cigarettes  Quit date: 09/30/1993  . Smokeless tobacco: Never Used  . Alcohol Use: No  . Drug Use: No  . Sexual Activity: Not on file   Other Topics Concern  . Not on file   Social History Narrative     Objective: BP 118/63 mmHg  Pulse 61  Wt 149 lb (67.586 kg)  General: Alert and Oriented, No Acute Distress HEENT: Pupils equal, round, reactive to light. Conjunctivae clear.  External ears unremarkable, canals clear with intact TMs with appropriate landmarks.  Middle ear appears open without effusion. Pink inferior turbinates.  Moist mucous membranes, pharynx without inflammation nor lesions.  Neck supple without palpable  lymphadenopathy nor abnormal masses. Lungs: Clear to auscultation bilaterally, no wheezing/ronchi/rales.  Comfortable work of breathing. Good air movement. Extremities: No peripheral edema.  Strong peripheral pulses.  Mental Status: No depression, anxiety, nor agitation. Skin: Warm and dry.  Assessment & Plan: Katreena was seen today for bilateral ear pain and right side pain.  Diagnoses and all orders for this visit:  Bacterial sinusitis -     cefdinir (OMNICEF) 300 MG capsule; Take 1 capsule (300 mg total) by mouth 2 (two) times daily.   Bacterial sinusitis therefore start Cefdinir and nasal saline washes.  Return if symptoms worsen or fail to improve.

## 2015-06-20 ENCOUNTER — Encounter: Payer: Self-pay | Admitting: Physician Assistant

## 2015-06-20 ENCOUNTER — Ambulatory Visit (INDEPENDENT_AMBULATORY_CARE_PROVIDER_SITE_OTHER): Payer: Medicare Other | Admitting: Physician Assistant

## 2015-06-20 VITALS — BP 116/39 | HR 74 | Ht 64.0 in | Wt 147.0 lb

## 2015-06-20 DIAGNOSIS — R69 Illness, unspecified: Principal | ICD-10-CM

## 2015-06-20 DIAGNOSIS — R059 Cough, unspecified: Secondary | ICD-10-CM

## 2015-06-20 DIAGNOSIS — J111 Influenza due to unidentified influenza virus with other respiratory manifestations: Secondary | ICD-10-CM

## 2015-06-20 DIAGNOSIS — R509 Fever, unspecified: Secondary | ICD-10-CM | POA: Diagnosis not present

## 2015-06-20 DIAGNOSIS — R05 Cough: Secondary | ICD-10-CM | POA: Diagnosis not present

## 2015-06-20 LAB — POCT INFLUENZA A/B
INFLUENZA A, POC: NEGATIVE
INFLUENZA B, POC: NEGATIVE

## 2015-06-20 MED ORDER — OSELTAMIVIR PHOSPHATE 75 MG PO CAPS: 75.0000 mg | ORAL_CAPSULE | Freq: Two times a day (BID) | ORAL | Status: DC

## 2015-06-20 MED ORDER — BENZONATATE 200 MG PO CAPS: 200.0000 mg | ORAL_CAPSULE | Freq: Three times a day (TID) | ORAL | Status: DC | PRN

## 2015-06-21 ENCOUNTER — Ambulatory Visit: Payer: Medicare Other | Admitting: Cardiovascular Disease

## 2015-06-21 NOTE — Progress Notes (Signed)
   Subjective:    Patient ID: Traci [name], female    DOB: 21-Nov-1945, 70 y.o.   MRN: [id number]  HPI Pt is a 70 yo female who presents to the clinic with sudden fatigue, body aches and cough that started last night at 10pm. She was not able to sleep at all last night. Yesterday all day preparing with bowel prep for a colonoscopy today. Of course she will be unable to do that. She does have some nasal congestion. No sinus pressure or ear pain. Pt reports fever last night but did not check it. She has tried nothing to make better. mucinex makes her feel crazy. No wheezing or SOB.    Review of Systems See HPI>     Objective:   Physical Exam  Constitutional: She is oriented to person, place, and time. She appears well-developed and well-nourished.  HENT:  Head: Normocephalic and atraumatic.  Right Ear: External ear normal.  Left Ear: External ear normal.  Mouth/Throat: No oropharyngeal exudate.  TM's clear bilaterally.  Oropharynx erythematous.  Bilateral nasal turbinates red and swollen.   Eyes:  Slightly injected bilateral conjunctiva.   Neck: Normal range of motion. Neck supple.  Cardiovascular: Normal rate, regular rhythm and normal heart sounds.   Pulmonary/Chest: Effort normal and breath sounds normal. She has no wheezes.  Lymphadenopathy:    She has no cervical adenopathy.  Neurological: She is alert and oriented to person, place, and time.  Psychiatric: She has a normal mood and affect. Her behavior is normal.          Assessment & Plan:  influenza like illness- flu testing negative. Due to symptoms and sudden onset flu seems most likely. Will go ahead and treat with tamiflu.tessalon were given for cough. Discussed other symptomatic care but she does not tolerate OTC medications very well. I do think she has potential for some mild dehydration due to bowel prep night before. Push fluids today. Follow up as needed.

## 2015-06-22 ENCOUNTER — Ambulatory Visit (INDEPENDENT_AMBULATORY_CARE_PROVIDER_SITE_OTHER): Payer: Medicare Other

## 2015-06-22 ENCOUNTER — Ambulatory Visit (INDEPENDENT_AMBULATORY_CARE_PROVIDER_SITE_OTHER): Payer: Medicare Other | Admitting: Family Medicine

## 2015-06-22 ENCOUNTER — Encounter: Payer: Self-pay | Admitting: Family Medicine

## 2015-06-22 VITALS — BP 105/49 | HR 74 | Temp 98.7°F | Wt 145.0 lb

## 2015-06-22 DIAGNOSIS — J069 Acute upper respiratory infection, unspecified: Secondary | ICD-10-CM

## 2015-06-22 DIAGNOSIS — R509 Fever, unspecified: Secondary | ICD-10-CM

## 2015-06-22 DIAGNOSIS — R0989 Other specified symptoms and signs involving the circulatory and respiratory systems: Secondary | ICD-10-CM

## 2015-06-22 DIAGNOSIS — Z87891 Personal history of nicotine dependence: Secondary | ICD-10-CM | POA: Diagnosis not present

## 2015-06-22 DIAGNOSIS — R05 Cough: Secondary | ICD-10-CM | POA: Diagnosis not present

## 2015-06-22 MED ORDER — MOXIFLOXACIN HCL 400 MG PO TABS: 400.0000 mg | ORAL_TABLET | Freq: Every day | ORAL | Status: DC

## 2015-06-22 NOTE — Progress Notes (Signed)
Traci [name] is a 70 y.o. female who presents to [place]: Primary Care today for sore throat and cough.  Patient seen on 2 days ago and treated for presumptive flu, taken tamiflu since then. Patient notes she has worsened since Tuesday with most significant complaint odynophagia and productive cough since yesterday. She notes fever 100 Tuesday, 101 Wednesday and 100 this morning. She has taken tylenol to control this. She notes pleuritic sharp chest pain and general weakness. She cannot eat due to the odynophagia and has lost 4 lbs in the last 3 days.  Patient notes she tolerated moxifloxacin well in the past.     Past Medical History  Diagnosis Date  . Hypertension 11/30/2010    echo- EF 55%; normal w/ mildly sclerotic aortic valve  . Hyperlipidemia   . Coronary artery disease   . GERD (gastroesophageal reflux disease)   . Osteopenia   . Sigmoid diverticulitis   . Peripheral vascular disease ([place])   . Atrial fibrillation ([place])   . Nonspecific ST-T wave electrocardiographic changes 03/26/2011    R/Lmv - EF 74%, normal perfusion all regions, ST depression w/ Lexiscan infusion w/o assoc angina  . CAD (coronary artery disease)   . PAD (peripheral artery disease) ([place])   . PVD (peripheral vascular disease) ([place]) 11/05/2011    doppler - R/L brachial pressures essentially equal w/o inflow disease; L sublclavian/CCA bypass graft demonstrates patent flow, no evidence of significant stenosis  . Hypertension 11/05/11    renal dopplers - celiac artery and SMA >50% diameter reduction, R renal artery - mildly elevated velocities 1-59% diameter reduction, L renal artery normal  . Claudication ([place]) 01/06/2008    Lower extremity dopplers - no evidence of arterial insufficiency, normal exam  . Anxiety    Past Surgical History  Procedure Laterality Date  . Cholecystectomy    . Appendectomy    .  Abdominal hysterectomy    . Subclavian artery stents  01/23/2010    Left carotid to subclavian artery Bypass  . Spine surgery  Feb. 2011  . Subclavian artery stent  1995    X's  several  . Subclavian artery stent Left 09/20/2008    LSA ISR 7x5mm Cordis Genesis on Opta premount, reduction from 80% to 0%  . Cardiac catheterization  06/03/2008    60% LAD, involving D2, borderline significant by IVUS, medical therapy. CFX, RCA OK.  . Eye surgery      retinal surgery  . Abdominal aortic aneurysm repair N/A 01/27/2014    Procedure: AORTO-SUPERIOR MESENTERIC ARTERY BYPASS GRAFT;  Surgeon: Angelia Mould, MD;  Location: [place];  Service: Vascular;  Laterality: N/A;  . Visceral angiogram  01/17/2014    Procedure: VISCERAL ANGIOGRAM;  Surgeon: Angelia Mould, MD;  Location: [facility] CATH LAB;  Service: Cardiovascular;;   Social History  Substance Use Topics  . Smoking status: Former Smoker    Types: Cigarettes    Quit date: 09/30/1993  . Smokeless tobacco: Never Used  . Alcohol Use: No   family history includes Diabetes in her maternal grandmother and mother; Heart attack in her father; Heart disease in her father and paternal grandfather; Hyperlipidemia in her father; Hypertension in her father; Kidney disease in her father.  ROS as above Medications: Current Outpatient Prescriptions  Medication Sig Dispense Refill  . aspirin EC 81 MG tablet Take 81 mg by mouth daily.    Marland Kitchen atenolol (TENORMIN) 25 MG tablet Take 1 tablet (25 mg  total) by mouth daily. 90 tablet 2  . atorvastatin (LIPITOR) 10 MG tablet Take 1 tablet (10 mg total) by mouth every evening. 90 tablet 2  . benzonatate (TESSALON) 200 MG capsule Take 1 capsule (200 mg total) by mouth 3 (three) times daily as needed for cough. 20 capsule 0  . candesartan (ATACAND) 16 MG tablet Take 8 mg by mouth. Take daily at lunch.    . cholecalciferol (VITAMIN D) 1000 UNITS tablet Take 1,000 Units by mouth daily.    . clopidogrel (PLAVIX) 75 MG  tablet Take 1 tablet (75 mg total) by mouth daily. 90 tablet 2  . clorazepate (TRANXENE) 7.5 MG tablet Take 1 tablet (7.5 mg total) by mouth daily. 90 tablet 1  . cyanocobalamin 1000 MCG tablet Take 100 mcg by mouth daily.    Marland Kitchen EPINEPHrine (EPIPEN 2-PAK) 0.3 mg/0.3 mL DEVI Inject 0.3 mg into the muscle as needed (for allergic reaction).    Marland Kitchen esomeprazole (NEXIUM) 40 MG capsule Take 1 capsule (40 mg total) by mouth daily. Take 1 tab daily 90 capsule 0  . estradiol (ESTRACE) 0.5 MG tablet Take 1 tablet (0.5 mg total) by mouth daily. 90 tablet 1  . fexofenadine (ALLEGRA) 180 MG tablet Take 180 mg by mouth daily.    . furosemide (LASIX) 20 MG tablet Take 1 tablet (20 mg total) by mouth every other day. 45 tablet 2  . meclizine (ANTIVERT) 25 MG tablet Take 0.5 tablets (12.5 mg total) by mouth 3 (three) times daily as needed for dizziness or nausea. 90 tablet 4  . moxifloxacin (AVELOX) 400 MG tablet Take 1 tablet (400 mg total) by mouth daily at 8 pm. 7 tablet 0  . Multiple Vitamin (MULTIVITAMIN WITH MINERALS) TABS Take 1 tablet by mouth daily.    Marland Kitchen oseltamivir (TAMIFLU) 75 MG capsule Take 1 capsule (75 mg total) by mouth 2 (two) times daily. For 5 days. 10 capsule 0  . polyethylene glycol (MIRALAX / GLYCOLAX) packet Take 17 g by mouth 2 (two) times daily. Until stooling regularly 30 packet 11  . zolpidem (AMBIEN) 10 MG tablet Take 1 tablet (10 mg total) by mouth at bedtime as needed for sleep. 30 tablet 2   No current facility-administered medications for this visit.   Allergies  Allergen Reactions  . Cortisone Anaphylaxis  . Dilaudid [Hydromorphone Hcl] Nausea And Vomiting    Pt states she will start vomiting immediately for 6 hours  . Iodine Anaphylaxis  . Medrol [Methylprednisolone] Anaphylaxis  . Omnipaque [Iohexol] Anaphylaxis  . Prednisone Anaphylaxis  . Shellfish Allergy Anaphylaxis  . Sulfa Drugs Cross Reactors Anaphylaxis  . Augmentin [Amoxicillin-Pot Clavulanate]     Upset stomach    . Morphine And Related     nausea  . Codeine Rash  . Erythromycin Rash     Exam:  BP 105/49 mmHg  Pulse 74  Temp(Src) 98.7 F (37.1 C) (Oral)  Wt 145 lb (65.772 kg)  SpO2 100% Gen: Ill-appearing but non-toxic lady in NAD HEENT: EOMI,  MMM, erythematous posterior OP without lesion or exudate  Lungs: Normal work of breathing. CTABL without wheezes or crackles. Diminished lung sounds in the R lower posterior lung field.  Heart: RRR no MRG Abd: NABS, Soft. Nondistended, Nontender Exts: Brisk capillary refill, warm and well perfused.   No results found for this or any previous visit (from the past 24 hour(s)). No results found.   Please see individual assessment and plan sections.

## 2015-06-22 NOTE — Progress Notes (Signed)
Quick Note:  No pneumonia ______

## 2015-06-22 NOTE — Assessment & Plan Note (Signed)
Most likely pneumonia given ongoing fever, weight loss and productive cough. Will get CXR and treat empirically with moxifloxacin.

## 2015-06-22 NOTE — Patient Instructions (Signed)
Thank you for coming in today. You were seen for your ongoing illness. We think you have pneumonia. We will get a chest XRay today and give you antibiotics to start treating this. Please come back early next week if this is not improving.

## 2015-06-23 LAB — CBC WITH DIFFERENTIAL/PLATELET
BASOS PCT: 0 % (ref 0–1)
Basophils Absolute: 0 10*3/uL (ref 0.0–0.1)
EOS ABS: 0.1 10*3/uL (ref 0.0–0.7)
EOS PCT: 2 % (ref 0–5)
HCT: 37.8 % (ref 36.0–46.0)
Hemoglobin: 12.3 g/dL (ref 12.0–15.0)
LYMPHS ABS: 1.3 10*3/uL (ref 0.7–4.0)
Lymphocytes Relative: 22 % (ref 12–46)
MCH: 28 pg (ref 26.0–34.0)
MCHC: 32.5 g/dL (ref 30.0–36.0)
MCV: 86.1 fL (ref 78.0–100.0)
MONOS PCT: 13 % — AB (ref 3–12)
MPV: 9.2 fL (ref 8.6–12.4)
Monocytes Absolute: 0.8 10*3/uL (ref 0.1–1.0)
Neutro Abs: 3.7 10*3/uL (ref 1.7–7.7)
Neutrophils Relative %: 63 % (ref 43–77)
PLATELETS: 295 10*3/uL (ref 150–400)
RBC: 4.39 MIL/uL (ref 3.87–5.11)
RDW: 12.9 % (ref 11.5–15.5)
WBC: 5.8 10*3/uL (ref 4.0–10.5)

## 2015-06-23 LAB — COMPREHENSIVE METABOLIC PANEL
ALT: 16 U/L (ref 6–29)
AST: 26 U/L (ref 10–35)
Albumin: 3.8 g/dL (ref 3.6–5.1)
Alkaline Phosphatase: 61 U/L (ref 33–130)
BUN: 16 mg/dL (ref 7–25)
CHLORIDE: 104 mmol/L (ref 98–110)
CO2: 25 mmol/L (ref 20–31)
Calcium: 8.7 mg/dL (ref 8.6–10.4)
Creat: 1.14 mg/dL — ABNORMAL HIGH (ref 0.50–0.99)
GLUCOSE: 132 mg/dL — AB (ref 65–99)
POTASSIUM: 4.2 mmol/L (ref 3.5–5.3)
SODIUM: 139 mmol/L (ref 135–146)
Total Bilirubin: 0.5 mg/dL (ref 0.2–1.2)
Total Protein: 6.8 g/dL (ref 6.1–8.1)

## 2015-06-23 NOTE — Progress Notes (Signed)
Quick Note:  Labs are not significantly abnormal. Follow up with Dr Lemmie Evens in a few weeks. ______

## 2015-07-12 DIAGNOSIS — R195 Other fecal abnormalities: Secondary | ICD-10-CM | POA: Diagnosis not present

## 2015-07-12 DIAGNOSIS — K5730 Diverticulosis of large intestine without perforation or abscess without bleeding: Secondary | ICD-10-CM | POA: Diagnosis not present

## 2015-07-12 LAB — HM COLONOSCOPY

## 2015-07-13 DIAGNOSIS — Z1211 Encounter for screening for malignant neoplasm of colon: Secondary | ICD-10-CM | POA: Diagnosis not present

## 2015-07-13 DIAGNOSIS — K5730 Diverticulosis of large intestine without perforation or abscess without bleeding: Secondary | ICD-10-CM | POA: Diagnosis not present

## 2015-07-17 ENCOUNTER — Encounter: Payer: Self-pay | Admitting: Family Medicine

## 2015-07-17 DIAGNOSIS — Z9889 Other specified postprocedural states: Secondary | ICD-10-CM | POA: Insufficient documentation

## 2015-07-27 ENCOUNTER — Encounter: Payer: Self-pay | Admitting: Cardiovascular Disease

## 2015-07-27 ENCOUNTER — Ambulatory Visit (INDEPENDENT_AMBULATORY_CARE_PROVIDER_SITE_OTHER): Payer: Medicare Other | Admitting: Cardiovascular Disease

## 2015-07-27 VITALS — BP 116/58 | HR 62 | Ht 64.0 in | Wt 148.8 lb

## 2015-07-27 DIAGNOSIS — I2510 Atherosclerotic heart disease of native coronary artery without angina pectoris: Secondary | ICD-10-CM

## 2015-07-27 DIAGNOSIS — K551 Chronic vascular disorders of intestine: Secondary | ICD-10-CM | POA: Diagnosis not present

## 2015-07-27 DIAGNOSIS — E785 Hyperlipidemia, unspecified: Secondary | ICD-10-CM | POA: Diagnosis not present

## 2015-07-27 DIAGNOSIS — I771 Stricture of artery: Secondary | ICD-10-CM | POA: Diagnosis not present

## 2015-07-27 NOTE — Progress Notes (Signed)
Patient ID: Traci [name], female   DOB: 1945-09-11, 70 y.o.   MRN: [id number]    Cardiology Office Note    Date:  07/29/2015   ID:  KIELYN DUDZIAK, DOB 1946-01-19, MRN [id number]  PCP:  Marcial Pacas, DO  Cardiologist:   Sanda Klein, MD   Chief Complaint  Patient presents with  . Follow-up    2 months//pt c/o occasional sharp chest pain--lasts a second, happens once every 2-3 weeks, in center of chest, no other Sx.    History of Present Illness:  Traci [name] is a 70 y.o. female with an extensive history of peripheral arterial disease involving the mesenteric circulation and upper extremities. She is here for routine follow-up. She is planning to go to [facility] later today to participate in a dance contest. She does not have angina, dyspnea or claudication.  She saw Rosaria Ferries in Dec 2016 with atypical leg complaints that might have represented claudication. Normal ankle brachial indices by ultrasound Dec 2016. Her last upper extremity arterial duplex was performed in July 2016 and showed a patent carotid subclavian bypass on the left. Today I detect no difference in the blood pressures in her left and right upper extremities.  She had a "false positive" Cologuard study She had a colonoscopy about 2 weeks ago followed by a virtual CT colonoscopy since the cecum could not be reached. No evidence of malignancy. She has a lot of problems with acid reflux and has to take a proton pump inhibitor. She is also on chronic clopidogrel therapy.  [name] has long-standing history of peripheral arterial disease with previous stents and surgical revascularization of the left subclavian artery for subclavian steal syndrome and presyncope. She eventually underwent a left carotid to subclavian bypass.  She underwent a bypass from the aorta to the chronically totally occluded superficial mesenteric artery due to symptoms of chronic mesenteric  ischemia, (Dr. Scot Dock August 2015 6 mm Dacron graft) She had moderate CAD by coronary angiography performed in 2009. Normal left ventricular systolic function and no perfusion abnormalities by echo 2015 and nuclear stress test performed in 2014.   Past Medical History  Diagnosis Date  . Hypertension 11/30/2010    echo- EF 55%; normal w/ mildly sclerotic aortic valve  . Hyperlipidemia   . Coronary artery disease   . GERD (gastroesophageal reflux disease)   . Osteopenia   . Sigmoid diverticulitis   . Peripheral vascular disease ([place])   . Atrial fibrillation ([place])   . Nonspecific ST-T wave electrocardiographic changes 03/26/2011    R/Lmv - EF 74%, normal perfusion all regions, ST depression w/ Lexiscan infusion w/o assoc angina  . CAD (coronary artery disease)   . PAD (peripheral artery disease) ([place])   . PVD (peripheral vascular disease) ([place]) 11/05/2011    doppler - R/L brachial pressures essentially equal w/o inflow disease; L sublclavian/CCA bypass graft demonstrates patent flow, no evidence of significant stenosis  . Hypertension 11/05/11    renal dopplers - celiac artery and SMA >50% diameter reduction, R renal artery - mildly elevated velocities 1-59% diameter reduction, L renal artery normal  . Claudication ([place]) 01/06/2008    Lower extremity dopplers - no evidence of arterial insufficiency, normal exam  . Anxiety     Past Surgical History  Procedure Laterality Date  . Cholecystectomy    . Appendectomy    . Abdominal hysterectomy    . Subclavian artery stents  01/23/2010    Left carotid to subclavian artery Bypass  .  Spine surgery  Feb. 2011  . Subclavian artery stent  1995    X's  several  . Subclavian artery stent Left 09/20/2008    LSA ISR 7x62mm Cordis Genesis on Opta premount, reduction from 80% to 0%  . Cardiac catheterization  06/03/2008    60% LAD, involving D2, borderline significant by IVUS, medical therapy. CFX, RCA OK.  . Eye surgery      retinal surgery  .  Abdominal aortic aneurysm repair N/A 01/27/2014    Procedure: AORTO-SUPERIOR MESENTERIC ARTERY BYPASS GRAFT;  Surgeon: Angelia Mould, MD;  Location: [place];  Service: Vascular;  Laterality: N/A;  . Visceral angiogram  01/17/2014    Procedure: VISCERAL ANGIOGRAM;  Surgeon: Angelia Mould, MD;  Location: [facility] C[facility] LAB;  Service: Cardiovascular;;    Outpatient Prescriptions Prior to Visit  Medication Sig Dispense Refill  . aspirin EC 81 MG tablet Take 81 mg by mouth daily.    Marland Kitchen atenolol (TENORMIN) 25 MG tablet Take 1 tablet (25 mg total) by mouth daily. 90 tablet 2  . atorvastatin (LIPITOR) 10 MG tablet Take 1 tablet (10 mg total) by mouth every evening. 90 tablet 2  . candesartan (ATACAND) 16 MG tablet Take 8 mg by mouth. Take daily at lunch.    . cholecalciferol (VITAMIN D) 1000 UNITS tablet Take 1,000 Units by mouth daily.    . clopidogrel (PLAVIX) 75 MG tablet Take 1 tablet (75 mg total) by mouth daily. 90 tablet 2  . clorazepate (TRANXENE) 7.5 MG tablet Take 1 tablet (7.5 mg total) by mouth daily. 90 tablet 1  . cyanocobalamin 1000 MCG tablet Take 100 mcg by mouth daily.    Marland Kitchen EPINEPHrine (EPIPEN 2-PAK) 0.3 mg/0.3 mL DEVI Inject 0.3 mg into the muscle as needed (for allergic reaction).    Marland Kitchen esomeprazole (NEXIUM) 40 MG capsule Take 1 capsule (40 mg total) by mouth daily. Take 1 tab daily 90 capsule 0  . estradiol (ESTRACE) 0.5 MG tablet Take 1 tablet (0.5 mg total) by mouth daily. 90 tablet 1  . furosemide (LASIX) 20 MG tablet Take 1 tablet (20 mg total) by mouth every other day. 45 tablet 2  . meclizine (ANTIVERT) 25 MG tablet Take 0.5 tablets (12.5 mg total) by mouth 3 (three) times daily as needed for dizziness or nausea. 90 tablet 4  . Multiple Vitamin (MULTIVITAMIN WITH MINERALS) TABS Take 1 tablet by mouth daily.    . polyethylene glycol (MIRALAX / GLYCOLAX) packet Take 17 g by mouth 2 (two) times daily. Until stooling regularly 30 packet 11  . zolpidem (AMBIEN) 10 MG tablet  Take 1 tablet (10 mg total) by mouth at bedtime as needed for sleep. 30 tablet 2  . benzonatate (TESSALON) 200 MG capsule Take 1 capsule (200 mg total) by mouth 3 (three) times daily as needed for cough. 20 capsule 0  . fexofenadine (ALLEGRA) 180 MG tablet Take 180 mg by mouth daily.    Marland Kitchen moxifloxacin (AVELOX) 400 MG tablet Take 1 tablet (400 mg total) by mouth daily at 8 pm. 7 tablet 0  . oseltamivir (TAMIFLU) 75 MG capsule Take 1 capsule (75 mg total) by mouth 2 (two) times daily. For 5 days. 10 capsule 0   No facility-administered medications prior to visit.     Allergies:   Cortisone; Dilaudid; Iodine; Medrol; Omnipaque; Prednisone; Shellfish allergy; Sulfa drugs cross reactors; Augmentin; Morphine and related; Codeine; and Erythromycin   Social History   Social History  . Marital Status: Legally Separated  Spouse Name: N/A  . Number of Children: N/A  . Years of Education: N/A   Social History Main Topics  . Smoking status: Former Smoker    Types: Cigarettes    Quit date: 09/30/1993  . Smokeless tobacco: Never Used  . Alcohol Use: No  . Drug Use: No  . Sexual Activity: Not Asked   Other Topics Concern  . None   Social History Narrative     Family History:  The patient's family history includes Diabetes in her maternal grandmother and mother; Heart attack in her father; Heart disease in her father and paternal grandfather; Hyperlipidemia in her father; Hypertension in her father; Kidney disease in her father.   ROS:   Please see the history of present illness.    ROS All other systems reviewed and are negative.   PHYSICAL EXAM:   VS:  BP 116/58 mmHg  Pulse 62  Ht 5\' 4"  (1.626 m)  Wt 67.495 kg (148 lb 12.8 oz)  BMI 25.53 kg/m2   Equal round left upper extremity blood pressures GEN: Well nourished, well developed, in no acute distress HEENT: normal Neck: no JVD, faint bilateral carotid bruits, no carotid delay or masses; scar of previous carotid-subclavian bypass  surgery Cardiac: RRR; no murmurs, rubs, or gallops,no edema  Respiratory:  clear to auscultation bilaterally, normal work of breathing GI: soft, nontender, nondistended, + BS MS: no deformity or atrophy Skin: warm and dry, no rash Neuro:  Alert and Oriented x 3, Strength and sensation are intact Psych: euthymic mood, full affect  Wt Readings from Last 3 Encounters:  07/27/15 67.495 kg (148 lb 12.8 oz)  06/22/15 65.772 kg (145 lb)  06/20/15 66.679 kg (147 lb)      Studies/Labs Reviewed:   EKG:  EKG is not ordered today.  The ekg ordered 05/15/2015 demonstrates normal sinus rhythm with nonspecific ST-T changes Recent Labs: 03/12/2015: B Natriuretic Peptide 64.6 06/22/2015: ALT 16; BUN 16; Creat 1.14*; Hemoglobin 12.3; Platelets 295; Potassium 4.2; Sodium 139   Lipid Panel    Component Value Date/Time   CHOL 145 09/20/2014 0921   CHOL 141 04/07/2013 0857   TRIG 145 09/20/2014 0921   HDL 48 09/20/2014 0921   HDL 47 04/07/2013 0857   CHOLHDL 3.0 09/20/2014 0921   CHOLHDL 3.0 04/07/2013 0857   VLDL 29 09/20/2014 0921   LDLCALC 68 09/20/2014 0921   LDLCALC 67 04/07/2013 0857    ASSESSMENT:    1. Coronary artery disease, non-occlusive, last cath 2009   2. 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 Hyperlipidemia      PLAN:  In order of problems listed above:  1. Asymptomatic. No angina pectoris. She had a 50-60 percent LAD lesion at that time. He asks about routine stress testing. I told her that as long as she is physically active and asymptomatic this will probably not change her management. 2. No symptoms of mesenteric angina, her current complaints sound more like acid reflux. The potential interaction between clopidogrel and Nexium is acknowledged, but is unlikely to be critical at this point in her vascular disease. 3. Status post aorto mesenteric bypass, no postprandial intestinal angina 4. Status post  carotid subclavian bypass, no claudication 5. Excellent lipid profile on current regimen with LDL less than 70    Medication Adjustments/Labs and Tests Ordered: Current medicines are reviewed at length with the patient today.  Concerns regarding medicines are outlined above.  Medication changes, Labs and Tests ordered today are listed in the Patient Instructions below. Patient Instructions  Dr. Sallyanne Kuster recommends that you schedule a follow-up appointment in: 6 months        Signed, Sanda Klein, MD  07/29/2015 12:21 AM    [address], [place], [place]  [id number] Phone: [phone]; Fax: [phone]

## 2015-07-27 NOTE — Patient Instructions (Signed)
Dr. Croitoru recommends that you schedule a follow-up appointment in: 6 months    

## 2015-08-02 ENCOUNTER — Other Ambulatory Visit: Payer: Self-pay | Admitting: Family Medicine

## 2015-08-11 ENCOUNTER — Encounter: Payer: Self-pay | Admitting: Family Medicine

## 2015-08-11 ENCOUNTER — Ambulatory Visit (INDEPENDENT_AMBULATORY_CARE_PROVIDER_SITE_OTHER): Payer: Medicare Other | Admitting: Family Medicine

## 2015-08-11 VITALS — BP 126/76 | HR 64 | Temp 98.0°F | Wt 147.0 lb

## 2015-08-11 DIAGNOSIS — R0981 Nasal congestion: Secondary | ICD-10-CM

## 2015-08-11 DIAGNOSIS — Z9889 Other specified postprocedural states: Secondary | ICD-10-CM | POA: Diagnosis not present

## 2015-08-11 DIAGNOSIS — G4700 Insomnia, unspecified: Secondary | ICD-10-CM

## 2015-08-11 DIAGNOSIS — I2510 Atherosclerotic heart disease of native coronary artery without angina pectoris: Secondary | ICD-10-CM

## 2015-08-11 MED ORDER — AZELASTINE HCL 0.1 % NA SOLN: 2.0000 | Freq: Two times a day (BID) | NASAL | Status: DC

## 2015-08-11 MED ORDER — ZOLPIDEM TARTRATE 10 MG PO TABS: 10.0000 mg | ORAL_TABLET | Freq: Every evening | ORAL | Status: DC | PRN

## 2015-08-11 NOTE — Progress Notes (Signed)
CC: Traci [name] is a 70 y.o. female is here for URI   Subjective: HPI:  Nasal congestion and postnasal drip with the loss of her voice for the past 5 days. No benefit from gargling, Coricidin, allergy medicine, and herbal remedies. She denies fevers, chills, cough, no shortness of breath. She denies dysphagia or trouble swallowing. Symptoms are mild in severity. She wants relief. States she feels normal other than this.  Complains of the process of having a false positive Cologuard, requiring a colonoscopy followed by a virtual colonoscopy which was ultimately reassuring.   Review Of Systems Outlined In HPI  Past Medical History  Diagnosis Date  . Hypertension 11/30/2010    echo- EF 55%; normal w/ mildly sclerotic aortic valve  . Hyperlipidemia   . Coronary artery disease   . GERD (gastroesophageal reflux disease)   . Osteopenia   . Sigmoid diverticulitis   . Peripheral vascular disease ([place])   . Atrial fibrillation ([place])   . Nonspecific ST-T wave electrocardiographic changes 03/26/2011    R/Lmv - EF 74%, normal perfusion all regions, ST depression w/ Lexiscan infusion w/o assoc angina  . CAD (coronary artery disease)   . PAD (peripheral artery disease) ([place])   . PVD (peripheral vascular disease) ([place]) 11/05/2011    doppler - R/L brachial pressures essentially equal w/o inflow disease; L sublclavian/CCA bypass graft demonstrates patent flow, no evidence of significant stenosis  . Hypertension 11/05/11    renal dopplers - celiac artery and SMA >50% diameter reduction, R renal artery - mildly elevated velocities 1-59% diameter reduction, L renal artery normal  . Claudication ([place]) 01/06/2008    Lower extremity dopplers - no evidence of arterial insufficiency, normal exam  . Anxiety     Past Surgical History  Procedure Laterality Date  . Cholecystectomy    . Appendectomy    . Abdominal hysterectomy    . Subclavian artery stents  01/23/2010    Left carotid to subclavian  artery Bypass  . Spine surgery  Feb. 2011  . Subclavian artery stent  1995    X's  several  . Subclavian artery stent Left 09/20/2008    LSA ISR 7x32mm Cordis Genesis on Opta premount, reduction from 80% to 0%  . Cardiac catheterization  06/03/2008    60% LAD, involving D2, borderline significant by IVUS, medical therapy. CFX, RCA OK.  . Eye surgery      retinal surgery  . Abdominal aortic aneurysm repair N/A 01/27/2014    Procedure: AORTO-SUPERIOR MESENTERIC ARTERY BYPASS GRAFT;  Surgeon: Angelia Mould, MD;  Location: [place];  Service: Vascular;  Laterality: N/A;  . Visceral angiogram  01/17/2014    Procedure: VISCERAL ANGIOGRAM;  Surgeon: Angelia Mould, MD;  Location: [facility] CATH LAB;  Service: Cardiovascular;;   Family History  Problem Relation Age of Onset  . Heart disease Father     Heart Disease before age 28  . Kidney disease Father   . Heart attack Father   . Hyperlipidemia Father   . Hypertension Father   . Diabetes Mother     Varicose Veins  . Diabetes Maternal Grandmother   . Heart disease Paternal Grandfather     Social History   Social History  . Marital Status: Legally Separated    Spouse Name: N/A  . Number of Children: N/A  . Years of Education: N/A   Occupational History  . Not on file.   Social History Main Topics  . Smoking status: Former Smoker  Types: Cigarettes    Quit date: 09/30/1993  . Smokeless tobacco: Never Used  . Alcohol Use: No  . Drug Use: No  . Sexual Activity: Not on file   Other Topics Concern  . Not on file   Social History Narrative     Objective: BP 126/76 mmHg  Pulse 64  Temp(Src) 98 F (36.7 C) (Oral)  Wt 147 lb (66.679 kg)  SpO2 99%  General: Alert and Oriented, No Acute Distress HEENT: Pupils equal, round, reactive to light. Conjunctivae clear.  External ears unremarkable, canals clear with intact TMs with appropriate landmarks.  Middle ear appears open without effusion. Pink inferior turbinates.  Moist  mucous membranes, pharynx without inflammation nor lesions.  Neck supple without palpable lymphadenopathy nor abnormal masses. Lungs: clear andcomfortable work of breathing Cardiac: regular rate and rhythm Extremities: No peripheral edema.  Strong peripheral pulses.  Mental Status: No depression, anxiety, nor agitation. Skin: Warm and dry.  Assessment & Plan: Traci [name] was seen today for uri.  Diagnoses and all orders for this visit:  Insomnia -     zolpidem (AMBIEN) 10 MG tablet; Take 1 tablet (10 mg total) by mouth at bedtime as needed for sleep.  Nasal congestion -     azelastine (ASTELIN) 0.1 % nasal spray; Place 2 sprays into both nostrils 2 (two) times daily. Use in each nostril as directed  History of colonoscopy   She request refills on Ambien, seems reasonable, still controlling her insomnia. Nasal congestion resulting in postnasal drip and loss of voice: focusing on controlling mucus production of the nasal passages with Astelin, she is intolerant of steroids Time was taken to talk to her about her journey ultimately leading to a virtual colonoscopy and that although Cologuard give her a false result for the population of a whole it is still a benefit.  25 minutes spent face-to-face during visit today of which at least 50% was counseling or coordinating care regarding: 1. Insomnia   2. Nasal congestion   3. History of colonoscopy      Return if symptoms worsen or fail to improve.

## 2015-08-21 ENCOUNTER — Other Ambulatory Visit: Payer: Self-pay

## 2015-08-21 DIAGNOSIS — G4700 Insomnia, unspecified: Secondary | ICD-10-CM

## 2015-08-21 MED ORDER — CLORAZEPATE DIPOTASSIUM 7.5 MG PO TABS: 7.5000 mg | ORAL_TABLET | Freq: Every day | ORAL | Status: DC | PRN

## 2015-08-21 NOTE — Telephone Encounter (Signed)
Is this refill appropriate?  

## 2015-08-21 NOTE — Telephone Encounter (Signed)
Traci [name], Rx placed in in-box ready for pickup/faxing.  

## 2015-08-25 ENCOUNTER — Encounter: Payer: Self-pay | Admitting: Vascular Surgery

## 2015-08-30 ENCOUNTER — Ambulatory Visit (HOSPITAL_COMMUNITY)
Admission: RE | Admit: 2015-08-30 | Discharge: 2015-08-30 | Disposition: A | Discharge status: Home / Self Care | Payer: Medicare Other | Source: Ambulatory Visit | Attending: Vascular Surgery | Admitting: Vascular Surgery

## 2015-08-30 ENCOUNTER — Encounter: Payer: Self-pay | Admitting: Vascular Surgery

## 2015-08-30 ENCOUNTER — Ambulatory Visit (INDEPENDENT_AMBULATORY_CARE_PROVIDER_SITE_OTHER): Payer: Medicare Other | Admitting: Vascular Surgery

## 2015-08-30 VITALS — BP 121/52 | HR 65 | Temp 97.3°F | Resp 16 | Ht 64.0 in | Wt 149.0 lb

## 2015-08-30 DIAGNOSIS — R0989 Other specified symptoms and signs involving the circulatory and respiratory systems: Secondary | ICD-10-CM | POA: Diagnosis not present

## 2015-08-30 DIAGNOSIS — Z48812 Encounter for surgical aftercare following surgery on the circulatory system: Secondary | ICD-10-CM | POA: Diagnosis not present

## 2015-08-30 DIAGNOSIS — I2510 Atherosclerotic heart disease of native coronary artery without angina pectoris: Secondary | ICD-10-CM | POA: Diagnosis not present

## 2015-08-30 DIAGNOSIS — I10 Essential (primary) hypertension: Secondary | ICD-10-CM | POA: Insufficient documentation

## 2015-08-30 DIAGNOSIS — I6523 Occlusion and stenosis of bilateral carotid arteries: Secondary | ICD-10-CM | POA: Insufficient documentation

## 2015-08-30 DIAGNOSIS — E785 Hyperlipidemia, unspecified: Secondary | ICD-10-CM | POA: Diagnosis not present

## 2015-08-30 NOTE — Progress Notes (Signed)
Vascular and Vein Specialist of [facility]  Patient name: JOEY KENDRICKS MRN: [id number] DOB: August 13, 1945 Sex: female  REASON FOR VISIT: Follow up of her vascular disease.  HPI: HARMANY POND is a 70 y.o. female who most recently underwent an aorto to superior mesenteric artery bypass with a 6 mm Dacron graft on 01/27/2014. When I last saw her on 08/24/2014, she was no longer having postprandial abdominal pain. She comes in for one-year follow up visit. She denies any postprandial abdominal pain. She has gained 5 pounds. Thus I believe that her SMA bypass is patent.  Addition, she has undergone a previous left carotid subclavian bypass in May 2013. He does note some intermittent left arm paresthesias although she states she's had a palpable left radial pulse. I do not get any history of stroke, expressive or receptive aphasia, or amaurosis fugax. Her last carotid duplex was 2 years ago and it was no significant carotid disease at that time.  Past Medical History  Diagnosis Date  . Hypertension 11/30/2010    echo- EF 55%; normal w/ mildly sclerotic aortic valve  . Hyperlipidemia   . Coronary artery disease   . GERD (gastroesophageal reflux disease)   . Osteopenia   . Sigmoid diverticulitis   . Peripheral vascular disease ([place])   . Atrial fibrillation ([place])   . Nonspecific ST-T wave electrocardiographic changes 03/26/2011    R/Lmv - EF 74%, normal perfusion all regions, ST depression w/ Lexiscan infusion w/o assoc angina  . CAD (coronary artery disease)   . PAD (peripheral artery disease) ([place])   . PVD (peripheral vascular disease) ([place]) 11/05/2011    doppler - R/L brachial pressures essentially equal w/o inflow disease; L sublclavian/CCA bypass graft demonstrates patent flow, no evidence of significant stenosis  . Hypertension 11/05/11    renal dopplers - celiac artery and SMA >50% diameter reduction, R renal artery - mildly elevated velocities 1-59% diameter  reduction, L renal artery normal  . Claudication ([place]) 01/06/2008    Lower extremity dopplers - no evidence of arterial insufficiency, normal exam  . Anxiety     Family History  Problem Relation Age of Onset  . Heart disease Father     Heart Disease before age 72  . Kidney disease Father   . Heart attack Father   . Hyperlipidemia Father   . Hypertension Father   . Diabetes Mother     Varicose Veins  . Diabetes Maternal Grandmother   . Heart disease Paternal Grandfather     SOCIAL HISTORY: Social History  Substance Use Topics  . Smoking status: Former Smoker    Types: Cigarettes    Quit date: 09/30/1993  . Smokeless tobacco: Never Used  . Alcohol Use: No    Allergies  Allergen Reactions  . Cortisone Anaphylaxis  . Dilaudid [Hydromorphone Hcl] Nausea And Vomiting    Pt states she will start vomiting immediately for 6 hours  . Iodine Anaphylaxis  . Medrol [Methylprednisolone] Anaphylaxis  . Omnipaque [Iohexol] Anaphylaxis  . Prednisone Anaphylaxis  . Shellfish Allergy Anaphylaxis  . Sulfa Drugs Cross Reactors Anaphylaxis  . Augmentin [Amoxicillin-Pot Clavulanate]     Upset stomach  . Morphine And Related     nausea  . Codeine Rash  . Erythromycin Rash    Current Outpatient Prescriptions  Medication Sig Dispense Refill  . aspirin EC 81 MG tablet Take 81 mg by mouth daily.    Marland Kitchen atenolol (TENORMIN) 25 MG tablet Take 1 tablet (25 mg total) by mouth daily.  90 tablet 2  . atorvastatin (LIPITOR) 10 MG tablet Take 1 tablet (10 mg total) by mouth every evening. 90 tablet 2  . candesartan (ATACAND) 16 MG tablet Take 8 mg by mouth. Take daily at lunch.    . cholecalciferol (VITAMIN D) 1000 UNITS tablet Take 1,000 Units by mouth daily.    . clopidogrel (PLAVIX) 75 MG tablet Take 1 tablet (75 mg total) by mouth daily. 90 tablet 2  . clorazepate (TRANXENE) 7.5 MG tablet Take 1 tablet (7.5 mg total) by mouth daily as needed. 90 tablet 1  . cyanocobalamin 1000 MCG tablet Take  100 mcg by mouth daily.    Marland Kitchen EPINEPHrine (EPIPEN 2-PAK) 0.3 mg/0.3 mL DEVI Inject 0.3 mg into the muscle as needed (for allergic reaction).    Marland Kitchen esomeprazole (NEXIUM) 40 MG capsule Take 1 capsule (40 mg total) by mouth daily. Take 1 tab daily 90 capsule 0  . estradiol (ESTRACE) 0.5 MG tablet TAKE 1 TABLET DAILY 90 tablet 0  . furosemide (LASIX) 20 MG tablet Take 1 tablet (20 mg total) by mouth every other day. 45 tablet 2  . meclizine (ANTIVERT) 25 MG tablet Take 0.5 tablets (12.5 mg total) by mouth 3 (three) times daily as needed for dizziness or nausea. 90 tablet 4  . Multiple Vitamin (MULTIVITAMIN WITH MINERALS) TABS Take 1 tablet by mouth daily.    . polyethylene glycol (MIRALAX / GLYCOLAX) packet Take 17 g by mouth 2 (two) times daily. Until stooling regularly (Patient taking differently: Take 8 g by mouth once. Until stooling regularly) 30 packet 11  . zolpidem (AMBIEN) 10 MG tablet Take 1 tablet (10 mg total) by mouth at bedtime as needed for sleep. 90 tablet 0  . azelastine (ASTELIN) 0.1 % nasal spray Place 2 sprays into both nostrils 2 (two) times daily. Use in each nostril as directed (Patient not taking: Reported on 08/30/2015) 30 mL 12   No current facility-administered medications for this visit.    REVIEW OF SYSTEMS:  [X]  denotes positive finding, [ ]  denotes negative finding Cardiac  Comments:  Chest pain or chest pressure:    Shortness of breath upon exertion:    Short of breath when lying flat:    Irregular heart rhythm:        Vascular    Pain in calf, thigh, or hip brought on by ambulation:    Pain in feet at night that wakes you up from your sleep:     Blood clot in your veins:    Leg swelling:         Pulmonary    Oxygen at home:    Productive cough:     Wheezing:         Neurologic    Sudden weakness in arms or legs:  X Intermittent paresthesias left arm.  Sudden numbness in arms or legs:     Sudden onset of difficulty speaking or slurred speech:    Temporary  loss of vision in one eye:     Problems with dizziness:         Gastrointestinal    Blood in stool:     Vomited blood:         Genitourinary    Burning when urinating:     Blood in urine:        Psychiatric    Major depression:         Hematologic    Bleeding problems:    Problems with blood clotting too easily:  Skin    Rashes or ulcers:        Constitutional    Fever or chills:      PHYSICAL EXAM: Filed Vitals:   08/30/15 1223  BP: 121/52  Pulse: 65  Temp: 97.3 F (36.3 C)  TempSrc: Oral  Resp: 16  Height: 5\' 4"  (1.626 m)  Weight: 149 lb (67.586 kg)  SpO2: 99%    GENERAL: The patient is a well-nourished female, in no acute distress. The vital signs are documented above. CARDIAC: There is a regular rate and rhythm.  VASCULAR: She does have a right carotid bruit. She has an easily palpable left radial pulse. She has palpable femoral, popliteal, and posterior tibial pulses bilaterally. He has no significant lower extremity swelling. PULMONARY: There is good air exchange bilaterally without wheezing or rales. ABDOMEN: Soft and non-tender with normal pitched bowel sounds.  MUSCULOSKELETAL: There are no major deformities or cyanosis. NEUROLOGIC: No focal weakness or paresthesias are detected. SKIN: There are no ulcers or rashes noted. PSYCHIATRIC: The patient has a normal affect.  DATA:   CAROTID DUPLEX: Given her right carotid bruit with intermittent symptoms in her left arm I did get a carotid duplex scan today. This shows no significant carotid disease bilaterally. Both vertebral arteries are patent with antegrade flow. Her left carotid subclavian bypass graft is patent.  MEDICAL ISSUES:  STATUS POST AORTO SMA BYPASS: She has no postprandial abdominal pain and has gained weight. Therefore, I think her bypass graft is working fine.  INTERMITTENT PARESTHESIAS LEFT ARM: Given that she has a strong left radial pulse I think her left carotid subclavian  bypass graft is patent. However I did obtain a carotid duplex scan today which shows no significant carotid disease. She does have a history of some disc problems and if the symptoms progress she will follow up with her spine surgeon Dr. Vertell Limber.    [name] [facility] [place]: [phone]

## 2015-08-31 NOTE — Addendum Note (Signed)
Addended by: Reola Calkins on: 08/31/2015 02:58 PM   Modules accepted: Orders

## 2015-09-22 ENCOUNTER — Ambulatory Visit: Payer: Medicare Other | Admitting: Physician Assistant

## 2015-09-22 DIAGNOSIS — H1132 Conjunctival hemorrhage, left eye: Secondary | ICD-10-CM | POA: Diagnosis not present

## 2015-10-02 ENCOUNTER — Telehealth: Payer: Self-pay

## 2015-10-02 DIAGNOSIS — K581 Irritable bowel syndrome with constipation: Secondary | ICD-10-CM | POA: Diagnosis not present

## 2015-10-02 DIAGNOSIS — R140 Abdominal distension (gaseous): Secondary | ICD-10-CM | POA: Diagnosis not present

## 2015-10-02 MED ORDER — EPINEPHRINE 0.3 MG/0.3ML IJ SOAJ: 0.3000 mg | INTRAMUSCULAR | Status: DC | PRN

## 2015-10-02 NOTE — Telephone Encounter (Signed)
She is asking for a refill on her epi pens. She is traveling out of the country.

## 2015-10-02 NOTE — Telephone Encounter (Signed)
[place], Rx placed in in-box ready for pickup/faxing.  

## 2015-10-02 NOTE — Telephone Encounter (Signed)
Pt.notified

## 2015-10-20 ENCOUNTER — Other Ambulatory Visit: Payer: Self-pay | Admitting: Family Medicine

## 2015-10-20 DIAGNOSIS — Z1231 Encounter for screening mammogram for malignant neoplasm of breast: Secondary | ICD-10-CM

## 2015-10-25 DIAGNOSIS — H35371 Puckering of macula, right eye: Secondary | ICD-10-CM | POA: Diagnosis not present

## 2015-10-25 DIAGNOSIS — H43391 Other vitreous opacities, right eye: Secondary | ICD-10-CM | POA: Diagnosis not present

## 2015-10-25 DIAGNOSIS — H35431 Paving stone degeneration of retina, right eye: Secondary | ICD-10-CM | POA: Diagnosis not present

## 2015-10-25 DIAGNOSIS — H43813 Vitreous degeneration, bilateral: Secondary | ICD-10-CM | POA: Diagnosis not present

## 2015-10-30 ENCOUNTER — Other Ambulatory Visit: Payer: Self-pay | Admitting: Family Medicine

## 2015-10-31 ENCOUNTER — Other Ambulatory Visit: Payer: Self-pay | Admitting: Cardiovascular Disease

## 2015-10-31 NOTE — Telephone Encounter (Signed)
REFILL 

## 2015-11-07 ENCOUNTER — Encounter: Payer: Self-pay | Admitting: Family Medicine

## 2015-11-07 ENCOUNTER — Ambulatory Visit (INDEPENDENT_AMBULATORY_CARE_PROVIDER_SITE_OTHER): Payer: Medicare Other | Admitting: Family Medicine

## 2015-11-07 VITALS — BP 115/70 | HR 68 | Temp 98.0°F | Ht 64.0 in | Wt 151.0 lb

## 2015-11-07 DIAGNOSIS — I2510 Atherosclerotic heart disease of native coronary artery without angina pectoris: Secondary | ICD-10-CM

## 2015-11-07 DIAGNOSIS — R509 Fever, unspecified: Secondary | ICD-10-CM | POA: Diagnosis not present

## 2015-11-07 NOTE — Progress Notes (Signed)
CC: Traci [name] is a 70 y.o. female is here for Sinus Problem   Subjective: HPI:  Fatigue, subjective fever, decreased energy that has been present for the past 3 weeks. Symptoms came on a day or 2 after she returned from [place]. She felt great when she was out of the country. No interventions as of yet. She also complains that she started to get out of breath much easier. She is having a decreased appetite over the past week and has been getting constipated. She feels bloated. She denies any rashes, nasal congestion, ear pain, confusion, headache, joint pain, nor coughing. Symptoms are persistent throughout the day. No activity seems to make it better or worse. She's not tried any interventions as of yet.  She's never been tested for hepatitis C   Review Of Systems Outlined In HPI  Past Medical History  Diagnosis Date  . Hypertension 11/30/2010    echo- EF 55%; normal w/ mildly sclerotic aortic valve  . Hyperlipidemia   . Coronary artery disease   . GERD (gastroesophageal reflux disease)   . Osteopenia   . Sigmoid diverticulitis   . Peripheral vascular disease ([place])   . Atrial fibrillation ([place])   . Nonspecific ST-T wave electrocardiographic changes 03/26/2011    R/Lmv - EF 74%, normal perfusion all regions, ST depression w/ Lexiscan infusion w/o assoc angina  . CAD (coronary artery disease)   . PAD (peripheral artery disease) ([place])   . PVD (peripheral vascular disease) ([place]) 11/05/2011    doppler - R/L brachial pressures essentially equal w/o inflow disease; L sublclavian/CCA bypass graft demonstrates patent flow, no evidence of significant stenosis  . Hypertension 11/05/11    renal dopplers - celiac artery and SMA >50% diameter reduction, R renal artery - mildly elevated velocities 1-59% diameter reduction, L renal artery normal  . Claudication ([place]) 01/06/2008    Lower extremity dopplers - no evidence of arterial insufficiency, normal exam  . Anxiety     Past Surgical  History  Procedure Laterality Date  . Cholecystectomy    . Appendectomy    . Abdominal hysterectomy    . Subclavian artery stents  01/23/2010    Left carotid to subclavian artery Bypass  . Spine surgery  Feb. 2011  . Subclavian artery stent  1995    X's  several  . Subclavian artery stent Left 09/20/2008    LSA ISR 7x27mm Cordis Genesis on Opta premount, reduction from 80% to 0%  . Cardiac catheterization  06/03/2008    60% LAD, involving D2, borderline significant by IVUS, medical therapy. CFX, RCA OK.  . Eye surgery      retinal surgery  . Abdominal aortic aneurysm repair N/A 01/27/2014    Procedure: AORTO-SUPERIOR MESENTERIC ARTERY BYPASS GRAFT;  Surgeon: Angelia Mould, MD;  Location: [place] [facility];  Service: Vascular;  Laterality: N/A;  . Visceral angiogram  01/17/2014    Procedure: VISCERAL ANGIOGRAM;  Surgeon: Angelia Mould, MD;  Location: [facility] LAB;  Service: Cardiovascular;;   Family History  Problem Relation Age of Onset  . Heart disease Father     Heart Disease before age 49  . Kidney disease Father   . Heart attack Father   . Hyperlipidemia Father   . Hypertension Father   . Diabetes Mother     Varicose Veins  . Diabetes Maternal Grandmother   . Heart disease Paternal Grandfather     Social History   Social History  . Marital Status: Legally Separated  Spouse Name: N/A  . Number of Children: N/A  . Years of Education: N/A   Occupational History  . Not on file.   Social History Main Topics  . Smoking status: Former Smoker    Types: Cigarettes    Quit date: 09/30/1993  . Smokeless tobacco: Never Used  . Alcohol Use: No  . Drug Use: No  . Sexual Activity: Not on file   Other Topics Concern  . Not on file   Social History Narrative     Objective: BP 115/70 mmHg  Pulse 68  Temp(Src) 98 F (36.7 C) (Oral)  Ht 5\' 4"  (1.626 m)  Wt 151 lb (68.493 kg)  BMI 25.91 kg/m2  General: Alert and Oriented, No Acute Distress HEENT: Pupils equal,  round, reactive to light. Conjunctivae clear.  Moist mucous membrane spasm unremarkable Lungs: Clear to auscultation bilaterally, no wheezing/ronchi/rales.  Comfortable work of breathing. Good air movement. Cardiac: Regular rate and rhythm. Normal S1/S2.  No murmurs, rubs, nor gallops.   Abdomen: Soft nontender Extremities: No peripheral edema.  Strong peripheral pulses.  Mental Status: No depression, anxiety, nor agitation. Skin: Warm and dry.  Assessment & Plan: Traci [name] was seen today for sinus problem.  Diagnoses and all orders for this visit:  Fever, unspecified fever cause -     COMPLETE METABOLIC PANEL WITH GFR -     CBC w/Diff/Platelet -     Hepatitis C antibody   Differential remains broad, labs above to look for signs that would be suggestive of getting a mosquito virus overseas. If normal will treat as sinusitis.   Return if symptoms worsen or fail to improve.

## 2015-11-08 ENCOUNTER — Telehealth: Payer: Self-pay | Admitting: Family Medicine

## 2015-11-08 LAB — CBC WITH DIFFERENTIAL/PLATELET
BASOS ABS: 51 {cells}/uL (ref 0–200)
Basophils Relative: 1 %
EOS ABS: 357 {cells}/uL (ref 15–500)
EOS PCT: 7 %
HCT: 36.4 % (ref 35.0–45.0)
Hemoglobin: 11.7 g/dL (ref 11.7–15.5)
LYMPHS PCT: 45 %
Lymphs Abs: 2295 cells/uL (ref 850–3900)
MCH: 27.5 pg (ref 27.0–33.0)
MCHC: 32.1 g/dL (ref 32.0–36.0)
MCV: 85.4 fL (ref 80.0–100.0)
MONOS PCT: 9 %
MPV: 9.8 fL (ref 7.5–12.5)
Monocytes Absolute: 459 cells/uL (ref 200–950)
NEUTROS ABS: 1938 {cells}/uL (ref 1500–7800)
Neutrophils Relative %: 38 %
PLATELETS: 319 10*3/uL (ref 140–400)
RBC: 4.26 MIL/uL (ref 3.80–5.10)
RDW: 14.2 % (ref 11.0–15.0)
WBC: 5.1 10*3/uL (ref 3.8–10.8)

## 2015-11-08 LAB — COMPLETE METABOLIC PANEL WITH GFR
ALBUMIN: 3.9 g/dL (ref 3.6–5.1)
ALK PHOS: 60 U/L (ref 33–130)
ALT: 19 U/L (ref 6–29)
AST: 28 U/L (ref 10–35)
BILIRUBIN TOTAL: 0.4 mg/dL (ref 0.2–1.2)
BUN: 12 mg/dL (ref 7–25)
CO2: 28 mmol/L (ref 20–31)
CREATININE: 0.9 mg/dL (ref 0.50–0.99)
Calcium: 8.3 mg/dL — ABNORMAL LOW (ref 8.6–10.4)
Chloride: 104 mmol/L (ref 98–110)
GFR, EST AFRICAN AMERICAN: 75 mL/min (ref >=60)
GFR, EST NON AFRICAN AMERICAN: 65 mL/min (ref >=60)
GLUCOSE: 110 mg/dL — AB (ref 65–99)
Potassium: 4.5 mmol/L (ref 3.5–5.3)
Sodium: 140 mmol/L (ref 135–146)
TOTAL PROTEIN: 6.5 g/dL (ref 6.1–8.1)

## 2015-11-08 LAB — HEPATITIS C ANTIBODY: HCV AB: NEGATIVE

## 2015-11-08 MED ORDER — CEFDINIR 300 MG PO CAPS: 300.0000 mg | ORAL_CAPSULE | Freq: Two times a day (BID) | ORAL | Status: AC

## 2015-11-08 MED ORDER — CEFDINIR 300 MG PO CAPS: 300.0000 mg | ORAL_CAPSULE | Freq: Two times a day (BID) | ORAL | Status: DC

## 2015-11-08 NOTE — Telephone Encounter (Signed)
Will you please let patient know that her kidney function, liver function, and blood cell counts were normal.  I'd recommend that she try a ten day course of cefdinir to see if this helps her symptoms. Rx sent to [facility] on [address].

## 2015-11-08 NOTE — Telephone Encounter (Signed)
Pt.notified

## 2015-11-17 DIAGNOSIS — K5904 Chronic idiopathic constipation: Secondary | ICD-10-CM | POA: Diagnosis not present

## 2015-11-19 ENCOUNTER — Other Ambulatory Visit: Payer: Self-pay | Admitting: Cardiovascular Disease

## 2015-11-20 NOTE — Telephone Encounter (Signed)
Rx(s) sent to pharmacy electronically.  

## 2015-11-22 ENCOUNTER — Ambulatory Visit (INDEPENDENT_AMBULATORY_CARE_PROVIDER_SITE_OTHER): Payer: Medicare Other

## 2015-11-22 ENCOUNTER — Encounter: Payer: Self-pay | Admitting: Family Medicine

## 2015-11-22 DIAGNOSIS — Z1231 Encounter for screening mammogram for malignant neoplasm of breast: Secondary | ICD-10-CM

## 2015-11-29 ENCOUNTER — Other Ambulatory Visit: Payer: Self-pay | Admitting: Family Medicine

## 2015-11-29 DIAGNOSIS — Z780 Asymptomatic menopausal state: Secondary | ICD-10-CM

## 2015-11-29 NOTE — Progress Notes (Signed)
Pt requested 2 year follow up.

## 2015-12-08 ENCOUNTER — Encounter: Payer: Self-pay | Admitting: Family Medicine

## 2015-12-08 ENCOUNTER — Ambulatory Visit (INDEPENDENT_AMBULATORY_CARE_PROVIDER_SITE_OTHER): Payer: Medicare Other | Admitting: Family Medicine

## 2015-12-08 VITALS — BP 131/78 | HR 55 | Temp 98.1°F | Wt 150.0 lb

## 2015-12-08 DIAGNOSIS — H6502 Acute serous otitis media, left ear: Secondary | ICD-10-CM | POA: Diagnosis not present

## 2015-12-08 DIAGNOSIS — Z780 Asymptomatic menopausal state: Secondary | ICD-10-CM | POA: Diagnosis not present

## 2015-12-08 DIAGNOSIS — I2510 Atherosclerotic heart disease of native coronary artery without angina pectoris: Secondary | ICD-10-CM

## 2015-12-08 MED ORDER — DOXYCYCLINE HYCLATE 100 MG PO TABS: ORAL_TABLET | ORAL | Status: AC

## 2015-12-08 MED ORDER — ESTRADIOL 0.5 MG PO TABS: 0.5000 mg | ORAL_TABLET | Freq: Every day | ORAL | Status: DC

## 2015-12-08 NOTE — Progress Notes (Signed)
CC: Traci [name] is a 70 y.o. female is here for Lymphadenopathy and Sore Throat   Subjective: HPI:  Sore throat mild in severity present for the last 2-3 days. Accompanied by left ear fullness and decreased hearing. She reports fevers and chills subjectively. Symptoms are slowly worsening. She's also had some fatigue the point where she is no longer interested in doing her daily walking regimen. She denies cough, wheezing but has had a decrease in her voice quality. She denies any unintentional weight loss or gain. She denies any facial pressure or nasal congestion  She would like a refill of estrogen that she is taking to help reduce hot flashes    Review Of Systems Outlined In HPI  Past Medical History  Diagnosis Date  . Hypertension 11/30/2010    echo- EF 55%; normal w/ mildly sclerotic aortic valve  . Hyperlipidemia   . Coronary artery disease   . GERD (gastroesophageal reflux disease)   . Osteopenia   . Sigmoid diverticulitis   . Peripheral vascular disease ([place])   . Atrial fibrillation ([place])   . Nonspecific ST-T wave electrocardiographic changes 03/26/2011    R/Lmv - EF 74%, normal perfusion all regions, ST depression w/ Lexiscan infusion w/o assoc angina  . CAD (coronary artery disease)   . PAD (peripheral artery disease) ([place])   . PVD (peripheral vascular disease) ([place]) 11/05/2011    doppler - R/L brachial pressures essentially equal w/o inflow disease; L sublclavian/CCA bypass graft demonstrates patent flow, no evidence of significant stenosis  . Hypertension 11/05/11    renal dopplers - celiac artery and SMA >50% diameter reduction, R renal artery - mildly elevated velocities 1-59% diameter reduction, L renal artery normal  . Claudication ([place]) 01/06/2008    Lower extremity dopplers - no evidence of arterial insufficiency, normal exam  . Anxiety     Past Surgical History  Procedure Laterality Date  . Cholecystectomy    . Appendectomy    . Abdominal  hysterectomy    . Subclavian artery stents  01/23/2010    Left carotid to subclavian artery Bypass  . Spine surgery  Feb. 2011  . Subclavian artery stent  1995    X's  several  . Subclavian artery stent Left 09/20/2008    LSA ISR 7x36mm Cordis Genesis on Opta premount, reduction from 80% to 0%  . Cardiac catheterization  06/03/2008    60% LAD, involving D2, borderline significant by IVUS, medical therapy. CFX, RCA OK.  . Eye surgery      retinal surgery  . Abdominal aortic aneurysm repair N/A 01/27/2014    Procedure: AORTO-SUPERIOR MESENTERIC ARTERY BYPASS GRAFT;  Surgeon: Angelia Mould, MD;  Location: [place];  Service: Vascular;  Laterality: N/A;  . Visceral angiogram  01/17/2014    Procedure: VISCERAL ANGIOGRAM;  Surgeon: Angelia Mould, MD;  Location: [facility] CATH LAB;  Service: Cardiovascular;;   Family History  Problem Relation Age of Onset  . Heart disease Father     Heart Disease before age 75  . Kidney disease Father   . Heart attack Father   . Hyperlipidemia Father   . Hypertension Father   . Diabetes Mother     Varicose Veins  . Diabetes Maternal Grandmother   . Heart disease Paternal Grandfather     Social History   Social History  . Marital Status: Legally Separated    Spouse Name: N/A  . Number of Children: N/A  . Years of Education: N/A   Occupational History  .  Not on file.   Social History Main Topics  . Smoking status: Former Smoker    Types: Cigarettes    Quit date: 09/30/1993  . Smokeless tobacco: Never Used  . Alcohol Use: No  . Drug Use: No  . Sexual Activity: Not on file   Other Topics Concern  . Not on file   Social History Narrative     Objective: BP 131/78 mmHg  Pulse 55  Temp(Src) 98.1 F (36.7 C) (Oral)  Wt 150 lb (68.04 kg)  General: Alert and Oriented, No Acute Distress HEENT: Pupils equal, round, reactive to light. Conjunctivae clear.  External ears unremarkable, canals clear with intact TMs with appropriate  landmarks.Right  Middle ear appears open without effusion left middle ear with a serous effusion. Pink inferior turbinates.  Moist mucous membranes, pharynx without inflammation nor lesions.  Neck supple without palpable lymphadenopathy nor abnormal masses. Lungs: Clear comfortable work of breathing Cardiac: Regular rate and rhythm.  Extremities: No peripheral edema.  Strong peripheral pulses.  Mental Status: No depression, anxiety, nor agitation. Skin: Warm and dry.  Assessment & Plan: Traci [name] was seen today for lymphadenopathy and sore throat.  Diagnoses and all orders for this visit:  Acute serous otitis media of left ear, recurrence not specified  Menopause  Other orders -     Cancel: estradiol (ESTRACE) 0.5 MG tablet; Take 1 tablet (0.5 mg total) by mouth daily. NEED FOLLOW UP APPOINTMENT FOR MORE REFILLS -     doxycycline (VIBRA-TABS) 100 MG tablet; One by mouth twice a day for ten days. -     estradiol (ESTRACE) 0.5 MG tablet; Take 1 tablet (0.5 mg total) by mouth daily.   She's worried about some lymph nodes that she was able to feel in her neck yesterday, if no better after taking doxycycline for 48 hours am more than happy to order an ultrasound of her neck  No Follow-up on file.

## 2015-12-11 ENCOUNTER — Encounter: Payer: Self-pay | Admitting: Family Medicine

## 2015-12-11 ENCOUNTER — Ambulatory Visit (INDEPENDENT_AMBULATORY_CARE_PROVIDER_SITE_OTHER): Payer: Medicare Other

## 2015-12-11 ENCOUNTER — Ambulatory Visit (INDEPENDENT_AMBULATORY_CARE_PROVIDER_SITE_OTHER): Payer: Medicare Other | Admitting: Family Medicine

## 2015-12-11 ENCOUNTER — Telehealth: Payer: Self-pay

## 2015-12-11 VITALS — BP 120/74 | HR 57 | Wt 148.0 lb

## 2015-12-11 DIAGNOSIS — R221 Localized swelling, mass and lump, neck: Secondary | ICD-10-CM

## 2015-12-11 DIAGNOSIS — R509 Fever, unspecified: Secondary | ICD-10-CM

## 2015-12-11 DIAGNOSIS — R591 Generalized enlarged lymph nodes: Secondary | ICD-10-CM

## 2015-12-11 DIAGNOSIS — I2510 Atherosclerotic heart disease of native coronary artery without angina pectoris: Secondary | ICD-10-CM | POA: Diagnosis not present

## 2015-12-11 DIAGNOSIS — R599 Enlarged lymph nodes, unspecified: Secondary | ICD-10-CM

## 2015-12-11 NOTE — Progress Notes (Signed)
CC: Traci [name] is a 70 y.o. female is here for Results   Subjective: HPI:  Follow-up swelling of the neck: She still experiencing some pressure sensation on the left lateral aspect of the base of the neck. She had an ultrasound done this morning which was reassuring and did not show any abnormalities. She is taking doxycycline but doesn't think it's helping with her ear fullness. She notices that the swelling in her neck is almost absent first thing in the morning and as the day progresses slowly enlarges until she falls asleep. She denies any difficulty with swallowing solids or liquids. She believes her throat is slightly hoarse. She's had a nonproductive cough since I saw her last. She denies any fevers, chills, change in appetite or motor or sensory disturbances in the left upper extremity.   Review Of Systems Outlined In HPI  Past Medical History  Diagnosis Date  . Hypertension 11/30/2010    echo- EF 55%; normal w/ mildly sclerotic aortic valve  . Hyperlipidemia   . Coronary artery disease   . GERD (gastroesophageal reflux disease)   . Osteopenia   . Sigmoid diverticulitis   . Peripheral vascular disease ([place])   . Atrial fibrillation ([place])   . Nonspecific ST-T wave electrocardiographic changes 03/26/2011    R/Lmv - EF 74%, normal perfusion all regions, ST depression w/ Lexiscan infusion w/o assoc angina  . CAD (coronary artery disease)   . PAD (peripheral artery disease) ([place])   . PVD (peripheral vascular disease) ([place]) 11/05/2011    doppler - R/L brachial pressures essentially equal w/o inflow disease; L sublclavian/CCA bypass graft demonstrates patent flow, no evidence of significant stenosis  . Hypertension 11/05/11    renal dopplers - celiac artery and SMA >50% diameter reduction, R renal artery - mildly elevated velocities 1-59% diameter reduction, L renal artery normal  . Claudication ([place]) 01/06/2008    Lower extremity dopplers - no evidence of arterial  insufficiency, normal exam  . Anxiety     Past Surgical History  Procedure Laterality Date  . Cholecystectomy    . Appendectomy    . Abdominal hysterectomy    . Subclavian artery stents  01/23/2010    Left carotid to subclavian artery Bypass  . Spine surgery  Feb. 2011  . Subclavian artery stent  1995    X's  several  . Subclavian artery stent Left 09/20/2008    LSA ISR 7x49mm Cordis Genesis on Opta premount, reduction from 80% to 0%  . Cardiac catheterization  06/03/2008    60% LAD, involving D2, borderline significant by IVUS, medical therapy. CFX, RCA OK.  . Eye surgery      retinal surgery  . Abdominal aortic aneurysm repair N/A 01/27/2014    Procedure: AORTO-SUPERIOR MESENTERIC ARTERY BYPASS GRAFT;  Surgeon: Angelia Mould, MD;  Location: [place];  Service: Vascular;  Laterality: N/A;  . Visceral angiogram  01/17/2014    Procedure: VISCERAL ANGIOGRAM;  Surgeon: Angelia Mould, MD;  Location: [facility] CATH LAB;  Service: Cardiovascular;;   Family History  Problem Relation Age of Onset  . Heart disease Father     Heart Disease before age 76  . Kidney disease Father   . Heart attack Father   . Hyperlipidemia Father   . Hypertension Father   . Diabetes Mother     Varicose Veins  . Diabetes Maternal Grandmother   . Heart disease Paternal Grandfather     Social History   Social History  . Marital Status: Legally  Separated    Spouse Name: N/A  . Number of Children: N/A  . Years of Education: N/A   Occupational History  . Not on file.   Social History Main Topics  . Smoking status: Former Smoker    Types: Cigarettes    Quit date: 09/30/1993  . Smokeless tobacco: Never Used  . Alcohol Use: No  . Drug Use: No  . Sexual Activity: Not on file   Other Topics Concern  . Not on file   Social History Narrative     Objective: BP 120/74 mmHg  Pulse 57  Wt 148 lb (67.132 kg)  General: Alert and Oriented, No Acute Distress HEENT: Pupils equal, round, reactive to  light. Conjunctivae clear.  External ears unremarkable, canals clear with intact TMs with appropriate landmarks.  Middle ear appears open without effusion. Pink inferior turbinates.  Moist mucous membranes, pharynx without inflammation nor lesions.  Neck supple without palpable lymphadenopathy,  no pulsatile mass at the site where her swelling is localized. There is some mild fullness when compared to the right.  Lungs: Clear to auscultation bilaterally, no wheezing/ronchi/rales.  Comfortable work of breathing. Good air movement. Cardiac: Regular rate and rhythm. Normal S1/S2.  No murmurs, rubs, nor gallops.   Extremities: No peripheral edema.  Strong peripheral pulsesIn the upper extremities.  Mental Status: No depression, anxiety, nor agitation. Skin: Warm and dry.  Assessment & Plan: [name] was seen today for results.  Diagnoses and all orders for this visit:  Neck swelling   Swelling: Reassurance provided that the ultrasound is favorable.  Joint decision to try increasing furosemide to 40 mg every other day instead of 20 mg Monday Wednesday Friday. If this doesn't help him going to refer her to ear nose and throat for a second opinion, I admitted that I'm somewhat scratching my head at what exactly is causing this.   No Follow-up on file.

## 2015-12-11 NOTE — Telephone Encounter (Signed)
Pt has an appointment with you today at 2 and wants to know should she have her neck ultrasound before her appointment?

## 2015-12-11 NOTE — Telephone Encounter (Signed)
Pt notified and would like to keep her appointment

## 2015-12-11 NOTE — Telephone Encounter (Signed)
I've just now put in an order for this.  Results can sometimes take up to a day to get back so it's up to her when she wants to get this done.  An appointment is not necessarily needed with me today since I won't have results back to act on.  It's up to her if she wants to keep the 2pm appointment.  (Please give her the number to radiology for scheduling)

## 2015-12-13 ENCOUNTER — Encounter: Payer: Self-pay | Admitting: Family Medicine

## 2015-12-13 ENCOUNTER — Ambulatory Visit (INDEPENDENT_AMBULATORY_CARE_PROVIDER_SITE_OTHER): Payer: Medicare Other

## 2015-12-13 DIAGNOSIS — M8580 Other specified disorders of bone density and structure, unspecified site: Secondary | ICD-10-CM | POA: Insufficient documentation

## 2015-12-13 DIAGNOSIS — Z780 Asymptomatic menopausal state: Secondary | ICD-10-CM

## 2015-12-13 DIAGNOSIS — M8588 Other specified disorders of bone density and structure, other site: Secondary | ICD-10-CM | POA: Diagnosis not present

## 2015-12-25 ENCOUNTER — Other Ambulatory Visit: Payer: Self-pay

## 2015-12-25 DIAGNOSIS — G4700 Insomnia, unspecified: Secondary | ICD-10-CM

## 2015-12-25 MED ORDER — ZOLPIDEM TARTRATE 10 MG PO TABS: 10.0000 mg | ORAL_TABLET | Freq: Every evening | ORAL | Status: DC | PRN

## 2016-01-29 ENCOUNTER — Encounter: Payer: Self-pay | Admitting: Cardiovascular Disease

## 2016-01-29 ENCOUNTER — Ambulatory Visit (INDEPENDENT_AMBULATORY_CARE_PROVIDER_SITE_OTHER): Payer: Medicare Other | Admitting: Cardiovascular Disease

## 2016-01-29 VITALS — BP 126/64 | HR 61 | Ht 63.0 in | Wt 153.0 lb

## 2016-01-29 DIAGNOSIS — E785 Hyperlipidemia, unspecified: Secondary | ICD-10-CM | POA: Diagnosis not present

## 2016-01-29 DIAGNOSIS — I771 Stricture of artery: Secondary | ICD-10-CM

## 2016-01-29 DIAGNOSIS — K551 Chronic vascular disorders of intestine: Secondary | ICD-10-CM

## 2016-01-29 DIAGNOSIS — I2510 Atherosclerotic heart disease of native coronary artery without angina pectoris: Secondary | ICD-10-CM | POA: Diagnosis not present

## 2016-01-29 NOTE — Progress Notes (Signed)
Patient ID: Traci [name], female   DOB: 01-20-1946, 70 y.o.   MRN: [id number]    Cardiology Office Note    Date:  01/29/2016   ID:  Traci [name], DOB [birth year], MRN [id number]  PCP:  Lynne Leader, MD  Cardiologist:   Sanda Klein, MD   No chief complaint on file.   History of Present Illness:  Traci [name] is a 70 y.o. female with an extensive history of peripheral arterial disease involving the mesenteric circulation and upper extremities. She is here for routine follow-up. She does not have angina, dyspnea or claudication. Does complain of palpitations that happen uniformly when she goes to bed at night and do not generally bother her during the day. She refers to her palpitation as "A. fib", but I don't find any confirmation of this diagnosis in her records. She also complains of a peculiar sensation of swelling in her lower chest that happens towards the end of the day. Her rib cage seems to be tender. By the time she wakes up in the morning things are back to normal.  Normal ankle brachial indices by ultrasound Dec 2016. Her last carotid and upper extremity arterial duplex was performed in March 2017 and showed a patent carotid subclavian bypass on the left and <40% bilateral carotid plaque. Today there is no difference in the blood pressures in her left and right upper extremities.  Traci [name] has long-standing history of peripheral arterial disease with previous stents and surgical revascularization of the left subclavian artery for subclavian steal syndrome and presyncope. She eventually underwent a left carotid to subclavian bypass.  She underwent a bypass from the aorta to the chronically totally occluded superficial mesenteric artery due to symptoms of chronic mesenteric ischemia, (Dr. Scot Dock August 2015 6 mm Dacron graft) She had moderate CAD by coronary angiography performed in 2009. Normal left ventricular systolic function and no  perfusion abnormalities by echo 2015 and nuclear stress test performed in 2014.   Past Medical History:  Diagnosis Date  . Anxiety   . Atrial fibrillation ([place])   . CAD (coronary artery disease)   . Claudication ([place]) 01/06/2008   Lower extremity dopplers - no evidence of arterial insufficiency, normal exam  . Coronary artery disease   . GERD (gastroesophageal reflux disease)   . Hyperlipidemia   . Hypertension 11/30/2010   echo- EF 55%; normal w/ mildly sclerotic aortic valve  . Hypertension 11/05/11   renal dopplers - celiac artery and SMA >50% diameter reduction, R renal artery - mildly elevated velocities 1-59% diameter reduction, L renal artery normal  . Nonspecific ST-T wave electrocardiographic changes 03/26/2011   R/Lmv - EF 74%, normal perfusion all regions, ST depression w/ Lexiscan infusion w/o assoc angina  . Osteopenia   . PAD (peripheral artery disease) ([place])   . Peripheral vascular disease ([place])   . PVD (peripheral vascular disease) ([place]) 11/05/2011   doppler - R/L brachial pressures essentially equal w/o inflow disease; L sublclavian/CCA bypass graft demonstrates patent flow, no evidence of significant stenosis  . Sigmoid diverticulitis     Past Surgical History:  Procedure Laterality Date  . ABDOMINAL AORTIC ANEURYSM REPAIR N/A 01/27/2014   Procedure: AORTO-SUPERIOR MESENTERIC ARTERY BYPASS GRAFT;  Surgeon: Angelia Mould, MD;  Location: [facility];  Service: Vascular;  Laterality: N/A;  . ABDOMINAL HYSTERECTOMY    . APPENDECTOMY    . CARDIAC CATHETERIZATION  06/03/2008   60% LAD, involving D2, borderline significant by IVUS, medical therapy. CFX, RCA OK.  Traci [name]  CHOLECYSTECTOMY    . EYE SURGERY     retinal surgery  . SPINE SURGERY  Feb. 2011  . [place]   X's  several  . SUBCLAVIAN ARTERY STENT Left 09/20/2008   LSA ISR 7x19mm Cordis Genesis on Opta premount, reduction from 80% to 0%  . subclavian artery stents  01/23/2010   Left carotid to  subclavian artery Bypass  . VISCERAL ANGIOGRAM  01/17/2014   Procedure: VISCERAL ANGIOGRAM;  Surgeon: Angelia Mould, MD;  Location[facility] C[facility] LAB;  Service: Cardiovascular;;    Outpatient Medications Prior to Visit  Medication Sig Dispense Refill  . aspirin EC 81 MG tablet Take 81 mg by mouth daily.    . ATACAND 16 MG tablet TAKE ONE-HALF (1/2) TABLET EVERY MORNING 45 tablet 3  . atenolol (TENORMIN) 25 MG tablet TAKE 1 TABLET DAILY 90 tablet 3  . atorvastatin (LIPITOR) 10 MG tablet TAKE 1 TABLET EVERY EVENING 90 tablet 3  . cholecalciferol (VITAMIN D) 1000 UNITS tablet Take 1,000 Units by mouth daily.    . clopidogrel (PLAVIX) 75 MG tablet TAKE 1 TABLET DAILY 90 tablet 3  . clorazepate (TRANXENE) 7.5 MG tablet Take 1 tablet (7.5 mg total) by mouth daily as needed. 90 tablet 1  . cyanocobalamin 1000 MCG tablet Take 100 mcg by mouth daily.    Traci [name] EPINEPHrine (EPIPEN 2-PAK) 0.3 mg/0.3 mL IJ SOAJ injection Inject 0.3 mLs (0.3 mg total) into the muscle as needed (for allergic reaction). 2 Device 0  . esomeprazole (NEXIUM) 40 MG capsule Take 1 capsule (40 mg total) by mouth daily. Take 1 tab daily 90 capsule 0  . estradiol (ESTRACE) 0.5 MG tablet Take 1 tablet (0.5 mg total) by mouth daily. 90 tablet 1  . furosemide (LASIX) 20 MG tablet TAKE 1 TABLET EVERY OTHER DAY 45 tablet 3  . meclizine (ANTIVERT) 25 MG tablet Take 0.5 tablets (12.5 mg total) by mouth 3 (three) times daily as needed for dizziness or nausea. 90 tablet 4  . Multiple Vitamin (MULTIVITAMIN WITH MINERALS) TABS Take 1 tablet by mouth daily.    . polyethylene glycol (MIRALAX / GLYCOLAX) packet Take 17 g by mouth 2 (two) times daily. Until stooling regularly (Patient taking differently: Take 8 g by mouth once. Until stooling regularly) 30 packet 11  . zolpidem (AMBIEN) 10 MG tablet Take 1 tablet (10 mg total) by mouth at bedtime as needed for sleep. 90 tablet 0   No facility-administered medications prior to visit.      Allergies:    Cortisone; Dilaudid [hydromorphone hcl]; Iodine; Medrol [methylprednisolone]; Omnipaque [iohexol]; Prednisone; Shellfish allergy; Sulfa drugs cross reactors; Augmentin [amoxicillin-pot clavulanate]; Morphine and related; Codeine; and Erythromycin   Social History   Social History  . Marital status: Legally Separated    Spouse name: N/A  . Number of children: N/A  . Years of education: N/A   Social History Main Topics  . Smoking status: Former Smoker    Types: Cigarettes    Quit date: 09/30/1993  . Smokeless tobacco: Never Used  . Alcohol use No  . Drug use: No  . Sexual activity: Not on file   Other Topics Concern  . Not on file   Social History Narrative  . No narrative on file     Family History:  The patient's family history includes Diabetes in her maternal grandmother and mother; Heart attack in her father; Heart disease in her father and paternal grandfather; Hyperlipidemia in her father; Hypertension in  her father; Kidney disease in her father.   ROS:   Please see the history of present illness.    ROS All other systems reviewed and are negative.   PHYSICAL EXAM:   VS:  BP 126/64   Pulse 61   Ht 5\' 3"  (1.6 m)   Wt 153 lb (69.4 kg)   LMP  (Exact Date)   BMI 27.10 kg/m    Equal round left upper extremity blood pressures GEN: Well nourished, well developed, in no acute distress  HEENT: normal  Neck: no JVD, faint bilateral carotid bruits, no carotid delay or masses; scar of previous carotid-subclavian bypass surgery. She has bilateral subclavian bruits. Cardiac: RRR; no murmurs, rubs, or gallops,no edema  Respiratory:  clear to auscultation bilaterally, normal work of breathing GI: soft, nontender, nondistended, + BS MS: no deformity or atrophy  Skin: warm and dry, no rash Neuro:  Alert and Oriented x 3, Strength and sensation are intact Psych: euthymic mood, full affect  Wt Readings from Last 3 Encounters:  01/29/16 153 lb (69.4 kg)  12/11/15 148 lb (67.1  kg)  12/08/15 150 lb (68 kg)      Studies/Labs Reviewed:   EKG:  EKG is ordered today.  The ekg  demonstrates normal sinus rhythm with T wave changes   Recent Labs: 03/12/2015: B Natriuretic Peptide 64.6 11/07/2015: ALT 19; BUN 12; Creat 0.90; Hemoglobin 11.7; Platelets 319; Potassium 4.5; Sodium 140   Lipid Panel    Component Value Date/Time   CHOL 145 09/20/2014 0921   CHOL 141 04/07/2013 0857   TRIG 145 09/20/2014 0921   HDL 48 09/20/2014 0921   HDL 47 04/07/2013 0857   CHOLHDL 3.0 09/20/2014 0921   VLDL 29 09/20/2014 0921   LDLCALC 68 09/20/2014 0921   LDLCALC 67 04/07/2013 0857    ASSESSMENT:    1. 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 Hyperlipidemia      PLAN:  In order of problems listed above:  1. CAD: Asymptomatic. No angina pectoris. She had a 50-60%LAD lesion at that time. No perfusion abnormalities on nuclear imaging 2014. I told her that as long as she is physically active and asymptomatic routine stress testing is not indicated as it will not change her management. 2. 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 I don't think her unusual rib swelling/tenderness has anything to do this 3. L SCL artery occlusion: Status post carotid subclavian bypass widely patent by recent ultrasound, no claudication 4. HLP: Excellent lipid profile on current regimen with LDL less than 70, but have not rechecked in the last 16 months.    Medication Adjustments/Labs and Tests Ordered: Current medicines are reviewed at length with the patient today.  Concerns regarding medicines are outlined above.  Medication changes, Labs and Tests ordered today are listed in the Patient Instructions below. Patient Instructions  Dr Sallyanne Kuster recommends that you continue on your current medications as directed. Please refer to the Current Medication list given to you  today.  Your physician recommends that you return for lab work at your earliest [place].  Dr Sallyanne Kuster recommends that you schedule a follow-up appointment in 12 months. You will receive a reminder letter in the mail two months in advance. If you don't receive a letter, please call our office to schedule the follow-up appointment.  If you need a refill on your cardiac medications  before your next appointment, please call your pharmacy.      Signed, Sanda Klein, MD  01/29/2016 4:39 PM    [address], [place], [place]  [address] Phone: [phone]; Fax: [phone]

## 2016-01-29 NOTE — Patient Instructions (Signed)
Dr Croitoru recommends that you continue on your current medications as directed. Please refer to the Current Medication list given to you today.  Your physician recommends that you return for lab work at your earliest convenience - FASTING.  Dr Croitoru recommends that you schedule a follow-up appointment in 12 months. You will receive a reminder letter in the mail two months in advance. If you don't receive a letter, please call our office to schedule the follow-up appointment.  If you need a refill on your cardiac medications before your next appointment, please call your pharmacy. 

## 2016-02-07 DIAGNOSIS — E785 Hyperlipidemia, unspecified: Secondary | ICD-10-CM | POA: Diagnosis not present

## 2016-02-07 LAB — LIPID PANEL
Cholesterol: 126 mg/dL (ref 125–200)
HDL: 46 mg/dL (ref >=46)
LDL CALC: 54 mg/dL (ref <130)
TRIGLYCERIDES: 130 mg/dL (ref <150)
Total CHOL/HDL Ratio: 2.7 Ratio (ref <=5.0)
VLDL: 26 mg/dL (ref <30)

## 2016-02-08 ENCOUNTER — Other Ambulatory Visit: Payer: Self-pay

## 2016-02-08 ENCOUNTER — Other Ambulatory Visit: Payer: Self-pay | Admitting: *Deleted

## 2016-02-08 DIAGNOSIS — G4700 Insomnia, unspecified: Secondary | ICD-10-CM

## 2016-02-08 MED ORDER — ATENOLOL 25 MG PO TABS: 25.0000 mg | ORAL_TABLET | Freq: Every day | ORAL | 3 refills | Status: DC

## 2016-02-08 MED ORDER — CLORAZEPATE DIPOTASSIUM 7.5 MG PO TABS: 7.5000 mg | ORAL_TABLET | Freq: Every day | ORAL | 0 refills | Status: DC | PRN

## 2016-02-08 MED ORDER — MECLIZINE HCL 25 MG PO TABS: 12.5000 mg | ORAL_TABLET | Freq: Three times a day (TID) | ORAL | 0 refills | Status: DC | PRN

## 2016-02-08 NOTE — Telephone Encounter (Signed)
Patient stated that atenolol is on backorder per her mail order pharmacy. They suggested that the patient get a thirty day rx at a local pharmacy and the mail order will put her back on automatic refill when it becomes available again.

## 2016-03-26 ENCOUNTER — Ambulatory Visit (INDEPENDENT_AMBULATORY_CARE_PROVIDER_SITE_OTHER): Payer: Medicare Other | Admitting: Family Medicine

## 2016-03-26 ENCOUNTER — Ambulatory Visit (INDEPENDENT_AMBULATORY_CARE_PROVIDER_SITE_OTHER): Payer: Medicare Other

## 2016-03-26 ENCOUNTER — Encounter: Payer: Self-pay | Admitting: Family Medicine

## 2016-03-26 VITALS — BP 110/97 | HR 61 | Temp 98.1°F | Wt 149.0 lb

## 2016-03-26 DIAGNOSIS — R935 Abnormal findings on diagnostic imaging of other abdominal regions, including retroperitoneum: Secondary | ICD-10-CM | POA: Diagnosis not present

## 2016-03-26 DIAGNOSIS — I2510 Atherosclerotic heart disease of native coronary artery without angina pectoris: Secondary | ICD-10-CM

## 2016-03-26 DIAGNOSIS — K551 Chronic vascular disorders of intestine: Secondary | ICD-10-CM

## 2016-03-26 DIAGNOSIS — B9789 Other viral agents as the cause of diseases classified elsewhere: Secondary | ICD-10-CM

## 2016-03-26 DIAGNOSIS — J069 Acute upper respiratory infection, unspecified: Secondary | ICD-10-CM

## 2016-03-26 MED ORDER — CEFDINIR 300 MG PO CAPS: 300.0000 mg | ORAL_CAPSULE | Freq: Two times a day (BID) | ORAL | 0 refills | Status: DC

## 2016-03-26 NOTE — Progress Notes (Signed)
Traci [name] is a 70 y.o. female who presents to [facility]: [place] today for abdominal bloating and sinusitis.  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  Over the last 5 days patient has noted mild intermittent abdominal pain with abdominal bloating with eating. She's been using MiraLAX which does not help much. She notes intermittent constipation and diarrhea. No fevers or chills vomiting or blood in the stool present. Symptoms are not particularly consistent with history of gut ischemia.  Sinus pressure: Patient has a few day history of sinus pressure and congestion. No fevers chills cough present. No treatment tried yet.   Past Medical History:  Diagnosis Date  . Anxiety   . Atrial fibrillation ([place])   . CAD (coronary artery disease)   . Claudication ([place]) 01/06/2008   Lower extremity dopplers - no evidence of arterial insufficiency, normal exam  . Coronary artery disease   . GERD (gastroesophageal reflux disease)   . Hyperlipidemia   . Hypertension 11/30/2010   echo- EF 55%; normal w/ mildly sclerotic aortic valve  . Hypertension 11/05/11   renal dopplers - celiac artery and SMA >50% diameter reduction, R renal artery - mildly elevated velocities 1-59% diameter reduction, L renal artery normal  . Nonspecific ST-T wave electrocardiographic changes 03/26/2011   R/Lmv - EF 74%, normal perfusion all regions, ST depression w/ Lexiscan infusion w/o assoc angina  . Osteopenia   . PAD (peripheral artery disease) ([place])   . Peripheral vascular disease ([place])   . PVD (peripheral vascular disease) ([place]) 11/05/2011   doppler - R/L brachial pressures essentially equal w/o inflow disease; L sublclavian/CCA bypass graft demonstrates patent flow, no evidence of significant stenosis  . Sigmoid diverticulitis    Past  Surgical History:  Procedure Laterality Date  . ABDOMINAL AORTIC ANEURYSM REPAIR N/A 01/27/2014   Procedure: AORTO-SUPERIOR MESENTERIC ARTERY BYPASS GRAFT;  Surgeon: Angelia Mould, MD;  Location: [facility];  Service: Vascular;  Laterality: N/A;  . ABDOMINAL HYSTERECTOMY    . APPENDECTOMY    . CARDIAC CATHETERIZATION  06/03/2008   60% LAD, involving D2, borderline significant by IVUS, medical therapy. CFX, RCA OK.  . CHOLECYSTECTOMY    . EYE SURGERY     retinal surgery  . SPINE SURGERY  Feb. 2011  . [place]   X's  several  . SUBCLAVIAN ARTERY STENT Left 09/20/2008   LSA ISR 7x49mm Cordis Genesis on Opta premount, reduction from 80% to 0%  . subclavian artery stents  01/23/2010   Left carotid to subclavian artery Bypass  . VISCERAL ANGIOGRAM  01/17/2014   Procedure: VISCERAL ANGIOGRAM;  Surgeon: Angelia Mould, MD;  Location: [facility] CATH LAB;  Service: Cardiovascular;;   Social History  Substance Use Topics  . Smoking status: Former Smoker    Types: Cigarettes    Quit date: 09/30/1993  . Smokeless tobacco: Never Used  . Alcohol use No   family history includes Diabetes in her maternal grandmother and mother; Heart attack in her father; Heart disease in her father and paternal grandfather; Hyperlipidemia in her father; Hypertension in her father; Kidney disease in her father.  ROS as above:  Medications: Current Outpatient Prescriptions  Medication Sig Dispense Refill  . aspirin EC 81 MG tablet Take 81 mg by mouth daily.    . ATACAND 16 MG tablet TAKE ONE-HALF (1/2) TABLET EVERY MORNING 45 tablet 3  . atenolol (  TENORMIN) 25 MG tablet Take 1 tablet (25 mg total) by mouth daily. 90 tablet 3  . atorvastatin (LIPITOR) 10 MG tablet TAKE 1 TABLET EVERY EVENING 90 tablet 3  . cholecalciferol (VITAMIN D) 1000 UNITS tablet Take 1,000 Units by mouth daily.    . clopidogrel (PLAVIX) 75 MG tablet TAKE 1 TABLET DAILY 90 tablet 3  . clorazepate (TRANXENE) 7.5 MG tablet  Take 1 tablet (7.5 mg total) by mouth daily as needed. 90 tablet 0  . cyanocobalamin 1000 MCG tablet Take 100 mcg by mouth daily.    Marland Kitchen EPINEPHrine (EPIPEN 2-PAK) 0.3 mg/0.3 mL IJ SOAJ injection Inject 0.3 mLs (0.3 mg total) into the muscle as needed (for allergic reaction). 2 Device 0  . esomeprazole (NEXIUM) 40 MG capsule Take 1 capsule (40 mg total) by mouth daily. Take 1 tab daily 90 capsule 0  . estradiol (ESTRACE) 0.5 MG tablet Take 1 tablet (0.5 mg total) by mouth daily. 90 tablet 1  . furosemide (LASIX) 20 MG tablet TAKE 1 TABLET EVERY OTHER DAY 45 tablet 3  . meclizine (ANTIVERT) 25 MG tablet Take 0.5 tablets (12.5 mg total) by mouth 3 (three) times daily as needed for dizziness or nausea. 90 tablet 0  . Multiple Vitamin (MULTIVITAMIN WITH MINERALS) TABS Take 1 tablet by mouth daily.    . polyethylene glycol (MIRALAX / GLYCOLAX) packet Take 17 g by mouth 2 (two) times daily. Until stooling regularly (Patient taking differently: Take 8 g by mouth once. Until stooling regularly) 30 packet 11  . zolpidem (AMBIEN) 10 MG tablet Take 1 tablet (10 mg total) by mouth at bedtime as needed for sleep. 90 tablet 0  . cefdinir (OMNICEF) 300 MG capsule Take 1 capsule (300 mg total) by mouth 2 (two) times daily. 14 capsule 0   No current facility-administered medications for this visit.    Allergies  Allergen Reactions  . Cortisone Anaphylaxis  . Dilaudid [Hydromorphone Hcl] Nausea And Vomiting    Pt states she will start vomiting immediately for 6 hours  . Iodine Anaphylaxis  . Medrol [Methylprednisolone] Anaphylaxis  . Omnipaque [Iohexol] Anaphylaxis  . Prednisone Anaphylaxis  . Shellfish Allergy Anaphylaxis  . Sulfa Drugs Cross Reactors Anaphylaxis  . Augmentin [Amoxicillin-Pot Clavulanate]     Upset stomach  . Morphine And Related     nausea  . Codeine Rash  . Erythromycin Rash     Exam:  BP (!) 110/97   Pulse 61   Temp 98.1 F (36.7 C) (Oral)   Wt 149 lb (67.6 kg)   LMP  (Exact  Date)   SpO2 97%   BMI 26.39 kg/m  Gen: Well NAD HEENT: EOMI,  MMM Clear nasal discharge. Posterior pharynx with mild cobblestoning. No significant cervical lymphadenopathy present. Lungs: Normal work of breathing. CTABL Heart: RRR no MRG Abd: NABS, Soft. Nondistended, Nontender no masses palpated Exts: Brisk capillary refill, warm and well perfused.   No results found for this or any previous visit (from the past 24 hour(s)). No results found.    Assessment and Plan: 70 y.o. female with  Abdominal bloating: Patient has an obvious concerning history of gut ischemia. Her symptoms today are not particularly consistent with gut ischemia however this should always be kept in mind. Obtain basic workup listed below and recheck later this week or early next week. Discussed warning signs or symptoms.  Sinusitis likely viral. Patient is allergic to many medications therefore our treatment options are limited. Recommend over-the-counter Tylenol and use of prescription  [place] which she has tolerated in the past if not better.   Orders Placed This Encounter  Procedures  . DG Abd 1 View    Order Specific Question:   Reason for exam:    Answer:   Assess stones.    Order Specific Question:   Preferred imaging location?    Answer:   [facility]  . COMPLETE METABOLIC PANEL WITH GFR  . CBC with Differential/Platelet  . Lipase  . Lactic Acid, Plasma    Discussed warning signs or symptoms. Please see discharge instructions. Patient expresses understanding.

## 2016-03-26 NOTE — Patient Instructions (Signed)
Thank you for coming in today. Return in a few days.  Get labs today and try bowel rest.  If your belly pain worsens, or you have high fever, bad vomiting, blood in your stool or black tarry stool go to the Emergency Room.

## 2016-03-27 LAB — COMPLETE METABOLIC PANEL WITH GFR
ALBUMIN: 4 g/dL (ref 3.6–5.1)
ALK PHOS: 57 U/L (ref 33–130)
ALT: 18 U/L (ref 6–29)
AST: 26 U/L (ref 10–35)
BILIRUBIN TOTAL: 0.5 mg/dL (ref 0.2–1.2)
BUN: 12 mg/dL (ref 7–25)
CO2: 29 mmol/L (ref 20–31)
Calcium: 9 mg/dL (ref 8.6–10.4)
Chloride: 104 mmol/L (ref 98–110)
Creat: 0.87 mg/dL (ref 0.50–0.99)
GFR, Est African American: 79 mL/min (ref >=60)
GFR, Est Non African American: 68 mL/min (ref >=60)
GLUCOSE: 92 mg/dL (ref 65–99)
Potassium: 4.5 mmol/L (ref 3.5–5.3)
SODIUM: 140 mmol/L (ref 135–146)
TOTAL PROTEIN: 6.9 g/dL (ref 6.1–8.1)

## 2016-03-27 LAB — CBC WITH DIFFERENTIAL/PLATELET
BASOS ABS: 56 {cells}/uL (ref 0–200)
BASOS PCT: 1 %
EOS ABS: 448 {cells}/uL (ref 15–500)
Eosinophils Relative: 8 %
HEMATOCRIT: 37.5 % (ref 35.0–45.0)
HEMOGLOBIN: 12.5 g/dL (ref 11.7–15.5)
Lymphocytes Relative: 32 %
Lymphs Abs: 1792 cells/uL (ref 850–3900)
MCH: 29.3 pg (ref 27.0–33.0)
MCHC: 33.3 g/dL (ref 32.0–36.0)
MCV: 87.8 fL (ref 80.0–100.0)
MONO ABS: 392 {cells}/uL (ref 200–950)
MPV: 9.2 fL (ref 7.5–12.5)
Monocytes Relative: 7 %
Neutro Abs: 2912 cells/uL (ref 1500–7800)
Neutrophils Relative %: 52 %
PLATELETS: 316 10*3/uL (ref 140–400)
RBC: 4.27 MIL/uL (ref 3.80–5.10)
RDW: 13.9 % (ref 11.0–15.0)
WBC: 5.6 10*3/uL (ref 3.8–10.8)

## 2016-03-27 LAB — LIPASE: LIPASE: 25 U/L (ref 7–60)

## 2016-03-28 LAB — LACTIC ACID, PLASMA: LACTIC ACID: 12 mg/dL (ref 4–16)

## 2016-04-01 ENCOUNTER — Encounter: Payer: Self-pay | Admitting: Family Medicine

## 2016-04-01 ENCOUNTER — Ambulatory Visit (INDEPENDENT_AMBULATORY_CARE_PROVIDER_SITE_OTHER): Payer: Medicare Other | Admitting: Family Medicine

## 2016-04-01 VITALS — BP 133/49 | HR 58 | Temp 98.1°F | Wt 147.0 lb

## 2016-04-01 DIAGNOSIS — K551 Chronic vascular disorders of intestine: Secondary | ICD-10-CM

## 2016-04-01 DIAGNOSIS — R197 Diarrhea, unspecified: Secondary | ICD-10-CM

## 2016-04-01 DIAGNOSIS — I739 Peripheral vascular disease, unspecified: Secondary | ICD-10-CM | POA: Diagnosis not present

## 2016-04-01 DIAGNOSIS — R1013 Epigastric pain: Secondary | ICD-10-CM

## 2016-04-01 DIAGNOSIS — I2510 Atherosclerotic heart disease of native coronary artery without angina pectoris: Secondary | ICD-10-CM | POA: Diagnosis not present

## 2016-04-01 DIAGNOSIS — I771 Stricture of artery: Secondary | ICD-10-CM

## 2016-04-01 NOTE — Patient Instructions (Signed)
Thank you for coming in today. Get labs today.  We will arrange MRI ASAP possibly as late as Saturday.  Go to the ER if you worsen.   If your belly pain worsens, or you have high fever, bad vomiting, blood in your stool or black tarry stool go to the Emergency Room.

## 2016-04-01 NOTE — Progress Notes (Signed)
Traci [name] is a 70 y.o. female who presents to [facility]: [place] today for follow up abdominal bloating, pain and diarrhea.   As noted previously the patient has a history of vasculopathy with mesenteric artery bypass. She is not feeling much better but not much worse either. She notes bloating with eating associated with intermittent constipation and diarrhea. She notes mild pain as well. No fevers or chills or vomiting present.   Past Medical History:  Diagnosis Date  . Anxiety   . Atrial fibrillation ([place])   . CAD (coronary artery disease)   . Claudication ([place]) 01/06/2008   Lower extremity dopplers - no evidence of arterial insufficiency, normal exam  . Coronary artery disease   . GERD (gastroesophageal reflux disease)   . Hyperlipidemia   . Hypertension 11/30/2010   echo- EF 55%; normal w/ mildly sclerotic aortic valve  . Hypertension 11/05/11   renal dopplers - celiac artery and SMA >50% diameter reduction, R renal artery - mildly elevated velocities 1-59% diameter reduction, L renal artery normal  . Nonspecific ST-T wave electrocardiographic changes 03/26/2011   R/Lmv - EF 74%, normal perfusion all regions, ST depression w/ Lexiscan infusion w/o assoc angina  . Osteopenia   . PAD (peripheral artery disease) ([place])   . Peripheral vascular disease ([place])   . PVD (peripheral vascular disease) ([place]) 11/05/2011   doppler - R/L brachial pressures essentially equal w/o inflow disease; L sublclavian/CCA bypass graft demonstrates patent flow, no evidence of significant stenosis  . Sigmoid diverticulitis    Past Surgical History:  Procedure Laterality Date  . ABDOMINAL AORTIC ANEURYSM REPAIR N/A 01/27/2014   Procedure: AORTO-SUPERIOR MESENTERIC ARTERY BYPASS GRAFT;  Surgeon: Angelia Mould, MD;  Location: [place];  Service: Vascular;  Laterality: N/A;  .  ABDOMINAL HYSTERECTOMY    . APPENDECTOMY    . CARDIAC CATHETERIZATION  06/03/2008   60% LAD, involving D2, borderline significant by IVUS, medical therapy. CFX, RCA OK.  . CHOLECYSTECTOMY    . EYE SURGERY     retinal surgery  . SPINE SURGERY  Feb. 2011  . [place]   X's  several  . SUBCLAVIAN ARTERY STENT Left 09/20/2008   LSA ISR 7x13mm Cordis Genesis on Opta premount, reduction from 80% to 0%  . subclavian artery stents  01/23/2010   Left carotid to subclavian artery Bypass  . VISCERAL ANGIOGRAM  01/17/2014   Procedure: VISCERAL ANGIOGRAM;  Surgeon: Angelia Mould, MD;  Location: [facility] C[facility] LAB;  Service: Cardiovascular;;   Social History  Substance Use Topics  . Smoking status: Former Smoker    Types: Cigarettes    Quit date: 09/30/1993  . Smokeless tobacco: Never Used  . Alcohol use No   family history includes Diabetes in her maternal grandmother and mother; Heart attack in her father; Heart disease in her father and paternal grandfather; Hyperlipidemia in her father; Hypertension in her father; Kidney disease in her father.  ROS as above:  Medications: Current Outpatient Prescriptions  Medication Sig Dispense Refill  . aspirin EC 81 MG tablet Take 81 mg by mouth daily.    . ATACAND 16 MG tablet TAKE ONE-HALF (1/2) TABLET EVERY MORNING 45 tablet 3  . atenolol (TENORMIN) 25 MG tablet Take 1 tablet (25 mg total) by mouth daily. 90 tablet 3  . atorvastatin (LIPITOR) 10 MG tablet TAKE 1 TABLET EVERY EVENING 90 tablet 3  . cefdinir (OMNICEF) 300 MG capsule  Take 1 capsule (300 mg total) by mouth 2 (two) times daily. 14 capsule 0  . cholecalciferol (VITAMIN D) 1000 UNITS tablet Take 1,000 Units by mouth daily.    . clopidogrel (PLAVIX) 75 MG tablet TAKE 1 TABLET DAILY 90 tablet 3  . clorazepate (TRANXENE) 7.5 MG tablet Take 1 tablet (7.5 mg total) by mouth daily as needed. 90 tablet 0  . cyanocobalamin 1000 MCG tablet Take 100 mcg by mouth daily.    Marland Kitchen  EPINEPHrine (EPIPEN 2-PAK) 0.3 mg/0.3 mL IJ SOAJ injection Inject 0.3 mLs (0.3 mg total) into the muscle as needed (for allergic reaction). 2 Device 0  . esomeprazole (NEXIUM) 40 MG capsule Take 1 capsule (40 mg total) by mouth daily. Take 1 tab daily 90 capsule 0  . estradiol (ESTRACE) 0.5 MG tablet Take 1 tablet (0.5 mg total) by mouth daily. 90 tablet 1  . furosemide (LASIX) 20 MG tablet TAKE 1 TABLET EVERY OTHER DAY 45 tablet 3  . meclizine (ANTIVERT) 25 MG tablet Take 0.5 tablets (12.5 mg total) by mouth 3 (three) times daily as needed for dizziness or nausea. 90 tablet 0  . Multiple Vitamin (MULTIVITAMIN WITH MINERALS) TABS Take 1 tablet by mouth daily.    . polyethylene glycol (MIRALAX / GLYCOLAX) packet Take 17 g by mouth 2 (two) times daily. Until stooling regularly (Patient taking differently: Take 8 g by mouth once. Until stooling regularly) 30 packet 11  . zolpidem (AMBIEN) 10 MG tablet Take 1 tablet (10 mg total) by mouth at bedtime as needed for sleep. 90 tablet 0   No current facility-administered medications for this visit.    Allergies  Allergen Reactions  . Cortisone Anaphylaxis  . Dilaudid [Hydromorphone Hcl] Nausea And Vomiting    Pt states she will start vomiting immediately for 6 hours  . Iodine Anaphylaxis  . Medrol [Methylprednisolone] Anaphylaxis  . Omnipaque [Iohexol] Anaphylaxis  . Prednisone Anaphylaxis  . Shellfish Allergy Anaphylaxis  . Sulfa Drugs Cross Reactors Anaphylaxis  . Augmentin [Amoxicillin-Pot Clavulanate]     Upset stomach  . Morphine And Related     nausea  . Codeine Rash  . Erythromycin Rash    Health Maintenance Health Maintenance  Topic Date Due  . MAMMOGRAM  11/21/2017  . TETANUS/TDAP  06/17/2020  . COLONOSCOPY  07/16/2025  . INFLUENZA VACCINE  Completed  . DEXA SCAN  Completed  . ZOSTAVAX  Completed  . Hepatitis C Screening  Completed  . PNA vac Low Risk Adult  Completed     Exam:  BP (!) 133/49   Pulse (!) 58   Temp 98.1  F (36.7 C) (Oral)   Wt 147 lb (66.7 kg)   LMP  (Exact Date)   BMI 26.04 kg/m  Gen: Well NAD Nontoxic appearing HEENT: EOMI,  MMM Lungs: Normal work of breathing. CTABL Heart: RRR no MRG Abd: NABS, Soft. Nondistended, Nontender Exts: Brisk capillary refill, warm and well perfused.   Patient was given 1 L normal saline fluid and felt better   No results found for this or any previous visit (from the past 72 hour(s)). No results found.    Assessment and Plan: 70 y.o. female with abdominal bloating. Based on history very concerning for vasculopathy. Plan to repeat CBC and metabolic panel. Additionally obtain MR and MRA of the abdomen with and without contrast to evaluate the vascular supply to the gut.  She is allergic to iodine contrast. Follow-up after MRI next week.   No orders of the defined  types were placed in this encounter.   Discussed warning signs or symptoms. Please see discharge instructions. Patient expresses understanding.

## 2016-04-01 NOTE — Progress Notes (Signed)
  Subjective:    CC: Poor venous access  HPI: This is a pleasant 70 year old female, I am called for further evaluation and definitive management regarding IV access. Primary treating provider will provide fluid management after IV access gained  Past medical history:  Negative.  See flowsheet/record as well for more information.  Surgical history: Negative.  See flowsheet/record as well for more information.  Family history: Negative.  See flowsheet/record as well for more information.  Social history: Negative.  See flowsheet/record as well for more information.  Allergies, and medications have been entered into the medical record, reviewed, and no changes needed.   Review of Systems: No fevers, chills, night sweats, weight loss, chest pain, or shortness of breath.   Objective:    General: Well Developed, well nourished, and in no acute distress.  Neuro:  Respiratory: Clear to auscultation biAlert and oriented x3, extra-ocular muscles intact, sensation grossly intact. Cranial nerves II through XII are intact, motor, sensory, and coordinative functions are all intact. HEENT: Normocephalic, atraumatic, pupils equal round reactive to light, neck supple, no masses, no lymphadenopathy, thyroid nonpalpable. Oropharynx, nasopharynx, external ear canals are unremarkable. speaking in full sentences.  Abdominal: Soft, nontender, nondistended, positive bowel sounds, no masses, no organomegaly.  MuSkin: Warm and dry, no rashes noted.  Cardiac: Regular rate and rhythm, no murmurs rubs or gallops. laterally. Not using accessory muscles, sculoskeletal: Shoulder, elbow, wrist, hip, knee, ankle stable, and with full range of motion.  22-gauge angiocatheter placed in the right basilic vein  Impression and Recommendations:    1. Dehydration: 22-gauge angiocatheter placed in the right basilic vein. Further fluid management per primary treating provider.  I spent 25 minutes with this patient, greater  than 50% was face-to-face time counseling regarding the above diagnoses

## 2016-04-01 NOTE — Progress Notes (Signed)
  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  Past medical history:  Negative.  See flowsheet/record as well for more information.  Surgical history: Negative.  See flowsheet/record as well for more information.  Family history: Negative.  See flowsheet/record as well for more information.  Social history: Negative.  See flowsheet/record as well for more information.  Allergies, and medications have been entered into the medical record, reviewed, and no changes needed.   Review of Systems: No fevers, chills, night sweats, weight loss, chest pain, or shortness of breath.   Objective:    General: Well Developed, well nourished, and in no acute distress.  Neuro: Alert and oriented x3, extra-ocular muscles intact, sensation grossly intact.  HEENT: Normocephalic, atraumatic, pupils equal round reactive to light, neck supple, no masses, no lymphadenopathy, thyroid nonpalpable.  Skin: Warm and dry, no rashes. Cardiac: Regular rate and rhythm, no murmurs rubs or gallops, no lower extremity edema.  Respiratory: Clear to auscultation bilaterally. Not using accessory muscles, speaking in full sentences.  22-gauge angiocatheter placed in the right basilic vein  Impression and Recommendations:    Dehydration: 22-gauge angiocatheter placed in the right basilic vein, further fluid management per primary treating provider  I spent 25 minutes with this patient, greater than 50% was face-to-face time counseling regarding the above diagnoses

## 2016-04-02 ENCOUNTER — Telehealth: Payer: Self-pay | Admitting: Family Medicine

## 2016-04-02 LAB — COMPLETE METABOLIC PANEL WITH GFR
ALT: 14 U/L (ref 6–29)
AST: 21 U/L (ref 10–35)
Albumin: 3.7 g/dL (ref 3.6–5.1)
Alkaline Phosphatase: 61 U/L (ref 33–130)
BILIRUBIN TOTAL: 0.5 mg/dL (ref 0.2–1.2)
BUN: 10 mg/dL (ref 7–25)
CO2: 25 mmol/L (ref 20–31)
CREATININE: 0.88 mg/dL (ref 0.50–0.99)
Calcium: 8.3 mg/dL — ABNORMAL LOW (ref 8.6–10.4)
Chloride: 104 mmol/L (ref 98–110)
GFR, Est African American: 78 mL/min (ref >=60)
GFR, Est Non African American: 67 mL/min (ref >=60)
GLUCOSE: 94 mg/dL (ref 65–99)
Potassium: 4.7 mmol/L (ref 3.5–5.3)
SODIUM: 139 mmol/L (ref 135–146)
TOTAL PROTEIN: 6.8 g/dL (ref 6.1–8.1)

## 2016-04-02 LAB — CBC WITH DIFFERENTIAL/PLATELET
BASOS PCT: 1 %
Basophils Absolute: 50 cells/uL (ref 0–200)
EOS ABS: 350 {cells}/uL (ref 15–500)
EOS PCT: 7 %
HCT: 36.2 % (ref 35.0–45.0)
HEMOGLOBIN: 11.9 g/dL (ref 11.7–15.5)
LYMPHS ABS: 1700 {cells}/uL (ref 850–3900)
Lymphocytes Relative: 34 %
MCH: 28.7 pg (ref 27.0–33.0)
MCHC: 32.9 g/dL (ref 32.0–36.0)
MCV: 87.2 fL (ref 80.0–100.0)
MONOS PCT: 7 %
MPV: 9.3 fL (ref 7.5–12.5)
Monocytes Absolute: 350 cells/uL (ref 200–950)
NEUTROS ABS: 2550 {cells}/uL (ref 1500–7800)
Neutrophils Relative %: 51 %
PLATELETS: 287 10*3/uL (ref 140–400)
RBC: 4.15 MIL/uL (ref 3.80–5.10)
RDW: 13.8 % (ref 11.0–15.0)
WBC: 5 10*3/uL (ref 3.8–10.8)

## 2016-04-02 NOTE — Telephone Encounter (Signed)
Called and spoke with Pt regarding pre-medication prior to imaging. Pt is allergic to Prednisone, which is part of the pre-medication. She can take just the benadryl portion.   Called and spoke with a CT tech, there are no other recommendations other than just the benadryl. Will route to Provider to see if Pt needs to take more than just 50mg  Benadryl 1 hour prior to imaging.

## 2016-04-02 NOTE — Telephone Encounter (Signed)
Pt advised. Verbalized understanding.

## 2016-04-02 NOTE — Telephone Encounter (Signed)
I think the 50mg  is benadryl is just fine.

## 2016-04-02 NOTE — Telephone Encounter (Signed)
-----   Message from [place] sent at 04/02/2016 10:28 AM EDT ----- Regarding: MR Abdomen Hello Georgina Snell,   Per [facility] Radiology protocol the above patient will need to be pre medicated prior to getting her MRA done as she is allergic to iodine. If you could please prescribed medication before this Thursday Oct. 19th.  Thank you,  Destiny- [facility]

## 2016-04-04 ENCOUNTER — Ambulatory Visit (HOSPITAL_BASED_OUTPATIENT_CLINIC_OR_DEPARTMENT_OTHER)
Admission: RE | Admit: 2016-04-04 | Discharge: 2016-04-04 | Disposition: A | Discharge status: Home / Self Care | Payer: Medicare Other | Source: Ambulatory Visit | Attending: Family Medicine | Admitting: Family Medicine

## 2016-04-04 DIAGNOSIS — K551 Chronic vascular disorders of intestine: Secondary | ICD-10-CM | POA: Diagnosis not present

## 2016-04-04 DIAGNOSIS — R197 Diarrhea, unspecified: Secondary | ICD-10-CM | POA: Diagnosis not present

## 2016-04-04 DIAGNOSIS — I771 Stricture of artery: Secondary | ICD-10-CM | POA: Insufficient documentation

## 2016-04-04 DIAGNOSIS — I739 Peripheral vascular disease, unspecified: Secondary | ICD-10-CM | POA: Diagnosis not present

## 2016-04-04 DIAGNOSIS — Z9049 Acquired absence of other specified parts of digestive tract: Secondary | ICD-10-CM | POA: Insufficient documentation

## 2016-04-04 DIAGNOSIS — R1013 Epigastric pain: Secondary | ICD-10-CM | POA: Diagnosis not present

## 2016-04-04 MED ORDER — GADOBENATE DIMEGLUMINE 529 MG/ML IV SOLN: 15.0000 mL | Freq: Once | INTRAVENOUS | Status: DC | PRN

## 2016-04-08 ENCOUNTER — Ambulatory Visit (INDEPENDENT_AMBULATORY_CARE_PROVIDER_SITE_OTHER): Payer: Medicare Other | Admitting: Cardiovascular Disease

## 2016-04-08 ENCOUNTER — Ambulatory Visit (INDEPENDENT_AMBULATORY_CARE_PROVIDER_SITE_OTHER): Payer: Medicare Other | Admitting: Family Medicine

## 2016-04-08 ENCOUNTER — Encounter: Payer: Self-pay | Admitting: Cardiovascular Disease

## 2016-04-08 VITALS — BP 108/70 | HR 56 | Wt 147.0 lb

## 2016-04-08 VITALS — BP 114/58 | HR 62 | Ht 63.5 in | Wt 147.2 lb

## 2016-04-08 DIAGNOSIS — R109 Unspecified abdominal pain: Secondary | ICD-10-CM | POA: Diagnosis not present

## 2016-04-08 DIAGNOSIS — I2510 Atherosclerotic heart disease of native coronary artery without angina pectoris: Secondary | ICD-10-CM

## 2016-04-08 DIAGNOSIS — E7800 Pure hypercholesterolemia, unspecified: Secondary | ICD-10-CM | POA: Diagnosis not present

## 2016-04-08 DIAGNOSIS — K551 Chronic vascular disorders of intestine: Secondary | ICD-10-CM

## 2016-04-08 DIAGNOSIS — I771 Stricture of artery: Secondary | ICD-10-CM | POA: Diagnosis not present

## 2016-04-08 DIAGNOSIS — R0789 Other chest pain: Secondary | ICD-10-CM | POA: Diagnosis not present

## 2016-04-08 MED ORDER — ISOSORBIDE MONONITRATE ER 30 MG PO TB24: 30.0000 mg | ORAL_TABLET | Freq: Every day | ORAL | 3 refills | Status: DC

## 2016-04-08 MED ORDER — NITROGLYCERIN 0.4 MG SL SUBL: 0.4000 mg | SUBLINGUAL_TABLET | SUBLINGUAL | 3 refills | Status: DC | PRN

## 2016-04-08 NOTE — Progress Notes (Signed)
Patient ID: Traci [name], female   DOB: 02-Apr-1946, 70 y.o.   MRN: [id number]    Cardiology Office Note    Date:  04/08/2016   ID:  Traci [name], DOB 1946/05/14, MRN [id number]  PCP:  Lynne Leader, MD  Cardiologist:   Sanda Klein, MD   Chief Complaint  Patient presents with  . Follow-up    chest discomfort-X1 month, wakes her up out her sleep, bp elevated, possible irregular heartbeat    History of Present Illness:  Traci [name] is a 70 y.o. female with an extensive history of peripheral arterial disease involving the mesenteric circulation and upper extremities.   She describes recurrent brief sharp stabbing pains in her retrosternal epigastric and left breast area that occur every night around 1 AM and wake her from sleep. She often finds that her blood pressure is elevated when she checks it at that time of night. She does not have exertional angina, dyspnea or claudication. Also has palpitations every night when she goes to bed, never during the day. Denies syncope, dizziness or leg edema.  He does have epigastric discomfort after eating, unless her food is extremely bland. She had an abdominal MRA which shows the known chronically occluded SMA. The aortia-SMA bypass was not clearly imaged due to motion artifact, but the distal branches of the SMA were well perfused with contrast. There was also no evidence of renal artery stenosis. As been referred to a gastroenterologist, Dr. Josephina Gip.  Normal ankle brachial indices by ultrasound Dec 2016. Her last carotid and upper extremity arterial duplex was performed in March 2017 and showed a patent carotid subclavian bypass on the left and <40% bilateral carotid plaque. Today there is no difference in the blood pressures in her left and right upper extremities.  Traci [name] has long-standing history of peripheral arterial disease with previous stents and surgical revascularization of the left  subclavian artery for subclavian steal syndrome and presyncope. She eventually underwent a left carotid to subclavian bypass.  She underwent a bypass from the aorta to the chronically totally occluded superficial mesenteric artery due to symptoms of chronic mesenteric ischemia, (Dr. Scot Dock August 2015 6 mm Dacron graft) She had moderate CAD by coronary angiography performed in 2009. Normal left ventricular systolic function and no perfusion abnormalities by echo 2015 and nuclear stress test performed in 2014.   Past Medical History:  Diagnosis Date  . Anxiety   . Atrial fibrillation ([place])   . CAD (coronary artery disease)   . Claudication ([place]) 01/06/2008   Lower extremity dopplers - no evidence of arterial insufficiency, normal exam  . Coronary artery disease   . GERD (gastroesophageal reflux disease)   . Hyperlipidemia   . Hypertension 11/30/2010   echo- EF 55%; normal w/ mildly sclerotic aortic valve  . Hypertension 11/05/11   renal dopplers - celiac artery and SMA >50% diameter reduction, R renal artery - mildly elevated velocities 1-59% diameter reduction, L renal artery normal  . Nonspecific ST-T wave electrocardiographic changes 03/26/2011   R/Lmv - EF 74%, normal perfusion all regions, ST depression w/ Lexiscan infusion w/o assoc angina  . Osteopenia   . PAD (peripheral artery disease) ([place])   . Peripheral vascular disease ([place])   . PVD (peripheral vascular disease) ([place]) 11/05/2011   doppler - R/L brachial pressures essentially equal w/o inflow disease; L sublclavian/CCA bypass graft demonstrates patent flow, no evidence of significant stenosis  . Sigmoid diverticulitis     Past Surgical History:  Procedure Laterality  Date  . ABDOMINAL AORTIC ANEURYSM REPAIR N/A 01/27/2014   Procedure: AORTO-SUPERIOR MESENTERIC ARTERY BYPASS GRAFT;  Surgeon: Angelia Mould, MD;  Location: [facility];  Service: Vascular;  Laterality: N/A;  . ABDOMINAL HYSTERECTOMY    . APPENDECTOMY    .  CARDIAC CATHETERIZATION  06/03/2008   60% LAD, involving D2, borderline significant by IVUS, medical therapy. CFX, RCA OK.  . CHOLECYSTECTOMY    . EYE SURGERY     retinal surgery  . SPINE SURGERY  Feb. 2011  . [place]   X's  several  . SUBCLAVIAN ARTERY STENT Left 09/20/2008   LSA ISR 7x33mm Cordis Genesis on Opta premount, reduction from 80% to 0%  . subclavian artery stents  01/23/2010   Left carotid to subclavian artery Bypass  . VISCERAL ANGIOGRAM  01/17/2014   Procedure: VISCERAL ANGIOGRAM;  Surgeon: Angelia Mould, MD;  Location: [facility] CATH LAB;  Service: Cardiovascular;;    Outpatient Medications Prior to Visit  Medication Sig Dispense Refill  . aspirin EC 81 MG tablet Take 81 mg by mouth daily.    . ATACAND 16 MG tablet TAKE ONE-HALF (1/2) TABLET EVERY MORNING 45 tablet 3  . atenolol (TENORMIN) 25 MG tablet Take 1 tablet (25 mg total) by mouth daily. 90 tablet 3  . atorvastatin (LIPITOR) 10 MG tablet TAKE 1 TABLET EVERY EVENING 90 tablet 3  . cholecalciferol (VITAMIN D) 1000 UNITS tablet Take 1,000 Units by mouth daily.    . clopidogrel (PLAVIX) 75 MG tablet TAKE 1 TABLET DAILY 90 tablet 3  . clorazepate (TRANXENE) 7.5 MG tablet Take 1 tablet (7.5 mg total) by mouth daily as needed. 90 tablet 0  . cyanocobalamin 1000 MCG tablet Take 100 mcg by mouth daily.    Marland Kitchen EPINEPHrine (EPIPEN 2-PAK) 0.3 mg/0.3 mL IJ SOAJ injection Inject 0.3 mLs (0.3 mg total) into the muscle as needed (for allergic reaction). 2 Device 0  . esomeprazole (NEXIUM) 40 MG capsule Take 1 capsule (40 mg total) by mouth daily. Take 1 tab daily 90 capsule 0  . estradiol (ESTRACE) 0.5 MG tablet Take 1 tablet (0.5 mg total) by mouth daily. 90 tablet 1  . furosemide (LASIX) 20 MG tablet TAKE 1 TABLET EVERY OTHER DAY 45 tablet 3  . meclizine (ANTIVERT) 25 MG tablet Take 0.5 tablets (12.5 mg total) by mouth 3 (three) times daily as needed for dizziness or nausea. 90 tablet 0  . Multiple Vitamin  (MULTIVITAMIN WITH MINERALS) TABS Take 1 tablet by mouth daily.    . polyethylene glycol (MIRALAX / GLYCOLAX) packet Take 17 g by mouth 2 (two) times daily. Until stooling regularly (Patient taking differently: Take 8 g by mouth once. Until stooling regularly) 30 packet 11  . zolpidem (AMBIEN) 10 MG tablet Take 1 tablet (10 mg total) by mouth at bedtime as needed for sleep. 90 tablet 0  . cefdinir (OMNICEF) 300 MG capsule Take 1 capsule (300 mg total) by mouth 2 (two) times daily. (Patient not taking: Reported on 04/08/2016) 14 capsule 0   No facility-administered medications prior to visit.      Allergies:   Cortisone; Dilaudid [hydromorphone hcl]; Iodine; Medrol [methylprednisolone]; Omnipaque [iohexol]; Prednisone; Shellfish allergy; Sulfa drugs cross reactors; Augmentin [amoxicillin-pot clavulanate]; Morphine and related; Codeine; and Erythromycin   Social History   Social History  . Marital status: Legally Separated    Spouse name: N/A  . Number of children: N/A  . Years of education: N/A   Social History Main  Topics  . Smoking status: Former Smoker    Types: Cigarettes    Quit date: 09/30/1993  . Smokeless tobacco: Never Used  . Alcohol use No  . Drug use: No  . Sexual activity: Not Asked   Other Topics Concern  . None   Social History Narrative  . None     Family History:  The patient's family history includes Diabetes in her maternal grandmother and mother; Heart attack in her father; Heart disease in her father and paternal grandfather; Hyperlipidemia in her father; Hypertension in her father; Kidney disease in her father.   ROS:   Please see the history of present illness.    ROS All other systems reviewed and are negative.   PHYSICAL EXAM:   VS:  BP (!) 114/58 (BP Location: Left Arm, Patient Position: Sitting, Cuff Size: Normal)   Pulse 62   Ht 5' 3.5" (1.613 m)   Wt 147 lb 4 oz (66.8 kg)   LMP  (Exact Date)   BMI 25.68 kg/m    Equal round left upper  extremity blood pressures GEN: Well nourished, well developed, in no acute distress  HEENT: normal  Neck: no JVD, faint bilateral carotid bruits (R>L), no carotid delay or masses; scar of previous carotid-subclavian bypass surgery. She has bilateral subclavian bruits (L>R). Cardiac: RRR; no murmurs, rubs, or gallops,no edema  Respiratory:  clear to auscultation bilaterally, normal work of breathing GI: soft, nontender, nondistended, + BS MS: no deformity or atrophy  Skin: warm and dry, no rash Neuro:  Alert and Oriented x 3, Strength and sensation are intact Psych: euthymic mood, full affect  Wt Readings from Last 3 Encounters:  04/08/16 147 lb 4 oz (66.8 kg)  04/08/16 147 lb (66.7 kg)  04/01/16 147 lb (66.7 kg)      Studies/Labs Reviewed:   EKG:  EKG is ordered today.  The ekg  demonstrates normal sinus rhythm, No significant repolarization abnormalities, QTC 442 ms   Recent Labs: 04/01/2016: ALT 14; BUN 10; Creat 0.88; Hemoglobin 11.9; Platelets 287; Potassium 4.7; Sodium 139   Lipid Panel    Component Value Date/Time   CHOL 126 02/07/2016 0822   CHOL 141 04/07/2013 0857   TRIG 130 02/07/2016 0822   HDL 46 02/07/2016 0822   HDL 47 04/07/2013 0857   CHOLHDL 2.7 02/07/2016 0822   VLDL 26 02/07/2016 0822   LDLCALC 54 02/07/2016 0822   LDLCALC 67 04/07/2013 0857    ASSESSMENT:    1. Other chest pain   2. Coronary artery disease, non-occlusive, last cath 2009   3. SMA stenosis ([facility])   4. Subclavian artery stenosis, left ([place])   5. Pure hypercholesterolemia      PLAN:  In order of problems listed above:  1. CAD: Her symptoms are atypical but could represent coronary vasospasm as a cause of angina pectoris. She had a 50-60% LAD lesion at the time of previous coronary angiography. No perfusion abnormalities on nuclear imaging 2014. I suspect her symptoms are not angina pectoris but will try empirical treatment with long-acting nitrates at bedtime and I gave her  prescription for sublingual nitroglycerin to take as needed. We'll also repeat her nuclear perfusion imaging study. 2. SMA stenosis: Status post aorto mesenteric bypass, no clear postprandial intestinal angina. Will be seeing a gastroenterologist. 3. L SCL artery occlusion: Status post carotid subclavian bypass widely patent by recent ultrasound, no claudication/steal syndrome. 4. HLP: Excellent lipid profile on current regimen with LDL less than 70.  Medication Adjustments/Labs and Tests Ordered: Current medicines are reviewed at length with the patient today.  Concerns regarding medicines are outlined above.  Medication changes, Labs and Tests ordered today are listed in the Patient Instructions below. Patient Instructions  Medication Instructions: Dr Sallyanne Kuster has recommended making the following medication changes: 1. START Isosorbide Mononitrate 30 mg - take 1 tablet by mouth at bedtime 2. TAKE Nitroglycerin 0.4 mg - information is included below  Labwork: NONE ORDERED  Testing/Procedures: 1. [place] Stress test - Your physician has requested that you have a lexiscan myoview. For further information please visit [email or web address]. Please follow instruction sheet, as given.  Follow-up: Dr Sallyanne Kuster recommends that you schedule a follow-up appointment in AUGUST 2018. You will receive a reminder letter in the mail two months in advance. If you don't receive a letter, please call our office to schedule the follow-up appointment.  If you need a refill on your cardiac medications before your next appointment, please call your pharmacy.  Nitroglycerin sublingual tablets What is this medicine? NITROGLYCERIN (nye troe GLI ser in) is a type of vasodilator. It relaxes blood vessels, increasing the blood and oxygen supply to your heart. This medicine is used to relieve chest pain caused by angina. It is also used to prevent chest pain before activities like climbing stairs, going  outdoors in cold weather, or sexual activity. This medicine may be used for other purposes; ask your health care provider or pharmacist if you have questions. What should I tell my health care provider before I take this medicine? They need to know if you have any of these conditions: -anemia -head injury, recent stroke, or bleeding in the brain -liver disease -previous heart attack -an unusual or allergic reaction to nitroglycerin, other medicines, foods, dyes, or preservatives -pregnant or trying to get pregnant -breast-feeding How should I use this medicine? Take this medicine by mouth as needed. At the first sign of an angina attack (chest pain or tightness) place one tablet under your tongue. You can also take this medicine 5 to 10 minutes before an event likely to produce chest pain. Follow the directions on the prescription label. Let the tablet dissolve under the tongue. Do not swallow whole. Replace the dose if you accidentally swallow it. It will help if your mouth is not dry. Saliva around the tablet will help it to dissolve more quickly. Do not eat or drink, smoke or chew tobacco while a tablet is dissolving. If you are not better within 5 minutes after taking ONE dose of nitroglycerin, call 9-1-1 immediately to seek emergency medical care. Do not take more than 3 nitroglycerin tablets over 15 minutes. If you take this medicine often to relieve symptoms of angina, your doctor or health care professional may provide you with different instructions to manage your symptoms. If symptoms do not go away after following these instructions, it is important to call 9-1-1 immediately. Do not take more than 3 nitroglycerin tablets over 15 minutes. Talk to your pediatrician regarding the use of this medicine in children. Special care may be needed. Overdosage: If you think you have taken too much of this medicine contact a poison control center or emergency room at once. NOTE: This medicine is only  for you. Do not share this medicine with others. What if I miss a dose? This does not apply. This medicine is only used as needed. What may interact with this medicine? Do not take this medicine with any of the following medications: -certain migraine medicines  like ergotamine and dihydroergotamine (DHE) -medicines used to treat erectile dysfunction like sildenafil, tadalafil, and vardenafil -riociguat This medicine may also interact with the following medications: -alteplase -aspirin -heparin -medicines for high blood pressure -medicines for mental depression -other medicines used to treat angina -phenothiazines like chlorpromazine, mesoridazine, prochlorperazine, thioridazine This list may not describe all possible interactions. Give your health care provider a list of all the medicines, herbs, non-prescription drugs, or dietary supplements you use. Also tell them if you smoke, drink alcohol, or use illegal drugs. Some items may interact with your medicine. What should I watch for while using this medicine? Tell your doctor or health care professional if you feel your medicine is no longer working. Keep this medicine with you at all times. Sit or lie down when you take your medicine to prevent falling if you feel dizzy or faint after using it. Try to remain calm. This will help you to feel better faster. If you feel dizzy, take several deep breaths and lie down with your feet propped up, or bend forward with your head resting between your knees. You may get drowsy or dizzy. Do not drive, use machinery, or do anything that needs mental alertness until you know how this drug affects you. Do not stand or sit up quickly, especially if you are an older patient. This reduces the risk of dizzy or fainting spells. Alcohol can make you more drowsy and dizzy. Avoid alcoholic drinks. Do not treat yourself for coughs, colds, or pain while you are taking this medicine without asking your doctor or health  care professional for advice. Some ingredients may increase your blood pressure. What side effects may I notice from receiving this medicine? Side effects that you should report to your doctor or health care professional as soon as possible: -blurred vision -dry mouth -skin rash -sweating -the feeling of extreme pressure in the head -unusually weak or tired Side effects that usually do not require medical attention (report to your doctor or health care professional if they continue or are bothersome): -flushing of the face or neck -headache -irregular heartbeat, palpitations -nausea, vomiting This list may not describe all possible side effects. Call your doctor for medical advice about side effects. You may report side effects to FDA at 1-800-FDA-1088. Where should I keep my medicine? Keep out of the reach of children. Store at room temperature between 20 and 25 degrees C (68 and 77 degrees F). Store in [employer]. Protect from light and moisture. Keep tightly closed. Throw away any unused medicine after the expiration date. NOTE: This sheet is a summary. It may not cover all possible information. If you have questions about this medicine, talk to your doctor, pharmacist, or health care provider.    2016, [employer] Standard. (2013-04-01 17:57:36)       Signed, Sanda Klein, MD  04/08/2016 5:31 PM    [address], [place], [place]  [address] Phone: [phone]; Fax: [phone]

## 2016-04-08 NOTE — Progress Notes (Signed)
Traci [name] is a 70 y.o. female who presents to [facility] today for follow-up abdominal pain. As noted in previous visits patient has a history of gut ischemia and mesenteric artery bypass. She's been complaining of bloating and abdominal discomfort for a few weeks now. She recently had an MRA of the abdomen showing patent bypass as well as no other significant abnormal findings. The study was somewhat limited due to motion artifact. She notes her symptoms continue but she is able to eat and drink and denies any vomiting. She has an appointment scheduled with her gastroenterologist.   Past Medical History:  Diagnosis Date  . Anxiety   . Atrial fibrillation ([place])   . CAD (coronary artery disease)   . Claudication ([place]) 01/06/2008   Lower extremity dopplers - no evidence of arterial insufficiency, normal exam  . Coronary artery disease   . GERD (gastroesophageal reflux disease)   . Hyperlipidemia   . Hypertension 11/30/2010   echo- EF 55%; normal w/ mildly sclerotic aortic valve  . Hypertension 11/05/11   renal dopplers - celiac artery and SMA >50% diameter reduction, R renal artery - mildly elevated velocities 1-59% diameter reduction, L renal artery normal  . Nonspecific ST-T wave electrocardiographic changes 03/26/2011   R/Lmv - EF 74%, normal perfusion all regions, ST depression w/ Lexiscan infusion w/o assoc angina  . Osteopenia   . PAD (peripheral artery disease) ([place])   . Peripheral vascular disease ([place])   . PVD (peripheral vascular disease) ([place]) 11/05/2011   doppler - R/L brachial pressures essentially equal w/o inflow disease; L sublclavian/CCA bypass graft demonstrates patent flow, no evidence of significant stenosis  . Sigmoid diverticulitis    Past Surgical History:  Procedure Laterality Date  . ABDOMINAL AORTIC ANEURYSM REPAIR N/A 01/27/2014     Procedure: AORTO-SUPERIOR MESENTERIC ARTERY BYPASS GRAFT;  Surgeon: Angelia Mould, MD;  Location: [facility];  Service: Vascular;  Laterality: N/A;  . ABDOMINAL HYSTERECTOMY    . APPENDECTOMY    . CARDIAC CATHETERIZATION  06/03/2008   60% LAD, involving D2, borderline significant by IVUS, medical therapy. CFX, RCA OK.  . CHOLECYSTECTOMY    . EYE SURGERY     retinal surgery  . SPINE SURGERY  Feb. 2011  . [place]   X's  several  . SUBCLAVIAN ARTERY STENT Left 09/20/2008   LSA ISR 7x7mm Cordis Genesis on Opta premount, reduction from 80% to 0%  . subclavian artery stents  01/23/2010   Left carotid to subclavian artery Bypass  . VISCERAL ANGIOGRAM  01/17/2014   Procedure: VISCERAL ANGIOGRAM;  Surgeon: Angelia Mould, MD;  Location: [facility];  Service: Cardiovascular;;   Social History  Substance Use Topics  . Smoking status: Former Smoker    Types: Cigarettes    Quit date: 09/30/1993  . Smokeless tobacco: Never Used  . Alcohol use No   family history includes Diabetes in her maternal grandmother and mother; Heart attack in her father; Heart disease in her father and paternal grandfather; Hyperlipidemia in her father; Hypertension in her father; Kidney disease in her father.  ROS as above:  Medications: Current Outpatient Prescriptions  Medication Sig Dispense Refill  . aspirin EC 81 MG tablet Take 81 mg by mouth daily.    . ATACAND 16 MG tablet TAKE ONE-HALF (1/2) TABLET EVERY MORNING 45 tablet 3  . atenolol (TENORMIN) 25 MG tablet Take 1 tablet (25 mg total)  by mouth daily. 90 tablet 3  . atorvastatin (LIPITOR) 10 MG tablet TAKE 1 TABLET EVERY EVENING 90 tablet 3  . cefdinir (OMNICEF) 300 MG capsule Take 1 capsule (300 mg total) by mouth 2 (two) times daily. 14 capsule 0  . cholecalciferol (VITAMIN D) 1000 UNITS tablet Take 1,000 Units by mouth daily.    . clopidogrel (PLAVIX) 75 MG tablet TAKE 1 TABLET DAILY 90 tablet 3  . clorazepate (TRANXENE)  7.5 MG tablet Take 1 tablet (7.5 mg total) by mouth daily as needed. 90 tablet 0  . cyanocobalamin 1000 MCG tablet Take 100 mcg by mouth daily.    Marland Kitchen EPINEPHrine (EPIPEN 2-PAK) 0.3 mg/0.3 mL IJ SOAJ injection Inject 0.3 mLs (0.3 mg total) into the muscle as needed (for allergic reaction). 2 Device 0  . esomeprazole (NEXIUM) 40 MG capsule Take 1 capsule (40 mg total) by mouth daily. Take 1 tab daily 90 capsule 0  . estradiol (ESTRACE) 0.5 MG tablet Take 1 tablet (0.5 mg total) by mouth daily. 90 tablet 1  . furosemide (LASIX) 20 MG tablet TAKE 1 TABLET EVERY OTHER DAY 45 tablet 3  . meclizine (ANTIVERT) 25 MG tablet Take 0.5 tablets (12.5 mg total) by mouth 3 (three) times daily as needed for dizziness or nausea. 90 tablet 0  . Multiple Vitamin (MULTIVITAMIN WITH MINERALS) TABS Take 1 tablet by mouth daily.    . polyethylene glycol (MIRALAX / GLYCOLAX) packet Take 17 g by mouth 2 (two) times daily. Until stooling regularly (Patient taking differently: Take 8 g by mouth once. Until stooling regularly) 30 packet 11  . zolpidem (AMBIEN) 10 MG tablet Take 1 tablet (10 mg total) by mouth at bedtime as needed for sleep. 90 tablet 0   No current facility-administered medications for this visit.    Allergies  Allergen Reactions  . Cortisone Anaphylaxis  . Dilaudid [Hydromorphone Hcl] Nausea And Vomiting    Pt states she will start vomiting immediately for 6 hours  . Iodine Anaphylaxis  . Medrol [Methylprednisolone] Anaphylaxis  . Omnipaque [Iohexol] Anaphylaxis  . Prednisone Anaphylaxis  . Shellfish Allergy Anaphylaxis  . Sulfa Drugs Cross Reactors Anaphylaxis  . Augmentin [Amoxicillin-Pot Clavulanate]     Upset stomach  . Morphine And Related     nausea  . Codeine Rash  . Erythromycin Rash    Health Maintenance Health Maintenance  Topic Date Due  . MAMMOGRAM  11/21/2017  . TETANUS/TDAP  06/17/2020  . COLONOSCOPY  07/16/2025  . INFLUENZA VACCINE  Completed  . DEXA SCAN  Completed  .  ZOSTAVAX  Completed  . Hepatitis C Screening  Completed  . PNA vac Low Risk Adult  Completed     Exam:  BP 108/70   Pulse (!) 56   Wt 147 lb (66.7 kg)   LMP  (Exact Date)   BMI 26.04 kg/m  Gen: Well NAD Nontoxic appearing HEENT: EOMI,  MMM Lungs: Normal work of breathing. CTABL Heart: RRR no MRG Abd: NABS, Soft. Nondistended, Nontender Exts: Brisk capillary refill, warm and well perfused.    Study Result   CLINICAL DATA:  Chronic mesenteric ischemia. Epigastric pain. History of SMA bypass in 2015. Flare up of abdominal pain with nausea and inability to eat.  EXAM: MRA ABDOMEN WITH OR WITHOUT CONTRAST  TECHNIQUE: Angiographic images of the chest were obtained using MRA technique without and with intravenous contrast.  CONTRAST:  13 mL MultiHance  COMPARISON:  05/15/2015  FINDINGS: Vascular structures: Again noted is an infrarenal aorta-SMA bypass.  The bypass is patent but the image quality is limited due to motion artifact. There is filling of the distal SMA branches. 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 Bilateral renal arteries are patent. Celiac trunk is patent. IMA is patent. Iliac arteries are patent. IVC and portal venous system are patent.  Nonvascular structures: Normal appearance of the liver. Gallbladder has been removed. Common bile duct roughly measures 8 mm and minimally changed. No significant biliary dilatation. Mild dilatation of the pancreatic duct is unchanged. Normal appearance of spleen without enlargement. No gross abnormality to the adrenal glands or kidneys. Negative for hydronephrosis. No gross bowel abnormality. No suspicious bone lesion.  IMPRESSION: SMA bypass graft remains patent. Limited evaluation for graft or anastomotic stenosis due to motion artifact and technical limitations on this examination.  No evidence for renal artery stenosis.  Cholecystectomy.   Electronically Signed   By: Markus Daft M.D.   On: 04/04/2016 11:23      Assessment and Plan: 70 y.o. female with Abdominal pain. Unclear etiology. Recommended follow-up with gastroenterology. Return as needed.   No orders of the defined types were placed in this encounter.   Discussed warning signs or symptoms. Please see discharge instructions. Patient expresses understanding.

## 2016-04-08 NOTE — Patient Instructions (Addendum)
Medication Instructions: Dr Sallyanne Kuster has recommended making the following medication changes: 1. START Isosorbide Mononitrate 30 mg - take 1 tablet by mouth at bedtime 2. TAKE Nitroglycerin 0.4 mg - information is included below  Labwork: NONE ORDERED  Testing/Procedures: 1. [place] Stress test - Your physician has requested that you have a lexiscan myoview. For further information please visit [email or web address]. Please follow instruction sheet, as given.  Follow-up: Dr Sallyanne Kuster recommends that you schedule a follow-up appointment in AUGUST 2018. You will receive a reminder letter in the mail two months in advance. If you don't receive a letter, please call our office to schedule the follow-up appointment.  If you need a refill on your cardiac medications before your next appointment, please call your pharmacy.  Nitroglycerin sublingual tablets What is this medicine? NITROGLYCERIN (nye troe GLI ser in) is a type of vasodilator. It relaxes blood vessels, increasing the blood and oxygen supply to your heart. This medicine is used to relieve chest pain caused by angina. It is also used to prevent chest pain before activities like climbing stairs, going outdoors in cold weather, or sexual activity. This medicine may be used for other purposes; ask your health care provider or pharmacist if you have questions. What should I tell my health care provider before I take this medicine? They need to know if you have any of these conditions: -anemia -head injury, recent stroke, or bleeding in the brain -liver disease -previous heart attack -an unusual or allergic reaction to nitroglycerin, other medicines, foods, dyes, or preservatives -pregnant or trying to get pregnant -breast-feeding How should I use this medicine? Take this medicine by mouth as needed. At the first sign of an angina attack (chest pain or tightness) place one tablet under your tongue. You can also take this medicine  5 to 10 minutes before an event likely to produce chest pain. Follow the directions on the prescription label. Let the tablet dissolve under the tongue. Do not swallow whole. Replace the dose if you accidentally swallow it. It will help if your mouth is not dry. Saliva around the tablet will help it to dissolve more quickly. Do not eat or drink, smoke or chew tobacco while a tablet is dissolving. If you are not better within 5 minutes after taking ONE dose of nitroglycerin, call 9-1-1 immediately to seek emergency medical care. Do not take more than 3 nitroglycerin tablets over 15 minutes. If you take this medicine often to relieve symptoms of angina, your doctor or health care professional may provide you with different instructions to manage your symptoms. If symptoms do not go away after following these instructions, it is important to call 9-1-1 immediately. Do not take more than 3 nitroglycerin tablets over 15 minutes. Talk to your pediatrician regarding the use of this medicine in children. Special care may be needed. Overdosage: If you think you have taken too much of this medicine contact a poison control center or emergency room at once. NOTE: This medicine is only for you. Do not share this medicine with others. What if I miss a dose? This does not apply. This medicine is only used as needed. What may interact with this medicine? Do not take this medicine with any of the following medications: -certain migraine medicines like ergotamine and dihydroergotamine (DHE) -medicines used to treat erectile dysfunction like sildenafil, tadalafil, and vardenafil -riociguat This medicine may also interact with the following medications: -alteplase -aspirin -heparin -medicines for high blood pressure -medicines for mental depression -other medicines used  to treat angina -phenothiazines like chlorpromazine, mesoridazine, prochlorperazine, thioridazine This list may not describe all possible  interactions. Give your health care provider a list of all the medicines, herbs, non-prescription drugs, or dietary supplements you use. Also tell them if you smoke, drink alcohol, or use illegal drugs. Some items may interact with your medicine. What should I watch for while using this medicine? Tell your doctor or health care professional if you feel your medicine is no longer working. Keep this medicine with you at all times. Sit or lie down when you take your medicine to prevent falling if you feel dizzy or faint after using it. Try to remain calm. This will help you to feel better faster. If you feel dizzy, take several deep breaths and lie down with your feet propped up, or bend forward with your head resting between your knees. You may get drowsy or dizzy. Do not drive, use machinery, or do anything that needs mental alertness until you know how this drug affects you. Do not stand or sit up quickly, especially if you are an older patient. This reduces the risk of dizzy or fainting spells. Alcohol can make you more drowsy and dizzy. Avoid alcoholic drinks. Do not treat yourself for coughs, colds, or pain while you are taking this medicine without asking your doctor or health care professional for advice. Some ingredients may increase your blood pressure. What side effects may I notice from receiving this medicine? Side effects that you should report to your doctor or health care professional as soon as possible: -blurred vision -dry mouth -skin rash -sweating -the feeling of extreme pressure in the head -unusually weak or tired Side effects that usually do not require medical attention (report to your doctor or health care professional if they continue or are bothersome): -flushing of the face or neck -headache -irregular heartbeat, palpitations -nausea, vomiting This list may not describe all possible side effects. Call your doctor for medical advice about side effects. You may report side  effects to FDA at 1-800-FDA-1088. Where should I keep my medicine? Keep out of the reach of children. Store at room temperature between 20 and 25 degrees C (68 and 77 degrees F). Store in [employer]. Protect from light and moisture. Keep tightly closed. Throw away any unused medicine after the expiration date. NOTE: This sheet is a summary. It may not cover all possible information. If you have questions about this medicine, talk to your doctor, pharmacist, or health care provider.    2016, [employer]. (2013-04-01 17:57:36)

## 2016-04-08 NOTE — Patient Instructions (Signed)
Thank you for coming in today. Please follow up with your GI.  Return here as needed.  Let me know if you get worse.   If your belly pain worsens, or you have high fever, bad vomiting, blood in your stool or black tarry stool go to the Emergency Room.

## 2016-04-12 ENCOUNTER — Telehealth (HOSPITAL_COMMUNITY): Payer: Self-pay

## 2016-04-12 NOTE — Telephone Encounter (Signed)
Encounter complete. 

## 2016-04-15 DIAGNOSIS — I10 Essential (primary) hypertension: Secondary | ICD-10-CM | POA: Diagnosis not present

## 2016-04-15 DIAGNOSIS — R630 Anorexia: Secondary | ICD-10-CM | POA: Diagnosis not present

## 2016-04-15 DIAGNOSIS — R6881 Early satiety: Secondary | ICD-10-CM | POA: Diagnosis not present

## 2016-04-15 DIAGNOSIS — R140 Abdominal distension (gaseous): Secondary | ICD-10-CM | POA: Diagnosis not present

## 2016-04-16 ENCOUNTER — Ambulatory Visit (HOSPITAL_COMMUNITY)
Admission: RE | Admit: 2016-04-16 | Discharge: 2016-04-16 | Disposition: A | Discharge status: Home / Self Care | Payer: Medicare Other | Source: Ambulatory Visit | Attending: Cardiovascular Disease | Admitting: Cardiovascular Disease

## 2016-04-16 DIAGNOSIS — R55 Syncope and collapse: Secondary | ICD-10-CM | POA: Diagnosis not present

## 2016-04-16 DIAGNOSIS — R002 Palpitations: Secondary | ICD-10-CM | POA: Diagnosis not present

## 2016-04-16 DIAGNOSIS — Z87891 Personal history of nicotine dependence: Secondary | ICD-10-CM | POA: Diagnosis not present

## 2016-04-16 DIAGNOSIS — Z8249 Family history of ischemic heart disease and other diseases of the circulatory system: Secondary | ICD-10-CM | POA: Insufficient documentation

## 2016-04-16 DIAGNOSIS — I4891 Unspecified atrial fibrillation: Secondary | ICD-10-CM | POA: Diagnosis not present

## 2016-04-16 DIAGNOSIS — I714 Abdominal aortic aneurysm, without rupture: Secondary | ICD-10-CM | POA: Insufficient documentation

## 2016-04-16 DIAGNOSIS — Z955 Presence of coronary angioplasty implant and graft: Secondary | ICD-10-CM | POA: Insufficient documentation

## 2016-04-16 DIAGNOSIS — R0789 Other chest pain: Secondary | ICD-10-CM | POA: Diagnosis not present

## 2016-04-16 DIAGNOSIS — G129 Spinal muscular atrophy, unspecified: Secondary | ICD-10-CM | POA: Diagnosis not present

## 2016-04-16 DIAGNOSIS — I739 Peripheral vascular disease, unspecified: Secondary | ICD-10-CM | POA: Diagnosis not present

## 2016-04-16 DIAGNOSIS — I2510 Atherosclerotic heart disease of native coronary artery without angina pectoris: Secondary | ICD-10-CM | POA: Insufficient documentation

## 2016-04-16 DIAGNOSIS — I10 Essential (primary) hypertension: Secondary | ICD-10-CM | POA: Diagnosis not present

## 2016-04-16 LAB — MYOCARDIAL PERFUSION IMAGING
CHL CUP NUCLEAR SSS: 1
CSEPPHR: 94 {beats}/min
LV sys vol: 26 mL
LVDIAVOL: 72 mL (ref 46–106)
Rest HR: 57 {beats}/min
SDS: 1
SRS: 0
TID: 1.07

## 2016-04-16 MED ORDER — REGADENOSON 0.4 MG/5ML IV SOLN
0.4000 mg | Freq: Once | INTRAVENOUS | Status: AC
  Administered 2016-04-16: 0.4 mg via INTRAVENOUS

## 2016-04-16 MED ORDER — TECHNETIUM TC 99M TETROFOSMIN IV KIT
10.6000 | PACK | Freq: Once | INTRAVENOUS | Status: AC | PRN
  Administered 2016-04-16: 10.6 via INTRAVENOUS
  Filled 2016-04-16: qty 11

## 2016-04-16 MED ORDER — TECHNETIUM TC 99M TETROFOSMIN IV KIT
31.5000 | PACK | Freq: Once | INTRAVENOUS | Status: AC | PRN
  Administered 2016-04-16: 31.5 via INTRAVENOUS
  Filled 2016-04-16: qty 32

## 2016-04-16 MED ORDER — AMINOPHYLLINE 25 MG/ML IV SOLN
75.0000 mg | Freq: Once | INTRAVENOUS | Status: AC
  Administered 2016-04-16: 75 mg via INTRAVENOUS

## 2016-04-29 ENCOUNTER — Other Ambulatory Visit: Payer: Self-pay

## 2016-05-01 MED ORDER — CLORAZEPATE DIPOTASSIUM 7.5 MG PO TABS: 7.5000 mg | ORAL_TABLET | Freq: Every day | ORAL | 1 refills | Status: DC | PRN

## 2016-05-01 MED ORDER — MECLIZINE HCL 25 MG PO TABS: 12.5000 mg | ORAL_TABLET | Freq: Three times a day (TID) | ORAL | 1 refills | Status: DC | PRN

## 2016-05-01 MED ORDER — CLORAZEPATE DIPOTASSIUM 7.5 MG PO TABS: 7.5000 mg | ORAL_TABLET | Freq: Every day | ORAL | 0 refills | Status: DC | PRN

## 2016-05-01 MED ORDER — ZOLPIDEM TARTRATE 10 MG PO TABS: 10.0000 mg | ORAL_TABLET | Freq: Every evening | ORAL | 0 refills | Status: DC | PRN

## 2016-05-01 NOTE — Telephone Encounter (Signed)
Pt called requesting a refill of medications previously written by Dr. Ileene Rubens. Pt [facility]. [facility] that [facility] do not currently have tranxene in stock so she would like a 30 day supply to go to the local pharmacy on file. Please refill and print rx if appropriate.

## 2016-05-03 DIAGNOSIS — K581 Irritable bowel syndrome with constipation: Secondary | ICD-10-CM | POA: Diagnosis not present

## 2016-05-03 DIAGNOSIS — I10 Essential (primary) hypertension: Secondary | ICD-10-CM | POA: Diagnosis not present

## 2016-05-13 DIAGNOSIS — I10 Essential (primary) hypertension: Secondary | ICD-10-CM | POA: Diagnosis not present

## 2016-05-13 DIAGNOSIS — S0011XA Contusion of right eyelid and periocular area, initial encounter: Secondary | ICD-10-CM | POA: Diagnosis not present

## 2016-05-24 ENCOUNTER — Other Ambulatory Visit: Payer: Self-pay

## 2016-06-06 ENCOUNTER — Ambulatory Visit (INDEPENDENT_AMBULATORY_CARE_PROVIDER_SITE_OTHER): Payer: Medicare Other

## 2016-06-06 ENCOUNTER — Ambulatory Visit (INDEPENDENT_AMBULATORY_CARE_PROVIDER_SITE_OTHER): Payer: Medicare Other | Admitting: Family Medicine

## 2016-06-06 VITALS — BP 131/40 | HR 57 | Temp 98.1°F | Wt 148.0 lb

## 2016-06-06 DIAGNOSIS — R05 Cough: Secondary | ICD-10-CM | POA: Diagnosis not present

## 2016-06-06 DIAGNOSIS — I2510 Atherosclerotic heart disease of native coronary artery without angina pectoris: Secondary | ICD-10-CM

## 2016-06-06 DIAGNOSIS — M5442 Lumbago with sciatica, left side: Secondary | ICD-10-CM

## 2016-06-06 DIAGNOSIS — I700 Atherosclerosis of aorta: Secondary | ICD-10-CM

## 2016-06-06 DIAGNOSIS — B9789 Other viral agents as the cause of diseases classified elsewhere: Secondary | ICD-10-CM

## 2016-06-06 DIAGNOSIS — J069 Acute upper respiratory infection, unspecified: Secondary | ICD-10-CM | POA: Diagnosis not present

## 2016-06-06 DIAGNOSIS — M545 Low back pain: Secondary | ICD-10-CM | POA: Diagnosis not present

## 2016-06-06 MED ORDER — CEFDINIR 300 MG PO CAPS: 300.0000 mg | ORAL_CAPSULE | Freq: Two times a day (BID) | ORAL | 0 refills | Status: DC

## 2016-06-06 MED ORDER — IPRATROPIUM BROMIDE 0.06 % NA SOLN: 2.0000 | NASAL | 6 refills | Status: DC | PRN

## 2016-06-06 NOTE — Progress Notes (Signed)
Traci [name] is a 70 y.o. female who presents to [facility]: [facility] today for cough congestion and runny nose. This is associated with hoarse voice and mild shortness of breath. Symptoms present for 3-4 days. She's tried some over-the-counter medicines which helped a little. She denies chest pain palpitations or leg swelling. She denies any orthopnea.  Additionally she notes back pain radiating to the left leg. She has a history of chronic low back pain status post lumbar fusion. She notes with the past month or 2 she's had worsening left-sided low back pain radiating to the left lateral calf. Symptoms are worse with forward flexion.    Past Medical History:  Diagnosis Date  . Anxiety   . Atrial fibrillation ([place])   . CAD (coronary artery disease)   . Claudication ([place]) 01/06/2008   Lower extremity dopplers - no evidence of arterial insufficiency, normal exam  . Coronary artery disease   . GERD (gastroesophageal reflux disease)   . Hyperlipidemia   . Hypertension 11/30/2010   echo- EF 55%; normal w/ mildly sclerotic aortic valve  . Hypertension 11/05/11   renal dopplers - celiac artery and SMA >50% diameter reduction, R renal artery - mildly elevated velocities 1-59% diameter reduction, L renal artery normal  . Nonspecific ST-T wave electrocardiographic changes 03/26/2011   R/Lmv - EF 74%, normal perfusion all regions, ST depression w/ Lexiscan infusion w/o assoc angina  . Osteopenia   . PAD (peripheral artery disease) ([place])   . Peripheral vascular disease ([place])   . PVD (peripheral vascular disease) ([place]) 11/05/2011   doppler - R/L brachial pressures essentially equal w/o inflow disease; L sublclavian/CCA bypass graft demonstrates patent flow, no evidence of significant stenosis  . Sigmoid diverticulitis    Past Surgical History:  Procedure Laterality Date  .  ABDOMINAL AORTIC ANEURYSM REPAIR N/A 01/27/2014   Procedure: AORTO-SUPERIOR MESENTERIC ARTERY BYPASS GRAFT;  Surgeon: Angelia Mould, MD;  Location: [facility];  Service: Vascular;  Laterality: N/A;  . ABDOMINAL HYSTERECTOMY    . APPENDECTOMY    . CARDIAC CATHETERIZATION  06/03/2008   60% LAD, involving D2, borderline significant by IVUS, medical therapy. CFX, RCA OK.  . CHOLECYSTECTOMY    . EYE SURGERY     retinal surgery  . SPINE SURGERY  Feb. 2011  . [place]   X's  several  . SUBCLAVIAN ARTERY STENT Left 09/20/2008   LSA ISR 7x37mm Cordis Genesis on Opta premount, reduction from 80% to 0%  . subclavian artery stents  01/23/2010   Left carotid to subclavian artery Bypass  . VISCERAL ANGIOGRAM  01/17/2014   Procedure: VISCERAL ANGIOGRAM;  Surgeon: Angelia Mould, MD;  Location: [facility] CATH LAB;  Service: Cardiovascular;;   Social History  Substance Use Topics  . Smoking status: Former Smoker    Types: Cigarettes    Quit date: 09/30/1993  . Smokeless tobacco: Never Used  . Alcohol use No   family history includes Diabetes in her maternal grandmother and mother; Heart attack in her father; Heart disease in her father and paternal grandfather; Hyperlipidemia in her father; Hypertension in her father; Kidney disease in her father.  ROS as above:  Medications: Current Outpatient Prescriptions  Medication Sig Dispense Refill  . aspirin EC 81 MG tablet Take 81 mg by mouth daily.    . ATACAND 16 MG tablet TAKE ONE-HALF (1/2) TABLET EVERY MORNING 45 tablet 3  . atenolol (TENORMIN) 25  MG tablet Take 1 tablet (25 mg total) by mouth daily. 90 tablet 3  . atorvastatin (LIPITOR) 10 MG tablet TAKE 1 TABLET EVERY EVENING 90 tablet 3  . cefdinir (OMNICEF) 300 MG capsule Take 1 capsule (300 mg total) by mouth 2 (two) times daily. 14 capsule 0  . cholecalciferol (VITAMIN D) 1000 UNITS tablet Take 1,000 Units by mouth daily.    . clopidogrel (PLAVIX) 75 MG tablet TAKE 1 TABLET  DAILY 90 tablet 3  . clorazepate (TRANXENE) 7.5 MG tablet Take 1 tablet (7.5 mg total) by mouth daily as needed. 90 tablet 1  . clorazepate (TRANXENE-T) 7.5 MG tablet Take 1 tablet (7.5 mg total) by mouth daily as needed for anxiety. 30 tablet 0  . cyanocobalamin 1000 MCG tablet Take 100 mcg by mouth daily.    Marland Kitchen EPINEPHrine (EPIPEN 2-PAK) 0.3 mg/0.3 mL IJ SOAJ injection Inject 0.3 mLs (0.3 mg total) into the muscle as needed (for allergic reaction). 2 Device 0  . esomeprazole (NEXIUM) 40 MG capsule Take 1 capsule (40 mg total) by mouth daily. Take 1 tab daily 90 capsule 0  . estradiol (ESTRACE) 0.5 MG tablet Take 1 tablet (0.5 mg total) by mouth daily. 90 tablet 1  . furosemide (LASIX) 20 MG tablet TAKE 1 TABLET EVERY OTHER DAY 45 tablet 3  . ipratropium (ATROVENT) 0.06 % nasal spray Place 2 sprays into both nostrils every 4 (four) hours as needed for rhinitis. 10 mL 6  . isosorbide mononitrate (IMDUR) 30 MG 24 hr tablet Take 1 tablet (30 mg total) by mouth at bedtime. 90 tablet 3  . linaclotide (LINZESS) 145 MCG CAPS capsule Take 1 capsule by mouth daily as needed.    . meclizine (ANTIVERT) 25 MG tablet Take 0.5 tablets (12.5 mg total) by mouth 3 (three) times daily as needed for dizziness or nausea. 90 tablet 1  . Multiple Vitamin (MULTIVITAMIN WITH MINERALS) TABS Take 1 tablet by mouth daily.    . nitroGLYCERIN (NITROSTAT) 0.4 MG SL tablet Place 1 tablet (0.4 mg total) under the tongue every 5 (five) minutes as needed for chest pain. 25 tablet 3  . polyethylene glycol (MIRALAX / GLYCOLAX) packet Take 17 g by mouth 2 (two) times daily. Until stooling regularly (Patient taking differently: Take 8 g by mouth once. Until stooling regularly) 30 packet 11  . zolpidem (AMBIEN) 10 MG tablet Take 1 tablet (10 mg total) by mouth at bedtime as needed for sleep. 90 tablet 0   No current facility-administered medications for this visit.    Allergies  Allergen Reactions  . Cortisone Anaphylaxis  .  Dilaudid [Hydromorphone Hcl] Nausea And Vomiting    Pt states she will start vomiting immediately for 6 hours  . Iodine Anaphylaxis  . Medrol [Methylprednisolone] Anaphylaxis  . Omnipaque [Iohexol] Anaphylaxis  . Prednisone Anaphylaxis  . Shellfish Allergy Anaphylaxis  . Sulfa Drugs Cross Reactors Anaphylaxis  . Augmentin [Amoxicillin-Pot Clavulanate]     Upset stomach  . Morphine And Related     nausea  . Codeine Rash  . Erythromycin Rash    Health Maintenance Health Maintenance  Topic Date Due  . MAMMOGRAM  11/21/2017  . TETANUS/TDAP  06/17/2020  . COLONOSCOPY  07/16/2025  . INFLUENZA VACCINE  Completed  . DEXA SCAN  Completed  . ZOSTAVAX  Completed  . Hepatitis C Screening  Completed  . PNA vac Low Risk Adult  Completed     Exam:  BP (!) 131/40   Pulse (!) 57  Temp 98.1 F (36.7 C)   Wt 148 lb (67.1 kg)   LMP  (Exact Date)   BMI 25.40 kg/m  Gen: Well NAD HEENT: EOMI,  MMM Lungs: Normal work of breathing. CTABL Heart: RRR no MRG Abd: NABS, Soft. Nondistended, Nontender Exts: Brisk capillary refill, warm and well perfused.    No results found for this or any previous visit (from the past 72 hour(s)). Dg Chest 2 View  Result Date: 06/06/2016 CLINICAL DATA:  Cough and chest congestion for several days. History of coronary artery disease, gastroesophageal reflux, former smoker. EXAM: CHEST  2 VIEW COMPARISON:  Chest x-ray of June 22, 2015 FINDINGS: The lungs are well-expanded. The interstitial markings are coarse. There is no alveolar infiltrate or pleural effusion. The heart and pulmonary vascularity are normal. There is calcification in the wall of the aortic arch. The proximal left subclavian artery stent is in stable position. The vascular clips overlying the left apex are stable. The bony thorax exhibits no acute abnormality. IMPRESSION: Mild chronic bronchitic changes, stable. No evidence of pneumonia nor CHF. Thoracic aortic atherosclerosis. Electronically  Signed   By: David  Martinique M.D.   On: 06/06/2016 11:07   Dg Lumbar Spine Complete  Result Date: 06/06/2016 CLINICAL DATA:  Low back pain without known injury. EXAM: LUMBAR SPINE - COMPLETE 4+ VIEW COMPARISON:  AP view of the abdomen of March 26, 2016, and coronal and sagittal images from an abdominal and pelvic CT scan of May 14, 2015 FINDINGS: The lumbar vertebral bodies are preserved in height. The patient has undergone previous fusion procedures at L5-S1. The metallic hardware appears stable. There is no spondylolisthesis. There is no significant facet joint hypertrophy. The disc space heights are well maintained. The pedicles and transverse processes are intact. There is calcification in the wall of the common iliac arteries. IMPRESSION: Postfusion changes centered at L5-S1, stable. No acute lumbar spine abnormality. No significant disc space narrowing. Electronically Signed   By: David  Martinique M.D.   On: 06/06/2016 11:09      Assessment and Plan: 70 y.o. female with viral URI. Plan to treat symptomatically with Atrovent nasal spray. Will use backup Omnicef antibiotics if needed. Chest x-ray obtained.  Lumbago with radiculopathy to the left leg likely L5 nerve distribution. Plan to obtain a are spine x-ray to check on hardware and refer to physical therapy. Patient has a follow-up appointment scheduled with his neurosurgeon January 31. Patient will return to clinic before that appointment if her symptoms persist as we will obtain an MRI head of time.   Orders Placed This Encounter  Procedures  . DG Chest 2 View    Order Specific Question:   Reason for exam:    Answer:   Cough, assess intra-thoracic pathology    Order Specific Question:   Preferred imaging location?    Answer:   [facility]  . DG Lumbar Spine Complete    Standing Status:   Future    Number of Occurrences:   1    Standing Expiration Date:   08/07/2017    Order Specific Question:   Reason for Exam  (SYMPTOM  OR DIAGNOSIS REQUIRED)    Answer:   left L5 radiculopathy and lumbago. Hx fusion    Order Specific Question:   Preferred imaging location?    Answer:   [facility]  . Ambulatory referral to Physical Therapy    Referral Priority:   Routine    Referral Type:   Physical Medicine    Referral  Reason:   Specialty Services Required    Requested Specialty:   Physical Therapy    Number of Visits Requested:   1    Discussed warning signs or symptoms. Please see discharge instructions. Patient expresses understanding.

## 2016-06-06 NOTE — Patient Instructions (Signed)
Thank you for coming in today. Get xrays today.  Use the nasal spray and take antibiotics as needed.  Attend PT  Return in about 1 month.    Upper Respiratory Infection, Adult Most upper respiratory infections (URIs) are caused by a virus. A URI affects the nose, throat, and upper air passages. The most common type of URI is often called "the common cold." Follow these instructions at home:  Take medicines only as told by your doctor.  Gargle warm saltwater or take cough drops to comfort your throat as told by your doctor.  Use a warm mist humidifier or inhale steam from a shower to increase air moisture. This may make it easier to breathe.  Drink enough fluid to keep your pee (urine) clear or pale yellow.  Eat soups and other clear broths.  Have a healthy diet.  Rest as needed.  Go back to work when your fever is gone or your doctor says it is okay.  You may need to stay home longer to avoid giving your URI to others.  You can also wear a face mask and wash your hands often to prevent spread of the virus.  Use your inhaler more if you have asthma.  Do not use any tobacco products, including cigarettes, chewing tobacco, or electronic cigarettes. If you need help quitting, ask your doctor. Contact a doctor if:  You are getting worse, not better.  Your symptoms are not helped by medicine.  You have chills.  You are getting more short of breath.  You have brown or red mucus.  You have yellow or brown discharge from your nose.  You have pain in your face, especially when you bend forward.  You have a fever.  You have puffy (swollen) neck glands.  You have pain while swallowing.  You have white areas in the back of your throat. Get help right away if:  You have very bad or constant:  Headache.  Ear pain.  Pain in your forehead, behind your eyes, and over your cheekbones (sinus pain).  Chest pain.  You have long-lasting (chronic) lung disease and any of  the following:  Wheezing.  Long-lasting cough.  Coughing up blood.  A change in your usual mucus.  You have a stiff neck.  You have changes in your:  Vision.  Hearing.  Thinking.  Mood. This information is not intended to replace advice given to you by your health care provider. Make sure you discuss any questions you have with your health care provider. Document Released: 11/20/2007 Document Revised: 02/04/2016 Document Reviewed: 09/08/2013 Elsevier Interactive Patient Education  2017 [employer].

## 2016-06-24 ENCOUNTER — Encounter: Payer: Self-pay | Admitting: Family Medicine

## 2016-06-24 ENCOUNTER — Ambulatory Visit (INDEPENDENT_AMBULATORY_CARE_PROVIDER_SITE_OTHER): Payer: Medicare Other | Admitting: Family Medicine

## 2016-06-24 ENCOUNTER — Ambulatory Visit (INDEPENDENT_AMBULATORY_CARE_PROVIDER_SITE_OTHER): Payer: Medicare Other

## 2016-06-24 VITALS — BP 106/53 | HR 64 | Wt 150.0 lb

## 2016-06-24 DIAGNOSIS — M4807 Spinal stenosis, lumbosacral region: Secondary | ICD-10-CM | POA: Diagnosis not present

## 2016-06-24 DIAGNOSIS — M5442 Lumbago with sciatica, left side: Secondary | ICD-10-CM

## 2016-06-24 DIAGNOSIS — M5126 Other intervertebral disc displacement, lumbar region: Secondary | ICD-10-CM | POA: Diagnosis not present

## 2016-06-24 DIAGNOSIS — M545 Low back pain: Secondary | ICD-10-CM | POA: Diagnosis not present

## 2016-06-24 NOTE — Progress Notes (Signed)
Traci [name] is a 71 y.o. female who presents to [facility] today for follow up back pain.  She is s/p L5-S1 fusion and has had lower back pain radiating to the outside of her left leg for 3 months. Since she was seen last month, the pain has worsened and also radiates down the outside of her right leg. She also notes pain radiating to the inside of her left thigh/knee recently. Tylenol has not provided sufficient relief. No bladder incontinence or recent bowel changes. Pain not exacerbated by walking/activity. She has not tried PT yet because she thought the plan was to get MRI and then determine need for PT.   Past Medical History:  Diagnosis Date  . Anxiety   . Atrial fibrillation ([place])   . CAD (coronary artery disease)   . Claudication ([place]) 01/06/2008   Lower extremity dopplers - no evidence of arterial insufficiency, normal exam  . Coronary artery disease   . GERD (gastroesophageal reflux disease)   . Hyperlipidemia   . Hypertension 11/30/2010   echo- EF 55%; normal w/ mildly sclerotic aortic valve  . Hypertension 11/05/11   renal dopplers - celiac artery and SMA >50% diameter reduction, R renal artery - mildly elevated velocities 1-59% diameter reduction, L renal artery normal  . Nonspecific ST-T wave electrocardiographic changes 03/26/2011   R/Lmv - EF 74%, normal perfusion all regions, ST depression w/ Lexiscan infusion w/o assoc angina  . Osteopenia   . PAD (peripheral artery disease) ([place])   . Peripheral vascular disease ([place])   . PVD (peripheral vascular disease) ([place]) 11/05/2011   doppler - R/L brachial pressures essentially equal w/o inflow disease; L sublclavian/CCA bypass graft demonstrates patent flow, no evidence of significant stenosis  . Sigmoid diverticulitis    Past Surgical History:  Procedure Laterality Date  . ABDOMINAL AORTIC ANEURYSM REPAIR N/A 01/27/2014   Procedure: AORTO-SUPERIOR MESENTERIC  ARTERY BYPASS GRAFT;  Surgeon: Angelia Mould, MD;  Location: [facility];  Service: Vascular;  Laterality: N/A;  . ABDOMINAL HYSTERECTOMY    . APPENDECTOMY    . CARDIAC CATHETERIZATION  06/03/2008   60% LAD, involving D2, borderline significant by IVUS, medical therapy. CFX, RCA OK.  . CHOLECYSTECTOMY    . EYE SURGERY     retinal surgery  . SPINE SURGERY  Feb. 2011  . [place]   X's  several  . SUBCLAVIAN ARTERY STENT Left 09/20/2008   LSA ISR 7x48mm Cordis Genesis on Opta premount, reduction from 80% to 0%  . subclavian artery stents  01/23/2010   Left carotid to subclavian artery Bypass  . VISCERAL ANGIOGRAM  01/17/2014   Procedure: VISCERAL ANGIOGRAM;  Surgeon: Angelia Mould, MD;  [facility] C[facility];  Service: Cardiovascular;;   Social History  Substance Use Topics  . Smoking status: Former Smoker    Types: Cigarettes    Quit date: 09/30/1993  . Smokeless tobacco: Never Used  . Alcohol use No     ROS:  As above   Medications: Current Outpatient Prescriptions  Medication Sig Dispense Refill  . aspirin EC 81 MG tablet Take 81 mg by mouth daily.    . ATACAND 16 MG tablet TAKE ONE-HALF (1/2) TABLET EVERY MORNING 45 tablet 3  . atenolol (TENORMIN) 25 MG tablet Take 1 tablet (25 mg total) by mouth daily. 90 tablet 3  . atorvastatin (LIPITOR) 10 MG tablet TAKE 1 TABLET EVERY EVENING 90  tablet 3  . cefdinir (OMNICEF) 300 MG capsule Take 1 capsule (300 mg total) by mouth 2 (two) times daily. 14 capsule 0  . cholecalciferol (VITAMIN D) 1000 UNITS tablet Take 1,000 Units by mouth daily.    . clopidogrel (PLAVIX) 75 MG tablet TAKE 1 TABLET DAILY 90 tablet 3  . clorazepate (TRANXENE) 7.5 MG tablet Take 1 tablet (7.5 mg total) by mouth daily as needed. 90 tablet 1  . clorazepate (TRANXENE-T) 7.5 MG tablet Take 1 tablet (7.5 mg total) by mouth daily as needed for anxiety. 30 tablet 0  . cyanocobalamin 1000 MCG tablet Take 100 mcg by mouth daily.    Marland Kitchen  EPINEPHrine (EPIPEN 2-PAK) 0.3 mg/0.3 mL IJ SOAJ injection Inject 0.3 mLs (0.3 mg total) into the muscle as needed (for allergic reaction). 2 Device 0  . esomeprazole (NEXIUM) 40 MG capsule Take 1 capsule (40 mg total) by mouth daily. Take 1 tab daily 90 capsule 0  . estradiol (ESTRACE) 0.5 MG tablet Take 1 tablet (0.5 mg total) by mouth daily. 90 tablet 1  . furosemide (LASIX) 20 MG tablet TAKE 1 TABLET EVERY OTHER DAY 45 tablet 3  . ipratropium (ATROVENT) 0.06 % nasal spray Place 2 sprays into both nostrils every 4 (four) hours as needed for rhinitis. 10 mL 6  . isosorbide mononitrate (IMDUR) 30 MG 24 hr tablet Take 1 tablet (30 mg total) by mouth at bedtime. 90 tablet 3  . linaclotide (LINZESS) 145 MCG CAPS capsule Take 1 capsule by mouth daily as needed.    . meclizine (ANTIVERT) 25 MG tablet Take 0.5 tablets (12.5 mg total) by mouth 3 (three) times daily as needed for dizziness or nausea. 90 tablet 1  . Multiple Vitamin (MULTIVITAMIN WITH MINERALS) TABS Take 1 tablet by mouth daily.    . nitroGLYCERIN (NITROSTAT) 0.4 MG SL tablet Place 1 tablet (0.4 mg total) under the tongue every 5 (five) minutes as needed for chest pain. 25 tablet 3  . polyethylene glycol (MIRALAX / GLYCOLAX) packet Take 17 g by mouth 2 (two) times daily. Until stooling regularly (Patient taking differently: Take 8 g by mouth once. Until stooling regularly) 30 packet 11  . zolpidem (AMBIEN) 10 MG tablet Take 1 tablet (10 mg total) by mouth at bedtime as needed for sleep. 90 tablet 0   No current facility-administered medications for this visit.    Allergies  Allergen Reactions  . Cortisone Anaphylaxis  . Dilaudid [Hydromorphone Hcl] Nausea And Vomiting    Pt states she will start vomiting immediately for 6 hours  . Iodine Anaphylaxis  . Medrol [Methylprednisolone] Anaphylaxis  . Omnipaque [Iohexol] Anaphylaxis  . Prednisone Anaphylaxis  . Shellfish Allergy Anaphylaxis  . Sulfa Drugs Cross Reactors Anaphylaxis  .  Augmentin [Amoxicillin-Pot Clavulanate]     Upset stomach  . Morphine And Related     nausea  . Codeine Rash  . Erythromycin Rash     Exam:  BP (!) 106/53   Pulse 64   Wt 150 lb (68 kg)   LMP  (Exact Date)   BMI 25.75 kg/m  General: Well Developed, well nourished, and in no acute distress.  Neuro/Psych: Alert and oriented x3, extra-ocular muscles intact, able to move all 4 extremities, sensation grossly intact. Skin: Warm and dry, no rashes noted.  Respiratory: Not using accessory muscles, speaking in full sentences, trachea midline.  Cardiovascular: Pulses palpable, no extremity edema. Abdomen: Does not appear distended. MSK: Lower back tender to palpation along midline lumbosacral area. Straight  leg raise test positive on left, negative on right. Pain exacerbated by back extension. No lower extremity weakness noted.  Lumbar spine x-ray 06/06/16: IMPRESSION: Postfusion changes centered at L5-S1, stable. No acute lumbar spine abnormality. No significant disc space narrowing.   No results found for this or any previous visit (from the past 48 hour(s)). No results found.    Assessment and Plan: 71 y.o. female with lumbar radiculopathy in the L5 nerve distribution for 3 months. No acute abnormalities seen on lumbar x-ray. Plan on MRI, start PT, and follow up with her neurosurgeon on January 31. Follow up as needed.   Orders Placed This Encounter  Procedures  . MR LUMBAR SPINE WO CONTRAST    Standing Status:   Future    Standing Expiration Date:   08/24/2017    Order Specific Question:   Does the patient have a pacemaker or implanted devices?    Answer:   No    Order Specific Question:   Preferred imaging location?    Answer[facility]    Order Specific Question:   Reason for exam:    Answer:   lumbar radiculopathy left L3-L5 levels on exam  . Ambulatory referral to Physical Therapy    Referral Priority:   Routine    Referral Type:   Physical Medicine     Referral Reason:   Specialty Services Required    Requested Specialty:   Physical Therapy    Number of Visits Requested:   1    Discussed warning signs or symptoms. Please see discharge instructions. Patient expresses understanding.

## 2016-06-24 NOTE — Patient Instructions (Signed)
Thank you for coming in today. Lets get the MRI soon.  Lets also start PT.  Return as needed.   Come back or go to the emergency room if you notice new weakness new numbness problems walking or bowel or bladder problems.

## 2016-06-25 ENCOUNTER — Ambulatory Visit: Payer: Medicare Other | Admitting: Physical Therapy

## 2016-06-26 ENCOUNTER — Ambulatory Visit (INDEPENDENT_AMBULATORY_CARE_PROVIDER_SITE_OTHER): Payer: Medicare Other | Admitting: Physician Assistant

## 2016-06-26 ENCOUNTER — Other Ambulatory Visit: Payer: Self-pay

## 2016-06-26 VITALS — BP 115/75 | HR 60 | Temp 97.7°F | Wt 148.0 lb

## 2016-06-26 DIAGNOSIS — A09 Infectious gastroenteritis and colitis, unspecified: Secondary | ICD-10-CM | POA: Diagnosis not present

## 2016-06-26 DIAGNOSIS — J069 Acute upper respiratory infection, unspecified: Secondary | ICD-10-CM | POA: Diagnosis not present

## 2016-06-26 DIAGNOSIS — R197 Diarrhea, unspecified: Secondary | ICD-10-CM

## 2016-06-26 MED ORDER — OSELTAMIVIR PHOSPHATE 75 MG PO CAPS: 75.0000 mg | ORAL_CAPSULE | Freq: Two times a day (BID) | ORAL | 0 refills | Status: DC

## 2016-06-26 MED ORDER — ESTRADIOL 0.5 MG PO TABS: 0.5000 mg | ORAL_TABLET | Freq: Every day | ORAL | 1 refills | Status: DC

## 2016-06-26 NOTE — Patient Instructions (Addendum)
Tamiflu twice a day x 5 days Do not take Miralax until diarrhea resolves Push fluids - small sips of clear liquids throughout the day Bowel rest for 24 hours, then introduce a bland diet   Bland Diet Introduction A bland diet consists of foods that do not have a lot of fat or fiber. Foods without fat or fiber are easier for the body to digest. They are also less likely to irritate your mouth, throat, stomach, and other parts of your gastrointestinal tract. A bland diet is sometimes called a BRAT diet. What is my plan? Your health care provider or dietitian may recommend specific changes to your diet to prevent and treat your symptoms, such as:  Eating small meals often.  Cooking food until it is soft enough to chew easily.  Chewing your food well.  Drinking fluids slowly.  Not eating foods that are very spicy, sour, or fatty.  Not eating citrus fruits, such as oranges and grapefruit. What do I need to know about this diet?  Eat a variety of foods from the bland diet food list.  Do not follow a bland diet longer than you have to.  Ask your health care provider whether you should take vitamins. What foods can I eat? Grains  Hot cereals, such as cream of wheat. Bread, crackers, or tortillas made from refined white flour. Rice. Vegetables  Canned or cooked vegetables. Mashed or boiled potatoes. Fruits  Bananas. Applesauce. Other types of cooked or canned fruit with the skin and seeds removed, such as canned peaches or pears. Meats and Other Protein Sources  Scrambled eggs. Creamy peanut butter or other nut butters. Lean, well-cooked meats, such as chicken or fish. Tofu. Soups or broths. Dairy  Low-fat dairy products, such as milk, cottage cheese, or yogurt. Beverages  Water. Herbal tea. Apple juice. Sweets and Desserts  Pudding. Custard. Fruit gelatin. Ice cream. Fats and Oils  Mild salad dressings. Canola or olive oil. The items listed above may not be a complete list of  allowed foods or beverages. Contact your dietitian for more options.  What foods are not recommended? Foods and ingredients that are often not recommended include:  Spicy foods, such as hot sauce or salsa.  Fried foods.  Sour foods, such as pickled or fermented foods.  Raw vegetables or fruits, especially citrus or berries.  Caffeinated drinks.  Alcohol.  Strongly flavored seasonings or condiments. The items listed above may not be a complete list of foods and beverages that are not allowed. Contact your dietitian for more information.  This information is not intended to replace advice given to you by your health care provider. Make sure you discuss any questions you have with your health care provider. Document Released: 09/25/2015 Document Revised: 11/09/2015 Document Reviewed: 06/15/2014  2017 Elsevier

## 2016-06-26 NOTE — Progress Notes (Signed)
HPI:                                                                Traci [name] is a 71 y.o. female who presents to [facility]: [facility] today for flu-like symptoms  Patient with hx of mesenteric ischemia presents with diarrhea x 1 day and upper respiratory symptoms x  2 days.   Diarrhea is watery and foul-smelling. No blood in the stool. No abdominal pain or vomiting. She reports 6 episodes of diarrhea this morning. She recently completed Cefdinir on 06/19/15 for an acute URI. Patient reports a fever of 101 yesterday morning. She has not tried any OTC medications. She did drink some Pedialyte.  Health Maintenance Health Maintenance  Topic Date Due  . MAMMOGRAM  11/21/2017  . TETANUS/TDAP  06/17/2020  . COLONOSCOPY  07/16/2025  . INFLUENZA VACCINE  Completed  . DEXA SCAN  Completed  . ZOSTAVAX  Completed  . Hepatitis C Screening  Completed  . PNA vac Low Risk Adult  Completed    Past Medical History:  Diagnosis Date  . Anxiety   . Atrial fibrillation ([place])   . CAD (coronary artery disease)   . Claudication ([place]) 01/06/2008   Lower extremity dopplers - no evidence of arterial insufficiency, normal exam  . Coronary artery disease   . GERD (gastroesophageal reflux disease)   . Hyperlipidemia   . Hypertension 11/30/2010   echo- EF 55%; normal w/ mildly sclerotic aortic valve  . Hypertension 11/05/11   renal dopplers - celiac artery and SMA >50% diameter reduction, R renal artery - mildly elevated velocities 1-59% diameter reduction, L renal artery normal  . Nonspecific ST-T wave electrocardiographic changes 03/26/2011   R/Lmv - EF 74%, normal perfusion all regions, ST depression w/ Lexiscan infusion w/o assoc angina  . Osteopenia   . PAD (peripheral artery disease) ([place])   . Peripheral vascular disease ([place])   . PVD (peripheral vascular disease) ([place]) 11/05/2011   doppler - R/L brachial pressures essentially equal w/o inflow  disease; L sublclavian/CCA bypass graft demonstrates patent flow, no evidence of significant stenosis  . Sigmoid diverticulitis    Past Surgical History:  Procedure Laterality Date  . ABDOMINAL AORTIC ANEURYSM REPAIR N/A 01/27/2014   Procedure: AORTO-SUPERIOR MESENTERIC ARTERY BYPASS GRAFT;  Surgeon: Angelia Mould, MD;  Location: [facility];  Service: Vascular;  Laterality: N/A;  . ABDOMINAL HYSTERECTOMY    . APPENDECTOMY    . CARDIAC CATHETERIZATION  06/03/2008   60% LAD, involving D2, borderline significant by IVUS, medical therapy. CFX, RCA OK.  . CHOLECYSTECTOMY    . EYE SURGERY     retinal surgery  . SPINE SURGERY  Feb. 2011  . [place]   X's  several  . SUBCLAVIAN ARTERY STENT Left 09/20/2008   LSA ISR 7x39mm Cordis Genesis on Opta premount, reduction from 80% to 0%  . subclavian artery stents  01/23/2010   Left carotid to subclavian artery Bypass  . VISCERAL ANGIOGRAM  01/17/2014   Procedure: VISCERAL ANGIOGRAM;  Surgeon: Angelia Mould, MD;  Location: [facility] CATH LAB;  Service: Cardiovascular;;   Social History  Substance Use Topics  . Smoking status: Former Smoker    Types: Cigarettes  Quit date: 09/30/1993  . Smokeless tobacco: Never Used  . Alcohol use No   family history includes Diabetes in her maternal grandmother and mother; Heart attack in her father; Heart disease in her father and paternal grandfather; Hyperlipidemia in her father; Hypertension in her father; Kidney disease in her father.  Review of Systems  Constitutional: Positive for chills, fever (101 yesterday) and malaise/fatigue.  HENT: Positive for congestion and sore throat.   Respiratory: Negative for cough and shortness of breath.   Cardiovascular: Negative for chest pain, palpitations and leg swelling.  Gastrointestinal: Positive for diarrhea and nausea. Negative for abdominal pain and blood in stool.  Genitourinary: Negative.   Musculoskeletal: Positive for myalgias.  Skin:  Negative for rash.  Neurological: Negative.      Medications: Current Outpatient Prescriptions  Medication Sig Dispense Refill  . aspirin EC 81 MG tablet Take 81 mg by mouth daily.    . ATACAND 16 MG tablet TAKE ONE-HALF (1/2) TABLET EVERY MORNING 45 tablet 3  . atenolol (TENORMIN) 25 MG tablet Take 1 tablet (25 mg total) by mouth daily. 90 tablet 3  . atorvastatin (LIPITOR) 10 MG tablet TAKE 1 TABLET EVERY EVENING 90 tablet 3  . cholecalciferol (VITAMIN D) 1000 UNITS tablet Take 1,000 Units by mouth daily.    . clopidogrel (PLAVIX) 75 MG tablet TAKE 1 TABLET DAILY 90 tablet 3  . clorazepate (TRANXENE) 7.5 MG tablet Take 1 tablet (7.5 mg total) by mouth daily as needed. 90 tablet 1  . clorazepate (TRANXENE-T) 7.5 MG tablet Take 1 tablet (7.5 mg total) by mouth daily as needed for anxiety. 30 tablet 0  . cyanocobalamin 1000 MCG tablet Take 100 mcg by mouth daily.    Marland Kitchen EPINEPHrine (EPIPEN 2-PAK) 0.3 mg/0.3 mL IJ SOAJ injection Inject 0.3 mLs (0.3 mg total) into the muscle as needed (for allergic reaction). 2 Device 0  . esomeprazole (NEXIUM) 40 MG capsule Take 1 capsule (40 mg total) by mouth daily. Take 1 tab daily 90 capsule 0  . estradiol (ESTRACE) 0.5 MG tablet Take 1 tablet (0.5 mg total) by mouth daily. 90 tablet 1  . furosemide (LASIX) 20 MG tablet TAKE 1 TABLET EVERY OTHER DAY 45 tablet 3  . ipratropium (ATROVENT) 0.06 % nasal spray Place 2 sprays into both nostrils every 4 (four) hours as needed for rhinitis. 10 mL 6  . isosorbide mononitrate (IMDUR) 30 MG 24 hr tablet Take 1 tablet (30 mg total) by mouth at bedtime. 90 tablet 3  . linaclotide (LINZESS) 145 MCG CAPS capsule Take 1 capsule by mouth daily as needed.    . meclizine (ANTIVERT) 25 MG tablet Take 0.5 tablets (12.5 mg total) by mouth 3 (three) times daily as needed for dizziness or nausea. 90 tablet 1  . Multiple Vitamin (MULTIVITAMIN WITH MINERALS) TABS Take 1 tablet by mouth daily.    . nitroGLYCERIN (NITROSTAT) 0.4 MG SL  tablet Place 1 tablet (0.4 mg total) under the tongue every 5 (five) minutes as needed for chest pain. 25 tablet 3  . polyethylene glycol (MIRALAX / GLYCOLAX) packet Take 17 g by mouth 2 (two) times daily. Until stooling regularly (Patient taking differently: Take 8 g by mouth once. Until stooling regularly) 30 packet 11  . zolpidem (AMBIEN) 10 MG tablet Take 1 tablet (10 mg total) by mouth at bedtime as needed for sleep. 90 tablet 0   No current facility-administered medications for this visit.    Allergies  Allergen Reactions  . Cortisone Anaphylaxis  .  Dilaudid [Hydromorphone Hcl] Nausea And Vomiting    Pt states she will start vomiting immediately for 6 hours  . Iodine Anaphylaxis  . Medrol [Methylprednisolone] Anaphylaxis  . Omnipaque [Iohexol] Anaphylaxis  . Prednisone Anaphylaxis  . Shellfish Allergy Anaphylaxis  . Sulfa Drugs Cross Reactors Anaphylaxis  . Augmentin [Amoxicillin-Pot Clavulanate]     Upset stomach  . Morphine And Related     nausea  . Codeine Rash  . Erythromycin Rash       Objective:  BP 115/75   Pulse 60   Temp 97.7 F (36.5 C) (Oral)   Wt 148 lb (67.1 kg)   LMP  (Exact Date)   SpO2 96%   BMI 25.40 kg/m  Gen: well-groomed, cooperative, ill-appearing, not toxic appearing, no distress HEENT: normal conjunctiva, TM's clear, oropharynx erythematous, left tonsil enlarged without exudate or abscess, moist mucus membranes Lungs: Normal work of breathing, clear to auscultation bilaterally Heart: Normal rate, regular rhythm, s1 and s2 distinct, no murmurs, clicks or rubs appreciated Abd: Midline longitudinal healed surgical scar. Bowel sounds normal. Soft. Nondistended, Nontender Skin: warm and dry, no rashes or lesions on exposed skin   Assessment and Plan: 71 y.o. female with   Acute upper respiratory infection - high clinical suspicion for influenza. In the window for Tamiflu. Patient states she has tolerated this medication without side effects in  the past - instructed to have household contacts treated - symptomatic management. Tylenol prn for fever and myalgias - oseltamivir (TAMIFLU) 75 MG capsule; Take 1 capsule (75 mg total) by mouth 2 (two) times daily.  Dispense: 10 capsule; Refill: 0  Diarrhea of presumed infectious origin - given patient's hx of mesenteric ischemia, will not give any anti-motility agents. Patient appears hydrated on exam today. - push PO fluids - bowel rest x 24 hours, followed by bland diet - instructed to hold Miralax until she has a normal bowel movement   Patient education and anticipatory guidance given Patient agrees with treatment plan Follow-up if symptoms worsen or fail to improve  Darlyne Russian PA-C

## 2016-06-28 ENCOUNTER — Encounter: Payer: Self-pay | Admitting: Family Medicine

## 2016-06-28 ENCOUNTER — Ambulatory Visit (INDEPENDENT_AMBULATORY_CARE_PROVIDER_SITE_OTHER): Payer: Medicare Other | Admitting: Family Medicine

## 2016-06-28 VITALS — BP 115/44 | HR 62 | Temp 98.1°F

## 2016-06-28 DIAGNOSIS — J069 Acute upper respiratory infection, unspecified: Secondary | ICD-10-CM | POA: Diagnosis not present

## 2016-06-28 DIAGNOSIS — B9789 Other viral agents as the cause of diseases classified elsewhere: Secondary | ICD-10-CM | POA: Diagnosis not present

## 2016-06-28 DIAGNOSIS — H6121 Impacted cerumen, right ear: Secondary | ICD-10-CM

## 2016-06-28 DIAGNOSIS — M5442 Lumbago with sciatica, left side: Secondary | ICD-10-CM

## 2016-06-28 MED ORDER — CEFDINIR 300 MG PO CAPS: 300.0000 mg | ORAL_CAPSULE | Freq: Two times a day (BID) | ORAL | 0 refills | Status: DC

## 2016-06-28 NOTE — Progress Notes (Signed)
Traci [name] is a 71 y.o. female who presents to [facility]: [facility] today for viral illness, cerumen impaction, and follow-up MRI.  Viral illness: Patient was seen on January 10 for presumed flulike illness. She was treated empirically with Tamiflu and notes that this has not helped much. She notes nausea and diarrhea nasal discharge and congestion. She denies fevers or chills or vomiting.  Cerumen impaction: Patient notes pressure and decreased hearing right ear. She thinks she may have a cerumen impaction. She has not tried specific treatment for this problem yet.  Lumbago with lumbar radiculopathy: Patient had MRI in the interval showing bulging disc at L4-L5. This corresponds with the location of her radicular pain. She is scheduled to start physical therapy in the near future. She notes her symptoms have not worsened. She has a follow-up appointment with neurosurgeon at the end of this month.   Past Medical History:  Diagnosis Date  . Anxiety   . Atrial fibrillation ([place])   . CAD (coronary artery disease)   . Claudication ([place]) 01/06/2008   Lower extremity dopplers - no evidence of arterial insufficiency, normal exam  . Coronary artery disease   . GERD (gastroesophageal reflux disease)   . Hyperlipidemia   . Hypertension 11/30/2010   echo- EF 55%; normal w/ mildly sclerotic aortic valve  . Hypertension 11/05/11   renal dopplers - celiac artery and SMA >50% diameter reduction, R renal artery - mildly elevated velocities 1-59% diameter reduction, L renal artery normal  . Nonspecific ST-T wave electrocardiographic changes 03/26/2011   R/Lmv - EF 74%, normal perfusion all regions, ST depression w/ Lexiscan infusion w/o assoc angina  . Osteopenia   . PAD (peripheral artery disease) ([place])   . Peripheral vascular disease ([place])   . PVD (peripheral vascular disease)  ([place]) 11/05/2011   doppler - R/L brachial pressures essentially equal w/o inflow disease; L sublclavian/CCA bypass graft demonstrates patent flow, no evidence of significant stenosis  . Sigmoid diverticulitis    Past Surgical History:  Procedure Laterality Date  . ABDOMINAL AORTIC ANEURYSM REPAIR N/A 01/27/2014   Procedure: AORTO-SUPERIOR MESENTERIC ARTERY BYPASS GRAFT;  Surgeon: Angelia Mould, MD;  Location: [facility];  Service: Vascular;  Laterality: N/A;  . ABDOMINAL HYSTERECTOMY    . APPENDECTOMY    . CARDIAC CATHETERIZATION  06/03/2008   60% LAD, involving D2, borderline significant by IVUS, medical therapy. CFX, RCA OK.  . CHOLECYSTECTOMY    . EYE SURGERY     retinal surgery  . SPINE SURGERY  Feb. 2011  . [place]   X's  several  . SUBCLAVIAN ARTERY STENT Left 09/20/2008   LSA ISR 7x24mm Cordis Genesis on Opta premount, reduction from 80% to 0%  . subclavian artery stents  01/23/2010   Left carotid to subclavian artery Bypass  . VISCERAL ANGIOGRAM  01/17/2014   Procedure: VISCERAL ANGIOGRAM;  Surgeon: Angelia Mould, MD;  Location: [facility] C[facility];  Service: Cardiovascular;;   Social History  Substance Use Topics  . Smoking status: Former Smoker    Types: Cigarettes    Quit date: 09/30/1993  . Smokeless tobacco: Never Used  . Alcohol use No   family history includes Diabetes in her maternal grandmother and mother; Heart attack in her father; Heart disease in her father and paternal grandfather; Hyperlipidemia in her father; Hypertension in her father; Kidney disease in her father.  ROS as above:  Medications: Current Outpatient  Prescriptions  Medication Sig Dispense Refill  . aspirin EC 81 MG tablet Take 81 mg by mouth daily.    . ATACAND 16 MG tablet TAKE ONE-HALF (1/2) TABLET EVERY MORNING 45 tablet 3  . atenolol (TENORMIN) 25 MG tablet Take 1 tablet (25 mg total) by mouth daily. 90 tablet 3  . atorvastatin (LIPITOR) 10 MG tablet TAKE 1 TABLET  EVERY EVENING 90 tablet 3  . cefdinir (OMNICEF) 300 MG capsule Take 1 capsule (300 mg total) by mouth 2 (two) times daily. 14 capsule 0  . cholecalciferol (VITAMIN D) 1000 UNITS tablet Take 1,000 Units by mouth daily.    . clopidogrel (PLAVIX) 75 MG tablet TAKE 1 TABLET DAILY 90 tablet 3  . clorazepate (TRANXENE) 7.5 MG tablet Take 1 tablet (7.5 mg total) by mouth daily as needed. 90 tablet 1  . clorazepate (TRANXENE-T) 7.5 MG tablet Take 1 tablet (7.5 mg total) by mouth daily as needed for anxiety. 30 tablet 0  . cyanocobalamin 1000 MCG tablet Take 100 mcg by mouth daily.    Marland Kitchen EPINEPHrine (EPIPEN 2-PAK) 0.3 mg/0.3 mL IJ SOAJ injection Inject 0.3 mLs (0.3 mg total) into the muscle as needed (for allergic reaction). 2 Device 0  . esomeprazole (NEXIUM) 40 MG capsule Take 1 capsule (40 mg total) by mouth daily. Take 1 tab daily 90 capsule 0  . estradiol (ESTRACE) 0.5 MG tablet Take 1 tablet (0.5 mg total) by mouth daily. 90 tablet 1  . furosemide (LASIX) 20 MG tablet TAKE 1 TABLET EVERY OTHER DAY 45 tablet 3  . ipratropium (ATROVENT) 0.06 % nasal spray Place 2 sprays into both nostrils every 4 (four) hours as needed for rhinitis. 10 mL 6  . isosorbide mononitrate (IMDUR) 30 MG 24 hr tablet Take 1 tablet (30 mg total) by mouth at bedtime. 90 tablet 3  . linaclotide (LINZESS) 145 MCG CAPS capsule Take 1 capsule by mouth daily as needed.    . meclizine (ANTIVERT) 25 MG tablet Take 0.5 tablets (12.5 mg total) by mouth 3 (three) times daily as needed for dizziness or nausea. 90 tablet 1  . Multiple Vitamin (MULTIVITAMIN WITH MINERALS) TABS Take 1 tablet by mouth daily.    . nitroGLYCERIN (NITROSTAT) 0.4 MG SL tablet Place 1 tablet (0.4 mg total) under the tongue every 5 (five) minutes as needed for chest pain. 25 tablet 3  . oseltamivir (TAMIFLU) 75 MG capsule Take 1 capsule (75 mg total) by mouth 2 (two) times daily. 10 capsule 0  . polyethylene glycol (MIRALAX / GLYCOLAX) packet Take 17 g by mouth 2  (two) times daily. Until stooling regularly (Patient taking differently: Take 8 g by mouth once. Until stooling regularly) 30 packet 11  . zolpidem (AMBIEN) 10 MG tablet Take 1 tablet (10 mg total) by mouth at bedtime as needed for sleep. 90 tablet 0   No current facility-administered medications for this visit.    Allergies  Allergen Reactions  . Cortisone Anaphylaxis  . Dilaudid [Hydromorphone Hcl] Nausea And Vomiting    Pt states she will start vomiting immediately for 6 hours  . Iodine Anaphylaxis  . Medrol [Methylprednisolone] Anaphylaxis  . Omnipaque [Iohexol] Anaphylaxis  . Prednisone Anaphylaxis  . Shellfish Allergy Anaphylaxis  . Sulfa Drugs Cross Reactors Anaphylaxis  . Augmentin [Amoxicillin-Pot Clavulanate]     Upset stomach  . Morphine And Related     nausea  . Codeine Rash  . Erythromycin Rash    Health Maintenance Health Maintenance  Topic Date Due  .  MAMMOGRAM  11/21/2017  . TETANUS/TDAP  06/17/2020  . COLONOSCOPY  07/16/2025  . INFLUENZA VACCINE  Completed  . DEXA SCAN  Completed  . ZOSTAVAX  Completed  . Hepatitis C Screening  Completed  . PNA vac Low Risk Adult  Completed     Exam:  BP (!) 115/44   Pulse 62   Temp 98.1 F (36.7 C) (Oral)   LMP  (Exact Date)   SpO2 98%  Gen: Well NAD HEENT: EOMI,  MMM Cerumen impaction right ear. Left ear canal with small amount of dried blood. Normal tympanic membranes. Lungs: Normal work of breathing. CTABL Heart: RRR no MRG Abd: NABS, Soft. Nondistended, Nontender Exts: Brisk capillary refill, warm and well perfused.   Cerumen was irrigated. Patient had improved symptoms and right ear. Right tympanic membrane was normal   No results found for this or any previous visit (from the past 72 hour(s)). Mr Lumbar Spine Wo Contrast  Result Date: 06/24/2016 CLINICAL DATA:  Initial evaluation for low back pain with bilateral leg pain, burning and tingling in left leg ache with weakness. EXAM: MRI LUMBAR SPINE  WITHOUT CONTRAST TECHNIQUE: Multiplanar, multisequence MR imaging of the lumbar spine was performed. No intravenous contrast was administered. COMPARISON:  Prior radiograph from 06/06/2016. FINDINGS: Segmentation: Normal segmentation. Lowest well-formed disc is labeled the L5-S1 level. Alignment: Vertebral bodies normally aligned with preservation of the normal lumbar lordosis. No listhesis. Vertebrae: Patient is status post ALIF at the L5-S1 level. Hardware grossly stable from prior radiograph. Osseous incomplete osseous fusion grossly stable. Vertebral body heights maintained. No evidence for acute or chronic fracture. Signal intensity within the vertebral body bone marrow is normal. No edema about the fusion site. No worrisome osseous lesions. Conus medullaris: Extends to the L1-2 level and appears normal. Paraspinal and other soft tissues: Paraspinous soft tissues within normal limits. Visualized visceral structures unremarkable. Changes related to previous SMA bypass grafting noted, incompletely evaluated on this exam. Disc levels: L1-2:  Negative. L2-3:  Mild annular disc bulge with disc desiccation.  No stenosis. L3-4: Mild annular disc bulge with disc desiccation. No focal disc protrusion. Mild facet arthrosis bilaterally. No significant canal or foraminal stenosis. L4-5: Mild diffuse disc bulge with disc desiccation. No focal disc protrusion. Mild bilateral facet arthrosis with ligamentum flavum thickening. Mild lateral recess stenosis, slightly greater on the left. No significant foraminal narrowing. L5-S1: Status post ALIF. No significant residual disc bulge or disc protrusion. Mild endplate osteophytic spurring. Mild bilateral facet arthrosis. No significant canal stenosis. Mild bilateral foraminal narrowing related to endplate spurring and facet disease. IMPRESSION: 1. Mild disc bulge and facet disease at L4-5 with resultant mild bilateral lateral recess stenosis, slightly greater on the left. 2. Status  post ALIF at [id number] without complication. Mild bilateral foraminal stenosis at this level related to facet disease and endplate osteophytic spurring. 3. Minimal degenerative disc bulging at L2-3 and L3-4 without stenosis or neural impingement. Electronically Signed   By: Jeannine Boga M.D.   On: 06/24/2016 14:10      Assessment and Plan: 72 y.o. female with  Viral  illness with upper respiratory symptoms and gastritis symptoms. Patient seems to be doing well today. Recommend stopping Tamiflu as it does not seem like she has influenza. Use a bit of Imodium as needed sparingly. I have prescribed backup oral antibiotics in case patient develops a bacterial sinus infection which she has not passed. She will avoid taking antibiotics if she does not worsen.  She remained impaction relieved with  irrigation today.  Lumbar radiculopathy likely L4 nerve root on the right side. Recommend physical therapy and follow-up with neurosurgery. Patient is intolerant steroids therefore cannot do epidural steroid injection.  No orders of the defined types were placed in this encounter.   Discussed warning signs or symptoms. Please see discharge instructions. Patient expresses understanding.

## 2016-06-28 NOTE — Patient Instructions (Addendum)
Thank you for coming in today. Use imodium as needed sparingly.  Recheck as needed.  Use the backup antibiotic as needed.   Call or go to the emergency room if you get worse, have trouble breathing, have chest pains, or palpitations.

## 2016-07-02 ENCOUNTER — Encounter: Payer: Self-pay | Admitting: Rehabilitative and Restorative Service Providers"

## 2016-07-02 ENCOUNTER — Ambulatory Visit (INDEPENDENT_AMBULATORY_CARE_PROVIDER_SITE_OTHER): Payer: Medicare Other | Admitting: Rehabilitative and Restorative Service Providers"

## 2016-07-02 DIAGNOSIS — M5416 Radiculopathy, lumbar region: Secondary | ICD-10-CM | POA: Diagnosis not present

## 2016-07-02 DIAGNOSIS — M6281 Muscle weakness (generalized): Secondary | ICD-10-CM | POA: Diagnosis not present

## 2016-07-02 DIAGNOSIS — R293 Abnormal posture: Secondary | ICD-10-CM

## 2016-07-02 DIAGNOSIS — R29898 Other symptoms and signs involving the musculoskeletal system: Secondary | ICD-10-CM | POA: Diagnosis not present

## 2016-07-02 NOTE — Therapy (Signed)
[place], [place], [address] Phone: [phone]   Fax:  [phone]  Physical Therapy Evaluation  Patient Details  Name: [name] MRN: [id number] Date of Birth: 1945/08/12 Referring Provider: Dr Lynne Leader   Encounter Date: 07/02/2016      PT End of Session - 07/02/16 1307    Visit Number 1   Number of Visits 12   Date for PT Re-Evaluation 08/13/16   PT Start Time 1102   PT Stop Time 1200   PT Time Calculation (min) 58 min   Activity Tolerance Patient tolerated treatment well      Past Medical History:  Diagnosis Date  . Anxiety   . Atrial fibrillation ([place])   . CAD (coronary artery disease)   . Claudication ([place]) 01/06/2008   Lower extremity dopplers - no evidence of arterial insufficiency, normal exam  . Coronary artery disease   . GERD (gastroesophageal reflux disease)   . Hyperlipidemia   . Hypertension 11/30/2010   echo- EF 55%; normal w/ mildly sclerotic aortic valve  . Hypertension 11/05/11   renal dopplers - celiac artery and SMA >50% diameter reduction, R renal artery - mildly elevated velocities 1-59% diameter reduction, L renal artery normal  . Nonspecific ST-T wave electrocardiographic changes 03/26/2011   R/Lmv - EF 74%, normal perfusion all regions, ST depression w/ Lexiscan infusion w/o assoc angina  . Osteopenia   . PAD (peripheral artery disease) ([place])   . Peripheral vascular disease ([place])   . PVD (peripheral vascular disease) ([place]) 11/05/2011   doppler - R/L brachial pressures essentially equal w/o inflow disease; L sublclavian/CCA bypass graft demonstrates patent flow, no evidence of significant stenosis  . Sigmoid diverticulitis     Past Surgical History:  Procedure Laterality Date  . ABDOMINAL AORTIC ANEURYSM REPAIR N/A 01/27/2014   Procedure: AORTO-SUPERIOR MESENTERIC ARTERY BYPASS GRAFT;  Surgeon: Angelia Mould, MD;  Location: [facility];   Service: Vascular;  Laterality: N/A;  . ABDOMINAL HYSTERECTOMY    . APPENDECTOMY    . CARDIAC CATHETERIZATION  06/03/2008   60% LAD, involving D2, borderline significant by IVUS, medical therapy. CFX, RCA OK.  . CHOLECYSTECTOMY    . EYE SURGERY     retinal surgery  . SPINE SURGERY  Feb. 2011  . [place]   X's  several  . SUBCLAVIAN ARTERY STENT Left 09/20/2008   LSA ISR 7x39mm Cordis Genesis on Opta premount, reduction from 80% to 0%  . subclavian artery stents  01/23/2010   Left carotid to subclavian artery Bypass  . VISCERAL ANGIOGRAM  01/17/2014   Procedure: VISCERAL ANGIOGRAM;  Surgeon: Angelia Mould, MD;  Location: [facility] CATH LAB;  Service: Cardiovascular;;    There were no vitals filed for this visit.       Subjective Assessment - 07/02/16 1104    Subjective Patient reports that she underwent lumbar surgery 2011 with fusion and rod stabilization at L4/5 for pain and Rt LE radiculopathy. She did well until the past 2-3 months. She has been experiencing pain and radicular symptoms into the Lt LE.    Pertinent History See history in record; subclavial by pass Lt; SMA bypass 2015(messenteric artery bypass)   How long can you sit comfortably? 30-60 min    How long can you stand comfortably? no limit due pain    How long can you walk comfortably? 20-30 min    Diagnostic tests xrays; MRI   Patient Stated Goals  get pain better so she can move more    Currently in Pain? Yes   Pain Score 5    Pain Location Back   Pain Orientation Lower   Pain Descriptors / Indicators Aching   Pain Type Acute pain   Pain Radiating Towards into the LT LE    Pain Onset More than a month ago   Pain Frequency Intermittent   Aggravating Factors  unknown - living life   Pain Relieving Factors heat   Multiple Pain Sites Yes   Pain Score 7   Pain Location Leg   Pain Orientation Left   Pain Descriptors / Indicators Throbbing;Burning;Tingling   Pain Type Acute pain   Pain  Radiating Towards lateral Lt thigh to lower calf and medially to proximal thigh    Pain Onset More than a month ago   Pain Frequency Intermittent   Aggravating Factors  unknown   Pain Relieving Factors time; changing positions             [facility] PT Assessment - 07/02/16 0001      Assessment   Medical Diagnosis Lt lumbar radiculopathy    Referring Provider Dr Lynne Leader    Onset Date/Surgical Date 04/17/16   Hand Dominance Right   Next MD Visit PRN  appt with Dr Erline Levine 07/17/16   Prior Therapy yes - 6 months judst after surgery      Precautions   Precautions None     Balance Screen   Has the patient fallen in the past 6 months No   Has the patient had a decrease in activity level because of a fear of falling?  No   Is the patient reluctant to leave their home because of a fear of falling?  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    Leisure household chores; dancing; travel; was walking twice a day but not in the past few months      Observation/Other Assessments   Focus on Therapeutic Outcomes (FOTO)  48% limitation      Sensation   Additional Comments intermittently tingling lateral Lt thigh      Posture/Postural Control   Posture Comments trunk shifted to the Rt in standing; head forward; shoudlers slightly rounded     AROM   AROM Assessment Site --  pull and discomfort with ext; Rt lat flex; Rt rotation    Lumbar Flexion 65%   Lumbar Extension 40%   Lumbar - Right Side Bend 70%   Lumbar - Left Side Bend 75%   Lumbar - Right Rotation 25%   Lumbar - Left Rotation 20%      Strength   Left Hip Flexion 4+/5   Left Hip Extension 4/5   Left Hip ABduction 4/5   Left Hip ADduction 4-/5   Left Knee Flexion 4+/5   Left Knee Extension 4/5   Left Ankle Plantar Flexion 4+/5  10 heel  lifts with UE support unsteady      Flexibility   Hamstrings tight Rt 75 deg; Lt 70 deg with Lt LE pain posterior thigh     Quadriceps tight Rt 100 deg; Lt 90 deg with pain Lt anterior thigh    ITB tight Lt > Rt    Piriformis tight Lt  Palpation   Spinal mobility pain and limited mobility noted with CPA mobilization lumbar spine    Palpation comment tightness and tenderness noted lumbar paraspinals and QL Lt      Special Tests    Special Tests --  (+) SLR; fabers Lt with radicular pain Lt LE      Balance   Balance Assessed --  SLS ~ 10 sec no UE support more difficult Lt      Functional Gait  Assessment   Gait assessed  --  WNL's no observed limp                   [facility] Adult PT Treatment/Exercise - 07/02/16 0001      Lumbar Exercises: Stretches   Passive Hamstring Stretch 3 reps;30 seconds   Quad Stretch 3 reps;30 seconds  prone with strap    ITB Stretch 2 reps;30 seconds  poor technique     Lumbar Exercises: Supine   Ab Set --  3 part core 10 sec x 10      Moist Heat Therapy   Number Minutes Moist Heat 20 Minutes   Moist Heat Location Lumbar Spine     Electrical Stimulation   Electrical Stimulation Location Rt/Lt L4/5 to Lt lumbar    Electrical Stimulation Action IFC   Electrical Stimulation Parameters to tolerance   Electrical Stimulation Goals Pain;Tone                PT Education - 07/02/16 1141    Education provided Yes   Education Details HEP; TENS    Person(s) Educated Patient   Methods Explanation;Demonstration;Tactile cues;Verbal cues;Handout   Comprehension Verbalized understanding;Returned demonstration;Verbal cues required;Tactile cues required             PT Long Term Goals - 07/02/16 1316      PT LONG TERM GOAL #1   Title Improve posture and alignment with resolution of Rt lateral shift 08/13/16   Time 6   Period Weeks   Status New     PT LONG TERM GOAL #2   Title Increase Lt LE strength to 5-/5 to 5/5 as  neurological symptoms allow 08/13/16   Time 6   Period Weeks   Status New     PT LONG TERM GOAL #3   Title Improve sitting and standing tolerance to 45-60 minutes to increase functional activity level 08/13/16   Time 6   Period Weeks     PT LONG TERM GOAL #4   Title Independent in HEP 08/13/16   Time 6   Period Weeks   Status New     PT LONG TERM GOAL #5   Title Improve FOTO to </= 34% limitation 08/13/16   Time 6   Period Weeks   Status New               Plan - 07/02/16 1308    Clinical Impression Statement Patient presents with ~2-3 month history of LBP and Lt LE radiculopathy including tingling, pain and weakness. She has limited trunk and LE mobility; Lt LE weakness; pain with palpation lumbar spine and lumbar musculature; abnormal posture and alignment; pain on a daily basis; limited functional activities.    Rehab Potential Good   PT Frequency 2x / week   PT Duration 6 weeks   PT Treatment/Interventions Patient/family education;ADLs/Self Care Home Management;Cryotherapy;Electrical Stimulation;Iontophoresis 4mg /ml Dexamethasone;Moist Heat;Traction;Ultrasound;Dry needling;Manual techniques;Therapeutic activities;Therapeutic exercise;Taping;Neuromuscular re-education   PT Next Visit Plan progress with core stabilization; gentle stretching; stablization; manual work; modalities  Consulted and Agree with Plan of Care Patient      Patient will benefit from skilled therapeutic intervention in order to improve the following deficits and impairments:  Postural dysfunction, Improper body mechanics, Pain, Increased muscle spasms, Decreased range of motion, Decreased strength, Decreased activity tolerance  Visit Diagnosis: Lumbar radiculopathy - Plan: PT plan of care cert/re-cert  Muscle weakness (generalized) - Plan: PT plan of care cert/re-cert  Abnormal posture - Plan: PT plan of care cert/re-cert  Other symptoms and signs involving the musculoskeletal system - Plan: PT  plan of care cert/re-cert     Problem List Patient Active Problem List   Diagnosis Date Noted  . Acute bilateral low back pain with left-sided sciatica 06/06/2016  . Osteopenia 12/13/2015  . Menopause 12/08/2015  . History of colonoscopy 07/17/2015  . Irregular heartbeat 07/26/2014  . 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 Preoperative evaluation of a medical condition to rule out surgical contraindications (TAR required) 01/17/2014  . 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[place]) 01/16/2014  . Hyperlipidemia 04/05/2013  . Pre-syncope 12/06/2012  . Hypertension 12/06/2012  . Peripheral arterial disease ([place]) 12/06/2012  . Coronary artery disease, non-occlusive, last cath 2009 12/06/2012  . Hyponatremia, improved holding diuretic 12/06/2012  . Atherosclerosis of other specified arteries 04/02/2012  . 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 Stricture of artery ([place]) 10/02/2011  . Subclavian artery stenosis, left ([place]) 10/02/2011    Nikhil Osei Nilda Simmer PT, MPH  07/02/2016, 1:26 PM  [facility] [place], [place], [address] Phone: [phone]   Fax:  [phone]  Name: Traci [name] MRN: [id number] Date of Birth: Dec 19, 1945

## 2016-07-02 NOTE — Patient Instructions (Signed)
Abdominal Bracing With Pelvic Floor (Hook-Lying)    With neutral spine, tighten pelvic floor and abdominals sucking belly button to back bone; tighten muscles in low back at waist. Hold 10 sec Repeat _10_ times. Do __several _ times a day. Work toward doing this in sitting; standing; walking and with functional activities  HIP: Hamstrings - Supine   Place strap around foot. Raise leg up, keeping knee straight.  Bend opposite knee to protect back if indicated. Hold 30 seconds. 3 reps per set, 2-3 sets per day     Outer Hip Stretch: Reclined IT Band Stretch (Strap)   Strap around one foot, pull leg across body until you feel a pull or stretch, with shoulders on mat. Hold for 30 seconds. Repeat 3 times each leg. 2-3 times/day.   Quads / HF, Prone   Lie face down. Grasp one ankle with same-side hand. Use towel if needed to reach. Gently pull foot toward buttock.  Hold 30 seconds. Repeat 3 times per session. Do 2-3 sessions per day.   TENS UNIT: This is helpful for muscle pain and spasm.   Search and Purchase a TENS 7000 2nd edition at www.tenspros.com. It should be less than $30.     TENS unit instructions: Do not shower or bathe with the unit on Turn the unit off before removing electrodes or batteries If the electrodes lose stickiness add a drop of water to the electrodes after they are disconnected from the unit and place on plastic sheet. If you continued to have difficulty, call the TENS unit company to purchase more electrodes. Do not apply lotion on the skin area prior to use. Make sure the skin is clean and dry as this will help prolong the life of the electrodes. After use, always check skin for unusual red areas, rash or other skin difficulties. If there are any skin problems, does not apply electrodes to the same area. Never remove the electrodes from the unit by pulling the wires. Do not use the TENS unit or electrodes other than as directed. Do not change  electrode placement without consultating your therapist or physician. Keep 2 fingers with between each electrode.

## 2016-07-04 ENCOUNTER — Encounter: Payer: Medicare Other | Admitting: Physical Therapy

## 2016-07-09 ENCOUNTER — Encounter: Payer: Medicare Other | Admitting: Rehabilitative and Restorative Service Providers"

## 2016-07-10 ENCOUNTER — Encounter: Payer: Self-pay | Admitting: Family Medicine

## 2016-07-10 ENCOUNTER — Ambulatory Visit (INDEPENDENT_AMBULATORY_CARE_PROVIDER_SITE_OTHER): Payer: Medicare Other | Admitting: Family Medicine

## 2016-07-10 VITALS — BP 102/56 | HR 66 | Temp 98.1°F | Wt 148.0 lb

## 2016-07-10 DIAGNOSIS — J029 Acute pharyngitis, unspecified: Secondary | ICD-10-CM | POA: Diagnosis not present

## 2016-07-10 MED ORDER — NYSTATIN 100000 UNIT/ML MT SUSP: 500000.0000 [IU] | Freq: Four times a day (QID) | OROMUCOSAL | 1 refills | Status: DC

## 2016-07-10 NOTE — Progress Notes (Signed)
Traci [name] is a 71 y.o. female who presents to [facility] [place] today for sore throat and congestion. Patient has been seen several times now for sore throat and coughing congestion. Her symptoms have been ongoing for about a month now. She's tried multiple different over-the-counter medications and antibiotics that I prescribed at the last visit. Nothing has helped very much. She notes a white plaque in her mouth as well. She denies fevers or chills. She has noted some mild nausea and early satiety as well with the symptoms. She denies frank abdominal pain or chest pain.   Past Medical History:  Diagnosis Date  . Anxiety   . Atrial fibrillation ([place])   . CAD (coronary artery disease)   . Claudication ([place]) 01/06/2008   Lower extremity dopplers - no evidence of arterial insufficiency, normal exam  . Coronary artery disease   . GERD (gastroesophageal reflux disease)   . Hyperlipidemia   . Hypertension 11/30/2010   echo- EF 55%; normal w/ mildly sclerotic aortic valve  . Hypertension 11/05/11   renal dopplers - celiac artery and SMA >50% diameter reduction, R renal artery - mildly elevated velocities 1-59% diameter reduction, L renal artery normal  . Nonspecific ST-T wave electrocardiographic changes 03/26/2011   R/Lmv - EF 74%, normal perfusion all regions, ST depression w/ Lexiscan infusion w/o assoc angina  . Osteopenia   . PAD (peripheral artery disease) ([place])   . Peripheral vascular disease ([place])   . PVD (peripheral vascular disease) ([place]) 11/05/2011   doppler - R/L brachial pressures essentially equal w/o inflow disease; L sublclavian/CCA bypass graft demonstrates patent flow, no evidence of significant stenosis  . Sigmoid diverticulitis    Past Surgical History:  Procedure Laterality Date  . ABDOMINAL AORTIC ANEURYSM REPAIR N/A 01/27/2014   Procedure:  AORTO-SUPERIOR MESENTERIC ARTERY BYPASS GRAFT;  Surgeon: Angelia Mould, MD;  Location: [facility];  Service: Vascular;  Laterality: N/A;  . ABDOMINAL HYSTERECTOMY    . APPENDECTOMY    . CARDIAC CATHETERIZATION  06/03/2008   60% LAD, involving D2, borderline significant by IVUS, medical therapy. CFX, RCA OK.  . CHOLECYSTECTOMY    . EYE SURGERY     retinal surgery  . SPINE SURGERY  Feb. 2011  . [place]   X's  several  . SUBCLAVIAN ARTERY STENT Left 09/20/2008   LSA ISR 7x41mm Cordis Genesis on Opta premount, reduction from 80% to 0%  . subclavian artery stents  01/23/2010   Left carotid to subclavian artery Bypass  . VISCERAL ANGIOGRAM  01/17/2014   Procedure: VISCERAL ANGIOGRAM;  Surgeon: Angelia Mould, MD;  Location: [facility] CATH LAB;  Service: Cardiovascular;;   Social History  Substance Use Topics  . Smoking status: Former Smoker    Types: Cigarettes    Quit date: 09/30/1993  . Smokeless tobacco: Never Used  . Alcohol use No   family history includes Diabetes in her maternal grandmother and mother; Heart attack in her father; Heart disease in her father and paternal grandfather; Hyperlipidemia in her father; Hypertension in her father; Kidney disease in her father.  ROS as above:  Medications: Current Outpatient Prescriptions  Medication Sig Dispense Refill  . aspirin EC 81 MG tablet Take 81 mg by mouth daily.    . ATACAND 16 MG tablet TAKE ONE-HALF (1/2) TABLET EVERY MORNING 45 tablet 3  . atenolol (TENORMIN) 25 MG tablet Take 1 tablet (25 mg total) by mouth  daily. 90 tablet 3  . atorvastatin (LIPITOR) 10 MG tablet TAKE 1 TABLET EVERY EVENING 90 tablet 3  . cefdinir (OMNICEF) 300 MG capsule Take 1 capsule (300 mg total) by mouth 2 (two) times daily. 14 capsule 0  . cholecalciferol (VITAMIN D) 1000 UNITS tablet Take 1,000 Units by mouth daily.    . clopidogrel (PLAVIX) 75 MG tablet TAKE 1 TABLET DAILY 90 tablet 3  . clorazepate (TRANXENE) 7.5 MG tablet  Take 1 tablet (7.5 mg total) by mouth daily as needed. 90 tablet 1  . clorazepate (TRANXENE-T) 7.5 MG tablet Take 1 tablet (7.5 mg total) by mouth daily as needed for anxiety. 30 tablet 0  . cyanocobalamin 1000 MCG tablet Take 100 mcg by mouth daily.    Marland Kitchen EPINEPHrine (EPIPEN 2-PAK) 0.3 mg/0.3 mL IJ SOAJ injection Inject 0.3 mLs (0.3 mg total) into the muscle as needed (for allergic reaction). 2 Device 0  . esomeprazole (NEXIUM) 40 MG capsule Take 1 capsule (40 mg total) by mouth daily. Take 1 tab daily 90 capsule 0  . estradiol (ESTRACE) 0.5 MG tablet Take 1 tablet (0.5 mg total) by mouth daily. 90 tablet 1  . furosemide (LASIX) 20 MG tablet TAKE 1 TABLET EVERY OTHER DAY 45 tablet 3  . ipratropium (ATROVENT) 0.06 % nasal spray Place 2 sprays into both nostrils every 4 (four) hours as needed for rhinitis. 10 mL 6  . linaclotide (LINZESS) 145 MCG CAPS capsule Take 1 capsule by mouth daily as needed.    . meclizine (ANTIVERT) 25 MG tablet Take 0.5 tablets (12.5 mg total) by mouth 3 (three) times daily as needed for dizziness or nausea. 90 tablet 1  . Multiple Vitamin (MULTIVITAMIN WITH MINERALS) TABS Take 1 tablet by mouth daily.    Marland Kitchen oseltamivir (TAMIFLU) 75 MG capsule Take 1 capsule (75 mg total) by mouth 2 (two) times daily. 10 capsule 0  . polyethylene glycol (MIRALAX / GLYCOLAX) packet Take 17 g by mouth 2 (two) times daily. Until stooling regularly (Patient taking differently: Take 8 g by mouth once. Until stooling regularly) 30 packet 11  . zolpidem (AMBIEN) 10 MG tablet Take 1 tablet (10 mg total) by mouth at bedtime as needed for sleep. 90 tablet 0  . isosorbide mononitrate (IMDUR) 30 MG 24 hr tablet Take 1 tablet (30 mg total) by mouth at bedtime. 90 tablet 3  . nitroGLYCERIN (NITROSTAT) 0.4 MG SL tablet Place 1 tablet (0.4 mg total) under the tongue every 5 (five) minutes as needed for chest pain. 25 tablet 3  . nystatin (MYCOSTATIN) 100000 UNIT/ML suspension Take 5 mLs (500,000 Units total)  by mouth 4 (four) times daily. Swish for 30 seconds and swallow. 14 days 180 mL 1   No current facility-administered medications for this visit.    Allergies  Allergen Reactions  . Cortisone Anaphylaxis  . Dilaudid [Hydromorphone Hcl] Nausea And Vomiting    Pt states she will start vomiting immediately for 6 hours  . Iodine Anaphylaxis  . Medrol [Methylprednisolone] Anaphylaxis  . Omnipaque [Iohexol] Anaphylaxis  . Prednisone Anaphylaxis  . Shellfish Allergy Anaphylaxis  . Sulfa Drugs Cross Reactors Anaphylaxis  . Augmentin [Amoxicillin-Pot Clavulanate]     Upset stomach  . Morphine And Related     nausea  . Codeine Rash  . Erythromycin Rash    Health Maintenance Health Maintenance  Topic Date Due  . MAMMOGRAM  11/21/2017  . TETANUS/TDAP  06/17/2020  . COLONOSCOPY  07/16/2025  . INFLUENZA VACCINE  Completed  .  DEXA SCAN  Completed  . ZOSTAVAX  Completed  . Hepatitis C Screening  Completed  . PNA vac Low Risk Adult  Completed     Exam:  BP (!) 102/56   Pulse 66   Temp 98.1 F (36.7 C) (Oral)   Wt 148 lb (67.1 kg)   BMI 25.40 kg/m  Gen: Well NAD HEENT: EOMI,  MMM Oral pharynx with small amount of white material. No erythema. No cervical lymphadenopathy. Lungs: Normal work of breathing. CTABL Heart: RRR no MRG Abd: NABS, Soft. Nondistended, Nontender Exts: Brisk capillary refill, warm and well perfused.    No results found for this or any previous visit (from the past 72 hour(s)). No results found.    Assessment and Plan: 71 y.o. female with pharyngitis or esophagitis. Unclear etiology. This is likely simple recurrent viral illness however she's a bit immunocompromised and she has this new oral white material after antibiotics and maybe oral candidiasis. Will treat with nystatin swish and swallow for possible esophageal candidiasis for 10 days to 14 days. Additionally if not better or recommend patient follow-up with her gastroenterologist.  The diagnosis is  somewhat uncertain. No orders of the defined types were placed in this encounter.   Discussed warning signs or symptoms. Please see discharge instructions. Patient expresses understanding.

## 2016-07-10 NOTE — Patient Instructions (Signed)
Thank you for coming in today. Use nystatin swish and swallow 4 x daily for 10-14 days.  Follow up with your stomach doctor.  Call or go to the emergency room if you get worse, have trouble breathing, have chest pains, or palpitations.   Pharyngitis Pharyngitis is redness, pain, and swelling (inflammation) of your pharynx. What are the causes? Pharyngitis is usually caused by infection. Most of the time, these infections are from viruses (viral) and are part of a cold. However, sometimes pharyngitis is caused by bacteria (bacterial). Pharyngitis can also be caused by allergies. Viral pharyngitis may be spread from person to person by coughing, sneezing, and personal items or utensils (cups, forks, spoons, toothbrushes). Bacterial pharyngitis may be spread from person to person by more intimate contact, such as kissing. What are the signs or symptoms? Symptoms of pharyngitis include:  Sore throat.  Tiredness (fatigue).  Low-grade fever.  Headache.  Joint pain and muscle aches.  Skin rashes.  Swollen lymph nodes.  Plaque-like film on throat or tonsils (often seen with bacterial pharyngitis). How is this diagnosed? Your health care provider will ask you questions about your illness and your symptoms. Your medical history, along with a physical exam, is often all that is needed to diagnose pharyngitis. Sometimes, a rapid strep test is done. Other lab tests may also be done, depending on the suspected cause. How is this treated? Viral pharyngitis will usually get better in 3-4 days without the use of medicine. Bacterial pharyngitis is treated with medicines that kill germs (antibiotics). Follow these instructions at home:  Drink enough water and fluids to keep your urine clear or pale yellow.  Only take over-the-counter or prescription medicines as directed by your health care provider:  If you are prescribed antibiotics, make sure you finish them even if you start to feel  better.  Do not take aspirin.  Get lots of rest.  Gargle with 8 oz of salt water ( tsp of salt per 1 qt of water) as often as every 1-2 hours to soothe your throat.  Throat lozenges (if you are not at risk for choking) or sprays may be used to soothe your throat. Contact a health care provider if:  You have large, tender lumps in your neck.  You have a rash.  You cough up green, yellow-brown, or bloody spit. Get help right away if:  Your neck becomes stiff.  You drool or are unable to swallow liquids.  You vomit or are unable to keep medicines or liquids down.  You have severe pain that does not go away with the use of recommended medicines.  You have trouble breathing (not caused by a stuffy nose). This information is not intended to replace advice given to you by your health care provider. Make sure you discuss any questions you have with your health care provider. Document Released: 06/03/2005 Document Revised: 11/09/2015 Document Reviewed: 02/08/2013 Elsevier Interactive Patient Education  2017 [employer].

## 2016-07-11 ENCOUNTER — Encounter: Payer: Medicare Other | Admitting: Rehabilitative and Restorative Service Providers"

## 2016-07-17 DIAGNOSIS — M48062 Spinal stenosis, lumbar region with neurogenic claudication: Secondary | ICD-10-CM | POA: Diagnosis not present

## 2016-07-17 DIAGNOSIS — M545 Low back pain: Secondary | ICD-10-CM | POA: Diagnosis not present

## 2016-07-17 DIAGNOSIS — M5416 Radiculopathy, lumbar region: Secondary | ICD-10-CM | POA: Diagnosis not present

## 2016-07-22 DIAGNOSIS — I10 Essential (primary) hypertension: Secondary | ICD-10-CM | POA: Diagnosis not present

## 2016-07-22 DIAGNOSIS — K581 Irritable bowel syndrome with constipation: Secondary | ICD-10-CM | POA: Diagnosis not present

## 2016-07-22 DIAGNOSIS — R1011 Right upper quadrant pain: Secondary | ICD-10-CM | POA: Diagnosis not present

## 2016-07-30 ENCOUNTER — Ambulatory Visit (INDEPENDENT_AMBULATORY_CARE_PROVIDER_SITE_OTHER): Payer: Medicare Other | Admitting: Rehabilitative and Restorative Service Providers"

## 2016-07-30 ENCOUNTER — Encounter: Payer: Self-pay | Admitting: Rehabilitative and Restorative Service Providers"

## 2016-07-30 DIAGNOSIS — R293 Abnormal posture: Secondary | ICD-10-CM | POA: Diagnosis not present

## 2016-07-30 DIAGNOSIS — R29898 Other symptoms and signs involving the musculoskeletal system: Secondary | ICD-10-CM

## 2016-07-30 DIAGNOSIS — M5416 Radiculopathy, lumbar region: Secondary | ICD-10-CM | POA: Diagnosis not present

## 2016-07-30 DIAGNOSIS — M6281 Muscle weakness (generalized): Secondary | ICD-10-CM | POA: Diagnosis not present

## 2016-07-30 NOTE — Patient Instructions (Addendum)
Bent Leg Lift (Hook-Lying)    Tighten core and slowly raise right leg ___10_ inches from floor. Keep trunk rigid.  Repeat _10_ times per set. Do ___1-2_ sets per session. Do ___1_ sessions per day.   Bridging    Slowly raise buttocks from floor, keeping stomach core. Hold 3 sec Repeat _10__ times per set. Do __1-2__ sets per session. Do ___1_ sessions per day.    Heel Walk (Hook-Lying)    Tighten core and drop knees out to the side then return. Repeat __10__ times per set. Do __1-2__ sets per session. Do ___1_ sessions per day.    Trigger Point Dry Needling  . What is Trigger Point Dry Needling (DN)? o DN is a physical therapy technique used to treat muscle pain and dysfunction. Specifically, DN helps deactivate muscle trigger points (muscle knots).  o A thin filiform needle is used to penetrate the skin and stimulate the underlying trigger point. The goal is for a local twitch response (LTR) to occur and for the trigger point to relax. No medication of any kind is injected during the procedure.   . What Does Trigger Point Dry Needling Feel Like?  o The procedure feels different for each individual patient. Some patients report that they do not actually feel the needle enter the skin and overall the process is not painful. Very mild bleeding may occur. However, many patients feel a deep cramping in the muscle in which the needle was inserted. This is the local twitch response.   Marland Kitchen How Will I feel after the treatment? o Soreness is normal, and the onset of soreness may not occur for a few hours. Typically this soreness does not last longer than two days.  o Bruising is uncommon, however; ice can be used to decrease any possible bruising.  o In rare cases feeling tired or nauseous after the treatment is normal. In addition, your symptoms may get worse before they get better, this period will typically not last longer than 24 hours.   . What Can I do After My  Treatment? o Increase your hydration by drinking more water for the next 24 hours. o You may place ice or heat on the areas treated that have become sore, however, do not use heat on inflamed or bruised areas. Heat often brings more relief post needling. o You can continue your regular activities, but vigorous activity is not recommended initially after the treatment for 24 hours. o DN is best combined with other physical therapy such as strengthening, stretching, and other therapies.

## 2016-07-30 NOTE — Therapy (Signed)
[place], [place], [address] Phone: [phone]   Fax:  [phone]  Physical Therapy Treatment  Patient Details  Name: GENESES JAIN MRN: [id number] Date of Birth: Mar 10, 1946 Referring Provider: Dr Lynne Leader  Encounter Date: 07/30/2016      PT End of Session - 07/30/16 1152    Visit Number 2   Number of Visits 12   Date for PT Re-Evaluation 08/13/16   PT Start Time 1146   PT Stop Time 1240   PT Time Calculation (min) 54 min   Activity Tolerance Patient tolerated treatment well      Past Medical History:  Diagnosis Date  . Anxiety   . Atrial fibrillation ([place])   . CAD (coronary artery disease)   . Claudication ([place]) 01/06/2008   Lower extremity dopplers - no evidence of arterial insufficiency, normal exam  . Coronary artery disease   . GERD (gastroesophageal reflux disease)   . Hyperlipidemia   . Hypertension 11/30/2010   echo- EF 55%; normal w/ mildly sclerotic aortic valve  . Hypertension 11/05/11   renal dopplers - celiac artery and SMA >50% diameter reduction, R renal artery - mildly elevated velocities 1-59% diameter reduction, L renal artery normal  . Nonspecific ST-T wave electrocardiographic changes 03/26/2011   R/Lmv - EF 74%, normal perfusion all regions, ST depression w/ Lexiscan infusion w/o assoc angina  . Osteopenia   . PAD (peripheral artery disease) ([place])   . Peripheral vascular disease ([place])   . PVD (peripheral vascular disease) ([place]) 11/05/2011   doppler - R/L brachial pressures essentially equal w/o inflow disease; L sublclavian/CCA bypass graft demonstrates patent flow, no evidence of significant stenosis  . Sigmoid diverticulitis     Past Surgical History:  Procedure Laterality Date  . ABDOMINAL AORTIC ANEURYSM REPAIR N/A 01/27/2014   Procedure: AORTO-SUPERIOR MESENTERIC ARTERY BYPASS GRAFT;  Surgeon: Angelia Mould, MD;  Location: [facility];  Service:  Vascular;  Laterality: N/A;  . ABDOMINAL HYSTERECTOMY    . APPENDECTOMY    . CARDIAC CATHETERIZATION  06/03/2008   60% LAD, involving D2, borderline significant by IVUS, medical therapy. CFX, RCA OK.  . CHOLECYSTECTOMY    . EYE SURGERY     retinal surgery  . SPINE SURGERY  Feb. 2011  . [place]   X's  several  . SUBCLAVIAN ARTERY STENT Left 09/20/2008   LSA ISR 7x22mm Cordis Genesis on Opta premount, reduction from 80% to 0%  . subclavian artery stents  01/23/2010   Left carotid to subclavian artery Bypass  . VISCERAL ANGIOGRAM  01/17/2014   Procedure: VISCERAL ANGIOGRAM;  Surgeon: Angelia Mould, MD;  Location: [facility] CATH LAB;  Service: Cardiovascular;;    There were no vitals filed for this visit.      Subjective Assessment - 07/30/16 1153    Subjective Patient reports that she has had the flu twice and has been sick for the past few weeks. She has doen some of her exercises. She saw the surgeon 07/17/16 and the surgeon and he also wanted her to continue with PT. Patient reports that she is compulsive about her housework and unabel to take breaks which increases the pain in the back and Lt leg.    Currently in Pain? No/denies            [facility] PT Assessment - 07/30/16 0001      Assessment   Medical Diagnosis Lt lumbar radiculopathy    Referring  Provider Dr Lynne Leader   Onset Date/Surgical Date 04/17/16   Hand Dominance Right   Next MD Visit PRN  appt with Dr Erline Levine 07/17/16   Prior Therapy yes - 6 months just after surgery      Sensation   Additional Comments intermittently tingling lateral Lt thigh      AROM   AROM Assessment Site --  pull and discomfort w/extension; Rt lateral flexion/rotation   Lumbar Flexion 65%   Lumbar Extension 40%   Lumbar - Right Side Bend 70%   Lumbar - Left Side Bend 75%   Lumbar - Right Rotation 25%   Lumbar - Left Rotation 20%      Strength   Left Hip Flexion 4+/5   Left Hip Extension 4/5   Left Hip  ABduction 4/5   Left Hip ADduction 4-/5   Left Knee Flexion 4+/5   Left Knee Extension 4/5   Left Ankle Plantar Flexion 4+/5  10 heel lifts with UE support unsteady      Flexibility   Hamstrings tight Rt 75 deg; Lt 70 deg with Lt LE pain posterior thigh     Quadriceps tight Rt 100 deg; Lt 90 deg with pain Lt anterior thigh    ITB tight Lt > Rt    Piriformis tight Lt      Palpation   Spinal mobility decreased pain and continued limited mobility noted with CPA mobilization lumbar spine    Palpation comment tightness and tenderness noted lumbar paraspinals and QL Lt; Lt piriformis                      [facility] Adult PT Treatment/Exercise - 07/30/16 0001      Lumbar Exercises: Stretches   Passive Hamstring Stretch 3 reps;30 seconds   Quad Stretch 3 reps;30 seconds  prone with strap    ITB Stretch 2 reps;30 seconds  poor technique     Lumbar Exercises: Supine   Ab Set --  3 part core 10 sec x 10    Clam 10 reps  with core engaged   Bent Knee Raise 10 reps  with core engaged   Bridge 10 reps;3 seconds  core engaged      Moist Heat Therapy   Number Minutes Moist Heat 20 Minutes   Moist Heat Location Lumbar Spine     Electrical Stimulation   Electrical Stimulation Location Rt/Lt L4/5 to Lt lumbar/proximal piriformis area    Electrical Stimulation Action IFC   Electrical Stimulation Parameters to tolerance   Electrical Stimulation Goals Pain;Tone     Manual Therapy   Manual therapy comments pt prone    Joint Mobilization CPA spinal mobs Grade II/III    Soft tissue mobilization soft tissue work Lt lumbar and proximal hip area   Myofascial Release Lt lumbar                 PT Education - 07/30/16 1208    Education provided Yes   Education Details HEP DN   Person(s) Educated Patient   Methods Explanation;Demonstration;Tactile cues;Verbal cues;Handout   Comprehension Verbalized understanding;Returned demonstration;Verbal cues required;Tactile cues  required             PT Long Term Goals - 07/30/16 1152      PT LONG TERM GOAL #1   Title Improve posture and alignment with resolution of Rt lateral shift 08/13/16   Time 6   Period Weeks   Status On-going     PT LONG  TERM GOAL #2   Title Increase Lt LE strength to 5-/5 to 5/5 as neurological symptoms allow 08/13/16   Time 6   Period Weeks   Status On-going     PT LONG TERM GOAL #3   Title Improve sitting and standing tolerance to 45-60 minutes to increase functional activity level 08/13/16   Time 6   Period Weeks   Status On-going     PT LONG TERM GOAL #4   Title Independent in HEP 08/13/16   Time 6   Period Weeks   Status On-going     PT LONG TERM GOAL #5   Title Improve FOTO to </= 34% limitation 08/13/16   Time 6   Period Weeks   Status On-going               Plan - 07/30/16 1235    Clinical Impression Statement Patient returns to PT after having been sick with the flu twice in the past month. She continues to have pain in the lumbar spine and into the Lt LE. Objective findings are unchanged. Goals remain the same. Patient tolerated exercise and manual work well today.    Rehab Potential Good   PT Frequency 2x / week   PT Duration 6 weeks   PT Treatment/Interventions Patient/family education;ADLs/Self Care Home Management;Cryotherapy;Electrical Stimulation;Iontophoresis 4mg /ml Dexamethasone;Moist Heat;Traction;Ultrasound;Dry needling;Manual techniques;Therapeutic activities;Therapeutic exercise;Taping;Neuromuscular re-education   PT Next Visit Plan progress with core stabilization; gentle stretching; stablization; manual work; modalities   Consulted and Agree with Plan of Care Patient      Patient will benefit from skilled therapeutic intervention in order to improve the following deficits and impairments:  Postural dysfunction, Improper body mechanics, Pain, Increased muscle spasms, Decreased range of motion, Decreased strength, Decreased activity  tolerance  Visit Diagnosis: Lumbar radiculopathy  Muscle weakness (generalized)  Abnormal posture  Other symptoms and signs involving the musculoskeletal system     Problem List Patient Active Problem List   Diagnosis Date Noted  . Acute bilateral low back pain with left-sided sciatica 06/06/2016  . Osteopenia 12/13/2015  . Menopause 12/08/2015  . History of colonoscopy 07/17/2015  . Irregular heartbeat 07/26/2014  . 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 Preoperative evaluation of a medical condition to rule out surgical contraindications (TAR required) 01/17/2014  . 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[place]) 01/16/2014  . Hyperlipidemia 04/05/2013  . Pre-syncope 12/06/2012  . Hypertension 12/06/2012  . Peripheral arterial disease ([place]) 12/06/2012  . Coronary artery disease, non-occlusive, last cath 2009 12/06/2012  . Hyponatremia, improved holding diuretic 12/06/2012  . Atherosclerosis of other specified arteries 04/02/2012  . 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 Stricture of artery ([place]) 10/02/2011  . Subclavian artery stenosis, left ([place]) 10/02/2011    Malacki Mcphearson Nilda Simmer PT, MPH  07/30/2016, 12:39 PM  [facility] [place], [place], [address] Phone: [phone]   Fax:  [phone]  Name: LATWANA MACDUFF MRN: [id number] Date of Birth: 03-06-1946

## 2016-08-01 ENCOUNTER — Encounter: Payer: Self-pay | Admitting: Rehabilitative and Restorative Service Providers"

## 2016-08-01 ENCOUNTER — Ambulatory Visit (INDEPENDENT_AMBULATORY_CARE_PROVIDER_SITE_OTHER): Payer: Medicare Other | Admitting: Rehabilitative and Restorative Service Providers"

## 2016-08-01 DIAGNOSIS — R29898 Other symptoms and signs involving the musculoskeletal system: Secondary | ICD-10-CM | POA: Diagnosis not present

## 2016-08-01 DIAGNOSIS — M5416 Radiculopathy, lumbar region: Secondary | ICD-10-CM | POA: Diagnosis not present

## 2016-08-01 DIAGNOSIS — M6281 Muscle weakness (generalized): Secondary | ICD-10-CM

## 2016-08-01 DIAGNOSIS — R293 Abnormal posture: Secondary | ICD-10-CM | POA: Diagnosis not present

## 2016-08-01 NOTE — Therapy (Signed)
[place], [place], [address] Phone: [phone]   Fax:  [phone]  Physical Therapy Treatment  Patient Details  Name: Traci [name] MRN: [id number] Date of Birth: November 21, 1945 Referring Provider: Dr Lynne Leader  Encounter Date: 08/01/2016      PT End of Session - 08/01/16 1110    Visit Number 3   Number of Visits 12   Date for PT Re-Evaluation 08/13/16   PT Start Time 1100   PT Stop Time 1153   PT Time Calculation (min) 53 min   Activity Tolerance Patient tolerated treatment well      Past Medical History:  Diagnosis Date  . Anxiety   . Atrial fibrillation ([place])   . CAD (coronary artery disease)   . Claudication ([place]) 01/06/2008   Lower extremity dopplers - no evidence of arterial insufficiency, normal exam  . Coronary artery disease   . GERD (gastroesophageal reflux disease)   . Hyperlipidemia   . Hypertension 11/30/2010   echo- EF 55%; normal w/ mildly sclerotic aortic valve  . Hypertension 11/05/11   renal dopplers - celiac artery and SMA >50% diameter reduction, R renal artery - mildly elevated velocities 1-59% diameter reduction, L renal artery normal  . Nonspecific ST-T wave electrocardiographic changes 03/26/2011   R/Lmv - EF 74%, normal perfusion all regions, ST depression w/ Lexiscan infusion w/o assoc angina  . Osteopenia   . PAD (peripheral artery disease) ([place])   . Peripheral vascular disease ([place])   . PVD (peripheral vascular disease) ([place]) 11/05/2011   doppler - R/L brachial pressures essentially equal w/o inflow disease; L sublclavian/CCA bypass graft demonstrates patent flow, no evidence of significant stenosis  . Sigmoid diverticulitis     Past Surgical History:  Procedure Laterality Date  . ABDOMINAL AORTIC ANEURYSM REPAIR N/A 01/27/2014   Procedure: AORTO-SUPERIOR MESENTERIC ARTERY BYPASS GRAFT;  Surgeon: Angelia Mould, MD;  Location: [facility];  Service:  Vascular;  Laterality: N/A;  . ABDOMINAL HYSTERECTOMY    . APPENDECTOMY    . CARDIAC CATHETERIZATION  06/03/2008   60% LAD, involving D2, borderline significant by IVUS, medical therapy. CFX, RCA OK.  . CHOLECYSTECTOMY    . EYE SURGERY     retinal surgery  . SPINE SURGERY  Feb. 2011  . [place]   X's  several  . SUBCLAVIAN ARTERY STENT Left 09/20/2008   LSA ISR 7x67mm Cordis Genesis on Opta premount, reduction from 80% to 0%  . subclavian artery stents  01/23/2010   Left carotid to subclavian artery Bypass  . VISCERAL ANGIOGRAM  01/17/2014   Procedure: VISCERAL ANGIOGRAM;  Surgeon: Angelia Mould, MD;  Location[facility] C[facility] LAB;  Service: Cardiovascular;;    There were no vitals filed for this visit.      Subjective Assessment - 08/01/16 1113    Subjective May be a bit better. Doing her exercises at home. Not sure she wants to try DN - will think about it.    Currently in Pain? Yes   Pain Score 2    Pain Location Back   Pain Orientation Lower;Left   Pain Descriptors / Indicators Aching   Pain Type Acute pain   Pain Onset More than a month ago   Pain Frequency Intermittent   Multiple Pain Sites No   Pain Score 0   Pain Location Leg   Pain Orientation Left  [facility] Adult PT Treatment/Exercise - 08/01/16 0001      Lumbar Exercises: Stretches   Passive Hamstring Stretch 3 reps;30 seconds   Quad Stretch 3 reps;30 seconds  prone with strap    ITB Stretch 2 reps;30 seconds  poor technique     Lumbar Exercises: Supine   Ab Set --  3 part core 10 sec x 10    Clam 10 reps  with core engaged   Bent Knee Raise 10 reps  with core engaged   Bridge 10 reps;3 seconds  core engaged      Moist Heat Therapy   Number Minutes Moist Heat 20 Minutes   Moist Heat Location Lumbar Spine     Electrical Stimulation   Electrical Stimulation Location Rt/Lt L4/5 to Lt lumbar/proximal piriformis area    Electrical Stimulation  Action IFC   Electrical Stimulation Parameters to tolerance   Electrical Stimulation Goals Pain;Tone     Ultrasound   Ultrasound Location Lt lumbar along iliac crest to lumbar vetebrae    Ultrasound Parameters 100%; 1.5 w/cm2; 1 mHz; 8 min    Ultrasound Goals Other (Comment);Pain  tissue tightness     Manual Therapy   Manual therapy comments pt prone    Joint Mobilization CPA spinal mobs Grade II/III    Soft tissue mobilization soft tissue work Lt lumbar and proximal hip area   Myofascial Release Lt lumbar                      PT Long Term Goals - 07/30/16 1152      PT LONG TERM GOAL #1   Title Improve posture and alignment with resolution of Rt lateral shift 08/13/16   Time 6   Period Weeks   Status On-going     PT LONG TERM GOAL #2   Title Increase Lt LE strength to 5-/5 to 5/5 as neurological symptoms allow 08/13/16   Time 6   Period Weeks   Status On-going     PT LONG TERM GOAL #3   Title Improve sitting and standing tolerance to 45-60 minutes to increase functional activity level 08/13/16   Time 6   Period Weeks   Status On-going     PT LONG TERM GOAL #4   Title Independent in HEP 08/13/16   Time 6   Period Weeks   Status On-going     PT LONG TERM GOAL #5   Title Improve FOTO to </= 34% limitation 08/13/16   Time 6   Period Weeks   Status On-going               Plan - 08/01/16 1111    Clinical Impression Statement Patient hesitant to try DN - will think about it over the weekend. Trial of [place] to area of tightness at Lt lumbar region followed by deep tissue work. Cotninued exercise. Consider DN depending on response to intervention.    Rehab Potential Good   PT Frequency 2x / week   PT Duration 6 weeks   PT Treatment/Interventions Patient/family education;ADLs/Self Care Home Management;Cryotherapy;Electrical Stimulation;Iontophoresis 4mg /ml Dexamethasone;Moist Heat;Traction;Ultrasound;Dry needling;Manual techniques;Therapeutic  activities;Therapeutic exercise;Taping;Neuromuscular re-education   PT Next Visit Plan progress with core stabilization; gentle stretching; stablization; manual work; modalities; assess response to [place] and deep tissue work.    Consulted and Agree with Plan of Care Patient      Patient will benefit from skilled therapeutic intervention in order to improve the following deficits and impairments:  Postural dysfunction, Improper body mechanics,  Pain, Increased muscle spasms, Decreased range of motion, Decreased strength, Decreased activity tolerance  Visit Diagnosis: Lumbar radiculopathy  Muscle weakness (generalized)  Abnormal posture  Other symptoms and signs involving the musculoskeletal system     Problem List Patient Active Problem List   Diagnosis Date Noted  . Acute bilateral low back pain with left-sided sciatica 06/06/2016  . Osteopenia 12/13/2015  . Menopause 12/08/2015  . History of colonoscopy 07/17/2015  . Irregular heartbeat 07/26/2014  . 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 Preoperative evaluation of a medical condition to rule out surgical contraindications (TAR required) 01/17/2014  . 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[place]) 01/16/2014  . Hyperlipidemia 04/05/2013  . Pre-syncope 12/06/2012  . Hypertension 12/06/2012  . Peripheral arterial disease ([place]) 12/06/2012  . Coronary artery disease, non-occlusive, last cath 2009 12/06/2012  . Hyponatremia, improved holding diuretic 12/06/2012  . Atherosclerosis of other specified arteries 04/02/2012  . 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 Stricture of artery ([place]) 10/02/2011  . Subclavian artery stenosis, left ([facility]) 10/02/2011    Skarlett Sedlacek Nilda Simmer PT, MPH 08/01/2016, 11:42 AM  [facility] [place], [place], [address] Phone: [phone]   Fax:  [phone]  Name: GREYLYN BURTNESS MRN: [id number] Date of Birth: 1946-05-18

## 2016-08-05 ENCOUNTER — Encounter: Payer: Self-pay | Admitting: *Deleted

## 2016-08-05 ENCOUNTER — Emergency Department (INDEPENDENT_AMBULATORY_CARE_PROVIDER_SITE_OTHER)
Admission: EM | Admit: 2016-08-05 | Discharge: 2016-08-05 | Disposition: A | Discharge status: Home / Self Care | Payer: Medicare Other | Source: Home / Self Care | Attending: Family Medicine | Admitting: Family Medicine

## 2016-08-05 ENCOUNTER — Encounter: Payer: Medicare Other | Admitting: Rehabilitative and Restorative Service Providers"

## 2016-08-05 ENCOUNTER — Emergency Department (INDEPENDENT_AMBULATORY_CARE_PROVIDER_SITE_OTHER): Payer: Medicare Other

## 2016-08-05 DIAGNOSIS — R05 Cough: Secondary | ICD-10-CM | POA: Diagnosis not present

## 2016-08-05 DIAGNOSIS — R6889 Other general symptoms and signs: Secondary | ICD-10-CM

## 2016-08-05 DIAGNOSIS — J984 Other disorders of lung: Secondary | ICD-10-CM | POA: Diagnosis not present

## 2016-08-05 DIAGNOSIS — I700 Atherosclerosis of aorta: Secondary | ICD-10-CM

## 2016-08-05 MED ORDER — ONDANSETRON 4 MG PO TBDP
4.0000 mg | ORAL_TABLET | Freq: Once | ORAL | Status: AC
  Administered 2016-08-05: 4 mg via ORAL

## 2016-08-05 MED ORDER — ONDANSETRON HCL 4 MG PO TABS: 4.0000 mg | ORAL_TABLET | Freq: Three times a day (TID) | ORAL | 0 refills | Status: DC | PRN

## 2016-08-05 MED ORDER — ACETAMINOPHEN 500 MG PO TABS
500.0000 mg | ORAL_TABLET | Freq: Once | ORAL | Status: AC
  Administered 2016-08-05: 500 mg via ORAL

## 2016-08-05 MED ORDER — OSELTAMIVIR PHOSPHATE 75 MG PO CAPS: 75.0000 mg | ORAL_CAPSULE | Freq: Two times a day (BID) | ORAL | 0 refills | Status: DC

## 2016-08-05 NOTE — ED Provider Notes (Signed)
CSN: [id number]     Arrival date & time 08/05/16  [id number] History   First MD Initiated Contact with Patient 08/05/16 [phone]     Chief Complaint  Patient presents with  . Generalized Body Aches  . Diarrhea  . Chills  . Shortness of Breath   (Consider location/radiation/quality/duration/timing/severity/associated sxs/prior Treatment) HPI Traci [name] is a 71 y.o. female presenting to UC with c/o sudden onset flu-like symptoms late last night/early morning "every symptom of the flu you can think of" including body aches, fever, chills, sweats, diarrhea, SOB, and burning in her esophagus. Pt was seen by her PCP last month on 06/26/16 with presumed flulike illness.  She was treated with Tamiflu as well as completed a course of omnicef.  Symptoms did resolve and she felt better for a few weeks but not she feels worse than she did then.  Pt feels like she got hit by a truck.  Pt notes she was around 2 people on Friday who both developed flu-like symptoms. She thinks she got sick from them.     Past Medical History:  Diagnosis Date  . Anxiety   . Atrial fibrillation ([place])   . CAD (coronary artery disease)   . Claudication ([place]) 01/06/2008   Lower extremity dopplers - no evidence of arterial insufficiency, normal exam  . Coronary artery disease   . GERD (gastroesophageal reflux disease)   . Hyperlipidemia   . Hypertension 11/30/2010   echo- EF 55%; normal w/ mildly sclerotic aortic valve  . Hypertension 11/05/11   renal dopplers - celiac artery and SMA >50% diameter reduction, R renal artery - mildly elevated velocities 1-59% diameter reduction, L renal artery normal  . Nonspecific ST-T wave electrocardiographic changes 03/26/2011   R/Lmv - EF 74%, normal perfusion all regions, ST depression w/ Lexiscan infusion w/o assoc angina  . Osteopenia   . PAD (peripheral artery disease) ([place])   . Peripheral vascular disease ([place])   . PVD (peripheral vascular disease) ([place]) 11/05/2011   doppler -  R/L brachial pressures essentially equal w/o inflow disease; L sublclavian/CCA bypass graft demonstrates patent flow, no evidence of significant stenosis  . Sigmoid diverticulitis    Past Surgical History:  Procedure Laterality Date  . ABDOMINAL AORTIC ANEURYSM REPAIR N/A 01/27/2014   Procedure: AORTO-SUPERIOR MESENTERIC ARTERY BYPASS GRAFT;  Surgeon: Angelia Mould, MD;  Location: [facility];  Service: Vascular;  Laterality: N/A;  . ABDOMINAL HYSTERECTOMY    . APPENDECTOMY    . CARDIAC CATHETERIZATION  06/03/2008   60% LAD, involving D2, borderline significant by IVUS, medical therapy. CFX, RCA OK.  . CHOLECYSTECTOMY    . EYE SURGERY     retinal surgery  . SPINE SURGERY  Feb. 2011  . [place]   X's  several  . SUBCLAVIAN ARTERY STENT Left 09/20/2008   LSA ISR 7x72mm Cordis Genesis on Opta premount, reduction from 80% to 0%  . subclavian artery stents  01/23/2010   Left carotid to subclavian artery Bypass  . VISCERAL ANGIOGRAM  01/17/2014   Procedure: VISCERAL ANGIOGRAM;  Surgeon: Angelia Mould, MD;  Location: [facility] CATH LAB;  Service: Cardiovascular;;   Family History  Problem Relation Age of Onset  . Heart disease Father     Heart Disease before age 46  . Kidney disease Father   . Heart attack Father   . Hyperlipidemia Father   . Hypertension Father   . Diabetes Mother     Varicose Veins  . Diabetes Maternal  Grandmother   . Heart disease Paternal Grandfather    Social History  Substance Use Topics  . Smoking status: Former Smoker    Types: Cigarettes    Quit date: 09/30/1993  . Smokeless tobacco: Never Used  . Alcohol use No   OB History    No data available     Review of Systems  Constitutional: Positive for chills, diaphoresis, fatigue and fever.  HENT: Positive for congestion. Negative for ear pain, sore throat, trouble swallowing and voice change.   Respiratory: Positive for cough. Negative for shortness of breath.   Cardiovascular:  Negative for chest pain and palpitations.  Gastrointestinal: Positive for diarrhea and nausea. Negative for abdominal pain and vomiting.  Musculoskeletal: Positive for arthralgias and myalgias. Negative for back pain.  Skin: Negative for rash.  Neurological: Positive for headaches. Negative for dizziness and light-headedness.    Allergies  Cortisone; Dilaudid [hydromorphone hcl]; Iodine; Medrol [methylprednisolone]; Omnipaque [iohexol]; Prednisone; Shellfish allergy; Sulfa drugs cross reactors; Augmentin [amoxicillin-pot clavulanate]; Morphine and related; Codeine; and Erythromycin  Home Medications   Prior to Admission medications   Medication Sig Start Date End Date Taking? Authorizing Provider  aspirin EC 81 MG tablet Take 81 mg by mouth daily.    Historical Provider, MD  ATACAND 16 MG tablet TAKE ONE-HALF (1/2) TABLET EVERY MORNING 10/31/15   Mihai Croitoru, MD  atenolol (TENORMIN) 25 MG tablet Take 1 tablet (25 mg total) by mouth daily. 02/08/16   Mihai Croitoru, MD  atorvastatin (LIPITOR) 10 MG tablet TAKE 1 TABLET EVERY EVENING 10/31/15   Mihai Croitoru, MD  cholecalciferol (VITAMIN D) 1000 UNITS tablet Take 1,000 Units by mouth daily.    Historical Provider, MD  clopidogrel (PLAVIX) 75 MG tablet TAKE 1 TABLET DAILY 10/31/15   Mihai Croitoru, MD  clorazepate (TRANXENE) 7.5 MG tablet Take 1 tablet (7.5 mg total) by mouth daily as needed. 05/01/16   Gregor Hams, MD  cyanocobalamin 1000 MCG tablet Take 100 mcg by mouth daily.    Historical Provider, MD  EPINEPHrine (EPIPEN 2-PAK) 0.3 mg/0.3 mL IJ SOAJ injection Inject 0.3 mLs (0.3 mg total) into the muscle as needed (for allergic reaction). 10/02/15   Sean Hommel, DO  esomeprazole (NEXIUM) 40 MG capsule Take 1 capsule (40 mg total) by mouth daily. Take 1 tab daily 02/23/15   Mihai Croitoru, MD  estradiol (ESTRACE) 0.5 MG tablet Take 1 tablet (0.5 mg total) by mouth daily. 06/26/16   Gregor Hams, MD  furosemide (LASIX) 20 MG tablet TAKE 1  TABLET EVERY OTHER DAY 11/20/15   Mihai Croitoru, MD  ipratropium (ATROVENT) 0.06 % nasal spray Place 2 sprays into both nostrils every 4 (four) hours as needed for rhinitis. 06/06/16   Gregor Hams, MD  isosorbide mononitrate (IMDUR) 30 MG 24 hr tablet Take 1 tablet (30 mg total) by mouth at bedtime. 04/08/16 07/07/16  Mihai Croitoru, MD  linaclotide (LINZESS) 145 MCG CAPS capsule Take 1 capsule by mouth daily as needed. 12/22/15   Historical Provider, MD  meclizine (ANTIVERT) 25 MG tablet Take 0.5 tablets (12.5 mg total) by mouth 3 (three) times daily as needed for dizziness or nausea. 05/01/16   Gregor Hams, MD  Multiple Vitamin (MULTIVITAMIN WITH MINERALS) TABS Take 1 tablet by mouth daily.    Historical Provider, MD  nitroGLYCERIN (NITROSTAT) 0.4 MG SL tablet Place 1 tablet (0.4 mg total) under the tongue every 5 (five) minutes as needed for chest pain. 04/08/16 07/07/16  Mihai Croitoru, MD  nystatin (MYCOSTATIN)  100000 UNIT/ML suspension Take 5 mLs (500,000 Units total) by mouth 4 (four) times daily. Swish for 30 seconds and swallow. 14 days 07/10/16   Gregor Hams, MD  ondansetron (ZOFRAN) 4 MG tablet Take 1 tablet (4 mg total) by mouth every 8 (eight) hours as needed for nausea or vomiting. 08/05/16   Noland Fordyce, PA-C  oseltamivir (TAMIFLU) 75 MG capsule Take 1 capsule (75 mg total) by mouth every 12 (twelve) hours. 08/05/16   Noland Fordyce, PA-C  polyethylene glycol (MIRALAX / GLYCOLAX) packet Take 17 g by mouth 2 (two) times daily. Until stooling regularly Patient taking differently: Take 8 g by mouth once. Until stooling regularly 11/09/14   Silverio Decamp, MD  zolpidem (AMBIEN) 10 MG tablet Take 1 tablet (10 mg total) by mouth at bedtime as needed for sleep. 05/01/16   Gregor Hams, MD   Meds Ordered and Administered this Visit   Medications  acetaminophen (TYLENOL) tablet 500 mg (500 mg Oral Given 08/05/16 0856)  ondansetron (ZOFRAN-ODT) disintegrating tablet 4 mg (4 mg Oral Given  08/05/16 0856)    BP 104/58 (BP Location: Left Arm)   Pulse 81   Temp 100 F (37.8 C) (Oral)   Resp 20   Wt 150 lb (68 kg)   SpO2 96%   BMI 25.75 kg/m  No data found.   Physical Exam  Constitutional: She is oriented to person, place, and time. She appears well-developed and well-nourished. No distress.  Pt sitting on exam bed, appears mildly fatigued, acutely ill but non-toxic. NAD. Alert and cooperative during exam  HENT:  Head: Normocephalic and atraumatic.  Right Ear: Tympanic membrane normal.  Left Ear: Tympanic membrane normal.  Nose: Mucosal edema present.  Mouth/Throat: Uvula is midline, oropharynx is clear and moist and mucous membranes are normal.  Eyes: EOM are normal.  Neck: Normal range of motion. Neck supple.  Cardiovascular: Normal rate and regular rhythm.   Pulmonary/Chest: Effort normal and breath sounds normal. No stridor. No respiratory distress. She has no wheezes. She has no rales.  Musculoskeletal: Normal range of motion.  Lymphadenopathy:    She has no cervical adenopathy.  Neurological: She is alert and oriented to person, place, and time.  Skin: Skin is warm and dry. She is not diaphoretic.  Psychiatric: She has a normal mood and affect. Her behavior is normal.  Nursing note and vitals reviewed.   Urgent Care Course     Procedures (including critical care time)  Labs Review Labs Reviewed - No data to display  Imaging Review Dg Chest 2 View  Result Date: 08/05/2016 CLINICAL DATA:  Cough and nausea EXAM: CHEST  2 VIEW COMPARISON:  June 06, 2016 FINDINGS: There is slight scarring left base. There is no edema or consolidation. Heart size and pulmonary vascularity are normal. No adenopathy. There is atherosclerotic calcification aorta. There is a stent in the left subclavian artery region. There are surgical clips in left axilla. IMPRESSION: Slight scarring left base. No edema or consolidation. Stable cardiac silhouette. Aortic atherosclerosis.  Electronically Signed   By: Lowella Grip III M.D.   On: 08/05/2016 09:30      MDM   1. Flu-like symptoms    Hx and exam c/w influenza w/o evidence of underlying bacterial infection at this time.  Discussed risks/benefits of Tamiflu. Pt would like to try the treatment. Rx: Tamiflu and zofran  Encouraged fluids, rest, acetaminophen and ibuprofen.  Encouraged f/u with PCP in 1 week if not improving, sooner if worsening.  Noland Fordyce, PA-C 08/05/16 1016

## 2016-08-05 NOTE — ED Triage Notes (Signed)
Patient c/o fever, chills/saweats, body aches and diarrhea x yesterday. Last night/early AM awoke with SOB and burning in her esophagus.

## 2016-08-08 ENCOUNTER — Telehealth: Payer: Self-pay

## 2016-08-08 ENCOUNTER — Encounter: Payer: Medicare Other | Admitting: Rehabilitative and Restorative Service Providers"

## 2016-08-08 NOTE — Telephone Encounter (Signed)
Feeling better.  Temp stays down during the day, and goes up some during the night.  Will follow up with PCP or UC as needed.

## 2016-08-09 ENCOUNTER — Encounter: Payer: Medicare Other | Admitting: Rehabilitative and Restorative Service Providers"

## 2016-08-12 ENCOUNTER — Ambulatory Visit (INDEPENDENT_AMBULATORY_CARE_PROVIDER_SITE_OTHER): Payer: Medicare Other | Admitting: Rehabilitative and Restorative Service Providers"

## 2016-08-12 ENCOUNTER — Encounter: Payer: Self-pay | Admitting: Rehabilitative and Restorative Service Providers"

## 2016-08-12 DIAGNOSIS — R29898 Other symptoms and signs involving the musculoskeletal system: Secondary | ICD-10-CM

## 2016-08-12 DIAGNOSIS — M6281 Muscle weakness (generalized): Secondary | ICD-10-CM | POA: Diagnosis not present

## 2016-08-12 DIAGNOSIS — M5416 Radiculopathy, lumbar region: Secondary | ICD-10-CM | POA: Diagnosis not present

## 2016-08-12 DIAGNOSIS — R293 Abnormal posture: Secondary | ICD-10-CM | POA: Diagnosis not present

## 2016-08-12 NOTE — Therapy (Signed)
[place] [place], [place], [address] Phone: [phone]   Fax:  [phone]  Physical Therapy Treatment  Patient Details  Name: Traci [name] MRN: [id number] Date of Birth: 01/15/1946 Referring Provider: Dr Lynne Leader   Encounter Date: 08/12/2016      PT End of Session - 08/12/16 1104    Visit Number 4   Number of Visits 12   Date for PT Re-Evaluation 09/23/16   PT Start Time 1100   PT Stop Time 1151   PT Time Calculation (min) 51 min   Activity Tolerance Patient tolerated treatment well      Past Medical History:  Diagnosis Date  . Anxiety   . Atrial fibrillation ([place])   . CAD (coronary artery disease)   . Claudication ([place]) 01/06/2008   Lower extremity dopplers - no evidence of arterial insufficiency, normal exam  . Coronary artery disease   . GERD (gastroesophageal reflux disease)   . Hyperlipidemia   . Hypertension 11/30/2010   echo- EF 55%; normal w/ mildly sclerotic aortic valve  . Hypertension 11/05/11   renal dopplers - celiac artery and SMA >50% diameter reduction, R renal artery - mildly elevated velocities 1-59% diameter reduction, L renal artery normal  . Nonspecific ST-T wave electrocardiographic changes 03/26/2011   R/Lmv - EF 74%, normal perfusion all regions, ST depression w/ Lexiscan infusion w/o assoc angina  . Osteopenia   . PAD (peripheral artery disease) ([place])   . Peripheral vascular disease ([place])   . PVD (peripheral vascular disease) ([place]) 11/05/2011   doppler - R/L brachial pressures essentially equal w/o inflow disease; L sublclavian/CCA bypass graft demonstrates patent flow, no evidence of significant stenosis  . Sigmoid diverticulitis     Past Surgical History:  Procedure Laterality Date  . ABDOMINAL AORTIC ANEURYSM REPAIR N/A 01/27/2014   Procedure: AORTO-SUPERIOR MESENTERIC ARTERY BYPASS GRAFT;  Surgeon: Angelia Mould, MD;  Location: [place];  Service:  Vascular;  Laterality: N/A;  . ABDOMINAL HYSTERECTOMY    . APPENDECTOMY    . CARDIAC CATHETERIZATION  06/03/2008   60% LAD, involving D2, borderline significant by IVUS, medical therapy. CFX, RCA OK.  . CHOLECYSTECTOMY    . EYE SURGERY     retinal surgery  . SPINE SURGERY  Feb. 2011  . [place]   X's  several  . SUBCLAVIAN ARTERY STENT Left 09/20/2008   LSA ISR 7x58mm Cordis Genesis on Opta premount, reduction from 80% to 0%  . subclavian artery stents  01/23/2010   Left carotid to subclavian artery Bypass  . VISCERAL ANGIOGRAM  01/17/2014   Procedure: VISCERAL ANGIOGRAM;  Surgeon: Angelia Mould, MD;  Location: [facility] CATH LAB;  Service: Cardiovascular;;    There were no vitals filed for this visit.      Subjective Assessment - 08/12/16 1106    Subjective Has had the flu and has been in the bed for the past week. First day out of her house. Back is about the same - no time to think about her back or work on her exercises.    Currently in Pain? Yes   Pain Score 4    Pain Location Back   Pain Orientation Lower;Left   Pain Descriptors / Indicators Aching   Pain Type Acute pain            [facility] PT Assessment - 08/12/16 0001      Assessment   Medical Diagnosis Lt lumbar radiculopathy  Referring Provider Dr Lynne Leader    Onset Date/Surgical Date 04/17/16   Hand Dominance Right   Next MD Visit PRN  appt with Dr Erline Levine 07/17/16   Prior Therapy yes - 6 months just after surgery      AROM   Lumbar Flexion 60%   Lumbar Extension 40%   Lumbar - Right Side Bend 705   Lumbar - Left Side Bend 70%   Lumbar - Right Rotation 25%   Lumbar - Left Rotation 20%     Strength   Left Hip Flexion 4+/5   Left Hip Extension 4/5   Left Hip ABduction 4/5   Left Hip ADduction 4-/5   Left Knee Flexion 4+/5   Left Knee Extension 4/5   Left Ankle Plantar Flexion 4+/5  10 heel lifts with UE support unsteady      Flexibility   Hamstrings tight Rt 75 deg; Lt 70  deg with Lt LE pain posterior thigh     Quadriceps tight Rt 100 deg; Lt 90 deg with pain Lt anterior thigh    ITB tight Lt > Rt    Piriformis tight Lt      Palpation   Spinal mobility decreased pain and continued limited mobility noted with CPA mobilization lumbar spine    Palpation comment tightness and tenderness noted lumbar paraspinals and QL Lt; Lt piriformis                      [facility] Adult PT Treatment/Exercise - 08/12/16 0001      Lumbar Exercises: Stretches   Passive Hamstring Stretch 3 reps;30 seconds   ITB Stretch 2 reps;30 seconds  poor technique     Lumbar Exercises: Supine   Ab Set --  3 part core 10 sec x 10    Clam 10 reps  with core engaged   Bent Knee Raise 10 reps  with core engaged   Bridge 10 reps;3 seconds  core engaged      Moist Heat Therapy   Number Minutes Moist Heat 20 Minutes   Moist Heat Location Lumbar Spine     Electrical Stimulation   Electrical Stimulation Location Rt/Lt L4/5 to Lt lumbar/proximal piriformis area    Electrical Stimulation Action IFC   Electrical Stimulation Parameters to tolerance   Electrical Stimulation Goals Pain;Tone     Ultrasound   Ultrasound Location Lt lumbar iliac crest to lumbar L3/4/5    Ultrasound Parameters 100%; 1.5 w/cm2; 1 mHz; 8 min    Ultrasound Goals Other (Comment);Pain  tissue tightness     Manual Therapy   Manual therapy comments pt prone    Joint Mobilization CPA spinal mobs Grade II/III    Soft tissue mobilization soft tissue work Lt lumbar and proximal hip area   Myofascial Release Lt lumbar                 PT Education - 08/12/16 1139    Education provided Yes   Education Details HEP review    Person(s) Educated Patient   Methods Explanation;Demonstration;Tactile cues;Verbal cues;Handout   Comprehension Verbalized understanding;Returned demonstration;Verbal cues required;Tactile cues required             PT Long Term Goals - 08/12/16 1107      PT LONG  TERM GOAL #1   Title Improve posture and alignment with resolution of Rt lateral shift 09/23/16   Time 12   Period Weeks   Status Revised     PT LONG TERM  GOAL #2   Title Increase Lt LE strength to 5-/5 to 5/5 as neurological symptoms allow 09/23/16   Time 12   Period Weeks   Status Revised     PT LONG TERM GOAL #3   Title Improve sitting and standing tolerance to 45-60 minutes to increase functional activity level 09/23/16   Time 12   Period Weeks   Status Revised     PT LONG TERM GOAL #4   Title Independent in HEP 09/23/16   Time 12   Period Weeks   Status Revised     PT LONG TERM GOAL #5   Title Improve FOTO to </= 34% limitation 09/23/16   Time 6   Period Weeks   Status Revised               Plan - 08/12/16 1116    Clinical Impression Statement Patient has been sick with the flu for the past week and unable to do her exercises or work on her HEP. She has limited endurance for activity. Back rehab goals will continue to be the same. Consider DN; continue with manual work and modalities as patient progresses with core stabilization and strengthening.    Rehab Potential Good   PT Frequency 2x / week   PT Duration 6 weeks   PT Treatment/Interventions Patient/family education;ADLs/Self Care Home Management;Cryotherapy;Electrical Stimulation;Iontophoresis 4mg /ml Dexamethasone;Moist Heat;Traction;Ultrasound;Dry needling;Manual techniques;Therapeutic activities;Therapeutic exercise;Taping;Neuromuscular re-education   PT Next Visit Plan progress with core stabilization; gentle stretching; stablization; manual work; modalities; assess response to [place] and deep tissue work.    Consulted and Agree with Plan of Care Patient      Patient will benefit from skilled therapeutic intervention in order to improve the following deficits and impairments:  Postural dysfunction, Improper body mechanics, Pain, Increased muscle spasms, Decreased range of motion, Decreased strength, Decreased activity  tolerance  Visit Diagnosis: Lumbar radiculopathy - Plan: PT plan of care cert/re-cert  Muscle weakness (generalized) - Plan: PT plan of care cert/re-cert  Abnormal posture - Plan: PT plan of care cert/re-cert  Other symptoms and signs involving the musculoskeletal system - Plan: PT plan of care cert/re-cert     Problem List Patient Active Problem List   Diagnosis Date Noted  . Acute bilateral low back pain with left-sided sciatica 06/06/2016  . Osteopenia 12/13/2015  . Menopause 12/08/2015  . History of colonoscopy 07/17/2015  . Irregular heartbeat 07/26/2014  . 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 Preoperative evaluation of a medical condition to rule out surgical contraindications (TAR required) 01/17/2014  . 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[place]) 01/16/2014  . Hyperlipidemia 04/05/2013  . Pre-syncope 12/06/2012  . Hypertension 12/06/2012  . Peripheral arterial disease ([place]) 12/06/2012  . Coronary artery disease, non-occlusive, last cath 2009 12/06/2012  . Hyponatremia, improved holding diuretic 12/06/2012  . Atherosclerosis of other specified arteries 04/02/2012  . 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 Stricture of artery ([place]) 10/02/2011  . Subclavian artery stenosis, left ([place]) 10/02/2011    Zaley Talley Nilda Simmer PT, MPH 08/12/2016, 11:42 AM  [facility] [place], [place], [address] Phone: [phone]   Fax:  [phone]  Name: JENEAL HICKMOTT MRN: [id number] Date of Birth: 12-01-1945

## 2016-08-14 ENCOUNTER — Ambulatory Visit (INDEPENDENT_AMBULATORY_CARE_PROVIDER_SITE_OTHER): Payer: Medicare Other | Admitting: Family Medicine

## 2016-08-14 ENCOUNTER — Ambulatory Visit (INDEPENDENT_AMBULATORY_CARE_PROVIDER_SITE_OTHER): Payer: Medicare Other | Admitting: Physical Therapy

## 2016-08-14 ENCOUNTER — Encounter: Payer: Self-pay | Admitting: Family Medicine

## 2016-08-14 VITALS — BP 127/50 | HR 61 | Temp 98.2°F | Wt 149.0 lb

## 2016-08-14 DIAGNOSIS — M5416 Radiculopathy, lumbar region: Secondary | ICD-10-CM

## 2016-08-14 DIAGNOSIS — D229 Melanocytic nevi, unspecified: Secondary | ICD-10-CM

## 2016-08-14 DIAGNOSIS — R29898 Other symptoms and signs involving the musculoskeletal system: Secondary | ICD-10-CM

## 2016-08-14 DIAGNOSIS — R293 Abnormal posture: Secondary | ICD-10-CM | POA: Diagnosis not present

## 2016-08-14 DIAGNOSIS — M6281 Muscle weakness (generalized): Secondary | ICD-10-CM | POA: Diagnosis not present

## 2016-08-14 DIAGNOSIS — D239 Other benign neoplasm of skin, unspecified: Secondary | ICD-10-CM

## 2016-08-14 DIAGNOSIS — D225 Melanocytic nevi of trunk: Secondary | ICD-10-CM | POA: Diagnosis not present

## 2016-08-14 NOTE — Progress Notes (Signed)
Traci [name] is a 71 y.o. female who presents to [facility]: [facility] today for atypical mole.  Patient was in physical therapy this morning when she was found to have a small mole on her right low back. She notes that she's never noticed it before. She thinks it's a new arrival and is changing. She denies fevers or chills. She feels well otherwise.   Past Medical History:  Diagnosis Date  . Anxiety   . Atrial fibrillation ([place])   . CAD (coronary artery disease)   . Claudication ([place]) 01/06/2008   Lower extremity dopplers - no evidence of arterial insufficiency, normal exam  . Coronary artery disease   . GERD (gastroesophageal reflux disease)   . Hyperlipidemia   . Hypertension 11/30/2010   echo- EF 55%; normal w/ mildly sclerotic aortic valve  . Hypertension 11/05/11   renal dopplers - celiac artery and SMA >50% diameter reduction, R renal artery - mildly elevated velocities 1-59% diameter reduction, L renal artery normal  . Nonspecific ST-T wave electrocardiographic changes 03/26/2011   R/Lmv - EF 74%, normal perfusion all regions, ST depression w/ Lexiscan infusion w/o assoc angina  . Osteopenia   . PAD (peripheral artery disease) ([place])   . Peripheral vascular disease ([place])   . PVD (peripheral vascular disease) ([place]) 11/05/2011   doppler - R/L brachial pressures essentially equal w/o inflow disease; L sublclavian/CCA bypass graft demonstrates patent flow, no evidence of significant stenosis  . Sigmoid diverticulitis    Past Surgical History:  Procedure Laterality Date  . ABDOMINAL AORTIC ANEURYSM REPAIR N/A 01/27/2014   Procedure: AORTO-SUPERIOR MESENTERIC ARTERY BYPASS GRAFT;  Surgeon: Angelia Mould, MD;  Location: [place];  Service: Vascular;  Laterality: N/A;  . ABDOMINAL HYSTERECTOMY    . APPENDECTOMY    . CARDIAC CATHETERIZATION  06/03/2008   60%  LAD, involving D2, borderline significant by IVUS, medical therapy. CFX, RCA OK.  . CHOLECYSTECTOMY    . EYE SURGERY     retinal surgery  . SPINE SURGERY  Feb. 2011  . [place]   X's  several  . SUBCLAVIAN ARTERY STENT Left 09/20/2008   LSA ISR 7x63mm Cordis Genesis on Opta premount, reduction from 80% to 0%  . subclavian artery stents  01/23/2010   Left carotid to subclavian artery Bypass  . VISCERAL ANGIOGRAM  01/17/2014   Procedure: VISCERAL ANGIOGRAM;  Surgeon: Angelia Mould, MD;  Location: [facility] CATH LAB;  Service: Cardiovascular;;   Social History  Substance Use Topics  . Smoking status: Former Smoker    Types: Cigarettes    Quit date: 09/30/1993  . Smokeless tobacco: Never Used  . Alcohol use No   family history includes Diabetes in her maternal grandmother and mother; Heart attack in her father; Heart disease in her father and paternal grandfather; Hyperlipidemia in her father; Hypertension in her father; Kidney disease in her father.  ROS as above:  Medications: Current Outpatient Prescriptions  Medication Sig Dispense Refill  . aspirin EC 81 MG tablet Take 81 mg by mouth daily.    . ATACAND 16 MG tablet TAKE ONE-HALF (1/2) TABLET EVERY MORNING 45 tablet 3  . atenolol (TENORMIN) 25 MG tablet Take 1 tablet (25 mg total) by mouth daily. 90 tablet 3  . atorvastatin (LIPITOR) 10 MG tablet TAKE 1 TABLET EVERY EVENING 90 tablet 3  . cholecalciferol (VITAMIN D) 1000 UNITS tablet Take 1,000 Units by mouth daily.    Marland Kitchen  clopidogrel (PLAVIX) 75 MG tablet TAKE 1 TABLET DAILY 90 tablet 3  . clorazepate (TRANXENE) 7.5 MG tablet Take 1 tablet (7.5 mg total) by mouth daily as needed. 90 tablet 1  . cyanocobalamin 1000 MCG tablet Take 100 mcg by mouth daily.    Marland Kitchen EPINEPHrine (EPIPEN 2-PAK) 0.3 mg/0.3 mL IJ SOAJ injection Inject 0.3 mLs (0.3 mg total) into the muscle as needed (for allergic reaction). 2 Device 0  . esomeprazole (NEXIUM) 40 MG capsule Take 1 capsule (40  mg total) by mouth daily. Take 1 tab daily 90 capsule 0  . estradiol (ESTRACE) 0.5 MG tablet Take 1 tablet (0.5 mg total) by mouth daily. 90 tablet 1  . furosemide (LASIX) 20 MG tablet TAKE 1 TABLET EVERY OTHER DAY 45 tablet 3  . ipratropium (ATROVENT) 0.06 % nasal spray Place 2 sprays into both nostrils every 4 (four) hours as needed for rhinitis. 10 mL 6  . linaclotide (LINZESS) 145 MCG CAPS capsule Take 1 capsule by mouth daily as needed.    . meclizine (ANTIVERT) 25 MG tablet Take 0.5 tablets (12.5 mg total) by mouth 3 (three) times daily as needed for dizziness or nausea. 90 tablet 1  . Multiple Vitamin (MULTIVITAMIN WITH MINERALS) TABS Take 1 tablet by mouth daily.    Marland Kitchen nystatin (MYCOSTATIN) 100000 UNIT/ML suspension Take 5 mLs (500,000 Units total) by mouth 4 (four) times daily. Swish for 30 seconds and swallow. 14 days 180 mL 1  . ondansetron (ZOFRAN) 4 MG tablet Take 1 tablet (4 mg total) by mouth every 8 (eight) hours as needed for nausea or vomiting. 12 tablet 0  . oseltamivir (TAMIFLU) 75 MG capsule Take 1 capsule (75 mg total) by mouth every 12 (twelve) hours. 10 capsule 0  . polyethylene glycol (MIRALAX / GLYCOLAX) packet Take 17 g by mouth 2 (two) times daily. Until stooling regularly (Patient taking differently: Take 8 g by mouth once. Until stooling regularly) 30 packet 11  . zolpidem (AMBIEN) 10 MG tablet Take 1 tablet (10 mg total) by mouth at bedtime as needed for sleep. 90 tablet 0  . isosorbide mononitrate (IMDUR) 30 MG 24 hr tablet Take 1 tablet (30 mg total) by mouth at bedtime. 90 tablet 3  . nitroGLYCERIN (NITROSTAT) 0.4 MG SL tablet Place 1 tablet (0.4 mg total) under the tongue every 5 (five) minutes as needed for chest pain. 25 tablet 3   No current facility-administered medications for this visit.    Allergies  Allergen Reactions  . Cortisone Anaphylaxis  . Dilaudid [Hydromorphone Hcl] Nausea And Vomiting    Pt states she will start vomiting immediately for 6 hours   . Iodine Anaphylaxis  . Medrol [Methylprednisolone] Anaphylaxis  . Omnipaque [Iohexol] Anaphylaxis  . Prednisone Anaphylaxis  . Shellfish Allergy Anaphylaxis  . Sulfa Drugs Cross Reactors Anaphylaxis  . Augmentin [Amoxicillin-Pot Clavulanate]     Upset stomach  . Morphine And Related     nausea  . Codeine Rash  . Erythromycin Rash    Health Maintenance Health Maintenance  Topic Date Due  . MAMMOGRAM  11/21/2017  . TETANUS/TDAP  06/17/2020  . COLONOSCOPY  07/16/2025  . INFLUENZA VACCINE  Completed  . DEXA SCAN  Completed  . Hepatitis C Screening  Completed  . PNA vac Low Risk Adult  Completed     Exam:  BP (!) 127/50   Pulse 61   Temp 98.2 F (36.8 C) (Oral)   Wt 149 lb (67.6 kg)   BMI 25.58 kg/m  Gen: Well NAD Skin: Small 2 mm dark circular macular lesion consistent with nevus.  Shave biopsy: Consent obtained and timeout performed. Skin cleaned with alcohol and cold spray applied and 1 mL of lidocaine injected achieving good anesthesia. Skin again cleaned with alcohol and shave biopsy obtained. Dressing applied. Patient tolerated the procedure well.    No results found for this or any previous visit (from the past 72 hour(s)). No results found.    Assessment and Plan: 71 y.o. 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 Shave biopsy and  pathology pending.  Recheck as needed.  No orders of the defined types were placed in this encounter.  No orders of the defined types were placed in this encounter.    Discussed warning signs or symptoms. Please see discharge instructions. Patient expresses understanding.

## 2016-08-14 NOTE — Therapy (Signed)
[place], [place], [address] Phone: [phone]   Fax:  [phone]  Physical Therapy Treatment  Patient Details  Name: Traci [name] MRN: [id number] Date of Birth: 04-29-1946 Referring Provider: Dr. Lynne Leader   Encounter Date: 08/14/2016      PT End of Session - 08/14/16 0958    Visit Number 5   Number of Visits 12   Date for PT Re-Evaluation 09/23/16   PT Start Time 1012   PT Stop Time 1116   PT Time Calculation (min) 64 min   Activity Tolerance Patient tolerated treatment well   Behavior During Therapy [facility] for tasks assessed/performed      Past Medical History:  Diagnosis Date  . Anxiety   . Atrial fibrillation ([place])   . CAD (coronary artery disease)   . Claudication ([place]) 01/06/2008   Lower extremity dopplers - no evidence of arterial insufficiency, normal exam  . Coronary artery disease   . GERD (gastroesophageal reflux disease)   . Hyperlipidemia   . Hypertension 11/30/2010   echo- EF 55%; normal w/ mildly sclerotic aortic valve  . Hypertension 11/05/11   renal dopplers - celiac artery and SMA >50% diameter reduction, R renal artery - mildly elevated velocities 1-59% diameter reduction, L renal artery normal  . Nonspecific ST-T wave electrocardiographic changes 03/26/2011   R/Lmv - EF 74%, normal perfusion all regions, ST depression w/ Lexiscan infusion w/o assoc angina  . Osteopenia   . PAD (peripheral artery disease) ([place])   . Peripheral vascular disease ([place])   . PVD (peripheral vascular disease) ([place]) 11/05/2011   doppler - R/L brachial pressures essentially equal w/o inflow disease; L sublclavian/CCA bypass graft demonstrates patent flow, no evidence of significant stenosis  . Sigmoid diverticulitis     Past Surgical History:  Procedure Laterality Date  . ABDOMINAL AORTIC ANEURYSM REPAIR N/A 01/27/2014   Procedure: AORTO-SUPERIOR MESENTERIC ARTERY BYPASS GRAFT;   Surgeon: Angelia Mould, MD;  Location: [place];  Service: Vascular;  Laterality: N/A;  . ABDOMINAL HYSTERECTOMY    . APPENDECTOMY    . CARDIAC CATHETERIZATION  06/03/2008   60% LAD, involving D2, borderline significant by IVUS, medical therapy. CFX, RCA OK.  . CHOLECYSTECTOMY    . EYE SURGERY     retinal surgery  . SPINE SURGERY  Feb. 2011  . [place]   X's  several  . SUBCLAVIAN ARTERY STENT Left 09/20/2008   LSA ISR 7x36mm Cordis Genesis on Opta premount, reduction from 80% to 0%  . subclavian artery stents  01/23/2010   Left carotid to subclavian artery Bypass  . VISCERAL ANGIOGRAM  01/17/2014   Procedure: VISCERAL ANGIOGRAM;  Surgeon: Angelia Mould, MD;  Location: [facility] CATH LAB;  Service: Cardiovascular;;    There were no vitals filed for this visit.      Subjective Assessment - 08/14/16 1017    Subjective Pt reports she has been sore in her abdomen for a full day after doing the core work.  She made it through grocery store yesterday with no difficulty.  She feels the [place] and manual therapy helped.    Currently in Pain? Yes   Pain Score 2    Pain Location Back   Pain Orientation Left;Lower   Pain Score 3   Pain Location Abdomen   Pain Orientation --  central   Pain Descriptors / Indicators --  "pulling".    Aggravating Factors  core exercises, weight.  Pain Relieving Factors time, rest            [facility] PT Assessment - 08/14/16 0001      Assessment   Medical Diagnosis Lt lumbar radiculopathy    Referring Provider Dr. Lynne Leader    Onset Date/Surgical Date 04/17/16   Hand Dominance Right   Next MD Visit PRN          [facility] Adult PT Treatment/Exercise - 08/14/16 0001      Self-Care   Self-Care Other Self-Care Comments   Other Self-Care Comments  Pt instructed in self massage to hip/back with ball; MFR to abdomen.  Pt verbalized understanding and returned demo for low back.      Lumbar Exercises: Stretches   Passive Hamstring  Stretch 3 reps;30 seconds  supine with strap   Quad Stretch 3 reps;30 seconds  prone with strap , each side   ITB Stretch 2 reps;30 seconds  improved technique     Lumbar Exercises: Aerobic   Stationary Bike NuStep L5: 5 min      Lumbar Exercises: Supine   Bridge 5 reps  attempts with reduced TA engagement.    Bridge Limitations pt reported increased pain along abdoment; stopped.      Ultrasound   Ultrasound Location Lt QL, superior hip.    Ultrasound Parameters 100%, 1.3 w/cm2, 8 min   Ultrasound Goals Other (Comment);Pain  tissue tightness     Manual Therapy   Manual Therapy Taping;Myofascial release   Myofascial Release to abdomen along scar and at ribs   Kinesiotex Create Space     Kinesiotix   Create Space Zig zag pattern with 20% stretch applied to pt's abdomen over her incision for pain reduction/ scar management.                      PT Long Term Goals - 08/12/16 1107      PT LONG TERM GOAL #1   Title Improve posture and alignment with resolution of Rt lateral shift 09/23/16   Time 12   Period Weeks   Status Revised     PT LONG TERM GOAL #2   Title Increase Lt LE strength to 5-/5 to 5/5 as neurological symptoms allow 09/23/16   Time 12   Period Weeks   Status Revised     PT LONG TERM GOAL #3   Title Improve sitting and standing tolerance to 45-60 minutes to increase functional activity level 09/23/16   Time 12   Period Weeks   Status Revised     PT LONG TERM GOAL #4   Title Independent in HEP 09/23/16   Time 12   Period Weeks   Status Revised     PT LONG TERM GOAL #5   Title Improve FOTO to </= 34% limitation 09/23/16   Time 6   Period Weeks   Status Revised               Plan - 08/14/16 1237    Clinical Impression Statement Pt's form for ITB stretch has improved.  Encouraged pt to reduce muscle contraction with 3-part core to see if this helps reduce her spasming and pain along scar tissue.  She had positive response to massage to  Lt lower back/hip, once educated on technique as well as decreased pain in abdomen with Rock tape application. Pt reported reduction in symptoms at end of treatment.     Rehab Potential Good   PT Frequency 2x / week  PT Duration 6 weeks   PT Treatment/Interventions Patient/family education;ADLs/Self Care Home Management;Cryotherapy;Electrical Stimulation;Iontophoresis 4mg /ml Dexamethasone;Moist Heat;Traction;Ultrasound;Dry needling;Manual techniques;Therapeutic activities;Therapeutic exercise;Taping;Neuromuscular re-education   PT Next Visit Plan Assess response to tape; progress core strengthening; continue US/manual work to [employer] hip/low back.    Consulted and Agree with Plan of Care Patient      Patient will benefit from skilled therapeutic intervention in order to improve the following deficits and impairments:  Postural dysfunction, Improper body mechanics, Pain, Increased muscle spasms, Decreased range of motion, Decreased strength, Decreased activity tolerance  Visit Diagnosis: Lumbar radiculopathy  Muscle weakness (generalized)  Abnormal posture  Other symptoms and signs involving the musculoskeletal system     Problem List Patient Active Problem List   Diagnosis Date Noted  . Acute bilateral low back pain with left-sided sciatica 06/06/2016  . Osteopenia 12/13/2015  . Menopause 12/08/2015  . History of colonoscopy 07/17/2015  . Irregular heartbeat 07/26/2014  . 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 Preoperative evaluation of a medical condition to rule out surgical contraindications (TAR required) 01/17/2014  . 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[place]) 01/16/2014  . Hyperlipidemia 04/05/2013  . Pre-syncope 12/06/2012  . Hypertension 12/06/2012  . Peripheral arterial disease ([place]) 12/06/2012  . Coronary artery disease, non-occlusive, last cath 2009 12/06/2012  . Hyponatremia, improved holding diuretic 12/06/2012  . Atherosclerosis of other specified arteries 04/02/2012  .  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 Stricture of artery ([place]) 10/02/2011  . Subclavian artery stenosis, left ([facility]) 10/02/2011   Kerin Perna, PTA 08/14/16 12:48 PM  [place] [place], [place], [address] Phone: [phone]   Fax:  [phone]  Name: Traci [name] MRN: [id number] Date of Birth: February 27, 1946

## 2016-08-14 NOTE — Patient Instructions (Signed)
Thank you for coming in today.   Skin Biopsy, Care After Refer to this sheet in the next few weeks. These instructions provide you with information about caring for yourself after your procedure. Your health care provider may also give you more specific instructions. Your treatment has been planned according to current medical practices, but problems sometimes occur. Call your health care provider if you have any problems or questions after your procedure. What can I expect after the procedure? After the procedure, it is common to have:  Soreness.  Bruising.  Itching. Follow these instructions at home:  Rest and then return to your normal activities as told by your health care provider.  Take over-the-counter and prescription medicines only as told by your health care provider.  Follow instructions from your health care provider about how to take care of your biopsy site.Make sure you:  Wash your hands with soap and water before you change your bandage (dressing). If soap and water are not available, use hand sanitizer.  Change your dressing as told by your health care provider.  Leave stitches (sutures), skin glue, or adhesive strips in place. These skin closures may need to stay in place for 2 weeks or longer. If adhesive strip edges start to loosen and curl up, you may trim the loose edges. Do not remove adhesive strips completely unless your health care provider tells you to do that. If the biopsy area bleeds, apply gentle pressure for 10 minutes.  Check your biopsy site every day for signs of infection. Check for:  More redness, swelling, or pain.  More fluid or blood.  Warmth.  Pus or a bad smell.  Keep all follow-up visits as told by your health care provider. This is important. Contact a health care provider if:  You have more redness, swelling, or pain around your biopsy site.  You have more fluid or blood coming from your biopsy site.  Your biopsy site feels  warm to the touch.  You have pus or a bad smell coming from your biopsy site.  You have a fever. Get help right away if:  You have bleeding that does not stop with pressure or a dressing. This information is not intended to replace advice given to you by your health care provider. Make sure you discuss any questions you have with your health care provider. Document Released: 06/30/2015 Document Revised: 01/28/2016 Document Reviewed: 08/31/2014 Elsevier Interactive Patient Education  2017 [employer].

## 2016-08-19 ENCOUNTER — Encounter: Payer: Medicare Other | Admitting: Physical Therapy

## 2016-08-20 ENCOUNTER — Ambulatory Visit (INDEPENDENT_AMBULATORY_CARE_PROVIDER_SITE_OTHER): Payer: Medicare Other | Admitting: Physical Therapy

## 2016-08-20 DIAGNOSIS — M5416 Radiculopathy, lumbar region: Secondary | ICD-10-CM

## 2016-08-20 DIAGNOSIS — M6281 Muscle weakness (generalized): Secondary | ICD-10-CM

## 2016-08-20 DIAGNOSIS — R293 Abnormal posture: Secondary | ICD-10-CM

## 2016-08-20 NOTE — Therapy (Addendum)
[place], [place], [address] Phone: [phone]   Fax:  [phone]  Physical Therapy Treatment  Patient Details  Name: Traci [name] MRN: [id number] Date of Birth: 1946/04/09 Referring Provider: Dr. Lynne Leader  Encounter Date: 08/20/2016      PT End of Session - 08/20/16 0935    Visit Number 6   Number of Visits 12   Date for PT Re-Evaluation 09/23/16   PT Start Time 0931   PT Stop Time 1034   PT Time Calculation (min) 63 min   Activity Tolerance Patient tolerated treatment well   Behavior During Therapy [facility] for tasks assessed/performed      Past Medical History:  Diagnosis Date  . Anxiety   . Atrial fibrillation ([place])   . CAD (coronary artery disease)   . Claudication ([place]) 01/06/2008   Lower extremity dopplers - no evidence of arterial insufficiency, normal exam  . Coronary artery disease   . GERD (gastroesophageal reflux disease)   . Hyperlipidemia   . Hypertension 11/30/2010   echo- EF 55%; normal w/ mildly sclerotic aortic valve  . Hypertension 11/05/11   renal dopplers - celiac artery and SMA >50% diameter reduction, R renal artery - mildly elevated velocities 1-59% diameter reduction, L renal artery normal  . Nonspecific ST-T wave electrocardiographic changes 03/26/2011   R/Lmv - EF 74%, normal perfusion all regions, ST depression w/ Lexiscan infusion w/o assoc angina  . Osteopenia   . PAD (peripheral artery disease) ([place])   . Peripheral vascular disease ([place])   . PVD (peripheral vascular disease) ([place]) 11/05/2011   doppler - R/L brachial pressures essentially equal w/o inflow disease; L sublclavian/CCA bypass graft demonstrates patent flow, no evidence of significant stenosis  . Sigmoid diverticulitis     Past Surgical History:  Procedure Laterality Date  . ABDOMINAL AORTIC ANEURYSM REPAIR N/A 01/27/2014   Procedure: AORTO-SUPERIOR MESENTERIC ARTERY BYPASS GRAFT;   Surgeon: Angelia Mould, MD;  Location: [facility];  Service: Vascular;  Laterality: N/A;  . ABDOMINAL HYSTERECTOMY    . APPENDECTOMY    . CARDIAC CATHETERIZATION  06/03/2008   60% LAD, involving D2, borderline significant by IVUS, medical therapy. CFX, RCA OK.  . CHOLECYSTECTOMY    . EYE SURGERY     retinal surgery  . SPINE SURGERY  Feb. 2011  . [place]   X's  several  . SUBCLAVIAN ARTERY STENT Left 09/20/2008   LSA ISR 7x52mm Cordis Genesis on Opta premount, reduction from 80% to 0%  . subclavian artery stents  01/23/2010   Left carotid to subclavian artery Bypass  . VISCERAL ANGIOGRAM  01/17/2014   Procedure: VISCERAL ANGIOGRAM;  Surgeon: Angelia Mould, MD;  Location: [facility] CATH LAB;  Service: Cardiovascular;;    There were no vitals filed for this visit.      Subjective Assessment - 08/20/16 0935    Subjective Pt reports she moved some furniture that was delivered and her back is a little more sore today.  She liked the way the tape felt on abdomen; requests to learn how to self-apply.  She is having mole removed from lower back that was spotted during last visit.    Pertinent History See history in record; subclavial by pass Lt; SMA bypass 2015(messenteric artery bypass)   Currently in Pain? Yes   Pain Score 5    Pain Location Back   Pain Orientation Left;Lower   Pain Descriptors / Indicators Aching  Aggravating Factors  ?   Pain Relieving Factors heat            [facility] PT Assessment - 08/20/16 0001      Assessment   Medical Diagnosis Lt lumbar radiculopathy    Referring Provider Dr. Ellard Artis corey   Onset Date/Surgical Date 04/17/16   Hand Dominance Right   Next MD Visit PRN                     [facility] Adult PT Treatment/Exercise - 08/20/16 0001      Self-Care   Other Self-Care Comments  Pt instructed in [place] tape application and removal. Pt verbalized understanding.      Lumbar Exercises: Stretches   Passive Hamstring Stretch  3 reps;30 seconds  supine with strap   ITB Stretch 2 reps;60 seconds   Piriformis Stretch 30 seconds;3 reps     Lumbar Exercises: Aerobic   Stationary Bike NuStep L4: 5 min      Ultrasound   Ultrasound Location Lt glute med/SI   Ultrasound Parameters 100%, 1.3 w/cm2, 8 min    Ultrasound Goals Other (Comment);Pain  tissue tightness     Manual Therapy   Manual Therapy Taping;Myofascial release     [employer]   Create Space Zig zag pattern with 20% stretch applied to pt's abdomen over her incision for pain reduction/ scar management.      Modalities:   estim and MHP to Lt hip/ low back in sidelying for pain x 20 min.         PT Long Term Goals - 08/12/16 1107      PT LONG TERM GOAL #1   Title Improve posture and alignment with resolution of Rt lateral shift 09/23/16   Time 12   Period Weeks   Status Revised     PT LONG TERM GOAL #2   Title Increase Lt LE strength to 5-/5 to 5/5 as neurological symptoms allow 09/23/16   Time 12   Period Weeks   Status Revised     PT LONG TERM GOAL #3   Title Improve sitting and standing tolerance to 45-60 minutes to increase functional activity level 09/23/16   Time 12   Period Weeks   Status Revised     PT LONG TERM GOAL #4   Title Independent in HEP 09/23/16   Time 12   Period Weeks   Status Revised     PT LONG TERM GOAL #5   Title Improve FOTO to </= 34% limitation 09/23/16   Time 6   Period Weeks   Status Revised               Plan - 08/20/16 1005    Clinical Impression Statement Pt had positive response to Rock tape application; less pain in abdomen today.  She verbalized understanding of tape application with education. Pt reported reduction of pain at end of session.  Pt making gradual gains towards established goals.    Rehab Potential Good   PT Frequency 2x / week   PT Duration 6 weeks   PT Treatment/Interventions Patient/family education;ADLs/Self Care Home Management;Cryotherapy;Electrical  Stimulation;Iontophoresis 4mg /ml Dexamethasone;Moist Heat;Traction;Ultrasound;Dry needling;Manual techniques;Therapeutic activities;Therapeutic exercise;Taping;Neuromuscular re-education   PT Next Visit Plan  progress core strengthening; continue US/manual work to Lt hip/low back.    Consulted and Agree with Plan of Care Patient      Patient will benefit from skilled therapeutic intervention in order to improve the following deficits and impairments:  Postural dysfunction, Improper body mechanics,  Pain, Increased muscle spasms, Decreased range of motion, Decreased strength, Decreased activity tolerance  Visit Diagnosis: Lumbar radiculopathy  Muscle weakness (generalized)  Abnormal posture     Problem List Patient Active Problem List   Diagnosis Date Noted  . Acute bilateral low back pain with left-sided sciatica 06/06/2016  . Osteopenia 12/13/2015  . Menopause 12/08/2015  . History of colonoscopy 07/17/2015  . Irregular heartbeat 07/26/2014  . 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 Preoperative evaluation of a medical condition to rule out surgical contraindications (TAR required) 01/17/2014  . 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[place]) 01/16/2014  . Hyperlipidemia 04/05/2013  . Pre-syncope 12/06/2012  . Hypertension 12/06/2012  . Peripheral arterial disease ([place]) 12/06/2012  . Coronary artery disease, non-occlusive, last cath 2009 12/06/2012  . Hyponatremia, improved holding diuretic 12/06/2012  . Atherosclerosis of other specified arteries 04/02/2012  . 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 Stricture of artery ([place]) 10/02/2011  . Subclavian artery stenosis, left ([place]) 10/02/2011   Kerin Perna, PTA 08/20/16 1:29 PM  [place], [place], [address] Phone: [phone]   Fax:  [phone]  Name: Traci [name] MRN: [id number] Date of Birth: 05-13-1946

## 2016-08-21 ENCOUNTER — Ambulatory Visit (INDEPENDENT_AMBULATORY_CARE_PROVIDER_SITE_OTHER): Payer: Medicare Other | Admitting: Physical Therapy

## 2016-08-21 DIAGNOSIS — M6281 Muscle weakness (generalized): Secondary | ICD-10-CM

## 2016-08-21 DIAGNOSIS — R293 Abnormal posture: Secondary | ICD-10-CM

## 2016-08-21 DIAGNOSIS — M5416 Radiculopathy, lumbar region: Secondary | ICD-10-CM | POA: Diagnosis not present

## 2016-08-21 NOTE — Therapy (Signed)
[place], [place], [address] Phone: [phone]   Fax:  [phone]  Physical Therapy Treatment  Patient Details  Name: Traci [name] MRN: [id number] Date of Birth: [birth year] Referring Provider: Dr. Lynne Leader  Encounter Date: 08/21/2016      PT End of Session - 08/21/16 1017    Visit Number 7   Number of Visits 12   Date for PT Re-Evaluation 09/23/16   PT Start Time 0923   PT Stop Time 1113   PT Time Calculation (min) 58 min   Activity Tolerance Patient tolerated treatment well   Behavior During Therapy [facility] for tasks assessed/performed      Past Medical History:  Diagnosis Date  . Anxiety   . Atrial fibrillation ([place])   . CAD (coronary artery disease)   . Claudication ([place]) 01/06/2008   Lower extremity dopplers - no evidence of arterial insufficiency, normal exam  . Coronary artery disease   . GERD (gastroesophageal reflux disease)   . Hyperlipidemia   . Hypertension 11/30/2010   echo- EF 55%; normal w/ mildly sclerotic aortic valve  . Hypertension 11/05/11   renal dopplers - celiac artery and SMA >50% diameter reduction, R renal artery - mildly elevated velocities 1-59% diameter reduction, L renal artery normal  . Nonspecific ST-T wave electrocardiographic changes 03/26/2011   R/Lmv - EF 74%, normal perfusion all regions, ST depression w/ Lexiscan infusion w/o assoc angina  . Osteopenia   . PAD (peripheral artery disease) ([place])   . Peripheral vascular disease ([place])   . PVD (peripheral vascular disease) ([place]) 11/05/2011   doppler - R/L brachial pressures essentially equal w/o inflow disease; L sublclavian/CCA bypass graft demonstrates patent flow, no evidence of significant stenosis  . Sigmoid diverticulitis     Past Surgical History:  Procedure Laterality Date  . ABDOMINAL AORTIC ANEURYSM REPAIR N/A 01/27/2014   Procedure: AORTO-SUPERIOR MESENTERIC ARTERY BYPASS GRAFT;   Surgeon: Angelia Mould, MD;  Location: [place];  Service: Vascular;  Laterality: N/A;  . ABDOMINAL HYSTERECTOMY    . APPENDECTOMY    . CARDIAC CATHETERIZATION  06/03/2008   60% LAD, involving D2, borderline significant by IVUS, medical therapy. CFX, RCA OK.  . CHOLECYSTECTOMY    . EYE SURGERY     retinal surgery  . SPINE SURGERY  Feb. 2011  . [place]   X's  several  . SUBCLAVIAN ARTERY STENT Left 09/20/2008   LSA ISR 7x71m Cordis Genesis on Opta premount, reduction from 80% to 0%  . subclavian artery stents  01/23/2010   Left carotid to subclavian artery Bypass  . VISCERAL ANGIOGRAM  01/17/2014   Procedure: VISCERAL ANGIOGRAM;  Surgeon: CAngelia Mould MD;  [facility];  Service: Cardiovascular;;    There were no vitals filed for this visit.      Subjective Assessment - 08/21/16 1018    Subjective Pt reports moving furniture again yesturday as well as lifting groceries; pain is now more in center of lower back. Did not notice much difference on Lt glute med with rock tape; Rock tape on her abdomen has been beneficial.    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 no limit    How long can you walk comfortably? 20-30 min    Patient Stated Goals get pain better so she can move more    Currently in  Pain? Yes   Pain Score 5    Pain Location Back   Pain Orientation Left;Right;Lower   Pain Descriptors / Indicators Nagging;Aching   Pain Radiating Towards into Lt Hip    Pain Onset Today   Aggravating Factors  Lifting; moving furniture; sitting for long periods of time   Pain Relieving Factors lumbar pillow help eliviate pain    Multiple Pain Sites No            [facility] PT Assessment - 08/21/16 0001      Assessment   Medical Diagnosis Lt lumbar radiculopathy    Referring Provider Dr. Lynne Leader   Onset Date/Surgical Date 04/17/16   Hand  Dominance Right   Next MD Visit PRN     Strength   Right/Left Hip Left   Left Hip Flexion --  5-/5   Left Hip Extension 4+/5  with back pain   Left Hip ABduction --  5-/5          [facility] Adult PT Treatment/Exercise - 08/21/16 0001      Lumbar Exercises: Stretches   Passive Hamstring Stretch 3 reps;30 seconds  supine with strap   ITB Stretch 2 reps;60 seconds   Piriformis Stretch 30 seconds;3 reps     Lumbar Exercises: Aerobic   Stationary Bike NuStep L5: 5 min      Lumbar Exercises: Supine   Clam 10 reps  with TA engaged.    Bent Knee Raise 10 reps  with TA engaged.      Moist Heat Therapy   Number Minutes Moist Heat 15 Minutes   Moist Heat Location Lumbar Spine     Electrical Stimulation   Electrical Stimulation Location bilat lumbar paraspinals/ glutes   Electrical Stimulation Action IFC   Electrical Stimulation Parameters  to tolerance   Electrical Stimulation Goals Pain     Manual Therapy   Soft tissue mobilization STM to Lt glutes, lumbar paraspinals/QL, piriformis   Myofascial Release to bilat lumbar paraspinals.                      PT Long Term Goals - 08/21/16 1058      PT LONG TERM GOAL #1   Title Improve posture and alignment with resolution of Rt lateral shift 09/23/16   Time 12   Period Weeks   Status On-going     PT LONG TERM GOAL #2   Title Increase Lt LE strength to 5-/5 to 5/5 as neurological symptoms allow 09/23/16   Time 12   Period Weeks   Status Partially Met     PT LONG TERM GOAL #3   Title Improve sitting and standing tolerance to 45-60 minutes to increase functional activity level 09/23/16   Time 12   Period Weeks   Status Achieved  walk >60 min, sit at least 45 with lumbar support     PT LONG TERM GOAL #4   Title Independent in HEP 09/23/16   Time 12   Period Weeks   Status On-going     PT LONG TERM GOAL #5   Title Improve FOTO to </= 34% limitation 09/23/16   Time 6   Period Weeks   Status On-going                Plan - 08/21/16 1152    Clinical Impression Statement Pt reported minimal relief with use of Rock tape to Lt hip.  Today, pain was more centralized, likely agrivated due to her lifting/pushing activities  outside of therapy.  She demonstrated improved LE strength; has partially met LTG #2.  Her sitting and standing tolerance has also improved; has achieved LTG #3.     Rehab Potential Good   PT Frequency 2x / week   PT Duration 6 weeks   PT Treatment/Interventions Patient/family education;ADLs/Self Care Home Management;Cryotherapy;Electrical Stimulation;Iontophoresis '4mg'$ /ml Dexamethasone;Moist Heat;Traction;Ultrasound;Dry needling;Manual techniques;Therapeutic activities;Therapeutic exercise;Taping;Neuromuscular re-education   PT Next Visit Plan  progress core strengthening; continue US/manual work to Lt hip/low back.    Consulted and Agree with Plan of Care Patient      Patient will benefit from skilled therapeutic intervention in order to improve the following deficits and impairments:  Postural dysfunction, Improper body mechanics, Pain, Increased muscle spasms, Decreased range of motion, Decreased strength, Decreased activity tolerance  Visit Diagnosis: Lumbar radiculopathy  Muscle weakness (generalized)  Abnormal posture     Problem List Patient Active Problem List   Diagnosis Date Noted  . Acute bilateral low back pain with left-sided sciatica 06/06/2016  . Osteopenia 12/13/2015  . Menopause 12/08/2015  . History of colonoscopy 07/17/2015  . Irregular heartbeat 07/26/2014  . 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 Preoperative evaluation of a medical condition to rule out surgical contraindications (TAR required) 01/17/2014  . 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[place]) 01/16/2014  . Hyperlipidemia 04/05/2013  . Pre-syncope 12/06/2012  . Hypertension 12/06/2012  . Peripheral arterial disease ([place]) 12/06/2012  . Coronary artery disease, non-occlusive, last cath 2009  12/06/2012  . Hyponatremia, improved holding diuretic 12/06/2012  . Atherosclerosis of other specified arteries 04/02/2012  . 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 Stricture of artery ([place]) 10/02/2011  . Subclavian artery stenosis, left ([place]) 10/02/2011   Kerin Perna, PTA 08/21/16 11:57 AM  [place], [place], [address] Phone: [phone]   Fax:  [phone]  Name: Traci [name] MRN: [id number] Date of Birth: January 01, 1946

## 2016-08-22 ENCOUNTER — Encounter: Payer: Self-pay | Admitting: *Deleted

## 2016-08-22 ENCOUNTER — Ambulatory Visit (INDEPENDENT_AMBULATORY_CARE_PROVIDER_SITE_OTHER): Payer: Medicare Other | Admitting: Family Medicine

## 2016-08-22 ENCOUNTER — Ambulatory Visit: Payer: Medicare Other | Admitting: *Deleted

## 2016-08-22 VITALS — BP 118/64 | HR 59 | Temp 97.7°F | Ht 64.0 in | Wt 151.5 lb

## 2016-08-22 DIAGNOSIS — D229 Melanocytic nevi, unspecified: Secondary | ICD-10-CM | POA: Insufficient documentation

## 2016-08-22 DIAGNOSIS — D225 Melanocytic nevi of trunk: Secondary | ICD-10-CM | POA: Diagnosis not present

## 2016-08-22 DIAGNOSIS — Z0000 Encounter for general adult medical examination without abnormal findings: Secondary | ICD-10-CM

## 2016-08-22 DIAGNOSIS — L988 Other specified disorders of the skin and subcutaneous tissue: Secondary | ICD-10-CM | POA: Diagnosis not present

## 2016-08-22 DIAGNOSIS — Z6826 Body mass index (BMI) 26.0-26.9, adult: Secondary | ICD-10-CM

## 2016-08-22 NOTE — Patient Instructions (Signed)
Thank you for coming in today. Return in 1 week for suture removal.  Return sooner if needed.  I will let you know results ASAP.    Sutured Wound Care Sutures are stitches that can be used to close wounds. Taking care of your wound properly can help prevent pain and infection. It can also help your wound to heal more quickly. How is this treated? Wound Care   Keep the wound clean and dry.  If you were given a bandage (dressing), change it at least one time per day or as told by your doctor. You should also change it if it gets wet or dirty.  Keep the wound completely dry for the first 24 hours or as told by your doctor. After that time, you may shower or bathe. However, make sure that the wound is not soaked in water until the sutures have been removed.  Clean the wound one time each day or as told by your doctor.  Wash the wound with soap and water.  Rinse the wound with water to remove all soap.  Pat the wound dry with a clean towel. Do not rub the wound.  After cleaning the wound, put a thin layer of antibiotic ointment on it as told your doctor. This ointment:  Helps to prevent infection.  Keeps the bandage from sticking to the wound.  Have the sutures removed as told by your doctor. General Instructions   Take or apply medicines only as told by your doctor.  To help prevent scarring, make sure to cover your wound with sunscreen whenever you are outside after the sutures are removed and the wound is healed. Make sure to wear a sunscreen of at least 30 SPF.  If you were prescribed an antibiotic medicine or ointment, finish all of it even if you start to feel better.  Do not scratch or pick at the wound.  Keep all follow-up visits as told by your doctor. This is important.  Check your wound every day for signs of infection. Watch for:  Redness, swelling, or pain.  Fluid, blood, or pus.  Raise (elevate) the injured area above the level of your heart while you are  sitting or lying down, if possible.  Avoid stretching your wound.  Drink enough fluids to keep your pee (urine) clear or pale yellow. Contact a doctor if:  You were given a tetanus shot and you have any of these where the needle went in:  Swelling.  Very bad pain.  Redness.  Bleeding.  You have a fever.  A wound that was closed breaks open.  You notice a bad smell coming from the wound.  You notice something coming out of the wound, such as wood or glass.  Medicine does not help your pain.  You have any of these at the site of the wound.  More redness.  More swelling.  More pain.  You have any of these coming from the wound.  Fluid.  Blood.  Pus.  You notice a change in the color of your skin near the wound.  You need to change the bandage often due to fluid, blood, or pus coming from the wound.  You have a new rash.  You have numbness around the wound. Get help right away if:  You have very bad swelling around the wound.  Your pain suddenly gets worse and is very bad.  You have painful lumps near the wound or on skin that is anywhere on your body.  You have a red streak going away from the wound.  The wound is on your hand or foot and you cannot move a finger or toe like normal.  The wound is on your hand or foot and you notice that your fingers or toes look pale or bluish. This information is not intended to replace advice given to you by your health care provider. Make sure you discuss any questions you have with your health care provider. Document Released: 11/20/2007 Document Revised: 11/09/2015 Document Reviewed: 01/13/2013 Elsevier Interactive Patient Education  2017 [employer].

## 2016-08-22 NOTE — Progress Notes (Signed)
Patient presents to clinic today for previously arranged excisional biopsy of previous shave biopsy atypical nevus on her right low back.  Excisional biopsy note: Consent obtained and timeout performed. Area cleaned with alcohol cold spray applied and 3 mL of lidocaine injected achieving good anesthesia. Skin sterilized with chlorhexidine. The planned incision area was marked with a sterile marker pen. The area was draped in the usual sterile fashion. Sharp incision was made an elliptical fashion Giving 2 mm margins along the previous nevus site along the skin tension lines. The incision was undermined and the lesion was removed. 1 horizontal mattress suture and 2 simple interrupted sutures were used to close the wound and achieve excellent hemostasis.  4-0 Prolene was used Minimal blood loss.  Patient tolerated the procedure well. Recheck in 1 week for suture removal.

## 2016-08-22 NOTE — Patient Instructions (Addendum)
Health maintenance:You currently have no gaps in care.  Abnormal screenings: None today.  Patient concerns: None today.  Nurse concerns:I have not identified any concerns today.  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   Next PCP appt: Your next Annual Wellness Visit with the nurse health advisor will be in one year.  Preventive Care 72 Years and Older, Female Preventive care refers to lifestyle choices and visits with your health care provider that can promote health and wellness. What does preventive care include?  A yearly physical exam. This is also called an annual well check.  Dental exams once or twice a year.  Routine eye exams. Ask your health care provider how often you should have your eyes checked.  Personal lifestyle choices, including:  Daily care of your teeth and gums.  Regular physical activity.  Eating a healthy diet.  Avoiding tobacco and drug use.  Limiting alcohol use.  Practicing safe sex.  Taking low-dose aspirin every day.  Taking vitamin and mineral supplements as recommended by your health care provider. What happens during an annual well check? The services and screenings done by your health care provider during your annual well check will depend on your age, overall health, lifestyle risk factors, and family history of disease. Counseling  Your health care provider may ask you questions about your:  Alcohol use.  Tobacco use.  Drug use.  Emotional well-being.  Home and relationship well-being.  Sexual activity.  Eating habits.  History of falls.  Memory and ability to understand (cognition).  Work and work Statistician.  Reproductive health. Screening  You may have the following tests or measurements:  Height, weight, and BMI.  Blood pressure.  Lipid and cholesterol levels. These may be checked every 5 years, or more frequently if  you are over 38 years old.  Skin check.  Lung cancer screening. You may have this screening every year starting at age 42 if you have a 30-pack-year history of smoking and currently smoke or have quit within the past 15 years.  Fecal occult blood test (FOBT) of the stool. You may have this test every year starting at age 40.  Flexible sigmoidoscopy or colonoscopy. You may have a sigmoidoscopy every 5 years or a colonoscopy every 10 years starting at age 76.  Hepatitis C blood test.  Hepatitis B blood test.  Sexually transmitted disease (STD) testing.  Diabetes screening. This is done by checking your blood sugar (glucose) after you have not eaten for a while (fasting). You may have this done every 1-3 years.  Bone density scan. This is done to screen for osteoporosis. You may have this done starting at age 31.  Mammogram. This may be done every 1-2 years. Talk to your health care provider about how often you should have regular mammograms. Talk with your health care provider about your test results, treatment options, and if necessary, the need for more tests. Vaccines  Your health care provider may recommend certain vaccines, such as:  Influenza vaccine. This is recommended every year.  Tetanus, diphtheria, and acellular pertussis (Tdap, Td) vaccine. You may need a Td booster every 10 years.  Varicella vaccine. You may need this if you have not been vaccinated.  Zoster vaccine. You may need this after age 63.  Measles, mumps, and rubella (MMR) vaccine. You may need at least one dose of MMR if you were born in  1956 or later. You may also need a second dose.  Pneumococcal 13-valent conjugate (PCV13) vaccine. One dose is recommended after age 17.  Pneumococcal polysaccharide (PPSV23) vaccine. One dose is recommended after age 89.  Meningococcal vaccine. You may need this if you have certain conditions.  Hepatitis A vaccine. You may need this if you have certain conditions or if  you travel or work in places where you may be exposed to hepatitis A.  Hepatitis B vaccine. You may need this if you have certain conditions or if you travel or work in places where you may be exposed to hepatitis B.  Haemophilus influenzae type b (Hib) vaccine. You may need this if you have certain conditions. Talk to your health care provider about which screenings and vaccines you need and how often you need them. This information is not intended to replace advice given to you by your health care provider. Make sure you discuss any questions you have with your health care provider. Document Released: 06/30/2015 Document Revised: 02/21/2016 Document Reviewed: 04/04/2015 Elsevier Interactive Patient Education  2017 [employer].

## 2016-08-22 NOTE — Progress Notes (Unsigned)
Subjective:   Traci [name] is a 71 y.o. female who presents for Medicare Annual (Subsequent) preventive examination. Review of Systems:  Cardiac Risk Factors include: advanced age (>87men, >12 women);dyslipidemia;family history of premature cardiovascular disease;hypertension    Objective:    Vitals: BP 118/64   Pulse (!) 59   Temp 97.7 F (36.5 C) (Oral)   Ht 5\' 4"  (1.626 m)   Wt 151 lb 8 oz (68.7 kg)   SpO2 97%   BMI 26.00 kg/m   Body mass index is 26 kg/m.  Tobacco History  Smoking Status  . Former Smoker  . Packs/day: 0.25  . Years: 20.00  . Types: Cigarettes  . Quit date: 09/30/1993  Smokeless Tobacco  . Never Used     Counseling given: No  Past Medical History:  Diagnosis Date  . Anxiety   . Atrial fibrillation ([place])   . CAD (coronary artery disease)   . Claudication ([place]) 01/06/2008   Lower extremity dopplers - no evidence of arterial insufficiency, normal exam  . Coronary artery disease   . GERD (gastroesophageal reflux disease)   . Hyperlipidemia   . Hypertension 11/30/2010   echo- EF 55%; normal w/ mildly sclerotic aortic valve  . Hypertension 11/05/11   renal dopplers - celiac artery and SMA >50% diameter reduction, R renal artery - mildly elevated velocities 1-59% diameter reduction, L renal artery normal  . Nonspecific ST-T wave electrocardiographic changes 03/26/2011   R/Lmv - EF 74%, normal perfusion all regions, ST depression w/ Lexiscan infusion w/o assoc angina  . Osteopenia   . PAD (peripheral artery disease) ([place])   . Peripheral vascular disease ([place])   . PVD (peripheral vascular disease) ([place]) 11/05/2011   doppler - R/L brachial pressures essentially equal w/o inflow disease; L sublclavian/CCA bypass graft demonstrates patent flow, no evidence of significant stenosis  . Sigmoid diverticulitis    Past Surgical History:  Procedure Laterality Date  . ABDOMINAL AORTIC ANEURYSM REPAIR N/A 01/27/2014   Procedure: AORTO-SUPERIOR  MESENTERIC ARTERY BYPASS GRAFT;  Surgeon: Angelia Mould, MD;  Location: [facility];  Service: Vascular;  Laterality: N/A;  . ABDOMINAL HYSTERECTOMY    . APPENDECTOMY    . CARDIAC CATHETERIZATION  06/03/2008   60% LAD, involving D2, borderline significant by IVUS, medical therapy. CFX, RCA OK.  . CHOLECYSTECTOMY    . EYE SURGERY     retinal surgery  . SPINE SURGERY  Feb. 2011  . [place]   X's  several  . SUBCLAVIAN ARTERY STENT Left 09/20/2008   LSA ISR 7x27mm Cordis Genesis on Opta premount, reduction from 80% to 0%  . subclavian artery stents  01/23/2010   Left carotid to subclavian artery Bypass  . VISCERAL ANGIOGRAM  01/17/2014   Procedure: VISCERAL ANGIOGRAM;  Surgeon: Angelia Mould, MD;  Location: [facility] CATH LAB;  Service: Cardiovascular;;   Family History  Problem Relation Age of Onset  . Heart disease Father 28    Heart Disease before age 42  . Kidney disease Father   . Heart attack Father   . Hyperlipidemia Father   . Hypertension Father   . Kidney failure Mother   . Diabetes Maternal Grandmother   . Heart disease Paternal Grandfather    History  Sexual Activity  . Sexual activity: No   Outpatient Encounter Prescriptions as of 08/22/2016  Medication Sig  . aspirin EC 81 MG tablet Take 81 mg by mouth daily.  . ATACAND 16 MG tablet TAKE ONE-HALF (1/2)  TABLET EVERY MORNING  . atenolol (TENORMIN) 25 MG tablet Take 1 tablet (25 mg total) by mouth daily.  Marland Kitchen atorvastatin (LIPITOR) 10 MG tablet TAKE 1 TABLET EVERY EVENING  . cholecalciferol (VITAMIN D) 1000 UNITS tablet Take 1,000 Units by mouth daily.  . clopidogrel (PLAVIX) 75 MG tablet TAKE 1 TABLET DAILY  . clorazepate (TRANXENE) 7.5 MG tablet Take 1 tablet (7.5 mg total) by mouth daily as needed.  . cyanocobalamin 1000 MCG tablet Take 100 mcg by mouth daily.  Marland Kitchen EPINEPHrine (EPIPEN 2-PAK) 0.3 mg/0.3 mL IJ SOAJ injection Inject 0.3 mLs (0.3 mg total) into the muscle as needed (for allergic  reaction).  Marland Kitchen esomeprazole (NEXIUM) 40 MG capsule Take 1 capsule (40 mg total) by mouth daily. Take 1 tab daily  . estradiol (ESTRACE) 0.5 MG tablet Take 1 tablet (0.5 mg total) by mouth daily.  . furosemide (LASIX) 20 MG tablet TAKE 1 TABLET EVERY OTHER DAY  . linaclotide (LINZESS) 145 MCG CAPS capsule Take 1 capsule by mouth daily as needed.  . meclizine (ANTIVERT) 25 MG tablet Take 0.5 tablets (12.5 mg total) by mouth 3 (three) times daily as needed for dizziness or nausea.  . Multiple Vitamin (MULTIVITAMIN WITH MINERALS) TABS Take 1 tablet by mouth daily.  . polyethylene glycol (MIRALAX / GLYCOLAX) packet Take 17 g by mouth 2 (two) times daily. Until stooling regularly (Patient taking differently: Take 8 g by mouth once. Until stooling regularly)  . zolpidem (AMBIEN) 10 MG tablet Take 1 tablet (10 mg total) by mouth at bedtime as needed for sleep.  Marland Kitchen ipratropium (ATROVENT) 0.06 % nasal spray Place 2 sprays into both nostrils every 4 (four) hours as needed for rhinitis. (Patient not taking: Reported on 08/22/2016)  . nitroGLYCERIN (NITROSTAT) 0.4 MG SL tablet Place 1 tablet (0.4 mg total) under the tongue every 5 (five) minutes as needed for chest pain.  Marland Kitchen nystatin (MYCOSTATIN) 100000 UNIT/ML suspension Take 5 mLs (500,000 Units total) by mouth 4 (four) times daily. Swish for 30 seconds and swallow. 14 days (Patient not taking: Reported on 08/22/2016)  . ondansetron (ZOFRAN) 4 MG tablet Take 1 tablet (4 mg total) by mouth every 8 (eight) hours as needed for nausea or vomiting. (Patient not taking: Reported on 08/22/2016)  . oseltamivir (TAMIFLU) 75 MG capsule Take 1 capsule (75 mg total) by mouth every 12 (twelve) hours. (Patient not taking: Reported on 08/22/2016)  . [DISCONTINUED] isosorbide mononitrate (IMDUR) 30 MG 24 hr tablet Take 1 tablet (30 mg total) by mouth at bedtime.   No facility-administered encounter medications on file as of 08/22/2016.    Activities of Daily Living In your present  state of health, do you have any difficulty performing the following activities: 08/22/2016  Hearing? N  Vision? N  Difficulty concentrating or making decisions? N  Walking or climbing stairs? N  Dressing or bathing? N  Doing errands, shopping? N  Preparing Food and eating ? N  Using the Toilet? N  In the past six months, have you accidently leaked urine? Y  Do you have problems with loss of bowel control? N  Managing your Medications? N  Managing your Finances? N  Housekeeping or managing your Housekeeping? N  Some recent data might be hidden    Patient Care Team: Gregor Hams, MD as PCP - General (Family Medicine) Erline Levine, MD as Consulting Physician (Neurosurgery) Sanda Klein, MD as Consulting Physician (Cardiology) Janie Morning, MD (Gastroenterology)    Assessment:    Exercise Activities  and Dietary recommendations Current Exercise Habits: Home exercise routine, Type of exercise: walking;stretching;Other - see comments (dances--shagging.), Time (Minutes): 60, Frequency (Times/Week): 5, Weekly Exercise (Minutes/Week): 300, Intensity: Moderate, Exercise limited by: cardiac condition(s)  Goals      Patient Stated   . Weight (lb) < 200 lb (90.7 kg) (pt-stated)          She would like to lose 5 pounds.      Fall Risk Fall Risk  08/22/2016 05/24/2016 09/23/2014  Falls in the past year? No No No   Depression Screen PHQ 2/9 Scores 08/22/2016 09/23/2014  PHQ - 2 Score 0 0     Cognitive Function-Normal cognitive function per 6CIT score of 0 today in office and conversation with her directly.     6CIT Screen 08/22/2016  What Year? 0 points  What month? 0 points  What time? 0 points  Count back from 20 0 points  Months in reverse 0 points  Repeat phrase 0 points  Total Score 0   Immunization History  Administered Date(s) Administered  . Influenza-Unspecified 03/02/2015, 03/15/2016  . Pneumococcal Conjugate-13 08/30/2014  . Pneumococcal Polysaccharide-23  06/17/2012  . Tdap 06/17/2010  . Zoster 06/17/2012   Screening Tests Health Maintenance  Topic Date Due  . MAMMOGRAM  11/21/2017  . TETANUS/TDAP  06/17/2020  . COLONOSCOPY  07/16/2025  . INFLUENZA VACCINE  Completed  . DEXA SCAN  Completed  . Hepatitis C Screening  Completed  . PNA vac Low Risk Adult  Completed     Plan:   I have personally reviewed and addressed the Medicare Annual Wellness questionnaire and have noted the following in the patient's chart:  A. Medical and social history-Discussed. B. Use of alcohol, tobacco or illicit drugs-Discussed. C. Current medications and supplements-Discussed. D. Functional ability and status-Discussed.  Very independent and active. E.  Nutritional status-Discussed.  BMI reading today in overweight category @ 26.0.  Goal is to lose 5 pounds. F.  Physical activity-discussed.  She is very active with walking, dancing, and stretching. G. Advance directives-Discussed. H. List of other physicians I.  Hospitalizations, surgeries, and ER visits in previous 12 months J.  [place] to include cognitive, depression-Normal cognitive function per 6CIT score today of 0 and per conversation with her directly. L. Referrals and appointments -None made today and there are no gaps in her care or overdue health maintenance.  In addition, I have reviewed and discussed with patient certain preventive protocols, quality metrics, and best practice recommendations. A written personalized care plan for preventive services as well as general preventive health recommendations were provided to patient.  Signed,   Nestor Lewandowsky, RN

## 2016-08-23 ENCOUNTER — Encounter: Payer: Self-pay | Admitting: Vascular Surgery

## 2016-08-27 ENCOUNTER — Encounter: Payer: Medicare Other | Admitting: Physical Therapy

## 2016-08-29 ENCOUNTER — Encounter (HOSPITAL_COMMUNITY): Payer: Self-pay | Admitting: Emergency Medicine

## 2016-08-29 ENCOUNTER — Encounter: Payer: Self-pay | Admitting: Family Medicine

## 2016-08-29 ENCOUNTER — Ambulatory Visit (INDEPENDENT_AMBULATORY_CARE_PROVIDER_SITE_OTHER): Payer: Medicare Other | Admitting: Family Medicine

## 2016-08-29 ENCOUNTER — Emergency Department (HOSPITAL_COMMUNITY)
Admission: EM | Admit: 2016-08-29 | Discharge: 2016-08-29 | Disposition: A | Discharge status: Home / Self Care | Payer: Medicare Other | Attending: Emergency Medicine | Admitting: Emergency Medicine

## 2016-08-29 VITALS — BP 120/41 | HR 61 | Wt 151.0 lb

## 2016-08-29 DIAGNOSIS — Z79899 Other long term (current) drug therapy: Secondary | ICD-10-CM | POA: Insufficient documentation

## 2016-08-29 DIAGNOSIS — Z7902 Long term (current) use of antithrombotics/antiplatelets: Secondary | ICD-10-CM | POA: Insufficient documentation

## 2016-08-29 DIAGNOSIS — E440 Moderate protein-calorie malnutrition: Secondary | ICD-10-CM | POA: Diagnosis not present

## 2016-08-29 DIAGNOSIS — T8130XA Disruption of wound, unspecified, initial encounter: Secondary | ICD-10-CM | POA: Diagnosis not present

## 2016-08-29 DIAGNOSIS — I10 Essential (primary) hypertension: Secondary | ICD-10-CM | POA: Insufficient documentation

## 2016-08-29 DIAGNOSIS — Y69 Unspecified misadventure during surgical and medical care: Secondary | ICD-10-CM | POA: Insufficient documentation

## 2016-08-29 DIAGNOSIS — I2510 Atherosclerotic heart disease of native coronary artery without angina pectoris: Secondary | ICD-10-CM | POA: Diagnosis not present

## 2016-08-29 DIAGNOSIS — D239 Other benign neoplasm of skin, unspecified: Secondary | ICD-10-CM

## 2016-08-29 DIAGNOSIS — Z7982 Long term (current) use of aspirin: Secondary | ICD-10-CM | POA: Diagnosis not present

## 2016-08-29 DIAGNOSIS — Z87891 Personal history of nicotine dependence: Secondary | ICD-10-CM | POA: Diagnosis not present

## 2016-08-29 DIAGNOSIS — T8133XA Disruption of traumatic injury wound repair, initial encounter: Secondary | ICD-10-CM | POA: Diagnosis not present

## 2016-08-29 DIAGNOSIS — Z955 Presence of coronary angioplasty implant and graft: Secondary | ICD-10-CM | POA: Diagnosis not present

## 2016-08-29 DIAGNOSIS — E46 Unspecified protein-calorie malnutrition: Secondary | ICD-10-CM | POA: Insufficient documentation

## 2016-08-29 MED ORDER — LIDOCAINE-EPINEPHRINE 2 %-1:100000 IJ SOLN
20.0000 mL | Freq: Once | INTRAMUSCULAR | Status: AC
  Administered 2016-08-29: 20 mL
  Filled 2016-08-29: qty 20

## 2016-08-29 NOTE — Discharge Instructions (Signed)
Please read attached information. If you experience any new or worsening signs or symptoms please return to the emergency room for evaluation. Please follow-up with your primary care provider or specialist as discussed.  °

## 2016-08-29 NOTE — ED Provider Notes (Signed)
[facility] DEPT Provider Note   CSN: [id number] Arrival date & time: 08/29/16  1435  By signing my name below, I, Neta Mends, attest that this documentation has been prepared under the direction and in the presence of [employer], PA-C. Electronically Signed: Neta Mends, ED Scribe. 08/29/2016. 4:01 PM     History   Chief Complaint Chief Complaint  Patient presents with  . Suture / Staple Removal    The history is provided by the patient. No language interpreter was used.   HPI Comments:  FERRIN LIEBIG is a 71 y.o. female with PMHx of Afib who presents to the Emergency Department, here due to a complication after having 2 sutures removed earlier today. Pt was seen at [facility] 1 week ago and had a cancerous mole removed on her back by her PCP, and needed to have 2 sutures placed. She went back today to have the sutures removed and had the wound packed, and she started bleeding uncontrollably shortly after while going shopping. Pt takes Plavix regularly. Pt denies other associated symptoms.   PCP: Lynne Leader, MD   Past Medical History:  Diagnosis Date  . Anxiety   . Atrial fibrillation ([place])   . CAD (coronary artery disease)   . Claudication ([place]) 01/06/2008   Lower extremity dopplers - no evidence of arterial insufficiency, normal exam  . Coronary artery disease   . GERD (gastroesophageal reflux disease)   . Hyperlipidemia   . Hypertension 11/30/2010   echo- EF 55%; normal w/ mildly sclerotic aortic valve  . Hypertension 11/05/11   renal dopplers - celiac artery and SMA >50% diameter reduction, R renal artery - mildly elevated velocities 1-59% diameter reduction, L renal artery normal  . Nonspecific ST-T wave electrocardiographic changes 03/26/2011   R/Lmv - EF 74%, normal perfusion all regions, ST depression w/ Lexiscan infusion w/o assoc angina  . Osteopenia   . PAD (peripheral artery disease) ([place])   . Peripheral vascular  disease ([place])   . PVD (peripheral vascular disease) ([place]) 11/05/2011   doppler - R/L brachial pressures essentially equal w/o inflow disease; L sublclavian/CCA bypass graft demonstrates patent flow, no evidence of significant stenosis  . Sigmoid diverticulitis     Patient Active Problem List   Diagnosis Date Noted  . Protein-calorie malnutrition ([place]) 08/29/2016  . Atypical nevus 08/22/2016  . Acute bilateral low back pain with left-sided sciatica 06/06/2016  . Osteopenia 12/13/2015  . Menopause 12/08/2015  . History of colonoscopy 07/17/2015  . Irregular heartbeat 07/26/2014  . 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 Preoperative evaluation of a medical condition to rule out surgical contraindications (TAR required) 01/17/2014  . 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[place]) 01/16/2014  . Hyperlipidemia 04/05/2013  . Pre-syncope 12/06/2012  . Hypertension 12/06/2012  . Peripheral arterial disease ([place]) 12/06/2012  . Coronary artery disease, non-occlusive, last cath 2009 12/06/2012  . Hyponatremia, improved holding diuretic 12/06/2012  . Atherosclerosis of other specified arteries 04/02/2012  . 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 Stricture of artery ([place]) 10/02/2011  . Subclavian artery stenosis, left ([place]) 10/02/2011    Past Surgical History:  Procedure Laterality Date  . ABDOMINAL AORTIC ANEURYSM REPAIR N/A 01/27/2014   Procedure: AORTO-SUPERIOR MESENTERIC ARTERY BYPASS GRAFT;  Surgeon: Angelia Mould, MD;  Location: [facility];  Service: Vascular;  Laterality: N/A;  . ABDOMINAL HYSTERECTOMY    . APPENDECTOMY    . CARDIAC CATHETERIZATION  06/03/2008   60% LAD, involving D2, borderline significant by IVUS, medical therapy.  CFX, RCA OK.  . CHOLECYSTECTOMY    . EYE SURGERY     retinal surgery  . SPINE SURGERY  Feb. 2011  . [place]   X's  several  . SUBCLAVIAN ARTERY STENT Left 09/20/2008   LSA ISR 7x60mm Cordis Genesis on Opta premount, reduction from 80% to  0%  . subclavian artery stents  01/23/2010   Left carotid to subclavian artery Bypass  . VISCERAL ANGIOGRAM  01/17/2014   Procedure: VISCERAL ANGIOGRAM;  Surgeon: Angelia Mould, MD;  Location[facility];  Service: Cardiovascular;;    OB History    No data available       Home Medications    Prior to Admission medications   Medication Sig Start Date End Date Taking? Authorizing Provider  aspirin EC 81 MG tablet Take 81 mg by mouth daily.    Historical Provider, MD  ATACAND 16 MG tablet TAKE ONE-HALF (1/2) TABLET EVERY MORNING 10/31/15   Mihai Croitoru, MD  atenolol (TENORMIN) 25 MG tablet Take 1 tablet (25 mg total) by mouth daily. 02/08/16   Mihai Croitoru, MD  atorvastatin (LIPITOR) 10 MG tablet TAKE 1 TABLET EVERY EVENING 10/31/15   Mihai Croitoru, MD  cholecalciferol (VITAMIN D) 1000 UNITS tablet Take 1,000 Units by mouth daily.    Historical Provider, MD  clopidogrel (PLAVIX) 75 MG tablet TAKE 1 TABLET DAILY 10/31/15   Mihai Croitoru, MD  clorazepate (TRANXENE) 7.5 MG tablet Take 1 tablet (7.5 mg total) by mouth daily as needed. 05/01/16   Gregor Hams, MD  cyanocobalamin 1000 MCG tablet Take 100 mcg by mouth daily.    Historical Provider, MD  EPINEPHrine (EPIPEN 2-PAK) 0.3 mg/0.3 mL IJ SOAJ injection Inject 0.3 mLs (0.3 mg total) into the muscle as needed (for allergic reaction). 10/02/15   Sean Hommel, DO  esomeprazole (NEXIUM) 40 MG capsule Take 1 capsule (40 mg total) by mouth daily. Take 1 tab daily 02/23/15   Mihai Croitoru, MD  estradiol (ESTRACE) 0.5 MG tablet Take 1 tablet (0.5 mg total) by mouth daily. 06/26/16   Gregor Hams, MD  furosemide (LASIX) 20 MG tablet TAKE 1 TABLET EVERY OTHER DAY 11/20/15   Mihai Croitoru, MD  ipratropium (ATROVENT) 0.06 % nasal spray Place 2 sprays into both nostrils every 4 (four) hours as needed for rhinitis. 06/06/16   Gregor Hams, MD  linaclotide [facility]) 145 MCG CAPS capsule Take 1 capsule by mouth daily as needed. 12/22/15   Historical  Provider, MD  meclizine (ANTIVERT) 25 MG tablet Take 0.5 tablets (12.5 mg total) by mouth 3 (three) times daily as needed for dizziness or nausea. 05/01/16   Gregor Hams, MD  Multiple Vitamin (MULTIVITAMIN WITH MINERALS) TABS Take 1 tablet by mouth daily.    Historical Provider, MD  nitroGLYCERIN (NITROSTAT) 0.4 MG SL tablet Place 1 tablet (0.4 mg total) under the tongue every 5 (five) minutes as needed for chest pain. 04/08/16 07/07/16  Mihai Croitoru, MD  nystatin (MYCOSTATIN) 100000 UNIT/ML suspension Take 5 mLs (500,000 Units total) by mouth 4 (four) times daily. Swish for 30 seconds and swallow. 14 days 07/10/16   Gregor Hams, MD  ondansetron (ZOFRAN) 4 MG tablet Take 1 tablet (4 mg total) by mouth every 8 (eight) hours as needed for nausea or vomiting. 08/05/16   Noland Fordyce, PA-C  oseltamivir (TAMIFLU) 75 MG capsule Take 1 capsule (75 mg total) by mouth every 12 (twelve) hours. 08/05/16   Noland Fordyce, PA-C  polyethylene glycol (MIRALAX / GLYCOLAX) packet Take 17 g by mouth 2 (two) times daily. Until stooling regularly Patient taking differently: Take 8 g by mouth once. Until stooling regularly 11/09/14   Silverio Decamp, MD  zolpidem (AMBIEN) 10 MG tablet Take 1 tablet (10 mg total) by mouth at bedtime as needed for sleep. 05/01/16   Gregor Hams, MD    Family History Family History  Problem Relation Age of Onset  . Heart disease Father 93    Heart Disease before age 42  . Kidney disease Father   . Heart attack Father   . Hyperlipidemia Father   . Hypertension Father   . Kidney failure Mother   . Diabetes Maternal Grandmother   . Heart disease Paternal Grandfather     Social History Social History  Substance Use Topics  . Smoking status: Former Smoker    Packs/day: 0.25    Years: 20.00    Types: Cigarettes    Quit date: 09/30/1993  . Smokeless tobacco: Never Used  . Alcohol use No     Allergies   Cortisone; Dilaudid [hydromorphone hcl]; Iodine; Medrol  [methylprednisolone]; Omnipaque [iohexol]; Prednisone; Shellfish allergy; Sulfa drugs cross reactors; Augmentin [amoxicillin-pot clavulanate]; Morphine and related; Codeine; and Erythromycin   Review of Systems Review of Systems  Skin: Positive for wound.  Neurological: Negative for syncope.     Physical Exam Updated Vital Signs BP 136/76 (BP Location: Right Arm)   Pulse 76   Resp 18   SpO2 99%   Physical Exam  Constitutional: She is oriented to person, place, and time. She appears well-developed and well-nourished.  HENT:  Head: Normocephalic and atraumatic.  Eyes: Conjunctivae are normal. Pupils are equal, round, and reactive to light. Right eye exhibits no discharge. Left eye exhibits no discharge. No scleral icterus.  Neck: Normal range of motion. No JVD present. No tracheal deviation present.  Pulmonary/Chest: Effort normal. No stridor.  Neurological: She is alert and oriented to person, place, and time. Coordination normal.  Skin:  1cm incision to the left back that is clotted  Psychiatric: She has a normal mood and affect. Her behavior is normal. Judgment and thought content normal.  Nursing note and vitals reviewed.    ED Treatments / Results  DIAGNOSTIC STUDIES:  Oxygen Saturation is 100% on RA, normal by my interpretation.    COORDINATION OF CARE:  3:27 PM Discussed treatment plan with pt at bedside and pt agreed to plan.   Labs (all labs ordered are listed, but only abnormal results are displayed) Labs Reviewed - No data to display  EKG  EKG Interpretation None       Radiology No results found.  Procedures .Marland KitchenLaceration Repair Date/Time: 08/29/2016 3:59 PM Performed by: Penni Bombard, Abie Killian Authorized by: Penni Bombard, Stevee Valenta   Consent:    Consent obtained:  Verbal   Consent given by:  Patient Anesthesia (see MAR for exact dosages):    Anesthesia method: 2cc Lidocaine with epi. Laceration details:    Location:  Trunk   Trunk location:  Lower  back Repair type:    Repair type:  Simple Exploration:    Contaminated: no   Skin repair:    Repair method:  Sutures   Number of sutures:  5 Post-procedure details:    Patient tolerance of procedure:  Tolerated well, no immediate complications   (including critical care time)  Medications Ordered in ED Medications  lidocaine-EPINEPHrine (XYLOCAINE W/EPI) 2 %-1:100000 (with pres) injection 20 mL (20 mLs Infiltration Given 08/29/16 1558)  Initial Impression / Assessment and Plan / ED Course  I have reviewed the triage vital signs and the nursing notes.  Pertinent labs & imaging results that were available during my care of the patient were reviewed by me and considered in my medical decision making (see chart for details).     71 year old female presents today with wound dehiscence.  Patient had a mole removed, sutures removed today.  The suture site opened up causing bleeding as she is on Plavix.  At the time of evaluation there is no bleeding and there was a clot formation.  Patient was requesting that I move the clot and repair of the incision.  She had no signs of infection around the site, the clot was removed and reapproximated.  Patient will follow up with her primary care in the next several days for reassessment, return immediately if any bleeding occurs.  She will have sutures removed in approximately 10 days depending on wound status.  She understood and agreed to today's plan had no further questions or concerns the time discharge  Final Clinical Impressions(s) / ED Diagnoses   Final diagnoses:  Wound dehiscence    New Prescriptions Discharge Medication List as of 08/29/2016  4:00 PM    I personally performed the services described in this documentation, which was scribed in my presence. The recorded information has been reviewed and is accurate.   Okey Regal, PA-C 08/29/16 [place], MD 08/30/16 [phone]

## 2016-08-29 NOTE — ED Triage Notes (Signed)
Patient states that she had a mole removed on her back and now stiches are starting to come out after shopping today.

## 2016-08-29 NOTE — Progress Notes (Signed)
Traci [name] is a 71 y.o. female who presents to [facility] today for follow-up recent excisional biopsy. Patient was seen last week for follow-up excisional biopsy of dysplastic nevus. She's here today for suture removal. She notes the wound feels well and only itches a little. She denies any pain or discharge from the wound.  Additionally she notes consistent difficulty tolerating meals. She estimates additionally about 800 cal per day. She notes when she eats more that she gets nauseated due to her history of multiple abdominal surgeries and chronic gut ischemia.   Past Medical History:  Diagnosis Date  . Anxiety   . Atrial fibrillation ([place])   . CAD (coronary artery disease)   . Claudication ([place]) 01/06/2008   Lower extremity dopplers - no evidence of arterial insufficiency, normal exam  . Coronary artery disease   . GERD (gastroesophageal reflux disease)   . Hyperlipidemia   . Hypertension 11/30/2010   echo- EF 55%; normal w/ mildly sclerotic aortic valve  . Hypertension 11/05/11   renal dopplers - celiac artery and SMA >50% diameter reduction, R renal artery - mildly elevated velocities 1-59% diameter reduction, L renal artery normal  . Nonspecific ST-T wave electrocardiographic changes 03/26/2011   R/Lmv - EF 74%, normal perfusion all regions, ST depression w/ Lexiscan infusion w/o assoc angina  . Osteopenia   . PAD (peripheral artery disease) ([place])   . Peripheral vascular disease ([place])   . PVD (peripheral vascular disease) ([place]) 11/05/2011   doppler - R/L brachial pressures essentially equal w/o inflow disease; L sublclavian/CCA bypass graft demonstrates patent flow, no evidence of significant stenosis  . Sigmoid diverticulitis    Past Surgical History:  Procedure Laterality Date  . ABDOMINAL AORTIC ANEURYSM REPAIR N/A 01/27/2014   Procedure:  AORTO-SUPERIOR MESENTERIC ARTERY BYPASS GRAFT;  Surgeon: Angelia Mould, MD;  Location: [facility];  Service: Vascular;  Laterality: N/A;  . ABDOMINAL HYSTERECTOMY    . APPENDECTOMY    . CARDIAC CATHETERIZATION  06/03/2008   60% LAD, involving D2, borderline significant by IVUS, medical therapy. CFX, RCA OK.  . CHOLECYSTECTOMY    . EYE SURGERY     retinal surgery  . SPINE SURGERY  Feb. 2011  . [place]   X's  several  . SUBCLAVIAN ARTERY STENT Left 09/20/2008   LSA ISR 7x47mm Cordis Genesis on Opta premount, reduction from 80% to 0%  . subclavian artery stents  01/23/2010   Left carotid to subclavian artery Bypass  . VISCERAL ANGIOGRAM  01/17/2014   Procedure: VISCERAL ANGIOGRAM;  Surgeon: Angelia Mould, MD;  Location: [facility] CATH LAB;  Service: Cardiovascular;;   Social History  Substance Use Topics  . Smoking status: Former Smoker    Packs/day: 0.25    Years: 20.00    Types: Cigarettes    Quit date: 09/30/1993  . Smokeless tobacco: Never Used  . Alcohol use No   family history includes Diabetes in her maternal grandmother; Heart attack in her father; Heart disease in her paternal grandfather; Heart disease (age of onset: 24) in her father; Hyperlipidemia in her father; Hypertension in her father; Kidney disease in her father; Kidney failure in her mother.  ROS as above:  Medications: Current Outpatient Prescriptions  Medication Sig Dispense Refill  . aspirin EC 81 MG tablet Take 81 mg by mouth daily.    . ATACAND 16 MG tablet TAKE ONE-HALF (1/2) TABLET EVERY MORNING 45 tablet  3  . atenolol (TENORMIN) 25 MG tablet Take 1 tablet (25 mg total) by mouth daily. 90 tablet 3  . atorvastatin (LIPITOR) 10 MG tablet TAKE 1 TABLET EVERY EVENING 90 tablet 3  . cholecalciferol (VITAMIN D) 1000 UNITS tablet Take 1,000 Units by mouth daily.    . clopidogrel (PLAVIX) 75 MG tablet TAKE 1 TABLET DAILY 90 tablet 3  . clorazepate (TRANXENE) 7.5 MG tablet Take 1 tablet (7.5  mg total) by mouth daily as needed. 90 tablet 1  . cyanocobalamin 1000 MCG tablet Take 100 mcg by mouth daily.    Marland Kitchen EPINEPHrine (EPIPEN 2-PAK) 0.3 mg/0.3 mL IJ SOAJ injection Inject 0.3 mLs (0.3 mg total) into the muscle as needed (for allergic reaction). 2 Device 0  . esomeprazole (NEXIUM) 40 MG capsule Take 1 capsule (40 mg total) by mouth daily. Take 1 tab daily 90 capsule 0  . estradiol (ESTRACE) 0.5 MG tablet Take 1 tablet (0.5 mg total) by mouth daily. 90 tablet 1  . furosemide (LASIX) 20 MG tablet TAKE 1 TABLET EVERY OTHER DAY 45 tablet 3  . ipratropium (ATROVENT) 0.06 % nasal spray Place 2 sprays into both nostrils every 4 (four) hours as needed for rhinitis. 10 mL 6  . linaclotide (LINZESS) 145 MCG CAPS capsule Take 1 capsule by mouth daily as needed.    . meclizine (ANTIVERT) 25 MG tablet Take 0.5 tablets (12.5 mg total) by mouth 3 (three) times daily as needed for dizziness or nausea. 90 tablet 1  . Multiple Vitamin (MULTIVITAMIN WITH MINERALS) TABS Take 1 tablet by mouth daily.    Marland Kitchen nystatin (MYCOSTATIN) 100000 UNIT/ML suspension Take 5 mLs (500,000 Units total) by mouth 4 (four) times daily. Swish for 30 seconds and swallow. 14 days 180 mL 1  . ondansetron (ZOFRAN) 4 MG tablet Take 1 tablet (4 mg total) by mouth every 8 (eight) hours as needed for nausea or vomiting. 12 tablet 0  . oseltamivir (TAMIFLU) 75 MG capsule Take 1 capsule (75 mg total) by mouth every 12 (twelve) hours. 10 capsule 0  . polyethylene glycol (MIRALAX / GLYCOLAX) packet Take 17 g by mouth 2 (two) times daily. Until stooling regularly (Patient taking differently: Take 8 g by mouth once. Until stooling regularly) 30 packet 11  . zolpidem (AMBIEN) 10 MG tablet Take 1 tablet (10 mg total) by mouth at bedtime as needed for sleep. 90 tablet 0  . nitroGLYCERIN (NITROSTAT) 0.4 MG SL tablet Place 1 tablet (0.4 mg total) under the tongue every 5 (five) minutes as needed for chest pain. 25 tablet 3   No current  facility-administered medications for this visit.    Allergies  Allergen Reactions  . Cortisone Anaphylaxis  . Dilaudid [Hydromorphone Hcl] Nausea And Vomiting    Pt states she will start vomiting immediately for 6 hours  . Iodine Anaphylaxis  . Medrol [Methylprednisolone] Anaphylaxis  . Omnipaque [Iohexol] Anaphylaxis  . Prednisone Anaphylaxis  . Shellfish Allergy Anaphylaxis  . Sulfa Drugs Cross Reactors Anaphylaxis  . Augmentin [Amoxicillin-Pot Clavulanate]     Upset stomach  . Morphine And Related     nausea  . Codeine Rash  . Erythromycin Rash    Health Maintenance Health Maintenance  Topic Date Due  . MAMMOGRAM  11/21/2017  . TETANUS/TDAP  06/17/2020  . COLONOSCOPY  07/16/2025  . INFLUENZA VACCINE  Completed  . DEXA SCAN  Completed  . Hepatitis C Screening  Completed  . PNA vac Low Risk Adult  Completed  Exam:  BP (!) 120/41   Pulse 61   Wt 151 lb (68.5 kg)   BMI 25.92 kg/m   Wt Readings from Last 10 Encounters:  08/29/16 151 lb (68.5 kg)  08/22/16 151 lb 8 oz (68.7 kg)  08/14/16 149 lb (67.6 kg)  08/05/16 150 lb (68 kg)  07/10/16 148 lb (67.1 kg)  06/26/16 148 lb (67.1 kg)  06/24/16 150 lb (68 kg)  06/06/16 148 lb (67.1 kg)  04/16/16 147 lb (66.7 kg)  04/08/16 147 lb 4 oz (66.8 kg)    Gen: Well NAD Decreased temporal muscle bulk and thenar bulk HEENT: EOMI,  MMM Lungs: Normal work of breathing. CTABL Heart: RRR no MRG Abd: NABS, Soft. Nondistended, Nontender Exts: Brisk capillary refill, warm and well perfused.  Skin: Well-appearing wound right low back with 3 sutures that were easily removed. Dressing applied. No bleeding erythema or discharge.     Assessment and Plan: 71 y.o. female with  Dysplastic nevus successfully removed based on pathology results. Return as needed.  Gastric fullness and likely protein calorie malnutrition area refer to dietitian for further advice on eating with what sounds like gastroparesis.   Orders Placed  This Encounter  Procedures  . Amb ref to Medical Nutrition Therapy-MNT    Referral Priority:   Routine    Referral Type:   Consultation    Referral Reason:   Specialty Services Required    Requested Specialty:   Nutrition    Number of Visits Requested:   1   No orders of the defined types were placed in this encounter.    Discussed warning signs or symptoms. Please see discharge instructions. Patient expresses understanding.

## 2016-08-29 NOTE — Patient Instructions (Signed)
Thank you for coming in today. You should hear from the dietitian soon.  Let me know if you do not hear anything soon.  Recheck as needed.  Remove dressing in 1-2 days.

## 2016-08-30 ENCOUNTER — Encounter: Payer: Medicare Other | Admitting: Physical Therapy

## 2016-09-01 ENCOUNTER — Other Ambulatory Visit: Payer: Self-pay

## 2016-09-01 ENCOUNTER — Encounter (HOSPITAL_BASED_OUTPATIENT_CLINIC_OR_DEPARTMENT_OTHER): Payer: Self-pay

## 2016-09-01 ENCOUNTER — Emergency Department (HOSPITAL_BASED_OUTPATIENT_CLINIC_OR_DEPARTMENT_OTHER): Payer: Medicare Other

## 2016-09-01 ENCOUNTER — Emergency Department (HOSPITAL_BASED_OUTPATIENT_CLINIC_OR_DEPARTMENT_OTHER)
Admission: EM | Admit: 2016-09-01 | Discharge: 2016-09-01 | Disposition: A | Discharge status: Home / Self Care | Payer: Medicare Other | Attending: Emergency Medicine | Admitting: Emergency Medicine

## 2016-09-01 DIAGNOSIS — I2510 Atherosclerotic heart disease of native coronary artery without angina pectoris: Secondary | ICD-10-CM | POA: Insufficient documentation

## 2016-09-01 DIAGNOSIS — Z79899 Other long term (current) drug therapy: Secondary | ICD-10-CM | POA: Insufficient documentation

## 2016-09-01 DIAGNOSIS — R1084 Generalized abdominal pain: Secondary | ICD-10-CM | POA: Diagnosis not present

## 2016-09-01 DIAGNOSIS — Z7982 Long term (current) use of aspirin: Secondary | ICD-10-CM | POA: Diagnosis not present

## 2016-09-01 DIAGNOSIS — R1013 Epigastric pain: Secondary | ICD-10-CM | POA: Diagnosis not present

## 2016-09-01 DIAGNOSIS — I10 Essential (primary) hypertension: Secondary | ICD-10-CM | POA: Diagnosis not present

## 2016-09-01 DIAGNOSIS — Z87891 Personal history of nicotine dependence: Secondary | ICD-10-CM | POA: Diagnosis not present

## 2016-09-01 DIAGNOSIS — R1010 Upper abdominal pain, unspecified: Secondary | ICD-10-CM | POA: Diagnosis present

## 2016-09-01 LAB — COMPREHENSIVE METABOLIC PANEL
ALBUMIN: 3.8 g/dL (ref 3.5–5.0)
ALT: 18 U/L (ref 14–54)
AST: 26 U/L (ref 15–41)
Alkaline Phosphatase: 51 U/L (ref 38–126)
Anion gap: 6 (ref 5–15)
BILIRUBIN TOTAL: 0.4 mg/dL (ref 0.3–1.2)
BUN: 16 mg/dL (ref 6–20)
CALCIUM: 8.4 mg/dL — AB (ref 8.9–10.3)
CO2: 28 mmol/L (ref 22–32)
CREATININE: 0.99 mg/dL (ref 0.44–1.00)
Chloride: 102 mmol/L (ref 101–111)
GFR calc Af Amer: >60 mL/min (ref >60)
GFR, EST NON AFRICAN AMERICAN: 56 mL/min — AB (ref >60)
GLUCOSE: 118 mg/dL — AB (ref 65–99)
Potassium: 4.1 mmol/L (ref 3.5–5.1)
Sodium: 136 mmol/L (ref 135–145)
TOTAL PROTEIN: 7 g/dL (ref 6.5–8.1)

## 2016-09-01 LAB — CBC WITH DIFFERENTIAL/PLATELET
BASOS ABS: 0 10*3/uL (ref 0.0–0.1)
BASOS PCT: 0 %
Eosinophils Absolute: 0.3 10*3/uL (ref 0.0–0.7)
Eosinophils Relative: 6 %
HEMATOCRIT: 36.1 % (ref 36.0–46.0)
HEMOGLOBIN: 12 g/dL (ref 12.0–15.0)
LYMPHS PCT: 44 %
Lymphs Abs: 2.4 10*3/uL (ref 0.7–4.0)
MCH: 29.6 pg (ref 26.0–34.0)
MCHC: 33.2 g/dL (ref 30.0–36.0)
MCV: 88.9 fL (ref 78.0–100.0)
MONO ABS: 0.5 10*3/uL (ref 0.1–1.0)
MONOS PCT: 9 %
NEUTROS PCT: 41 %
Neutro Abs: 2.2 10*3/uL (ref 1.7–7.7)
Platelets: 277 10*3/uL (ref 150–400)
RBC: 4.06 MIL/uL (ref 3.87–5.11)
RDW: 12.4 % (ref 11.5–15.5)
WBC: 5.5 10*3/uL (ref 4.0–10.5)

## 2016-09-01 LAB — LIPASE, BLOOD: LIPASE: 36 U/L (ref 11–51)

## 2016-09-01 MED ORDER — SODIUM CHLORIDE 0.9 % IV BOLUS (SEPSIS)
1000.0000 mL | Freq: Once | INTRAVENOUS | Status: AC
  Administered 2016-09-01: 1000 mL via INTRAVENOUS

## 2016-09-01 NOTE — ED Notes (Signed)
ED Provider at bedside. 

## 2016-09-01 NOTE — ED Notes (Signed)
Pt given d/c instructions as per chart. Verbalizes understanding. No questions. 

## 2016-09-01 NOTE — ED Triage Notes (Signed)
Pt reports diffuse abdominal pain that radiates into left upper chest pain. Pt denies nausea and vomiting.

## 2016-09-01 NOTE — ED Provider Notes (Signed)
[facility] DEPT [facility] Provider Note   CSN: [id number] Arrival date & time: 09/01/16  1955   By signing my name below, I, Delton Prairie, attest that this documentation has been prepared under the direction and in the presence of Deno Etienne, DO  Electronically Signed: Delton Prairie, ED Scribe. 09/01/16. 8:52 PM.   History   Chief Complaint Chief Complaint  Patient presents with  . Abdominal Pain  . Chest Pain   The history is provided by the patient. No language interpreter was used.   HPI Comments:  Traci [name] is a 71 y.o. female, with a PMHx of GERD, CAD, HTN and hyperlipidemia, who presents to the Emergency Department complaining of acute onset, intermittent, upper abdominal pain onset several days. Pt also reports a squeezing sensation to her left upper extremity and chills. Her symptoms are worse with eating. No alleviating factors noted. Pt denies vomiting, diarrhea, fevers or any other associated symptoms. No other complaints noted.    Past Medical History:  Diagnosis Date  . Anxiety   . Atrial fibrillation ([place])   . CAD (coronary artery disease)   . Claudication ([place]) 01/06/2008   Lower extremity dopplers - no evidence of arterial insufficiency, normal exam  . Coronary artery disease   . GERD (gastroesophageal reflux disease)   . Hyperlipidemia   . Hypertension 11/30/2010   echo- EF 55%; normal w/ mildly sclerotic aortic valve  . Hypertension 11/05/11   renal dopplers - celiac artery and SMA >50% diameter reduction, R renal artery - mildly elevated velocities 1-59% diameter reduction, L renal artery normal  . Nonspecific ST-T wave electrocardiographic changes 03/26/2011   R/Lmv - EF 74%, normal perfusion all regions, ST depression w/ Lexiscan infusion w/o assoc angina  . Osteopenia   . PAD (peripheral artery disease) ([place])   . Peripheral vascular disease ([place])   . PVD (peripheral vascular disease) ([place]) 11/05/2011   doppler - R/L brachial pressures  essentially equal w/o inflow disease; L sublclavian/CCA bypass graft demonstrates patent flow, no evidence of significant stenosis  . Sigmoid diverticulitis     Patient Active Problem List   Diagnosis Date Noted  . Protein-calorie malnutrition ([place]) 08/29/2016  . Atypical nevus 08/22/2016  . Acute bilateral low back pain with left-sided sciatica 06/06/2016  . Osteopenia 12/13/2015  . Menopause 12/08/2015  . History of colonoscopy 07/17/2015  . Irregular heartbeat 07/26/2014  . 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 Preoperative evaluation of a medical condition to rule out surgical contraindications (TAR required) 01/17/2014  . 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[place]) 01/16/2014  . Hyperlipidemia 04/05/2013  . Pre-syncope 12/06/2012  . Hypertension 12/06/2012  . Peripheral arterial disease ([place]) 12/06/2012  . Coronary artery disease, non-occlusive, last cath 2009 12/06/2012  . Hyponatremia, improved holding diuretic 12/06/2012  . Atherosclerosis of other specified arteries 04/02/2012  . 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 Stricture of artery ([place]) 10/02/2011  . Subclavian artery stenosis, left ([place]) 10/02/2011    Past Surgical History:  Procedure Laterality Date  . ABDOMINAL AORTIC ANEURYSM REPAIR N/A 01/27/2014   Procedure: AORTO-SUPERIOR MESENTERIC ARTERY BYPASS GRAFT;  Surgeon: Angelia Mould, MD;  Location: [facility];  Service: Vascular;  Laterality: N/A;  . ABDOMINAL HYSTERECTOMY    . APPENDECTOMY    . CARDIAC CATHETERIZATION  06/03/2008   60% LAD, involving D2, borderline significant by IVUS, medical therapy. CFX, RCA OK.  . CHOLECYSTECTOMY    . EYE SURGERY     retinal surgery  . SPINE SURGERY  Feb. 2011  .  [place]   X's  several  . SUBCLAVIAN ARTERY STENT Left 09/20/2008   LSA ISR 7x45mm Cordis Genesis on Opta premount, reduction from 80% to 0%  . subclavian artery stents  01/23/2010   Left carotid to subclavian artery Bypass  . VISCERAL  ANGIOGRAM  01/17/2014   Procedure: VISCERAL ANGIOGRAM;  Surgeon: Angelia Mould, MD;  Location: [facility] CATH LAB;  Service: Cardiovascular;;    OB History    No data available       Home Medications    Prior to Admission medications   Medication Sig Start Date End Date Taking? Authorizing Provider  aspirin EC 81 MG tablet Take 81 mg by mouth daily.    Historical Provider, MD  ATACAND 16 MG tablet TAKE ONE-HALF (1/2) TABLET EVERY MORNING 10/31/15   Mihai Croitoru, MD  atenolol (TENORMIN) 25 MG tablet Take 1 tablet (25 mg total) by mouth daily. 02/08/16   Mihai Croitoru, MD  atorvastatin (LIPITOR) 10 MG tablet TAKE 1 TABLET EVERY EVENING 10/31/15   Mihai Croitoru, MD  cholecalciferol (VITAMIN D) 1000 UNITS tablet Take 1,000 Units by mouth daily.    Historical Provider, MD  clopidogrel (PLAVIX) 75 MG tablet TAKE 1 TABLET DAILY 10/31/15   Mihai Croitoru, MD  clorazepate (TRANXENE) 7.5 MG tablet Take 1 tablet (7.5 mg total) by mouth daily as needed. 05/01/16   Gregor Hams, MD  cyanocobalamin 1000 MCG tablet Take 100 mcg by mouth daily.    Historical Provider, MD  EPINEPHrine (EPIPEN 2-PAK) 0.3 mg/0.3 mL IJ SOAJ injection Inject 0.3 mLs (0.3 mg total) into the muscle as needed (for allergic reaction). 10/02/15   Sean Hommel, DO  esomeprazole (NEXIUM) 40 MG capsule Take 1 capsule (40 mg total) by mouth daily. Take 1 tab daily 02/23/15   Mihai Croitoru, MD  estradiol (ESTRACE) 0.5 MG tablet Take 1 tablet (0.5 mg total) by mouth daily. 06/26/16   Gregor Hams, MD  furosemide (LASIX) 20 MG tablet TAKE 1 TABLET EVERY OTHER DAY 11/20/15   Mihai Croitoru, MD  ipratropium (ATROVENT) 0.06 % nasal spray Place 2 sprays into both nostrils every 4 (four) hours as needed for rhinitis. 06/06/16   Gregor Hams, MD  linaclotide [facility]) 145 MCG CAPS capsule Take 1 capsule by mouth daily as needed. 12/22/15   Historical Provider, MD  meclizine (ANTIVERT) 25 MG tablet Take 0.5 tablets (12.5 mg total) by mouth 3 (three)  times daily as needed for dizziness or nausea. 05/01/16   Gregor Hams, MD  Multiple Vitamin (MULTIVITAMIN WITH MINERALS) TABS Take 1 tablet by mouth daily.    Historical Provider, MD  nitroGLYCERIN (NITROSTAT) 0.4 MG SL tablet Place 1 tablet (0.4 mg total) under the tongue every 5 (five) minutes as needed for chest pain. 04/08/16 07/07/16  Mihai Croitoru, MD  nystatin (MYCOSTATIN) 100000 UNIT/ML suspension Take 5 mLs (500,000 Units total) by mouth 4 (four) times daily. Swish for 30 seconds and swallow. 14 days 07/10/16   Gregor Hams, MD  ondansetron (ZOFRAN) 4 MG tablet Take 1 tablet (4 mg total) by mouth every 8 (eight) hours as needed for nausea or vomiting. 08/05/16   Noland Fordyce, PA-C  oseltamivir (TAMIFLU) 75 MG capsule Take 1 capsule (75 mg total) by mouth every 12 (twelve) hours. 08/05/16   Noland Fordyce, PA-C  polyethylene glycol (MIRALAX / GLYCOLAX) packet Take 17 g by mouth 2 (two) times daily. Until stooling regularly Patient taking differently: Take 8 g by mouth once.  Until stooling regularly 11/09/14   Silverio Decamp, MD  zolpidem (AMBIEN) 10 MG tablet Take 1 tablet (10 mg total) by mouth at bedtime as needed for sleep. 05/01/16   Gregor Hams, MD    Family History Family History  Problem Relation Age of Onset  . Heart disease Father 27    Heart Disease before age 21  . Kidney disease Father   . Heart attack Father   . Hyperlipidemia Father   . Hypertension Father   . Kidney failure Mother   . Diabetes Maternal Grandmother   . Heart disease Paternal Grandfather     Social History Social History  Substance Use Topics  . Smoking status: Former Smoker    Packs/day: 0.25    Years: 20.00    Types: Cigarettes    Quit date: 09/30/1993  . Smokeless tobacco: Never Used  . Alcohol use No     Allergies   Cortisone; Dilaudid [hydromorphone hcl]; Iodine; Medrol [methylprednisolone]; Omnipaque [iohexol]; Prednisone; Shellfish allergy; Sulfa drugs cross reactors; Augmentin  [amoxicillin-pot clavulanate]; Morphine and related; Codeine; and Erythromycin   Review of Systems Review of Systems  Constitutional: Positive for chills. Negative for fever.  HENT: Negative for congestion and rhinorrhea.   Eyes: Negative for redness and visual disturbance.  Respiratory: Negative for shortness of breath and wheezing.   Cardiovascular: Negative for palpitations.  Gastrointestinal: Positive for abdominal pain. Negative for diarrhea, nausea and vomiting.  Genitourinary: Negative for dysuria and urgency.  Musculoskeletal: Positive for myalgias. Negative for arthralgias.  Skin: Negative for pallor and wound.  Neurological: Negative for dizziness and headaches.  All other systems reviewed and are negative.  Physical Exam Updated Vital Signs BP (!) 139/57 (BP Location: Left Arm)   Pulse (!) 56   Temp 97.9 F (36.6 C) (Oral)   Resp 20   SpO2 100%   Physical Exam  Constitutional: She is oriented to person, place, and time. She appears well-developed and well-nourished. No distress.  HENT:  Head: Normocephalic and atraumatic.  Eyes: EOM are normal. Pupils are equal, round, and reactive to light.  Neck: Normal range of motion. Neck supple.  Cardiovascular: Normal rate and regular rhythm.  Exam reveals no gallop and no friction rub.   No murmur heard. Pulmonary/Chest: Effort normal. She has no wheezes. She has no rales.  Abdominal: Soft. She exhibits no distension. There is tenderness in the epigastric area.  Mild epigastric pain on exam.   Musculoskeletal: She exhibits no edema or tenderness.  Neurological: She is alert and oriented to person, place, and time.  Skin: Skin is warm and dry. She is not diaphoretic.  Psychiatric: She has a normal mood and affect. Her behavior is normal.  Nursing note and vitals reviewed.  ED Treatments / Results  DIAGNOSTIC STUDIES:  Oxygen Saturation is 97% on RA, normal by my interpretation.    COORDINATION OF CARE:  8:37 PM  Discussed treatment plan with pt at bedside and pt agreed to plan.  Labs (all labs ordered are listed, but only abnormal results are displayed) Labs Reviewed  COMPREHENSIVE METABOLIC PANEL - Abnormal; Notable for the following:       Result Value   Glucose, Bld 118 (*)    Calcium 8.4 (*)    GFR calc non Af Amer 56 (*)    All other components within normal limits  CBC WITH DIFFERENTIAL/PLATELET  LIPASE, BLOOD    EKG  EKG Interpretation  Date/Time:  Sunday September 01 2016 20:01:49 EDT Ventricular Rate:  65 PR Interval:  142 QRS Duration: 80 QT Interval:  410 QTC Calculation: 426 R Axis:   49 Text Interpretation:  Normal sinus rhythm Normal ECG No significant change since last tracing Confirmed by Demani Mcbrien MD, DANIEL [id number]) on 09/01/2016 9:37:43 PM       Radiology Ct Abdomen Pelvis Wo Contrast  Result Date: 09/01/2016 CLINICAL DATA:  Generalized abdominal pain for 3 days. History of peripheral vascular disease, hypertension, coronary artery disease, hysterectomy, abdominal aortic aneurysm repair. EXAM: CT ABDOMEN AND PELVIS WITHOUT CONTRAST TECHNIQUE: Multidetector CT imaging of the abdomen and pelvis was performed following the standard protocol without IV contrast. COMPARISON:  MRI abdomen 04/04/2016. CT abdomen and pelvis 05/14/2015 FINDINGS: Lower chest: Atelectasis in the lung bases. Hepatobiliary: No focal liver lesions. Gallbladder is not identified and could be surgically absent or contracted. No bile duct dilatation. Pancreas: Unremarkable. No pancreatic ductal dilatation or surrounding inflammatory changes. Spleen: Normal in size without focal abnormality. Adrenals/Urinary Tract: Adrenal glands are unremarkable. Kidneys are normal, without renal calculi, focal lesion, or hydronephrosis. Bladder is unremarkable. Stomach/Bowel: Stomach, small bowel, and colon are not abnormally distended. Stomach is full of food. Stool throughout the colon. Diverticulosis of the sigmoid colon. No  evidence of diverticulitis. Appendix appears to be surgically absent. Vascular/Lymphatic: Aortic atherosclerosis. No enlarged abdominal or pelvic lymph nodes. There appears to be a short vascular graft at the origin of the superior mesenteric artery. Reproductive: Status post hysterectomy. No adnexal masses. Other: There appears to be recanalization of the periumbilical veins. Ventral abdominal wall hernia containing small amount of fat. No bowel herniation. No free air or free fluid in the abdomen. Musculoskeletal: Postoperative changes at L5-S1. Normal alignment of the lumbar spine. No destructive bone lesions. IMPRESSION: No acute process suggested on noncontrast imaging of the abdomen or pelvis. Aortic atherosclerosis. Probable recanalization of the periumbilical veins. Ventral abdominal wall hernia containing fat. No bowel herniation. Diverticulosis of the sigmoid colon without evidence of diverticulitis. Electronically Signed   By: Lucienne Capers M.D.   On: 09/01/2016 21:30    Procedures Procedures (including critical care time)  Medications Ordered in ED Medications  sodium chloride 0.9 % bolus 1,000 mL (0 mLs Intravenous Stopped 09/01/16 2203)     Initial Impression / Assessment and Plan / ED Course  I have reviewed the triage vital signs and the nursing notes.  Pertinent labs & imaging results that were available during my care of the patient were reviewed by me and considered in my medical decision making (see chart for details).     71 yo F With a chief complaints of upper abdominal pain. This is diffuse across the upper abdomen. Worse with movement palpation. Physical exam with very mild tenderness. CT scan with no specific finding. Patient is also been complaining of some left arm tingling. Patient has some significant spasm to the left trapezius muscle. She has been doing some rehabilitation in order to help her back pain. I wonder if this is all musculoskeletal. We'll have the  patient follow with her family physician.  11:27 PM:  I have discussed the diagnosis/risks/treatment options with the patient and believe the pt to be eligible for discharge home to follow-up with PCP. We also discussed returning to the ED immediately if new or worsening sx occur. We discussed the sx which are most concerning (e.g., sudden worsening pain, fever, inability to tolerate by mouth) that necessitate immediate return. Medications administered to the patient during their visit and any new prescriptions provided to the patient  are listed below.  Medications given during this visit Medications  sodium chloride 0.9 % bolus 1,000 mL (0 mLs Intravenous Stopped 09/01/16 2203)     The patient appears reasonably screen and/or stabilized for discharge and I doubt any other medical condition or other [facility] requiring further screening, evaluation, or treatment in the ED at this time prior to discharge.    Final Clinical Impressions(s) / ED Diagnoses   Final diagnoses:  Epigastric pain    New Prescriptions Discharge Medication List as of 09/01/2016  9:47 PM     I personally performed the services described in this documentation, which was scribed in my presence. The recorded information has been reviewed and is accurate.     Deno Etienne, DO 09/01/16 2327

## 2016-09-04 ENCOUNTER — Ambulatory Visit (INDEPENDENT_AMBULATORY_CARE_PROVIDER_SITE_OTHER): Payer: Medicare Other | Admitting: Vascular Surgery

## 2016-09-04 ENCOUNTER — Encounter: Payer: Self-pay | Admitting: Vascular Surgery

## 2016-09-04 ENCOUNTER — Ambulatory Visit (HOSPITAL_COMMUNITY)
Admission: RE | Admit: 2016-09-04 | Discharge: 2016-09-04 | Disposition: A | Discharge status: Home / Self Care | Payer: Medicare Other | Source: Ambulatory Visit | Attending: Vascular Surgery | Admitting: Vascular Surgery

## 2016-09-04 VITALS — BP 120/80 | HR 57 | Temp 99.2°F | Resp 18 | Ht 64.0 in | Wt 150.0 lb

## 2016-09-04 DIAGNOSIS — Z95828 Presence of other vascular implants and grafts: Secondary | ICD-10-CM | POA: Diagnosis not present

## 2016-09-04 DIAGNOSIS — Z48812 Encounter for surgical aftercare following surgery on the circulatory system: Secondary | ICD-10-CM

## 2016-09-04 DIAGNOSIS — R0989 Other specified symptoms and signs involving the circulatory and respiratory systems: Secondary | ICD-10-CM | POA: Diagnosis not present

## 2016-09-04 DIAGNOSIS — I6529 Occlusion and stenosis of unspecified carotid artery: Secondary | ICD-10-CM

## 2016-09-04 LAB — VAS US CAROTID
LCCAPSYS: 182 cm/s
LEFT ECA DIAS: -8 cm/s
LICAPDIAS: 19 cm/s
LICAPSYS: 93 cm/s
Left CCA dist dias: -19 cm/s
Left CCA dist sys: -88 cm/s
Left ICA dist dias: -20 cm/s
Left ICA dist sys: -68 cm/s
RCCAPSYS: 57 cm/s
RIGHT CCA MID DIAS: 14 cm/s
RIGHT ECA DIAS: -5 cm/s
Right CCA prox dias: 11 cm/s
Right cca dist sys: -97 cm/s

## 2016-09-04 NOTE — Progress Notes (Signed)
Patient name: Traci [name] MRN: [id number] DOB: [birth year] Sex: female  REASON FOR VISIT: Follow up after aorto superior mesenteric artery bypass and also previous left carotid to subclavian bypass.  HPI: Traci [name] is a 71 y.o. female who underwent an aorto to superior mesenteric artery bypass with a six-month meter Dacron graft on 01/27/2014. When I saw her last a year ago (08/30/2015) she was doing well with no postprandial abdominal pain and she had gained 5 pounds.  She has also undergone a previous left carotid to subclavian bypass in 2013. She had a palpable radial pulse at her last visit.  Since I saw her last she does occasionally get some abdominal pain but this does not appear to be postprandial and she has gained weight. For this reason there is no reason to suspect occlusion of her SMA bypass graft. She does have a small ventral hernia was noted on a CT scan done about a week ago. I reviewed these images and the ventral hernias fairly small. There is no bowel involved with the small hernia. She does have some issues with chronic constipation.  She denies any left arm symptoms or significant dizziness.  Past Medical History:  Diagnosis Date  . Anxiety   . Atrial fibrillation ([place])   . CAD (coronary artery disease)   . Claudication ([place]) 01/06/2008   Lower extremity dopplers - no evidence of arterial insufficiency, normal exam  . Coronary artery disease   . GERD (gastroesophageal reflux disease)   . Hyperlipidemia   . Hypertension 11/30/2010   echo- EF 55%; normal w/ mildly sclerotic aortic valve  . Hypertension 11/05/11   renal dopplers - celiac artery and SMA >50% diameter reduction, R renal artery - mildly elevated velocities 1-59% diameter reduction, L renal artery normal  . Nonspecific ST-T wave electrocardiographic changes 03/26/2011   R/Lmv - EF 74%, normal perfusion all regions, ST depression w/ Lexiscan infusion w/o assoc angina  . Osteopenia    . PAD (peripheral artery disease) ([place])   . Peripheral vascular disease ([place])   . PVD (peripheral vascular disease) ([place]) 11/05/2011   doppler - R/L brachial pressures essentially equal w/o inflow disease; L sublclavian/CCA bypass graft demonstrates patent flow, no evidence of significant stenosis  . Sigmoid diverticulitis     Family History  Problem Relation Age of Onset  . Heart disease Father 84    Heart Disease before age 57  . Kidney disease Father   . Heart attack Father   . Hyperlipidemia Father   . Hypertension Father   . Kidney failure Mother   . Diabetes Maternal Grandmother   . Heart disease Paternal Grandfather     SOCIAL HISTORY: Social History  Substance Use Topics  . Smoking status: Former Smoker    Packs/day: 0.25    Years: 20.00    Types: Cigarettes    Quit date: 09/30/1993  . Smokeless tobacco: Never Used  . Alcohol use No    Allergies  Allergen Reactions  . Cortisone Anaphylaxis  . Dilaudid [Hydromorphone Hcl] Nausea And Vomiting    Pt states she will start vomiting immediately for 6 hours  . Iodine Anaphylaxis  . Medrol [Methylprednisolone] Anaphylaxis  . Omnipaque [Iohexol] Anaphylaxis  . Prednisone Anaphylaxis  . Shellfish Allergy Anaphylaxis  . Sulfa Drugs Cross Reactors Anaphylaxis  . Augmentin [Amoxicillin-Pot Clavulanate]     Upset stomach  . Morphine And Related     nausea  . Codeine Rash  . Erythromycin Rash    Current  Outpatient Prescriptions  Medication Sig Dispense Refill  . aspirin EC 81 MG tablet Take 81 mg by mouth daily.    . ATACAND 16 MG tablet TAKE ONE-HALF (1/2) TABLET EVERY MORNING 45 tablet 3  . atenolol (TENORMIN) 25 MG tablet Take 1 tablet (25 mg total) by mouth daily. 90 tablet 3  . atorvastatin (LIPITOR) 10 MG tablet TAKE 1 TABLET EVERY EVENING 90 tablet 3  . cholecalciferol (VITAMIN D) 1000 UNITS tablet Take 1,000 Units by mouth daily.    . clopidogrel (PLAVIX) 75 MG tablet TAKE 1 TABLET DAILY 90 tablet 3  .  clorazepate (TRANXENE) 7.5 MG tablet Take 1 tablet (7.5 mg total) by mouth daily as needed. 90 tablet 1  . cyanocobalamin 1000 MCG tablet Take 100 mcg by mouth daily.    Marland Kitchen EPINEPHrine (EPIPEN 2-PAK) 0.3 mg/0.3 mL IJ SOAJ injection Inject 0.3 mLs (0.3 mg total) into the muscle as needed (for allergic reaction). 2 Device 0  . esomeprazole (NEXIUM) 40 MG capsule Take 1 capsule (40 mg total) by mouth daily. Take 1 tab daily 90 capsule 0  . estradiol (ESTRACE) 0.5 MG tablet Take 1 tablet (0.5 mg total) by mouth daily. 90 tablet 1  . furosemide (LASIX) 20 MG tablet TAKE 1 TABLET EVERY OTHER DAY 45 tablet 3  . ipratropium (ATROVENT) 0.06 % nasal spray Place 2 sprays into both nostrils every 4 (four) hours as needed for rhinitis. 10 mL 6  . linaclotide (LINZESS) 145 MCG CAPS capsule Take 1 capsule by mouth daily as needed.    . meclizine (ANTIVERT) 25 MG tablet Take 0.5 tablets (12.5 mg total) by mouth 3 (three) times daily as needed for dizziness or nausea. 90 tablet 1  . Multiple Vitamin (MULTIVITAMIN WITH MINERALS) TABS Take 1 tablet by mouth daily.    . nitroGLYCERIN (NITROSTAT) 0.4 MG SL tablet Place 1 tablet (0.4 mg total) under the tongue every 5 (five) minutes as needed for chest pain. 25 tablet 3  . nystatin (MYCOSTATIN) 100000 UNIT/ML suspension Take 5 mLs (500,000 Units total) by mouth 4 (four) times daily. Swish for 30 seconds and swallow. 14 days 180 mL 1  . ondansetron (ZOFRAN) 4 MG tablet Take 1 tablet (4 mg total) by mouth every 8 (eight) hours as needed for nausea or vomiting. 12 tablet 0  . oseltamivir (TAMIFLU) 75 MG capsule Take 1 capsule (75 mg total) by mouth every 12 (twelve) hours. 10 capsule 0  . polyethylene glycol (MIRALAX / GLYCOLAX) packet Take 17 g by mouth 2 (two) times daily. Until stooling regularly (Patient taking differently: Take 8 g by mouth once. Until stooling regularly) 30 packet 11  . zolpidem (AMBIEN) 10 MG tablet Take 1 tablet (10 mg total) by mouth at bedtime as  needed for sleep. 90 tablet 0   No current facility-administered medications for this visit.     REVIEW OF SYSTEMS:  [X]  denotes positive finding, [ ]  denotes negative finding Cardiac  Comments:  Chest pain or chest pressure:    Shortness of breath upon exertion: X   Short of breath when lying flat: X   Irregular heart rhythm:        Vascular    Pain in calf, thigh, or hip brought on by ambulation: X   Pain in feet at night that wakes you up from your sleep:     Blood clot in your veins:    Leg swelling:         Pulmonary    Oxygen  at home:    Productive cough:     Wheezing:         Neurologic    Sudden weakness in arms or legs:     Sudden numbness in arms or legs:     Sudden onset of difficulty speaking or slurred speech:    Temporary loss of vision in one eye:     Problems with dizziness:  X       Gastrointestinal    Blood in stool:     Vomited blood:         Genitourinary    Burning when urinating:     Blood in urine:        Psychiatric    Major depression:         Hematologic    Bleeding problems:    Problems with blood clotting too easily:        Skin    Rashes or ulcers:        Constitutional    Fever or chills:      PHYSICAL EXAM: Vitals:   09/04/16 1334  BP: (!) 160/70  Pulse: (!) 57  Resp: 18  Temp: 99.2 F (37.3 C)  TempSrc: Oral  SpO2: 99%  Weight: 150 lb (68 kg)  Height: 5\' 4"  (6.122 m)    GENERAL: The patient is a well-nourished female, in no acute distress. The vital signs are documented above. CARDIAC: There is a regular rate and rhythm.  VASCULAR: I do not detect carotid bruits. She has palpable radial pulses. She has palpable femoral pulses and warm well-perfused feet.  PULMONARY: There is good air exchange bilaterally without wheezing or rales. ABDOMEN: Soft and non-tender with normal pitched bowel sounds.  MUSCULOSKELETAL: There are no major deformities or cyanosis. NEUROLOGIC: No focal weakness or paresthesias are  detected. SKIN: There are no ulcers or rashes noted. PSYCHIATRIC: The patient has a normal affect.  DATA:   CAROTID DUPLEX: I have independently interpreted her carotid duplex scan today.  There is a less than 39% internal carotid artery stenosis bilaterally.  Her left common carotid to subclavian artery bypass graft is patent without evidence of stenosis.  MEDICAL ISSUES:  STATUS POST AORTO SMA BYPASS: She does not have any postprandial abdominal pain and she has gained weight. Therefore I think her bypass graft is patent. She did have a CT scan but this was done without contrast. She does have some vague abdominal pain and chronic constipation. If her symptoms progress I recommended follow up with her gastroenterologist. After reviewing her CT scan she does have a small ventral hernia but there is no involvement of the intestine and I think this is fairly small and can simply be followed.  STATUS POST LEFT CAROTID SUBCLAVIAN BYPASS: Her bypass graft is patent. She has a palpable radial pulse. We will continue to follow this on a yearly basis with carotid duplex scan. She is not a smoker. She is on aspirin and is on a statin.   [name] [facility] [phone]

## 2016-09-05 NOTE — Addendum Note (Signed)
Addended by: Lianne Cure A on: 09/05/2016 02:13 PM   Modules accepted: Orders

## 2016-09-06 ENCOUNTER — Ambulatory Visit (INDEPENDENT_AMBULATORY_CARE_PROVIDER_SITE_OTHER): Payer: Medicare Other | Admitting: Family Medicine

## 2016-09-06 VITALS — BP 127/49 | HR 60 | Wt 153.0 lb

## 2016-09-06 DIAGNOSIS — Z5189 Encounter for other specified aftercare: Secondary | ICD-10-CM

## 2016-09-06 DIAGNOSIS — S21201A Unspecified open wound of right back wall of thorax without penetration into thoracic cavity, initial encounter: Secondary | ICD-10-CM

## 2016-09-06 DIAGNOSIS — I6529 Occlusion and stenosis of unspecified carotid artery: Secondary | ICD-10-CM

## 2016-09-06 NOTE — Patient Instructions (Addendum)
Thank you for coming in today. Return in about 1 week for suture removal.

## 2016-09-06 NOTE — Progress Notes (Signed)
Traci [name] is a 71 y.o. female who presents to [facility] today for wound check. Patient had suture removal last week.  A few hours after the sutures removal of the wound had spontaneous D has since. She had bleeding with it and went to the emergency room with the wound was closed again with sutures. She was advised to return 1 week later for recheck and have the sutures removed 2 weeks later in total. She is doing well with no pain or fevers or chills   Past Medical History:  Diagnosis Date  . Anxiety   . Atrial fibrillation ([employer])   . CAD (coronary artery disease)   . Claudication ([place]) 01/06/2008   Lower extremity dopplers - no evidence of arterial insufficiency, normal exam  . Coronary artery disease   . GERD (gastroesophageal reflux disease)   . Hyperlipidemia   . Hypertension 11/30/2010   echo- EF 55%; normal w/ mildly sclerotic aortic valve  . Hypertension 11/05/11   renal dopplers - celiac artery and SMA >50% diameter reduction, R renal artery - mildly elevated velocities 1-59% diameter reduction, L renal artery normal  . Nonspecific ST-T wave electrocardiographic changes 03/26/2011   R/Lmv - EF 74%, normal perfusion all regions, ST depression w/ Lexiscan infusion w/o assoc angina  . Osteopenia   . PAD (peripheral artery disease) ([place])   . Peripheral vascular disease ([place])   . PVD (peripheral vascular disease) ([place]) 11/05/2011   doppler - R/L brachial pressures essentially equal w/o inflow disease; L sublclavian/CCA bypass graft demonstrates patent flow, no evidence of significant stenosis  . Sigmoid diverticulitis    Past Surgical History:  Procedure Laterality Date  . ABDOMINAL AORTIC ANEURYSM REPAIR N/A 01/27/2014   Procedure: AORTO-SUPERIOR MESENTERIC ARTERY BYPASS GRAFT;  Surgeon: Angelia Mould, MD;  Location: [facility];  Service:  Vascular;  Laterality: N/A;  . ABDOMINAL HYSTERECTOMY    . APPENDECTOMY    . CARDIAC CATHETERIZATION  06/03/2008   60% LAD, involving D2, borderline significant by IVUS, medical therapy. CFX, RCA OK.  . CHOLECYSTECTOMY    . EYE SURGERY     retinal surgery  . SPINE SURGERY  Feb. 2011  . [place]   X's  several  . SUBCLAVIAN ARTERY STENT Left 09/20/2008   LSA ISR 7x46mm Cordis Genesis on Opta premount, reduction from 80% to 0%  . subclavian artery stents  01/23/2010   Left carotid to subclavian artery Bypass  . VISCERAL ANGIOGRAM  01/17/2014   Procedure: VISCERAL ANGIOGRAM;  Surgeon: Angelia Mould, MD;  [facility];  Service: Cardiovascular;;   Social History  Substance Use Topics  . Smoking status: Former Smoker    Packs/day: 0.25    Years: 20.00    Types: Cigarettes    Quit date: 09/30/1993  . Smokeless tobacco: Never Used  . Alcohol use No   family history includes Diabetes in her maternal grandmother; Heart attack in her father; Heart disease in her paternal grandfather; Heart disease (age of onset: 68) in her father; Hyperlipidemia in her father; Hypertension in her father; Kidney disease in her father; Kidney failure in her mother.  ROS as above:  Medications: Current Outpatient Prescriptions  Medication Sig Dispense Refill  . aspirin EC 81 MG tablet Take 81 mg by mouth daily.    . ATACAND 16 MG tablet TAKE ONE-HALF (1/2) TABLET EVERY MORNING 45 tablet 3  . atenolol (TENORMIN) 25  MG tablet Take 1 tablet (25 mg total) by mouth daily. 90 tablet 3  . atorvastatin (LIPITOR) 10 MG tablet TAKE 1 TABLET EVERY EVENING 90 tablet 3  . cholecalciferol (VITAMIN D) 1000 UNITS tablet Take 1,000 Units by mouth daily.    . clopidogrel (PLAVIX) 75 MG tablet TAKE 1 TABLET DAILY 90 tablet 3  . clorazepate (TRANXENE) 7.5 MG tablet Take 1 tablet (7.5 mg total) by mouth daily as needed. 90 tablet 1  . cyanocobalamin 1000 MCG tablet Take 100 mcg by mouth daily.     Marland Kitchen EPINEPHrine (EPIPEN 2-PAK) 0.3 mg/0.3 mL IJ SOAJ injection Inject 0.3 mLs (0.3 mg total) into the muscle as needed (for allergic reaction). 2 Device 0  . esomeprazole (NEXIUM) 40 MG capsule Take 1 capsule (40 mg total) by mouth daily. Take 1 tab daily 90 capsule 0  . estradiol (ESTRACE) 0.5 MG tablet Take 1 tablet (0.5 mg total) by mouth daily. 90 tablet 1  . furosemide (LASIX) 20 MG tablet TAKE 1 TABLET EVERY OTHER DAY 45 tablet 3  . ipratropium (ATROVENT) 0.06 % nasal spray Place 2 sprays into both nostrils every 4 (four) hours as needed for rhinitis. 10 mL 6  . linaclotide (LINZESS) 145 MCG CAPS capsule Take 1 capsule by mouth daily as needed.    . meclizine (ANTIVERT) 25 MG tablet Take 0.5 tablets (12.5 mg total) by mouth 3 (three) times daily as needed for dizziness or nausea. 90 tablet 1  . Multiple Vitamin (MULTIVITAMIN WITH MINERALS) TABS Take 1 tablet by mouth daily.    Marland Kitchen nystatin (MYCOSTATIN) 100000 UNIT/ML suspension Take 5 mLs (500,000 Units total) by mouth 4 (four) times daily. Swish for 30 seconds and swallow. 14 days 180 mL 1  . ondansetron (ZOFRAN) 4 MG tablet Take 1 tablet (4 mg total) by mouth every 8 (eight) hours as needed for nausea or vomiting. 12 tablet 0  . oseltamivir (TAMIFLU) 75 MG capsule Take 1 capsule (75 mg total) by mouth every 12 (twelve) hours. 10 capsule 0  . polyethylene glycol (MIRALAX / GLYCOLAX) packet Take 17 g by mouth 2 (two) times daily. Until stooling regularly (Patient taking differently: Take 8 g by mouth once. Until stooling regularly) 30 packet 11  . zolpidem (AMBIEN) 10 MG tablet Take 1 tablet (10 mg total) by mouth at bedtime as needed for sleep. 90 tablet 0   No current facility-administered medications for this visit.    Allergies  Allergen Reactions  . Cortisone Anaphylaxis  . Dilaudid [Hydromorphone Hcl] Nausea And Vomiting    Pt states she will start vomiting immediately for 6 hours  . Iodine Anaphylaxis  . Medrol [Methylprednisolone]  Anaphylaxis  . Omnipaque [Iohexol] Anaphylaxis  . Prednisone Anaphylaxis  . Shellfish Allergy Anaphylaxis  . Sulfa Drugs Cross Reactors Anaphylaxis  . Augmentin [Amoxicillin-Pot Clavulanate]     Upset stomach  . Morphine And Related     nausea  . Codeine Rash  . Erythromycin Rash    Health Maintenance Health Maintenance  Topic Date Due  . MAMMOGRAM  11/21/2017  . TETANUS/TDAP  06/17/2020  . COLONOSCOPY  07/16/2025  . INFLUENZA VACCINE  Completed  . DEXA SCAN  Completed  . Hepatitis C Screening  Completed  . PNA vac Low Risk Adult  Completed     Exam:  BP (!) 127/49   Pulse 60   Wt 153 lb (69.4 kg)   BMI 26.26 kg/m  Gen: Well NAD Skin: Well-appearing well sutured wound with no tenderness or  pain or discharge or erythema  No results found for this or any previous visit (from the past 72 hour(s)). No results found.    Assessment and Plan: 71 y.o. female with skin wound. Doing well. Return in one week for suture removal. Will use Steri-Strips to reinforce the wound after suture removal.   No orders of the defined types were placed in this encounter.  No orders of the defined types were placed in this encounter.    Discussed warning signs or symptoms. Please see discharge instructions. Patient expresses understanding.

## 2016-09-07 ENCOUNTER — Emergency Department (HOSPITAL_COMMUNITY): Payer: Medicare Other

## 2016-09-07 ENCOUNTER — Emergency Department (HOSPITAL_COMMUNITY)
Admission: EM | Admit: 2016-09-07 | Discharge: 2016-09-07 | Disposition: A | Discharge status: Home / Self Care | Payer: Medicare Other | Attending: Emergency Medicine | Admitting: Emergency Medicine

## 2016-09-07 ENCOUNTER — Encounter (HOSPITAL_COMMUNITY): Payer: Self-pay | Admitting: Emergency Medicine

## 2016-09-07 DIAGNOSIS — I2510 Atherosclerotic heart disease of native coronary artery without angina pectoris: Secondary | ICD-10-CM | POA: Diagnosis not present

## 2016-09-07 DIAGNOSIS — R51 Headache: Secondary | ICD-10-CM | POA: Insufficient documentation

## 2016-09-07 DIAGNOSIS — Z7982 Long term (current) use of aspirin: Secondary | ICD-10-CM | POA: Diagnosis not present

## 2016-09-07 DIAGNOSIS — R0602 Shortness of breath: Secondary | ICD-10-CM | POA: Diagnosis not present

## 2016-09-07 DIAGNOSIS — I10 Essential (primary) hypertension: Secondary | ICD-10-CM | POA: Diagnosis not present

## 2016-09-07 DIAGNOSIS — Z87891 Personal history of nicotine dependence: Secondary | ICD-10-CM | POA: Diagnosis not present

## 2016-09-07 DIAGNOSIS — R079 Chest pain, unspecified: Secondary | ICD-10-CM | POA: Diagnosis not present

## 2016-09-07 DIAGNOSIS — R519 Headache, unspecified: Secondary | ICD-10-CM

## 2016-09-07 DIAGNOSIS — R072 Precordial pain: Secondary | ICD-10-CM

## 2016-09-07 DIAGNOSIS — H538 Other visual disturbances: Secondary | ICD-10-CM | POA: Diagnosis not present

## 2016-09-07 LAB — CBC
HEMATOCRIT: 35.1 % — AB (ref 36.0–46.0)
Hemoglobin: 11.4 g/dL — ABNORMAL LOW (ref 12.0–15.0)
MCH: 28.6 pg (ref 26.0–34.0)
MCHC: 32.5 g/dL (ref 30.0–36.0)
MCV: 88 fL (ref 78.0–100.0)
PLATELETS: 216 10*3/uL (ref 150–400)
RBC: 3.99 MIL/uL (ref 3.87–5.11)
RDW: 12.6 % (ref 11.5–15.5)
WBC: 4.9 10*3/uL (ref 4.0–10.5)

## 2016-09-07 LAB — BASIC METABOLIC PANEL
Anion gap: 9 (ref 5–15)
BUN: 11 mg/dL (ref 6–20)
CO2: 26 mmol/L (ref 22–32)
Calcium: 8.3 mg/dL — ABNORMAL LOW (ref 8.9–10.3)
Chloride: 104 mmol/L (ref 101–111)
Creatinine, Ser: 0.82 mg/dL (ref 0.44–1.00)
Glucose, Bld: 117 mg/dL — ABNORMAL HIGH (ref 65–99)
POTASSIUM: 3.9 mmol/L (ref 3.5–5.1)
SODIUM: 139 mmol/L (ref 135–145)

## 2016-09-07 LAB — TROPONIN I: Troponin I: <0.03 ng/mL (ref <0.03)

## 2016-09-07 NOTE — ED Notes (Signed)
Pt to xray

## 2016-09-07 NOTE — ED Provider Notes (Signed)
[facility] DEPT Provider Note   CSN: [id number] Arrival date & time: 09/07/16  0231  By signing my name below, I, Arianna Nassar, attest that this documentation has been prepared under the direction and in the presence of Jola Schmidt, MD.  Electronically Signed: Julien Nordmann, ED Scribe. 09/07/16. 2:53 AM.   History   Chief Complaint Chief Complaint  Patient presents with  . Chest Pain   The history is provided by the patient and the EMS personnel. No language interpreter was used.   HPI Comments: REIANNA BATDORF is a 71 y.o. female who has a PMhx of a-fib, anxiety, CAD, claudication, HLD, GERD, HTN, PAD, PVD, and left subclavian artery stenosis presents to the Emergency Department by EMS complaining of acute onset, left sided chest pain that radiated to her left shoulder and left breast that began this evening. She reports associated intermittent nausea, headache, and blurry vision all day. Pt says she woke up feeling extremely short of breath 192/110. She expresses she took 3 baby ASA before calling EMS to help with her symptoms with mild relief. Pt received 4 mg of zofran and 1 NTG en route which also modified her chest pain. She has had a subclavian heart stent placed and an abdominal aortic aneurysm repair in 2015 performed by Dr. Bobette Mo. Pt has had multiple heart catheterizations. She has no PMhx of MI.   Past Medical History:  Diagnosis Date  . Anxiety   . Atrial fibrillation ([place])   . CAD (coronary artery disease)   . Claudication ([place]) 01/06/2008   Lower extremity dopplers - no evidence of arterial insufficiency, normal exam  . Coronary artery disease   . GERD (gastroesophageal reflux disease)   . Hyperlipidemia   . Hypertension 11/30/2010   echo- EF 55%; normal w/ mildly sclerotic aortic valve  . Hypertension 11/05/11   renal dopplers - celiac artery and SMA >50% diameter reduction, R renal artery - mildly elevated velocities 1-59% diameter reduction, L  renal artery normal  . Nonspecific ST-T wave electrocardiographic changes 03/26/2011   R/Lmv - EF 74%, normal perfusion all regions, ST depression w/ Lexiscan infusion w/o assoc angina  . Osteopenia   . PAD (peripheral artery disease) ([place])   . Peripheral vascular disease ([place])   . PVD (peripheral vascular disease) ([place]) 11/05/2011   doppler - R/L brachial pressures essentially equal w/o inflow disease; L sublclavian/CCA bypass graft demonstrates patent flow, no evidence of significant stenosis  . Sigmoid diverticulitis     Patient Active Problem List   Diagnosis Date Noted  . Protein-calorie malnutrition ([place]) 08/29/2016  . Atypical nevus 08/22/2016  . Acute bilateral low back pain with left-sided sciatica 06/06/2016  . Osteopenia 12/13/2015  . Menopause 12/08/2015  . History of colonoscopy 07/17/2015  . Irregular heartbeat 07/26/2014  . 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 Preoperative evaluation of a medical condition to rule out surgical contraindications (TAR required) 01/17/2014  . 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[place]) 01/16/2014  . Hyperlipidemia 04/05/2013  . Pre-syncope 12/06/2012  . Hypertension 12/06/2012  . Peripheral arterial disease ([place]) 12/06/2012  . Coronary artery disease, non-occlusive, last cath 2009 12/06/2012  . Hyponatremia, improved holding diuretic 12/06/2012  . Atherosclerosis of other specified arteries 04/02/2012  . Subclavian arterial stenosis ([place]) 04/02/2012  . Stricture of artery ([place]) 10/02/2011  . Subclavian artery stenosis, left ([place]) 10/02/2011    Past Surgical History:  Procedure Laterality Date  . ABDOMINAL AORTIC ANEURYSM REPAIR N/A 01/27/2014   Procedure: AORTO-SUPERIOR MESENTERIC ARTERY BYPASS GRAFT;  Surgeon: Harrell Gave  Nicole Cella, MD;  Location: [facility];  Service: Vascular;  Laterality: N/A;  . ABDOMINAL HYSTERECTOMY    . APPENDECTOMY    . CARDIAC CATHETERIZATION  06/03/2008   60% LAD, involving D2, borderline significant by IVUS, medical  therapy. CFX, RCA OK.  . CHOLECYSTECTOMY    . EYE SURGERY     retinal surgery  . SPINE SURGERY  Feb. 2011  . [place]   X's  several  . SUBCLAVIAN ARTERY STENT Left 09/20/2008   LSA ISR 7x85mm Cordis Genesis on Opta premount, reduction from 80% to 0%  . subclavian artery stents  01/23/2010   Left carotid to subclavian artery Bypass  . VISCERAL ANGIOGRAM  01/17/2014   Procedure: VISCERAL ANGIOGRAM;  Surgeon: Angelia Mould, MD;  Location: [facility] CATH LAB;  Service: Cardiovascular;;    OB History    No data available       Home Medications    Prior to Admission medications   Medication Sig Start Date End Date Taking? Authorizing Provider  aspirin EC 81 MG tablet Take 81 mg by mouth daily.    Historical Provider, MD  ATACAND 16 MG tablet TAKE ONE-HALF (1/2) TABLET EVERY MORNING 10/31/15   Mihai Croitoru, MD  atenolol (TENORMIN) 25 MG tablet Take 1 tablet (25 mg total) by mouth daily. 02/08/16   Mihai Croitoru, MD  atorvastatin (LIPITOR) 10 MG tablet TAKE 1 TABLET EVERY EVENING 10/31/15   Mihai Croitoru, MD  cholecalciferol (VITAMIN D) 1000 UNITS tablet Take 1,000 Units by mouth daily.    Historical Provider, MD  clopidogrel (PLAVIX) 75 MG tablet TAKE 1 TABLET DAILY 10/31/15   Mihai Croitoru, MD  clorazepate (TRANXENE) 7.5 MG tablet Take 1 tablet (7.5 mg total) by mouth daily as needed. 05/01/16   Gregor Hams, MD  cyanocobalamin 1000 MCG tablet Take 100 mcg by mouth daily.    Historical Provider, MD  EPINEPHrine (EPIPEN 2-PAK) 0.3 mg/0.3 mL IJ SOAJ injection Inject 0.3 mLs (0.3 mg total) into the muscle as needed (for allergic reaction). 10/02/15   Sean Hommel, DO  esomeprazole (NEXIUM) 40 MG capsule Take 1 capsule (40 mg total) by mouth daily. Take 1 tab daily 02/23/15   Mihai Croitoru, MD  estradiol (ESTRACE) 0.5 MG tablet Take 1 tablet (0.5 mg total) by mouth daily. 06/26/16   Gregor Hams, MD  furosemide (LASIX) 20 MG tablet TAKE 1 TABLET EVERY OTHER DAY 11/20/15   Mihai  Croitoru, MD  ipratropium (ATROVENT) 0.06 % nasal spray Place 2 sprays into both nostrils every 4 (four) hours as needed for rhinitis. 06/06/16   Gregor Hams, MD  linaclotide [facility]) 145 MCG CAPS capsule Take 1 capsule by mouth daily as needed. 12/22/15   Historical Provider, MD  meclizine (ANTIVERT) 25 MG tablet Take 0.5 tablets (12.5 mg total) by mouth 3 (three) times daily as needed for dizziness or nausea. 05/01/16   Gregor Hams, MD  Multiple Vitamin (MULTIVITAMIN WITH MINERALS) TABS Take 1 tablet by mouth daily.    Historical Provider, MD  nystatin (MYCOSTATIN) 100000 UNIT/ML suspension Take 5 mLs (500,000 Units total) by mouth 4 (four) times daily. Swish for 30 seconds and swallow. 14 days 07/10/16   Gregor Hams, MD  ondansetron (ZOFRAN) 4 MG tablet Take 1 tablet (4 mg total) by mouth every 8 (eight) hours as needed for nausea or vomiting. 08/05/16   Noland Fordyce, PA-C  oseltamivir (TAMIFLU) 75 MG capsule Take 1 capsule (75 mg total) by mouth  every 12 (twelve) hours. 08/05/16   Noland Fordyce, PA-C  polyethylene glycol (MIRALAX / GLYCOLAX) packet Take 17 g by mouth 2 (two) times daily. Until stooling regularly Patient taking differently: Take 8 g by mouth once. Until stooling regularly 11/09/14   Silverio Decamp, MD  zolpidem (AMBIEN) 10 MG tablet Take 1 tablet (10 mg total) by mouth at bedtime as needed for sleep. 05/01/16   Gregor Hams, MD    Family History Family History  Problem Relation Age of Onset  . Heart disease Father 73    Heart Disease before age 80  . Kidney disease Father   . Heart attack Father   . Hyperlipidemia Father   . Hypertension Father   . Kidney failure Mother   . Diabetes Maternal Grandmother   . Heart disease Paternal Grandfather     Social History Social History  Substance Use Topics  . Smoking status: Former Smoker    Packs/day: 0.25    Years: 20.00    Types: Cigarettes    Quit date: 09/30/1993  . Smokeless tobacco: Never Used  . Alcohol use  No     Allergies   Cortisone; Dilaudid [hydromorphone hcl]; Iodine; Medrol [methylprednisolone]; Omnipaque [iohexol]; Prednisone; Shellfish allergy; Sulfa drugs cross reactors; Augmentin [amoxicillin-pot clavulanate]; Morphine and related; Codeine; and Erythromycin   Review of Systems Review of Systems  A complete 10 system review of systems was obtained and all systems are negative except as noted in the HPI and PMH.   Physical Exam Updated Vital Signs BP (!) 165/58 (BP Location: Right Arm)   Pulse (!) 57   Temp 97.6 F (36.4 C) (Oral)   Resp 16   Ht 5\' 3"  (1.6 m)   Wt 149 lb (67.6 kg)   SpO2 99%   BMI 26.39 kg/m   Physical Exam  Constitutional: She is oriented to person, place, and time. She appears well-developed and well-nourished. No distress.  HENT:  Head: Normocephalic and atraumatic.  Eyes: EOM are normal. Pupils are equal, round, and reactive to light.  Neck: Normal range of motion.  Cardiovascular: Normal rate, regular rhythm and normal heart sounds.   Pulmonary/Chest: Effort normal and breath sounds normal.  Abdominal: Soft. She exhibits no distension. There is no tenderness.  Musculoskeletal: Normal range of motion.  Normal bilateral radial pulses  Neurological: She is alert and oriented to person, place, and time.  5/5 strength in major muscle groups of  bilateral upper and lower extremities. Speech normal. No facial asymetry.   Skin: Skin is warm and dry.  Psychiatric: She has a normal mood and affect. Judgment normal.  Nursing note and vitals reviewed.    ED Treatments / Results  DIAGNOSTIC STUDIES: Oxygen Saturation is 99% on RA, normal by my interpretation.  COORDINATION OF CARE:  2:52 AM Discussed treatment plan with pt at bedside and pt agreed to plan.  Labs (all labs ordered are listed, but only abnormal results are displayed) Labs Reviewed  BASIC METABOLIC PANEL - Abnormal; Notable for the following:       Result Value   Glucose, Bld 117  (*)    Calcium 8.3 (*)    All other components within normal limits  CBC - Abnormal; Notable for the following:    Hemoglobin 11.4 (*)    HCT 35.1 (*)    All other components within normal limits  TROPONIN I    EKG  EKG Interpretation  Date/Time:  Saturday September 07 2016 02:39:30 EDT Ventricular Rate:  57  PR Interval:    QRS Duration: 110 QT Interval:  453 QTC Calculation: 442 R Axis:   61 Text Interpretation:  Sinus rhythm No significant change was found Confirmed by Abdishakur Gottschall  MD, Lennette Bihari ([id number]) on 09/07/2016 2:41:59 AM       Radiology Dg Chest 2 View  Result Date: 09/07/2016 CLINICAL DATA:  Acute onset of shortness of breath and left-sided chest pain. Initial encounter. EXAM: CHEST  2 VIEW COMPARISON:  Chest radiograph performed 08/05/2016 FINDINGS: The lungs are well-aerated. Minimal bibasilar scarring is noted. There is no evidence of focal opacification, pleural effusion or pneumothorax. The heart is borderline normal in size. No acute osseous abnormalities are seen. A vascular stent is noted overlying the superior mediastinum. IMPRESSION: Minimal bibasilar scarring noted.  Lungs otherwise clear. Electronically Signed   By: Garald Balding M.D.   On: 09/07/2016 03:27   Ct Head Wo Contrast  Result Date: 09/07/2016 CLINICAL DATA:  71 year old female with headache and blurred vision. EXAM: CT HEAD WITHOUT CONTRAST TECHNIQUE: Contiguous axial images were obtained from the base of the skull through the vertex without intravenous contrast. COMPARISON:  None. FINDINGS: Brain: The ventricles and sulci appropriate size for patient's age. Minimal periventricular and deep white matter chronic microvascular ischemic changes noted. There is no acute intracranial hemorrhage. No mass effect or midline shift. No extra-axial fluid collection. Vascular: No hyperdense vessel or unexpected calcification. Skull: Normal. Negative for fracture or focal lesion. Sinuses/Orbits: No acute finding. Other: None.  IMPRESSION: 1. No acute intracranial hemorrhage. 2. Mild chronic microvascular ischemic changes. If symptoms persist, and there are no contraindications, MRI may provide better evaluation if clinically indicated. Electronically Signed   By: Anner Crete M.D.   On: 09/07/2016 03:43    Procedures Procedures (including critical care time)  Medications Ordered in ED Medications - No data to display   Initial Impression / Assessment and Plan / ED Course  I have reviewed the triage vital signs and the nursing notes.  Pertinent labs & imaging results that were available during my care of the patient were reviewed by me and considered in my medical decision making (see chart for details).     Overall well appearing. No significant findings found on workup. No CP at this time. No SOB now. No hypoxia. No indication for additional testing or workup in the hospital at this time.  Close primary care follow-up.  Blood pressures has since normalized.  No hypoxia.  Doubt PE.  Doubt ACS.  Doubt dissection.  Doubt stroke.  Final Clinical Impressions(s) / ED Diagnoses   Final diagnoses:  Precordial pain  Acute nonintractable headache, unspecified headache type   I personally performed the services described in this documentation, which was scribed in my presence. The recorded information has been reviewed and is accurate.     New Prescriptions New Prescriptions   No medications on file     Jola Schmidt, MD 09/07/16 [phone]

## 2016-09-07 NOTE — ED Notes (Signed)
Patient transported to CT 

## 2016-09-07 NOTE — ED Triage Notes (Signed)
Per EMS: Pt to ED from home for SOB that woke her up out of her sleep. Pt stated she had CP that radiated to left shoulder. Initail BP was 194/68. BP now is 166/64. Pt c/o N and given 4 mg of zofran IM. No N at this moment. Pt took 3 baby aspirin before EMS arrival.

## 2016-09-12 ENCOUNTER — Ambulatory Visit (INDEPENDENT_AMBULATORY_CARE_PROVIDER_SITE_OTHER): Payer: Medicare Other | Admitting: Family Medicine

## 2016-09-12 VITALS — BP 126/73 | HR 60 | Ht 64.0 in | Wt 151.1 lb

## 2016-09-12 DIAGNOSIS — I6529 Occlusion and stenosis of unspecified carotid artery: Secondary | ICD-10-CM | POA: Diagnosis not present

## 2016-09-12 DIAGNOSIS — Z5189 Encounter for other specified aftercare: Secondary | ICD-10-CM | POA: Diagnosis not present

## 2016-09-12 NOTE — Progress Notes (Signed)
Patient came into clinic today for suture removal from small laceration on right lower back. Pt reports this is the second set of stitches used to keep the area closed. There were 6 sutures removed, skin prepped and steri strips applied. PCP viewed laceration, reports it was healed well. Pt advised to follow up as needed.

## 2016-09-25 ENCOUNTER — Encounter: Payer: Self-pay | Admitting: Family Medicine

## 2016-09-25 ENCOUNTER — Ambulatory Visit (INDEPENDENT_AMBULATORY_CARE_PROVIDER_SITE_OTHER): Payer: Medicare Other | Admitting: Family Medicine

## 2016-09-25 VITALS — BP 119/68 | HR 64 | Temp 98.0°F | Wt 152.0 lb

## 2016-09-25 DIAGNOSIS — J029 Acute pharyngitis, unspecified: Secondary | ICD-10-CM | POA: Diagnosis not present

## 2016-09-25 DIAGNOSIS — R203 Hyperesthesia: Secondary | ICD-10-CM | POA: Diagnosis not present

## 2016-09-25 DIAGNOSIS — I6529 Occlusion and stenosis of unspecified carotid artery: Secondary | ICD-10-CM | POA: Diagnosis not present

## 2016-09-25 MED ORDER — CEFDINIR 300 MG PO CAPS: 300.0000 mg | ORAL_CAPSULE | Freq: Two times a day (BID) | ORAL | 0 refills | Status: DC

## 2016-09-25 NOTE — Patient Instructions (Signed)
Thank you for coming in today. Take omnicef antibiotic along with over the counter cold medicine.  Use over the counter lidocaine patches on the painful scar on the back.  Return as needed.    Upper Respiratory Infection, Adult Most upper respiratory infections (URIs) are caused by a virus. A URI affects the nose, throat, and upper air passages. The most common type of URI is often called "the common cold." Follow these instructions at home:  Take medicines only as told by your doctor.  Gargle warm saltwater or take cough drops to comfort your throat as told by your doctor.  Use a warm mist humidifier or inhale steam from a shower to increase air moisture. This may make it easier to breathe.  Drink enough fluid to keep your pee (urine) clear or pale yellow.  Eat soups and other clear broths.  Have a healthy diet.  Rest as needed.  Go back to work when your fever is gone or your doctor says it is okay.  You may need to stay home longer to avoid giving your URI to others.  You can also wear a face mask and wash your hands often to prevent spread of the virus.  Use your inhaler more if you have asthma.  Do not use any tobacco products, including cigarettes, chewing tobacco, or electronic cigarettes. If you need help quitting, ask your doctor. Contact a doctor if:  You are getting worse, not better.  Your symptoms are not helped by medicine.  You have chills.  You are getting more short of breath.  You have brown or red mucus.  You have yellow or brown discharge from your nose.  You have pain in your face, especially when you bend forward.  You have a fever.  You have puffy (swollen) neck glands.  You have pain while swallowing.  You have white areas in the back of your throat. Get help right away if:  You have very bad or constant:  Headache.  Ear pain.  Pain in your forehead, behind your eyes, and over your cheekbones (sinus pain).  Chest pain.  You  have long-lasting (chronic) lung disease and any of the following:  Wheezing.  Long-lasting cough.  Coughing up blood.  A change in your usual mucus.  You have a stiff neck.  You have changes in your:  Vision.  Hearing.  Thinking.  Mood. This information is not intended to replace advice given to you by your health care provider. Make sure you discuss any questions you have with your health care provider. Document Released: 11/20/2007 Document Revised: 02/04/2016 Document Reviewed: 09/08/2013 Elsevier Interactive Patient Education  2017 [employer].

## 2016-09-25 NOTE — Progress Notes (Signed)
Traci [name] is a 71 y.o. female who presents to [facility] [place] today for discuss congestion fevers and chills. Patient has a one-week history of nasal congestion sore throat fevers and chills. This is associated with some white bumps in the back of her throat. She notes her symptoms are currently consistent with previous episodes of bacterial pharyngitis. She has tried some over-the-counter medications which have not helped. She denies vomiting or diarrhea.  She does however note pain at the scar on her right low back from an excisional biopsy done recently. She notes the skin is sensitive and tender to touch.   Past Medical History:  Diagnosis Date  . Anxiety   . Atrial fibrillation ([place])   . CAD (coronary artery disease)   . Claudication ([place]) 01/06/2008   Lower extremity dopplers - no evidence of arterial insufficiency, normal exam  . Coronary artery disease   . GERD (gastroesophageal reflux disease)   . Hyperlipidemia   . Hypertension 11/30/2010   echo- EF 55%; normal w/ mildly sclerotic aortic valve  . Hypertension 11/05/11   renal dopplers - celiac artery and SMA >50% diameter reduction, R renal artery - mildly elevated velocities 1-59% diameter reduction, L renal artery normal  . Nonspecific ST-T wave electrocardiographic changes 03/26/2011   R/Lmv - EF 74%, normal perfusion all regions, ST depression w/ Lexiscan infusion w/o assoc angina  . Osteopenia   . PAD (peripheral artery disease) ([place])   . Peripheral vascular disease ([place])   . PVD (peripheral vascular disease) ([place]) 11/05/2011   doppler - R/L brachial pressures essentially equal w/o inflow disease; L sublclavian/CCA bypass graft demonstrates patent flow, no evidence of significant stenosis  . Sigmoid diverticulitis    Past Surgical History:  Procedure Laterality Date  . ABDOMINAL AORTIC  ANEURYSM REPAIR N/A 01/27/2014   Procedure: AORTO-SUPERIOR MESENTERIC ARTERY BYPASS GRAFT;  Surgeon: Angelia Mould, MD;  Location: [facility];  Service: Vascular;  Laterality: N/A;  . ABDOMINAL HYSTERECTOMY    . APPENDECTOMY    . CARDIAC CATHETERIZATION  06/03/2008   60% LAD, involving D2, borderline significant by IVUS, medical therapy. CFX, RCA OK.  . CHOLECYSTECTOMY    . EYE SURGERY     retinal surgery  . SPINE SURGERY  Feb. 2011  . [place]   X's  several  . SUBCLAVIAN ARTERY STENT Left 09/20/2008   LSA ISR 7x37mm Cordis Genesis on Opta premount, reduction from 80% to 0%  . subclavian artery stents  01/23/2010   Left carotid to subclavian artery Bypass  . VISCERAL ANGIOGRAM  01/17/2014   Procedure: VISCERAL ANGIOGRAM;  Surgeon: Angelia Mould, MD;  Location: [facility] CATH LAB;  Service: Cardiovascular;;   Social History  Substance Use Topics  . Smoking status: Former Smoker    Packs/day: 0.25    Years: 20.00    Types: Cigarettes    Quit date: 09/30/1993  . Smokeless tobacco: Never Used  . Alcohol use No   family history includes Diabetes in her maternal grandmother; Heart attack in her father; Heart disease in her paternal grandfather; Heart disease (age of onset: 40) in her father; Hyperlipidemia in her father; Hypertension in her father; Kidney disease in her father; Kidney failure in her mother.  ROS as above:  Medications: Current Outpatient Prescriptions  Medication Sig Dispense Refill  . aspirin EC 81 MG tablet Take 81 mg by mouth daily.    . ATACAND 16 MG  tablet TAKE ONE-HALF (1/2) TABLET EVERY MORNING 45 tablet 3  . atenolol (TENORMIN) 25 MG tablet Take 1 tablet (25 mg total) by mouth daily. 90 tablet 3  . atorvastatin (LIPITOR) 10 MG tablet TAKE 1 TABLET EVERY EVENING 90 tablet 3  . cholecalciferol (VITAMIN D) 1000 UNITS tablet Take 1,000 Units by mouth daily.    . clopidogrel (PLAVIX) 75 MG tablet TAKE 1 TABLET DAILY 90 tablet 3  . clorazepate  (TRANXENE) 7.5 MG tablet Take 1 tablet (7.5 mg total) by mouth daily as needed. 90 tablet 1  . cyanocobalamin 1000 MCG tablet Take 100 mcg by mouth daily.    Marland Kitchen EPINEPHrine (EPIPEN 2-PAK) 0.3 mg/0.3 mL IJ SOAJ injection Inject 0.3 mLs (0.3 mg total) into the muscle as needed (for allergic reaction). 2 Device 0  . esomeprazole (NEXIUM) 40 MG capsule Take 1 capsule (40 mg total) by mouth daily. Take 1 tab daily (Patient taking differently: Take 40 mg by mouth daily. ) 90 capsule 0  . estradiol (ESTRACE) 0.5 MG tablet Take 1 tablet (0.5 mg total) by mouth daily. 90 tablet 1  . furosemide (LASIX) 20 MG tablet TAKE 1 TABLET EVERY OTHER DAY 45 tablet 3  . ipratropium (ATROVENT) 0.06 % nasal spray Place 2 sprays into both nostrils every 4 (four) hours as needed for rhinitis. 10 mL 6  . linaclotide (LINZESS) 145 MCG CAPS capsule Take 1 capsule by mouth daily as needed.    . meclizine (ANTIVERT) 25 MG tablet Take 0.5 tablets (12.5 mg total) by mouth 3 (three) times daily as needed for dizziness or nausea. (Patient taking differently: Take 12.5 mg by mouth daily. ) 90 tablet 1  . Multiple Vitamin (MULTIVITAMIN WITH MINERALS) TABS Take 1 tablet by mouth daily.    Marland Kitchen nystatin (MYCOSTATIN) 100000 UNIT/ML suspension Take 5 mLs (500,000 Units total) by mouth 4 (four) times daily. Swish for 30 seconds and swallow. 14 days 180 mL 1  . ondansetron (ZOFRAN) 4 MG tablet Take 1 tablet (4 mg total) by mouth every 8 (eight) hours as needed for nausea or vomiting. 12 tablet 0  . polyethylene glycol (MIRALAX / GLYCOLAX) packet Take 17 g by mouth 2 (two) times daily. Until stooling regularly (Patient taking differently: Take 8.5 g by mouth daily. ) 30 packet 11  . zolpidem (AMBIEN) 10 MG tablet Take 1 tablet (10 mg total) by mouth at bedtime as needed for sleep. 90 tablet 0  . cefdinir (OMNICEF) 300 MG capsule Take 1 capsule (300 mg total) by mouth 2 (two) times daily. 14 capsule 0   No current facility-administered  medications for this visit.    Allergies  Allergen Reactions  . Cortisone Anaphylaxis  . Dilaudid [Hydromorphone Hcl] Nausea And Vomiting    Pt states she will start vomiting immediately for 6 hours  . Iodine Anaphylaxis  . Medrol [Methylprednisolone] Anaphylaxis  . Omnipaque [Iohexol] Anaphylaxis  . Prednisone Anaphylaxis  . Shellfish Allergy Anaphylaxis  . Sulfa Drugs Cross Reactors Anaphylaxis  . Augmentin [Amoxicillin-Pot Clavulanate]     Upset stomach  . Morphine And Related     nausea  . Codeine Rash  . Erythromycin Rash    Health Maintenance Health Maintenance  Topic Date Due  . INFLUENZA VACCINE  01/15/2017  . MAMMOGRAM  11/21/2017  . TETANUS/TDAP  06/17/2020  . COLONOSCOPY  07/16/2025  . DEXA SCAN  Completed  . Hepatitis C Screening  Completed  . PNA vac Low Risk Adult  Completed     Exam:  BP 119/68   Pulse 64   Temp 98 F (36.7 C) (Oral)   Wt 152 lb (68.9 kg)   SpO2 99%   BMI 26.09 kg/m  Gen: Well NAD HEENT: EOMI,  MMM Posterior pharynx is erythematous with no obvious exudate. Nontender sinuses bilaterally. Mild left-sided cervical lymphadenopathy present. Normal neck motion. Slight soft swelling at the left lateral neck without fluctuance or induration. Nontender. Clear nasal discharge present. Lungs: Normal work of breathing. CTABL Heart: RRR no MRG Abd: NABS, Soft. Nondistended, Nontender Exts: Brisk capillary refill, warm and well perfused.  Skin: Right low back well-appearing incisional scar. No erythema or induration or fluctuance. No drainage. Sensitive to touch.   No results found for this or any previous visit (from the past 72 hour(s)). No results found.    Assessment and Plan: 71 y.o. female with  Pharyngitis likely. She with antibiotics as patient has done well with this in the past. Continue over-the-counter medications. Watchful waiting.  Since the skin following excisional biopsy. No evidence of infection today. Treatment with  lidocaine patch. Return as needed.   No orders of the defined types were placed in this encounter.  Meds ordered this encounter  Medications  . cefdinir (OMNICEF) 300 MG capsule    Sig: Take 1 capsule (300 mg total) by mouth 2 (two) times daily.    Dispense:  14 capsule    Refill:  0     Discussed warning signs or symptoms. Please see discharge instructions. Patient expresses understanding.

## 2016-09-28 ENCOUNTER — Other Ambulatory Visit: Payer: Self-pay | Admitting: Cardiovascular Disease

## 2016-09-30 ENCOUNTER — Ambulatory Visit (INDEPENDENT_AMBULATORY_CARE_PROVIDER_SITE_OTHER): Payer: Medicare Other | Admitting: Physical Therapy

## 2016-09-30 DIAGNOSIS — R29898 Other symptoms and signs involving the musculoskeletal system: Secondary | ICD-10-CM | POA: Diagnosis not present

## 2016-09-30 DIAGNOSIS — R293 Abnormal posture: Secondary | ICD-10-CM | POA: Diagnosis not present

## 2016-09-30 DIAGNOSIS — M6281 Muscle weakness (generalized): Secondary | ICD-10-CM

## 2016-09-30 DIAGNOSIS — M5416 Radiculopathy, lumbar region: Secondary | ICD-10-CM

## 2016-09-30 NOTE — Therapy (Addendum)
[facility] [place], [place], [address] Phone: [phone]   Fax:  [phone]  Physical Therapy Treatment  Patient Details  Name: Traci [name] MRN: [id number] Date of Birth: December 27, 1945 Referring Provider: Dr Steva Colder  Encounter Date: 09/30/2016      PT End of Session - 09/30/16 1601    Visit Number 8   Number of Visits 19   Date for PT Re-Evaluation 11/11/16   Authorization Time Period G-code completed at 8th visit   PT Start Time 1601   PT Stop Time 1656   PT Time Calculation (min) 55 min   Activity Tolerance Patient tolerated treatment well      Past Medical History:  Diagnosis Date  . Anxiety   . Atrial fibrillation ([place])   . CAD (coronary artery disease)   . Claudication ([place]) 01/06/2008   Lower extremity dopplers - no evidence of arterial insufficiency, normal exam  . Coronary artery disease   . GERD (gastroesophageal reflux disease)   . Hyperlipidemia   . Hypertension 11/30/2010   echo- EF 55%; normal w/ mildly sclerotic aortic valve  . Hypertension 11/05/11   renal dopplers - celiac artery and SMA >50% diameter reduction, R renal artery - mildly elevated velocities 1-59% diameter reduction, L renal artery normal  . Nonspecific ST-T wave electrocardiographic changes 03/26/2011   R/Lmv - EF 74%, normal perfusion all regions, ST depression w/ Lexiscan infusion w/o assoc angina  . Osteopenia   . PAD (peripheral artery disease) ([place])   . Peripheral vascular disease ([place])   . PVD (peripheral vascular disease) ([place]) 11/05/2011   doppler - R/L brachial pressures essentially equal w/o inflow disease; L sublclavian/CCA bypass graft demonstrates patent flow, no evidence of significant stenosis  . Sigmoid diverticulitis     Past Surgical History:  Procedure Laterality Date  . ABDOMINAL AORTIC ANEURYSM REPAIR N/A 01/27/2014   Procedure: AORTO-SUPERIOR MESENTERIC ARTERY BYPASS GRAFT;  Surgeon:  Angelia Mould, MD;  Location: [facility];  Service: Vascular;  Laterality: N/A;  . ABDOMINAL HYSTERECTOMY    . APPENDECTOMY    . CARDIAC CATHETERIZATION  06/03/2008   60% LAD, involving D2, borderline significant by IVUS, medical therapy. CFX, RCA OK.  . CHOLECYSTECTOMY    . EYE SURGERY     retinal surgery  . SPINE SURGERY  Feb. 2011  . [place]   X's  several  . SUBCLAVIAN ARTERY STENT Left 09/20/2008   LSA ISR 7x41m Cordis Genesis on Opta premount, reduction from 80% to 0%  . subclavian artery stents  01/23/2010   Left carotid to subclavian artery Bypass  . VISCERAL ANGIOGRAM  01/17/2014   Procedure: VISCERAL ANGIOGRAM;  Surgeon: CAngelia Mould MD;  [facility];  Service: Cardiovascular;;    There were no vitals filed for this visit.      Subjective Assessment - 09/30/16 1607    Subjective Ready to restart PT, intested in trying DN however also very nervous about it.    Patient Stated Goals get pain better so she can move more    Currently in Pain? Yes   Pain Score 8    Pain Location Back   Pain Orientation Left;Right   Pain Descriptors / Indicators Throbbing   Pain Type Chronic pain   Pain Onset More than a month ago   Pain Frequency Constant   Aggravating Factors  transitioning to a stand   Pain Relieving Factors lidocaine patch  [facility] PT Assessment - 09/30/16 0001      Assessment   Medical Diagnosis Lt lumbar radiculopathy    Referring Provider Dr Steva Colder   Onset Date/Surgical Date 04/17/16   Hand Dominance Right   Next MD Visit PRN     Observation/Other Assessments   Focus on Therapeutic Outcomes (FOTO)  48% limited     ROM / Strength   AROM / PROM / Strength AROM     AROM   Lumbar Flexion to mid shin with pain   Lumbar Extension decreased 90% with pain   Lumbar - Right Rotation limited 75% with pain   Lumbar - Left Rotation limited 50% with pain     Strength   Right/Left Hip Left;Right   Right Hip  Flexion 5/5   Right Hip Extension --  5-/5   Right Hip ABduction 5/5   Left Hip Flexion 4+/5   Left Hip Extension 3-/5  with pain   Left Hip ABduction 4/5                     [facility] Adult PT Treatment/Exercise - 09/30/16 0001      Modalities   Modalities Electrical Stimulation;Moist Heat     Moist Heat Therapy   Number Minutes Moist Heat 15 Minutes   Moist Heat Location Lumbar Spine  bilat buttocks     Electrical Stimulation   Electrical Stimulation Location bilat lumbar paraspinals/ glutes  and buttocks   Electrical Stimulation Action IFC   Electrical Stimulation Parameters  to tolerance   Electrical Stimulation Goals Pain;Tone     Manual Therapy   Soft tissue mobilization STM to bilat gluts and piriformis, lumbar paraspinals          Trigger Point Dry Needling - 09/30/16 1622    Consent Given? Yes   Education Handout Provided Yes   Muscles Treated Upper Body Longissimus   Muscles Treated Lower Body Gluteus maximus;Piriformis   Longissimus Response Palpable increased muscle length;Twitch response elicited  bilat with stim   Gluteus Maximus Response Palpable increased muscle length;Twitch response elicited  bilat   Piriformis Response Palpable increased muscle length;Twitch response elicited  bilat                   PT Long Term Goals - 09/30/16 1603      PT LONG TERM GOAL #1   Title Improve posture and alignment with resolution of Rt lateral shift 11/11/16   Time 6   Status On-going     PT LONG TERM GOAL #2   Title Increase Lt LE strength to 5-/5 to 5/5 as neurological symptoms allow 11/11/16   Time 6   Period Weeks   Status Partially Met     PT LONG TERM GOAL #3   Title Improve sitting and standing tolerance to 45-60 minutes to increase functional activity level 11/11/16   Time 6   Period Weeks     PT LONG TERM GOAL #4   Title Independent in HEP 11/11/16   Time 6   Period Weeks   Status On-going     PT LONG TERM GOAL #5    Title Improve FOTO to </= 34% limitation 09/23/16   Time 6   Period Weeks   Status On-going  currently 48% limited               Plan - 09/30/16 1602    Clinical Impression Statement Pt is able to return for therapy now after having a  mole removed from her back and the incision opened up a couple times.  It has healed and she can now participate in PT. She has increased weakness in her Lt hip, signficant tightness in her lumbar paraspinals, gluts and pirifomis bilaterally.  she would benefit with returning to therapy.    Rehab Potential Good   Clinical Impairments Affecting Rehab Potential G-code completed today   PT Frequency 2x / week   PT Duration 6 weeks   PT Treatment/Interventions Patient/family education;ADLs/Self Care Home Management;Cryotherapy;Electrical Stimulation;Iontophoresis 60m/ml Dexamethasone;Moist Heat;Traction;Ultrasound;Dry needling;Manual techniques;Therapeutic activities;Therapeutic exercise;Taping;Neuromuscular re-education   PT Next Visit Plan assess response to DN, cont with therapy.    Consulted and Agree with Plan of Care Patient      Patient will benefit from skilled therapeutic intervention in order to improve the following deficits and impairments:  Postural dysfunction, Improper body mechanics, Pain, Increased muscle spasms, Decreased range of motion, Decreased strength, Decreased activity tolerance  Visit Diagnosis: Lumbar radiculopathy - Plan: PT plan of care cert/re-cert  Muscle weakness (generalized) - Plan: PT plan of care cert/re-cert  Abnormal posture - Plan: PT plan of care cert/re-cert  Other symptoms and signs involving the musculoskeletal system - Plan: PT plan of care cert/re-cert     Problem List Patient Active Problem List   Diagnosis Date Noted  . Protein-calorie malnutrition ([place] 08/29/2016  . Atypical nevus 08/22/2016  . Acute bilateral low back pain with left-sided sciatica 06/06/2016  . Osteopenia 12/13/2015  .  Menopause 12/08/2015  . History of colonoscopy 07/17/2015  . Irregular heartbeat 07/26/2014  . 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 Preoperative evaluation of a medical condition to rule out surgical contraindications (TAR required) 01/17/2014  . 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[place] 01/16/2014  . Hyperlipidemia 04/05/2013  . Pre-syncope 12/06/2012  . Hypertension 12/06/2012  . Peripheral arterial disease ([place] 12/06/2012  . Coronary artery disease, non-occlusive, last cath 2009 12/06/2012  . Hyponatremia, improved holding diuretic 12/06/2012  . Atherosclerosis of other specified arteries 04/02/2012  . Subclavian arterial stenosis ([place] 04/02/2012  . Stricture of artery ([place] 10/02/2011  . Subclavian artery stenosis, left ([place] 10/02/2011    SJeral PinchPT  09/30/2016, 4:52 PM  [facility] [address] [address]Phone: [phone]  Fax:  [phone] Name: DTANEIA MEALORMRN: [id number] of Birth: 1May 30, 1947 PHYSICAL THERAPY DISCHARGE SUMMARY  Visits from Start of Care: 8  Current functional level related to goals / functional outcomes: See last progress note for discharge status.   Remaining deficits: Unknown    Education / Equipment: HEP Plan: Patient agrees to discharge.  Patient goals were not met. Patient is being discharged due to not returning since the last visit.  ?????     Will be referred to neurosurgeon   Celyn P. HHelene KelpPT, MPH 10/31/16 10:11 AM

## 2016-09-30 NOTE — Patient Instructions (Signed)

## 2016-10-07 ENCOUNTER — Other Ambulatory Visit: Payer: Self-pay | Admitting: Family Medicine

## 2016-10-07 DIAGNOSIS — Z1231 Encounter for screening mammogram for malignant neoplasm of breast: Secondary | ICD-10-CM

## 2016-10-08 ENCOUNTER — Encounter (HOSPITAL_BASED_OUTPATIENT_CLINIC_OR_DEPARTMENT_OTHER): Payer: Self-pay | Admitting: *Deleted

## 2016-10-08 ENCOUNTER — Encounter: Payer: Medicare Other | Admitting: Physical Therapy

## 2016-10-08 ENCOUNTER — Emergency Department (HOSPITAL_BASED_OUTPATIENT_CLINIC_OR_DEPARTMENT_OTHER)
Admission: EM | Admit: 2016-10-08 | Discharge: 2016-10-08 | Disposition: A | Discharge status: Home / Self Care | Payer: Medicare Other | Attending: Emergency Medicine | Admitting: Emergency Medicine

## 2016-10-08 DIAGNOSIS — M545 Low back pain, unspecified: Secondary | ICD-10-CM

## 2016-10-08 DIAGNOSIS — I10 Essential (primary) hypertension: Secondary | ICD-10-CM | POA: Diagnosis not present

## 2016-10-08 DIAGNOSIS — Z87891 Personal history of nicotine dependence: Secondary | ICD-10-CM | POA: Diagnosis not present

## 2016-10-08 DIAGNOSIS — G8929 Other chronic pain: Secondary | ICD-10-CM

## 2016-10-08 DIAGNOSIS — Z7982 Long term (current) use of aspirin: Secondary | ICD-10-CM | POA: Insufficient documentation

## 2016-10-08 DIAGNOSIS — G629 Polyneuropathy, unspecified: Secondary | ICD-10-CM

## 2016-10-08 DIAGNOSIS — Z79899 Other long term (current) drug therapy: Secondary | ICD-10-CM | POA: Insufficient documentation

## 2016-10-08 DIAGNOSIS — I2510 Atherosclerotic heart disease of native coronary artery without angina pectoris: Secondary | ICD-10-CM | POA: Diagnosis not present

## 2016-10-08 LAB — COMPREHENSIVE METABOLIC PANEL
ALT: 22 U/L (ref 14–54)
AST: 31 U/L (ref 15–41)
Albumin: 3.7 g/dL (ref 3.5–5.0)
Alkaline Phosphatase: 50 U/L (ref 38–126)
Anion gap: 10 (ref 5–15)
BUN: 16 mg/dL (ref 6–20)
CHLORIDE: 103 mmol/L (ref 101–111)
CO2: 26 mmol/L (ref 22–32)
CREATININE: 0.91 mg/dL (ref 0.44–1.00)
Calcium: 8.6 mg/dL — ABNORMAL LOW (ref 8.9–10.3)
GFR calc Af Amer: >60 mL/min (ref >60)
GFR calc non Af Amer: >60 mL/min (ref >60)
Glucose, Bld: 104 mg/dL — ABNORMAL HIGH (ref 65–99)
Potassium: 4.1 mmol/L (ref 3.5–5.1)
SODIUM: 139 mmol/L (ref 135–145)
Total Bilirubin: 0.7 mg/dL (ref 0.3–1.2)
Total Protein: 7.2 g/dL (ref 6.5–8.1)

## 2016-10-08 LAB — CBC WITH DIFFERENTIAL/PLATELET
BASOS ABS: 0 10*3/uL (ref 0.0–0.1)
BASOS PCT: 0 %
Eosinophils Absolute: 0.5 10*3/uL (ref 0.0–0.7)
Eosinophils Relative: 11 %
HCT: 38.9 % (ref 36.0–46.0)
HEMOGLOBIN: 12.7 g/dL (ref 12.0–15.0)
Lymphocytes Relative: 36 %
Lymphs Abs: 1.7 10*3/uL (ref 0.7–4.0)
MCH: 29.1 pg (ref 26.0–34.0)
MCHC: 32.6 g/dL (ref 30.0–36.0)
MCV: 89.2 fL (ref 78.0–100.0)
Monocytes Absolute: 0.4 10*3/uL (ref 0.1–1.0)
Monocytes Relative: 8 %
NEUTROS PCT: 45 %
Neutro Abs: 2.1 10*3/uL (ref 1.7–7.7)
PLATELETS: 279 10*3/uL (ref 150–400)
RBC: 4.36 MIL/uL (ref 3.87–5.11)
RDW: 12.4 % (ref 11.5–15.5)
WBC: 4.8 10*3/uL (ref 4.0–10.5)

## 2016-10-08 LAB — URINALYSIS, ROUTINE W REFLEX MICROSCOPIC
BILIRUBIN URINE: NEGATIVE
Glucose, UA: NEGATIVE mg/dL
Hgb urine dipstick: NEGATIVE
KETONES UR: NEGATIVE mg/dL
NITRITE: NEGATIVE
PROTEIN: NEGATIVE mg/dL
Specific Gravity, Urine: 1.018 (ref 1.005–1.030)
pH: 6 (ref 5.0–8.0)

## 2016-10-08 LAB — URINALYSIS, MICROSCOPIC (REFLEX)

## 2016-10-08 MED ORDER — GABAPENTIN 300 MG PO CAPS: 300.0000 mg | ORAL_CAPSULE | Freq: Three times a day (TID) | ORAL | 0 refills | Status: DC

## 2016-10-08 MED ORDER — LIDOCAINE 5 % EX PTCH: 1.0000 | MEDICATED_PATCH | CUTANEOUS | 0 refills | Status: DC

## 2016-10-08 MED FILL — GABAPENTIN 300 MG CAPSULE: 300 | 10 days supply | Qty: 30 | Fill #0

## 2016-10-08 MED FILL — LIDOCAINE PATCH 5%: 5 | 30 days supply | Qty: 30 | Fill #0

## 2016-10-08 NOTE — ED Triage Notes (Signed)
Mid back pain into her hips for 5 days. States each day it gets worse. Pain makes her feel like her legs are not going to hold her up. States she is taking PT for her back pain. Last week she had "dry needling" and she thinks that caused her pain.

## 2016-10-08 NOTE — Discharge Instructions (Signed)
Neurontin at night. Re check with your physician.

## 2016-10-08 NOTE — ED Provider Notes (Signed)
[facility] Provider Note   CSN: [id number] Arrival date & time: 10/08/16  1217     History   Chief Complaint Chief Complaint  Patient presents with  . Abdominal Pain    HPI Traci [name] is a 71 y.o. female.  HPI patient presents evaluation of exacerbation of chronic back pain. Had MRI showing facet disease but no marked central disc herniation or any abnormal is amenable to surgical treatment.  Has been getting physical therapy. She underwent some dry needling treatment with physical therapy as well as some [place] temp. She feels like this "flared things up" she describes pain from her back radiating around to her anterior abdomen and "up and down my spine". No numbness weakness or tingling to the lower extremities. States like her pelvis feels "like it will hopefully sometimes". She states she felt warm. She admits that she did not check a temperature but thought he "might of been over 100". No unusual abdominal complaints. Has history of chronic abdominal discomfort in IBS symptoms. Occasional lens S. Occasional laxatives. Last bowel movement was yesterday and regular. No urinary symptoms.   MRI 06/2016:  IMPRESSION: 1. Mild disc bulge and facet disease at L4-5 with resultant mild bilateral lateral recess stenosis, slightly greater on the left. 2. Status post ALIF at V8-L3 without complication. Mild bilateral foraminal stenosis at this level related to facet disease and endplate osteophytic spurring. 3. Minimal degenerative disc bulging at L2-3 and L3-4 without stenosis or neural impingement  Past Medical History:  Diagnosis Date  . Anxiety   . Atrial fibrillation ([place])   . CAD (coronary artery disease)   . Claudication ([place]) 01/06/2008   Lower extremity dopplers - no evidence of arterial insufficiency, normal exam  . Coronary artery disease   . GERD (gastroesophageal reflux disease)   . Hyperlipidemia   . Hypertension 11/30/2010   echo- EF 55%;  normal w/ mildly sclerotic aortic valve  . Hypertension 11/05/11   renal dopplers - celiac artery and SMA >50% diameter reduction, R renal artery - mildly elevated velocities 1-59% diameter reduction, L renal artery normal  . Nonspecific ST-T wave electrocardiographic changes 03/26/2011   R/Lmv - EF 74%, normal perfusion all regions, ST depression w/ Lexiscan infusion w/o assoc angina  . Osteopenia   . PAD (peripheral artery disease) ([place])   . Peripheral vascular disease ([place])   . PVD (peripheral vascular disease) ([place]) 11/05/2011   doppler - R/L brachial pressures essentially equal w/o inflow disease; L sublclavian/CCA bypass graft demonstrates patent flow, no evidence of significant stenosis  . Sigmoid diverticulitis     Patient Active Problem List   Diagnosis Date Noted  . Protein-calorie malnutrition ([place]) 08/29/2016  . Atypical nevus 08/22/2016  . Acute bilateral low back pain with left-sided sciatica 06/06/2016  . Osteopenia 12/13/2015  . Menopause 12/08/2015  . History of colonoscopy 07/17/2015  . Irregular heartbeat 07/26/2014  . 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 Preoperative evaluation of a medical condition to rule out surgical contraindications (TAR required) 01/17/2014  . 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[place]) 01/16/2014  . Hyperlipidemia 04/05/2013  . Pre-syncope 12/06/2012  . Hypertension 12/06/2012  . Peripheral arterial disease ([place]) 12/06/2012  . Coronary artery disease, non-occlusive, last cath 2009 12/06/2012  . Hyponatremia, improved holding diuretic 12/06/2012  . Atherosclerosis of other specified arteries 04/02/2012  . 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 Stricture of artery ([place]) 10/02/2011  . Subclavian artery stenosis, left ([place]) 10/02/2011    Past Surgical History:  Procedure Laterality  Date  . ABDOMINAL AORTIC ANEURYSM REPAIR N/A 01/27/2014   Procedure: AORTO-SUPERIOR MESENTERIC ARTERY BYPASS GRAFT;  Surgeon: Angelia Mould, MD;  Location:  [facility];  Service: Vascular;  Laterality: N/A;  . ABDOMINAL HYSTERECTOMY    . APPENDECTOMY    . CARDIAC CATHETERIZATION  06/03/2008   60% LAD, involving D2, borderline significant by IVUS, medical therapy. CFX, RCA OK.  . CHOLECYSTECTOMY    . EYE SURGERY     retinal surgery  . SPINE SURGERY  Feb. 2011  . [place]   X's  several  . SUBCLAVIAN ARTERY STENT Left 09/20/2008   LSA ISR 7x58mm Cordis Genesis on Opta premount, reduction from 80% to 0%  . subclavian artery stents  01/23/2010   Left carotid to subclavian artery Bypass  . VISCERAL ANGIOGRAM  01/17/2014   Procedure: VISCERAL ANGIOGRAM;  Surgeon: Angelia Mould, MD;  Location: [facility] CATH LAB;  Service: Cardiovascular;;    OB History    No data available       Home Medications    Prior to Admission medications   Medication Sig Start Date End Date Taking? Authorizing Provider  aspirin EC 81 MG tablet Take 81 mg by mouth daily.    Historical Provider, MD  ATACAND 16 MG tablet TAKE ONE-HALF (1/2) TABLET EVERY MORNING 10/31/15   Mihai Croitoru, MD  atenolol (TENORMIN) 25 MG tablet Take 1 tablet (25 mg total) by mouth daily. 02/08/16   Mihai Croitoru, MD  atorvastatin (LIPITOR) 10 MG tablet TAKE 1 TABLET EVERY EVENING 10/31/15   Mihai Croitoru, MD  cefdinir (OMNICEF) 300 MG capsule Take 1 capsule (300 mg total) by mouth 2 (two) times daily. 09/25/16   Gregor Hams, MD  cholecalciferol (VITAMIN D) 1000 UNITS tablet Take 1,000 Units by mouth daily.    Historical Provider, MD  clopidogrel (PLAVIX) 75 MG tablet TAKE 1 TABLET DAILY 10/31/15   Mihai Croitoru, MD  clorazepate (TRANXENE) 7.5 MG tablet Take 1 tablet (7.5 mg total) by mouth daily as needed. 05/01/16   Gregor Hams, MD  cyanocobalamin 1000 MCG tablet Take 100 mcg by mouth daily.    Historical Provider, MD  EPINEPHrine (EPIPEN 2-PAK) 0.3 mg/0.3 mL IJ SOAJ injection Inject 0.3 mLs (0.3 mg total) into the muscle as needed (for allergic reaction). 10/02/15   Sean  Hommel, DO  esomeprazole (NEXIUM) 40 MG capsule Take 1 capsule (40 mg total) by mouth daily. Take 1 tab daily Patient taking differently: Take 40 mg by mouth daily.  02/23/15   Mihai Croitoru, MD  estradiol (ESTRACE) 0.5 MG tablet Take 1 tablet (0.5 mg total) by mouth daily. 06/26/16   Gregor Hams, MD  furosemide (LASIX) 20 MG tablet TAKE 1 TABLET EVERY OTHER DAY 09/30/16   Mihai Croitoru, MD  gabapentin (NEURONTIN) 300 MG capsule Take 1 capsule (300 mg total) by mouth 3 (three) times daily. 10/08/16   Tanna Furry, MD  ipratropium (ATROVENT) 0.06 % nasal spray Place 2 sprays into both nostrils every 4 (four) hours as needed for rhinitis. 06/06/16   Gregor Hams, MD  linaclotide [facility]) 145 MCG CAPS capsule Take 1 capsule by mouth daily as needed. 12/22/15   Historical Provider, MD  meclizine (ANTIVERT) 25 MG tablet Take 0.5 tablets (12.5 mg total) by mouth 3 (three) times daily as needed for dizziness or nausea. Patient taking differently: Take 12.5 mg by mouth daily.  05/01/16   Gregor Hams, MD  Multiple Vitamin (MULTIVITAMIN WITH MINERALS) TABS  Take 1 tablet by mouth daily.    Historical Provider, MD  nystatin (MYCOSTATIN) 100000 UNIT/ML suspension Take 5 mLs (500,000 Units total) by mouth 4 (four) times daily. Swish for 30 seconds and swallow. 14 days 07/10/16   Gregor Hams, MD  ondansetron (ZOFRAN) 4 MG tablet Take 1 tablet (4 mg total) by mouth every 8 (eight) hours as needed for nausea or vomiting. 08/05/16   Noland Fordyce, PA-C  polyethylene glycol (MIRALAX / GLYCOLAX) packet Take 17 g by mouth 2 (two) times daily. Until stooling regularly Patient taking differently: Take 8.5 g by mouth daily.  11/09/14   Silverio Decamp, MD  zolpidem (AMBIEN) 10 MG tablet Take 1 tablet (10 mg total) by mouth at bedtime as needed for sleep. 05/01/16   Gregor Hams, MD    Family History Family History  Problem Relation Age of Onset  . Heart disease Father 72    Heart Disease before age 54  . Kidney  disease Father   . Heart attack Father   . Hyperlipidemia Father   . Hypertension Father   . Kidney failure Mother   . Diabetes Maternal Grandmother   . Heart disease Paternal Grandfather     Social History Social History  Substance Use Topics  . Smoking status: Former Smoker    Packs/day: 0.25    Years: 20.00    Types: Cigarettes    Quit date: 09/30/1993  . Smokeless tobacco: Never Used  . Alcohol use No     Allergies   Cortisone; Dilaudid [hydromorphone hcl]; Iodine; Medrol [methylprednisolone]; Omnipaque [iohexol]; Prednisone; Shellfish allergy; Sulfa drugs cross reactors; Augmentin [amoxicillin-pot clavulanate]; Morphine and related; Codeine; and Erythromycin   Review of Systems Review of Systems  Constitutional: Negative for appetite change, chills, diaphoresis, fatigue and fever.  HENT: Negative for mouth sores, sore throat and trouble swallowing.   Eyes: Negative for visual disturbance.  Respiratory: Negative for cough, chest tightness, shortness of breath and wheezing.   Cardiovascular: Negative for chest pain.  Gastrointestinal: Positive for abdominal pain. Negative for abdominal distention, diarrhea, nausea and vomiting.  Endocrine: Negative for polydipsia, polyphagia and polyuria.  Genitourinary: Negative for dysuria, frequency and hematuria.  Musculoskeletal: Positive for back pain. Negative for gait problem.  Skin: Negative for color change, pallor and rash.  Neurological: Negative for dizziness, syncope, light-headedness and headaches.  Hematological: Does not bruise/bleed easily.  Psychiatric/Behavioral: Negative for behavioral problems and confusion.     Physical Exam Updated Vital Signs BP 124/69   Pulse 87   Temp 98.2 F (36.8 C) (Oral)   Resp 20   Ht 5\' 4"  (1.626 m)   Wt 149 lb (67.6 kg)   SpO2 100%   BMI 25.58 kg/m   Physical Exam  Constitutional: She is oriented to person, place, and time. She appears well-developed and well-nourished. No  distress.  HENT:  Head: Normocephalic.  Eyes: Conjunctivae are normal. Pupils are equal, round, and reactive to light. No scleral icterus.  Neck: Normal range of motion. Neck supple. No thyromegaly present.  Cardiovascular: Normal rate and regular rhythm.  Exam reveals no gallop and no friction rub.   No murmur heard. Pulmonary/Chest: Effort normal and breath sounds normal. No respiratory distress. She has no wheezes. She has no rales.  Abdominal: Soft. Bowel sounds are normal. She exhibits no distension. There is no tenderness. There is no rebound.  Soft benign abdomen. No rebound or peritoneal irritation. No abnormalities of the skin. No rash or vesicles.  Musculoskeletal: Normal range  of motion.  Sites of her try needling needle placement appeared normal no bruising ecchymosis or infection. No midline tenderness.  Neurological: She is alert and oriented to person, place, and time.  Normal symmetric Strength to shoulder shrug, triceps, biceps, grip,wrist flex/extend,and intrinsics  Norma lsymmetric sensation above and below clavicles, and to all distributions to UEs. Norma symmetric strength to flex/.extend hip and knees, dorsi/plantar flex ankles. Normal symmetric sensation to all distributions to LEs Patellar and achilles reflexes 1-2+. Downgoing Babinski   Skin: Skin is warm and dry. No rash noted.  Psychiatric: She has a normal mood and affect. Her behavior is normal.     ED Treatments / Results  Labs (all labs ordered are listed, but only abnormal results are displayed) Labs Reviewed  URINALYSIS, ROUTINE W REFLEX MICROSCOPIC - Abnormal; Notable for the following:       Result Value   Leukocytes, UA SMALL (*)    All other components within normal limits  COMPREHENSIVE METABOLIC PANEL - Abnormal; Notable for the following:    Glucose, Bld 104 (*)    Calcium 8.6 (*)    All other components within normal limits  URINALYSIS, MICROSCOPIC (REFLEX) - Abnormal; Notable for the  following:    Bacteria, UA RARE (*)    Squamous Epithelial / LPF 0-5 (*)    All other components within normal limits  CBC WITH DIFFERENTIAL/PLATELET    EKG  EKG Interpretation None       Radiology No results found.  Procedures Procedures (including critical care time)  Medications Ordered in ED Medications - No data to display   Initial Impression / Assessment and Plan / ED Course  I have reviewed the triage vital signs and the nursing notes.  Pertinent labs & imaging results that were available during my care of the patient were reviewed by me and considered in my medical decision making (see chart for details).     Normal UA and CBC. Will add neuronin qhs.  No sign of acute abdominal tenderness on exam. No leukocytosis. Reassuring neuro exam  Final Clinical Impressions(s) / ED Diagnoses   Final diagnoses:  Chronic bilateral low back pain without sciatica  Neuropathy    New Prescriptions New Prescriptions   GABAPENTIN (NEURONTIN) 300 MG CAPSULE    Take 1 capsule (300 mg total) by mouth 3 (three) times daily.     Tanna Furry, MD 10/08/16 1459

## 2016-10-22 DIAGNOSIS — K580 Irritable bowel syndrome with diarrhea: Secondary | ICD-10-CM | POA: Diagnosis not present

## 2016-10-22 DIAGNOSIS — I10 Essential (primary) hypertension: Secondary | ICD-10-CM | POA: Diagnosis not present

## 2016-10-26 ENCOUNTER — Other Ambulatory Visit: Payer: Self-pay | Admitting: Cardiovascular Disease

## 2016-10-28 NOTE — Telephone Encounter (Signed)
Rx(s) sent to pharmacy electronically.  

## 2016-11-13 ENCOUNTER — Other Ambulatory Visit: Payer: Self-pay

## 2016-11-13 NOTE — Progress Notes (Signed)
Pt is requesting a refill on the pended [facility] Please fill if appropriate.

## 2016-11-14 MED ORDER — CLORAZEPATE DIPOTASSIUM 7.5 MG PO TABS: 7.5000 mg | ORAL_TABLET | Freq: Every day | ORAL | 1 refills | Status: DC | PRN

## 2016-11-14 NOTE — Progress Notes (Signed)
Done. Will be faxed today

## 2016-11-27 ENCOUNTER — Ambulatory Visit: Payer: Medicare Other

## 2016-11-29 ENCOUNTER — Ambulatory Visit (INDEPENDENT_AMBULATORY_CARE_PROVIDER_SITE_OTHER): Payer: Medicare Other

## 2016-11-29 DIAGNOSIS — Z1231 Encounter for screening mammogram for malignant neoplasm of breast: Secondary | ICD-10-CM

## 2016-12-02 DIAGNOSIS — M48062 Spinal stenosis, lumbar region with neurogenic claudication: Secondary | ICD-10-CM | POA: Diagnosis not present

## 2016-12-02 DIAGNOSIS — M5416 Radiculopathy, lumbar region: Secondary | ICD-10-CM | POA: Diagnosis not present

## 2016-12-02 DIAGNOSIS — Z6825 Body mass index (BMI) 25.0-25.9, adult: Secondary | ICD-10-CM | POA: Diagnosis not present

## 2016-12-02 DIAGNOSIS — M545 Low back pain: Secondary | ICD-10-CM | POA: Diagnosis not present

## 2016-12-06 DIAGNOSIS — H35431 Paving stone degeneration of retina, right eye: Secondary | ICD-10-CM | POA: Diagnosis not present

## 2016-12-06 DIAGNOSIS — H43813 Vitreous degeneration, bilateral: Secondary | ICD-10-CM | POA: Diagnosis not present

## 2016-12-06 DIAGNOSIS — H43391 Other vitreous opacities, right eye: Secondary | ICD-10-CM | POA: Diagnosis not present

## 2016-12-06 DIAGNOSIS — H35371 Puckering of macula, right eye: Secondary | ICD-10-CM | POA: Diagnosis not present

## 2016-12-13 DIAGNOSIS — M5416 Radiculopathy, lumbar region: Secondary | ICD-10-CM | POA: Diagnosis not present

## 2016-12-13 DIAGNOSIS — M545 Low back pain: Secondary | ICD-10-CM | POA: Diagnosis not present

## 2016-12-16 ENCOUNTER — Emergency Department (INDEPENDENT_AMBULATORY_CARE_PROVIDER_SITE_OTHER)
Admission: EM | Admit: 2016-12-16 | Discharge: 2016-12-16 | Disposition: A | Discharge status: Home / Self Care | Payer: Medicare Other | Source: Home / Self Care | Attending: Family Medicine | Admitting: Family Medicine

## 2016-12-16 ENCOUNTER — Encounter: Payer: Self-pay | Admitting: *Deleted

## 2016-12-16 DIAGNOSIS — L219 Seborrheic dermatitis, unspecified: Secondary | ICD-10-CM | POA: Diagnosis not present

## 2016-12-16 MED ORDER — DOXYCYCLINE HYCLATE 100 MG PO CAPS: 100.0000 mg | ORAL_CAPSULE | Freq: Two times a day (BID) | ORAL | 0 refills | Status: DC

## 2016-12-16 MED ORDER — HYDROCODONE-ACETAMINOPHEN 5-325 MG PO TABS: ORAL_TABLET | ORAL | 0 refills | Status: DC

## 2016-12-16 MED ORDER — KETOCONAZOLE 2 % EX SHAM: MEDICATED_SHAMPOO | CUTANEOUS | 0 refills | Status: DC

## 2016-12-16 NOTE — ED Provider Notes (Signed)
Traci [name] C[facility]    CSN: [id number] Arrival date & time: 12/16/16  1403     History   Chief Complaint Chief Complaint  Patient presents with  . Rash    HPI Traci [name] is a 71 y.o. female.   Patient complains of a pruritic rash on her right occipital scalp and neck for about 1.5 weeks.  She has had no improvement after using a medicated tar shampoo.   The history is provided by the patient.  Rash  Location: scalp. Quality: dryness, itchiness, redness and scaling   Quality: not blistering, not burning, not draining, not painful, not peeling, not swelling and not weeping   Severity:  Moderate Onset quality:  Gradual Duration:  10 days Timing:  Constant Progression:  Worsening Chronicity:  New Context: not animal contact, not chemical exposure, not exposure to similar rash, not food, not insect bite/sting, not medications, not new detergent/soap and not plant contact   Relieved by:  Nothing Worsened by:  Nothing Ineffective treatments:  Topical steroids Associated symptoms: fatigue   Associated symptoms: no abdominal pain, no diarrhea, no fever, no headaches, no induration, no joint pain, no myalgias, no nausea, no periorbital edema, no shortness of breath, no sore throat, no throat swelling, no tongue swelling and no URI     Past Medical History:  Diagnosis Date  . Anxiety   . Atrial fibrillation ([place])   . CAD (coronary artery disease)   . Claudication ([place]) 01/06/2008   Lower extremity dopplers - no evidence of arterial insufficiency, normal exam  . Coronary artery disease   . GERD (gastroesophageal reflux disease)   . Hyperlipidemia   . Hypertension 11/30/2010   echo- EF 55%; normal w/ mildly sclerotic aortic valve  . Hypertension 11/05/11   renal dopplers - celiac artery and SMA >50% diameter reduction, R renal artery - mildly elevated velocities 1-59% diameter reduction, L renal artery normal  . Nonspecific ST-T wave electrocardiographic  changes 03/26/2011   R/Lmv - EF 74%, normal perfusion all regions, ST depression w/ Lexiscan infusion w/o assoc angina  . Osteopenia   . PAD (peripheral artery disease) ([place])   . Peripheral vascular disease ([place])   . PVD (peripheral vascular disease) ([place]) 11/05/2011   doppler - R/L brachial pressures essentially equal w/o inflow disease; L sublclavian/CCA bypass graft demonstrates patent flow, no evidence of significant stenosis  . Sigmoid diverticulitis     Patient Active Problem List   Diagnosis Date Noted  . Protein-calorie malnutrition ([place]) 08/29/2016  . Atypical nevus 08/22/2016  . Acute bilateral low back pain with left-sided sciatica 06/06/2016  . Osteopenia 12/13/2015  . Menopause 12/08/2015  . History of colonoscopy 07/17/2015  . Irregular heartbeat 07/26/2014  . 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 Preoperative evaluation of a medical condition to rule out surgical contraindications (TAR required) 01/17/2014  . 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[place]) 01/16/2014  . Hyperlipidemia 04/05/2013  . Pre-syncope 12/06/2012  . Hypertension 12/06/2012  . Peripheral arterial disease ([place]) 12/06/2012  . Coronary artery disease, non-occlusive, last cath 2009 12/06/2012  . Hyponatremia, improved holding diuretic 12/06/2012  . Atherosclerosis of other specified arteries 04/02/2012  . 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 Stricture of artery ([place]) 10/02/2011  . Subclavian artery stenosis, left ([place]) 10/02/2011    Past Surgical History:  Procedure Laterality Date  . ABDOMINAL AORTIC ANEURYSM REPAIR N/A 01/27/2014   Procedure: AORTO-SUPERIOR MESENTERIC ARTERY BYPASS GRAFT;  Surgeon: Angelia Mould, MD;  Location: [facility];  Service: Vascular;  Laterality:  N/A;  . ABDOMINAL HYSTERECTOMY    . APPENDECTOMY    . CARDIAC CATHETERIZATION  06/03/2008   60% LAD, involving D2, borderline significant by IVUS, medical therapy. CFX, RCA OK.  . CHOLECYSTECTOMY    . EYE SURGERY     retinal  surgery  . SPINE SURGERY  Feb. 2011  . [place]   X's  several  . SUBCLAVIAN ARTERY STENT Left 09/20/2008   LSA ISR 7x73mm Cordis Genesis on Opta premount, reduction from 80% to 0%  . subclavian artery stents  01/23/2010   Left carotid to subclavian artery Bypass  . VISCERAL ANGIOGRAM  01/17/2014   Procedure: VISCERAL ANGIOGRAM;  Surgeon: Angelia Mould, MD;  Location: [facility] CATH LAB;  Service: Cardiovascular;;    OB History    No data available       Home Medications    Prior to Admission medications   Medication Sig Start Date End Date Taking? Authorizing Provider  aspirin EC 81 MG tablet Take 81 mg by mouth daily.    [provider]  atenolol (TENORMIN) 25 MG tablet Take 1 tablet (25 mg total) by mouth daily. 02/08/16   Croitoru, Mihai, MD  atorvastatin (LIPITOR) 10 MG tablet TAKE 1 TABLET EVERY EVENING 10/28/16   Croitoru, Mihai, MD  candesartan (ATACAND) 16 MG tablet TAKE ONE-HALF (1/2) TABLET EVERY MORNING 10/28/16   Croitoru, Mihai, MD  cholecalciferol (VITAMIN D) 1000 UNITS tablet Take 1,000 Units by mouth daily.    [provider]  clopidogrel (PLAVIX) 75 MG tablet TAKE 1 TABLET DAILY 10/28/16   Croitoru, Mihai, MD  clorazepate (TRANXENE) 7.5 MG tablet Take 1 tablet (7.5 mg total) by mouth daily as needed. 11/14/16   Gregor Hams, MD  cyanocobalamin 1000 MCG tablet Take 100 mcg by mouth daily.    [provider]  doxycycline (VIBRAMYCIN) 100 MG capsule Take 1 capsule (100 mg total) by mouth 2 (two) times daily. Take with food. 12/16/16   Kandra Nicolas, MD  EPINEPHrine (EPIPEN 2-PAK) 0.3 mg/0.3 mL IJ SOAJ injection Inject 0.3 mLs (0.3 mg total) into the muscle as needed (for allergic reaction). 10/02/15   Marcial Pacas, DO  esomeprazole (NEXIUM) 40 MG capsule Take 1 capsule (40 mg total) by mouth daily. Take 1 tab daily Patient taking differently: Take 40 mg by mouth daily.  02/23/15   Croitoru, Mihai, MD  estradiol (ESTRACE) 0.5 MG  tablet Take 1 tablet (0.5 mg total) by mouth daily. 06/26/16   Gregor Hams, MD  furosemide (LASIX) 20 MG tablet TAKE 1 TABLET EVERY OTHER DAY 09/30/16   Croitoru, Dani Gobble, MD  gabapentin (NEURONTIN) 300 MG capsule Take 1 capsule (300 mg total) by mouth 3 (three) times daily. 10/08/16   Tanna Furry, MD  HYDROcodone-acetaminophen (NORCO/VICODIN) 5-325 MG tablet Take one by mouth at bedtime as needed for pain 12/16/16   Kandra Nicolas, MD  ipratropium (ATROVENT) 0.06 % nasal spray Place 2 sprays into both nostrils every 4 (four) hours as needed for rhinitis. 06/06/16   Gregor Hams, MD  ketoconazole (NIZORAL) 2 % shampoo Apply once daily and wash off for one week. 12/16/16   Kandra Nicolas, MD  lidocaine (LIDODERM) 5 % Place 1 patch onto the skin daily. Remove & Discard patch within 12 hours or as directed by MD 10/08/16   Tanna Furry, MD  linaclotide [facility]) 145 MCG CAPS capsule Take 1 capsule by mouth daily as needed. 12/22/15   [provider]  meclizine (ANTIVERT) 25 MG tablet Take 0.5 tablets (12.5 mg total) by mouth 3 (three) times daily as needed for dizziness or nausea. Patient taking differently: Take 12.5 mg by mouth daily.  05/01/16   Gregor Hams, MD  Multiple Vitamin (MULTIVITAMIN WITH MINERALS) TABS Take 1 tablet by mouth daily.    [provider]  ondansetron (ZOFRAN) 4 MG tablet Take 1 tablet (4 mg total) by mouth every 8 (eight) hours as needed for nausea or vomiting. 08/05/16   Noe Gens, PA-C  polyethylene glycol (MIRALAX / GLYCOLAX) packet Take 17 g by mouth 2 (two) times daily. Until stooling regularly Patient taking differently: Take 8.5 g by mouth daily.  11/09/14   Silverio Decamp, MD  zolpidem (AMBIEN) 10 MG tablet Take 1 tablet (10 mg total) by mouth at bedtime as needed for sleep. 05/01/16   Gregor Hams, MD    Family History Family History  Problem Relation Age of Onset  . Heart disease Father 9       Heart Disease before age 56  . Kidney  disease Father   . Heart attack Father   . Hyperlipidemia Father   . Hypertension Father   . Kidney failure Mother   . Diabetes Maternal Grandmother   . Heart disease Paternal Grandfather     Social History Social History  Substance Use Topics  . Smoking status: Former Smoker    Packs/day: 0.25    Years: 20.00    Types: Cigarettes    Quit date: 09/30/1993  . Smokeless tobacco: Never Used  . Alcohol use No     Allergies   Cortisone; Dilaudid [hydromorphone hcl]; Iodine; Medrol [methylprednisolone]; Omnipaque [iohexol]; Prednisone; Shellfish allergy; Sulfa drugs cross reactors; Augmentin [amoxicillin-pot clavulanate]; Morphine and related; Codeine; and Erythromycin   Review of Systems Review of Systems  Constitutional: Positive for fatigue. Negative for fever.  HENT: Negative for sore throat.   Respiratory: Negative for shortness of breath.   Gastrointestinal: Negative for abdominal pain, diarrhea and nausea.  Musculoskeletal: Negative for arthralgias and myalgias.  Skin: Positive for rash.  Neurological: Negative for headaches.  All other systems reviewed and are negative.    Physical Exam Triage Vital Signs ED Triage Vitals  Enc Vitals Group     BP 12/16/16 1434 130/62     Pulse Rate 12/16/16 1434 62     Resp 12/16/16 1434 16     Temp 12/16/16 1434 98.1 F (36.7 C)     Temp Source 12/16/16 1434 Oral     SpO2 12/16/16 1434 100 %     Weight 12/16/16 1435 152 lb (68.9 kg)     Height 12/16/16 1435 5\' 4"  (1.626 m)     Head Circumference --      Peak Flow --      Pain Score 12/16/16 1435 0     Pain Loc --      Pain Edu? --      Excl. in [place]? --    No data found.   Updated Vital Signs BP 130/62 (BP Location: Left Arm)   Pulse 62   Temp 98.1 F (36.7 C) (Oral)   Resp 16   Ht 5\' 4"  (1.626 m)   Wt 152 lb (68.9 kg)   SpO2 100%   BMI 26.09 kg/m   Visual Acuity Right Eye Distance:   Left Eye Distance:   Bilateral Distance:    Right Eye Near:   Left Eye  Near:    Bilateral Near:  Physical Exam  Constitutional: She appears well-developed and well-nourished. No distress.  HENT:  Head: Atraumatic.    Right Ear: External ear normal.  Left Ear: External ear normal.  Nose: Nose normal.  Mouth/Throat: Oropharynx is clear and moist.  Right occipital scalp and neck have a plaque-like erythematous scaly eruption as noted on diagram.  No swelling, induration, fluctuance, or tenderness to palpation.  Eyes: Conjunctivae are normal. Pupils are equal, round, and reactive to light.  Neck: Neck supple.  Cardiovascular: Normal heart sounds.   Pulmonary/Chest: Breath sounds normal.  Abdominal: There is no tenderness.  Musculoskeletal: She exhibits no edema.  Lymphadenopathy:    She has no cervical adenopathy.  Neurological: She is alert.  Skin: Skin is warm and dry.  Nursing note and vitals reviewed.    UC Treatments / Results  Labs (all labs ordered are listed, but only abnormal results are displayed) Labs Reviewed - No data to display  EKG  EKG Interpretation None       Radiology No results found.  Procedures Procedures (including critical care time)  Medications Ordered in UC Medications - No data to display   Initial Impression / Assessment and Plan / UC Course  I have reviewed the triage vital signs and the nursing notes.  Pertinent labs & imaging results that were available during my care of the patient were reviewed by me and considered in my medical decision making (see chart for details).    Begin ketoconazole shampoo daily.  Rx doxycycline 100mg  BID. Followup with dermatologist if not improved one week.    Final Clinical Impressions(s) / UC Diagnoses   Final diagnoses:  Seborrheic dermatitis of scalp    New Prescriptions New Prescriptions   DOXYCYCLINE (VIBRAMYCIN) 100 MG CAPSULE    Take 1 capsule (100 mg total) by mouth 2 (two) times daily. Take with food.   HYDROCODONE-ACETAMINOPHEN (NORCO/VICODIN)  5-325 MG TABLET    Take one by mouth at bedtime as needed for pain   KETOCONAZOLE (NIZORAL) 2 % SHAMPOO    Apply once daily and wash off for one week.     Kandra Nicolas, MD 12/24/16 [phone]

## 2016-12-16 NOTE — ED Triage Notes (Signed)
Pt c/o inflammation at the base of her scalp x 10 days. She has used an OTC Tar medicated shampoo without relief.

## 2016-12-19 DIAGNOSIS — M545 Low back pain: Secondary | ICD-10-CM | POA: Diagnosis not present

## 2016-12-19 DIAGNOSIS — M5416 Radiculopathy, lumbar region: Secondary | ICD-10-CM | POA: Diagnosis not present

## 2016-12-23 ENCOUNTER — Other Ambulatory Visit: Payer: Self-pay | Admitting: Family Medicine

## 2016-12-25 DIAGNOSIS — M5416 Radiculopathy, lumbar region: Secondary | ICD-10-CM | POA: Diagnosis not present

## 2016-12-25 DIAGNOSIS — M545 Low back pain: Secondary | ICD-10-CM | POA: Diagnosis not present

## 2016-12-30 DIAGNOSIS — M545 Low back pain: Secondary | ICD-10-CM | POA: Diagnosis not present

## 2016-12-30 DIAGNOSIS — M5416 Radiculopathy, lumbar region: Secondary | ICD-10-CM | POA: Diagnosis not present

## 2017-01-01 DIAGNOSIS — M545 Low back pain: Secondary | ICD-10-CM | POA: Diagnosis not present

## 2017-01-01 DIAGNOSIS — M5416 Radiculopathy, lumbar region: Secondary | ICD-10-CM | POA: Diagnosis not present

## 2017-01-03 ENCOUNTER — Other Ambulatory Visit: Payer: Self-pay | Admitting: Cardiovascular Disease

## 2017-01-07 DIAGNOSIS — M5416 Radiculopathy, lumbar region: Secondary | ICD-10-CM | POA: Diagnosis not present

## 2017-01-07 DIAGNOSIS — M545 Low back pain: Secondary | ICD-10-CM | POA: Diagnosis not present

## 2017-01-09 DIAGNOSIS — M5416 Radiculopathy, lumbar region: Secondary | ICD-10-CM | POA: Diagnosis not present

## 2017-01-09 DIAGNOSIS — M545 Low back pain: Secondary | ICD-10-CM | POA: Diagnosis not present

## 2017-01-14 ENCOUNTER — Emergency Department (HOSPITAL_BASED_OUTPATIENT_CLINIC_OR_DEPARTMENT_OTHER)
Admission: EM | Admit: 2017-01-14 | Discharge: 2017-01-14 | Disposition: A | Discharge status: Home / Self Care | Payer: Medicare Other | Attending: Emergency Medicine | Admitting: Emergency Medicine

## 2017-01-14 ENCOUNTER — Emergency Department (HOSPITAL_BASED_OUTPATIENT_CLINIC_OR_DEPARTMENT_OTHER): Payer: Medicare Other

## 2017-01-14 ENCOUNTER — Encounter (HOSPITAL_BASED_OUTPATIENT_CLINIC_OR_DEPARTMENT_OTHER): Payer: Self-pay | Admitting: *Deleted

## 2017-01-14 DIAGNOSIS — Z7902 Long term (current) use of antithrombotics/antiplatelets: Secondary | ICD-10-CM | POA: Insufficient documentation

## 2017-01-14 DIAGNOSIS — K529 Noninfective gastroenteritis and colitis, unspecified: Secondary | ICD-10-CM

## 2017-01-14 DIAGNOSIS — R1013 Epigastric pain: Secondary | ICD-10-CM | POA: Diagnosis present

## 2017-01-14 DIAGNOSIS — I10 Essential (primary) hypertension: Secondary | ICD-10-CM | POA: Diagnosis not present

## 2017-01-14 DIAGNOSIS — I2510 Atherosclerotic heart disease of native coronary artery without angina pectoris: Secondary | ICD-10-CM | POA: Insufficient documentation

## 2017-01-14 DIAGNOSIS — Z7982 Long term (current) use of aspirin: Secondary | ICD-10-CM | POA: Insufficient documentation

## 2017-01-14 DIAGNOSIS — K5289 Other specified noninfective gastroenteritis and colitis: Secondary | ICD-10-CM | POA: Insufficient documentation

## 2017-01-14 DIAGNOSIS — R109 Unspecified abdominal pain: Secondary | ICD-10-CM | POA: Diagnosis not present

## 2017-01-14 DIAGNOSIS — Z87891 Personal history of nicotine dependence: Secondary | ICD-10-CM | POA: Diagnosis not present

## 2017-01-14 DIAGNOSIS — Z79899 Other long term (current) drug therapy: Secondary | ICD-10-CM | POA: Diagnosis not present

## 2017-01-14 LAB — COMPREHENSIVE METABOLIC PANEL
ALT: 20 U/L (ref 14–54)
AST: 30 U/L (ref 15–41)
Albumin: 3.9 g/dL (ref 3.5–5.0)
Alkaline Phosphatase: 53 U/L (ref 38–126)
Anion gap: 9 (ref 5–15)
BUN: 12 mg/dL (ref 6–20)
CHLORIDE: 98 mmol/L — AB (ref 101–111)
CO2: 28 mmol/L (ref 22–32)
CREATININE: 0.89 mg/dL (ref 0.44–1.00)
Calcium: 8.7 mg/dL — ABNORMAL LOW (ref 8.9–10.3)
Glucose, Bld: 107 mg/dL — ABNORMAL HIGH (ref 65–99)
POTASSIUM: 4.6 mmol/L (ref 3.5–5.1)
SODIUM: 135 mmol/L (ref 135–145)
Total Bilirubin: 0.4 mg/dL (ref 0.3–1.2)
Total Protein: 7.3 g/dL (ref 6.5–8.1)

## 2017-01-14 LAB — CBC WITH DIFFERENTIAL/PLATELET
BASOS ABS: 0 10*3/uL (ref 0.0–0.1)
Basophils Relative: 1 %
Eosinophils Absolute: 0.2 10*3/uL (ref 0.0–0.7)
Eosinophils Relative: 4 %
HEMATOCRIT: 36.4 % (ref 36.0–46.0)
HEMOGLOBIN: 12.3 g/dL (ref 12.0–15.0)
LYMPHS PCT: 29 %
Lymphs Abs: 1.2 10*3/uL (ref 0.7–4.0)
MCH: 29.9 pg (ref 26.0–34.0)
MCHC: 33.8 g/dL (ref 30.0–36.0)
MCV: 88.6 fL (ref 78.0–100.0)
Monocytes Absolute: 0.4 10*3/uL (ref 0.1–1.0)
Monocytes Relative: 8 %
NEUTROS ABS: 2.5 10*3/uL (ref 1.7–7.7)
Neutrophils Relative %: 58 %
Platelets: 304 10*3/uL (ref 150–400)
RBC: 4.11 MIL/uL (ref 3.87–5.11)
RDW: 12.3 % (ref 11.5–15.5)
WBC: 4.3 10*3/uL (ref 4.0–10.5)

## 2017-01-14 LAB — LIPASE, BLOOD: LIPASE: 30 U/L (ref 11–51)

## 2017-01-14 MED ORDER — METRONIDAZOLE 500 MG PO TABS: 500.0000 mg | ORAL_TABLET | Freq: Three times a day (TID) | ORAL | 0 refills | Status: DC

## 2017-01-14 MED ORDER — ONDANSETRON 4 MG PO TBDP
4.0000 mg | ORAL_TABLET | Freq: Once | ORAL | Status: AC
  Administered 2017-01-14: 4 mg via ORAL
  Filled 2017-01-14: qty 1

## 2017-01-14 MED ORDER — SODIUM CHLORIDE 0.9 % IV BOLUS (SEPSIS)
1000.0000 mL | Freq: Once | INTRAVENOUS | Status: AC
  Administered 2017-01-14: 1000 mL via INTRAVENOUS

## 2017-01-14 MED ORDER — HYDROCODONE-ACETAMINOPHEN 5-325 MG PO TABS: 1.0000 | ORAL_TABLET | Freq: Four times a day (QID) | ORAL | 0 refills | Status: DC | PRN

## 2017-01-14 MED ORDER — CIPROFLOXACIN HCL 500 MG PO TABS: 500.0000 mg | ORAL_TABLET | Freq: Two times a day (BID) | ORAL | 0 refills | Status: DC

## 2017-01-14 MED FILL — metroNIDAZOLE 500 MG TABS: 500 | 7 days supply | Qty: 21 | Fill #0

## 2017-01-14 MED FILL — HYDROCODON-APAP 5-325: 5-325 | 2 days supply | Qty: 12 | Fill #0

## 2017-01-14 MED FILL — CIPROFLOXACIN HCL 500 MG TA: 500 | 7 days supply | Qty: 14 | Fill #0

## 2017-01-14 NOTE — ED Notes (Signed)
Patient transported to CT 

## 2017-01-14 NOTE — ED Provider Notes (Signed)
[facility] DEPT [facility] Provider Note   CSN: [id number] Arrival date & time: 01/14/17  1041     History   Chief Complaint Chief Complaint  Patient presents with  . Abdominal Pain    HPI Traci [name] is a 71 y.o. female.  Patient is a 71 year old female with past medical history of peripheral vascular disease, hypertension, atrial fibrillation, and AAA repair. She presents today for evaluation of abdominal pain. This started yesterday when she bent over. Her pain is in the epigastrium and left upper quadrant. She denies any nausea vomiting, or diarrhea. She denies any fevers or chills.   The history is provided by the patient.  Abdominal Pain   This is a new problem. The current episode started yesterday. The problem occurs constantly. The problem has not changed since onset.The pain is associated with an unknown factor. The pain is located in the epigastric region and LUQ. The quality of the pain is cramping. The pain is moderate. Pertinent negatives include fever, melena, constipation and dysuria. Exacerbated by: Bending forward, palpation. Nothing relieves the symptoms.    Past Medical History:  Diagnosis Date  . Anxiety   . Atrial fibrillation ([place])   . CAD (coronary artery disease)   . Claudication ([place]) 01/06/2008   Lower extremity dopplers - no evidence of arterial insufficiency, normal exam  . Coronary artery disease   . GERD (gastroesophageal reflux disease)   . Hyperlipidemia   . Hypertension 11/30/2010   echo- EF 55%; normal w/ mildly sclerotic aortic valve  . Hypertension 11/05/11   renal dopplers - celiac artery and SMA >50% diameter reduction, R renal artery - mildly elevated velocities 1-59% diameter reduction, L renal artery normal  . Nonspecific ST-T wave electrocardiographic changes 03/26/2011   R/Lmv - EF 74%, normal perfusion all regions, ST depression w/ Lexiscan infusion w/o assoc angina  . Osteopenia   . PAD (peripheral artery disease) ([place])    . Peripheral vascular disease ([place])   . PVD (peripheral vascular disease) ([place]) 11/05/2011   doppler - R/L brachial pressures essentially equal w/o inflow disease; L sublclavian/CCA bypass graft demonstrates patent flow, no evidence of significant stenosis  . Sigmoid diverticulitis     Patient Active Problem List   Diagnosis Date Noted  . Protein-calorie malnutrition ([place]) 08/29/2016  . Atypical nevus 08/22/2016  . Acute bilateral low back pain with left-sided sciatica 06/06/2016  . Osteopenia 12/13/2015  . Menopause 12/08/2015  . History of colonoscopy 07/17/2015  . Irregular heartbeat 07/26/2014  . 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 Preoperative evaluation of a medical condition to rule out surgical contraindications (TAR required) 01/17/2014  . 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[place]) 01/16/2014  . Hyperlipidemia 04/05/2013  . Pre-syncope 12/06/2012  . Hypertension 12/06/2012  . Peripheral arterial disease ([place]) 12/06/2012  . Coronary artery disease, non-occlusive, last cath 2009 12/06/2012  . Hyponatremia, improved holding diuretic 12/06/2012  . Atherosclerosis of other specified arteries 04/02/2012  . 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 Stricture of artery ([place]) 10/02/2011  . Subclavian artery stenosis, left ([place]) 10/02/2011    Past Surgical History:  Procedure Laterality Date  . ABDOMINAL AORTIC ANEURYSM REPAIR N/A 01/27/2014   Procedure: AORTO-SUPERIOR MESENTERIC ARTERY BYPASS GRAFT;  Surgeon: Angelia Mould, MD;  Location: [facility];  Service: Vascular;  Laterality: N/A;  . ABDOMINAL HYSTERECTOMY    . APPENDECTOMY    . CARDIAC CATHETERIZATION  06/03/2008   60% LAD, involving D2, borderline significant by IVUS, medical therapy. CFX, RCA OK.  . CHOLECYSTECTOMY    .  EYE SURGERY     retinal surgery  . SPINE SURGERY  Feb. 2011  . [place]   X's  several  . SUBCLAVIAN ARTERY STENT Left 09/20/2008   LSA ISR 7x21mm Cordis Genesis on Opta  premount, reduction from 80% to 0%  . subclavian artery stents  01/23/2010   Left carotid to subclavian artery Bypass  . VISCERAL ANGIOGRAM  01/17/2014   Procedure: VISCERAL ANGIOGRAM;  Surgeon: Angelia Mould, MD;  Location[facility] C[facility] LAB;  Service: Cardiovascular;;    OB History    No data available       Home Medications    Prior to Admission medications   Medication Sig Start Date End Date Taking? Authorizing Provider  aspirin EC 81 MG tablet Take 81 mg by mouth daily.    [provider]  atenolol (TENORMIN) 25 MG tablet Take 1 tablet (25 mg total) by mouth daily. 02/08/16   Croitoru, Mihai, MD  atenolol (TENORMIN) 25 MG tablet TAKE 1 TABLET DAILY 01/03/17   Croitoru, Mihai, MD  atorvastatin (LIPITOR) 10 MG tablet TAKE 1 TABLET EVERY EVENING 10/28/16   Croitoru, Mihai, MD  candesartan (ATACAND) 16 MG tablet TAKE ONE-HALF (1/2) TABLET EVERY MORNING 10/28/16   Croitoru, Mihai, MD  cholecalciferol (VITAMIN D) 1000 UNITS tablet Take 1,000 Units by mouth daily.    [provider]  clopidogrel (PLAVIX) 75 MG tablet TAKE 1 TABLET DAILY 10/28/16   Croitoru, Mihai, MD  clorazepate (TRANXENE) 7.5 MG tablet Take 1 tablet (7.5 mg total) by mouth daily as needed. 11/14/16   Gregor Hams, MD  cyanocobalamin 1000 MCG tablet Take 100 mcg by mouth daily.    [provider]  doxycycline (VIBRAMYCIN) 100 MG capsule Take 1 capsule (100 mg total) by mouth 2 (two) times daily. Take with food. 12/16/16   Kandra Nicolas, MD  EPINEPHrine (EPIPEN 2-PAK) 0.3 mg/0.3 mL IJ SOAJ injection Inject 0.3 mLs (0.3 mg total) into the muscle as needed (for allergic reaction). 10/02/15   Marcial Pacas, DO  esomeprazole (NEXIUM) 40 MG capsule Take 1 capsule (40 mg total) by mouth daily. Take 1 tab daily Patient taking differently: Take 40 mg by mouth daily.  02/23/15   Croitoru, Mihai, MD  estradiol (ESTRACE) 0.5 MG tablet TAKE 1 TABLET DAILY 12/23/16   Gregor Hams, MD  furosemide (LASIX) 20 MG tablet  TAKE 1 TABLET EVERY OTHER DAY 09/30/16   Croitoru, Dani Gobble, MD  gabapentin (NEURONTIN) 300 MG capsule Take 1 capsule (300 mg total) by mouth 3 (three) times daily. 10/08/16   Tanna Furry, MD  HYDROcodone-acetaminophen (NORCO/VICODIN) 5-325 MG tablet Take one by mouth at bedtime as needed for pain 12/16/16   Kandra Nicolas, MD  ipratropium (ATROVENT) 0.06 % nasal spray Place 2 sprays into both nostrils every 4 (four) hours as needed for rhinitis. 06/06/16   Gregor Hams, MD  ketoconazole (NIZORAL) 2 % shampoo Apply once daily and wash off for one week. 12/16/16   Kandra Nicolas, MD  lidocaine (LIDODERM) 5 % Place 1 patch onto the skin daily. Remove & Discard patch within 12 hours or as directed by MD 10/08/16   Tanna Furry, MD  linaclotide [facility]) 145 MCG CAPS capsule Take 1 capsule by mouth daily as needed. 12/22/15   [provider]  meclizine (ANTIVERT) 25 MG tablet Take 0.5 tablets (12.5 mg total) by mouth 3 (three) times daily as needed for dizziness or nausea. Patient taking differently: Take 12.5 mg  by mouth daily.  05/01/16   Gregor Hams, MD  Multiple Vitamin (MULTIVITAMIN WITH MINERALS) TABS Take 1 tablet by mouth daily.    [provider]  ondansetron (ZOFRAN) 4 MG tablet Take 1 tablet (4 mg total) by mouth every 8 (eight) hours as needed for nausea or vomiting. 08/05/16   Noe Gens, PA-C  polyethylene glycol (MIRALAX / GLYCOLAX) packet Take 17 g by mouth 2 (two) times daily. Until stooling regularly Patient taking differently: Take 8.5 g by mouth daily.  11/09/14   Silverio Decamp, MD  zolpidem (AMBIEN) 10 MG tablet Take 1 tablet (10 mg total) by mouth at bedtime as needed for sleep. 05/01/16   Gregor Hams, MD    Family History Family History  Problem Relation Age of Onset  . Heart disease Father 17       Heart Disease before age 46  . Kidney disease Father   . Heart attack Father   . Hyperlipidemia Father   . Hypertension Father   . Kidney failure  Mother   . Diabetes Maternal Grandmother   . Heart disease Paternal Grandfather     Social History Social History  Substance Use Topics  . Smoking status: Former Smoker    Packs/day: 0.25    Years: 20.00    Types: Cigarettes    Quit date: 09/30/1993  . Smokeless tobacco: Never Used  . Alcohol use No     Allergies   Cortisone; Dilaudid [hydromorphone hcl]; Iodine; Medrol [methylprednisolone]; Omnipaque [iohexol]; Prednisone; Shellfish allergy; Sulfa drugs cross reactors; Augmentin [amoxicillin-pot clavulanate]; Morphine and related; Codeine; and Erythromycin   Review of Systems Review of Systems  Constitutional: Negative for fever.  Gastrointestinal: Positive for abdominal pain. Negative for constipation and melena.  Genitourinary: Negative for dysuria.  All other systems reviewed and are negative.    Physical Exam Updated Vital Signs BP 129/61   Pulse 62   Temp 98.2 F (36.8 C) (Oral)   Resp 20   Ht 5\' 4"  (1.626 m)   Wt 67.6 kg (149 lb)   SpO2 98%   BMI 25.58 kg/m   Physical Exam  Constitutional: She is oriented to person, place, and time. She appears well-developed and well-nourished. No distress.  HENT:  Head: Normocephalic and atraumatic.  Neck: Normal range of motion. Neck supple.  Cardiovascular: Normal rate and regular rhythm.  Exam reveals no gallop and no friction rub.   No murmur heard. Pulmonary/Chest: Effort normal and breath sounds normal. No respiratory distress. She has no wheezes.  Abdominal: Soft. Bowel sounds are normal. She exhibits no distension. There is tenderness. There is no rebound and no guarding.  There is mild tenderness to palpation in the epigastric region and left upper quadrant. There are no pulsatile masses and no peritoneal signs.  Musculoskeletal: Normal range of motion.  Neurological: She is alert and oriented to person, place, and time.  Skin: Skin is warm and dry. She is not diaphoretic.  Nursing note and vitals  reviewed.    ED Treatments / Results  Labs (all labs ordered are listed, but only abnormal results are displayed) Labs Reviewed  COMPREHENSIVE METABOLIC PANEL  LIPASE, BLOOD  CBC WITH DIFFERENTIAL/PLATELET    EKG  EKG Interpretation  Date/Time:  Tuesday January 14 2017 10:57:27 EDT Ventricular Rate:  58 PR Interval:    QRS Duration: 91 QT Interval:  429 QTC Calculation: 422 R Axis:   64 Text Interpretation:  Sinus rhythm Normal ECG Confirmed by Veryl Speak [phone])  on 01/14/2017 11:00:52 AM       Radiology No results found.  Procedures Procedures (including critical care time)  Medications Ordered in ED Medications  sodium chloride 0.9 % bolus 1,000 mL (not administered)     Initial Impression / Assessment and Plan / ED Course  I have reviewed the triage vital signs and the nursing notes.  Pertinent labs & imaging results that were available during my care of the patient were reviewed by me and considered in my medical decision making (see chart for details).  CT scan shows thickening of the distal sigmoid and rectum consistent with an inflammatory process. This will be treated with Cipro and Flagyl, pain medicine, and she is to follow-up with gastroenterology if not improving.  Final Clinical Impressions(s) / ED Diagnoses   Final diagnoses:  None    New Prescriptions New Prescriptions   No medications on file     Veryl Speak, MD 01/14/17 1410

## 2017-01-14 NOTE — ED Triage Notes (Signed)
Pain from her mid abdomen to her back since yesterday. Pulling sensation since bending over. Woke with swelling in her neck and hoarse voice. Headache.  Hx aortic bypass 2 years ago.

## 2017-01-14 NOTE — ED Notes (Signed)
C/o nausea 

## 2017-01-14 NOTE — ED Notes (Signed)
Patient returned from CT

## 2017-01-14 NOTE — Discharge Instructions (Signed)
Cipro and Flagyl as prescribed.  Hydrocodone as prescribed as needed for pain.  Follow-up with your primary Dr. in one week for recheck, and return to the emergency department if you develop worsening pain, high fevers or bloody stools, or other new and concerning symptoms.

## 2017-01-16 ENCOUNTER — Telehealth: Payer: Self-pay | Admitting: *Deleted

## 2017-01-16 NOTE — Telephone Encounter (Signed)
That's odd.  Have her introduce one at a time and see if one of them can be found causative.

## 2017-01-16 NOTE — Telephone Encounter (Signed)
Patient states she took cipro and flagyl as directed for Colitis yesterday only. She experienced blurred vision,dizziness,ringing in the ears,nausea and headache. She feels this is from the medication. She has an ER f/u with Dr. Georgina Snell this coming Tuesday.Patient has not taken any medication today and does not want to continue the medications. Please advise.

## 2017-01-17 NOTE — Telephone Encounter (Signed)
Patient notified and will try to do what was recommended

## 2017-01-21 ENCOUNTER — Encounter: Payer: Self-pay | Admitting: Family Medicine

## 2017-01-21 ENCOUNTER — Ambulatory Visit (INDEPENDENT_AMBULATORY_CARE_PROVIDER_SITE_OTHER): Payer: Medicare Other

## 2017-01-21 ENCOUNTER — Telehealth: Payer: Self-pay

## 2017-01-21 ENCOUNTER — Ambulatory Visit (INDEPENDENT_AMBULATORY_CARE_PROVIDER_SITE_OTHER): Payer: Medicare Other | Admitting: Family Medicine

## 2017-01-21 VITALS — BP 155/80 | HR 59 | Temp 97.7°F | Ht 64.0 in | Wt 149.0 lb

## 2017-01-21 DIAGNOSIS — I700 Atherosclerosis of aorta: Secondary | ICD-10-CM | POA: Diagnosis not present

## 2017-01-21 DIAGNOSIS — I6529 Occlusion and stenosis of unspecified carotid artery: Secondary | ICD-10-CM | POA: Diagnosis not present

## 2017-01-21 DIAGNOSIS — R079 Chest pain, unspecified: Secondary | ICD-10-CM

## 2017-01-21 DIAGNOSIS — R197 Diarrhea, unspecified: Secondary | ICD-10-CM | POA: Diagnosis not present

## 2017-01-21 DIAGNOSIS — J9811 Atelectasis: Secondary | ICD-10-CM | POA: Diagnosis not present

## 2017-01-21 DIAGNOSIS — R05 Cough: Secondary | ICD-10-CM | POA: Diagnosis not present

## 2017-01-21 NOTE — Progress Notes (Signed)
Traci [name] is a 71 y.o. female who presents to [facility]: [place] today for abdominal pain. Patient was seen in the emergency room on July 31 for abdominal pain associated with watery diarrhea. She was found to have mild colitis on noncontrast CT scan. She was treated empirically with Cipro and Flagyl which she has not been able to tolerate. She notes upper bilateral quadrant fullness and mild pain. She denies any significant pelvic pain. She stopped taking Cipro Flagyl because she had trouble tolerating the medication. She notes continued diarrhea. She notes positive subjective fevers and chills especially at night. She denies any blood in the stool. The symptoms are not consistent with prior episodes of ischemic gut.   Past Medical History:  Diagnosis Date  . Anxiety   . Atrial fibrillation ([place])   . CAD (coronary artery disease)   . Claudication ([place]) 01/06/2008   Lower extremity dopplers - no evidence of arterial insufficiency, normal exam  . Coronary artery disease   . GERD (gastroesophageal reflux disease)   . Hyperlipidemia   . Hypertension 11/30/2010   echo- EF 55%; normal w/ mildly sclerotic aortic valve  . Hypertension 11/05/11   renal dopplers - celiac artery and SMA >50% diameter reduction, R renal artery - mildly elevated velocities 1-59% diameter reduction, L renal artery normal  . Nonspecific ST-T wave electrocardiographic changes 03/26/2011   R/Lmv - EF 74%, normal perfusion all regions, ST depression w/ Lexiscan infusion w/o assoc angina  . Osteopenia   . PAD (peripheral artery disease) ([place])   . Peripheral vascular disease ([place])   . PVD (peripheral vascular disease) ([place]) 11/05/2011   doppler - R/L brachial pressures essentially equal w/o inflow disease; L sublclavian/CCA bypass graft demonstrates patent flow, no evidence of significant stenosis  .  Sigmoid diverticulitis    Past Surgical History:  Procedure Laterality Date  . ABDOMINAL AORTIC ANEURYSM REPAIR N/A 01/27/2014   Procedure: AORTO-SUPERIOR MESENTERIC ARTERY BYPASS GRAFT;  Surgeon: Angelia Mould, MD;  Location: [facility];  Service: Vascular;  Laterality: N/A;  . ABDOMINAL HYSTERECTOMY    . APPENDECTOMY    . CARDIAC CATHETERIZATION  06/03/2008   60% LAD, involving D2, borderline significant by IVUS, medical therapy. CFX, RCA OK.  . CHOLECYSTECTOMY    . EYE SURGERY     retinal surgery  . SPINE SURGERY  Feb. 2011  . [place]   X's  several  . SUBCLAVIAN ARTERY STENT Left 09/20/2008   LSA ISR 7x74mm Cordis Genesis on Opta premount, reduction from 80% to 0%  . subclavian artery stents  01/23/2010   Left carotid to subclavian artery Bypass  . VISCERAL ANGIOGRAM  01/17/2014   Procedure: VISCERAL ANGIOGRAM;  Surgeon: Angelia Mould, MD;  Location: [facility] CATH LAB;  Service: Cardiovascular;;   Social History  Substance Use Topics  . Smoking status: Former Smoker    Packs/day: 0.25    Years: 20.00    Types: Cigarettes    Quit date: 09/30/1993  . Smokeless tobacco: Never Used  . Alcohol use No   family history includes Diabetes in her maternal grandmother; Heart attack in her father; Heart disease in her paternal grandfather; Heart disease (age of onset: 31) in her father; Hyperlipidemia in her father; Hypertension in her father; Kidney disease in her father; Kidney failure in her mother.  ROS as above:  Medications: Current Outpatient Prescriptions  Medication Sig Dispense Refill  . aspirin EC  81 MG tablet Take 81 mg by mouth daily.    Marland Kitchen atenolol (TENORMIN) 25 MG tablet Take 1 tablet (25 mg total) by mouth daily. 90 tablet 3  . atenolol (TENORMIN) 25 MG tablet TAKE 1 TABLET DAILY 90 tablet 0  . atorvastatin (LIPITOR) 10 MG tablet TAKE 1 TABLET EVERY EVENING 90 tablet 0  . candesartan (ATACAND) 16 MG tablet TAKE ONE-HALF (1/2) TABLET EVERY MORNING  45 tablet 0  . cholecalciferol (VITAMIN D) 1000 UNITS tablet Take 1,000 Units by mouth daily.    . clopidogrel (PLAVIX) 75 MG tablet TAKE 1 TABLET DAILY 90 tablet 0  . clorazepate (TRANXENE) 7.5 MG tablet Take 1 tablet (7.5 mg total) by mouth daily as needed. 90 tablet 1  . cyanocobalamin 1000 MCG tablet Take 100 mcg by mouth daily.    Marland Kitchen doxycycline (VIBRAMYCIN) 100 MG capsule Take 1 capsule (100 mg total) by mouth 2 (two) times daily. Take with food. 14 capsule 0  . EPINEPHrine (EPIPEN 2-PAK) 0.3 mg/0.3 mL IJ SOAJ injection Inject 0.3 mLs (0.3 mg total) into the muscle as needed (for allergic reaction). 2 Device 0  . esomeprazole (NEXIUM) 40 MG capsule Take 1 capsule (40 mg total) by mouth daily. Take 1 tab daily (Patient taking differently: Take 40 mg by mouth daily. ) 90 capsule 0  . estradiol (ESTRACE) 0.5 MG tablet TAKE 1 TABLET DAILY 90 tablet 1  . furosemide (LASIX) 20 MG tablet TAKE 1 TABLET EVERY OTHER DAY 45 tablet 1  . ketoconazole (NIZORAL) 2 % shampoo Apply once daily and wash off for one week. 120 mL 0  . lidocaine (LIDODERM) 5 % Place 1 patch onto the skin daily. Remove & Discard patch within 12 hours or as directed by MD 30 patch 0  . linaclotide (LINZESS) 145 MCG CAPS capsule Take 1 capsule by mouth daily as needed.    . meclizine (ANTIVERT) 25 MG tablet Take 0.5 tablets (12.5 mg total) by mouth 3 (three) times daily as needed for dizziness or nausea. (Patient taking differently: Take 12.5 mg by mouth daily. ) 90 tablet 1  . Multiple Vitamin (MULTIVITAMIN WITH MINERALS) TABS Take 1 tablet by mouth daily.    . polyethylene glycol (MIRALAX / GLYCOLAX) packet Take 17 g by mouth 2 (two) times daily. Until stooling regularly (Patient not taking: Reported on 01/21/2017) 30 packet 11  . zolpidem (AMBIEN) 10 MG tablet Take 1 tablet (10 mg total) by mouth at bedtime as needed for sleep. 90 tablet 0   No current facility-administered medications for this visit.    Allergies  Allergen  Reactions  . Cortisone Anaphylaxis  . Dilaudid [Hydromorphone Hcl] Nausea And Vomiting    Pt states she will start vomiting immediately for 6 hours  . Iodine Anaphylaxis  . Medrol [Methylprednisolone] Anaphylaxis  . Omnipaque [Iohexol] Anaphylaxis  . Prednisone Anaphylaxis  . Shellfish Allergy Anaphylaxis  . Sulfa Drugs Cross Reactors Anaphylaxis  . Augmentin [Amoxicillin-Pot Clavulanate]     Upset stomach  . Morphine And Related     nausea  . Codeine Rash  . Erythromycin Rash    Health Maintenance Health Maintenance  Topic Date Due  . INFLUENZA VACCINE  01/15/2017  . MAMMOGRAM  11/30/2018  . TETANUS/TDAP  06/17/2020  . COLONOSCOPY  07/16/2025  . DEXA SCAN  Completed  . Hepatitis C Screening  Completed  . PNA vac Low Risk Adult  Completed     Exam:  BP (!) 155/80   Pulse (!) 59  Temp 97.7 F (36.5 C) (Oral)   Ht 5\' 4"  (1.626 m)   Wt 149 lb (67.6 kg)   BMI 25.58 kg/m  Gen: Well NAD HEENT: EOMI,  MMM Lungs: Normal work of breathing. CTABL Heart: RRR no MRG Abd: NABS, Soft. Nondistended, Mildly tender upper abdomen with no rebound or guarding. No masses palpated.  Exts: Brisk capillary refill, warm and well perfused.   Lab Results  Component Value Date   WBC 4.3 01/14/2017   HGB 12.3 01/14/2017   HCT 36.4 01/14/2017   MCV 88.6 01/14/2017   PLT 304 01/14/2017     Chemistry      Component Value Date/Time   NA 135 01/14/2017 1210   NA 144 04/07/2013 0857   K 4.6 01/14/2017 1210   CL 98 (L) 01/14/2017 1210   CO2 28 01/14/2017 1210   BUN 12 01/14/2017 1210   BUN 10 04/07/2013 0857   CREATININE 0.89 01/14/2017 1210   CREATININE 0.88 04/01/2016 1346      Component Value Date/Time   CALCIUM 8.7 (L) 01/14/2017 1210   ALKPHOS 53 01/14/2017 1210   AST 30 01/14/2017 1210   ALT 20 01/14/2017 1210   BILITOT 0.4 01/14/2017 1210     Ct Abdomen Pelvis Wo Contrast  Result Date: 01/14/2017 CLINICAL DATA:  Mid abdominal pain.  Back pain. EXAM: CT ABDOMEN AND  PELVIS WITHOUT CONTRAST TECHNIQUE: Multidetector CT imaging of the abdomen and pelvis was performed following the standard protocol without IV contrast. COMPARISON:  09/01/2016 FINDINGS: Lower chest: No acute abnormality. Hepatobiliary: Postcholecystectomy.  Unremarkable unenhanced liver. Pancreas: Unremarkable Spleen: Unremarkable Adrenals/Urinary Tract: Adrenal glands and kidneys are within normal limits. Bladder is within normal limits. Stomach/Bowel: Small hiatal hernia is suspected. No evidence of small-bowel obstruction. No obvious mass in the colon. There is diverticulosis of the descending and sigmoid colon. There is wall thickening of the distal sigmoid colon and rectum. Vascular/Lymphatic: No abnormal retroperitoneal adenopathy. Small gastrohepatic ligament nodes are noted. No evidence of aortic aneurysm. Atherosclerotic calcifications are noted. Aortic SMA bypass graft is suspected. Reproductive: Uterus is absent.  Adnexa are within normal limits. Other: No free-fluid.  Small ventral hernia contains adipose tissue. Musculoskeletal: No lumbar vertebral compression deformity. Lower thoracic vertebral bodies are stable. Postop changes with L5-S1 fusion hardware is stable. Fusion across the disc space has not yet occurred. IMPRESSION: There is wall thickening of the distal sigmoid colon and rectum worrisome for an inflammatory process. Postoperative changes related to L5-S1 fusion hardware. Electronically Signed   By: Marybelle Killings M.D.   On: 01/14/2017 13:43     No results found for this or any previous visit (from the past 72 hour(s)). No results found.    Assessment and Plan: 71 y.o. female with diarrhea and mild abdominal pain with concern for colitis on CT scan. This is concerning for Clostridium difficile or other infectious etiology. Patient was unable to tolerate the conventional oral Cipro Flagyl. Will broaden workup to include stool culture and stool C. difficile test. Additionally we'll  check chest x-ray given the upper quadrant nature of symptoms and check CBC and metabolic panel. If patient has C. difficile we'll use oral vancomycin.   Orders Placed This Encounter  Procedures  . Stool Culture  . DG Chest 2 View    Order Specific Question:   Reason for exam:    Answer:   Cough, assess intra-thoracic pathology    Order Specific Question:   Preferred imaging location?    Answer:   Pharmacist, hospital  [facility]  . C. difficile GDH and Toxin A/B  . CBC  . COMPLETE METABOLIC PANEL WITH GFR   No orders of the defined types were placed in this encounter.    Discussed warning signs or symptoms. Please see discharge instructions. Patient expresses understanding.

## 2017-01-21 NOTE — Telephone Encounter (Signed)
Yes

## 2017-01-21 NOTE — Telephone Encounter (Signed)
Pt is asking is it ok for her to drink protein shakes while on a bland diet. Please advise. -EH/RMA

## 2017-01-21 NOTE — Patient Instructions (Addendum)
Thank you for coming in today. I am worried about C diff.  We are doing blood and stool labs today.  We are also getting a chest xray.  I should have results back tomorrow and rest back in a few days.   If you have C diff I will send a prescription to a compounding pharmacy for Vancomycin.    If your belly pain worsens, or you have high fever, bad vomiting, blood in your stool or black tarry stool go to the Emergency Room.    Clostridium Difficile Infection Clostridium difficile (C. difficile or C. diff) infection causes inflammation of the large intestine (colon). This condition can result in damage to the lining of your colon and may lead to another condition called colitis. This infection can be passed from person to person (is contagious). Follow these instructions at home: Eating and drinking  Drink enough fluid to keep your pee (urine) clear or pale yellow.  Avoid drinking: ? Milk. ? Caffeine. ? Alcohol.  Follow exact instructions from your doctor about how to get enough fluid in your body (rehydrate).  Eat small meals often instead of large meals. Medicines  Take your antibiotic medicine as told by your doctor. Do not stop taking the antibiotic even if you start to feel better unless your doctor told you to do that.  Take over-the-counter and prescription medicines only as told by your doctor.  Do not use medicines to help with watery poop (diarrhea). General instructions  Wash your hands fully before you prepare food and after you use the bathroom. Make sure people who live with you also wash their  hands often.  Clean the surfaces that you touch. Use a product that contains chlorine bleach.  Keep all follow-up visits as told by your doctor. This is important. Contact a doctor if:  Your symptoms do not get better with treatment.  Your symptoms get worse with treatment.  Your symptoms go away and then come back.  You have a fever.  You have new  symptoms. Get help right away if:  You have more pain or tenderness in your belly (abdomen).  Your poop (stool) is mostly bloody.  Your poop looks dark black and tarry.  You cannot eat or drink without throwing up (vomiting).  You have signs of dehydration, such as: ? Dark pee, very little pee, or no pee. ? Cracked lips. ? Not making tears when you cry. ? Dry mouth. ? Sunken eyes. ? Feeling sleepy. ? Feeling weak. ? Feeling dizzy. This information is not intended to replace advice given to you by your health care provider. Make sure you discuss any questions you have with your health care provider. Document Released: 03/31/2009 Document Revised: 11/09/2015 Document Reviewed: 12/05/2014 Elsevier Interactive Patient Education  2017 [employer].

## 2017-01-22 DIAGNOSIS — R197 Diarrhea, unspecified: Secondary | ICD-10-CM | POA: Diagnosis not present

## 2017-01-22 LAB — CBC
HCT: 37.3 % (ref 35.0–45.0)
HEMOGLOBIN: 12.5 g/dL (ref 11.7–15.5)
MCH: 29.6 pg (ref 27.0–33.0)
MCHC: 33.5 g/dL (ref 32.0–36.0)
MCV: 88.2 fL (ref 80.0–100.0)
MPV: 8.6 fL (ref 7.5–12.5)
Platelets: 308 10*3/uL (ref 140–400)
RBC: 4.23 MIL/uL (ref 3.80–5.10)
RDW: 13.3 % (ref 11.0–15.0)
WBC: 5.8 10*3/uL (ref 3.8–10.8)

## 2017-01-22 LAB — COMPLETE METABOLIC PANEL WITH GFR
ALBUMIN: 4.1 g/dL (ref 3.6–5.1)
ALK PHOS: 55 U/L (ref 33–130)
ALT: 19 U/L (ref 6–29)
AST: 24 U/L (ref 10–35)
BUN: 17 mg/dL (ref 7–25)
CHLORIDE: 98 mmol/L (ref 98–110)
CO2: 25 mmol/L (ref 20–32)
Calcium: 8.9 mg/dL (ref 8.6–10.4)
Creat: 1.04 mg/dL — ABNORMAL HIGH (ref 0.60–0.93)
GFR, EST NON AFRICAN AMERICAN: 55 mL/min — AB (ref >=60)
GFR, Est African American: 63 mL/min (ref >=60)
GLUCOSE: 89 mg/dL (ref 65–99)
POTASSIUM: 4.9 mmol/L (ref 3.5–5.3)
SODIUM: 134 mmol/L — AB (ref 135–146)
Total Bilirubin: 0.5 mg/dL (ref 0.2–1.2)
Total Protein: 6.9 g/dL (ref 6.1–8.1)

## 2017-01-23 LAB — C. DIFFICILE GDH AND TOXIN A/B
C. difficile GDH: NOT DETECTED
C. difficile Toxin A/B: NOT DETECTED

## 2017-01-23 NOTE — Telephone Encounter (Signed)
Recommendations left on vm -EH/RMA  

## 2017-01-26 LAB — STOOL CULTURE

## 2017-01-27 ENCOUNTER — Other Ambulatory Visit: Payer: Self-pay | Admitting: Cardiovascular Disease

## 2017-01-27 NOTE — Telephone Encounter (Signed)
An appt is needed for future refill. Please call the office to schedule.

## 2017-01-28 ENCOUNTER — Other Ambulatory Visit: Payer: Self-pay | Admitting: Family Medicine

## 2017-01-29 DIAGNOSIS — I10 Essential (primary) hypertension: Secondary | ICD-10-CM | POA: Diagnosis not present

## 2017-01-29 DIAGNOSIS — K581 Irritable bowel syndrome with constipation: Secondary | ICD-10-CM | POA: Diagnosis not present

## 2017-02-03 DIAGNOSIS — M5416 Radiculopathy, lumbar region: Secondary | ICD-10-CM | POA: Diagnosis not present

## 2017-02-03 DIAGNOSIS — M545 Low back pain: Secondary | ICD-10-CM | POA: Diagnosis not present

## 2017-02-06 DIAGNOSIS — M5416 Radiculopathy, lumbar region: Secondary | ICD-10-CM | POA: Diagnosis not present

## 2017-02-06 DIAGNOSIS — M545 Low back pain: Secondary | ICD-10-CM | POA: Diagnosis not present

## 2017-02-12 ENCOUNTER — Ambulatory Visit: Payer: Medicare Other | Admitting: Physician Assistant

## 2017-02-12 DIAGNOSIS — M545 Low back pain: Secondary | ICD-10-CM | POA: Diagnosis not present

## 2017-02-12 DIAGNOSIS — M5416 Radiculopathy, lumbar region: Secondary | ICD-10-CM | POA: Diagnosis not present

## 2017-02-14 DIAGNOSIS — M545 Low back pain: Secondary | ICD-10-CM | POA: Diagnosis not present

## 2017-02-14 DIAGNOSIS — M5416 Radiculopathy, lumbar region: Secondary | ICD-10-CM | POA: Diagnosis not present

## 2017-02-18 DIAGNOSIS — H25043 Posterior subcapsular polar age-related cataract, bilateral: Secondary | ICD-10-CM | POA: Diagnosis not present

## 2017-02-18 DIAGNOSIS — H02839 Dermatochalasis of unspecified eye, unspecified eyelid: Secondary | ICD-10-CM | POA: Diagnosis not present

## 2017-02-18 DIAGNOSIS — H25013 Cortical age-related cataract, bilateral: Secondary | ICD-10-CM | POA: Diagnosis not present

## 2017-02-18 DIAGNOSIS — H2511 Age-related nuclear cataract, right eye: Secondary | ICD-10-CM | POA: Diagnosis not present

## 2017-02-18 DIAGNOSIS — H2513 Age-related nuclear cataract, bilateral: Secondary | ICD-10-CM | POA: Diagnosis not present

## 2017-02-26 DIAGNOSIS — M545 Low back pain: Secondary | ICD-10-CM | POA: Diagnosis not present

## 2017-02-26 DIAGNOSIS — Z6825 Body mass index (BMI) 25.0-25.9, adult: Secondary | ICD-10-CM | POA: Diagnosis not present

## 2017-02-26 DIAGNOSIS — M5416 Radiculopathy, lumbar region: Secondary | ICD-10-CM | POA: Diagnosis not present

## 2017-02-26 DIAGNOSIS — M48062 Spinal stenosis, lumbar region with neurogenic claudication: Secondary | ICD-10-CM | POA: Diagnosis not present

## 2017-03-03 ENCOUNTER — Encounter: Payer: Self-pay | Admitting: Family Medicine

## 2017-03-03 ENCOUNTER — Ambulatory Visit (INDEPENDENT_AMBULATORY_CARE_PROVIDER_SITE_OTHER): Payer: Medicare Other

## 2017-03-03 ENCOUNTER — Ambulatory Visit (INDEPENDENT_AMBULATORY_CARE_PROVIDER_SITE_OTHER): Payer: Medicare Other | Admitting: Family Medicine

## 2017-03-03 VITALS — BP 142/70 | HR 59 | Temp 98.1°F | Wt 149.0 lb

## 2017-03-03 DIAGNOSIS — I771 Stricture of artery: Secondary | ICD-10-CM | POA: Diagnosis not present

## 2017-03-03 DIAGNOSIS — I6529 Occlusion and stenosis of unspecified carotid artery: Secondary | ICD-10-CM | POA: Diagnosis not present

## 2017-03-03 DIAGNOSIS — M542 Cervicalgia: Secondary | ICD-10-CM | POA: Diagnosis not present

## 2017-03-03 DIAGNOSIS — R05 Cough: Secondary | ICD-10-CM | POA: Diagnosis not present

## 2017-03-03 DIAGNOSIS — R0602 Shortness of breath: Secondary | ICD-10-CM

## 2017-03-03 NOTE — Progress Notes (Signed)
Traci [name] is a 71 y.o. female who presents to [facility]: [place] today for left neck pain.   Texanna notes a 5 day history of left-sided neck pain associated with some swelling and muscle aches and pain. She notes cough congestion and runny nose as well as some mild diarrhea. She's tried some Tylenol which has helped. She notes that the anterior superior chest wall and neck are tender but there is no rash. She denies any pain radiating down her arm and denies any numbness. She has diffuse muscle aches however. She has a pertinent past medical and surgical history for vasculopathy with a left subclavian bypass.  She incidentally notes a spider bite on her right forearm occurring about 7 days ago without any resulting rash pain or fever. She thinks it's unrelated.   Past Medical History:  Diagnosis Date  . Anxiety   . Atrial fibrillation ([place])   . CAD (coronary artery disease)   . Claudication ([place]) 01/06/2008   Lower extremity dopplers - no evidence of arterial insufficiency, normal exam  . Coronary artery disease   . GERD (gastroesophageal reflux disease)   . Hyperlipidemia   . Hypertension 11/30/2010   echo- EF 55%; normal w/ mildly sclerotic aortic valve  . Hypertension 11/05/11   renal dopplers - celiac artery and SMA >50% diameter reduction, R renal artery - mildly elevated velocities 1-59% diameter reduction, L renal artery normal  . Nonspecific ST-T wave electrocardiographic changes 03/26/2011   R/Lmv - EF 74%, normal perfusion all regions, ST depression w/ Lexiscan infusion w/o assoc angina  . Osteopenia   . PAD (peripheral artery disease) ([place])   . Peripheral vascular disease ([place])   . PVD (peripheral vascular disease) ([place]) 11/05/2011   doppler - R/L brachial pressures essentially equal w/o inflow disease; L sublclavian/CCA bypass graft demonstrates  patent flow, no evidence of significant stenosis  . Sigmoid diverticulitis    Past Surgical History:  Procedure Laterality Date  . ABDOMINAL AORTIC ANEURYSM REPAIR N/A 01/27/2014   Procedure: AORTO-SUPERIOR MESENTERIC ARTERY BYPASS GRAFT;  Surgeon: Angelia Mould, MD;  Location: [place];  Service: Vascular;  Laterality: N/A;  . ABDOMINAL HYSTERECTOMY    . APPENDECTOMY    . CARDIAC CATHETERIZATION  06/03/2008   60% LAD, involving D2, borderline significant by IVUS, medical therapy. CFX, RCA OK.  . CHOLECYSTECTOMY    . EYE SURGERY     retinal surgery  . SPINE SURGERY  Feb. 2011  . [place]   X's  several  . SUBCLAVIAN ARTERY STENT Left 09/20/2008   LSA ISR 7x59mm Cordis Genesis on Opta premount, reduction from 80% to 0%  . subclavian artery stents  01/23/2010   Left carotid to subclavian artery Bypass  . VISCERAL ANGIOGRAM  01/17/2014   Procedure: VISCERAL ANGIOGRAM;  Surgeon: Angelia Mould, MD;  Location: [facility] CATH LAB;  Service: Cardiovascular;;   Social History  Substance Use Topics  . Smoking status: Former Smoker    Packs/day: 0.25    Years: 20.00    Types: Cigarettes    Quit date: 09/30/1993  . Smokeless tobacco: Never Used  . Alcohol use No   family history includes Diabetes in her maternal grandmother; Heart attack in her father; Heart disease in her paternal grandfather; Heart disease (age of onset: 69) in her father; Hyperlipidemia in her father; Hypertension in her father; Kidney disease in her father; Kidney failure in her mother.  ROS as above:  Medications: Current Outpatient Prescriptions  Medication Sig Dispense Refill  . aspirin EC 81 MG tablet Take 81 mg by mouth daily.    Marland Kitchen atenolol (TENORMIN) 25 MG tablet Take 1 tablet (25 mg total) by mouth daily. 90 tablet 3  . atenolol (TENORMIN) 25 MG tablet TAKE 1 TABLET DAILY 90 tablet 0  . atorvastatin (LIPITOR) 10 MG tablet Take 1 tablet (10 mg total) by mouth every evening. An appt is  needed for future refill. Please call the office to schedule. 90 tablet 0  . candesartan (ATACAND) 16 MG tablet TAKE ONE-HALF (1/2) TABLET EVERY MORNING 45 tablet 0  . cholecalciferol (VITAMIN D) 1000 UNITS tablet Take 1,000 Units by mouth daily.    . clopidogrel (PLAVIX) 75 MG tablet Take 1 tablet (75 mg total) by mouth daily. An appt is needed for future refill. Please call the office to schedule. 90 tablet 0  . clorazepate (TRANXENE) 7.5 MG tablet Take 1 tablet (7.5 mg total) by mouth daily as needed. 90 tablet 1  . cyanocobalamin 1000 MCG tablet Take 100 mcg by mouth daily.    Marland Kitchen doxycycline (VIBRAMYCIN) 100 MG capsule Take 1 capsule (100 mg total) by mouth 2 (two) times daily. Take with food. 14 capsule 0  . EPINEPHrine (EPIPEN 2-PAK) 0.3 mg/0.3 mL IJ SOAJ injection Inject 0.3 mLs (0.3 mg total) into the muscle as needed (for allergic reaction). 2 Device 0  . esomeprazole (NEXIUM) 40 MG capsule Take 1 capsule (40 mg total) by mouth daily. Take 1 tab daily (Patient taking differently: Take 40 mg by mouth daily. ) 90 capsule 0  . estradiol (ESTRACE) 0.5 MG tablet TAKE 1 TABLET DAILY 90 tablet 1  . furosemide (LASIX) 20 MG tablet TAKE 1 TABLET EVERY OTHER DAY 45 tablet 1  . ketoconazole (NIZORAL) 2 % shampoo Apply once daily and wash off for one week. 120 mL 0  . lidocaine (LIDODERM) 5 % Place 1 patch onto the skin daily. Remove & Discard patch within 12 hours or as directed by MD 30 patch 0  . linaclotide (LINZESS) 145 MCG CAPS capsule Take 1 capsule by mouth daily as needed.    . meclizine (ANTIVERT) 25 MG tablet TAKE ONE-HALF (1/2) TABLET (12.5 MG) THREE TIMES A DAY AS NEEDED FOR DIZZINESS OR NAUSEA 90 tablet 1  . Multiple Vitamin (MULTIVITAMIN WITH MINERALS) TABS Take 1 tablet by mouth daily.    . polyethylene glycol (MIRALAX / GLYCOLAX) packet Take 17 g by mouth 2 (two) times daily. Until stooling regularly 30 packet 11  . zolpidem (AMBIEN) 10 MG tablet Take 1 tablet (10 mg total) by mouth at  bedtime as needed for sleep. 90 tablet 0   No current facility-administered medications for this visit.    Allergies  Allergen Reactions  . Cortisone Anaphylaxis  . Dilaudid [Hydromorphone Hcl] Nausea And Vomiting    Pt states she will start vomiting immediately for 6 hours  . Iodine Anaphylaxis  . Medrol [Methylprednisolone] Anaphylaxis  . Omnipaque [Iohexol] Anaphylaxis  . Prednisone Anaphylaxis  . Shellfish Allergy Anaphylaxis  . Sulfa Drugs Cross Reactors Anaphylaxis  . Augmentin [Amoxicillin-Pot Clavulanate]     Upset stomach  . Morphine And Related     nausea  . Codeine Rash  . Erythromycin Rash    Health Maintenance Health Maintenance  Topic Date Due  . MAMMOGRAM  11/30/2018  . TETANUS/TDAP  06/17/2020  . COLONOSCOPY  07/16/2025  . INFLUENZA VACCINE  Completed  . DEXA  SCAN  Completed  . Hepatitis C Screening  Completed  . PNA vac Low Risk Adult  Completed     Exam:  BP (!) 142/70   Pulse (!) 59   Temp 98.1 F (36.7 C) (Oral)   Wt 149 lb (67.6 kg)   SpO2 99%   BMI 25.58 kg/m  Gen: Well NAD HEENT: EOMI,  MMM Lungs: Normal work of breathing. CTABL Heart: RRR no MRG Abd: NABS, Soft. Nondistended, Nontender Exts: Brisk capillary refill, warm and well perfused.  Left neck is normal-appearing. She is tender to palpation of the left trapezius and along the anterior lymph chain. There is only minimal lip swelling however her left clavicle area is tender to palpation. She has no carotid bruits.  Pulses are intact and the wrists bilaterally   No results found for this or any previous visit (from the past 72 hour(s)). No results found.    Assessment and Plan: 71 y.o. female with left neck pain. Etiology is unclear. I think she probably has a respiratory virus and potentially some trapezius irritation and spasm. However she does have a concerning medical history. Plan for chest x-ray to assess for pneumonia or mass as well as limited workup including CBC and  metabolic panel and CK. Additionally we'll arrange for a carotid ultrasound next week. Treat with Tylenol and recheck if not improving. Return sooner if worsening.   Orders Placed This Encounter  Procedures  . DG Chest 2 View    Order Specific Question:   Reason for exam:    Answer:   Cough, assess intra-thoracic pathology    Order Specific Question:   Preferred imaging location?    Answer:   [facility]  . CBC  . COMPLETE METABOLIC PANEL WITH GFR  . CK   No orders of the defined types were placed in this encounter.    Discussed warning signs or symptoms. Please see discharge instructions. Patient expresses understanding.

## 2017-03-03 NOTE — Patient Instructions (Signed)
Thank you for coming in today. Get labs and cxr today.  We will arrange for a neck ultrasound in the near future.  Let me know how you are doing.  Return sooner if needed.  Continue tylenol for pain and fever and swelling.

## 2017-03-04 LAB — CBC
HEMATOCRIT: 35.8 % (ref 35.0–45.0)
HEMOGLOBIN: 12.2 g/dL (ref 11.7–15.5)
MCH: 29.8 pg (ref 27.0–33.0)
MCHC: 34.1 g/dL (ref 32.0–36.0)
MCV: 87.3 fL (ref 80.0–100.0)
MPV: 10.1 fL (ref 7.5–12.5)
Platelets: 289 10*3/uL (ref 140–400)
RBC: 4.1 10*6/uL (ref 3.80–5.10)
RDW: 12.3 % (ref 11.0–15.0)
WBC: 6 10*3/uL (ref 3.8–10.8)

## 2017-03-04 LAB — COMPLETE METABOLIC PANEL WITH GFR
AG Ratio: 1.5 (calc) (ref 1.0–2.5)
ALT: 16 U/L (ref 6–29)
AST: 23 U/L (ref 10–35)
Albumin: 4.1 g/dL (ref 3.6–5.1)
Alkaline phosphatase (APISO): 53 U/L (ref 33–130)
BUN: 12 mg/dL (ref 7–25)
CALCIUM: 8.7 mg/dL (ref 8.6–10.4)
CO2: 26 mmol/L (ref 20–32)
CREATININE: 0.91 mg/dL (ref 0.60–0.93)
Chloride: 104 mmol/L (ref 98–110)
GFR, EST AFRICAN AMERICAN: 74 mL/min/{1.73_m2} (ref >=60)
GFR, EST NON AFRICAN AMERICAN: 64 mL/min/{1.73_m2} (ref >=60)
GLUCOSE: 101 mg/dL — AB (ref 65–99)
Globulin: 2.8 g/dL (calc) (ref 1.9–3.7)
Potassium: 4.5 mmol/L (ref 3.5–5.3)
Sodium: 139 mmol/L (ref 135–146)
TOTAL PROTEIN: 6.9 g/dL (ref 6.1–8.1)
Total Bilirubin: 0.4 mg/dL (ref 0.2–1.2)

## 2017-03-04 LAB — CK: Total CK: 66 U/L (ref 29–143)

## 2017-03-05 ENCOUNTER — Ambulatory Visit (HOSPITAL_COMMUNITY)
Admission: RE | Admit: 2017-03-05 | Discharge: 2017-03-05 | Disposition: A | Discharge status: Home / Self Care | Payer: Medicare Other | Source: Ambulatory Visit | Attending: Vascular Surgery | Admitting: Vascular Surgery

## 2017-03-05 DIAGNOSIS — M542 Cervicalgia: Secondary | ICD-10-CM | POA: Insufficient documentation

## 2017-03-05 DIAGNOSIS — I771 Stricture of artery: Secondary | ICD-10-CM

## 2017-03-05 DIAGNOSIS — I6523 Occlusion and stenosis of bilateral carotid arteries: Secondary | ICD-10-CM | POA: Diagnosis not present

## 2017-03-06 ENCOUNTER — Telehealth: Payer: Self-pay

## 2017-03-06 MED ORDER — CEFDINIR 300 MG PO CAPS: 300.0000 mg | ORAL_CAPSULE | Freq: Two times a day (BID) | ORAL | 0 refills | Status: DC

## 2017-03-06 NOTE — Telephone Encounter (Signed)
Did you receive my earlier message?

## 2017-03-06 NOTE — Telephone Encounter (Signed)
Seen on Monday and since has developed enlarged lymph nodes in neck, ear pain on the left, severe headache, throat feels swollen.  Please advise if she needs an appointment or if you can send something in for her.  If you are sending a script, [facility] on [address], but she will come back in if you feel you need to see her.

## 2017-03-06 NOTE — Telephone Encounter (Signed)
Notified patient.

## 2017-03-06 NOTE — Telephone Encounter (Signed)
Omnicef antibiotic sent to pharmacy.  Follow up with not better.

## 2017-03-07 DIAGNOSIS — R197 Diarrhea, unspecified: Secondary | ICD-10-CM | POA: Diagnosis not present

## 2017-03-07 DIAGNOSIS — B349 Viral infection, unspecified: Secondary | ICD-10-CM | POA: Diagnosis not present

## 2017-03-07 DIAGNOSIS — I10 Essential (primary) hypertension: Secondary | ICD-10-CM | POA: Diagnosis not present

## 2017-03-20 DIAGNOSIS — M5416 Radiculopathy, lumbar region: Secondary | ICD-10-CM | POA: Diagnosis not present

## 2017-03-20 DIAGNOSIS — M545 Low back pain: Secondary | ICD-10-CM | POA: Diagnosis not present

## 2017-03-25 ENCOUNTER — Encounter: Payer: Self-pay | Admitting: Cardiovascular Disease

## 2017-03-25 ENCOUNTER — Ambulatory Visit (INDEPENDENT_AMBULATORY_CARE_PROVIDER_SITE_OTHER): Payer: Medicare Other | Admitting: Cardiovascular Disease

## 2017-03-25 VITALS — BP 118/62 | HR 66 | Ht 64.0 in | Wt 152.0 lb

## 2017-03-25 DIAGNOSIS — E785 Hyperlipidemia, unspecified: Secondary | ICD-10-CM | POA: Diagnosis not present

## 2017-03-25 DIAGNOSIS — I700 Atherosclerosis of aorta: Secondary | ICD-10-CM | POA: Diagnosis not present

## 2017-03-25 DIAGNOSIS — I771 Stricture of artery: Secondary | ICD-10-CM

## 2017-03-25 DIAGNOSIS — K551 Chronic vascular disorders of intestine: Secondary | ICD-10-CM

## 2017-03-25 DIAGNOSIS — I2510 Atherosclerotic heart disease of native coronary artery without angina pectoris: Secondary | ICD-10-CM | POA: Diagnosis not present

## 2017-03-25 NOTE — Patient Instructions (Signed)
Dr Croitoru recommends that you continue on your current medications as directed. Please refer to the Current Medication list given to you today.  Your physician recommends that you return for lab work at your convenience - FASTING.  Dr Croitoru recommends that you schedule a follow-up appointment in 12 months. You will receive a reminder letter in the mail two months in advance. If you don't receive a letter, please call our office to schedule the follow-up appointment.  If you need a refill on your cardiac medications before your next appointment, please call your pharmacy. 

## 2017-03-25 NOTE — Progress Notes (Signed)
Patient ID: Traci [name], female   DOB: [birth year], 71 y.o.   MRN: [id number]    Cardiology Office Note    Date:  03/25/2017   ID:  MAMYE BOLDS, DOB 20-Dec-1945, MRN [id number]  PCP:  Gregor Hams, MD  Cardiologist:   Sanda Klein, MD   Chief Complaint  Patient presents with  . Follow-up    SOB, pain in calf muscles, left arm aches, cataract surgery 10/19 & 11/2    History of Present Illness:  Traci [name] is a 71 y.o. female with an extensive history of peripheral arterial disease involving the mesenteric circulation and upper extremities, mild nonobstructive coronary atherosclerosis, hyperlipidemia, hypertension..   She is worried because a recent chest x-ray described atherosclerosis of the thoracic aorta. She was concerned that this means that she'll develop a stenosis in the aorta here and she has not had any new neurological complaints, denies intermittent claudication, intestinal angina or exertional angina pectoris. In fact she does not have any cardiovascular complaints today. She is quite active. She walks twice a day if the weather allows. On some days she will do water aerobics as well. She sometimes wakes up in the middle the night and notes that her blood pressure is quite high, like 190/110, but if she waits over. Over the next 20-30 minutes her blood pressure returns to normal.  She had a normal nuclear stress test in October 2017. She had carotid duplex ultrasonography last performed in March 2018, without evidence of significant obstruction. She has a left carotid-subclavian bypass graft without evidence of stenosis. The aortic SMA bypass was noted on abdominal CT performed in July 2018, although no comment was made about its patency. There was no evidence of aortic aneurysm and no comment made about the renal arteries.  Traci [name] has long-standing history of peripheral arterial disease with previous stents and surgical  revascularization of the left subclavian artery for subclavian steal syndrome and presyncope. She eventually underwent a left carotid to subclavian bypass.  She underwent a bypass from the aorta to the chronically totally occluded superficial mesenteric artery due to symptoms of chronic mesenteric ischemia, (Dr. Scot Dock August 2015 6 mm Dacron graft) She had moderate CAD by coronary angiography performed in 2009. Normal left ventricular systolic function and no perfusion abnormalities by echo 2015 and nuclear stress test performed in 2014.   Past Medical History:  Diagnosis Date  . Anxiety   . Atrial fibrillation ([place])   . CAD (coronary artery disease)   . Claudication ([place]) 01/06/2008   Lower extremity dopplers - no evidence of arterial insufficiency, normal exam  . Coronary artery disease   . GERD (gastroesophageal reflux disease)   . Hyperlipidemia   . Hypertension 11/30/2010   echo- EF 55%; normal w/ mildly sclerotic aortic valve  . Hypertension 11/05/11   renal dopplers - celiac artery and SMA >50% diameter reduction, R renal artery - mildly elevated velocities 1-59% diameter reduction, L renal artery normal  . Nonspecific ST-T wave electrocardiographic changes 03/26/2011   R/Lmv - EF 74%, normal perfusion all regions, ST depression w/ Lexiscan infusion w/o assoc angina  . Osteopenia   . PAD (peripheral artery disease) ([place])   . Peripheral vascular disease ([place])   . PVD (peripheral vascular disease) ([place]) 11/05/2011   doppler - R/L brachial pressures essentially equal w/o inflow disease; L sublclavian/CCA bypass graft demonstrates patent flow, no evidence of significant stenosis  . Sigmoid diverticulitis     Past Surgical History:  Procedure  Laterality Date  . ABDOMINAL AORTIC ANEURYSM REPAIR N/A 01/27/2014   Procedure: AORTO-SUPERIOR MESENTERIC ARTERY BYPASS GRAFT;  Surgeon: Angelia Mould, MD;  Location: [facility];  Service: Vascular;  Laterality: N/A;  . ABDOMINAL HYSTERECTOMY     . APPENDECTOMY    . CARDIAC CATHETERIZATION  06/03/2008   60% LAD, involving D2, borderline significant by IVUS, medical therapy. CFX, RCA OK.  . CHOLECYSTECTOMY    . EYE SURGERY     retinal surgery  . SPINE SURGERY  Feb. 2011  . [facility]   X's  several  . SUBCLAVIAN ARTERY STENT Left 09/20/2008   LSA ISR 7x41mm Cordis Genesis on Opta premount, reduction from 80% to 0%  . subclavian artery stents  01/23/2010   Left carotid to subclavian artery Bypass  . VISCERAL ANGIOGRAM  01/17/2014   Procedure: VISCERAL ANGIOGRAM;  Surgeon: Angelia Mould, MD;  Location: [facility] C[facility] LAB;  Service: Cardiovascular;;    Outpatient Medications Prior to Visit  Medication Sig Dispense Refill  . aspirin EC 81 MG tablet Take 81 mg by mouth daily.    Marland Kitchen atenolol (TENORMIN) 25 MG tablet TAKE 1 TABLET DAILY 90 tablet 0  . atorvastatin (LIPITOR) 10 MG tablet Take 1 tablet (10 mg total) by mouth every evening. An appt is needed for future refill. Please call the office to schedule. 90 tablet 0  . candesartan (ATACAND) 16 MG tablet TAKE ONE-HALF (1/2) TABLET EVERY MORNING 45 tablet 0  . cholecalciferol (VITAMIN D) 1000 UNITS tablet Take 1,000 Units by mouth daily.    . clopidogrel (PLAVIX) 75 MG tablet Take 1 tablet (75 mg total) by mouth daily. An appt is needed for future refill. Please call the office to schedule. 90 tablet 0  . clorazepate (TRANXENE) 7.5 MG tablet Take 1 tablet (7.5 mg total) by mouth daily as needed. 90 tablet 1  . cyanocobalamin 1000 MCG tablet Take 100 mcg by mouth daily.    Marland Kitchen EPINEPHrine (EPIPEN 2-PAK) 0.3 mg/0.3 mL IJ SOAJ injection Inject 0.3 mLs (0.3 mg total) into the muscle as needed (for allergic reaction). 2 Device 0  . esomeprazole (NEXIUM) 40 MG capsule Take 1 capsule (40 mg total) by mouth daily. Take 1 tab daily (Patient taking differently: Take 40 mg by mouth daily. ) 90 capsule 0  . estradiol (ESTRACE) 0.5 MG tablet TAKE 1 TABLET DAILY 90 tablet 1  .  furosemide (LASIX) 20 MG tablet TAKE 1 TABLET EVERY OTHER DAY 45 tablet 1  . ketoconazole (NIZORAL) 2 % shampoo Apply once daily and wash off for one week. 120 mL 0  . lidocaine (LIDODERM) 5 % Place 1 patch onto the skin daily. Remove & Discard patch within 12 hours or as directed by MD 30 patch 0  . linaclotide (LINZESS) 145 MCG CAPS capsule Take 1 capsule by mouth daily as needed.    . meclizine (ANTIVERT) 25 MG tablet TAKE ONE-HALF (1/2) TABLET (12.5 MG) THREE TIMES A DAY AS NEEDED FOR DIZZINESS OR NAUSEA 90 tablet 1  . Multiple Vitamin (MULTIVITAMIN WITH MINERALS) TABS Take 1 tablet by mouth daily.    . polyethylene glycol (MIRALAX / GLYCOLAX) packet Take 17 g by mouth 2 (two) times daily. Until stooling regularly (Patient taking differently: Take 17 g by mouth as needed. Until stooling regularly) 30 packet 11  . zolpidem (AMBIEN) 10 MG tablet Take 1 tablet (10 mg total) by mouth at bedtime as needed for sleep. 90 tablet 0  . atenolol (TENORMIN)  25 MG tablet Take 1 tablet (25 mg total) by mouth daily. (Patient not taking: Reported on 03/25/2017) 90 tablet 3  . cefdinir (OMNICEF) 300 MG capsule Take 1 capsule (300 mg total) by mouth 2 (two) times daily. (Patient not taking: Reported on 03/25/2017) 14 capsule 0  . doxycycline (VIBRAMYCIN) 100 MG capsule Take 1 capsule (100 mg total) by mouth 2 (two) times daily. Take with food. (Patient not taking: Reported on 03/25/2017) 14 capsule 0   No facility-administered medications prior to visit.      Allergies:   Cortisone; Dilaudid [hydromorphone hcl]; Iodine; Medrol [methylprednisolone]; Omnipaque [iohexol]; Prednisone; Shellfish allergy; Sulfa drugs cross reactors; Augmentin [amoxicillin-pot clavulanate]; Codeine; Erythromycin; and Morphine and related   Social History   Social History  . Marital status: Legally Separated    Spouse name: N/A  . Number of children: N/A  . Years of education: N/A   Social History Main Topics  . Smoking status:  Former Smoker    Packs/day: 0.25    Years: 20.00    Types: Cigarettes    Quit date: 09/30/1993  . Smokeless tobacco: Never Used  . Alcohol use No  . Drug use: No  . Sexual activity: No   Other Topics Concern  . Not on file   Social History Narrative  . No narrative on file     Family History:  The patient's family history includes Diabetes in her maternal grandmother; Heart attack in her father; Heart disease in her paternal grandfather; Heart disease (age of onset: 78) in her father; Hyperlipidemia in her father; Hypertension in her father; Kidney disease in her father; Kidney failure in her mother.   ROS:   Please see the history of present illness.    ROS All other systems reviewed and are negative.   PHYSICAL EXAM:   VS:  BP 118/62   Pulse 66   Ht 5\' 4"  (1.626 m)   Wt 152 lb (68.9 kg)   BMI 26.09 kg/m    No difference in blood pressures between the right and left upper extremities General: Alert, oriented x3, no distress, lean Head: no evidence of trauma, PERRL, EOMI, no exophtalmos or lid lag, no myxedema, no xanthelasma; normal ears, nose and oropharynx Neck: normal jugular venous pulsations and no hepatojugular reflux; brisk carotid pulses without delay; faint bilateral carotid bruits (R>L), no carotid delay or masses; scar of previous carotid-subclavian bypass surgery. She has bilateral subclavian bruits (L>R). Chest: clear to auscultation, no signs of consolidation by percussion or palpation, normal fremitus, symmetrical and full respiratory excursions Cardiovascular: normal position and quality of the apical impulse, regular rhythm, normal first and second heart sounds, no murmurs, rubs or gallops Abdomen: no tenderness or distention, no masses by palpation, no abnormal pulsatility or arterial bruits, normal bowel sounds, no hepatosplenomegaly Extremities: no clubbing, cyanosis or edema; 2+ radial, ulnar and brachial pulses bilaterally; 2+ right femoral, posterior  tibial and dorsalis pedis pulses; 2+ left femoral, posterior tibial and dorsalis pedis pulses; no subclavian or femoral bruits Neurological: grossly nonfocal Psych: Normal mood and affect   Wt Readings from Last 3 Encounters:  03/25/17 152 lb (68.9 kg)  03/03/17 149 lb (67.6 kg)  01/21/17 149 lb (67.6 kg)      Studies/Labs Reviewed:   EKG:  EKG is not ordered today.  The ekg  from 01/14/2017 is a normal tracing.  Recent Labs: 03/03/2017: ALT 16; BUN 12; Creat 0.91; Hemoglobin 12.2; Platelets 289; Potassium 4.5; Sodium 139   Lipid Panel  Component Value Date/Time   CHOL 126 02/07/2016 0822   CHOL 141 04/07/2013 0857   TRIG 130 02/07/2016 0822   HDL 46 02/07/2016 0822   HDL 47 04/07/2013 0857   CHOLHDL 2.7 02/07/2016 0822   VLDL 26 02/07/2016 0822   LDLCALC 54 02/07/2016 0822   LDLCALC 67 04/07/2013 0857    ASSESSMENT:    1. 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 Subclavian artery stenosis, left ([place])   4. Dyslipidemia   5. Aortic atherosclerosis ([facility])      PLAN:  In order of problems listed above:  1. CAD: Normal recent nuclear stress test, no complaints of chest pain, known moderate stenosis in the LAD artery by previous coronary angiography. 2. SMA stenosis: Does not have symptoms of intestinal angina. Status post aorto mesenteric bypass. 3. L SCL artery occlusion: Left carotid subclavian bypass is widely patent on recent ultrasound and she does not have any symptoms of upper extremity claudication or subclavian steal syndrome 4. HLP: Last year's lipid profile shows parameters at target with LDL under 70. Time for repeat 5. Ao atherosclerosis: Spleen that the calcification seen in her aortic arch on chest radiograph is neither new, nor unexpected, considering the extent of her vascular problems. No specific therapy is required other than the lipid lowering, antihypertensive antiplatelet therapy that she is already  receiving.   Medication Adjustments/Labs and Tests Ordered: Current medicines are reviewed at length with the patient today.  Concerns regarding medicines are outlined above.  Medication changes, Labs and Tests ordered today are listed in the Patient Instructions below. Patient Instructions  Dr Sallyanne Kuster recommends that you continue on your current medications as directed. Please refer to the Current Medication list given to you today.  Your physician recommends that you return for lab work at your convenience - FASTING.  Dr Sallyanne Kuster recommends that you schedule a follow-up appointment in 12 months. You will receive a reminder letter in the mail two months in advance. If you don't receive a letter, please call our office to schedule the follow-up appointment.  If you need a refill on your cardiac medications before your next appointment, please call your pharmacy.      Signed, Sanda Klein, MD  03/25/2017 5:50 PM    [facility] [place], [place], [place]  [address] Phone: [phone]; Fax: [phone]

## 2017-03-29 ENCOUNTER — Other Ambulatory Visit: Payer: Self-pay | Admitting: Cardiovascular Disease

## 2017-04-02 ENCOUNTER — Telehealth: Payer: Self-pay | Admitting: Cardiovascular Disease

## 2017-04-02 DIAGNOSIS — E785 Hyperlipidemia, unspecified: Secondary | ICD-10-CM | POA: Diagnosis not present

## 2017-04-02 NOTE — Telephone Encounter (Signed)
Mrs. Traci [name] went to have the labs drawn and took the order to the lab and it was not realized that the order was for [facility] until after the fact so you will have to call this number to get the results . [phone] [facility])   Thanks

## 2017-04-03 DIAGNOSIS — M545 Low back pain: Secondary | ICD-10-CM | POA: Diagnosis not present

## 2017-04-03 DIAGNOSIS — M5416 Radiculopathy, lumbar region: Secondary | ICD-10-CM | POA: Diagnosis not present

## 2017-04-04 ENCOUNTER — Other Ambulatory Visit: Payer: Self-pay | Admitting: Cardiovascular Disease

## 2017-04-04 DIAGNOSIS — H2512 Age-related nuclear cataract, left eye: Secondary | ICD-10-CM | POA: Diagnosis not present

## 2017-04-04 DIAGNOSIS — H2511 Age-related nuclear cataract, right eye: Secondary | ICD-10-CM | POA: Diagnosis not present

## 2017-04-04 DIAGNOSIS — H2513 Age-related nuclear cataract, bilateral: Secondary | ICD-10-CM | POA: Diagnosis not present

## 2017-04-04 DIAGNOSIS — H25811 Combined forms of age-related cataract, right eye: Secondary | ICD-10-CM | POA: Diagnosis not present

## 2017-04-14 ENCOUNTER — Ambulatory Visit (INDEPENDENT_AMBULATORY_CARE_PROVIDER_SITE_OTHER): Payer: Medicare Other

## 2017-04-14 ENCOUNTER — Encounter: Payer: Self-pay | Admitting: Sports Medicine

## 2017-04-14 ENCOUNTER — Ambulatory Visit (INDEPENDENT_AMBULATORY_CARE_PROVIDER_SITE_OTHER): Payer: Medicare Other | Admitting: Sports Medicine

## 2017-04-14 VITALS — BP 130/75 | HR 64 | Resp 16 | Wt 150.0 lb

## 2017-04-14 DIAGNOSIS — M79671 Pain in right foot: Secondary | ICD-10-CM | POA: Diagnosis not present

## 2017-04-14 DIAGNOSIS — S86011A Strain of right Achilles tendon, initial encounter: Secondary | ICD-10-CM

## 2017-04-14 DIAGNOSIS — M7989 Other specified soft tissue disorders: Secondary | ICD-10-CM | POA: Diagnosis not present

## 2017-04-14 NOTE — Assessment & Plan Note (Signed)
Cam boot for 2 weeks. Rehab exercises given. We will probably advance her to a regular shoe with a heel lift in 2 weeks.

## 2017-04-14 NOTE — Progress Notes (Signed)
   Subjective:    I'm seeing this patient as a consultation for: Dr. Lynne Leader  CC: Right foot/calf injury  HPI: This is a pleasant 71 year old female, she took a misstep, inverted her right foot and ankle, had immediate pain, no bruising but mild swelling on the posterior aspect of the ankle.  Moderate, persistent without radiation.  Past medical history, Surgical history, Family history not pertinant except as noted below, Social history, Allergies, and medications have been entered into the medical record, reviewed, and no changes needed.   Review of Systems: No headache, visual changes, nausea, vomiting, diarrhea, constipation, dizziness, abdominal pain, skin rash, fevers, chills, night sweats, weight loss, swollen lymph nodes, body aches, joint swelling, muscle aches, chest pain, shortness of breath, mood changes, visual or auditory hallucinations.   Objective:   General: Well Developed, well nourished, and in no acute distress.  Neuro:  Extra-ocular muscles intact, able to move all 4 extremities, sensation grossly intact.  Deep tendon reflexes tested were normal. Psych: Alert and oriented, mood congruent with affect. ENT:  Ears and nose appear unremarkable.  Hearing grossly normal. Neck: Unremarkable overall appearance, trachea midline.  No visible thyroid enlargement. Eyes: Conjunctivae and lids appear unremarkable.  Pupils equal and round. Skin: Warm and dry, no rashes noted.  Cardiovascular: Pulses palpable, no extremity edema. Right ankle: Minimal swelling, tenderness at the musculotendinous junction of the gastroc and the Achilles Range of motion is full in all directions. Strength is 5/5 in all directions. Stable lateral and medial ligaments; squeeze test and kleiger test unremarkable; Talar dome nontender; No pain at base of 5th MT; No tenderness over cuboid; No tenderness over N spot or navicular prominence No tenderness on posterior aspects of lateral and medial  malleolus No sign of peroneal tendon subluxations; Negative tarsal tunnel tinel's Able to walk 4 steps. Right foot: No visible erythema or swelling. Range of motion is full in all directions. Strength is 5/5 in all directions. No hallux valgus. No pes cavus or pes planus. No abnormal callus noted. No pain over the navicular prominence, or base of fifth metatarsal. No tenderness to palpation of the calcaneal insertion of plantar fascia. No pain at the Achilles insertion. No pain over the calcaneal bursa. No pain of the retrocalcaneal bursa. No tenderness to palpation over the tarsals, metatarsals, or phalanges. No hallux rigidus or limitus. No tenderness palpation over interphalangeal joints. No pain with compression of the metatarsal heads. Neurovascularly intact distally.  Foot x-rays reviewed and are negative with the exception of some abnormal disruption of the sesamoid bones under the big toe.  Impression and Recommendations:   This case required medical decision making of moderate complexity.  Strain of right Achilles tendon Cam boot for 2 weeks. Rehab exercises given. We will probably advance her to a regular shoe with a heel lift in 2 weeks. ___________________________________________ Gwen Her. Dianah Field, M.D., ABFM., CAQSM. Primary Care and [place] In[employer] of [employer] [facility]

## 2017-04-18 DIAGNOSIS — H2513 Age-related nuclear cataract, bilateral: Secondary | ICD-10-CM | POA: Diagnosis not present

## 2017-04-18 DIAGNOSIS — H2512 Age-related nuclear cataract, left eye: Secondary | ICD-10-CM | POA: Diagnosis not present

## 2017-04-28 ENCOUNTER — Ambulatory Visit: Payer: Medicare Other | Admitting: Sports Medicine

## 2017-04-28 ENCOUNTER — Other Ambulatory Visit: Payer: Self-pay | Admitting: Cardiovascular Disease

## 2017-04-28 NOTE — Telephone Encounter (Signed)
Rx has been sent to the pharmacy electronically. ° °

## 2017-04-29 ENCOUNTER — Other Ambulatory Visit: Payer: Self-pay | Admitting: *Deleted

## 2017-04-29 MED ORDER — CLORAZEPATE DIPOTASSIUM 7.5 MG PO TABS: 7.5000 mg | ORAL_TABLET | Freq: Every day | ORAL | 1 refills | Status: DC | PRN

## 2017-05-02 ENCOUNTER — Other Ambulatory Visit: Payer: Self-pay | Admitting: Family Medicine

## 2017-05-05 ENCOUNTER — Telehealth: Payer: Self-pay

## 2017-05-05 NOTE — Telephone Encounter (Signed)
Patient called back about mail order refill clorazepate being sent to [facility] and they never received script from 04/29/17 and adv that our office adv her a new prescription number is needed every time a new prescription is sent in by Dr. Georgina Snell even though its the same drug. Pt was calling back to verify if a new prescription was sent since it is a controlled drug every time he sends it to them. If you can fax asap she is out. Thanks

## 2017-05-05 NOTE — Telephone Encounter (Signed)
LMOM for patient to call back was unclear of what Rx is needed to be refilled. Patient need to let us know which medication is required.

## 2017-05-06 NOTE — Telephone Encounter (Signed)
Please advise about this ?

## 2017-05-07 MED ORDER — CLORAZEPATE DIPOTASSIUM 7.5 MG PO TABS: 7.5000 mg | ORAL_TABLET | Freq: Every day | ORAL | 1 refills | Status: DC | PRN

## 2017-05-07 NOTE — Telephone Encounter (Signed)
Patient notified

## 2017-05-07 NOTE — Addendum Note (Signed)
Addended by: Gregor Hams on: 05/07/2017 07:03 AM   Modules accepted: Orders

## 2017-05-07 NOTE — Telephone Encounter (Signed)
Medicine sent an an E-script today. Please inform pt

## 2017-05-30 DIAGNOSIS — H43813 Vitreous degeneration, bilateral: Secondary | ICD-10-CM | POA: Diagnosis not present

## 2017-05-30 DIAGNOSIS — H43391 Other vitreous opacities, right eye: Secondary | ICD-10-CM | POA: Diagnosis not present

## 2017-05-30 DIAGNOSIS — H35431 Paving stone degeneration of retina, right eye: Secondary | ICD-10-CM | POA: Diagnosis not present

## 2017-05-30 DIAGNOSIS — H35371 Puckering of macula, right eye: Secondary | ICD-10-CM | POA: Diagnosis not present

## 2017-06-08 ENCOUNTER — Other Ambulatory Visit: Payer: Self-pay

## 2017-06-08 ENCOUNTER — Encounter: Payer: Self-pay | Admitting: Emergency Medicine

## 2017-06-08 ENCOUNTER — Emergency Department (INDEPENDENT_AMBULATORY_CARE_PROVIDER_SITE_OTHER)
Admission: EM | Admit: 2017-06-08 | Discharge: 2017-06-08 | Disposition: A | Discharge status: Home / Self Care | Payer: Medicare Other | Source: Home / Self Care

## 2017-06-08 DIAGNOSIS — N3000 Acute cystitis without hematuria: Secondary | ICD-10-CM

## 2017-06-08 LAB — POCT URINALYSIS DIP (MANUAL ENTRY)
Bilirubin, UA: NEGATIVE
Blood, UA: NEGATIVE
Glucose, UA: NEGATIVE mg/dL
Ketones, POC UA: NEGATIVE mg/dL
Nitrite, UA: NEGATIVE
Protein Ur, POC: NEGATIVE mg/dL
Spec Grav, UA: 1.015 (ref 1.010–1.025)
Urobilinogen, UA: 0.2 E.U./dL
pH, UA: 5.5 (ref 5.0–8.0)

## 2017-06-08 MED ORDER — CEPHALEXIN 250 MG PO CAPS: 250.0000 mg | ORAL_CAPSULE | Freq: Three times a day (TID) | ORAL | 0 refills | Status: DC

## 2017-06-08 NOTE — ED Provider Notes (Signed)
[name]    CSN: [id number] Arrival date & time: 06/08/17  1615     History   Chief Complaint Chief Complaint  Patient presents with  . Dysuria  . Abdominal Pain  . Nasal Congestion    HPI Traci [name] is a 71 y.o. female.   HPI This is a 71 y.o. female who presents today with UTI symptoms for 2 days.  + dysuria + frequency + urgency No hematuria No vaginal discharge No fever/chills Mild suprapubic pain, but no other abdominal pain Had minimal nausea, that's resolved. Currently, tolerating by mouth's well. No vomiting No back pain Mild fatigue, nonspecific, but no syncope or focal neurologic symptoms. Has had vague, nonfocal chest congestion, minimal nasal congestion without discharge, but no exertional chest pain or dyspnea. No ear or throat symptoms. Denies any new myalgias, arthralgias, new rash, or any recent tick bite Has tried over-the-counter measures without improvement.   Gastroenterologist is Dr. Glee Arvin, last saw 01/2017 Ongoing Diagnosis: Irritable bowel syndrome with constipation   Past Medical History:  Diagnosis Date  . Anxiety   . Atrial fibrillation ([place])   . CAD (coronary artery disease)   . Claudication ([place]) 01/06/2008   Lower extremity dopplers - no evidence of arterial insufficiency, normal exam  . Coronary artery disease   . GERD (gastroesophageal reflux disease)   . Hyperlipidemia   . Hypertension 11/30/2010   echo- EF 55%; normal w/ mildly sclerotic aortic valve  . Hypertension 11/05/11   renal dopplers - celiac artery and SMA >50% diameter reduction, R renal artery - mildly elevated velocities 1-59% diameter reduction, L renal artery normal  . Nonspecific ST-T wave electrocardiographic changes 03/26/2011   R/Lmv - EF 74%, normal perfusion all regions, ST depression w/ Lexiscan infusion w/o assoc angina  . Osteopenia   . PAD (peripheral artery disease) ([place])   . Peripheral vascular disease ([place])    . PVD (peripheral vascular disease) ([place]) 11/05/2011   doppler - R/L brachial pressures essentially equal w/o inflow disease; L sublclavian/CCA bypass graft demonstrates patent flow, no evidence of significant stenosis  . Sigmoid diverticulitis     Patient Active Problem List   Diagnosis Date Noted  . Strain of right Achilles tendon 04/14/2017  . Protein-calorie malnutrition ([place]) 08/29/2016  . Atypical nevus 08/22/2016  . Acute bilateral low back pain with left-sided sciatica 06/06/2016  . Osteopenia 12/13/2015  . Menopause 12/08/2015  . History of colonoscopy 07/17/2015  . Irregular heartbeat 07/26/2014  . 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 Preoperative evaluation of a medical condition to rule out surgical contraindications (TAR required) 01/17/2014  . 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[place]) 01/16/2014  . Hyperlipidemia 04/05/2013  . Pre-syncope 12/06/2012  . Hypertension 12/06/2012  . Peripheral arterial disease ([place]) 12/06/2012  . Coronary artery disease, non-occlusive, last cath 2009 12/06/2012  . Hyponatremia, improved holding diuretic 12/06/2012  . Atherosclerosis of other specified arteries 04/02/2012  . 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 Stricture of artery ([place]) 10/02/2011  . Subclavian artery stenosis, left ([place]) 10/02/2011    Past Surgical History:  Procedure Laterality Date  . ABDOMINAL AORTIC ANEURYSM REPAIR N/A 01/27/2014   Procedure: AORTO-SUPERIOR MESENTERIC ARTERY BYPASS GRAFT;  Surgeon: Angelia Mould, MD;  Location: [facility];  Service: Vascular;  Laterality: N/A;  . ABDOMINAL HYSTERECTOMY    . APPENDECTOMY    . CARDIAC CATHETERIZATION  06/03/2008   60% LAD, involving D2, borderline significant by IVUS, medical therapy. CFX, RCA OK.  . CHOLECYSTECTOMY    .  EYE SURGERY     retinal surgery  . SPINE SURGERY  Feb. 2011  . [place]   X's  several  . SUBCLAVIAN ARTERY STENT Left 09/20/2008   LSA ISR 7x38mm Cordis Genesis on Opta  premount, reduction from 80% to 0%  . subclavian artery stents  01/23/2010   Left carotid to subclavian artery Bypass  . VISCERAL ANGIOGRAM  01/17/2014   Procedure: VISCERAL ANGIOGRAM;  Surgeon: Angelia Mould, MD;  Location: [facility] CATH LAB;  Service: Cardiovascular;;    OB History    No data available       Home Medications    Prior to Admission medications   Medication Sig Start Date End Date Taking? Authorizing Provider  aspirin EC 81 MG tablet Take 81 mg by mouth daily.    [provider]  atenolol (TENORMIN) 25 MG tablet Take 1 tablet (25 mg total) by mouth daily. 04/04/17   Croitoru, Mihai, MD  atorvastatin (LIPITOR) 10 MG tablet Take 1 tablet (10 mg total) daily at 6 PM by mouth. 04/28/17   Croitoru, Dani Gobble, MD  candesartan (ATACAND) 16 MG tablet Take 0.5 tablets (8 mg total) daily by mouth. 04/28/17   Croitoru, Mihai, MD  cephALEXin (KEFLEX) 250 MG capsule Take 1 capsule (250 mg total) by mouth 3 (three) times daily. For 7 days 06/08/17   Jacqulyn Cane, MD  cholecalciferol (VITAMIN D) 1000 UNITS tablet Take 1,000 Units by mouth daily.    [provider]  clopidogrel (PLAVIX) 75 MG tablet Take 1 tablet (75 mg total) daily by mouth. 04/28/17   Croitoru, Mihai, MD  clorazepate (TRANXENE) 7.5 MG tablet Take 1 tablet (7.5 mg total) by mouth daily as needed. 05/07/17   Gregor Hams, MD  cyanocobalamin 1000 MCG tablet Take 100 mcg by mouth daily.    [provider]  EPINEPHrine (EPIPEN 2-PAK) 0.3 mg/0.3 mL IJ SOAJ injection Inject 0.3 mLs (0.3 mg total) into the muscle as needed (for allergic reaction). 10/02/15   Marcial Pacas, DO  esomeprazole (NEXIUM) 40 MG capsule Take 1 capsule (40 mg total) by mouth daily. Take 1 tab daily Patient taking differently: Take 40 mg by mouth daily.  02/23/15   Croitoru, Mihai, MD  estradiol (ESTRACE) 0.5 MG tablet TAKE 1 TABLET DAILY 12/23/16   Gregor Hams, MD  furosemide (LASIX) 20 MG tablet TAKE 1 TABLET EVERY OTHER DAY  03/31/17   Croitoru, Mihai, MD  ketoconazole (NIZORAL) 2 % shampoo Apply once daily and wash off for one week. 12/16/16   Kandra Nicolas, MD  lidocaine (LIDODERM) 5 % Place 1 patch onto the skin daily. Remove & Discard patch within 12 hours or as directed by MD 10/08/16   Tanna Furry, MD  linaclotide [facility]) 145 MCG CAPS capsule Take 1 capsule by mouth daily as needed. 12/22/15   [provider]  meclizine (ANTIVERT) 25 MG tablet TAKE ONE-HALF (1/2) TABLET (12.5 MG) THREE TIMES A DAY AS NEEDED FOR DIZZINESS OR NAUSEA 01/29/17   Gregor Hams, MD  Multiple Vitamin (MULTIVITAMIN WITH MINERALS) TABS Take 1 tablet by mouth daily.    [provider]  polyethylene glycol (MIRALAX / GLYCOLAX) packet Take 17 g by mouth 2 (two) times daily. Until stooling regularly Patient taking differently: Take 17 g by mouth as needed. Until stooling regularly 11/09/14   Silverio Decamp, MD  zolpidem (AMBIEN) 10 MG tablet Take 1 tablet (10 mg total) by mouth at bedtime as needed for sleep.  05/01/16   Gregor Hams, MD    Family History Family History  Problem Relation Age of Onset  . Heart disease Father 71       Heart Disease before age 86  . Kidney disease Father   . Heart attack Father   . Hyperlipidemia Father   . Hypertension Father   . Kidney failure Mother   . Diabetes Maternal Grandmother   . Heart disease Paternal Grandfather     Social History Social History   Tobacco Use  . Smoking status: Former Smoker    Packs/day: 0.25    Years: 20.00    Pack years: 5.00    Types: Cigarettes    Last attempt to quit: 09/30/1993    Years since quitting: 23.7  . Smokeless tobacco: Never Used  Substance Use Topics  . Alcohol use: No  . Drug use: No     Allergies   Cortisone; Dilaudid [hydromorphone hcl]; Iodine; Medrol [methylprednisolone]; Omnipaque [iohexol]; Prednisone; Shellfish allergy; Sulfa drugs cross reactors; Augmentin [amoxicillin-pot clavulanate]; Codeine; Erythromycin;  and Morphine and related   Review of Systems Review of Systems  All other systems reviewed and are negative.    Physical Exam Triage Vital Signs ED Triage Vitals [06/08/17 1702]  Enc Vitals Group     BP (!) 142/84     Pulse Rate (!) 58     Resp 16     Temp 98.2 F (36.8 C)     Temp Source Oral     SpO2 97 %     Weight 150 lb (68 kg)     Height      Head Circumference      Peak Flow      Pain Score 7     Pain Loc      Pain Edu?      Excl. in [place]?    No data found.  Updated Vital Signs BP (!) 142/84 (BP Location: Left Arm)   Pulse (!) 58   Temp 98.2 F (36.8 C) (Oral)   Resp 16   Wt 150 lb (68 kg)   SpO2 97%   BMI 25.75 kg/m   Visual Acuity Right Eye Distance:   Left Eye Distance:   Bilateral Distance:    Right Eye Near:   Left Eye Near:    Bilateral Near:     Physical Exam  Constitutional: Traci [name] is oriented to person, place, and time. Traci [name] appears well-developed and well-nourished. No distress.  HENT:  Mouth/Throat: Oropharynx is clear and moist.  Eyes: No scleral icterus.  Neck: Neck supple.  Cardiovascular: Normal rate, regular rhythm and normal heart sounds.  Pulmonary/Chest: Breath sounds normal.  Abdominal: Soft. Traci [name] exhibits no mass. There is no hepatosplenomegaly. There is tenderness in the suprapubic area. There is no rebound, no guarding and no CVA tenderness.  Lymphadenopathy:    Traci [name] has no cervical adenopathy.  Neurological: Traci [name] is alert and oriented to person, place, and time.  Skin: Skin is warm and dry.  Nursing note and vitals reviewed.    UC Treatments / Results  Labs (all labs ordered are listed, but only abnormal results are displayed) Labs Reviewed  POCT URINALYSIS DIP (MANUAL ENTRY) - Abnormal; Notable for the following components:      Result Value   Leukocytes, UA Trace (*)    All other components within normal limits  URINE CULTURE    EKG  EKG Interpretation None       Radiology No results  found.  Procedures  Procedures (including critical care time)  Medications Ordered in UC Medications - No data to display   Initial Impression / Assessment and Plan / UC Course  I have reviewed the triage vital signs and the nursing notes.  Pertinent labs & imaging results that were available during my care of the patient were reviewed by me and considered in my medical decision making (see chart for details).       Final Clinical Impressions(s) / UC Diagnoses   Final diagnoses:  Acute cystitis without hematuria  Clinically has uncomplicated acute cystitis. Abdominal exam is benign and physical exam shows no acute abnormalities.  ED Discharge Orders        Ordered    cephALEXin (KEFLEX) 250 MG capsule  3 times daily     06/08/17 1726     Risks, benefits, alternatives discussed Urine culture sent. Cephalexin prescribed . Discussed, Traci [name] states Traci [name]'s taken cephalexin in the past without any problems. Not currently having any nausea and Traci [name] is tolerating by mouth's well. Traci [name] declined any antinausea Rx. Follow-up with PCP in 7 days, sooner if worse. If any severe worsening symptoms, go to emergency room immediately. Precautions discussed. Red flags discussed. An After Visit Summary was printed and given to the patient. Questions invited and answered. Patient voiced understanding and agreement.     Jacqulyn Cane, MD 06/08/17 [phone]

## 2017-06-08 NOTE — ED Triage Notes (Signed)
Patient presents to [facility] with complaint of Dysuria, Abdominal Pain & Chest Congestion

## 2017-06-09 LAB — URINE CULTURE
MICRO NUMBER:: 81445037
Result:: NO GROWTH
SPECIMEN QUALITY:: ADEQUATE

## 2017-06-11 ENCOUNTER — Telehealth: Payer: Self-pay | Admitting: Emergency Medicine

## 2017-06-21 ENCOUNTER — Other Ambulatory Visit: Payer: Self-pay | Admitting: Family Medicine

## 2017-07-02 ENCOUNTER — Other Ambulatory Visit: Payer: Self-pay

## 2017-07-02 NOTE — Telephone Encounter (Signed)
Patient request refill for Ambien 10 mg sent to [facility]. Please advise. Rhonda Cunningham,CMA

## 2017-07-03 MED ORDER — ZOLPIDEM TARTRATE 10 MG PO TABS: 10.0000 mg | ORAL_TABLET | Freq: Every evening | ORAL | 0 refills | Status: DC | PRN

## 2017-07-10 ENCOUNTER — Encounter: Payer: Self-pay | Admitting: Sports Medicine

## 2017-07-10 ENCOUNTER — Ambulatory Visit (INDEPENDENT_AMBULATORY_CARE_PROVIDER_SITE_OTHER): Payer: Medicare Other | Admitting: Sports Medicine

## 2017-07-10 DIAGNOSIS — M1812 Unilateral primary osteoarthritis of first carpometacarpal joint, left hand: Secondary | ICD-10-CM

## 2017-07-10 MED ORDER — MELOXICAM 15 MG PO TABS: 15.0000 mg | ORAL_TABLET | Freq: Every day | ORAL | 0 refills | Status: DC

## 2017-07-10 NOTE — Progress Notes (Signed)
Subjective:    I'm seeing this patient as a consultation for: Dr. Lynne Leader  CC: wrist pain  HPI: Pt is a 72 year old female presenting with progressive pain in the left wrist. She reports her pain has been exacerbated recently but denies recent or past injuries. She reports there is pain when she touches the area of the distal radius with radiation to the first and second phalanges. She reports pain with extension of the thumb and daily tasks like getting food from the fridge. Denies numbness and tingling.  Pain is moderate, persistent, localized without radiation past the mid forearm.  I reviewed the past medical history, family history, social history, surgical history, and allergies today and no changes were needed.  Please see the problem list section below in epic for further details.  Past Medical History: Past Medical History:  Diagnosis Date  . Anxiety   . Atrial fibrillation ([place])   . CAD (coronary artery disease)   . Claudication ([place]) 01/06/2008   Lower extremity dopplers - no evidence of arterial insufficiency, normal exam  . Coronary artery disease   . GERD (gastroesophageal reflux disease)   . Hyperlipidemia   . Hypertension 11/30/2010   echo- EF 55%; normal w/ mildly sclerotic aortic valve  . Hypertension 11/05/11   renal dopplers - celiac artery and SMA >50% diameter reduction, R renal artery - mildly elevated velocities 1-59% diameter reduction, L renal artery normal  . Nonspecific ST-T wave electrocardiographic changes 03/26/2011   R/Lmv - EF 74%, normal perfusion all regions, ST depression w/ Lexiscan infusion w/o assoc angina  . Osteopenia   . PAD (peripheral artery disease) ([place])   . Peripheral vascular disease ([place])   . PVD (peripheral vascular disease) ([place]) 11/05/2011   doppler - R/L brachial pressures essentially equal w/o inflow disease; L sublclavian/CCA bypass graft demonstrates patent flow, no evidence of significant stenosis  . Sigmoid diverticulitis     Past Surgical History: Past Surgical History:  Procedure Laterality Date  . ABDOMINAL AORTIC ANEURYSM REPAIR N/A 01/27/2014   Procedure: AORTO-SUPERIOR MESENTERIC ARTERY BYPASS GRAFT;  Surgeon: Angelia Mould, MD;  Location: [facility];  Service: Vascular;  Laterality: N/A;  . ABDOMINAL HYSTERECTOMY    . APPENDECTOMY    . CARDIAC CATHETERIZATION  06/03/2008   60% LAD, involving D2, borderline significant by IVUS, medical therapy. CFX, RCA OK.  . CHOLECYSTECTOMY    . EYE SURGERY     retinal surgery  . SPINE SURGERY  Feb. 2011  . [place]   X's  several  . SUBCLAVIAN ARTERY STENT Left 09/20/2008   LSA ISR 7x80mm Cordis Genesis on Opta premount, reduction from 80% to 0%  . subclavian artery stents  01/23/2010   Left carotid to subclavian artery Bypass  . VISCERAL ANGIOGRAM  01/17/2014   Procedure: VISCERAL ANGIOGRAM;  Surgeon: Angelia Mould, MD;  Location[facility] CATH LAB;  Service: Cardiovascular;;   Social History: Social History   Socioeconomic History  . Marital status: Legally Separated    Spouse name: None  . Number of children: None  . Years of education: None  . Highest education level: None  Social Needs  . Financial resource strain: None  . Food insecurity - worry: None  . Food insecurity - inability: None  . Transportation needs - medical: None  . Transportation needs - non-medical: None  Occupational History  . None  Tobacco Use  . Smoking status: Former Smoker    Packs/day: 0.25  Years: 20.00    Pack years: 5.00    Types: Cigarettes    Last attempt to quit: 09/30/1993    Years since quitting: 23.7  . Smokeless tobacco: Never Used  Substance and Sexual Activity  . Alcohol use: No  . Drug use: No  . Sexual activity: No  Other Topics Concern  . None  Social History Narrative  . None   Family History: Family History  Problem Relation Age of Onset  . Heart disease Father 40       Heart Disease before age 37  . Kidney disease  Father   . Heart attack Father   . Hyperlipidemia Father   . Hypertension Father   . Kidney failure Mother   . Diabetes Maternal Grandmother   . Heart disease Paternal Grandfather    Allergies: Allergies  Allergen Reactions  . Cortisone Anaphylaxis  . Dilaudid [Hydromorphone Hcl] Nausea And Vomiting    Pt states she will start vomiting immediately for 6 hours  . Iodine Anaphylaxis  . Medrol [Methylprednisolone] Anaphylaxis  . Omnipaque [Iohexol] Anaphylaxis  . Prednisone Anaphylaxis  . Shellfish Allergy Anaphylaxis  . Sulfa Drugs Cross Reactors Anaphylaxis  . Augmentin [Amoxicillin-Pot Clavulanate] Nausea Only    Upset stomach (Has taken cephalexin in the past without problems)  . Codeine Rash  . Erythromycin Rash  . Morphine And Related Nausea Only    nausea   Medications: See med rec.  Review of Systems: No headache, visual changes, nausea, vomiting, diarrhea, constipation, dizziness, abdominal pain, skin rash, fevers, chills, night sweats, weight loss, swollen lymph nodes, body aches, joint swelling, muscle aches, chest pain, shortness of breath, mood changes, visual or auditory hallucinations.   Objective:   General: Well Developed, well nourished, and in no acute distress.  Neuro:  Extra-ocular muscles intact, able to move all 4 extremities, sensation grossly intact.  Deep tendon reflexes tested were normal. Psych: Alert and oriented, mood congruent with affect. ENT:  Ears and nose appear unremarkable.  Hearing grossly normal. Neck: Unremarkable overall appearance, trachea midline.  No visible thyroid enlargement. Eyes: Conjunctivae and lids appear unremarkable.  Pupils equal and round. Skin: Warm and dry, no rashes noted.  Cardiovascular: Pulses palpable, no extremity edema. Left wrist: point tenderness in the first [facility] articulation with the trapezium. Full ROM with Pain in all directions of the first phalanx.  Negative Finkelstein test.  Negative Spurling test.  Negative Tinels test of the median nerve.   Impression and Recommendations:   This case required medical decision making of moderate complexity.  Primary osteoarthritis of first carpometacarpal joint of left hand Thumb spica brace, meloxicam, x-rays. Return in 2 weeks, injection if no better.  ___________________________________________ Gwen Her. Dianah Field, M.D., ABFM., CAQSM. Primary Care and [place] Instructor of [place] of [facility] of Medicine

## 2017-07-10 NOTE — Assessment & Plan Note (Signed)
Thumb spica brace, meloxicam, x-rays. Return in 2 weeks, injection if no better.

## 2017-07-10 NOTE — Progress Notes (Signed)
I have seen and examined the below patient, I agree with the med student's findings, assessment, and plan. ___________________________________________ Gwen Her. Dianah Field, M.D., ABFM., CAQSM. Primary Care and [place] Instructor of [place] of [facility] of Medicine

## 2017-07-19 ENCOUNTER — Encounter: Payer: Self-pay | Admitting: Emergency Medicine

## 2017-07-19 ENCOUNTER — Other Ambulatory Visit: Payer: Self-pay

## 2017-07-19 ENCOUNTER — Emergency Department (INDEPENDENT_AMBULATORY_CARE_PROVIDER_SITE_OTHER)
Admission: EM | Admit: 2017-07-19 | Discharge: 2017-07-19 | Disposition: A | Discharge status: Home / Self Care | Payer: Medicare Other | Source: Home / Self Care

## 2017-07-19 DIAGNOSIS — Z0189 Encounter for other specified special examinations: Secondary | ICD-10-CM | POA: Diagnosis not present

## 2017-07-19 DIAGNOSIS — T783XXA Angioneurotic edema, initial encounter: Secondary | ICD-10-CM

## 2017-07-19 DIAGNOSIS — R21 Rash and other nonspecific skin eruption: Secondary | ICD-10-CM | POA: Diagnosis not present

## 2017-07-19 DIAGNOSIS — Z5321 Procedure and treatment not carried out due to patient leaving prior to being seen by health care provider: Secondary | ICD-10-CM | POA: Diagnosis not present

## 2017-07-19 LAB — POCT URINALYSIS DIP (MANUAL ENTRY)
Bilirubin, UA: NEGATIVE
Glucose, UA: NEGATIVE mg/dL
Ketones, POC UA: NEGATIVE mg/dL
NITRITE UA: NEGATIVE
PH UA: 6 (ref 5.0–8.0)
Spec Grav, UA: 1.02 (ref 1.010–1.025)
Urobilinogen, UA: 0.2 E.U./dL

## 2017-07-19 LAB — POCT CBC W AUTO DIFF (K'VILLE URGENT CARE)

## 2017-07-19 LAB — POCT RAPID STREP A (OFFICE): Rapid Strep A Screen: NEGATIVE

## 2017-07-19 MED ORDER — FAMOTIDINE 20 MG PO TABS: 20.0000 mg | ORAL_TABLET | Freq: Once | ORAL | Status: DC

## 2017-07-19 NOTE — Discharge Instructions (Signed)
Continue Benadryl 25mg , 2 caps every 4 to 6 hours. Begin taking Pepcid 20mg , one tab twice daily. If symptoms become significantly worse during the night or over the weekend, proceed to the local emergency room.

## 2017-07-19 NOTE — ED Provider Notes (Signed)
[name]    CSN: [id number] Arrival date & time: 07/19/17  0913     History   Chief Complaint Chief Complaint  Patient presents with  . Rash  . Shortness of Breath    HPI Traci [name] is a 72 y.o. female.   Two days ago patient developed itching on her arms that has persisted.  Yesterday she developed sore throat, and last night developed swelling of her lips.  She also had myalgias and chills/sweats last night.  She experienced a burning sensation in her esophagus when she swallowed.  She had wheezing and shortness of breath this morning.  Her wheezing and lip swelling have resolved after taking Benadryl.   The history is provided by the patient.    Past Medical History:  Diagnosis Date  . Anxiety   . Atrial fibrillation ([place])   . CAD (coronary artery disease)   . Claudication ([place]) 01/06/2008   Lower extremity dopplers - no evidence of arterial insufficiency, normal exam  . Coronary artery disease   . GERD (gastroesophageal reflux disease)   . Hyperlipidemia   . Hypertension 11/30/2010   echo- EF 55%; normal w/ mildly sclerotic aortic valve  . Hypertension 11/05/11   renal dopplers - celiac artery and SMA >50% diameter reduction, R renal artery - mildly elevated velocities 1-59% diameter reduction, L renal artery normal  . Nonspecific ST-T wave electrocardiographic changes 03/26/2011   R/Lmv - EF 74%, normal perfusion all regions, ST depression w/ Lexiscan infusion w/o assoc angina  . Osteopenia   . PAD (peripheral artery disease) ([place])   . Peripheral vascular disease ([place])   . PVD (peripheral vascular disease) ([place]) 11/05/2011   doppler - R/L brachial pressures essentially equal w/o inflow disease; L sublclavian/CCA bypass graft demonstrates patent flow, no evidence of significant stenosis  . Sigmoid diverticulitis     Patient Active Problem List   Diagnosis Date Noted  . Primary osteoarthritis of first carpometacarpal joint of left hand  07/10/2017  . Strain of right Achilles tendon 04/14/2017  . Protein-calorie malnutrition ([place]) 08/29/2016  . Atypical nevus 08/22/2016  . Acute bilateral low back pain with left-sided sciatica 06/06/2016  . Osteopenia 12/13/2015  . Menopause 12/08/2015  . History of colonoscopy 07/17/2015  . Irregular heartbeat 07/26/2014  . 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 Preoperative evaluation of a medical condition to rule out surgical contraindications (TAR required) 01/17/2014  . 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[place]) 01/16/2014  . Hyperlipidemia 04/05/2013  . Pre-syncope 12/06/2012  . Hypertension 12/06/2012  . Peripheral arterial disease ([place]) 12/06/2012  . Coronary artery disease, non-occlusive, last cath 2009 12/06/2012  . Hyponatremia, improved holding diuretic 12/06/2012  . Atherosclerosis of other specified arteries 04/02/2012  . 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 Stricture of artery ([place]) 10/02/2011  . Subclavian artery stenosis, left ([place]) 10/02/2011    Past Surgical History:  Procedure Laterality Date  . ABDOMINAL AORTIC ANEURYSM REPAIR N/A 01/27/2014   Procedure: AORTO-SUPERIOR MESENTERIC ARTERY BYPASS GRAFT;  Surgeon: Angelia Mould, MD;  Location: [facility];  Service: Vascular;  Laterality: N/A;  . ABDOMINAL HYSTERECTOMY    . APPENDECTOMY    . CARDIAC CATHETERIZATION  06/03/2008   60% LAD, involving D2, borderline significant by IVUS, medical therapy. CFX, RCA OK.  . CHOLECYSTECTOMY    . EYE SURGERY     retinal surgery  . SPINE SURGERY  Feb. 2011  . [place]   X's  several  . SUBCLAVIAN ARTERY STENT Left  09/20/2008   LSA ISR 7x52mm Cordis Genesis on Opta premount, reduction from 80% to 0%  . subclavian artery stents  01/23/2010   Left carotid to subclavian artery Bypass  . VISCERAL ANGIOGRAM  01/17/2014   Procedure: VISCERAL ANGIOGRAM;  Surgeon: Angelia Mould, MD;  Location: [facility] C[facility] LAB;  Service: Cardiovascular;;    OB History    No  data available       Home Medications    Prior to Admission medications   Medication Sig Start Date End Date Taking? Authorizing Provider  aspirin EC 81 MG tablet Take 81 mg by mouth daily.    [provider]  atenolol (TENORMIN) 25 MG tablet Take 1 tablet (25 mg total) by mouth daily. 04/04/17   Croitoru, Mihai, MD  atorvastatin (LIPITOR) 10 MG tablet Take 1 tablet (10 mg total) daily at 6 PM by mouth. 04/28/17   Croitoru, Dani Gobble, MD  candesartan (ATACAND) 16 MG tablet Take 0.5 tablets (8 mg total) daily by mouth. 04/28/17   Croitoru, Mihai, MD  cholecalciferol (VITAMIN D) 1000 UNITS tablet Take 1,000 Units by mouth daily.    [provider]  clopidogrel (PLAVIX) 75 MG tablet Take 1 tablet (75 mg total) daily by mouth. 04/28/17   Croitoru, Mihai, MD  clorazepate (TRANXENE) 7.5 MG tablet Take 1 tablet (7.5 mg total) by mouth daily as needed. 05/07/17   Gregor Hams, MD  cyanocobalamin 1000 MCG tablet Take 100 mcg by mouth daily.    [provider]  EPINEPHrine (EPIPEN 2-PAK) 0.3 mg/0.3 mL IJ SOAJ injection Inject 0.3 mLs (0.3 mg total) into the muscle as needed (for allergic reaction). 10/02/15   Marcial Pacas, DO  esomeprazole (NEXIUM) 40 MG capsule Take 1 capsule (40 mg total) by mouth daily. Take 1 tab daily Patient taking differently: Take 40 mg by mouth daily.  02/23/15   Croitoru, Mihai, MD  estradiol (ESTRACE) 0.5 MG tablet TAKE 1 TABLET DAILY 06/23/17   Gregor Hams, MD  furosemide (LASIX) 20 MG tablet TAKE 1 TABLET EVERY OTHER DAY 03/31/17   Croitoru, Mihai, MD  linaclotide (LINZESS) 145 MCG CAPS capsule Take 1 capsule by mouth daily as needed. 12/22/15   [provider]  meclizine (ANTIVERT) 25 MG tablet TAKE ONE-HALF (1/2) TABLET (12.5 MG) THREE TIMES A DAY AS NEEDED FOR DIZZINESS OR NAUSEA 01/29/17   Gregor Hams, MD  meloxicam (MOBIC) 15 MG tablet Take 1 tablet (15 mg total) by mouth daily. 07/10/17   Silverio Decamp, MD  Multiple Vitamin  (MULTIVITAMIN WITH MINERALS) TABS Take 1 tablet by mouth daily.    [provider]  polyethylene glycol (MIRALAX / GLYCOLAX) packet Take 17 g by mouth 2 (two) times daily. Until stooling regularly Patient taking differently: Take 17 g by mouth as needed. Until stooling regularly 11/09/14   Silverio Decamp, MD  zolpidem (AMBIEN) 10 MG tablet Take 1 tablet (10 mg total) by mouth at bedtime as needed for sleep. 07/03/17   Gregor Hams, MD    Family History Family History  Problem Relation Age of Onset  . Heart disease Father 19       Heart Disease before age 59  . Kidney disease Father   . Heart attack Father   . Hyperlipidemia Father   . Hypertension Father   . Kidney failure Mother   . Diabetes Maternal Grandmother   . Heart disease Paternal Grandfather     Social History Social History   Tobacco Use  .  Smoking status: Former Smoker    Packs/day: 0.25    Years: 20.00    Pack years: 5.00    Types: Cigarettes    Last attempt to quit: 09/30/1993    Years since quitting: 23.8  . Smokeless tobacco: Never Used  Substance Use Topics  . Alcohol use: No  . Drug use: No     Allergies   Cortisone; Dilaudid [hydromorphone hcl]; Iodine; Medrol [methylprednisolone]; Omnipaque [iohexol]; Prednisone; Shellfish allergy; Sulfa drugs cross reactors; Augmentin [amoxicillin-pot clavulanate]; Codeine; Erythromycin; and Morphine and related   Review of Systems Review of Systems + sore throat + swollen lips No cough No pleuritic pain + wheezing No nasal congestion ? post-nasal drainage No sinus pain/pressure No itchy/red eyes No earache No hemoptysis No SOB No fever, + chills/sweats No nausea No vomiting No abdominal pain No diarrhea No urinary symptoms No skin rash + fatigue + myalgias + headache Used OTC meds without relief   Physical Exam Triage Vital Signs ED Triage Vitals  Enc Vitals Group     BP 07/19/17 0921 122/76     Pulse Rate 07/19/17 0921 78       Resp 07/19/17 0921 18     Temp 07/19/17 0921 98.4 F (36.9 C)     Temp Source 07/19/17 0921 Oral     SpO2 07/19/17 0921 99 %     Weight --      Height --      Head Circumference --      Peak Flow --      Pain Score 07/19/17 0922 0     Pain Loc --      Pain Edu? --      Excl. in [place]? --    No data found.  Updated Vital Signs BP 122/76 (BP Location: Right Arm)   Pulse 78   Temp 98.4 F (36.9 C) (Oral)   Resp 18   SpO2 99%   Visual Acuity Right Eye Distance:   Left Eye Distance:   Bilateral Distance:    Right Eye Near:   Left Eye Near:    Bilateral Near:     Physical Exam Nursing notes and Vital Signs reviewed. Appearance:  Patient appears stated age, and in no acute distress Eyes:  Pupils are equal, round, and reactive to light and accomodation.  Extraocular movement is intact.  Conjunctivae are not inflamed  Ears:  Canals normal.  Tympanic membranes normal.  Nose:  Mildly congested turbinates.  No sinus tenderness. Mouth:  No swelling of lips noted. Pharynx:  Normal  Neck:  Supple.  Tender tonsillar nodes.  Enlarged posterior/lateral nodes are palpated bilaterally, tender to palpation on the left.   Lungs:  Clear to auscultation.  Breath sounds are equal.  Moving air well. Heart:  Regular rate and rhythm without murmurs, rubs, or gallops.  Abdomen:  Nontender without masses or hepatosplenomegaly.  Bowel sounds are present.  No CVA or flank tenderness.  Extremities:  No edema.  Skin:   Faintly erythematous punctate lesions on upper extremities.    UC Treatments / Results  Labs (all labs ordered are listed, but only abnormal results are displayed) Labs Reviewed  POCT URINALYSIS DIP (MANUAL ENTRY) - Abnormal; Notable for the following components:      Result Value   Blood, UA trace-intact (*)    Protein Ur, POC trace (*)    Leukocytes, UA Small (1+) (*)    All other components within normal limits  STREP A DNA PROBE  POCT CBC  W AUTO DIFF ([facility]):  WBC 4.7; LY 35.5; MO 5.2; GR 59.3; Hgb 13.3; Platelets 314   POCT RAPID STREP A (OFFICE) negative    EKG  EKG Interpretation None       Radiology No results found.  Procedures Procedures (including critical care time)  Medications Ordered in UC Medications - No data to display   Initial Impression / Assessment and Plan / UC Course  I have reviewed the triage vital signs and the nursing notes.  Pertinent labs & imaging results that were available during my care of the patient were reviewed by me and considered in my medical decision making (see chart for details).    Suspect allergic reaction to unknown agent, possibly developing viral syndrome.  Her symptoms appear to be resolving with Benadryl.  She is unable to take corticosteroids. Administered Pepcid 20mg .    Continue Benadryl 25mg , 2 caps every 4 to 6 hours. Begin taking Pepcid 20mg , one tab twice daily. If symptoms become significantly worse during the night or over the weekend, proceed to the local emergency room.  Followup with dermatologist if not improving.   Final Clinical Impressions(s) / UC Diagnoses   Final diagnoses:  Rash and nonspecific skin eruption  Angioedema, initial encounter    ED Discharge Orders    None          Kandra Nicolas, MD 07/22/17 1453

## 2017-07-19 NOTE — ED Notes (Signed)
10:45 Patient given pepsid 20mg  per verbal order Dr.Beese. pk

## 2017-07-19 NOTE — ED Triage Notes (Signed)
Patient reports a progressive rash starting 2 days ago; severe itching; now feels internal raw/scratchiness along esophagus; has been taking benadryl q 3-4 hours since yesterday with last dose 0500. Ambulatory and steady; drove herself.

## 2017-07-20 ENCOUNTER — Encounter (HOSPITAL_BASED_OUTPATIENT_CLINIC_OR_DEPARTMENT_OTHER): Payer: Self-pay | Admitting: Emergency Medicine

## 2017-07-20 ENCOUNTER — Other Ambulatory Visit: Payer: Self-pay

## 2017-07-20 ENCOUNTER — Emergency Department (HOSPITAL_BASED_OUTPATIENT_CLINIC_OR_DEPARTMENT_OTHER)
Admission: EM | Admit: 2017-07-20 | Discharge: 2017-07-20 | Disposition: A | Discharge status: Home / Self Care | Payer: Medicare Other | Attending: Emergency Medicine | Admitting: Emergency Medicine

## 2017-07-20 DIAGNOSIS — R21 Rash and other nonspecific skin eruption: Secondary | ICD-10-CM | POA: Diagnosis not present

## 2017-07-20 DIAGNOSIS — R197 Diarrhea, unspecified: Secondary | ICD-10-CM | POA: Insufficient documentation

## 2017-07-20 DIAGNOSIS — Z5321 Procedure and treatment not carried out due to patient leaving prior to being seen by health care provider: Secondary | ICD-10-CM | POA: Diagnosis not present

## 2017-07-20 DIAGNOSIS — R109 Unspecified abdominal pain: Secondary | ICD-10-CM | POA: Diagnosis not present

## 2017-07-20 NOTE — ED Provider Notes (Signed)
Patient checked in to the emergency department with concern for loose stool diarrhea. Vital signs normal.  Signed up to see patient, however she appeared to have left less than an hour after checking in without being seen.   Gareth Morgan, MD 07/20/17 2205

## 2017-07-20 NOTE — ED Notes (Signed)
Went in to round on the patient and the bed was clean and made

## 2017-07-20 NOTE — ED Notes (Signed)
Patient no longer in the department

## 2017-07-20 NOTE — ED Triage Notes (Addendum)
Patient states that she has had a rash to her full body for the last 3 days - Patient went to urgent care and has been taking Benadryl  - patient states that she is now having abdominal cramping intermittently, that radiates to flank pain and loose stools

## 2017-07-21 ENCOUNTER — Encounter: Payer: Self-pay | Admitting: Family Medicine

## 2017-07-21 ENCOUNTER — Ambulatory Visit (INDEPENDENT_AMBULATORY_CARE_PROVIDER_SITE_OTHER): Payer: Medicare Other | Admitting: Family Medicine

## 2017-07-21 VITALS — BP 121/65 | HR 56 | Temp 98.1°F | Wt 148.0 lb

## 2017-07-21 DIAGNOSIS — R21 Rash and other nonspecific skin eruption: Secondary | ICD-10-CM

## 2017-07-21 LAB — URINE CULTURE
MICRO NUMBER: 90145302
Result:: NO GROWTH
SPECIMEN QUALITY: ADEQUATE

## 2017-07-21 LAB — STREP A DNA PROBE: GROUP A STREP PROBE: NOT DETECTED

## 2017-07-21 MED ORDER — AMOXICILLIN 500 MG PO CAPS: 500.0000 mg | ORAL_CAPSULE | Freq: Three times a day (TID) | ORAL | 0 refills | Status: DC

## 2017-07-21 NOTE — Patient Instructions (Signed)
Thank you for coming in today.  Get labs now.  Take zyrtec twice daily.  Continue pepcid twice daily.  Use bandaryl as needed.  Use gold bond itch as needed.    Recheck Thursday or Friday.  Return sooner if needed.

## 2017-07-21 NOTE — Progress Notes (Signed)
Traci [name] is a 72 y.o. female who presents to [facility] [place] [facility] today for rash, sore throat, body aches.  Traci [name] notes rash associated with body aches and sore throat for a few days. The rash is itchy and bothersome. She has tried benadryl and pepcid which has helped a bit. She was seen in urgent care where a strep test was negative. She was told by a friend that he rash looks like measles. Traci [name] cannot remember if she had measles as a child. She is allergic to steroids. She is able to breath and eat normally.    Past Medical History:  Diagnosis Date  . Anxiety   . Atrial fibrillation ([place])   . CAD (coronary artery disease)   . Claudication ([place]) 01/06/2008   Lower extremity dopplers - no evidence of arterial insufficiency, normal exam  . Coronary artery disease   . GERD (gastroesophageal reflux disease)   . Hyperlipidemia   . Hypertension 11/30/2010   echo- EF 55%; normal w/ mildly sclerotic aortic valve  . Hypertension 11/05/11   renal dopplers - celiac artery and SMA >50% diameter reduction, R renal artery - mildly elevated velocities 1-59% diameter reduction, L renal artery normal  . Nonspecific ST-T wave electrocardiographic changes 03/26/2011   R/Lmv - EF 74%, normal perfusion all regions, ST depression w/ Lexiscan infusion w/o assoc angina  . Osteopenia   . PAD (peripheral artery disease) ([place])   . Peripheral vascular disease ([place])   . PVD (peripheral vascular disease) ([place]) 11/05/2011   doppler - R/L brachial pressures essentially equal w/o inflow disease; L sublclavian/CCA bypass graft demonstrates patent flow, no evidence of significant stenosis  . Sigmoid diverticulitis    Past Surgical History:  Procedure Laterality Date  . ABDOMINAL AORTIC ANEURYSM REPAIR N/A 01/27/2014   Procedure: AORTO-SUPERIOR MESENTERIC ARTERY BYPASS GRAFT;  Surgeon:  Angelia Mould, MD;  Location: [facility];  Service: Vascular;  Laterality: N/A;  . ABDOMINAL HYSTERECTOMY    . APPENDECTOMY    . CARDIAC CATHETERIZATION  06/03/2008   60% LAD, involving D2, borderline significant by IVUS, medical therapy. CFX, RCA OK.  . CHOLECYSTECTOMY    . EYE SURGERY     retinal surgery  . SPINE SURGERY  Feb. 2011  . [place]   X's  several  . SUBCLAVIAN ARTERY STENT Left 09/20/2008   LSA ISR 7x11mm Cordis Genesis on Opta premount, reduction from 80% to 0%  . subclavian artery stents  01/23/2010   Left carotid to subclavian artery Bypass  . VISCERAL ANGIOGRAM  01/17/2014   Procedure: VISCERAL ANGIOGRAM;  Surgeon: Angelia Mould, MD;  Location: [facility] CATH LAB;  Service: Cardiovascular;;   Social History   Tobacco Use  . Smoking status: Former Smoker    Packs/day: 0.25    Years: 20.00    Pack years: 5.00    Types: Cigarettes    Last attempt to quit: 09/30/1993    Years since quitting: 23.8  . Smokeless tobacco: Never Used  Substance Use Topics  . Alcohol use: No   family history includes Diabetes in her maternal grandmother; Heart attack in her father; Heart disease in her paternal grandfather; Heart disease (age of onset: 26) in her father; Hyperlipidemia in her father; Hypertension in her father; Kidney disease in her father; Kidney failure in her mother.  ROS as above:  Medications: Current Outpatient Medications  Medication Sig Dispense Refill  . aspirin EC 81  MG tablet Take 81 mg by mouth daily.    Marland Kitchen atenolol (TENORMIN) 25 MG tablet Take 1 tablet (25 mg total) by mouth daily. 90 tablet 3  . atorvastatin (LIPITOR) 10 MG tablet Take 1 tablet (10 mg total) daily at 6 PM by mouth. 90 tablet 3  . candesartan (ATACAND) 16 MG tablet Take 0.5 tablets (8 mg total) daily by mouth. 45 tablet 3  . cholecalciferol (VITAMIN D) 1000 UNITS tablet Take 1,000 Units by mouth daily.    . clopidogrel (PLAVIX) 75 MG tablet Take 1 tablet (75 mg total)  daily by mouth. 90 tablet 3  . clorazepate (TRANXENE) 7.5 MG tablet Take 1 tablet (7.5 mg total) by mouth daily as needed. 90 tablet 1  . cyanocobalamin 1000 MCG tablet Take 100 mcg by mouth daily.    Marland Kitchen EPINEPHrine (EPIPEN 2-PAK) 0.3 mg/0.3 mL IJ SOAJ injection Inject 0.3 mLs (0.3 mg total) into the muscle as needed (for allergic reaction). 2 Device 0  . esomeprazole (NEXIUM) 40 MG capsule Take 1 capsule (40 mg total) by mouth daily. Take 1 tab daily (Patient taking differently: Take 40 mg by mouth daily. ) 90 capsule 0  . estradiol (ESTRACE) 0.5 MG tablet TAKE 1 TABLET DAILY 90 tablet 1  . furosemide (LASIX) 20 MG tablet TAKE 1 TABLET EVERY OTHER DAY 45 tablet 2  . linaclotide (LINZESS) 145 MCG CAPS capsule Take 1 capsule by mouth daily as needed.    . meclizine (ANTIVERT) 25 MG tablet TAKE ONE-HALF (1/2) TABLET (12.5 MG) THREE TIMES A DAY AS NEEDED FOR DIZZINESS OR NAUSEA 90 tablet 1  . meloxicam (MOBIC) 15 MG tablet Take 1 tablet (15 mg total) by mouth daily. 30 tablet 0  . Multiple Vitamin (MULTIVITAMIN WITH MINERALS) TABS Take 1 tablet by mouth daily.    . polyethylene glycol (MIRALAX / GLYCOLAX) packet Take 17 g by mouth 2 (two) times daily. Until stooling regularly (Patient taking differently: Take 17 g by mouth as needed. Until stooling regularly) 30 packet 11  . zolpidem (AMBIEN) 10 MG tablet Take 1 tablet (10 mg total) by mouth at bedtime as needed for sleep. 90 tablet 0  . amoxicillin (AMOXIL) 500 MG capsule Take 1 capsule (500 mg total) by mouth 3 (three) times daily. 30 capsule 0   No current facility-administered medications for this visit.    Allergies  Allergen Reactions  . Cortisone Anaphylaxis  . Dilaudid [Hydromorphone Hcl] Nausea And Vomiting    Pt states she will start vomiting immediately for 6 hours  . Iodine Anaphylaxis  . Medrol [Methylprednisolone] Anaphylaxis  . Omnipaque [Iohexol] Anaphylaxis  . Prednisone Anaphylaxis  . Shellfish Allergy Anaphylaxis  . Sulfa  Drugs Cross Reactors Anaphylaxis  . Augmentin [Amoxicillin-Pot Clavulanate] Nausea Only    Upset stomach (Has taken cephalexin in the past without problems)  . Codeine Rash  . Erythromycin Rash  . Morphine And Related Nausea Only    nausea    Health Maintenance Health Maintenance  Topic Date Due  . MAMMOGRAM  11/30/2018  . TETANUS/TDAP  06/17/2020  . COLONOSCOPY  07/16/2025  . INFLUENZA VACCINE  Completed  . DEXA SCAN  Completed  . Hepatitis C Screening  Completed  . PNA vac Low Risk Adult  Completed     Exam:  BP 121/65   Pulse (!) 56   Temp 98.1 F (36.7 C) (Oral)   Wt 148 lb (67.1 kg)   BMI 25.40 kg/m  Gen: Well NAD, non-toxic appearing HEENT: EOMI,  MMM no conjunctivitis.  Posterior pharynx erythematous with no exudate.  No [place] spots seen  lungs: Normal work of breathing. CTABL Heart: RRR no MRG Abd: NABS, Soft. Nondistended, Nontender Exts: Brisk capillary refill, warm and well perfused.  Skin: Small oval macular scaly salmon colored lesions across trunk and extremities.  Results for orders placed or performed during the hospital encounter of 07/19/17 (from the past 72 hour(s))  POCT CBC w auto diff     Status: None   Collection Time: 07/19/17  9:43 AM  Result Value Ref Range   WBC  4.5 - 10.5 K/uL    Comment: see attached results   Lymphocytes relative %  15 - 45 %   Monocytes relative %  2 - 10 %   Neutrophils relative % (GR)  44 - 76 %   Lymphocytes absolute  0.1 - 1.8 K/uL   Monocyes absolute  0.1 - 1 K/uL   Neutrophils absolute (GR#)  1.7 - 7.8 K/uL   RBC  3.8 - 5.1 MIL/uL   Hemoglobin  11.8 - 15.5 g/dL   Hematocrit  34.8 - 46 %   MCV  78 - 100 fL   MCH  26 - 32 pg   MCHC  32 - 36.5 g/dL   RDW  11.6 - 14 %   Platelet count  140 - 400 K/uL   MPV  7.8 - 11 fL  POCT urinalysis dipstick     Status: Abnormal   Collection Time: 07/19/17  9:43 AM  Result Value Ref Range   Color, UA yellow yellow   Clarity, UA clear clear   Glucose, UA negative  negative mg/dL   Bilirubin, UA negative negative   Ketones, POC UA negative negative mg/dL   Spec Grav, UA 1.020 1.010 - 1.025   Blood, UA trace-intact (A) negative   pH, UA 6.0 5.0 - 8.0   Protein Ur, POC trace (A) negative mg/dL   Urobilinogen, UA 0.2 0.2 or 1.0 E.U./dL   Nitrite, UA Negative Negative   Leukocytes, UA Small (1+) (A) Negative  POCT rapid strep A     Status: None   Collection Time: 07/19/17  9:43 AM  Result Value Ref Range   Rapid Strep A Screen Negative Negative  Strep A DNA probe     Status: None   Collection Time: 07/19/17  9:43 AM  Result Value Ref Range   Group A Strep Probe NOT DETECTED NOT DETECT  Urine culture     Status: None   Collection Time: 07/19/17 11:05 AM  Result Value Ref Range   MICRO NUMBER: [id number]    SPECIMEN QUALITY: ADEQUATE    Sample Source URINE    STATUS: FINAL    Result: No Growth    No results found.    Assessment and Plan: 72 y.o. female with sore throat body aches and rash.  Unclear etiology.  Likely a viral process.  Scarlet fever is also a possibility.  Measles is very unlikely.  Plan for limited workup with CBC and CMP additionally will test for measles immunity and IgM.  Empiric treatment with amoxicillin for possible scarlet fever.  Continue antihistamines for rash.  Recheck in a few days.   Orders Placed This Encounter  Procedures  . Rubeola antibody, IgM  . Rubeola antibody IgG  . CBC with Differential/Platelet  . COMPLETE METABOLIC PANEL WITH GFR   Meds ordered this encounter  Medications  . amoxicillin (AMOXIL) 500 MG capsule    Sig: Take 1  capsule (500 mg total) by mouth 3 (three) times daily.    Dispense:  30 capsule    Refill:  0     Discussed warning signs or symptoms. Please see discharge instructions. Patient expresses understanding.  I spent 25 minutes with this patient, greater than 50% was face-to-face time counseling regarding ddx and treatment plan.

## 2017-07-24 ENCOUNTER — Ambulatory Visit (INDEPENDENT_AMBULATORY_CARE_PROVIDER_SITE_OTHER): Payer: Medicare Other | Admitting: Family Medicine

## 2017-07-24 ENCOUNTER — Ambulatory Visit: Payer: Medicare Other | Admitting: Sports Medicine

## 2017-07-24 ENCOUNTER — Encounter: Payer: Self-pay | Admitting: Family Medicine

## 2017-07-24 VITALS — BP 122/66 | HR 59 | Ht 64.0 in | Wt 149.0 lb

## 2017-07-24 DIAGNOSIS — R21 Rash and other nonspecific skin eruption: Secondary | ICD-10-CM

## 2017-07-24 LAB — CBC WITH DIFFERENTIAL/PLATELET
Basophils Absolute: 51 cells/uL (ref 0–200)
Basophils Relative: 1 %
EOS PCT: 4.7 %
Eosinophils Absolute: 240 cells/uL (ref 15–500)
HCT: 38.8 % (ref 35.0–45.0)
Hemoglobin: 13.4 g/dL (ref 11.7–15.5)
LYMPHS ABS: 1790 {cells}/uL (ref 850–3900)
MCH: 29.9 pg (ref 27.0–33.0)
MCHC: 34.5 g/dL (ref 32.0–36.0)
MCV: 86.6 fL (ref 80.0–100.0)
MPV: 9.9 fL (ref 7.5–12.5)
Monocytes Relative: 6.4 %
NEUTROS ABS: 2693 {cells}/uL (ref 1500–7800)
NEUTROS PCT: 52.8 %
Platelets: 335 10*3/uL (ref 140–400)
RBC: 4.48 10*6/uL (ref 3.80–5.10)
RDW: 12 % (ref 11.0–15.0)
Total Lymphocyte: 35.1 %
WBC mixed population: 326 cells/uL (ref 200–950)
WBC: 5.1 10*3/uL (ref 3.8–10.8)

## 2017-07-24 LAB — COMPLETE METABOLIC PANEL WITH GFR
AG Ratio: 1.4 (calc) (ref 1.0–2.5)
ALT: 19 U/L (ref 6–29)
AST: 26 U/L (ref 10–35)
Albumin: 4.3 g/dL (ref 3.6–5.1)
Alkaline phosphatase (APISO): 58 U/L (ref 33–130)
BUN/Creatinine Ratio: 11 (calc) (ref 6–22)
BUN: 12 mg/dL (ref 7–25)
CALCIUM: 9.2 mg/dL (ref 8.6–10.4)
CO2: 30 mmol/L (ref 20–32)
CREATININE: 1.08 mg/dL — AB (ref 0.60–0.93)
Chloride: 102 mmol/L (ref 98–110)
GFR, EST NON AFRICAN AMERICAN: 52 mL/min/{1.73_m2} — AB (ref >=60)
GFR, Est African American: 60 mL/min/{1.73_m2} (ref >=60)
GLOBULIN: 3.1 g/dL (ref 1.9–3.7)
Glucose, Bld: 89 mg/dL (ref 65–99)
Potassium: 4.5 mmol/L (ref 3.5–5.3)
SODIUM: 140 mmol/L (ref 135–146)
Total Bilirubin: 0.5 mg/dL (ref 0.2–1.2)
Total Protein: 7.4 g/dL (ref 6.1–8.1)

## 2017-07-24 LAB — RUBEOLA ANTIBODY IGG: RUBEOLA IGG: 255 [AU]/ml

## 2017-07-24 LAB — RUBEOLA ANTIBODY, IGM: Measles Ab IgM, IFA: <1:20 {titer}

## 2017-07-24 MED ORDER — EPINEPHRINE 0.3 MG/0.3ML IJ SOAJ: 0.3000 mg | INTRAMUSCULAR | 0 refills | Status: DC | PRN

## 2017-07-24 NOTE — Patient Instructions (Addendum)
Thank you for coming in today. STOP amoxacillin at 7 days.  Continue antihistamines for 2 weeks.  Recheck with me as needed.

## 2017-07-24 NOTE — Progress Notes (Signed)
Traci [name] is a 72 y.o. female who presents to [facility]: [place] today for follow-up rash and sore throat.  Melysa was seen 3 days ago for sore throat and mildly pruritic rash.  This was thought to be either viral rash or possibly scarlet fever.  Although her strep test was negative I treated empirically with amoxicillin as well as antihistamines.  She notes that she is feeling a lot better notes the rash is resolving.  She no longer has a sore throat and her hoarse voice is improving.   Past Medical History:  Diagnosis Date  . Anxiety   . Atrial fibrillation ([place])   . CAD (coronary artery disease)   . Claudication ([place]) 01/06/2008   Lower extremity dopplers - no evidence of arterial insufficiency, normal exam  . Coronary artery disease   . GERD (gastroesophageal reflux disease)   . Hyperlipidemia   . Hypertension 11/30/2010   echo- EF 55%; normal w/ mildly sclerotic aortic valve  . Hypertension 11/05/11   renal dopplers - celiac artery and SMA >50% diameter reduction, R renal artery - mildly elevated velocities 1-59% diameter reduction, L renal artery normal  . Nonspecific ST-T wave electrocardiographic changes 03/26/2011   R/Lmv - EF 74%, normal perfusion all regions, ST depression w/ Lexiscan infusion w/o assoc angina  . Osteopenia   . PAD (peripheral artery disease) ([place])   . Peripheral vascular disease ([place])   . PVD (peripheral vascular disease) ([place]) 11/05/2011   doppler - R/L brachial pressures essentially equal w/o inflow disease; L sublclavian/CCA bypass graft demonstrates patent flow, no evidence of significant stenosis  . Sigmoid diverticulitis    Past Surgical History:  Procedure Laterality Date  . ABDOMINAL AORTIC ANEURYSM REPAIR N/A 01/27/2014   Procedure: AORTO-SUPERIOR MESENTERIC ARTERY BYPASS GRAFT;  Surgeon: Angelia Mould, MD;   Location: [facility];  Service: Vascular;  Laterality: N/A;  . ABDOMINAL HYSTERECTOMY    . APPENDECTOMY    . CARDIAC CATHETERIZATION  06/03/2008   60% LAD, involving D2, borderline significant by IVUS, medical therapy. CFX, RCA OK.  . CHOLECYSTECTOMY    . EYE SURGERY     retinal surgery  . SPINE SURGERY  Feb. 2011  . [place]   X's  several  . SUBCLAVIAN ARTERY STENT Left 09/20/2008   LSA ISR 7x87mm Cordis Genesis on Opta premount, reduction from 80% to 0%  . subclavian artery stents  01/23/2010   Left carotid to subclavian artery Bypass  . VISCERAL ANGIOGRAM  01/17/2014   Procedure: VISCERAL ANGIOGRAM;  Surgeon: Angelia Mould, MD;  Location: [facility] CATH LAB;  Service: Cardiovascular;;   Social History   Tobacco Use  . Smoking status: Former Smoker    Packs/day: 0.25    Years: 20.00    Pack years: 5.00    Types: Cigarettes    Last attempt to quit: 09/30/1993    Years since quitting: 23.8  . Smokeless tobacco: Never Used  Substance Use Topics  . Alcohol use: No   family history includes Diabetes in her maternal grandmother; Heart attack in her father; Heart disease in her paternal grandfather; Heart disease (age of onset: 51) in her father; Hyperlipidemia in her father; Hypertension in her father; Kidney disease in her father; Kidney failure in her mother.  ROS as above:  Medications: Current Outpatient Medications  Medication Sig Dispense Refill  . aspirin EC 81 MG tablet Take 81 mg by mouth daily.    Marland Kitchen  atenolol (TENORMIN) 25 MG tablet Take 1 tablet (25 mg total) by mouth daily. 90 tablet 3  . atorvastatin (LIPITOR) 10 MG tablet Take 1 tablet (10 mg total) daily at 6 PM by mouth. 90 tablet 3  . candesartan (ATACAND) 16 MG tablet Take 0.5 tablets (8 mg total) daily by mouth. 45 tablet 3  . cholecalciferol (VITAMIN D) 1000 UNITS tablet Take 1,000 Units by mouth daily.    . clopidogrel (PLAVIX) 75 MG tablet Take 1 tablet (75 mg total) daily by mouth. 90 tablet 3    . clorazepate (TRANXENE) 7.5 MG tablet Take 1 tablet (7.5 mg total) by mouth daily as needed. 90 tablet 1  . cyanocobalamin 1000 MCG tablet Take 100 mcg by mouth daily.    Marland Kitchen EPINEPHrine (EPIPEN 2-PAK) 0.3 mg/0.3 mL IJ SOAJ injection Inject 0.3 mLs (0.3 mg total) into the muscle as needed (for allergic reaction). 2 Device 0  . esomeprazole (NEXIUM) 40 MG capsule Take 1 capsule (40 mg total) by mouth daily. Take 1 tab daily (Patient taking differently: Take 40 mg by mouth daily. ) 90 capsule 0  . estradiol (ESTRACE) 0.5 MG tablet TAKE 1 TABLET DAILY 90 tablet 1  . furosemide (LASIX) 20 MG tablet TAKE 1 TABLET EVERY OTHER DAY 45 tablet 2  . linaclotide (LINZESS) 145 MCG CAPS capsule Take 1 capsule by mouth daily as needed.    . meclizine (ANTIVERT) 25 MG tablet TAKE ONE-HALF (1/2) TABLET (12.5 MG) THREE TIMES A DAY AS NEEDED FOR DIZZINESS OR NAUSEA 90 tablet 1  . meloxicam (MOBIC) 15 MG tablet Take 1 tablet (15 mg total) by mouth daily. 30 tablet 0  . Multiple Vitamin (MULTIVITAMIN WITH MINERALS) TABS Take 1 tablet by mouth daily.    . polyethylene glycol (MIRALAX / GLYCOLAX) packet Take 17 g by mouth 2 (two) times daily. Until stooling regularly (Patient taking differently: Take 17 g by mouth as needed. Until stooling regularly) 30 packet 11  . zolpidem (AMBIEN) 10 MG tablet Take 1 tablet (10 mg total) by mouth at bedtime as needed for sleep. 90 tablet 0   No current facility-administered medications for this visit.    Allergies  Allergen Reactions  . Cortisone Anaphylaxis  . Dilaudid [Hydromorphone Hcl] Nausea And Vomiting    Pt states she will start vomiting immediately for 6 hours  . Iodine Anaphylaxis  . Medrol [Methylprednisolone] Anaphylaxis  . Omnipaque [Iohexol] Anaphylaxis  . Prednisone Anaphylaxis  . Shellfish Allergy Anaphylaxis  . Sulfa Drugs Cross Reactors Anaphylaxis  . Augmentin [Amoxicillin-Pot Clavulanate] Nausea Only    Upset stomach (Has taken cephalexin in the past  without problems)  . Codeine Rash  . Erythromycin Rash  . Morphine And Related Nausea Only    nausea    Health Maintenance Health Maintenance  Topic Date Due  . MAMMOGRAM  11/30/2018  . TETANUS/TDAP  06/17/2020  . COLONOSCOPY  07/16/2025  . INFLUENZA VACCINE  Completed  . DEXA SCAN  Completed  . Hepatitis C Screening  Completed  . PNA vac Low Risk Adult  Completed     Exam:  BP 122/66   Pulse (!) 59   Ht 5\' 4"  (1.626 m)   Wt 149 lb (67.6 kg)   BMI 25.58 kg/m  Gen: Well NAD nontoxic appearing HEENT: EOMI,  MMM normal posterior pharynx Lungs: Normal work of breathing. CTABL Heart: Slight bradycardia no MRG Abd: NABS, Soft. Nondistended, Nontender Exts: Brisk capillary refill, warm and well perfused.  Skin: Faint macular erythematous rash  across his trunk and extremities  No results found for this or any previous visit (from the past 72 hour(s)). No results found.    Assessment and Plan: 72 y.o. female with resolving rash and sore throat.  Unclear etiology likely scarlet fever.  Complete 7 days of oral amoxicillin and continue antihistamines for 2 weeks.  Recheck as needed.  EpiPen refilled as it was expired.  No orders of the defined types were placed in this encounter.  Meds ordered this encounter  Medications  . EPINEPHrine (EPIPEN 2-PAK) 0.3 mg/0.3 mL IJ SOAJ injection    Sig: Inject 0.3 mLs (0.3 mg total) into the muscle as needed (for allergic reaction).    Dispense:  2 Device    Refill:  0     Discussed warning signs or symptoms. Please see discharge instructions. Patient expresses understanding.  I spent 15 minutes with this patient, greater than 50% was face-to-face time counseling regarding ddx and plan.

## 2017-08-04 ENCOUNTER — Encounter: Payer: Self-pay | Admitting: Family Medicine

## 2017-08-04 ENCOUNTER — Ambulatory Visit (INDEPENDENT_AMBULATORY_CARE_PROVIDER_SITE_OTHER): Payer: Medicare Other | Admitting: Family Medicine

## 2017-08-04 ENCOUNTER — Other Ambulatory Visit: Payer: Self-pay | Admitting: Sports Medicine

## 2017-08-04 VITALS — BP 129/41 | HR 58 | Temp 98.1°F | Wt 149.0 lb

## 2017-08-04 DIAGNOSIS — J101 Influenza due to other identified influenza virus with other respiratory manifestations: Secondary | ICD-10-CM | POA: Diagnosis not present

## 2017-08-04 DIAGNOSIS — R05 Cough: Secondary | ICD-10-CM

## 2017-08-04 DIAGNOSIS — N1831 Chronic kidney disease, stage 3a: Secondary | ICD-10-CM | POA: Insufficient documentation

## 2017-08-04 DIAGNOSIS — R52 Pain, unspecified: Secondary | ICD-10-CM

## 2017-08-04 DIAGNOSIS — N183 Chronic kidney disease, stage 3 unspecified: Secondary | ICD-10-CM

## 2017-08-04 DIAGNOSIS — M1812 Unilateral primary osteoarthritis of first carpometacarpal joint, left hand: Secondary | ICD-10-CM

## 2017-08-04 DIAGNOSIS — R059 Cough, unspecified: Secondary | ICD-10-CM

## 2017-08-04 HISTORY — DX: Chronic kidney disease, stage 3 unspecified: N18.30

## 2017-08-04 LAB — POCT INFLUENZA A/B
INFLUENZA B, POC: POSITIVE — AB
Influenza A, POC: NEGATIVE

## 2017-08-04 MED ORDER — OSELTAMIVIR PHOSPHATE 30 MG PO CAPS: 30.0000 mg | ORAL_CAPSULE | Freq: Two times a day (BID) | ORAL | 0 refills | Status: DC

## 2017-08-04 NOTE — Progress Notes (Signed)
Traci [name] is a 72 y.o. female who presents to [facility]: [place] today for flu-like symptoms.   Patient developed flu-like symptoms beginning on Saturday. She is experiencing severe body aches, headaches, sore throat, congestion, cough. Also measured fever to 101 on Saturday which has since resolved. Patient has been ill recently with sore throat and rash concerning for scarlet fever. Completed 10 day course of antibiotics recently. Otherwise, patient is somewhat short of breath. Denies nausea/vomiting, GI changes, diarrhea, or abdominal pain.   Patient lives at home with adult son who is healthy to her knowledge. No known sick contacts.    Past Medical History:  Diagnosis Date  . Anxiety   . Atrial fibrillation ([place])   . CAD (coronary artery disease)   . Claudication ([place]) 01/06/2008   Lower extremity dopplers - no evidence of arterial insufficiency, normal exam  . Coronary artery disease   . GERD (gastroesophageal reflux disease)   . Hyperlipidemia   . Hypertension 11/30/2010   echo- EF 55%; normal w/ mildly sclerotic aortic valve  . Hypertension 11/05/11   renal dopplers - celiac artery and SMA >50% diameter reduction, R renal artery - mildly elevated velocities 1-59% diameter reduction, L renal artery normal  . Nonspecific ST-T wave electrocardiographic changes 03/26/2011   R/Lmv - EF 74%, normal perfusion all regions, ST depression w/ Lexiscan infusion w/o assoc angina  . Osteopenia   . PAD (peripheral artery disease) ([place])   . Peripheral vascular disease ([place])   . PVD (peripheral vascular disease) ([place]) 11/05/2011   doppler - R/L brachial pressures essentially equal w/o inflow disease; L sublclavian/CCA bypass graft demonstrates patent flow, no evidence of significant stenosis  . Sigmoid diverticulitis    Past Surgical History:  Procedure Laterality  Date  . ABDOMINAL AORTIC ANEURYSM REPAIR N/A 01/27/2014   Procedure: AORTO-SUPERIOR MESENTERIC ARTERY BYPASS GRAFT;  Surgeon: Angelia Mould, MD;  Location: [facility];  Service: Vascular;  Laterality: N/A;  . ABDOMINAL HYSTERECTOMY    . APPENDECTOMY    . CARDIAC CATHETERIZATION  06/03/2008   60% LAD, involving D2, borderline significant by IVUS, medical therapy. CFX, RCA OK.  . CHOLECYSTECTOMY    . EYE SURGERY     retinal surgery  . SPINE SURGERY  Feb. 2011  . [place]   X's  several  . SUBCLAVIAN ARTERY STENT Left 09/20/2008   LSA ISR 7x40mm Cordis Genesis on Opta premount, reduction from 80% to 0%  . subclavian artery stents  01/23/2010   Left carotid to subclavian artery Bypass  . VISCERAL ANGIOGRAM  01/17/2014   Procedure: VISCERAL ANGIOGRAM;  Surgeon: Angelia Mould, MD;  Location: [facility] CATH LAB;  Service: Cardiovascular;;   Social History   Tobacco Use  . Smoking status: Former Smoker    Packs/day: 0.25    Years: 20.00    Pack years: 5.00    Types: Cigarettes    Last attempt to quit: 09/30/1993    Years since quitting: 23.8  . Smokeless tobacco: Never Used  Substance Use Topics  . Alcohol use: No   family history includes Diabetes in her maternal grandmother; Heart attack in her father; Heart disease in her paternal grandfather; Heart disease (age of onset: 23) in her father; Hyperlipidemia in her father; Hypertension in her father; Kidney disease in her father; Kidney failure in her mother.  ROS as above:  Medications: Current Outpatient Medications  Medication Sig Dispense Refill  .  aspirin EC 81 MG tablet Take 81 mg by mouth daily.    Marland Kitchen atenolol (TENORMIN) 25 MG tablet Take 1 tablet (25 mg total) by mouth daily. 90 tablet 3  . atorvastatin (LIPITOR) 10 MG tablet Take 1 tablet (10 mg total) daily at 6 PM by mouth. 90 tablet 3  . candesartan (ATACAND) 16 MG tablet Take 0.5 tablets (8 mg total) daily by mouth. 45 tablet 3  . cholecalciferol  (VITAMIN D) 1000 UNITS tablet Take 1,000 Units by mouth daily.    . clopidogrel (PLAVIX) 75 MG tablet Take 1 tablet (75 mg total) daily by mouth. 90 tablet 3  . clorazepate (TRANXENE) 7.5 MG tablet Take 1 tablet (7.5 mg total) by mouth daily as needed. 90 tablet 1  . cyanocobalamin 1000 MCG tablet Take 100 mcg by mouth daily.    Marland Kitchen EPINEPHrine (EPIPEN 2-PAK) 0.3 mg/0.3 mL IJ SOAJ injection Inject 0.3 mLs (0.3 mg total) into the muscle as needed (for allergic reaction). 2 Device 0  . esomeprazole (NEXIUM) 40 MG capsule Take 1 capsule (40 mg total) by mouth daily. Take 1 tab daily (Patient taking differently: Take 40 mg by mouth daily. ) 90 capsule 0  . estradiol (ESTRACE) 0.5 MG tablet TAKE 1 TABLET DAILY 90 tablet 1  . furosemide (LASIX) 20 MG tablet TAKE 1 TABLET EVERY OTHER DAY 45 tablet 2  . linaclotide (LINZESS) 145 MCG CAPS capsule Take 1 capsule by mouth daily as needed.    . meclizine (ANTIVERT) 25 MG tablet TAKE ONE-HALF (1/2) TABLET (12.5 MG) THREE TIMES A DAY AS NEEDED FOR DIZZINESS OR NAUSEA 90 tablet 1  . meloxicam (MOBIC) 15 MG tablet Take 1 tablet (15 mg total) by mouth daily. 30 tablet 0  . Multiple Vitamin (MULTIVITAMIN WITH MINERALS) TABS Take 1 tablet by mouth daily.    . polyethylene glycol (MIRALAX / GLYCOLAX) packet Take 17 g by mouth 2 (two) times daily. Until stooling regularly (Patient taking differently: Take 17 g by mouth as needed. Until stooling regularly) 30 packet 11  . zolpidem (AMBIEN) 10 MG tablet Take 1 tablet (10 mg total) by mouth at bedtime as needed for sleep. 90 tablet 0  . oseltamivir (TAMIFLU) 30 MG capsule Take 1 capsule (30 mg total) by mouth 2 (two) times daily. 10 capsule 0   No current facility-administered medications for this visit.    Allergies  Allergen Reactions  . Cortisone Anaphylaxis  . Dilaudid [Hydromorphone Hcl] Nausea And Vomiting    Pt states she will start vomiting immediately for 6 hours  . Iodine Anaphylaxis  . Medrol  [Methylprednisolone] Anaphylaxis  . Omnipaque [Iohexol] Anaphylaxis  . Prednisone Anaphylaxis  . Shellfish Allergy Anaphylaxis  . Sulfa Drugs Cross Reactors Anaphylaxis  . Augmentin [Amoxicillin-Pot Clavulanate] Nausea Only    Upset stomach (Has taken cephalexin in the past without problems)  . Codeine Rash  . Erythromycin Rash  . Morphine And Related Nausea Only    nausea    Health Maintenance Health Maintenance  Topic Date Due  . MAMMOGRAM  11/30/2018  . TETANUS/TDAP  06/17/2020  . COLONOSCOPY  07/16/2025  . INFLUENZA VACCINE  Completed  . DEXA SCAN  Completed  . Hepatitis C Screening  Completed  . PNA vac Low Risk Adult  Completed     Exam:  BP (!) 129/41   Pulse (!) 58   Temp 98.1 F (36.7 C) (Oral)   Wt 149 lb (67.6 kg)   SpO2 98%   BMI 25.58 kg/m  Gen: Well NAD HEENT: EOMI,  MMM. TMs normal bilaterally. Erythematous oropharynx without exudate.  Lungs: Normal work of breathing. No crackles or wheezes.  Heart: RRR no MRG Abd: NABS, Soft. Nondistended, Nontender Exts: Brisk capillary refill, warm and well perfused.  Skin: diffuse 0.5-2 cm erythematous rough, raised patched, seen at last visit. On trunk and extremities.    Chemistry      Component Value Date/Time   NA 140 07/21/2017 1215   NA 144 04/07/2013 0857   K 4.5 07/21/2017 1215   CL 102 07/21/2017 1215   CO2 30 07/21/2017 1215   BUN 12 07/21/2017 1215   BUN 10 04/07/2013 0857   CREATININE 1.08 (H) 07/21/2017 1215      Component Value Date/Time   CALCIUM 9.2 07/21/2017 1215   ALKPHOS 55 01/21/2017 1456   AST 26 07/21/2017 1215   ALT 19 07/21/2017 1215   BILITOT 0.5 07/21/2017 1215      Results for orders placed or performed in visit on 08/04/17 (from the past 72 hour(s))  POCT Influenza A/B     Status: Abnormal   Collection Time: 08/04/17 11:37 AM  Result Value Ref Range   Influenza A, POC Negative Negative   Influenza B, POC Positive (A) Negative   No results found.  Assessment and  Plan: 72 y.o. female with Influenza B. Patient tested positive for influenza B in office today. She does not currently have signs or symptoms concerning for pneumonia or other source of fever. Patient amenable to treatment with Tamiflu. Patient with Creatinine clearance in 50s, so will renally dose. Otherwise symptomatic management. Patient lives with adult son who does not have any chronic medical conditions. Do not believe Tamiflu prophylaxis is necessary. Counseled on hand hygiene and wearing mask.   Kidney disease: As noted above doing pretty well.  Does suggest Tamiflu as noted above continue current blood pressure lipid and antiplatelet management.   Patient should return to follow up if not improving or worsening over next several days.    Orders Placed This Encounter  Procedures  . POCT Influenza A/B   Meds ordered this encounter  Medications  . oseltamivir (TAMIFLU) 30 MG capsule    Sig: Take 1 capsule (30 mg total) by mouth 2 (two) times daily.    Dispense:  10 capsule    Refill:  0     Discussed warning signs or symptoms. Please see discharge instructions. Patient expresses understanding.

## 2017-08-04 NOTE — Patient Instructions (Signed)
Thank you for coming in today. Take tamiflu twice daily for 5 days.  Continue over the counter medicine for body aches and pain as needed.  Recheck with me as needed if not better.    Influenza, Adult Influenza, more commonly known as "the flu," is a viral infection that primarily affects the respiratory tract. The respiratory tract includes organs that help you breathe, such as the lungs, nose, and throat. The flu causes many common cold symptoms, as well as a high fever and body aches. The flu spreads easily from person to person (is contagious). Getting a flu shot (influenza vaccination) every year is the best way to prevent influenza. What are the causes? Influenza is caused by a virus. You can catch the virus by:  Breathing in droplets from an infected person's cough or sneeze.  Touching something that was recently contaminated with the virus and then touching your mouth, nose, or eyes.  What increases the risk? The following factors may make you more likely to get the flu:  Not cleaning your hands frequently with soap and water or alcohol-based hand sanitizer.  Having close contact with many people during cold and flu season.  Touching your mouth, eyes, or nose without washing or sanitizing your hands first.  Not drinking enough fluids or not eating a healthy diet.  Not getting enough sleep or exercise.  Being under a high amount of stress.  Not getting a yearly (annual) flu shot.  You may be at a higher risk of complications from the flu, such as a severe lung infection (pneumonia), if you:  Are over the age of 73.  Are pregnant.  Have a weakened disease-fighting system (immune system). You may have a weakened immune system if you: ? Have HIV or AIDS. ? Are undergoing chemotherapy. ? Aretaking medicines that reduce the activity of (suppress) the immune system.  Have a long-term (chronic) illness, such as heart disease, kidney disease, diabetes, or lung  disease.  Have a liver disorder.  Are obese.  Have anemia.  What are the signs or symptoms? Symptoms of this condition typically last 4-10 days and may include:  Fever.  Chills.  Headache, body aches, or muscle aches.  Sore throat.  Cough.  Runny or congested nose.  Chest discomfort and cough.  Poor appetite.  Weakness or tiredness (fatigue).  Dizziness.  Nausea or vomiting.  How is this diagnosed? This condition may be diagnosed based on your medical history and a physical exam. Your health care provider may do a nose or throat swab test to confirm the diagnosis. How is this treated? If influenza is detected early, you can be treated with antiviral medicine that can reduce the length of your illness and the severity of your symptoms. This medicine may be given by mouth (orally) or through an IV tube that is inserted in one of your veins. The goal of treatment is to relieve symptoms by taking care of yourself at home. This may include taking over-the-counter medicines, drinking plenty of fluids, and adding humidity to the air in your home. In some cases, influenza goes away on its own. Severe influenza or complications from influenza may be treated in a hospital. Follow these instructions at home:  Take over-the-counter and prescription medicines only as told by your health care provider.  Use a cool mist humidifier to add humidity to the air in your home. This can make breathing easier.  Rest as needed.  Drink enough fluid to keep your urine clear or  pale yellow.  Cover your mouth and nose when you cough or sneeze.  Wash your hands with soap and water often, especially after you cough or sneeze. If soap and water are not available, use hand sanitizer.  Stay home from work or school as told by your health care provider. Unless you are visiting your health care provider, try to avoid leaving home until your fever has been gone for 24 hours without the use of  medicine.  Keep all follow-up visits as told by your health care provider. This is important. How is this prevented?  Getting an annual flu shot is the best way to avoid getting the flu. You may get the flu shot in late summer, fall, or winter. Ask your health care provider when you should get your flu shot.  Wash your hands often or use hand sanitizer often.  Avoid contact with people who are sick during cold and flu season.  Eat a healthy diet, drink plenty of fluids, get enough sleep, and exercise regularly. Contact a health care provider if:  You develop new symptoms.  You have: ? Chest pain. ? Diarrhea. ? A fever.  Your cough gets worse.  You produce more mucus.  You feel nauseous or you vomit. Get help right away if:  You develop shortness of breath or difficulty breathing.  Your skin or nails turn a bluish color.  You have severe pain or stiffness in your neck.  You develop a sudden headache or sudden pain in your face or ear.  You cannot stop vomiting. This information is not intended to replace advice given to you by your health care provider. Make sure you discuss any questions you have with your health care provider. Document Released: 05/31/2000 Document Revised: 11/09/2015 Document Reviewed: 03/28/2015 Elsevier Interactive Patient Education  2017 [employer].

## 2017-08-10 ENCOUNTER — Other Ambulatory Visit: Payer: Self-pay | Admitting: Family Medicine

## 2017-08-12 ENCOUNTER — Ambulatory Visit (INDEPENDENT_AMBULATORY_CARE_PROVIDER_SITE_OTHER): Payer: Medicare Other | Admitting: Family Medicine

## 2017-08-12 ENCOUNTER — Other Ambulatory Visit: Payer: Self-pay | Admitting: Family Medicine

## 2017-08-12 ENCOUNTER — Encounter: Payer: Self-pay | Admitting: Family Medicine

## 2017-08-12 ENCOUNTER — Ambulatory Visit (INDEPENDENT_AMBULATORY_CARE_PROVIDER_SITE_OTHER): Payer: Medicare Other

## 2017-08-12 VITALS — BP 144/71 | HR 61 | Temp 97.6°F | Ht 64.0 in | Wt 146.5 lb

## 2017-08-12 DIAGNOSIS — R059 Cough, unspecified: Secondary | ICD-10-CM

## 2017-08-12 DIAGNOSIS — R05 Cough: Secondary | ICD-10-CM

## 2017-08-12 DIAGNOSIS — I700 Atherosclerosis of aorta: Secondary | ICD-10-CM

## 2017-08-12 MED ORDER — BENZONATATE 200 MG PO CAPS: 200.0000 mg | ORAL_CAPSULE | Freq: Three times a day (TID) | ORAL | 0 refills | Status: DC | PRN

## 2017-08-12 NOTE — Progress Notes (Signed)
Traci [name] is a 72 y.o. female who presents to [facility]: [facility] today for cough.  Gweneth has had several recent illnesses.  She had what was thought to be a upper respiratory viral illness in early February and then subsequently influenza B in mid February.  She no longer has fevers chills or body aches but has a persistent cough.  The cough is become productive of yellowish sputum.  She notes occasional shortness of breath as well.  She denies chest pain or palpitations.  She denies wheezing.  She is not really tried much treatment yet but notes the cough is interfering with sleep at times.   Past Medical History:  Diagnosis Date  . Anxiety   . Atrial fibrillation ([place])   . CAD (coronary artery disease)   . Claudication ([place]) 01/06/2008   Lower extremity dopplers - no evidence of arterial insufficiency, normal exam  . Coronary artery disease   . GERD (gastroesophageal reflux disease)   . Hyperlipidemia   . Hypertension 11/30/2010   echo- EF 55%; normal w/ mildly sclerotic aortic valve  . Hypertension 11/05/11   renal dopplers - celiac artery and SMA >50% diameter reduction, R renal artery - mildly elevated velocities 1-59% diameter reduction, L renal artery normal  . Nonspecific ST-T wave electrocardiographic changes 03/26/2011   R/Lmv - EF 74%, normal perfusion all regions, ST depression w/ Lexiscan infusion w/o assoc angina  . Osteopenia   . PAD (peripheral artery disease) ([place])   . Peripheral vascular disease ([place])   . PVD (peripheral vascular disease) ([place]) 11/05/2011   doppler - R/L brachial pressures essentially equal w/o inflow disease; L sublclavian/CCA bypass graft demonstrates patent flow, no evidence of significant stenosis  . Sigmoid diverticulitis    Past Surgical History:  Procedure Laterality Date  . ABDOMINAL AORTIC ANEURYSM REPAIR N/A  01/27/2014   Procedure: AORTO-SUPERIOR MESENTERIC ARTERY BYPASS GRAFT;  Surgeon: Angelia Mould, MD;  Location: [facility];  Service: Vascular;  Laterality: N/A;  . ABDOMINAL HYSTERECTOMY    . APPENDECTOMY    . CARDIAC CATHETERIZATION  06/03/2008   60% LAD, involving D2, borderline significant by IVUS, medical therapy. CFX, RCA OK.  . CHOLECYSTECTOMY    . EYE SURGERY     retinal surgery  . SPINE SURGERY  Feb. 2011  . [place]   X's  several  . SUBCLAVIAN ARTERY STENT Left 09/20/2008   LSA ISR 7x38mm Cordis Genesis on Opta premount, reduction from 80% to 0%  . subclavian artery stents  01/23/2010   Left carotid to subclavian artery Bypass  . VISCERAL ANGIOGRAM  01/17/2014   Procedure: VISCERAL ANGIOGRAM;  Surgeon: Angelia Mould, MD;  Location: [facility] CATH LAB;  Service: Cardiovascular;;   Social History   Tobacco Use  . Smoking status: Former Smoker    Packs/day: 0.25    Years: 20.00    Pack years: 5.00    Types: Cigarettes    Last attempt to quit: 09/30/1993    Years since quitting: 23.8  . Smokeless tobacco: Never Used  Substance Use Topics  . Alcohol use: No   family history includes Diabetes in her maternal grandmother; Heart attack in her father; Heart disease in her paternal grandfather; Heart disease (age of onset: 71) in her father; Hyperlipidemia in her father; Hypertension in her father; Kidney disease in her father; Kidney failure in her mother.  ROS as above:  Medications: Current Outpatient Medications  Medication Sig Dispense Refill  . aspirin EC 81 MG tablet Take 81 mg by mouth daily.    Marland Kitchen atenolol (TENORMIN) 25 MG tablet Take 1 tablet (25 mg total) by mouth daily. 90 tablet 3  . atorvastatin (LIPITOR) 10 MG tablet Take 1 tablet (10 mg total) daily at 6 PM by mouth. 90 tablet 3  . candesartan (ATACAND) 16 MG tablet Take 0.5 tablets (8 mg total) daily by mouth. 45 tablet 3  . cholecalciferol (VITAMIN D) 1000 UNITS tablet Take 1,000 Units by  mouth daily.    . clopidogrel (PLAVIX) 75 MG tablet Take 1 tablet (75 mg total) daily by mouth. 90 tablet 3  . clorazepate (TRANXENE) 7.5 MG tablet Take 1 tablet (7.5 mg total) by mouth daily as needed. 90 tablet 1  . cyanocobalamin 1000 MCG tablet Take 100 mcg by mouth daily.    Marland Kitchen EPINEPHrine (EPIPEN 2-PAK) 0.3 mg/0.3 mL IJ SOAJ injection Inject 0.3 mLs (0.3 mg total) into the muscle as needed (for allergic reaction). 2 Device 0  . esomeprazole (NEXIUM) 40 MG capsule Take 1 capsule (40 mg total) by mouth daily. Take 1 tab daily (Patient taking differently: Take 40 mg by mouth daily. ) 90 capsule 0  . estradiol (ESTRACE) 0.5 MG tablet TAKE 1 TABLET DAILY 90 tablet 1  . furosemide (LASIX) 20 MG tablet TAKE 1 TABLET EVERY OTHER DAY 45 tablet 2  . linaclotide (LINZESS) 145 MCG CAPS capsule Take 1 capsule by mouth daily as needed.    . meclizine (ANTIVERT) 25 MG tablet TAKE ONE-HALF (1/2) TABLET THREE TIMES A DAY AS NEEDED FOR DIZZINESS OR NAUSEA 90 tablet 1  . meloxicam (MOBIC) 15 MG tablet TAKE 1 TABLET BY MOUTH DAILY 30 tablet 0  . Multiple Vitamin (MULTIVITAMIN WITH MINERALS) TABS Take 1 tablet by mouth daily.    . polyethylene glycol (MIRALAX / GLYCOLAX) packet Take 17 g by mouth 2 (two) times daily. Until stooling regularly (Patient taking differently: Take 17 g by mouth as needed. Until stooling regularly) 30 packet 11  . zolpidem (AMBIEN) 10 MG tablet Take 1 tablet (10 mg total) by mouth at bedtime as needed for sleep. 90 tablet 0  . benzonatate (TESSALON) 200 MG capsule Take 1 capsule (200 mg total) by mouth 3 (three) times daily as needed for cough. 45 capsule 0  . oseltamivir (TAMIFLU) 30 MG capsule Take 1 capsule (30 mg total) by mouth 2 (two) times daily. (Patient not taking: Reported on 08/12/2017) 10 capsule 0   No current facility-administered medications for this visit.    Allergies  Allergen Reactions  . Cortisone Anaphylaxis  . Dilaudid [Hydromorphone Hcl] Nausea And Vomiting     Pt states she will start vomiting immediately for 6 hours  . Iodine Anaphylaxis  . Medrol [Methylprednisolone] Anaphylaxis  . Omnipaque [Iohexol] Anaphylaxis  . Prednisone Anaphylaxis  . Shellfish Allergy Anaphylaxis  . Sulfa Drugs Cross Reactors Anaphylaxis  . Augmentin [Amoxicillin-Pot Clavulanate] Nausea Only    Upset stomach (Has taken cephalexin in the past without problems)  . Codeine Rash  . Erythromycin Rash  . Morphine And Related Nausea Only    nausea    Health Maintenance Health Maintenance  Topic Date Due  . MAMMOGRAM  11/30/2018  . TETANUS/TDAP  06/17/2020  . COLONOSCOPY  07/16/2025  . INFLUENZA VACCINE  Completed  . DEXA SCAN  Completed  . Hepatitis C Screening  Completed  . PNA vac Low Risk Adult  Completed     Exam:  BP (!) 144/71   Pulse 61   Temp 97.6 F (36.4 C) (Oral)   Ht 5\' 4"  (1.626 m)   Wt 146 lb 8 oz (66.5 kg)   SpO2 97%   BMI 25.15 kg/m  Gen: Well NAD HEENT: EOMI,  MMM clear nasal discharge.  Normal posterior pharynx Lungs: Normal work of breathing. CTABL Heart: RRR no MRG Abd: NABS, Soft. Nondistended, Nontender Exts: Brisk capillary refill, warm and well perfused.    No results found for this or any previous visit (from the past 72 hour(s)). Dg Chest 2 View  Result Date: 08/12/2017 CLINICAL DATA:  Cough EXAM: CHEST  2 VIEW COMPARISON:  March 03, 2017 FINDINGS: Lungs are slightly hyperexpanded. There is mild scarring in the left base. There is no edema or consolidation. Heart size and pulmonary vascularity are normal. No adenopathy. There is aortic atherosclerosis. There is a left subclavian stent. There is postoperative change in the left axillary region. No evident bone lesions. IMPRESSION: Mild scarring left base. Lungs mildly hyperexpanded. No edema or consolidation. Stable cardiac silhouette. There is aortic atherosclerosis. Left subclavian stent present. Aortic Atherosclerosis (ICD10-I70.0). Electronically Signed   By: Lowella Grip III M.D.   On: 08/12/2017 11:25  I personally (independently) visualized and performed the interpretation of the images attached in this note.     Assessment and Plan: 72 y.o. female with cough likely postviral.  Patient's story is concerning for second sickening but no pneumonia seen on chest x-ray.  Vital signs are reassuring.  Plan for watchful waiting with cough suppression medications.  Use Tessalon Perles as well as over-the-counter medications.  We discussed warning signs or symptoms.  Recheck as needed. The productive cough is new with uncertain prognosis.  No orders of the defined types were placed in this encounter.  Meds ordered this encounter  Medications  . benzonatate (TESSALON) 200 MG capsule    Sig: Take 1 capsule (200 mg total) by mouth 3 (three) times daily as needed for cough.    Dispense:  45 capsule    Refill:  0     Discussed warning signs or symptoms. Please see discharge instructions. Patient expresses understanding.

## 2017-08-12 NOTE — Patient Instructions (Addendum)
Thank you for coming in today. I recommend over the counter muccinex (guifinacine).  You can also use over the counter DM  Use tessalon pearles as needed.  Next up is codeine or hydrocodone cough medicine.   We will continue to follow.  Let me know if worse.   I will get xray results to you when I have them.   Call or go to the emergency room if you get worse, have trouble breathing, have chest pains, or palpitations.    Cough, Adult A cough helps to clear your throat and lungs. A cough may last only 2-3 weeks (acute), or it may last longer than 8 weeks (chronic). Many different things can cause a cough. A cough may be a sign of an illness or another medical condition. Follow these instructions at home:  Pay attention to any changes in your cough.  Take medicines only as told by your doctor. ? If you were prescribed an antibiotic medicine, take it as told by your doctor. Do not stop taking it even if you start to feel better. ? Talk with your doctor before you try using a cough medicine.  Drink enough fluid to keep your pee (urine) clear or pale yellow.  If the air is dry, use a cold steam vaporizer or humidifier in your home.  Stay away from things that make you cough at work or at home.  If your cough is worse at night, try using extra pillows to raise your head up higher while you sleep.  Do not smoke, and try not to be around smoke. If you need help quitting, ask your doctor.  Do not have caffeine.  Do not drink alcohol.  Rest as needed. Contact a doctor if:  You have new problems (symptoms).  You cough up yellow fluid (pus).  Your cough does not get better after 2-3 weeks, or your cough gets worse.  Medicine does not help your cough and you are not sleeping well.  You have pain that gets worse or pain that is not helped with medicine.  You have a fever.  You are losing weight and you do not know why.  You have night sweats. Get help right away if:  You  cough up blood.  You have trouble breathing.  Your heartbeat is very fast. This information is not intended to replace advice given to you by your health care provider. Make sure you discuss any questions you have with your health care provider. Document Released: 02/14/2011 Document Revised: 11/09/2015 Document Reviewed: 08/10/2014 Elsevier Interactive Patient Education  [employer].

## 2017-08-15 DIAGNOSIS — K581 Irritable bowel syndrome with constipation: Secondary | ICD-10-CM | POA: Diagnosis not present

## 2017-08-15 DIAGNOSIS — I10 Essential (primary) hypertension: Secondary | ICD-10-CM | POA: Diagnosis not present

## 2017-08-25 ENCOUNTER — Ambulatory Visit (INDEPENDENT_AMBULATORY_CARE_PROVIDER_SITE_OTHER): Payer: Medicare Other | Admitting: Family Medicine

## 2017-08-25 ENCOUNTER — Encounter: Payer: Self-pay | Admitting: Family Medicine

## 2017-08-25 VITALS — BP 105/67 | HR 67 | Temp 98.0°F | Wt 149.0 lb

## 2017-08-25 DIAGNOSIS — I739 Peripheral vascular disease, unspecified: Secondary | ICD-10-CM

## 2017-08-25 DIAGNOSIS — R070 Pain in throat: Secondary | ICD-10-CM

## 2017-08-25 DIAGNOSIS — R490 Dysphonia: Secondary | ICD-10-CM | POA: Diagnosis not present

## 2017-08-25 DIAGNOSIS — N183 Chronic kidney disease, stage 3 unspecified: Secondary | ICD-10-CM

## 2017-08-25 LAB — COMPLETE METABOLIC PANEL WITH GFR
AG Ratio: 1.4 (calc) (ref 1.0–2.5)
ALKALINE PHOSPHATASE (APISO): 54 U/L (ref 33–130)
ALT: 15 U/L (ref 6–29)
AST: 20 U/L (ref 10–35)
Albumin: 4 g/dL (ref 3.6–5.1)
BILIRUBIN TOTAL: 0.5 mg/dL (ref 0.2–1.2)
BUN / CREAT RATIO: 11 (calc) (ref 6–22)
BUN: 12 mg/dL (ref 7–25)
CHLORIDE: 102 mmol/L (ref 98–110)
CO2: 30 mmol/L (ref 20–32)
CREATININE: 1.11 mg/dL — AB (ref 0.60–0.93)
Calcium: 8.6 mg/dL (ref 8.6–10.4)
GFR, Est African American: 58 mL/min/{1.73_m2} — ABNORMAL LOW (ref >=60)
GFR, Est Non African American: 50 mL/min/{1.73_m2} — ABNORMAL LOW (ref >=60)
GLOBULIN: 2.9 g/dL (ref 1.9–3.7)
Glucose, Bld: 109 mg/dL — ABNORMAL HIGH (ref 65–99)
POTASSIUM: 4.5 mmol/L (ref 3.5–5.3)
SODIUM: 138 mmol/L (ref 135–146)
Total Protein: 6.9 g/dL (ref 6.1–8.1)

## 2017-08-25 MED ORDER — CLOTRIMAZOLE 10 MG MT TROC: 10.0000 mg | Freq: Every day | OROMUCOSAL | 0 refills | Status: DC

## 2017-08-25 NOTE — Patient Instructions (Signed)
Thank you for coming in today. You should hear from ENT soon.  Let me know if you do not hear anything.  Use the clotimazole for yeast for 1 week.    Hoarseness Hoarseness is any abnormal change in your voice.Hoarseness can make it difficult to speak. Your voice may sound raspy, breathy, or strained. Hoarseness is caused by a problem with the vocal cords. The vocal cords are two bands of tissue inside your voice box (larynx). When you speak, your vocal cords move back and forth to create sound. The surfaces of your vocal cords need to be smooth for your voice to sound clear. Swelling or lumps on the vocal cords can cause hoarseness. Common causes of vocal cord problems include:  Upper airway infection.  A long-term cough.  Straining or overusing your voice.  Smoking.  Allergies.  Vocal cord growths.  Stomach acids that flow up from your stomach and irritate your vocal cords (gastroesophageal reflux).  Follow these instructions at home: Watch your condition for any changes. To ease any discomfort that you feel:  Rest your voice. Do not whisper. Whispering can cause muscle strain.  Do not speak in a loud or harsh voice that makes your hoarseness worse.  Do not use any tobacco products, including cigarettes, chewing tobacco, or electronic cigarettes. If you need help quitting, ask your health care provider.  Avoid secondhand smoke.  Do not eat foods that give you heartburn. Heartburn can make gastroesophageal reflux worse.  Do not drink coffee.  Do not drink alcohol.  Drink enough fluids to keep your urine clear or pale yellow.  Use a humidifier if the air in your home is dry.  Contact a health care provider if:  You have hoarseness that lasts longer than 3 weeks.  You almost lose or completelylose your voice for longer than 3 days.  You have pain when you swallow or try to talk.  You feel a lump in your neck. Get help right away if:  You have trouble  swallowing.  You feel as though you are choking when you swallow.  You cough up blood or vomit blood.  You have trouble breathing. This information is not intended to replace advice given to you by your health care provider. Make sure you discuss any questions you have with your health care provider. Document Released: 05/17/2005 Document Revised: 11/09/2015 Document Reviewed: 05/25/2014 Elsevier Interactive Patient Education  [employer].

## 2017-08-25 NOTE — Progress Notes (Signed)
Traci [name] is a 72 y.o. female who presents to [facility]: [facility] today for sore throat and hoarseness.  Traci [name] has been seen multiple times over the last month and a half for several different illnesses including was thought to be scarlet fever, influenza, and a separate viral illness.  She had cough and hoarse voice which has persisted.  She notes the cough is resolved but her hoarseness has not improved and she also notes a significant sore throat.  She denies any trouble swallowing.  No significant shortness of breath fevers or chills.  As part of her workup in February she had a metabolic panel which showed mildly elevated creatinine above her typical baseline.  Past Medical History:  Diagnosis Date  . Anxiety   . Atrial fibrillation ([place])   . CAD (coronary artery disease)   . Claudication ([place]) 01/06/2008   Lower extremity dopplers - no evidence of arterial insufficiency, normal exam  . Coronary artery disease   . GERD (gastroesophageal reflux disease)   . Hyperlipidemia   . Hypertension 11/30/2010   echo- EF 55%; normal w/ mildly sclerotic aortic valve  . Hypertension 11/05/11   renal dopplers - celiac artery and SMA >50% diameter reduction, R renal artery - mildly elevated velocities 1-59% diameter reduction, L renal artery normal  . Nonspecific ST-T wave electrocardiographic changes 03/26/2011   R/Lmv - EF 74%, normal perfusion all regions, ST depression w/ Lexiscan infusion w/o assoc angina  . Osteopenia   . PAD (peripheral artery disease) ([place])   . Peripheral vascular disease ([place])   . PVD (peripheral vascular disease) ([place]) 11/05/2011   doppler - R/L brachial pressures essentially equal w/o inflow disease; L sublclavian/CCA bypass graft demonstrates patent flow, no evidence of significant stenosis  . Sigmoid diverticulitis    Past Surgical History:    Procedure Laterality Date  . ABDOMINAL AORTIC ANEURYSM REPAIR N/A 01/27/2014   Procedure: AORTO-SUPERIOR MESENTERIC ARTERY BYPASS GRAFT;  Surgeon: Angelia Mould, MD;  Location: [facility];  Service: Vascular;  Laterality: N/A;  . ABDOMINAL HYSTERECTOMY    . APPENDECTOMY    . CARDIAC CATHETERIZATION  06/03/2008   60% LAD, involving D2, borderline significant by IVUS, medical therapy. CFX, RCA OK.  . CHOLECYSTECTOMY    . EYE SURGERY     retinal surgery  . SPINE SURGERY  Feb. 2011  . [place]   X's  several  . SUBCLAVIAN ARTERY STENT Left 09/20/2008   LSA ISR 7x50mm Cordis Genesis on Opta premount, reduction from 80% to 0%  . subclavian artery stents  01/23/2010   Left carotid to subclavian artery Bypass  . VISCERAL ANGIOGRAM  01/17/2014   Procedure: VISCERAL ANGIOGRAM;  Surgeon: Angelia Mould, MD;  Location[facility] CATH LAB;  Service: Cardiovascular;;   Social History   Tobacco Use  . Smoking status: Former Smoker    Packs/day: 0.25    Years: 20.00    Pack years: 5.00    Types: Cigarettes    Last attempt to quit: 09/30/1993    Years since quitting: 23.9  . Smokeless tobacco: Never Used  Substance Use Topics  . Alcohol use: No   family history includes Diabetes in her maternal grandmother; Heart attack in her father; Heart disease in her paternal grandfather; Heart disease (age of onset: 8) in her father; Hyperlipidemia in her father; Hypertension in her father; Kidney disease in her father; Kidney failure in her mother.  ROS as above:  Medications: Current Outpatient Medications  Medication Sig Dispense Refill  . aspirin EC 81 MG tablet Take 81 mg by mouth daily.    Marland Kitchen atenolol (TENORMIN) 25 MG tablet Take 1 tablet (25 mg total) by mouth daily. 90 tablet 3  . atorvastatin (LIPITOR) 10 MG tablet Take 1 tablet (10 mg total) daily at 6 PM by mouth. 90 tablet 3  . benzonatate (TESSALON) 200 MG capsule Take 1 capsule (200 mg total) by mouth 3 (three) times  daily as needed for cough. 45 capsule 0  . candesartan (ATACAND) 16 MG tablet Take 0.5 tablets (8 mg total) daily by mouth. 45 tablet 3  . cholecalciferol (VITAMIN D) 1000 UNITS tablet Take 1,000 Units by mouth daily.    . clopidogrel (PLAVIX) 75 MG tablet Take 1 tablet (75 mg total) daily by mouth. 90 tablet 3  . clorazepate (TRANXENE) 7.5 MG tablet Take 1 tablet (7.5 mg total) by mouth daily as needed. 90 tablet 1  . cyanocobalamin 1000 MCG tablet Take 100 mcg by mouth daily.    Marland Kitchen EPINEPHrine (EPIPEN 2-PAK) 0.3 mg/0.3 mL IJ SOAJ injection Inject 0.3 mLs (0.3 mg total) into the muscle as needed (for allergic reaction). 2 Device 0  . esomeprazole (NEXIUM) 40 MG capsule Take 1 capsule (40 mg total) by mouth daily. Take 1 tab daily (Patient taking differently: Take 40 mg by mouth daily. ) 90 capsule 0  . estradiol (ESTRACE) 0.5 MG tablet TAKE 1 TABLET DAILY 90 tablet 1  . furosemide (LASIX) 20 MG tablet TAKE 1 TABLET EVERY OTHER DAY 45 tablet 2  . linaclotide (LINZESS) 145 MCG CAPS capsule Take 1 capsule by mouth daily as needed.    . meclizine (ANTIVERT) 25 MG tablet TAKE ONE-HALF (1/2) TABLET THREE TIMES A DAY AS NEEDED FOR DIZZINESS OR NAUSEA 90 tablet 1  . meloxicam (MOBIC) 15 MG tablet TAKE 1 TABLET BY MOUTH DAILY 30 tablet 0  . Multiple Vitamin (MULTIVITAMIN WITH MINERALS) TABS Take 1 tablet by mouth daily.    Marland Kitchen oseltamivir (TAMIFLU) 30 MG capsule Take 1 capsule (30 mg total) by mouth 2 (two) times daily. 10 capsule 0  . polyethylene glycol (MIRALAX / GLYCOLAX) packet Take 17 g by mouth 2 (two) times daily. Until stooling regularly (Patient taking differently: Take 17 g by mouth as needed. Until stooling regularly) 30 packet 11  . zolpidem (AMBIEN) 10 MG tablet Take 1 tablet (10 mg total) by mouth at bedtime as needed for sleep. 90 tablet 0  . clotrimazole (MYCELEX) 10 MG troche Take 1 tablet (10 mg total) by mouth 5 (five) times daily. 70 tablet 0   No current facility-administered  medications for this visit.    Allergies  Allergen Reactions  . Cortisone Anaphylaxis  . Dilaudid [Hydromorphone Hcl] Nausea And Vomiting    Pt states she will start vomiting immediately for 6 hours  . Iodine Anaphylaxis  . Medrol [Methylprednisolone] Anaphylaxis  . Omnipaque [Iohexol] Anaphylaxis  . Prednisone Anaphylaxis  . Shellfish Allergy Anaphylaxis  . Sulfa Drugs Cross Reactors Anaphylaxis  . Augmentin [Amoxicillin-Pot Clavulanate] Nausea Only    Upset stomach (Has taken cephalexin in the past without problems)  . Codeine Rash  . Erythromycin Rash  . Morphine And Related Nausea Only    nausea    Health Maintenance Health Maintenance  Topic Date Due  . MAMMOGRAM  11/30/2018  . TETANUS/TDAP  06/17/2020  . COLONOSCOPY  07/16/2025  . INFLUENZA VACCINE  Completed  . DEXA  SCAN  Completed  . Hepatitis C Screening  Completed  . PNA vac Low Risk Adult  Completed     Exam:  BP 105/67   Pulse 67   Temp 98 F (36.7 C) (Oral)   Wt 149 lb (67.6 kg)   BMI 25.58 kg/m  Gen: Well NAD HEENT: EOMI,  MMM voice.  Posterior pharynx with cobblestoning.  No cervical lymphadenopathy or masses palpated Lungs: Normal work of breathing. CTABL Heart: RRR no MRG Abd: NABS, Soft. Nondistended, Nontender Exts: Brisk capillary refill, warm and well perfused.      Chemistry      Component Value Date/Time   NA 140 07/21/2017 1215   NA 144 04/07/2013 0857   K 4.5 07/21/2017 1215   CL 102 07/21/2017 1215   CO2 30 07/21/2017 1215   BUN 12 07/21/2017 1215   BUN 10 04/07/2013 0857   CREATININE 1.08 (H) 07/21/2017 1215      Component Value Date/Time   CALCIUM 9.2 07/21/2017 1215   ALKPHOS 55 01/21/2017 1456   AST 26 07/21/2017 1215   ALT 19 07/21/2017 1215   BILITOT 0.5 07/21/2017 1215      CLINICAL DATA:  Cough  EXAM: CHEST  2 VIEW  COMPARISON:  March 03, 2017  FINDINGS: Lungs are slightly hyperexpanded. There is mild scarring in the left base. There is no edema  or consolidation. Heart size and pulmonary vascularity are normal. No adenopathy. There is aortic atherosclerosis. There is a left subclavian stent. There is postoperative change in the left axillary region. No evident bone lesions.  IMPRESSION: Mild scarring left base. Lungs mildly hyperexpanded. No edema or consolidation. Stable cardiac silhouette. There is aortic atherosclerosis. Left subclavian stent present.  Aortic Atherosclerosis (ICD10-I70.0).   Electronically Signed   By: Lowella Grip III M.D.   On: 08/12/2017 11:25 I personally (independently) visualized and performed the interpretation of the images attached in this note.   Assessment and Plan: 72 y.o. female with hoarse voice and soreness.  Persistent times 6 weeks unclear etiology.  Medical history significant for multiple peripheral arterial disease carotid artery disease etc. plan for at this point referral to ENT for likely laryngoscope.  Will try treating for possible candidiasis with clotrimazole.  Patient intolerant to steroids therefore not used at this point.  Will recheck metabolic panel to follow-up mildly elevated creatinine.   Orders Placed This Encounter  Procedures  . COMPLETE METABOLIC PANEL WITH GFR  . Ambulatory referral to ENT    Referral Priority:   Routine    Referral Type:   Consultation    Referral Reason:   Specialty Services Required    Requested Specialty:   Otolaryngology    Number of Visits Requested:   1   Meds ordered this encounter  Medications  . clotrimazole (MYCELEX) 10 MG troche    Sig: Take 1 tablet (10 mg total) by mouth 5 (five) times daily.    Dispense:  70 tablet    Refill:  0     Discussed warning signs or symptoms. Please see discharge instructions. Patient expresses understanding.

## 2017-09-10 ENCOUNTER — Ambulatory Visit (HOSPITAL_COMMUNITY)
Admission: RE | Admit: 2017-09-10 | Discharge: 2017-09-10 | Disposition: A | Discharge status: Home / Self Care | Payer: Medicare Other | Source: Ambulatory Visit | Attending: Vascular Surgery | Admitting: Vascular Surgery

## 2017-09-10 ENCOUNTER — Encounter: Payer: Self-pay | Admitting: Vascular Surgery

## 2017-09-10 ENCOUNTER — Ambulatory Visit (INDEPENDENT_AMBULATORY_CARE_PROVIDER_SITE_OTHER): Payer: Medicare Other | Admitting: Vascular Surgery

## 2017-09-10 VITALS — BP 137/83 | HR 62 | Temp 97.2°F | Resp 18 | Ht 64.0 in | Wt 148.0 lb

## 2017-09-10 DIAGNOSIS — I6523 Occlusion and stenosis of bilateral carotid arteries: Secondary | ICD-10-CM | POA: Diagnosis not present

## 2017-09-10 DIAGNOSIS — E785 Hyperlipidemia, unspecified: Secondary | ICD-10-CM | POA: Insufficient documentation

## 2017-09-10 DIAGNOSIS — Z48812 Encounter for surgical aftercare following surgery on the circulatory system: Secondary | ICD-10-CM

## 2017-09-10 DIAGNOSIS — I771 Stricture of artery: Secondary | ICD-10-CM | POA: Insufficient documentation

## 2017-09-10 DIAGNOSIS — I6529 Occlusion and stenosis of unspecified carotid artery: Secondary | ICD-10-CM

## 2017-09-10 DIAGNOSIS — I10 Essential (primary) hypertension: Secondary | ICD-10-CM | POA: Diagnosis not present

## 2017-09-10 NOTE — Progress Notes (Signed)
Patient name: Traci [name] MRN: [id number] DOB: 11/10/1945 Sex: female  REASON FOR VISIT:   Follow-up after SMA bypass and previous left carotid to subclavian bypass.  HPI:   Traci [name] is a pleasant 72 y.o. female who comes in for a yearly follow-up visit.  In the past she had a left carotid subclavian bypass.  Most recently, in 2015 she had an aorto to SMA bypass.  The proximal anastomosis was to the infrarenal aorta.  Since I saw her last a year ago she does occasionally have some dizziness.  This is been a chronic problem.  She also occasionally describes some abdominal pain although this is somewhat vague.  She states that she does not have much of an appetite and feels up very easily.  She has had no weight loss so I do not suspect that she has mesenteric ischemia.  She denies any focal weakness or paresthesias in her upper extremities or lower extremities.  Past Medical History:  Diagnosis Date  . Anxiety   . Atrial fibrillation ([place])   . CAD (coronary artery disease)   . Claudication ([place]) 01/06/2008   Lower extremity dopplers - no evidence of arterial insufficiency, normal exam  . Coronary artery disease   . GERD (gastroesophageal reflux disease)   . Hyperlipidemia   . Hypertension 11/30/2010   echo- EF 55%; normal w/ mildly sclerotic aortic valve  . Hypertension 11/05/11   renal dopplers - celiac artery and SMA >50% diameter reduction, R renal artery - mildly elevated velocities 1-59% diameter reduction, L renal artery normal  . Nonspecific ST-T wave electrocardiographic changes 03/26/2011   R/Lmv - EF 74%, normal perfusion all regions, ST depression w/ Lexiscan infusion w/o assoc angina  . Osteopenia   . PAD (peripheral artery disease) ([place])   . Peripheral vascular disease ([place])   . PVD (peripheral vascular disease) ([place]) 11/05/2011   doppler - R/L brachial pressures essentially equal w/o inflow disease; L sublclavian/CCA bypass graft demonstrates  patent flow, no evidence of significant stenosis  . Sigmoid diverticulitis     Family History  Problem Relation Age of Onset  . Heart disease Father 27       Heart Disease before age 51  . Kidney disease Father   . Heart attack Father   . Hyperlipidemia Father   . Hypertension Father   . Kidney failure Mother   . Diabetes Maternal Grandmother   . Heart disease Paternal Grandfather     SOCIAL HISTORY: Social History   Tobacco Use  . Smoking status: Former Smoker    Packs/day: 0.25    Years: 20.00    Pack years: 5.00    Types: Cigarettes    Last attempt to quit: 09/30/1993    Years since quitting: 23.9  . Smokeless tobacco: Never Used  Substance Use Topics  . Alcohol use: No    Allergies  Allergen Reactions  . Cortisone Anaphylaxis  . Dilaudid [Hydromorphone Hcl] Nausea And Vomiting    Pt states she will start vomiting immediately for 6 hours  . Iodine Anaphylaxis  . Medrol [Methylprednisolone] Anaphylaxis  . Omnipaque [Iohexol] Anaphylaxis  . Prednisone Anaphylaxis  . Shellfish Allergy Anaphylaxis  . Sulfa Drugs Cross Reactors Anaphylaxis  . Augmentin [Amoxicillin-Pot Clavulanate] Nausea Only    Upset stomach (Has taken cephalexin in the past without problems)  . Codeine Rash  . Erythromycin Rash  . Morphine And Related Nausea Only    nausea    Current Outpatient Medications  Medication Sig  Dispense Refill  . aspirin EC 81 MG tablet Take 81 mg by mouth daily.    Marland Kitchen atenolol (TENORMIN) 25 MG tablet Take 1 tablet (25 mg total) by mouth daily. 90 tablet 3  . atorvastatin (LIPITOR) 10 MG tablet Take 1 tablet (10 mg total) daily at 6 PM by mouth. 90 tablet 3  . benzonatate (TESSALON) 200 MG capsule Take 1 capsule (200 mg total) by mouth 3 (three) times daily as needed for cough. 45 capsule 0  . candesartan (ATACAND) 16 MG tablet Take 0.5 tablets (8 mg total) daily by mouth. 45 tablet 3  . cholecalciferol (VITAMIN D) 1000 UNITS tablet Take 1,000 Units by mouth  daily.    . clopidogrel (PLAVIX) 75 MG tablet Take 1 tablet (75 mg total) daily by mouth. 90 tablet 3  . clorazepate (TRANXENE) 7.5 MG tablet Take 1 tablet (7.5 mg total) by mouth daily as needed. 90 tablet 1  . clotrimazole (MYCELEX) 10 MG troche Take 1 tablet (10 mg total) by mouth 5 (five) times daily. 70 tablet 0  . cyanocobalamin 1000 MCG tablet Take 100 mcg by mouth daily.    Marland Kitchen EPINEPHrine (EPIPEN 2-PAK) 0.3 mg/0.3 mL IJ SOAJ injection Inject 0.3 mLs (0.3 mg total) into the muscle as needed (for allergic reaction). 2 Device 0  . esomeprazole (NEXIUM) 40 MG capsule Take 1 capsule (40 mg total) by mouth daily. Take 1 tab daily (Patient taking differently: Take 40 mg by mouth daily. ) 90 capsule 0  . estradiol (ESTRACE) 0.5 MG tablet TAKE 1 TABLET DAILY 90 tablet 1  . furosemide (LASIX) 20 MG tablet TAKE 1 TABLET EVERY OTHER DAY 45 tablet 2  . linaclotide (LINZESS) 145 MCG CAPS capsule Take 1 capsule by mouth daily as needed.    . meclizine (ANTIVERT) 25 MG tablet TAKE ONE-HALF (1/2) TABLET THREE TIMES A DAY AS NEEDED FOR DIZZINESS OR NAUSEA 90 tablet 1  . meloxicam (MOBIC) 15 MG tablet TAKE 1 TABLET BY MOUTH DAILY 30 tablet 0  . Multiple Vitamin (MULTIVITAMIN WITH MINERALS) TABS Take 1 tablet by mouth daily.    Marland Kitchen oseltamivir (TAMIFLU) 30 MG capsule Take 1 capsule (30 mg total) by mouth 2 (two) times daily. 10 capsule 0  . polyethylene glycol (MIRALAX / GLYCOLAX) packet Take 17 g by mouth 2 (two) times daily. Until stooling regularly (Patient taking differently: Take 17 g by mouth as needed. Until stooling regularly) 30 packet 11  . zolpidem (AMBIEN) 10 MG tablet Take 1 tablet (10 mg total) by mouth at bedtime as needed for sleep. 90 tablet 0   No current facility-administered medications for this visit.     REVIEW OF SYSTEMS:  [X]  denotes positive finding, [ ]  denotes negative finding Cardiac  Comments:  Chest pain or chest pressure:    Shortness of breath upon exertion:    Short of  breath when lying flat:    Irregular heart rhythm:        Vascular    Pain in calf, thigh, or hip brought on by ambulation:    Pain in feet at night that wakes you up from your sleep:     Blood clot in your veins:    Leg swelling:         Pulmonary    Oxygen at home:    Productive cough:     Wheezing:         Neurologic    Sudden weakness in arms or legs:     Sudden  numbness in arms or legs:     Sudden onset of difficulty speaking or slurred speech:    Temporary loss of vision in one eye:     Problems with dizziness:         Gastrointestinal    Blood in stool:     Vomited blood:         Genitourinary    Burning when urinating:     Blood in urine:        Psychiatric    Major depression:         Hematologic    Bleeding problems:    Problems with blood clotting too easily:        Skin    Rashes or ulcers:        Constitutional    Fever or chills:     PHYSICAL EXAM:   There were no vitals filed for this visit.  GENERAL: The patient is a well-nourished female, in no acute distress. The vital signs are documented above. CARDIAC: There is a regular rate and rhythm.  VASCULAR: She has a right carotid bruit. She has palpable radial pulses bilaterally. She has palpable pedal pulses bilaterally. She has no significant lower extremity swelling. PULMONARY: There is good air exchange bilaterally without wheezing or rales. ABDOMEN: Soft and non-tender with normal pitched bowel sounds.  MUSCULOSKELETAL: There are no major deformities or cyanosis. NEUROLOGIC: No focal weakness or paresthesias are detected. SKIN: There are no ulcers or rashes noted. PSYCHIATRIC: The patient has a normal affect.  DATA:    CAROTID DUPLEX: I have independently interpreted her carotid duplex scan today.  On the right side there is a less than 39% stenosis.  On the left side there is a less than 39% stenosis.  The left carotid to subclavian bypass is widely patent.  MEDICAL ISSUES:    STATUS POST LEFT CAROTID SUBCLAVIAN BYPASS: Her left carotid splenium bypass graft is patent.  She has a palpable radial pulse.  I do not think that her occasional issues with dizziness are related to vertebrobasilar symptoms given that her duplex shows a patent bypass graft with good pulses bilaterally.  I have ordered a follow-up carotid duplex scan in 1 year and I will see her back at that time.  She knows to call sooner if she has problems.  She is not a smoker.  She is on aspirin, Plavix, and a statin.  STATUS POST AORTO SMA BYPASS: She does have some digestive issues but I do not think this represents postprandial abdominal pain or evidence of mesenteric ischemia.  She has had no weight loss.   [name] [facility] [phone]

## 2017-09-26 DIAGNOSIS — H1132 Conjunctival hemorrhage, left eye: Secondary | ICD-10-CM | POA: Diagnosis not present

## 2017-10-16 ENCOUNTER — Encounter: Payer: Self-pay | Admitting: Family Medicine

## 2017-10-16 DIAGNOSIS — Z1239 Encounter for other screening for malignant neoplasm of breast: Secondary | ICD-10-CM

## 2017-10-30 ENCOUNTER — Telehealth: Payer: Self-pay

## 2017-10-30 ENCOUNTER — Encounter: Payer: Self-pay | Admitting: Family Medicine

## 2017-10-30 ENCOUNTER — Ambulatory Visit (INDEPENDENT_AMBULATORY_CARE_PROVIDER_SITE_OTHER): Payer: Medicare Other

## 2017-10-30 ENCOUNTER — Ambulatory Visit (INDEPENDENT_AMBULATORY_CARE_PROVIDER_SITE_OTHER): Payer: Medicare Other | Admitting: Family Medicine

## 2017-10-30 VITALS — BP 126/53 | HR 62 | Temp 98.2°F | Ht 64.0 in | Wt 149.0 lb

## 2017-10-30 DIAGNOSIS — R079 Chest pain, unspecified: Secondary | ICD-10-CM | POA: Diagnosis not present

## 2017-10-30 DIAGNOSIS — I771 Stricture of artery: Secondary | ICD-10-CM

## 2017-10-30 DIAGNOSIS — K551 Chronic vascular disorders of intestine: Secondary | ICD-10-CM

## 2017-10-30 DIAGNOSIS — I2510 Atherosclerotic heart disease of native coronary artery without angina pectoris: Secondary | ICD-10-CM | POA: Diagnosis not present

## 2017-10-30 DIAGNOSIS — I739 Peripheral vascular disease, unspecified: Secondary | ICD-10-CM

## 2017-10-30 DIAGNOSIS — R05 Cough: Secondary | ICD-10-CM

## 2017-10-30 DIAGNOSIS — R059 Cough, unspecified: Secondary | ICD-10-CM

## 2017-10-30 LAB — CBC WITH DIFFERENTIAL/PLATELET
BASOS PCT: 1.2 %
Basophils Absolute: 62 cells/uL (ref 0–200)
EOS ABS: 281 {cells}/uL (ref 15–500)
EOS PCT: 5.4 %
HCT: 36.1 % (ref 35.0–45.0)
HEMOGLOBIN: 12.4 g/dL (ref 11.7–15.5)
Lymphs Abs: 806 cells/uL — ABNORMAL LOW (ref 850–3900)
MCH: 29.7 pg (ref 27.0–33.0)
MCHC: 34.3 g/dL (ref 32.0–36.0)
MCV: 86.4 fL (ref 80.0–100.0)
MONOS PCT: 9.1 %
MPV: 9.2 fL (ref 7.5–12.5)
NEUTROS ABS: 3578 {cells}/uL (ref 1500–7800)
Neutrophils Relative %: 68.8 %
Platelets: 294 10*3/uL (ref 140–400)
RBC: 4.18 10*6/uL (ref 3.80–5.10)
RDW: 12.4 % (ref 11.0–15.0)
Total Lymphocyte: 15.5 %
WBC mixed population: 473 cells/uL (ref 200–950)
WBC: 5.2 10*3/uL (ref 3.8–10.8)

## 2017-10-30 LAB — COMPLETE METABOLIC PANEL WITH GFR
AG RATIO: 1.4 (calc) (ref 1.0–2.5)
ALT: 18 U/L (ref 6–29)
AST: 23 U/L (ref 10–35)
Albumin: 4 g/dL (ref 3.6–5.1)
Alkaline phosphatase (APISO): 61 U/L (ref 33–130)
BUN: 13 mg/dL (ref 7–25)
CHLORIDE: 104 mmol/L (ref 98–110)
CO2: 29 mmol/L (ref 20–32)
Calcium: 8.6 mg/dL (ref 8.6–10.4)
Creat: 0.9 mg/dL (ref 0.60–0.93)
GFR, EST NON AFRICAN AMERICAN: 64 mL/min/{1.73_m2} (ref >=60)
GFR, Est African American: 75 mL/min/{1.73_m2} (ref >=60)
GLUCOSE: 104 mg/dL — AB (ref 65–99)
Globulin: 2.9 g/dL (calc) (ref 1.9–3.7)
POTASSIUM: 4.7 mmol/L (ref 3.5–5.3)
Sodium: 140 mmol/L (ref 135–146)
Total Bilirubin: 0.6 mg/dL (ref 0.2–1.2)
Total Protein: 6.9 g/dL (ref 6.1–8.1)

## 2017-10-30 LAB — TROPONIN I: Troponin I: <0.01 ng/mL (ref <=0.0)

## 2017-10-30 MED ORDER — CEFDINIR 300 MG PO CAPS: 300.0000 mg | ORAL_CAPSULE | Freq: Two times a day (BID) | ORAL | 0 refills | Status: DC

## 2017-10-30 NOTE — Patient Instructions (Signed)
Thank you for coming in today. I think this is likely viral bronchitis.  Cough control is the main treatment.  If worse fill and take omnicef antibiotic.  Chest pain is likely related to the same virus.  However your heart risk is high enough that I am check troponin heart enzymes I will get results to you today.  If normal follow up with cardiology sooner than August.  Call or go to the emergency room if you get worse, have trouble breathing, have chest pains, or palpitations.    Acute Bronchitis, Adult Acute bronchitis is when air tubes (bronchi) in the lungs suddenly get swollen. The condition can make it hard to breathe. It can also cause these symptoms:  A cough.  Coughing up clear, yellow, or green mucus.  Wheezing.  Chest congestion.  Shortness of breath.  A fever.  Body aches.  Chills.  A sore throat.  Follow these instructions at home: Medicines  Take over-the-counter and prescription medicines only as told by your doctor.  If you were prescribed an antibiotic medicine, take it as told by your doctor. Do not stop taking the antibiotic even if you start to feel better. General instructions  Rest.  Drink enough fluids to keep your pee (urine) clear or pale yellow.  Avoid smoking and secondhand smoke. If you smoke and you need help quitting, ask your doctor. Quitting will help your lungs heal faster.  Use an inhaler, cool mist vaporizer, or humidifier as told by your doctor.  Keep all follow-up visits as told by your doctor. This is important. How is this prevented? To lower your risk of getting this condition again:  Wash your hands often with soap and water. If you cannot use soap and water, use hand sanitizer.  Avoid contact with people who have cold symptoms.  Try not to touch your hands to your mouth, nose, or eyes.  Make sure to get the flu shot every year.  Contact a doctor if:  Your symptoms do not get better in 2 weeks. Get help right away  if:  You cough up blood.  You have chest pain.  You have very bad shortness of breath.  You become dehydrated.  You faint (pass out) or keep feeling like you are going to pass out.  You keep throwing up (vomiting).  You have a very bad headache.  Your fever or chills gets worse. This information is not intended to replace advice given to you by your health care provider. Make sure you discuss any questions you have with your health care provider. Document Released: 11/20/2007 Document Revised: 01/10/2016 Document Reviewed: 11/22/2015 Elsevier Interactive Patient Education  [employer].

## 2017-10-30 NOTE — Progress Notes (Signed)
Traci [name] is a 72 y.o. female who presents to [place]: [place] today for cough and congestion.  Symptoms present for about 24 hours.  She notes that she had similar symptoms a few months ago but had resolution of symptoms until just recently.  She notes cough body aches and congestion.  She notes some mild pain across the top of her chest and into the left lateral chest wall.  She has the pain is typically worse with coughing and when she lays on her left side.  She notes occasionally she will have some pain with exertion.  She denies palpitations lightheadedness dizziness or syncope.  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  Patient notes that she is leaving town in 2 days to go on vacation at [employer] in [place] for about a week. ROS as above:  Exam:  BP (!) 126/53   Pulse 62   Temp 98.2 F (36.8 C) (Oral)   Ht 5\' 4"  (1.626 m)   Wt 149 lb (67.6 kg)   SpO2 100%   BMI 25.58 kg/m  Gen: Well NAD HEENT: EOMI,  MMM Lungs: Normal work of breathing. CTABL Heart: RRR no MRG Abd: NABS, Soft. Nondistended, Nontender Exts: Brisk capillary refill, warm and well perfused.   Lab and Radiology Results CXR: Two-view chest x-ray shows bronchitic pattern with no infiltrates or effusion or pneumothorax.  Awaiting formal radiology review.   12 lead EKG: NSR at 62 BPM.  No ST elevation of depression.  New mild Twave inversion at V3 and flattened Twaves at V6.  Mild change from EKG 2018.   Assessment and Plan: 72 y.o. female with  Cough and congestion associated with some mild chest pain.  This is very likely viral URI/bronchitis.  Plan for symptomatic treatment with cough suppression medications and watchful waiting.   Back-up Omnicef antibiotic prescription printed for use as needed if worsening.  As for the chest pain this is likely related to the viral URI however she does have multiple coronary artery disease risk equivalents.  Her EKG today is largely benign appearing but slightly changed from prior.  Traci [name] is going on vacation in [place] in a few days.  Plan for limited lab work-up including CBC and metabolic panel.  Will obtain stat troponin today to assess for safety.  Recommend follow-up with cardiology in the near future.  Precautions reviewed.   Orders Placed This Encounter  Procedures  . DG Chest 2 View    Order Specific Question:   Reason for exam:    Answer:   Cough, assess intra-thoracic pathology    Order Specific Question:   Preferred imaging location?    Answer:   [facility]  . COMPLETE METABOLIC PANEL WITH GFR  . CBC with Differential/Platelet  . Troponin I  . EKG 12-Lead   Meds ordered this encounter  Medications  . cefdinir (OMNICEF) 300 MG capsule    Sig: Take 1 capsule (300 mg total) by mouth 2 (two) times daily.    Dispense:  14 capsule    Refill:  0     Historical information moved to improve visibility of documentation.  Past Medical History:  Diagnosis Date  . Anxiety   . Atrial fibrillation ([place])   . CAD (coronary artery disease)   . Claudication ([place]) 01/06/2008   Lower extremity  dopplers - no evidence of arterial insufficiency, normal exam  . Coronary artery disease   . GERD (gastroesophageal reflux disease)   . Hyperlipidemia   . Hypertension 11/30/2010   echo- EF 55%; normal w/ mildly sclerotic aortic valve  . Hypertension 11/05/11   renal dopplers - celiac artery and SMA >50% diameter reduction, R renal artery - mildly elevated velocities 1-59% diameter reduction, L renal artery normal  . Nonspecific ST-T wave electrocardiographic changes 03/26/2011   R/Lmv - EF 74%, normal perfusion all regions, ST depression w/ Lexiscan infusion w/o assoc  angina  . Osteopenia   . PAD (peripheral artery disease) ([place])   . Peripheral vascular disease ([place])   . PVD (peripheral vascular disease) ([place]) 11/05/2011   doppler - R/L brachial pressures essentially equal w/o inflow disease; L sublclavian/CCA bypass graft demonstrates patent flow, no evidence of significant stenosis  . Sigmoid diverticulitis    Past Surgical History:  Procedure Laterality Date  . ABDOMINAL AORTIC ANEURYSM REPAIR N/A 01/27/2014   Procedure: AORTO-SUPERIOR MESENTERIC ARTERY BYPASS GRAFT;  Surgeon: Angelia Mould, MD;  Location: [facility];  Service: Vascular;  Laterality: N/A;  . ABDOMINAL HYSTERECTOMY    . APPENDECTOMY    . CARDIAC CATHETERIZATION  06/03/2008   60% LAD, involving D2, borderline significant by IVUS, medical therapy. CFX, RCA OK.  Marland Kitchen CATARACT EXTRACTION    . CHOLECYSTECTOMY    . EYE SURGERY     retinal surgery  . SPINE SURGERY  Feb. 2011  . [place]   X's  several  . SUBCLAVIAN ARTERY STENT Left 09/20/2008   LSA ISR 7x63mm Cordis Genesis on Opta premount, reduction from 80% to 0%  . subclavian artery stents  01/23/2010   Left carotid to subclavian artery Bypass  . VISCERAL ANGIOGRAM  01/17/2014   Procedure: VISCERAL ANGIOGRAM;  Surgeon: Angelia Mould, MD;  Location: [facility] CATH LAB;  Service: Cardiovascular;;   Social History   Tobacco Use  . Smoking status: Former Smoker    Packs/day: 0.25    Years: 20.00    Pack years: 5.00    Types: Cigarettes    Last attempt to quit: 09/30/1993    Years since quitting: 24.0  . Smokeless tobacco: Never Used  Substance Use Topics  . Alcohol use: No   family history includes Diabetes in her maternal grandmother; Heart attack in her father; Heart disease in her paternal grandfather; Heart disease (age of onset: 73) in her father; Hyperlipidemia in her father; Hypertension in her father; Kidney disease in her father; Kidney failure in her mother.  Medications: Current Outpatient  Medications  Medication Sig Dispense Refill  . aspirin EC 81 MG tablet Take 81 mg by mouth daily.    Marland Kitchen atenolol (TENORMIN) 25 MG tablet Take 1 tablet (25 mg total) by mouth daily. 90 tablet 3  . atorvastatin (LIPITOR) 10 MG tablet Take 1 tablet (10 mg total) daily at 6 PM by mouth. 90 tablet 3  . candesartan (ATACAND) 16 MG tablet Take 0.5 tablets (8 mg total) daily by mouth. 45 tablet 3  . cholecalciferol (VITAMIN D) 1000 UNITS tablet Take 1,000 Units by mouth daily.    . clopidogrel (PLAVIX) 75 MG tablet Take 1 tablet (75 mg total) daily by mouth. 90 tablet 3  . clorazepate (TRANXENE) 7.5 MG tablet Take 1 tablet (7.5 mg total) by mouth daily as needed. 90 tablet 1  . clotrimazole (MYCELEX) 10 MG troche Take 1 tablet (10 mg total) by mouth 5 (  five) times daily. 70 tablet 0  . cyanocobalamin 1000 MCG tablet Take 100 mcg by mouth daily.    Marland Kitchen EPINEPHrine (EPIPEN 2-PAK) 0.3 mg/0.3 mL IJ SOAJ injection Inject 0.3 mLs (0.3 mg total) into the muscle as needed (for allergic reaction). 2 Device 0  . esomeprazole (NEXIUM) 40 MG capsule Take 1 capsule (40 mg total) by mouth daily. Take 1 tab daily (Patient taking differently: Take 40 mg by mouth daily. ) 90 capsule 0  . estradiol (ESTRACE) 0.5 MG tablet TAKE 1 TABLET DAILY 90 tablet 1  . furosemide (LASIX) 20 MG tablet TAKE 1 TABLET EVERY OTHER DAY 45 tablet 2  . linaclotide (LINZESS) 145 MCG CAPS capsule Take 1 capsule by mouth daily as needed.    . meclizine (ANTIVERT) 25 MG tablet TAKE ONE-HALF (1/2) TABLET THREE TIMES A DAY AS NEEDED FOR DIZZINESS OR NAUSEA 90 tablet 1  . meloxicam (MOBIC) 15 MG tablet TAKE 1 TABLET BY MOUTH DAILY 30 tablet 0  . Multiple Vitamin (MULTIVITAMIN WITH MINERALS) TABS Take 1 tablet by mouth daily.    . polyethylene glycol (MIRALAX / GLYCOLAX) packet Take 17 g by mouth 2 (two) times daily. Until stooling regularly (Patient taking differently: Take 17 g by mouth as needed. Until stooling regularly) 30 packet 11  . zolpidem  (AMBIEN) 10 MG tablet Take 1 tablet (10 mg total) by mouth at bedtime as needed for sleep. 90 tablet 0  . cefdinir (OMNICEF) 300 MG capsule Take 1 capsule (300 mg total) by mouth 2 (two) times daily. 14 capsule 0   No current facility-administered medications for this visit.    Allergies  Allergen Reactions  . Cortisone Anaphylaxis  . Dilaudid [Hydromorphone Hcl] Nausea And Vomiting    Pt states she will start vomiting immediately for 6 hours  . Iodine Anaphylaxis  . Medrol [Methylprednisolone] Anaphylaxis  . Omnipaque [Iohexol] Anaphylaxis  . Prednisone Anaphylaxis  . Shellfish Allergy Anaphylaxis  . Sulfa Drugs Cross Reactors Anaphylaxis  . Augmentin [Amoxicillin-Pot Clavulanate] Nausea Only    Upset stomach (Has taken cephalexin in the past without problems)  . Codeine Rash  . Erythromycin Rash  . Morphine And Related Nausea Only    nausea    Health Maintenance Health Maintenance  Topic Date Due  . INFLUENZA VACCINE  01/15/2018  . MAMMOGRAM  11/30/2018  . TETANUS/TDAP  06/17/2020  . COLONOSCOPY  07/16/2025  . DEXA SCAN  Completed  . Hepatitis C Screening  Completed  . PNA vac Low Risk Adult  Completed    Discussed warning signs or symptoms. Please see discharge instructions. Patient expresses understanding.

## 2017-10-31 ENCOUNTER — Encounter: Payer: Self-pay | Admitting: Family Medicine

## 2017-10-31 ENCOUNTER — Telehealth: Payer: Self-pay

## 2017-10-31 ENCOUNTER — Telehealth: Payer: Self-pay | Admitting: Family Medicine

## 2017-10-31 NOTE — Telephone Encounter (Signed)
Results will be faxed over.Marland KitchenW.Sharlet Salina, [facility]

## 2017-10-31 NOTE — Telephone Encounter (Signed)
Call patient with preliminary results from [place].  Troponin looks negative.  Awaiting formal results from [place] labs.

## 2017-10-31 NOTE — Telephone Encounter (Signed)
Results are not available in Epic as of 5:30am 10/31/17. Please contact [facility] to see what is happening.

## 2017-10-31 NOTE — Telephone Encounter (Signed)
No additional encounter note

## 2017-10-31 NOTE — Telephone Encounter (Signed)
Yes they have called me several times...they are looking into it and will call me back. W.Shanielle Correll, CCMA

## 2017-10-31 NOTE — Telephone Encounter (Signed)
I got the fax it still not available in epic.  Have we investigated this?

## 2017-11-12 ENCOUNTER — Other Ambulatory Visit: Payer: Self-pay

## 2017-11-12 MED ORDER — CLORAZEPATE DIPOTASSIUM 7.5 MG PO TABS: 7.5000 mg | ORAL_TABLET | Freq: Every day | ORAL | 1 refills | Status: DC | PRN

## 2017-11-12 NOTE — Telephone Encounter (Signed)
Patient request refill for Tranxene 7.5 mg sent to [facility].  Please adviseRhonda [name],CMA

## 2017-11-19 ENCOUNTER — Telehealth: Payer: Self-pay | Admitting: Family Medicine

## 2017-11-19 NOTE — Telephone Encounter (Signed)
I called Traci [name] this morning to clarifying her pharmacy.  Medication should go to [facility] DOD

## 2017-12-03 ENCOUNTER — Ambulatory Visit: Payer: Medicare Other

## 2017-12-04 ENCOUNTER — Other Ambulatory Visit: Payer: Self-pay | Admitting: Family Medicine

## 2017-12-04 ENCOUNTER — Ambulatory Visit (INDEPENDENT_AMBULATORY_CARE_PROVIDER_SITE_OTHER): Payer: Medicare Other

## 2017-12-04 DIAGNOSIS — Z780 Asymptomatic menopausal state: Secondary | ICD-10-CM

## 2017-12-04 DIAGNOSIS — Z1239 Encounter for other screening for malignant neoplasm of breast: Secondary | ICD-10-CM

## 2017-12-04 DIAGNOSIS — Z1231 Encounter for screening mammogram for malignant neoplasm of breast: Secondary | ICD-10-CM

## 2017-12-09 DIAGNOSIS — H43391 Other vitreous opacities, right eye: Secondary | ICD-10-CM | POA: Diagnosis not present

## 2017-12-09 DIAGNOSIS — H35371 Puckering of macula, right eye: Secondary | ICD-10-CM | POA: Diagnosis not present

## 2017-12-09 DIAGNOSIS — H43813 Vitreous degeneration, bilateral: Secondary | ICD-10-CM | POA: Diagnosis not present

## 2017-12-09 DIAGNOSIS — H35431 Paving stone degeneration of retina, right eye: Secondary | ICD-10-CM | POA: Diagnosis not present

## 2017-12-17 ENCOUNTER — Ambulatory Visit (INDEPENDENT_AMBULATORY_CARE_PROVIDER_SITE_OTHER): Payer: Medicare Other

## 2017-12-17 DIAGNOSIS — Z780 Asymptomatic menopausal state: Secondary | ICD-10-CM | POA: Diagnosis not present

## 2017-12-17 DIAGNOSIS — M810 Age-related osteoporosis without current pathological fracture: Secondary | ICD-10-CM | POA: Diagnosis not present

## 2017-12-17 DIAGNOSIS — M85851 Other specified disorders of bone density and structure, right thigh: Secondary | ICD-10-CM | POA: Diagnosis not present

## 2017-12-20 ENCOUNTER — Other Ambulatory Visit: Payer: Self-pay | Admitting: Family Medicine

## 2017-12-22 ENCOUNTER — Other Ambulatory Visit: Payer: Self-pay | Admitting: Family Medicine

## 2017-12-22 DIAGNOSIS — Z95828 Presence of other vascular implants and grafts: Secondary | ICD-10-CM | POA: Diagnosis not present

## 2017-12-22 DIAGNOSIS — K55069 Acute infarction of intestine, part and extent unspecified: Secondary | ICD-10-CM | POA: Diagnosis not present

## 2017-12-22 DIAGNOSIS — K581 Irritable bowel syndrome with constipation: Secondary | ICD-10-CM | POA: Diagnosis not present

## 2017-12-22 DIAGNOSIS — K55059 Acute (reversible) ischemia of intestine, part and extent unspecified: Secondary | ICD-10-CM | POA: Diagnosis not present

## 2017-12-22 DIAGNOSIS — K551 Chronic vascular disorders of intestine: Secondary | ICD-10-CM | POA: Diagnosis not present

## 2017-12-22 NOTE — Telephone Encounter (Signed)
Please advise if ok to refill this Rx for Meclizine 25 mg. I did not see where patient had a Dx for dizziness or Nausea. Just want to be clear. [name],CMA

## 2017-12-23 ENCOUNTER — Other Ambulatory Visit: Payer: Self-pay

## 2017-12-23 MED ORDER — EPINEPHRINE 0.3 MG/0.3ML IJ SOAJ: 0.3000 mg | INTRAMUSCULAR | 0 refills | Status: AC | PRN

## 2017-12-23 NOTE — Telephone Encounter (Signed)
Patient request refill for Zolpidem tartrate 10 mg sent to [facility]. Please advise. Rhonda Cunningham,CMA

## 2017-12-25 ENCOUNTER — Encounter: Payer: Self-pay | Admitting: Family Medicine

## 2017-12-25 ENCOUNTER — Telehealth: Payer: Self-pay

## 2017-12-25 DIAGNOSIS — M810 Age-related osteoporosis without current pathological fracture: Secondary | ICD-10-CM | POA: Insufficient documentation

## 2017-12-25 MED ORDER — ZOLPIDEM TARTRATE 10 MG PO TABS: 10.0000 mg | ORAL_TABLET | Freq: Every evening | ORAL | 1 refills | Status: DC | PRN

## 2017-12-25 NOTE — Telephone Encounter (Signed)
Patient called stated that she cancelled her Rx for Zolpidem 10 at [facility] because it was supposed to [place]. Please send to [facility]. Rhonda Cunningham,CMA

## 2017-12-26 MED ORDER — ZOLPIDEM TARTRATE 10 MG PO TABS: 10.0000 mg | ORAL_TABLET | Freq: Every evening | ORAL | 1 refills | Status: DC | PRN

## 2017-12-26 NOTE — Telephone Encounter (Signed)
Spoke to patient advised that medication has been sent to [facility]. Rhonda Cunningham,CMA

## 2017-12-26 NOTE — Telephone Encounter (Signed)
Ambien sent.

## 2018-01-04 DIAGNOSIS — I700 Atherosclerosis of aorta: Secondary | ICD-10-CM | POA: Insufficient documentation

## 2018-01-04 NOTE — Progress Notes (Signed)
Patient ID: Traci [name], female   DOB: December 10, 1945, 72 y.o.   MRN: [id number]    Cardiology Office Note    Date:  01/06/2018   ID:  Traci [name], DOB [birth year], MRN [id number]  PCP:  Gregor Hams, MD  Cardiologist:   Sanda Klein, MD   No chief complaint on file.   History of Present Illness:  Traci [name] is a 72 y.o. female with an extensive history of peripheral arterial disease involving the mesenteric circulation and upper extremities, mild nonobstructive coronary atherosclerosis, hyperlipidemia, hypertension.  She has not had any cardiac problems since her last appointment.  She underwent bilateral cataract surgery.  She continues to dance 3 hours a night 3 times a week without any angina or dyspnea.  She does not have any problems with claudication in her left upper extremity or in her legs.  She denies focal neurological complaints.  She has occasional periods of abdominal pain.  These would last for a few days to as much as a month.  They improve only if she sticks to a bland diet for a long period of time.  Traci [name] has long-standing history of peripheral arterial disease with previous stents and surgical revascularization of the left subclavian artery for subclavian steal syndrome and presyncope. She eventually underwent a left carotid to subclavian bypass.  She underwent a bypass from the aorta to the chronically totally occluded superficial mesenteric artery due to symptoms of chronic mesenteric ischemia, (Dr. Scot Dock August 2015 6 mm Dacron graft) She had moderate CAD by coronary angiography performed in 2009. Normal left ventricular systolic function and no perfusion abnormalities by echo 2015 and nuclear stress test performed in 2014 and  October 2017. She had carotid duplex ultrasonography last performed in March 2019, without evidence of significant obstruction. She has a left carotid-subclavian bypass graft without evidence  of stenosis. The aortic SMA bypass was noted on abdominal CT performed in July 2018, although no comment was made about its patency. There was no evidence of aortic aneurysm and no comment made about the renal arteries.   Past Medical History:  Diagnosis Date  . Anxiety   . Atrial fibrillation ([place])   . CAD (coronary artery disease)   . Claudication ([place]) 01/06/2008   Lower extremity dopplers - no evidence of arterial insufficiency, normal exam  . Coronary artery disease   . GERD (gastroesophageal reflux disease)   . Hyperlipidemia   . Hypertension 11/30/2010   echo- EF 55%; normal w/ mildly sclerotic aortic valve  . Hypertension 11/05/11   renal dopplers - celiac artery and SMA >50% diameter reduction, R renal artery - mildly elevated velocities 1-59% diameter reduction, L renal artery normal  . Nonspecific ST-T wave electrocardiographic changes 03/26/2011   R/Lmv - EF 74%, normal perfusion all regions, ST depression w/ Lexiscan infusion w/o assoc angina  . Osteopenia   . PAD (peripheral artery disease) ([place])   . Peripheral vascular disease ([place])   . PVD (peripheral vascular disease) ([place]) 11/05/2011   doppler - R/L brachial pressures essentially equal w/o inflow disease; L sublclavian/CCA bypass graft demonstrates patent flow, no evidence of significant stenosis  . Sigmoid diverticulitis     Past Surgical History:  Procedure Laterality Date  . ABDOMINAL AORTIC ANEURYSM REPAIR N/A 01/27/2014   Procedure: AORTO-SUPERIOR MESENTERIC ARTERY BYPASS GRAFT;  Surgeon: Angelia Mould, MD;  Location: [facility];  Service: Vascular;  Laterality: N/A;  . ABDOMINAL HYSTERECTOMY    . APPENDECTOMY    .  CARDIAC CATHETERIZATION  06/03/2008   60% LAD, involving D2, borderline significant by IVUS, medical therapy. CFX, RCA OK.  Marland Kitchen CATARACT EXTRACTION    . CHOLECYSTECTOMY    . EYE SURGERY     retinal surgery  . SPINE SURGERY  Feb. 2011  . Scalp Level   X's  several  . SUBCLAVIAN  ARTERY STENT Left 09/20/2008   LSA ISR 7x32mm Cordis Genesis on Opta premount, reduction from 80% to 0%  . subclavian artery stents  01/23/2010   Left carotid to subclavian artery Bypass  . VISCERAL ANGIOGRAM  01/17/2014   Procedure: VISCERAL ANGIOGRAM;  Surgeon: Angelia Mould, MD;  Location: [facility] CATH LAB;  Service: Cardiovascular;;    Outpatient Medications Prior to Visit  Medication Sig Dispense Refill  . aspirin EC 81 MG tablet Take 81 mg by mouth daily.    Marland Kitchen atenolol (TENORMIN) 25 MG tablet Take 1 tablet (25 mg total) by mouth daily. 90 tablet 3  . atorvastatin (LIPITOR) 10 MG tablet Take 1 tablet (10 mg total) daily at 6 PM by mouth. 90 tablet 3  . candesartan (ATACAND) 16 MG tablet Take 0.5 tablets (8 mg total) daily by mouth. 45 tablet 3  . cholecalciferol (VITAMIN D) 1000 UNITS tablet Take 1,000 Units by mouth daily.    . clopidogrel (PLAVIX) 75 MG tablet Take 1 tablet (75 mg total) daily by mouth. 90 tablet 3  . clorazepate (TRANXENE) 7.5 MG tablet Take 1 tablet (7.5 mg total) by mouth daily as needed. 90 tablet 1  . cyanocobalamin 1000 MCG tablet Take 100 mcg by mouth daily.    Marland Kitchen EPINEPHrine (EPIPEN 2-PAK) 0.3 mg/0.3 mL IJ SOAJ injection Inject 0.3 mLs (0.3 mg total) into the muscle as needed (for allergic reaction). 2 Device 0  . esomeprazole (NEXIUM) 40 MG capsule Take 1 capsule (40 mg total) by mouth daily. Take 1 tab daily (Patient taking differently: Take 40 mg by mouth daily. ) 90 capsule 0  . estradiol (ESTRACE) 0.5 MG tablet TAKE 1 TABLET DAILY 90 tablet 1  . furosemide (LASIX) 20 MG tablet TAKE 1 TABLET EVERY OTHER DAY 45 tablet 2  . linaclotide (LINZESS) 145 MCG CAPS capsule Take 1 capsule by mouth daily as needed.    . meclizine (ANTIVERT) 25 MG tablet TAKE ONE-HALF (1/2) TABLET THREE TIMES A DAY AS NEEDED FOR DIZZINESS OR NAUSEA 90 tablet 1  . Multiple Vitamin (MULTIVITAMIN WITH MINERALS) TABS Take 1 tablet by mouth daily.    Marland Kitchen zolpidem (AMBIEN) 10 MG tablet Take 1  tablet (10 mg total) by mouth at bedtime as needed for sleep. 90 tablet 1  . cefdinir (OMNICEF) 300 MG capsule Take 1 capsule (300 mg total) by mouth 2 (two) times daily. 14 capsule 0  . clotrimazole (MYCELEX) 10 MG troche Take 1 tablet (10 mg total) by mouth 5 (five) times daily. 70 tablet 0  . meloxicam (MOBIC) 15 MG tablet TAKE 1 TABLET BY MOUTH DAILY 30 tablet 0  . polyethylene glycol (MIRALAX / GLYCOLAX) packet Take 17 g by mouth 2 (two) times daily. Until stooling regularly (Patient taking differently: Take 17 g by mouth as needed. Until stooling regularly) 30 packet 11   No facility-administered medications prior to visit.      Allergies:   Cortisone; Dilaudid [hydromorphone hcl]; Iodine; Medrol [methylprednisolone]; Omnipaque [iohexol]; Prednisone; Shellfish allergy; Sulfa drugs cross reactors; Augmentin [amoxicillin-pot clavulanate]; Codeine; Erythromycin; and Morphine and related   Social History   Socioeconomic History  .  Marital status: Legally Separated    Spouse name: Not on file  . Number of children: Not on file  . Years of education: Not on file  . Highest education level: Not on file  Occupational History  . Not on file  Social Needs  . Financial resource strain: Not on file  . Food insecurity:    Worry: Not on file    Inability: Not on file  . Transportation needs:    Medical: Not on file    Non-medical: Not on file  Tobacco Use  . Smoking status: Former Smoker    Packs/day: 0.25    Years: 20.00    Pack years: 5.00    Types: Cigarettes    Last attempt to quit: 09/30/1993    Years since quitting: 24.2  . Smokeless tobacco: Never Used  Substance and Sexual Activity  . Alcohol use: No  . Drug use: No  . Sexual activity: Never  Lifestyle  . Physical activity:    Days per week: Not on file    Minutes per session: Not on file  . Stress: Not on file  Relationships  . Social connections:    Talks on phone: Not on file    Gets together: Not on file     Attends religious service: Not on file    Active member of club or organization: Not on file    Attends meetings of clubs or organizations: Not on file    Relationship status: Not on file  Other Topics Concern  . Not on file  Social History Narrative  . Not on file     Family History:  The patient's family history includes Diabetes in her maternal grandmother; Heart attack in her father; Heart disease in her paternal grandfather; Heart disease (age of onset: 55) in her father; Hyperlipidemia in her father; Hypertension in her father; Kidney disease in her father; Kidney failure in her mother.   ROS:   Please see the history of present illness.    ROS All other systems reviewed and are negative.   PHYSICAL EXAM:   VS:  BP 131/62   Pulse 61   Ht 5' 3.5" (1.613 m)   Wt 149 lb 3.2 oz (67.7 kg)   SpO2 97%   BMI 26.02 kg/m    There is a mild difference in blood pressures between the right and left upper extremities Right upper extremity 131/62 mmHg Left upper extremity 117/59 mmHg   General: Alert, oriented x3, no distress, lean Head: no evidence of trauma, PERRL, EOMI, no exophtalmos or lid lag, no myxedema, no xanthelasma; normal ears, nose and oropharynx Neck: normal jugular venous pulsations and no hepatojugular reflux; brisk carotid pulses without delay; faint bilateral carotid bruits (R>L), no carotid delay or masses; scar of previous carotid-subclavian bypass surgery. She has bilateral subclavian bruits (L>R). Chest: clear to auscultation, no signs of consolidation by percussion or palpation, normal fremitus, symmetrical and full respiratory excursions Cardiovascular: normal position and quality of the apical impulse, regular rhythm, normal first and second heart sounds, no murmurs, rubs or gallops Abdomen: no tenderness or distention, no masses by palpation, no abnormal pulsatility or arterial bruits, normal bowel sounds, no hepatosplenomegaly Extremities: no clubbing, cyanosis  or edema; 2+ radial, ulnar and brachial pulses bilaterally; 2+ right femoral, posterior tibial and dorsalis pedis pulses; 2+ left femoral, posterior tibial and dorsalis pedis pulses; no subclavian or femoral bruits Neurological: grossly nonfocal Psych: Normal mood and affect  Wt Readings from Last 3 Encounters:  01/05/18 149 lb 3.2 oz (67.7 kg)  10/30/17 149 lb (67.6 kg)  09/10/17 148 lb (67.1 kg)    Studies/Labs Reviewed:   EKG:  EKG is ordered today.  It shows normal sinus rhythm with a single PAC, nonspecific T wave changes in the anterior leads Recent Labs: 10/30/2017: ALT 18; BUN 13; Creat 0.90; Hemoglobin 12.4; Platelets 294; Potassium 4.7; Sodium 140   Lipid Panel    Component Value Date/Time   CHOL 126 02/07/2016 0822   CHOL 141 04/07/2013 0857   TRIG 130 02/07/2016 0822   HDL 46 02/07/2016 0822   HDL 47 04/07/2013 0857   CHOLHDL 2.7 02/07/2016 0822   VLDL 26 02/07/2016 0822   LDLCALC 54 02/07/2016 0822   LDLCALC 67 04/07/2013 0857    ASSESSMENT:    1. CAD, cath 2009   2. 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[place])   3. Peripheral arterial disease ([place])   4. Pure hypercholesterolemia   5. Atherosclerosis of aorta ([facility])    PLAN:  In order of problems listed above:  1. CAD:  known moderate stenosis in the LAD artery by previous coronary angiography. Plan reevaluation for ischemia with a functional study next year. 2. 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 Status post aorto mesenteric bypass. 3. L SCL artery occlusion: Left carotid subclavian bypass is widely patent on recent ultrasound and she does not have any symptoms of upper extremity claudication or subclavian steal syndrome. However, the BP is lower by about 15 mm Hg on the left. Should always check BP on right side. 4. HLP: excellent lipid profile. Last cholesterol 126, LDL well under 70. 5. Ao atherosclerosis: on radiological studies. Extensive PAD.   Medication Adjustments/Labs and Tests  Ordered: Current medicines are reviewed at length with the patient today.  Concerns regarding medicines are outlined above.  Medication changes, Labs and Tests ordered today are listed in the Patient Instructions below. Patient Instructions  Medication Instructions: Dr Sallyanne Kuster recommends that you continue on your current medications as directed. Please refer to the Current Medication list given to you today.  Labwork: NONE ORDERED  Testing/Procedures: 1. Lexiscan Myoview Stress Test in 1 year - Your physician has requested that you have a lexiscan myoview. For further information please visit [email or web address]. Please follow instruction sheet, as given.  The test will take approximately 3 to 4 hours to complete; you may bring reading material.  If someone comes with you to your appointment, they will need to remain in the main lobby due to limited space in the testing area. **If you are pregnant or breastfeeding, please notify the nuclear lab prior to your appointment**  How to prepare for your Myocardial Perfusion Test:  Do not eat or drink 3 hours prior to your test, except you may have water.  Do not consume products containing caffeine (regular or decaffeinated) 12 hours prior to your test. (ex: coffee, chocolate, sodas, tea).  Do wear comfortable clothes (no dresses or overalls) and walking shoes, tennis shoes preferred (No heels or open toe shoes are allowed).  Do NOT wear cologne, perfume, aftershave, or lotions (deodorant is allowed).  If these instructions are not followed, your test will have to be rescheduled.  Follow-up: Dr Sallyanne Kuster recommends that you schedule a follow-up appointment in 12 months. You will receive a reminder letter in the mail two months in advance. If you don't receive a letter, please call our office to schedule the follow-up appointment.  If you need a refill on your cardiac medications before your  next appointment, please call your pharmacy.       Signed, Sanda Klein, MD  01/06/2018 1:18 PM    [facility] [address], [place], [place]  [address] Phone: [phone]; Fax: [phone]

## 2018-01-05 ENCOUNTER — Ambulatory Visit (INDEPENDENT_AMBULATORY_CARE_PROVIDER_SITE_OTHER): Payer: Medicare Other | Admitting: Cardiovascular Disease

## 2018-01-05 ENCOUNTER — Telehealth: Payer: Self-pay | Admitting: Cardiovascular Disease

## 2018-01-05 ENCOUNTER — Encounter: Payer: Self-pay | Admitting: Cardiovascular Disease

## 2018-01-05 VITALS — BP 131/62 | HR 61 | Ht 63.5 in | Wt 149.2 lb

## 2018-01-05 DIAGNOSIS — E7800 Pure hypercholesterolemia, unspecified: Secondary | ICD-10-CM

## 2018-01-05 DIAGNOSIS — I700 Atherosclerosis of aorta: Secondary | ICD-10-CM | POA: Diagnosis not present

## 2018-01-05 DIAGNOSIS — K551 Chronic vascular disorders of intestine: Secondary | ICD-10-CM

## 2018-01-05 DIAGNOSIS — I2510 Atherosclerotic heart disease of native coronary artery without angina pectoris: Secondary | ICD-10-CM

## 2018-01-05 DIAGNOSIS — I739 Peripheral vascular disease, unspecified: Secondary | ICD-10-CM | POA: Diagnosis not present

## 2018-01-05 NOTE — Telephone Encounter (Signed)
Thanks

## 2018-01-05 NOTE — Patient Instructions (Addendum)
Medication Instructions: Dr Sallyanne Kuster recommends that you continue on your current medications as directed. Please refer to the Current Medication list given to you today.  Labwork: NONE ORDERED  Testing/Procedures: 1. Lexiscan Myoview Stress Test in 1 year - Your physician has requested that you have a lexiscan myoview. For further information please visit [email or web address]. Please follow instruction sheet, as given.  The test will take approximately 3 to 4 hours to complete; you may bring reading material.  If someone comes with you to your appointment, they will need to remain in the main lobby due to limited space in the testing area. **If you are pregnant or breastfeeding, please notify the nuclear lab prior to your appointment**  How to prepare for your Myocardial Perfusion Test:  Do not eat or drink 3 hours prior to your test, except you may have water.  Do not consume products containing caffeine (regular or decaffeinated) 12 hours prior to your test. (ex: coffee, chocolate, sodas, tea).  Do wear comfortable clothes (no dresses or overalls) and walking shoes, tennis shoes preferred (No heels or open toe shoes are allowed).  Do NOT wear cologne, perfume, aftershave, or lotions (deodorant is allowed).  If these instructions are not followed, your test will have to be rescheduled.  Follow-up: Dr Sallyanne Kuster recommends that you schedule a follow-up appointment in 12 months. You will receive a reminder letter in the mail two months in advance. If you don't receive a letter, please call our office to schedule the follow-up appointment.  If you need a refill on your cardiac medications before your next appointment, please call your pharmacy.

## 2018-01-05 NOTE — Telephone Encounter (Signed)
Spoke with patient and she stated her last cholesterol was done 04/02/2017.  She had at outside lab so it is a scanned in document. Per patient Dr Sallyanne Kuster did not see the labs frm them at today's appointment Will forward to Dr Sallyanne Kuster so he can review labs.

## 2018-01-05 NOTE — Telephone Encounter (Signed)
New message    Patient was seen today calling back with additional information - lab is listed wrong.

## 2018-01-06 ENCOUNTER — Encounter: Payer: Self-pay | Admitting: Cardiovascular Disease

## 2018-01-19 ENCOUNTER — Encounter: Payer: Self-pay | Admitting: Family Medicine

## 2018-01-19 DIAGNOSIS — K2970 Gastritis, unspecified, without bleeding: Secondary | ICD-10-CM | POA: Diagnosis not present

## 2018-01-19 DIAGNOSIS — K3189 Other diseases of stomach and duodenum: Secondary | ICD-10-CM | POA: Diagnosis not present

## 2018-01-19 DIAGNOSIS — R1010 Upper abdominal pain, unspecified: Secondary | ICD-10-CM | POA: Diagnosis not present

## 2018-01-19 DIAGNOSIS — K317 Polyp of stomach and duodenum: Secondary | ICD-10-CM | POA: Diagnosis not present

## 2018-01-20 ENCOUNTER — Other Ambulatory Visit: Payer: Self-pay

## 2018-01-20 MED ORDER — CLORAZEPATE DIPOTASSIUM 7.5 MG PO TABS: 7.5000 mg | ORAL_TABLET | Freq: Every day | ORAL | 0 refills | Status: DC | PRN

## 2018-01-20 MED ORDER — CLORAZEPATE DIPOTASSIUM 7.5 MG PO TABS: 7.5000 mg | ORAL_TABLET | Freq: Every day | ORAL | 1 refills | Status: DC | PRN

## 2018-01-20 NOTE — Telephone Encounter (Signed)
I tried to send this medication in electronically twice but it kept printing. Do you mind sending this in electronically to [facility]. Rhonda Cunningham,CMA

## 2018-01-22 ENCOUNTER — Telehealth: Payer: Self-pay | Admitting: Vascular Surgery

## 2018-01-22 NOTE — Telephone Encounter (Signed)
Sched. Appt. Mailed letter LVM 02/02/18 9am mesenteric 9:45 f/u MD

## 2018-01-28 ENCOUNTER — Encounter (HOSPITAL_COMMUNITY): Payer: Medicare Other

## 2018-01-30 ENCOUNTER — Other Ambulatory Visit: Payer: Self-pay

## 2018-01-30 DIAGNOSIS — K551 Chronic vascular disorders of intestine: Secondary | ICD-10-CM

## 2018-01-30 MED ORDER — ATENOLOL 25 MG PO TABS: 25.0000 mg | ORAL_TABLET | Freq: Every day | ORAL | 3 refills | Status: DC

## 2018-02-02 ENCOUNTER — Ambulatory Visit (INDEPENDENT_AMBULATORY_CARE_PROVIDER_SITE_OTHER): Payer: Medicare Other | Admitting: Vascular Surgery

## 2018-02-02 ENCOUNTER — Other Ambulatory Visit: Payer: Self-pay

## 2018-02-02 ENCOUNTER — Encounter: Payer: Self-pay | Admitting: Vascular Surgery

## 2018-02-02 ENCOUNTER — Ambulatory Visit (HOSPITAL_COMMUNITY)
Admission: RE | Admit: 2018-02-02 | Discharge: 2018-02-02 | Disposition: A | Discharge status: Home / Self Care | Payer: Medicare Other | Source: Ambulatory Visit | Attending: Vascular Surgery | Admitting: Vascular Surgery

## 2018-02-02 VITALS — BP 127/64 | HR 59 | Resp 18 | Ht 63.5 in | Wt 148.6 lb

## 2018-02-02 DIAGNOSIS — I2510 Atherosclerotic heart disease of native coronary artery without angina pectoris: Secondary | ICD-10-CM | POA: Diagnosis not present

## 2018-02-02 DIAGNOSIS — R1010 Upper abdominal pain, unspecified: Secondary | ICD-10-CM | POA: Diagnosis not present

## 2018-02-02 DIAGNOSIS — K551 Chronic vascular disorders of intestine: Secondary | ICD-10-CM | POA: Diagnosis not present

## 2018-02-02 DIAGNOSIS — I10 Essential (primary) hypertension: Secondary | ICD-10-CM | POA: Diagnosis not present

## 2018-02-02 NOTE — Progress Notes (Signed)
Patient name: Traci [name] MRN: [id number] DOB: 1945/10/05 Sex: female  REASON FOR VISIT:   Follow-up after previous mesenteric bypass.  HPI:   SOPHONIE GOFORTH is a pleasant 72 y.o. female who underwent exploratory laparotomy, lysis of adhesions, and an aorta to superior mesenteric artery bypass with a 6 mm Dacron graft on 01/27/2014.  I last saw her on 09/10/2017.  She also undergone a previous left carotid to subclavian bypass.  At the time of her last visit her carotid duplex scan showed no significant carotid disease.  The left carotid subclavian bypass was patent.  I recommended a follow-up carotid duplex in 1 year which would put that in March 2020.  At the time of her last visit she was having some GI issues but was not having postprandial abdominal pain and I did not think she had any evidence of mesenteric ischemia.  She had no weight loss.  About a month ago, she developed some abdominal pain.  This was periumbilical pain that would occur about an hour after eating.  She denies any weight loss.  She has recently switched to a bland diet which is helped.  She apparently underwent endoscopy which showed a polyp in her stomach.  She is scheduled to see her normal gastroenterologist next week I believe.  She denies any associated symptoms.  She denies nausea or vomiting.  She denies any history of stroke, TIAs, expressive or receptive aphasia, or amaurosis fugax.  Past Medical History:  Diagnosis Date  . Anxiety   . Atrial fibrillation ([place])   . CAD (coronary artery disease)   . Claudication ([place]) 01/06/2008   Lower extremity dopplers - no evidence of arterial insufficiency, normal exam  . Coronary artery disease   . GERD (gastroesophageal reflux disease)   . Hyperlipidemia   . Hypertension 11/30/2010   echo- EF 55%; normal w/ mildly sclerotic aortic valve  . Hypertension 11/05/11   renal dopplers - celiac artery and SMA >50% diameter reduction, R renal artery -  mildly elevated velocities 1-59% diameter reduction, L renal artery normal  . Nonspecific ST-T wave electrocardiographic changes 03/26/2011   R/Lmv - EF 74%, normal perfusion all regions, ST depression w/ Lexiscan infusion w/o assoc angina  . Osteopenia   . PAD (peripheral artery disease) ([place])   . Peripheral vascular disease ([place])   . PVD (peripheral vascular disease) ([place]) 11/05/2011   doppler - R/L brachial pressures essentially equal w/o inflow disease; L sublclavian/CCA bypass graft demonstrates patent flow, no evidence of significant stenosis  . Sigmoid diverticulitis     Family History  Problem Relation Age of Onset  . Heart disease Father 39       Heart Disease before age 3  . Kidney disease Father   . Heart attack Father   . Hyperlipidemia Father   . Hypertension Father   . Kidney failure Mother   . Diabetes Maternal Grandmother   . Heart disease Paternal Grandfather     SOCIAL HISTORY: Social History   Tobacco Use  . Smoking status: Former Smoker    Packs/day: 0.25    Years: 20.00    Pack years: 5.00    Types: Cigarettes    Last attempt to quit: 09/30/1993    Years since quitting: 24.3  . Smokeless tobacco: Never Used  Substance Use Topics  . Alcohol use: No    Allergies  Allergen Reactions  . Cortisone Anaphylaxis  . Dilaudid [Hydromorphone Hcl] Nausea And Vomiting    Pt states she  will start vomiting immediately for 6 hours  . Iodine Anaphylaxis  . Medrol [Methylprednisolone] Anaphylaxis  . Omnipaque [Iohexol] Anaphylaxis  . Prednisone Anaphylaxis  . Shellfish Allergy Anaphylaxis  . Sulfa Drugs Cross Reactors Anaphylaxis  . Augmentin [Amoxicillin-Pot Clavulanate] Nausea Only    Upset stomach (Has taken cephalexin in the past without problems)  . Codeine Rash  . Erythromycin Rash  . Morphine And Related Nausea Only    nausea    Current Outpatient Medications  Medication Sig Dispense Refill  . aspirin EC 81 MG tablet Take 81 mg by mouth daily.      Marland Kitchen atenolol (TENORMIN) 25 MG tablet Take 1 tablet (25 mg total) by mouth daily. 90 tablet 3  . atorvastatin (LIPITOR) 10 MG tablet Take 1 tablet (10 mg total) daily at 6 PM by mouth. 90 tablet 3  . candesartan (ATACAND) 16 MG tablet Take 0.5 tablets (8 mg total) daily by mouth. 45 tablet 3  . cholecalciferol (VITAMIN D) 1000 UNITS tablet Take 1,000 Units by mouth daily.    . clopidogrel (PLAVIX) 75 MG tablet Take 1 tablet (75 mg total) daily by mouth. 90 tablet 3  . clorazepate (TRANXENE) 7.5 MG tablet Take 1 tablet (7.5 mg total) by mouth daily as needed. 90 tablet 0  . cyanocobalamin 1000 MCG tablet Take 100 mcg by mouth daily.    Marland Kitchen EPINEPHrine (EPIPEN 2-PAK) 0.3 mg/0.3 mL IJ SOAJ injection Inject 0.3 mLs (0.3 mg total) into the muscle as needed (for allergic reaction). 2 Device 0  . esomeprazole (NEXIUM) 40 MG capsule Take 1 capsule (40 mg total) by mouth daily. Take 1 tab daily (Patient taking differently: Take 40 mg by mouth daily. ) 90 capsule 0  . estradiol (ESTRACE) 0.5 MG tablet TAKE 1 TABLET DAILY 90 tablet 1  . furosemide (LASIX) 20 MG tablet TAKE 1 TABLET EVERY OTHER DAY 45 tablet 2  . linaclotide (LINZESS) 145 MCG CAPS capsule Take 1 capsule by mouth daily as needed.    . meclizine (ANTIVERT) 25 MG tablet TAKE ONE-HALF (1/2) TABLET THREE TIMES A DAY AS NEEDED FOR DIZZINESS OR NAUSEA 90 tablet 1  . Multiple Vitamin (MULTIVITAMIN WITH MINERALS) TABS Take 1 tablet by mouth daily.    Marland Kitchen zolpidem (AMBIEN) 10 MG tablet Take 1 tablet (10 mg total) by mouth at bedtime as needed for sleep. 90 tablet 1   No current facility-administered medications for this visit.     REVIEW OF SYSTEMS:  [X]  denotes positive finding, [ ]  denotes negative finding Cardiac  Comments:  Chest pain or chest pressure:    Shortness of breath upon exertion:    Short of breath when lying flat:    Irregular heart rhythm:        Vascular    Pain in calf, thigh, or hip brought on by ambulation:    Pain in feet at  night that wakes you up from your sleep:     Blood clot in your veins:    Leg swelling:         Pulmonary    Oxygen at home:    Productive cough:     Wheezing:         Neurologic    Sudden weakness in arms or legs:     Sudden numbness in arms or legs:     Sudden onset of difficulty speaking or slurred speech:    Temporary loss of vision in one eye:     Problems with dizziness:  Gastrointestinal    Blood in stool:     Vomited blood:         Genitourinary    Burning when urinating:     Blood in urine:        Psychiatric    Major depression:         Hematologic    Bleeding problems:    Problems with blood clotting too easily:        Skin    Rashes or ulcers:        Constitutional    Fever or chills:     PHYSICAL EXAM:   Vitals:   02/02/18 0931  BP: 127/64  Pulse: (!) 59  Resp: 18  SpO2: 100%  Weight: 148 lb 9.6 oz (67.4 kg)  Height: 5' 3.5" (1.613 m)    GENERAL: The patient is a well-nourished female, in no acute distress. The vital signs are documented above. CARDIAC: There is a regular rate and rhythm.  VASCULAR: She has a soft right carotid bruit. She has palpable femoral and posterior tibial pulses bilaterally. PULMONARY: There is good air exchange bilaterally without wheezing or rales. ABDOMEN: Soft and non-tender with normal pitched bowel sounds.  I do not appreciate any abdominal bruits. MUSCULOSKELETAL: There are no major deformities or cyanosis. NEUROLOGIC: No focal weakness or paresthesias are detected. SKIN: There are no ulcers or rashes noted. PSYCHIATRIC: The patient has a normal affect.  DATA:    MESENTERIC DUPLEX: I have independently interpreted her mesenteric duplex scan today.  Her aorta to mid superior mesenteric artery bypass is patent.  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  Her celiac axis is patent without elevated velocities.  MEDICAL ISSUES:   ABDOMINAL PAIN: This patient is being worked up for abdominal pain.  Her duplex scan  shows that her aorta SMA bypass is patent.  There is no significant disease of the celiac axis.  Thus I do not think her abdominal pain is related to mesenteric ischemia.  She is scheduled to follow-up with her gastroenterologist next week.  I will refer to this note to them.  She is scheduled to see me back in March 2020 for her routine follow-up.  She will keep that appointment.  She knows to call sooner if she has problems.  [name] [facility] [phone]

## 2018-02-03 ENCOUNTER — Encounter: Payer: Self-pay | Admitting: Family Medicine

## 2018-02-03 ENCOUNTER — Ambulatory Visit (INDEPENDENT_AMBULATORY_CARE_PROVIDER_SITE_OTHER): Payer: Medicare Other | Admitting: Family Medicine

## 2018-02-03 VITALS — BP 123/52 | HR 60 | Wt 148.0 lb

## 2018-02-03 DIAGNOSIS — R1011 Right upper quadrant pain: Secondary | ICD-10-CM

## 2018-02-03 DIAGNOSIS — M949 Disorder of cartilage, unspecified: Secondary | ICD-10-CM | POA: Diagnosis not present

## 2018-02-03 DIAGNOSIS — M810 Age-related osteoporosis without current pathological fracture: Secondary | ICD-10-CM

## 2018-02-03 DIAGNOSIS — I2510 Atherosclerotic heart disease of native coronary artery without angina pectoris: Secondary | ICD-10-CM

## 2018-02-03 DIAGNOSIS — M899 Disorder of bone, unspecified: Secondary | ICD-10-CM | POA: Diagnosis not present

## 2018-02-03 LAB — BASIC METABOLIC PANEL
CREATININE: 1 (ref 0.5–1.1)
Glucose: 106
Potassium: 4.8 (ref 3.4–5.3)
Sodium: 138 (ref 137–147)

## 2018-02-03 LAB — HEPATIC FUNCTION PANEL
ALK PHOS: 52 (ref 25–125)
BILIRUBIN, TOTAL: 0.5

## 2018-02-03 MED ORDER — SUCRALFATE 1 G PO TABS: 1.0000 g | ORAL_TABLET | Freq: Four times a day (QID) | ORAL | 0 refills | Status: DC

## 2018-02-03 NOTE — Patient Instructions (Addendum)
Thank you for coming in today. Start carafate.  Get labs today.  Recheck in 2 weeks or sooner. We will likely be thinking about getting an abdominal ultrasound soon based on labs.   If your belly pain worsens, or you have high fever, bad vomiting, blood in your stool or black tarry stool go to the Emergency Room.

## 2018-02-03 NOTE — Progress Notes (Signed)
Traci [name] is a 72 y.o. female who presents to [facility] [place] today for right abdominal pain. Storm notes right upper quadrant abdominal pain present for about 6 weeks.  She denies any injury or change in medication.  She notes pain occurs after eating.  No fevers or chills or vomiting.  She notes decreased appetite.  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  She was seen by her gastroenterology team in early July.  Her regular gastroneurologist was away and she was seen by Dr. Hendricks Limes at [facility].  She ordered a MRA abdomen which did not show new occlusion of the bypass graft at the SMA.  She additionally proceeded to an upper endoscopy.  Upper endoscopy was also not available at the time of this note however per patient report she had mild gastritis and a benign-appearing polyp.  Additionally Traci [name] has been seen by her vascular surgeon Dr. Scot Dock yesterday who obtained an vascular ultrasound study of the aorta and mesenteric arteries that did not show any restenosis.  Traci [name] remains quite symptomatic.  She notes moderate to severe pain in her right side of her abdomen worse following food.  She she denies any urinary frequency urgency or dysuria.  She notes that she has not had much of a lab reassessment yet.   Additionally she like to discuss her recent diagnosis of osteoporosis.  She had a DEXA scan in July which showed a T score of -2.5 in her lumbar spine.  She takes calcium and vitamin D.   ROS as above:  Exam:  BP (!) 123/52   Pulse 60   Wt 148 lb (67.1 kg)   BMI 25.81 kg/m  Gen: Well NAD HEENT: EOMI,  MMM Lungs: Normal work of breathing. CTABL Heart: RRR no MRG Abd: NABS, Soft. Nondistended, mildly tender right upper quadrant no rebound or guarding. Exts: Brisk capillary refill, warm and well perfused.   Lab  and Radiology Results  Acute Interface, Incoming Rad Results - 12/22/2017  8:34 PM EDT MR Angiogram Abdomen and Pelvis with and without IV contrast  Indication: History of SMA bypass, evaluate for stenosis  Comparison: None  Technique: Multiplanar multiphasic MRI with 3D contrasted MRA. 13 cc Multihance was administered intravenously.   Findings:  Please note the study is not designed for detailed soft tissue evaluation.   Celiac artery is patent. 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 There is a patent bypass extending from the aorta to the mid SMA. The mid SMA is patent. The distal SMA branches do not opacify likely due to phase of contrast. There are single bilateral renal  arteries which are patent. The common iliac arteries, external iliac arteries and internal iliac arteries are patent. The IMA is faintly visualized and is likely patent. There is conventional venous drainage. The main portal vein is patent.    Impression: 1. Patent aorta to mid SMA bypass. 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  Electronically Signed by: Mickeal Skinner  Duplex vascular ultrasound mesenteric arteries 02/02/2018 2:59 PM -  Narrative   ABDOMINAL VISCERAL  Indications: Aorta to mid SMA bypass 2015  High Risk Factors: Hypertension.  Other Factors: SMA stenosis.  Limitations: Air/bowel gas and patient discomfort. Performing Technologist: Ronal Fear RVS, RCS   Examination Guidelines: A complete evaluation includes B-mode imaging, spectral Doppler, color Doppler, and power Doppler as needed of all accessible portions of each vessel. Bilateral testing is considered an integral  part of a complete examination. Limited examinations for reoccurring indications may be performed as noted.   Duplex Findings: +--------------------+--------+--------+------+ Mesenteric     PSV cm/sEDV cm/sPlaque +--------------------+--------+--------+------+ Aorta at SMA      91           +--------------------+--------+--------+------+ Aorta Prox       91          +--------------------+--------+--------+------+ Aorta Mid       108          +--------------------+--------+--------+------+ Aorta Distal      61          +--------------------+--------+--------+------+ Celiac Artery Origin 132          +--------------------+--------+--------+------+ SMA Proximal      0          +--------------------+--------+--------+------+ SMA Mid         96          +--------------------+--------+--------+------+ SMA Distal       54          +--------------------+--------+--------+------+ Splenic        143          +--------------------+--------+--------+------+   FINAL INTERPRETATION: Mesenteric:  Patent aorta to mid SMA bypass. Proximal SMA is occluded.  *See table(s) above for measurements and observations.  Diagnosing physician: [name] MD  Electronically signed by [name] MD on 02/02/2018 at 2:59:03 PM.  I personally (independently) visualized and performed the interpretation of the images attached in this note.      Assessment and Plan: 72 y.o. female with right upper quadrant abdominal pain.  Etiology is unclear. Differential includes mesenteric ischemia, sphincter of Oddi dysfunction, hepatitis, pancreatitis, renal stone, thoracic radicular pain, musculoskeletal pain, rib pain.  Plan for limited metabolic work-up listed below including CBC with differential, CMP, and lipase.  Likely will proceed with complete abdominal ultrasound and possibly to noncontrast CT scan.  Given her self-reported history of gastritis on upper endoscopy I think is reasonable to add Carafate.  We will continue PPI.  We will also request medical records from gastroenterology visits including recent upper endoscopy notes.   Recheck back with me in 2 weeks.  Return sooner if needed.  Osteoporosis: Discussed options.  Plan for assessment of calcium vitamin D and parathyroid hormone.  Likely will start Prolia therapy when patient is less symptomatic from her abdomen.   Orders Placed This Encounter  Procedures  . CBC with Differential/Platelet  . COMPLETE METABOLIC PANEL WITH GFR  . Lipase  . VITAMIN D 25 Hydroxy (Vit-D Deficiency, Fractures)  . PTH, Intact and Calcium   Meds ordered this encounter  Medications  . sucralfate (CARAFATE) 1 g tablet    Sig: Take 1 tablet (1 g total) by mouth 4 (four) times daily.    Dispense:  120 tablet    Refill:  0     Historical information moved to improve visibility of documentation.  Past Medical History:  Diagnosis Date  . Anxiety   . Atrial fibrillation ([place])   . CAD (coronary artery disease)   . Claudication ([place]) 01/06/2008   Lower extremity dopplers - no evidence of arterial insufficiency, normal exam  . Coronary artery disease   . GERD (gastroesophageal reflux disease)   . Hyperlipidemia   . Hypertension 11/30/2010   echo- EF 55%; normal w/ mildly sclerotic aortic valve  . Hypertension 11/05/11   renal dopplers - celiac artery and SMA >50% diameter reduction, R renal artery - mildly elevated velocities 1-59% diameter reduction, L renal artery  normal  . Nonspecific ST-T wave electrocardiographic changes 03/26/2011   R/Lmv - EF 74%, normal perfusion all regions, ST depression w/ Lexiscan infusion w/o assoc angina  . Osteopenia   . PAD (peripheral artery disease) ([place])   . Peripheral vascular disease ([place])   . PVD (peripheral vascular disease) ([place]) 11/05/2011   doppler - R/L brachial pressures essentially equal w/o inflow disease; L sublclavian/CCA bypass graft demonstrates patent flow, no evidence of significant stenosis  . Sigmoid diverticulitis    Past Surgical History:  Procedure Laterality Date  . ABDOMINAL AORTIC ANEURYSM REPAIR N/A 01/27/2014    Procedure: AORTO-SUPERIOR MESENTERIC ARTERY BYPASS GRAFT;  Surgeon: Angelia Mould, MD;  Location: [place];  Service: Vascular;  Laterality: N/A;  . ABDOMINAL HYSTERECTOMY    . APPENDECTOMY    . CARDIAC CATHETERIZATION  06/03/2008   60% LAD, involving D2, borderline significant by IVUS, medical therapy. CFX, RCA OK.  Marland Kitchen CATARACT EXTRACTION    . CHOLECYSTECTOMY    . EYE SURGERY     retinal surgery  . SPINE SURGERY  Feb. 2011  . [place]   X's  several  . SUBCLAVIAN ARTERY STENT Left 09/20/2008   LSA ISR 7x90mm Cordis Genesis on Opta premount, reduction from 80% to 0%  . subclavian artery stents  01/23/2010   Left carotid to subclavian artery Bypass  . VISCERAL ANGIOGRAM  01/17/2014   Procedure: VISCERAL ANGIOGRAM;  Surgeon: Angelia Mould, MD;  Location[facility] C[facility] LAB;  Service: Cardiovascular;;   Social History   Tobacco Use  . Smoking status: Former Smoker    Packs/day: 0.25    Years: 20.00    Pack years: 5.00    Types: Cigarettes    Last attempt to quit: 09/30/1993    Years since quitting: 24.3  . Smokeless tobacco: Never Used  Substance Use Topics  . Alcohol use: No   family history includes Diabetes in her maternal grandmother; Heart attack in her father; Heart disease in her paternal grandfather; Heart disease (age of onset: 52) in her father; Hyperlipidemia in her father; Hypertension in her father; Kidney disease in her father; Kidney failure in her mother.  Medications: Current Outpatient Medications  Medication Sig Dispense Refill  . aspirin EC 81 MG tablet Take 81 mg by mouth daily.    Marland Kitchen atenolol (TENORMIN) 25 MG tablet Take 1 tablet (25 mg total) by mouth daily. 90 tablet 3  . atorvastatin (LIPITOR) 10 MG tablet Take 1 tablet (10 mg total) daily at 6 PM by mouth. 90 tablet 3  . candesartan (ATACAND) 16 MG tablet Take 0.5 tablets (8 mg total) daily by mouth. 45 tablet 3  . cholecalciferol (VITAMIN D) 1000 UNITS tablet Take 1,000 Units by  mouth daily.    . clopidogrel (PLAVIX) 75 MG tablet Take 1 tablet (75 mg total) daily by mouth. 90 tablet 3  . clorazepate (TRANXENE) 7.5 MG tablet Take 1 tablet (7.5 mg total) by mouth daily as needed. 90 tablet 0  . cyanocobalamin 1000 MCG tablet Take 100 mcg by mouth daily.    Marland Kitchen EPINEPHrine (EPIPEN 2-PAK) 0.3 mg/0.3 mL IJ SOAJ injection Inject 0.3 mLs (0.3 mg total) into the muscle as needed (for allergic reaction). 2 Device 0  . esomeprazole (NEXIUM) 40 MG capsule Take 1 capsule (40 mg total) by mouth daily. Take 1 tab daily (Patient taking differently: Take 40 mg by mouth daily. ) 90 capsule 0  . estradiol (ESTRACE) 0.5 MG tablet TAKE 1 TABLET DAILY  90 tablet 1  . furosemide (LASIX) 20 MG tablet TAKE 1 TABLET EVERY OTHER DAY 45 tablet 2  . linaclotide (LINZESS) 145 MCG CAPS capsule Take 1 capsule by mouth daily as needed.    . meclizine (ANTIVERT) 25 MG tablet TAKE ONE-HALF (1/2) TABLET THREE TIMES A DAY AS NEEDED FOR DIZZINESS OR NAUSEA 90 tablet 1  . Multiple Vitamin (MULTIVITAMIN WITH MINERALS) TABS Take 1 tablet by mouth daily.    Marland Kitchen zolpidem (AMBIEN) 10 MG tablet Take 1 tablet (10 mg total) by mouth at bedtime as needed for sleep. 90 tablet 1  . sucralfate (CARAFATE) 1 g tablet Take 1 tablet (1 g total) by mouth 4 (four) times daily. 120 tablet 0   No current facility-administered medications for this visit.    Allergies  Allergen Reactions  . Cortisone Anaphylaxis  . Dilaudid [Hydromorphone Hcl] Nausea And Vomiting    Pt states she will start vomiting immediately for 6 hours  . Iodine Anaphylaxis  . Medrol [Methylprednisolone] Anaphylaxis  . Omnipaque [Iohexol] Anaphylaxis  . Prednisone Anaphylaxis  . Shellfish Allergy Anaphylaxis  . Sulfa Drugs Cross Reactors Anaphylaxis  . Augmentin [Amoxicillin-Pot Clavulanate] Nausea Only    Upset stomach (Has taken cephalexin in the past without problems)  . Codeine Rash  . Erythromycin Rash  . Morphine And Related Nausea Only     nausea     Discussed warning signs or symptoms. Please see discharge instructions. Patient expresses understanding.

## 2018-02-04 ENCOUNTER — Ambulatory Visit: Payer: Medicare Other | Admitting: Vascular Surgery

## 2018-02-04 DIAGNOSIS — K317 Polyp of stomach and duodenum: Secondary | ICD-10-CM | POA: Diagnosis not present

## 2018-02-04 DIAGNOSIS — R1011 Right upper quadrant pain: Secondary | ICD-10-CM | POA: Diagnosis not present

## 2018-02-04 DIAGNOSIS — R110 Nausea: Secondary | ICD-10-CM | POA: Diagnosis not present

## 2018-02-04 DIAGNOSIS — K5900 Constipation, unspecified: Secondary | ICD-10-CM | POA: Diagnosis not present

## 2018-02-04 LAB — COMPLETE METABOLIC PANEL WITH GFR
AG RATIO: 1.6 (calc) (ref 1.0–2.5)
ALKALINE PHOSPHATASE (APISO): 52 U/L (ref 33–130)
ALT: 16 U/L (ref 6–29)
AST: 20 U/L (ref 10–35)
Albumin: 4.1 g/dL (ref 3.6–5.1)
BILIRUBIN TOTAL: 0.5 mg/dL (ref 0.2–1.2)
BUN/Creatinine Ratio: 18 (calc) (ref 6–22)
BUN: 19 mg/dL (ref 7–25)
CHLORIDE: 101 mmol/L (ref 98–110)
CO2: 28 mmol/L (ref 20–32)
Calcium: 8.9 mg/dL (ref 8.6–10.4)
Creat: 1.04 mg/dL — ABNORMAL HIGH (ref 0.60–0.93)
GFR, Est African American: 63 mL/min/{1.73_m2} (ref >=60)
GFR, Est Non African American: 54 mL/min/{1.73_m2} — ABNORMAL LOW (ref >=60)
GLOBULIN: 2.6 g/dL (ref 1.9–3.7)
Glucose, Bld: 106 mg/dL — ABNORMAL HIGH (ref 65–99)
POTASSIUM: 4.8 mmol/L (ref 3.5–5.3)
SODIUM: 138 mmol/L (ref 135–146)
Total Protein: 6.7 g/dL (ref 6.1–8.1)

## 2018-02-04 LAB — CBC WITH DIFFERENTIAL/PLATELET
BASOS ABS: 40 {cells}/uL (ref 0–200)
Basophils Relative: 0.8 %
EOS PCT: 4 %
Eosinophils Absolute: 200 cells/uL (ref 15–500)
HEMATOCRIT: 36.1 % (ref 35.0–45.0)
Hemoglobin: 12.2 g/dL (ref 11.7–15.5)
Lymphs Abs: 1495 cells/uL (ref 850–3900)
MCH: 29.5 pg (ref 27.0–33.0)
MCHC: 33.8 g/dL (ref 32.0–36.0)
MCV: 87.2 fL (ref 80.0–100.0)
MPV: 9.4 fL (ref 7.5–12.5)
Monocytes Relative: 6.6 %
NEUTROS PCT: 58.7 %
Neutro Abs: 2935 cells/uL (ref 1500–7800)
Platelets: 323 10*3/uL (ref 140–400)
RBC: 4.14 10*6/uL (ref 3.80–5.10)
RDW: 12.7 % (ref 11.0–15.0)
Total Lymphocyte: 29.9 %
WBC mixed population: 330 cells/uL (ref 200–950)
WBC: 5 10*3/uL (ref 3.8–10.8)

## 2018-02-04 LAB — PTH, INTACT AND CALCIUM
Calcium: 8.9 mg/dL (ref 8.6–10.4)
PTH: 20 pg/mL (ref 14–64)

## 2018-02-04 LAB — LIPASE: LIPASE: 27 U/L (ref 7–60)

## 2018-02-04 LAB — VITAMIN D 25 HYDROXY (VIT D DEFICIENCY, FRACTURES): VIT D 25 HYDROXY: 32 ng/mL (ref 30–100)

## 2018-02-05 ENCOUNTER — Encounter: Payer: Self-pay | Admitting: Family Medicine

## 2018-02-05 NOTE — Progress Notes (Signed)
Upper endoscopy report received from [facility]:  Impression: Normal esophagus. Single gastric polyp biopsied. Nonbleeding erosive gastropathy biopsied. Erythematous duodenum up at the. Normal second portion of duodenum  Will be sent to scan.  Biopsy results show stomach biopsies suggestive of chemical gastritis negative for H. pylori

## 2018-02-13 ENCOUNTER — Telehealth: Payer: Self-pay | Admitting: Family Medicine

## 2018-02-13 DIAGNOSIS — R110 Nausea: Secondary | ICD-10-CM | POA: Diagnosis not present

## 2018-02-13 NOTE — Telephone Encounter (Signed)
Left VM for Pt.

## 2018-02-13 NOTE — Telephone Encounter (Signed)
Traci [name] called this morning to say she had her bloodwork drawn. She said said she received a message that her results were lost. She has an appointment Tuesday morning,and would like to know if she should keep this appt.. She would also like to know if she should have her blood drawn again. She stated that it is okay to leave her a message.

## 2018-02-13 NOTE — Telephone Encounter (Signed)
I did finally receive the labs as a fax.  They looked pretty normal without significant change from baseline.

## 2018-02-13 NOTE — Telephone Encounter (Signed)
Labs were not lost. Called Pt and left VM to advise. Labs printed for PCP review prior to appt.

## 2018-02-17 ENCOUNTER — Ambulatory Visit: Payer: Medicare Other | Admitting: Family Medicine

## 2018-02-18 ENCOUNTER — Ambulatory Visit (INDEPENDENT_AMBULATORY_CARE_PROVIDER_SITE_OTHER): Payer: Medicare Other | Admitting: Family Medicine

## 2018-02-18 ENCOUNTER — Encounter: Payer: Self-pay | Admitting: Family Medicine

## 2018-02-18 VITALS — BP 103/64 | HR 73 | Wt 149.2 lb

## 2018-02-18 DIAGNOSIS — M810 Age-related osteoporosis without current pathological fracture: Secondary | ICD-10-CM

## 2018-02-18 DIAGNOSIS — R1011 Right upper quadrant pain: Secondary | ICD-10-CM | POA: Diagnosis not present

## 2018-02-18 DIAGNOSIS — I2510 Atherosclerotic heart disease of native coronary artery without angina pectoris: Secondary | ICD-10-CM | POA: Diagnosis not present

## 2018-02-18 DIAGNOSIS — N183 Chronic kidney disease, stage 3 unspecified: Secondary | ICD-10-CM

## 2018-02-18 NOTE — Patient Instructions (Signed)
Thank you for coming in today. Recheck as needed.  Have fun on vacation.  Let me know when you get your flu shot.

## 2018-02-18 NOTE — Progress Notes (Signed)
Traci [name] is a 72 y.o. female who presents to [facility] today for follow-up abdominal pain.Traci [name] was seen previously for abdominal pain.  Upper endoscopy showed some gastritis and a benign-appearing polyp.  She was treated empirically with NSAIDs and Carafate and did very well.  She is basically asymptomatic now.  She has followed back up with her gastroenterologist who suspected gastroparesis.  Fortunately she had a gastric emptying study that was normal recently.  She has adopted eating smaller more frequent meals and notes this does help her symptoms however.  She no longer is taking Carafate regularly.  Additionally she is planning her Prolia injections.  She was found to have osteoporosis and had some labs obtained last month.  She notes that she is traveling at the end of this month for vacation I would like to delay Prolia injection until October. ROS as above:  Exam:  BP 103/64   Pulse 73   Wt 149 lb 3.2 oz (67.7 kg)   BMI 26.02 kg/m  Wt Readings from Last 5 Encounters:  02/18/18 149 lb 3.2 oz (67.7 kg)  02/03/18 148 lb (67.1 kg)  02/02/18 148 lb 9.6 oz (67.4 kg)  01/05/18 149 lb 3.2 oz (67.7 kg)  10/30/17 149 lb (67.6 kg)    Gen: Well NAD HEENT: EOMI,  MMM Lungs: Normal work of breathing. CTABL Heart: RRR no MRG Abd: NABS, Soft. Nondistended, Nontender  Exts: Brisk capillary refill, warm and well perfused.   Lab and Radiology Results     NM Gastric Emptying Scan 02/13/2018 [facility]   Result Impression     IMPRESSION:  Normal solid gastric emptying study.  Electronically Signed by: Sol Passer     Result Narrative     Gastric Emptying Study.  COMPARISON: MRI of the abdomen from July 2019.  INDICATION: Female, 72 years old. chronic nausea.  TECHNIQUE: 1 mCi of Tc-66m sulfur colloid was administered orally in a  standard meal. Imaging was performed in anterior and posterior projections, with emptying calculated using geometric means.  FINDINGS:  The stomach is located normally in the left upper abdomen.  Solid gastric emptying at 146 minutes is 50% (normal range at 120 minutes is >40%). Solid gastric emptying at 241 minutes is 100% (normal range at 240 minutes is >90%).       Assessment and Plan: 72 y.o. female with  Abdominal pain.  Significant improved.  Very likely gastritis.  Plan for watchful waiting.  Recheck with this issue as needed.  Osteoporosis: Plan to schedule nurse visit for Prolia injection later this year.  Patient will be outside of 6-week calcium window at the end of this month in October.  We will recheck CMP just before Prolia.   Orders Placed This Encounter  Procedures  . COMPLETE METABOLIC PANEL WITH GFR   No orders of the defined types were placed in this encounter.    Historical information moved to improve visibility of documentation.  Past Medical History:  Diagnosis Date  . Anxiety   . Atrial fibrillation ([place])   . CAD (coronary artery disease)   . Claudication ([place]) 01/06/2008   Lower extremity dopplers - no evidence of arterial insufficiency, normal exam  . Coronary artery disease   . GERD (gastroesophageal reflux disease)   . Hyperlipidemia   . Hypertension 11/30/2010   echo- EF 55%; normal w/ mildly sclerotic aortic valve  . Hypertension 11/05/11   renal dopplers -  celiac artery and SMA >50% diameter reduction, R renal artery - mildly elevated velocities 1-59% diameter reduction, L renal artery normal  . Nonspecific ST-T wave electrocardiographic changes 03/26/2011   R/Lmv - EF 74%, normal perfusion all regions, ST depression w/ Lexiscan infusion w/o assoc angina  . Osteopenia   . PAD (peripheral artery disease) ([place])   . Peripheral vascular disease ([place])   . PVD (peripheral vascular disease) ([place]) 11/05/2011   doppler - R/L brachial pressures  essentially equal w/o inflow disease; L sublclavian/CCA bypass graft demonstrates patent flow, no evidence of significant stenosis  . Sigmoid diverticulitis    Past Surgical History:  Procedure Laterality Date  . ABDOMINAL AORTIC ANEURYSM REPAIR N/A 01/27/2014   Procedure: AORTO-SUPERIOR MESENTERIC ARTERY BYPASS GRAFT;  Surgeon: Angelia Mould, MD;  Location: [facility];  Service: Vascular;  Laterality: N/A;  . ABDOMINAL HYSTERECTOMY    . APPENDECTOMY    . CARDIAC CATHETERIZATION  06/03/2008   60% LAD, involving D2, borderline significant by IVUS, medical therapy. CFX, RCA OK.  Marland Kitchen CATARACT EXTRACTION    . CHOLECYSTECTOMY    . EYE SURGERY     retinal surgery  . SPINE SURGERY  Feb. 2011  . [place]   X's  several  . SUBCLAVIAN ARTERY STENT Left 09/20/2008   LSA ISR 7x63mm Cordis Genesis on Opta premount, reduction from 80% to 0%  . subclavian artery stents  01/23/2010   Left carotid to subclavian artery Bypass  . VISCERAL ANGIOGRAM  01/17/2014   Procedure: VISCERAL ANGIOGRAM;  Surgeon: Angelia Mould, MD;  Location: [facility] CATH LAB;  Service: Cardiovascular;;   Social History   Tobacco Use  . Smoking status: Former Smoker    Packs/day: 0.25    Years: 20.00    Pack years: 5.00    Types: Cigarettes    Last attempt to quit: 09/30/1993    Years since quitting: 24.4  . Smokeless tobacco: Never Used  Substance Use Topics  . Alcohol use: No   family history includes Diabetes in her maternal grandmother; Heart attack in her father; Heart disease in her paternal grandfather; Heart disease (age of onset: 57) in her father; Hyperlipidemia in her father; Hypertension in her father; Kidney disease in her father; Kidney failure in her mother.  Medications: Current Outpatient Medications  Medication Sig Dispense Refill  . aspirin EC 81 MG tablet Take 81 mg by mouth daily.    Marland Kitchen atenolol (TENORMIN) 25 MG tablet Take 1 tablet (25 mg total) by mouth daily. 90 tablet 3  .  atorvastatin (LIPITOR) 10 MG tablet Take 1 tablet (10 mg total) daily at 6 PM by mouth. 90 tablet 3  . candesartan (ATACAND) 16 MG tablet Take 0.5 tablets (8 mg total) daily by mouth. 45 tablet 3  . cholecalciferol (VITAMIN D) 1000 UNITS tablet Take 1,000 Units by mouth daily.    . clopidogrel (PLAVIX) 75 MG tablet Take 1 tablet (75 mg total) daily by mouth. 90 tablet 3  . clorazepate (TRANXENE) 7.5 MG tablet Take 1 tablet (7.5 mg total) by mouth daily as needed. 90 tablet 0  . cyanocobalamin 1000 MCG tablet Take 100 mcg by mouth daily.    Marland Kitchen EPINEPHrine (EPIPEN 2-PAK) 0.3 mg/0.3 mL IJ SOAJ injection Inject 0.3 mLs (0.3 mg total) into the muscle as needed (for allergic reaction). 2 Device 0  . esomeprazole (NEXIUM) 40 MG capsule Take 1 capsule (40 mg total) by mouth daily. Take 1 tab daily (Patient taking differently: Take  40 mg by mouth daily. ) 90 capsule 0  . estradiol (ESTRACE) 0.5 MG tablet TAKE 1 TABLET DAILY 90 tablet 1  . furosemide (LASIX) 20 MG tablet TAKE 1 TABLET EVERY OTHER DAY 45 tablet 2  . linaclotide (LINZESS) 145 MCG CAPS capsule Take 1 capsule by mouth daily as needed.    . meclizine (ANTIVERT) 25 MG tablet TAKE ONE-HALF (1/2) TABLET THREE TIMES A DAY AS NEEDED FOR DIZZINESS OR NAUSEA 90 tablet 1  . Multiple Vitamin (MULTIVITAMIN WITH MINERALS) TABS Take 1 tablet by mouth daily.    . sucralfate (CARAFATE) 1 g tablet Take 1 tablet (1 g total) by mouth 4 (four) times daily. 120 tablet 0  . zolpidem (AMBIEN) 10 MG tablet Take 1 tablet (10 mg total) by mouth at bedtime as needed for sleep. 90 tablet 1   No current facility-administered medications for this visit.    Allergies  Allergen Reactions  . Cortisone Anaphylaxis  . Dilaudid [Hydromorphone Hcl] Nausea And Vomiting    Pt states she will start vomiting immediately for 6 hours  . Iodine Anaphylaxis  . Medrol [Methylprednisolone] Anaphylaxis  . Omnipaque [Iohexol] Anaphylaxis  . Prednisone Anaphylaxis  . Shellfish Allergy  Anaphylaxis  . Sulfa Drugs Cross Reactors Anaphylaxis  . Augmentin [Amoxicillin-Pot Clavulanate] Nausea Only    Upset stomach (Has taken cephalexin in the past without problems)  . Codeine Rash  . Erythromycin Rash  . Morphine And Related Nausea Only    nausea     Discussed warning signs or symptoms. Please see discharge instructions. Patient expresses understanding.

## 2018-02-23 ENCOUNTER — Encounter: Payer: Self-pay | Admitting: Family Medicine

## 2018-03-25 ENCOUNTER — Other Ambulatory Visit: Payer: Self-pay | Admitting: Family Medicine

## 2018-03-25 ENCOUNTER — Encounter: Payer: Self-pay | Admitting: Family Medicine

## 2018-03-25 DIAGNOSIS — N183 Chronic kidney disease, stage 3 (moderate): Secondary | ICD-10-CM | POA: Diagnosis not present

## 2018-03-25 LAB — COMPLETE METABOLIC PANEL WITH GFR
AG RATIO: 1.4 (calc) (ref 1.0–2.5)
ALBUMIN MSPROF: 3.8 g/dL (ref 3.6–5.1)
ALT: 15 U/L (ref 6–29)
AST: 23 U/L (ref 10–35)
Alkaline phosphatase (APISO): 48 U/L (ref 33–130)
BILIRUBIN TOTAL: 0.6 mg/dL (ref 0.2–1.2)
BUN/Creatinine Ratio: 12 (calc) (ref 6–22)
BUN: 13 mg/dL (ref 7–25)
CALCIUM: 8.4 mg/dL — AB (ref 8.6–10.4)
CHLORIDE: 106 mmol/L (ref 98–110)
CO2: 28 mmol/L (ref 20–32)
Creat: 1.06 mg/dL — ABNORMAL HIGH (ref 0.60–0.93)
GFR, EST AFRICAN AMERICAN: 61 mL/min/{1.73_m2} (ref >=60)
GFR, EST NON AFRICAN AMERICAN: 53 mL/min/{1.73_m2} — AB (ref >=60)
GLOBULIN: 2.7 g/dL (ref 1.9–3.7)
Glucose, Bld: 117 mg/dL — ABNORMAL HIGH (ref 65–99)
POTASSIUM: 4.1 mmol/L (ref 3.5–5.3)
SODIUM: 139 mmol/L (ref 135–146)
TOTAL PROTEIN: 6.5 g/dL (ref 6.1–8.1)

## 2018-04-23 DIAGNOSIS — Z885 Allergy status to narcotic agent status: Secondary | ICD-10-CM | POA: Diagnosis not present

## 2018-04-23 DIAGNOSIS — K317 Polyp of stomach and duodenum: Secondary | ICD-10-CM | POA: Diagnosis not present

## 2018-04-23 DIAGNOSIS — E785 Hyperlipidemia, unspecified: Secondary | ICD-10-CM | POA: Diagnosis not present

## 2018-04-23 DIAGNOSIS — Z7902 Long term (current) use of antithrombotics/antiplatelets: Secondary | ICD-10-CM | POA: Diagnosis not present

## 2018-04-23 DIAGNOSIS — I10 Essential (primary) hypertension: Secondary | ICD-10-CM | POA: Diagnosis not present

## 2018-04-23 DIAGNOSIS — Z87891 Personal history of nicotine dependence: Secondary | ICD-10-CM | POA: Diagnosis not present

## 2018-04-23 DIAGNOSIS — Z888 Allergy status to other drugs, medicaments and biological substances status: Secondary | ICD-10-CM | POA: Diagnosis not present

## 2018-04-23 DIAGNOSIS — Z79899 Other long term (current) drug therapy: Secondary | ICD-10-CM | POA: Diagnosis not present

## 2018-04-23 DIAGNOSIS — Z955 Presence of coronary angioplasty implant and graft: Secondary | ICD-10-CM | POA: Diagnosis not present

## 2018-04-23 DIAGNOSIS — K219 Gastro-esophageal reflux disease without esophagitis: Secondary | ICD-10-CM | POA: Diagnosis not present

## 2018-04-23 DIAGNOSIS — I2510 Atherosclerotic heart disease of native coronary artery without angina pectoris: Secondary | ICD-10-CM | POA: Diagnosis not present

## 2018-04-23 DIAGNOSIS — R011 Cardiac murmur, unspecified: Secondary | ICD-10-CM | POA: Diagnosis not present

## 2018-04-23 DIAGNOSIS — Z7982 Long term (current) use of aspirin: Secondary | ICD-10-CM | POA: Diagnosis not present

## 2018-04-23 DIAGNOSIS — Z91013 Allergy to seafood: Secondary | ICD-10-CM | POA: Diagnosis not present

## 2018-04-23 DIAGNOSIS — Z880 Allergy status to penicillin: Secondary | ICD-10-CM | POA: Diagnosis not present

## 2018-04-23 DIAGNOSIS — Z881 Allergy status to other antibiotic agents status: Secondary | ICD-10-CM | POA: Diagnosis not present

## 2018-04-23 DIAGNOSIS — Z8249 Family history of ischemic heart disease and other diseases of the circulatory system: Secondary | ICD-10-CM | POA: Diagnosis not present

## 2018-04-23 DIAGNOSIS — Z841 Family history of disorders of kidney and ureter: Secondary | ICD-10-CM | POA: Diagnosis not present

## 2018-04-23 DIAGNOSIS — I4891 Unspecified atrial fibrillation: Secondary | ICD-10-CM | POA: Diagnosis not present

## 2018-04-23 DIAGNOSIS — I739 Peripheral vascular disease, unspecified: Secondary | ICD-10-CM | POA: Diagnosis not present

## 2018-05-02 ENCOUNTER — Other Ambulatory Visit: Payer: Self-pay

## 2018-05-02 ENCOUNTER — Emergency Department (HOSPITAL_COMMUNITY): Payer: Medicare Other

## 2018-05-02 ENCOUNTER — Encounter: Payer: Self-pay | Admitting: Emergency Medicine

## 2018-05-02 ENCOUNTER — Encounter (HOSPITAL_COMMUNITY): Payer: Self-pay | Admitting: Emergency Medicine

## 2018-05-02 ENCOUNTER — Emergency Department (HOSPITAL_COMMUNITY)
Admission: EM | Admit: 2018-05-02 | Discharge: 2018-05-02 | Disposition: A | Discharge status: Home / Self Care | Payer: Medicare Other | Attending: Emergency Medicine | Admitting: Emergency Medicine

## 2018-05-02 ENCOUNTER — Emergency Department (INDEPENDENT_AMBULATORY_CARE_PROVIDER_SITE_OTHER)
Admission: EM | Admit: 2018-05-02 | Discharge: 2018-05-02 | Disposition: A | Discharge status: Home / Self Care | Payer: Medicare Other | Source: Home / Self Care | Attending: Family Medicine | Admitting: Family Medicine

## 2018-05-02 DIAGNOSIS — Z7982 Long term (current) use of aspirin: Secondary | ICD-10-CM | POA: Insufficient documentation

## 2018-05-02 DIAGNOSIS — Z7901 Long term (current) use of anticoagulants: Secondary | ICD-10-CM | POA: Insufficient documentation

## 2018-05-02 DIAGNOSIS — R109 Unspecified abdominal pain: Secondary | ICD-10-CM | POA: Diagnosis not present

## 2018-05-02 DIAGNOSIS — K5732 Diverticulitis of large intestine without perforation or abscess without bleeding: Secondary | ICD-10-CM | POA: Insufficient documentation

## 2018-05-02 DIAGNOSIS — N183 Chronic kidney disease, stage 3 (moderate): Secondary | ICD-10-CM | POA: Insufficient documentation

## 2018-05-02 DIAGNOSIS — Z87891 Personal history of nicotine dependence: Secondary | ICD-10-CM | POA: Insufficient documentation

## 2018-05-02 DIAGNOSIS — I129 Hypertensive chronic kidney disease with stage 1 through stage 4 chronic kidney disease, or unspecified chronic kidney disease: Secondary | ICD-10-CM | POA: Insufficient documentation

## 2018-05-02 DIAGNOSIS — R1084 Generalized abdominal pain: Secondary | ICD-10-CM | POA: Diagnosis present

## 2018-05-02 DIAGNOSIS — K5900 Constipation, unspecified: Secondary | ICD-10-CM

## 2018-05-02 DIAGNOSIS — I2510 Atherosclerotic heart disease of native coronary artery without angina pectoris: Secondary | ICD-10-CM | POA: Insufficient documentation

## 2018-05-02 DIAGNOSIS — Z79899 Other long term (current) drug therapy: Secondary | ICD-10-CM | POA: Insufficient documentation

## 2018-05-02 LAB — CBC WITH DIFFERENTIAL/PLATELET
Abs Immature Granulocytes: 0.02 10*3/uL (ref 0.00–0.07)
BASOS ABS: 0 10*3/uL (ref 0.0–0.1)
Basophils Relative: 0 %
EOS ABS: 0 10*3/uL (ref 0.0–0.5)
EOS PCT: 0 %
HCT: 38.6 % (ref 36.0–46.0)
Hemoglobin: 12.6 g/dL (ref 12.0–15.0)
IMMATURE GRANULOCYTES: 0 %
Lymphocytes Relative: 10 %
Lymphs Abs: 1.1 10*3/uL (ref 0.7–4.0)
MCH: 29.2 pg (ref 26.0–34.0)
MCHC: 32.6 g/dL (ref 30.0–36.0)
MCV: 89.6 fL (ref 80.0–100.0)
Monocytes Absolute: 0.7 10*3/uL (ref 0.1–1.0)
Monocytes Relative: 7 %
NEUTROS PCT: 83 %
NRBC: 0 % (ref 0.0–0.2)
Neutro Abs: 8.8 10*3/uL — ABNORMAL HIGH (ref 1.7–7.7)
Platelets: 273 10*3/uL (ref 150–400)
RBC: 4.31 MIL/uL (ref 3.87–5.11)
RDW: 11.5 % (ref 11.5–15.5)
WBC: 10.6 10*3/uL — AB (ref 4.0–10.5)

## 2018-05-02 LAB — COMPREHENSIVE METABOLIC PANEL
ALBUMIN: 3.7 g/dL (ref 3.5–5.0)
ALT: 16 U/L (ref 0–44)
ANION GAP: 8 (ref 5–15)
AST: 23 U/L (ref 15–41)
Alkaline Phosphatase: 58 U/L (ref 38–126)
BILIRUBIN TOTAL: 0.9 mg/dL (ref 0.3–1.2)
BUN: 11 mg/dL (ref 8–23)
CHLORIDE: 102 mmol/L (ref 98–111)
CO2: 24 mmol/L (ref 22–32)
Calcium: 8.4 mg/dL — ABNORMAL LOW (ref 8.9–10.3)
Creatinine, Ser: 0.93 mg/dL (ref 0.44–1.00)
GFR calc Af Amer: >60 mL/min (ref >60)
GFR calc non Af Amer: >60 mL/min (ref >60)
GLUCOSE: 121 mg/dL — AB (ref 70–99)
POTASSIUM: 4.4 mmol/L (ref 3.5–5.1)
SODIUM: 134 mmol/L — AB (ref 135–145)
TOTAL PROTEIN: 7 g/dL (ref 6.5–8.1)

## 2018-05-02 LAB — LIPASE, BLOOD: Lipase: 28 U/L (ref 11–51)

## 2018-05-02 LAB — POCT URINALYSIS DIP (MANUAL ENTRY)
BILIRUBIN UA: NEGATIVE
BILIRUBIN UA: NEGATIVE mg/dL
GLUCOSE UA: NEGATIVE mg/dL
Leukocytes, UA: NEGATIVE
NITRITE UA: NEGATIVE
PH UA: 6 (ref 5.0–8.0)
Protein Ur, POC: 30 mg/dL — AB
Spec Grav, UA: 1.015 (ref 1.010–1.025)
Urobilinogen, UA: 0.2 E.U./dL

## 2018-05-02 LAB — I-STAT CG4 LACTIC ACID, ED: Lactic Acid, Venous: 1.28 mmol/L (ref 0.5–1.9)

## 2018-05-02 MED ORDER — METOCLOPRAMIDE HCL 5 MG/ML IJ SOLN
5.0000 mg | Freq: Once | INTRAMUSCULAR | Status: AC
  Administered 2018-05-02: 5 mg via INTRAVENOUS
  Filled 2018-05-02: qty 2

## 2018-05-02 MED ORDER — HYDROCODONE-ACETAMINOPHEN 5-325 MG PO TABS: 1.0000 | ORAL_TABLET | Freq: Four times a day (QID) | ORAL | 0 refills | Status: DC | PRN

## 2018-05-02 MED ORDER — METRONIDAZOLE 500 MG PO TABS
500.0000 mg | ORAL_TABLET | Freq: Once | ORAL | Status: AC
  Administered 2018-05-02: 500 mg via ORAL
  Filled 2018-05-02: qty 1

## 2018-05-02 MED ORDER — ONDANSETRON 4 MG PO TBDP
4.0000 mg | ORAL_TABLET | Freq: Once | ORAL | Status: AC
  Administered 2018-05-02: 4 mg via ORAL

## 2018-05-02 MED ORDER — CIPROFLOXACIN HCL 500 MG PO TABS: 500.0000 mg | ORAL_TABLET | Freq: Two times a day (BID) | ORAL | 0 refills | Status: DC

## 2018-05-02 MED ORDER — CIPROFLOXACIN HCL 500 MG PO TABS
500.0000 mg | ORAL_TABLET | Freq: Once | ORAL | Status: AC
  Administered 2018-05-02: 500 mg via ORAL
  Filled 2018-05-02: qty 1

## 2018-05-02 MED ORDER — ACETAMINOPHEN 325 MG PO TABS
650.0000 mg | ORAL_TABLET | Freq: Once | ORAL | Status: AC
  Administered 2018-05-02: 650 mg via ORAL
  Filled 2018-05-02: qty 2

## 2018-05-02 MED ORDER — METOCLOPRAMIDE HCL 10 MG PO TABS: 10.0000 mg | ORAL_TABLET | Freq: Four times a day (QID) | ORAL | 0 refills | Status: DC | PRN

## 2018-05-02 MED ORDER — METRONIDAZOLE 500 MG PO TABS: 500.0000 mg | ORAL_TABLET | Freq: Two times a day (BID) | ORAL | 0 refills | Status: DC

## 2018-05-02 NOTE — Discharge Instructions (Addendum)
Tylenol for mild pain or the pain medicine prescribed for bad pain.  Do not take Tylenol together with the pain medicine prescribed as the combination can be dangerous to your liver.  Also do not take the pain medicine prescribed together with Tranxene (clorazepate), as the combination can interfere with your breathing.  Call Dr. Georgina Snell to be seen in the office if you are not improving by next week.  Return to the emergency department if your pain is not well controlled if you develop vomiting or fever with temperature higher than 100.4, or if you feel that your condition is worsening for any reason

## 2018-05-02 NOTE — ED Triage Notes (Signed)
Here with mid abdominal stabbing pain radiating to lower back x2 days. Fever, nausea and freq/urge. Tried Tylenol for relief

## 2018-05-02 NOTE — ED Notes (Signed)
Pt ambulatory to bathroom

## 2018-05-02 NOTE — ED Triage Notes (Signed)
Pt here from urgent care,  c/o abdominal cramping and tenderness. hx mesenteric aortic bypass.

## 2018-05-02 NOTE — Discharge Instructions (Signed)
°  You have declined EMS transport but it is still advised that you please go to the hospital right now for further evaluation and treatment of your severe pain.  Do not eat or drink anything along the way in case the need to do surgery.

## 2018-05-02 NOTE — ED Notes (Signed)
Pt declining pain medicine at this time.

## 2018-05-02 NOTE — ED Provider Notes (Signed)
[name]    CSN: [id number] Arrival date & time: 05/02/18  3570     History   Chief Complaint Chief Complaint  Patient presents with  . Dysuria    HPI Traci [name] is a 72 y.o. female.   HPI Traci [name] is a 72 y.o. female presenting to UC with hx of mesenteric ischemia and sigmoid diverticulitis c/o 2-3 days of worsening lower abdominal pain radiating to her back. Pain is severe, twisting and stabbing pain, "it's as bad as labor pain."  Associated fever, nausea, urinary urgency and frequency.  She had an endoscopy 1 week ago, a polyp was removed and 3 clamps were placed.  She was advised she may be a little sore but pain is severe and lower than she expects it would be if it was related to the clamps.  She has taken Tylenol w/o relief.  She had surgery for mesenteric ischemia about 3-4 years ago at [facility].  She notes she has not been able to pass gas or have a BM for the last 3-4 days.    Past Medical History:  Diagnosis Date  . Anxiety   . Atrial fibrillation ([place])   . CAD (coronary artery disease)   . Claudication ([place]) 01/06/2008   Lower extremity dopplers - no evidence of arterial insufficiency, normal exam  . Coronary artery disease   . GERD (gastroesophageal reflux disease)   . Hyperlipidemia   . Hypertension 11/30/2010   echo- EF 55%; normal w/ mildly sclerotic aortic valve  . Hypertension 11/05/11   renal dopplers - celiac artery and SMA >50% diameter reduction, R renal artery - mildly elevated velocities 1-59% diameter reduction, L renal artery normal  . Nonspecific ST-T wave electrocardiographic changes 03/26/2011   R/Lmv - EF 74%, normal perfusion all regions, ST depression w/ Lexiscan infusion w/o assoc angina  . Osteopenia   . PAD (peripheral artery disease) ([place])   . Peripheral vascular disease ([place])   . PVD (peripheral vascular disease) ([place]) 11/05/2011   doppler - R/L brachial pressures essentially equal w/o inflow  disease; L sublclavian/CCA bypass graft demonstrates patent flow, no evidence of significant stenosis  . Sigmoid diverticulitis     Patient Active Problem List   Diagnosis Date Noted  . Atherosclerosis of aorta ([place]) 01/04/2018  . Osteoporosis 12/25/2017  . CKD (chronic kidney disease) stage 3, GFR 30-59 ml/min (HCC) 08/04/2017  . Primary osteoarthritis of first carpometacarpal joint of left hand 07/10/2017  . Strain of right Achilles tendon 04/14/2017  . Protein-calorie malnutrition ([place]) 08/29/2016  . Atypical nevus 08/22/2016  . Acute bilateral low back pain with left-sided sciatica 06/06/2016  . Osteopenia 12/13/2015  . Menopause 12/08/2015  . History of colonoscopy 07/17/2015  . Irregular heartbeat 07/26/2014  . 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 Preoperative evaluation of a medical condition to rule out surgical contraindications (TAR required) 01/17/2014  . 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[place]) 01/16/2014  . Hyperlipidemia 04/05/2013  . Pre-syncope 12/06/2012  . Hypertension 12/06/2012  . Peripheral arterial disease ([place]) 12/06/2012  . CAD, cath 2009 12/06/2012  . Hyponatremia, improved holding diuretic 12/06/2012  . Atherosclerosis of other specified arteries 04/02/2012  . 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 Stricture of artery ([place]) 10/02/2011    Past Surgical History:  Procedure Laterality Date  . ABDOMINAL AORTIC ANEURYSM REPAIR N/A 01/27/2014   Procedure: AORTO-SUPERIOR MESENTERIC ARTERY BYPASS GRAFT;  Surgeon: Angelia Mould, MD;  Location: [facility];  Service: Vascular;  Laterality: N/A;  .  ABDOMINAL HYSTERECTOMY    . APPENDECTOMY    . CARDIAC CATHETERIZATION  06/03/2008   60% LAD, involving D2, borderline significant by IVUS, medical therapy. CFX, RCA OK.  Marland Kitchen CATARACT EXTRACTION    . CHOLECYSTECTOMY    . EYE SURGERY     retinal surgery  . SPINE SURGERY  Feb. 2011  . [employer]   X's  several  . SUBCLAVIAN ARTERY STENT Left  09/20/2008   LSA ISR 7x43mm Cordis Genesis on Opta premount, reduction from 80% to 0%  . subclavian artery stents  01/23/2010   Left carotid to subclavian artery Bypass  . VISCERAL ANGIOGRAM  01/17/2014   Procedure: VISCERAL ANGIOGRAM;  Surgeon: Angelia Mould, MD;  Location: [facility] CATH LAB;  Service: Cardiovascular;;    OB History   None      Home Medications    Prior to Admission medications   Medication Sig Start Date End Date Taking? Authorizing Provider  aspirin EC 81 MG tablet Take 81 mg by mouth daily.    [provider]  atenolol (TENORMIN) 25 MG tablet Take 1 tablet (25 mg total) by mouth daily. 01/30/18   Croitoru, Mihai, MD  atorvastatin (LIPITOR) 10 MG tablet Take 1 tablet (10 mg total) daily at 6 PM by mouth. 04/28/17   Croitoru, Dani Gobble, MD  candesartan (ATACAND) 16 MG tablet Take 0.5 tablets (8 mg total) daily by mouth. 04/28/17   Croitoru, Mihai, MD  cholecalciferol (VITAMIN D) 1000 UNITS tablet Take 1,000 Units by mouth daily.    [provider]  clopidogrel (PLAVIX) 75 MG tablet Take 1 tablet (75 mg total) daily by mouth. 04/28/17   Croitoru, Mihai, MD  clorazepate (TRANXENE) 7.5 MG tablet Take 1 tablet (7.5 mg total) by mouth daily as needed. 01/20/18   Emeterio Reeve, DO  cyanocobalamin 1000 MCG tablet Take 100 mcg by mouth daily.    [provider]  EPINEPHrine (EPIPEN 2-PAK) 0.3 mg/0.3 mL IJ SOAJ injection Inject 0.3 mLs (0.3 mg total) into the muscle as needed (for allergic reaction). 12/23/17   Gregor Hams, MD  esomeprazole (NEXIUM) 40 MG capsule Take 1 capsule (40 mg total) by mouth daily. Take 1 tab daily Patient taking differently: Take 40 mg by mouth daily.  02/23/15   Croitoru, Mihai, MD  estradiol (ESTRACE) 0.5 MG tablet TAKE 1 TABLET DAILY 12/22/17   Gregor Hams, MD  furosemide (LASIX) 20 MG tablet TAKE 1 TABLET EVERY OTHER DAY 03/31/17   Croitoru, Mihai, MD  linaclotide (LINZESS) 145 MCG CAPS capsule Take 1 capsule by mouth daily as  needed. 12/22/15   [provider]  meclizine (ANTIVERT) 25 MG tablet TAKE ONE-HALF (1/2) TABLET THREE TIMES A DAY AS NEEDED FOR DIZZINESS OR NAUSEA 12/22/17   Gregor Hams, MD  Multiple Vitamin (MULTIVITAMIN WITH MINERALS) TABS Take 1 tablet by mouth daily.    [provider]  sucralfate (CARAFATE) 1 g tablet Take 1 tablet (1 g total) by mouth 4 (four) times daily. Patient taking differently: Take 1 g by mouth 4 (four) times daily as needed (nausea/abd pain).  02/03/18   Gregor Hams, MD  zolpidem (AMBIEN) 10 MG tablet Take 1 tablet (10 mg total) by mouth at bedtime as needed for sleep. 12/26/17   Gregor Hams, MD    Family History Family History  Problem Relation Age of Onset  . Heart disease Father 11       Heart Disease before age 50  .  Kidney disease Father   . Heart attack Father   . Hyperlipidemia Father   . Hypertension Father   . Kidney failure Mother   . Diabetes Maternal Grandmother   . Heart disease Paternal Grandfather     Social History Social History   Tobacco Use  . Smoking status: Former Smoker    Packs/day: 0.25    Years: 20.00    Pack years: 5.00    Types: Cigarettes    Last attempt to quit: 09/30/1993    Years since quitting: 24.6  . Smokeless tobacco: Never Used  Substance Use Topics  . Alcohol use: No  . Drug use: No     Allergies   Cortisone; Dilaudid [hydromorphone hcl]; Iodine; Medrol [methylprednisolone]; Omnipaque [iohexol]; Prednisone; Shellfish allergy; Sulfa drugs cross reactors; Augmentin [amoxicillin-pot clavulanate]; Codeine; Erythromycin; and Morphine and related   Review of Systems Review of Systems  Constitutional: Positive for appetite change and fever. Negative for chills.  Gastrointestinal: Positive for abdominal pain, constipation and nausea. Negative for diarrhea and vomiting.  Genitourinary: Positive for frequency and urgency. Negative for dysuria and pelvic pain.  Musculoskeletal: Positive for back pain.  Negative for myalgias.     Physical Exam Triage Vital Signs ED Triage Vitals  Enc Vitals Group     BP 05/02/18 0952 107/66     Pulse --      Resp --      Temp 05/02/18 0952 98.7 F (37.1 C)     Temp Source 05/02/18 0952 Oral     SpO2 05/02/18 0952 99 %     Weight 05/02/18 0954 148 lb 12.8 oz (67.5 kg)     Height --      Head Circumference --      Peak Flow --      Pain Score 05/02/18 0953 9     Pain Loc --      Pain Edu? --      Excl. in [place]? --    No data found.  Updated Vital Signs BP 107/66 (BP Location: Left Arm)   Temp 98.7 F (37.1 C) (Oral)   Wt 148 lb 12.8 oz (67.5 kg)   SpO2 99%   BMI 25.95 kg/m   Visual Acuity Right Eye Distance:   Left Eye Distance:   Bilateral Distance:    Right Eye Near:   Left Eye Near:    Bilateral Near:     Physical Exam  Constitutional: She is oriented to person, place, and time. She appears well-developed and well-nourished. No distress.  HENT:  Head: Normocephalic and atraumatic.  Mouth/Throat: Oropharynx is clear and moist.  Eyes: EOM are normal.  Neck: Normal range of motion.  Cardiovascular: Normal rate and regular rhythm.  Pulmonary/Chest: Effort normal and breath sounds normal. No respiratory distress.  Abdominal: Soft. She exhibits no distension and no mass. There is tenderness. There is guarding.  Well healed vertical surgical scar.  Diffuse tenderness with guarding from mid to lower abdomen on exam.   Musculoskeletal: Normal range of motion.  Neurological: She is alert and oriented to person, place, and time.  Skin: Skin is warm and dry. She is not diaphoretic.  Psychiatric: She has a normal mood and affect. Her behavior is normal.  Nursing note and vitals reviewed.    UC Treatments / Results  Labs (all labs ordered are listed, but only abnormal results are displayed) Labs Reviewed  POCT URINALYSIS DIP (MANUAL ENTRY) - Abnormal; Notable for the following components:      Result  Value   Blood, UA  trace-intact (*)    Protein Ur, POC =30 (*)    All other components within normal limits  URINE CULTURE    EKG None  Radiology No results found.  Procedures Procedures (including critical care time)  Medications Ordered in UC Medications  ondansetron (ZOFRAN-ODT) disintegrating tablet 4 mg (4 mg Oral Given 05/02/18 1034)    Initial Impression / Assessment and Plan / UC Course  I have reviewed the triage vital signs and the nursing notes.  Pertinent labs & imaging results that were available during my care of the patient were reviewed by me and considered in my medical decision making (see chart for details).     Concern for possible SBO or recurrent mesenteric ischemia  Recommend pt go to ED for further evaluation and treatment. Offered to call EMS, pt declined. She notes she feels comfortable driving herself to [facility].   Final Clinical Impressions(s) / UC Diagnoses   Final diagnoses:  Continuous severe abdominal pain  Constipation, unspecified constipation type     Discharge Instructions      You have declined EMS transport but it is still advised that you please go to the hospital right now for further evaluation and treatment of your severe pain.  Do not eat or drink anything along the way in case the need to do surgery.     ED Prescriptions    None     Controlled Substance Prescriptions [place] Controlled Substance Registry consulted? Not Applicable   Noe Gens, PA-C 05/02/18 1045

## 2018-05-02 NOTE — ED Notes (Signed)
Discharge instructions discussed with Pt. Pt verbalized understanding. Pt stable and ambulatory.    

## 2018-05-02 NOTE — ED Provider Notes (Signed)
[facility] EMERGENCY DEPARTMENT Provider Note   CSN: [id number] Arrival date & time: 05/02/18  1103     History   Chief Complaint No chief complaint on file.   HPI Traci [name] is a 72 y.o. female.  HPI Planes of generalized abdominal pain onset 4 days ago with nausea.  No vomiting.  She has not passed gas or had a bowel movement in the past 4 days.  She reports temperature of 101 degrees yesterday.  She treated herself with Tylenol last dose 4 AM today without relief.  Eyes any urinary symptoms describes pain as constant, with exacerbations better severely crampy.  Nothing makes symptoms better or worse.  She was seen at urgent care center prior to coming here given Zofran ODT for nausea prior to coming here with resolution of nausea she denies nausea presently.  Nothing makes symptoms better or worse.  She denies urinary symptoms.  No other associated symptoms Past Medical History:  Diagnosis Date  . Anxiety   . Atrial fibrillation ([place])   . CAD (coronary artery disease)   . Claudication ([place]) 01/06/2008   Lower extremity dopplers - no evidence of arterial insufficiency, normal exam  . Coronary artery disease   . GERD (gastroesophageal reflux disease)   . Hyperlipidemia   . Hypertension 11/30/2010   echo- EF 55%; normal w/ mildly sclerotic aortic valve  . Hypertension 11/05/11   renal dopplers - celiac artery and SMA >50% diameter reduction, R renal artery - mildly elevated velocities 1-59% diameter reduction, L renal artery normal  . Nonspecific ST-T wave electrocardiographic changes 03/26/2011   R/Lmv - EF 74%, normal perfusion all regions, ST depression w/ Lexiscan infusion w/o assoc angina  . Osteopenia   . PAD (peripheral artery disease) ([place])   . Peripheral vascular disease ([place])   . PVD (peripheral vascular disease) ([place]) 11/05/2011   doppler - R/L brachial pressures essentially equal w/o inflow disease; L sublclavian/CCA bypass graft  demonstrates patent flow, no evidence of significant stenosis  . Sigmoid diverticulitis   Patient denies history of atrial fibrillation. she reports that her cardiologist told her that she does not have atrial fibrillation  Patient Active Problem List   Diagnosis Date Noted  . Atherosclerosis of aorta ([place]) 01/04/2018  . Osteoporosis 12/25/2017  . CKD (chronic kidney disease) stage 3, GFR 30-59 ml/min (HCC) 08/04/2017  . Primary osteoarthritis of first carpometacarpal joint of left hand 07/10/2017  . Strain of right Achilles tendon 04/14/2017  . Protein-calorie malnutrition ([place]) 08/29/2016  . Atypical nevus 08/22/2016  . Acute bilateral low back pain with left-sided sciatica 06/06/2016  . Osteopenia 12/13/2015  . Menopause 12/08/2015  . History of colonoscopy 07/17/2015  . Irregular heartbeat 07/26/2014  . 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 Preoperative evaluation of a medical condition to rule out surgical contraindications (TAR required) 01/17/2014  . 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[place]) 01/16/2014  . Hyperlipidemia 04/05/2013  . Pre-syncope 12/06/2012  . Hypertension 12/06/2012  . Peripheral arterial disease ([place]) 12/06/2012  . CAD, cath 2009 12/06/2012  . Hyponatremia, improved holding diuretic 12/06/2012  . Atherosclerosis of other specified arteries 04/02/2012  . 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 Stricture of artery ([place]) 10/02/2011    Past Surgical History:  Procedure Laterality Date  . ABDOMINAL AORTIC ANEURYSM REPAIR N/A 01/27/2014   Procedure: AORTO-SUPERIOR MESENTERIC ARTERY BYPASS GRAFT;  Surgeon: Angelia Mould, MD;  Location: [facility];  Service: Vascular;  Laterality: N/A;  . ABDOMINAL HYSTERECTOMY    . APPENDECTOMY    .  CARDIAC CATHETERIZATION  06/03/2008   60% LAD, involving D2, borderline significant by IVUS, medical therapy. CFX, RCA OK.  Marland Kitchen CATARACT EXTRACTION    . CHOLECYSTECTOMY    . EYE SURGERY     retinal surgery  . SPINE SURGERY  Feb.  2011  . [place]   X's  several  . SUBCLAVIAN ARTERY STENT Left 09/20/2008   LSA ISR 7x33mm Cordis Genesis on Opta premount, reduction from 80% to 0%  . subclavian artery stents  01/23/2010   Left carotid to subclavian artery Bypass  . VISCERAL ANGIOGRAM  01/17/2014   Procedure: VISCERAL ANGIOGRAM;  Surgeon: Angelia Mould, MD;  Location: [facility] CATH LAB;  Service: Cardiovascular;;     OB History   None      Home Medications    Prior to Admission medications   Medication Sig Start Date End Date Taking? Authorizing Provider  aspirin EC 81 MG tablet Take 81 mg by mouth daily.    [provider]  atenolol (TENORMIN) 25 MG tablet Take 1 tablet (25 mg total) by mouth daily. 01/30/18   Croitoru, Mihai, MD  atorvastatin (LIPITOR) 10 MG tablet Take 1 tablet (10 mg total) daily at 6 PM by mouth. 04/28/17   Croitoru, Dani Gobble, MD  candesartan (ATACAND) 16 MG tablet Take 0.5 tablets (8 mg total) daily by mouth. 04/28/17   Croitoru, Mihai, MD  cholecalciferol (VITAMIN D) 1000 UNITS tablet Take 1,000 Units by mouth daily.    [provider]  clopidogrel (PLAVIX) 75 MG tablet Take 1 tablet (75 mg total) daily by mouth. 04/28/17   Croitoru, Mihai, MD  clorazepate (TRANXENE) 7.5 MG tablet Take 1 tablet (7.5 mg total) by mouth daily as needed. 01/20/18   Emeterio Reeve, DO  cyanocobalamin 1000 MCG tablet Take 100 mcg by mouth daily.    [provider]  EPINEPHrine (EPIPEN 2-PAK) 0.3 mg/0.3 mL IJ SOAJ injection Inject 0.3 mLs (0.3 mg total) into the muscle as needed (for allergic reaction). 12/23/17   Gregor Hams, MD  esomeprazole (NEXIUM) 40 MG capsule Take 1 capsule (40 mg total) by mouth daily. Take 1 tab daily Patient taking differently: Take 40 mg by mouth daily.  02/23/15   Croitoru, Mihai, MD  estradiol (ESTRACE) 0.5 MG tablet TAKE 1 TABLET DAILY 12/22/17   Gregor Hams, MD  furosemide (LASIX) 20 MG tablet TAKE 1 TABLET EVERY OTHER DAY 03/31/17    Croitoru, Mihai, MD  linaclotide (LINZESS) 145 MCG CAPS capsule Take 1 capsule by mouth daily as needed. 12/22/15   [provider]  meclizine (ANTIVERT) 25 MG tablet TAKE ONE-HALF (1/2) TABLET THREE TIMES A DAY AS NEEDED FOR DIZZINESS OR NAUSEA 12/22/17   Gregor Hams, MD  Multiple Vitamin (MULTIVITAMIN WITH MINERALS) TABS Take 1 tablet by mouth daily.    [provider]  sucralfate (CARAFATE) 1 g tablet Take 1 tablet (1 g total) by mouth 4 (four) times daily. Patient taking differently: Take 1 g by mouth 4 (four) times daily as needed (nausea/abd pain).  02/03/18   Gregor Hams, MD  zolpidem (AMBIEN) 10 MG tablet Take 1 tablet (10 mg total) by mouth at bedtime as needed for sleep. 12/26/17   Gregor Hams, MD    Family History Family History  Problem Relation Age of Onset  . Heart disease Father 49       Heart Disease before age 80  . Kidney disease Father   . Heart attack Father   .  Hyperlipidemia Father   . Hypertension Father   . Kidney failure Mother   . Diabetes Maternal Grandmother   . Heart disease Paternal Grandfather     Social History Social History   Tobacco Use  . Smoking status: Former Smoker    Packs/day: 0.25    Years: 20.00    Pack years: 5.00    Types: Cigarettes    Last attempt to quit: 09/30/1993    Years since quitting: 24.6  . Smokeless tobacco: Never Used  Substance Use Topics  . Alcohol use: No  . Drug use: No     Allergies   Cortisone; Dilaudid [hydromorphone hcl]; Iodine; Medrol [methylprednisolone]; Omnipaque [iohexol]; Prednisone; Shellfish allergy; Sulfa drugs cross reactors; Augmentin [amoxicillin-pot clavulanate]; Codeine; Erythromycin; and Morphine and related   Review of Systems Review of Systems  Constitutional: Positive for fever.  HENT: Negative.   Respiratory: Negative.   Cardiovascular: Negative.   Gastrointestinal: Positive for abdominal pain and nausea.  Musculoskeletal: Negative.   Skin: Negative.     Neurological: Negative.   Psychiatric/Behavioral: Negative.   All other systems reviewed and are negative.    Physical Exam Updated Vital Signs There were no vitals taken for this visit.  Physical Exam  Constitutional: She appears well-developed and well-nourished.  HENT:  Head: Normocephalic and atraumatic.  Eyes: Pupils are equal, round, and reactive to light. Conjunctivae are normal.  Neck: Neck supple. No tracheal deviation present. No thyromegaly present.  Cardiovascular: Normal rate and regular rhythm.  No murmur heard. Pulmonary/Chest: Effort normal and breath sounds normal.  Abdominal: She exhibits no distension. There is tenderness. There is guarding.  Bowel sounds diminished diffusely tender more so at lower quadrants.  Voluntary guarding  Musculoskeletal: Normal range of motion. She exhibits no edema or tenderness.  Neurological: She is alert. Coordination normal.  Skin: Skin is warm and dry. No rash noted.  Psychiatric: She has a normal mood and affect.  Nursing note and vitals reviewed.    ED Treatments / Results  Labs (all labs ordered are listed, but only abnormal results are displayed) Labs Reviewed - No data to display  EKG None  Radiology No results found.  Procedures Procedures (including critical care time)  Medications Ordered in ED Medications - No data to display Results for orders placed or performed during the hospital encounter of 05/02/18  CBC with Differential/Platelet  Result Value Ref Range   WBC 10.6 (H) 4.0 - 10.5 K/uL   RBC 4.31 3.87 - 5.11 MIL/uL   Hemoglobin 12.6 12.0 - 15.0 g/dL   HCT 38.6 36.0 - 46.0 %   MCV 89.6 80.0 - 100.0 fL   MCH 29.2 26.0 - 34.0 pg   MCHC 32.6 30.0 - 36.0 g/dL   RDW 11.5 11.5 - 15.5 %   Platelets 273 150 - 400 K/uL   nRBC 0.0 0.0 - 0.2 %   Neutrophils Relative % 83 %   Neutro Abs 8.8 (H) 1.7 - 7.7 K/uL   Lymphocytes Relative 10 %   Lymphs Abs 1.1 0.7 - 4.0 K/uL   Monocytes Relative 7 %    Monocytes Absolute 0.7 0.1 - 1.0 K/uL   Eosinophils Relative 0 %   Eosinophils Absolute 0.0 0.0 - 0.5 K/uL   Basophils Relative 0 %   Basophils Absolute 0.0 0.0 - 0.1 K/uL   Immature Granulocytes 0 %   Abs Immature Granulocytes 0.02 0.00 - 0.07 K/uL  Comprehensive metabolic panel  Result Value Ref Range   Sodium 134 (L) 135 -  145 mmol/L   Potassium 4.4 3.5 - 5.1 mmol/L   Chloride 102 98 - 111 mmol/L   CO2 24 22 - 32 mmol/L   Glucose, Bld 121 (H) 70 - 99 mg/dL   BUN 11 8 - 23 mg/dL   Creatinine, Ser 0.93 0.44 - 1.00 mg/dL   Calcium 8.4 (L) 8.9 - 10.3 mg/dL   Total Protein 7.0 6.5 - 8.1 g/dL   Albumin 3.7 3.5 - 5.0 g/dL   AST 23 15 - 41 U/L   ALT 16 0 - 44 U/L   Alkaline Phosphatase 58 38 - 126 U/L   Total Bilirubin 0.9 0.3 - 1.2 mg/dL   GFR calc non Af Amer >60 >60 mL/min   GFR calc Af Amer >60 >60 mL/min   Anion gap 8 5 - 15  Lipase, blood  Result Value Ref Range   Lipase 28 11 - 51 U/L  I-Stat CG4 Lactic Acid, ED  Result Value Ref Range   Lactic Acid, Venous 1.28 0.5 - 1.9 mmol/L   Ct Abdomen Pelvis Wo Contrast  Result Date: 05/02/2018 CLINICAL DATA:  Worsening abdominal pain, history of diverticulitis EXAM: CT ABDOMEN AND PELVIS WITHOUT CONTRAST TECHNIQUE: Multidetector CT imaging of the abdomen and pelvis was performed following the standard protocol without IV contrast. COMPARISON:  01/14/2017 FINDINGS: Lower chest: No acute abnormality. Hepatobiliary: No focal liver abnormality is seen. Status post cholecystectomy. No biliary dilatation. Pancreas: Unremarkable. No pancreatic ductal dilatation or surrounding inflammatory changes. Spleen: Normal in size without focal abnormality. Adrenals/Urinary Tract: Adrenal glands are within normal limits. The kidneys are well visualized bilaterally. No renal calculi or urinary tract obstructive changes are seen. Bladder is decompressed. Stomach/Bowel: Wall thickening and pericolonic inflammatory changes are noted in the sigmoid colon  consistent with diverticulitis. No abscess or perforation is identified. No proximal obstructive changes are seen. Stomach is within normal limits with the exception of a small sliding-type hiatal hernia. Vascular/Lymphatic: Aortic atherosclerosis. No enlarged abdominal or pelvic lymph nodes. Reproductive: Status post hysterectomy. No adnexal masses. Other: No abdominal wall hernia or abnormality. No abdominopelvic ascites. Musculoskeletal: Degenerative and postoperative changes in the lumbar spine are seen. Prior fusion is noted at L5-S1. IMPRESSION: Changes consistent with sigmoid diverticulitis deep within the pelvis. Electronically Signed   By: Inez Catalina M.D.   On: 05/02/2018 15:32    Initial Impression / Assessment and Plan / ED Course  I have reviewed the triage vital signs and the nursing notes.  Pertinent labs & imaging results that were available during my care of the patient were reviewed by me and considered in my medical decision making (see chart for details).     declines pain medicine.  140 p.m. requesting medicine for nausea.  Continues to decline pain medicine.  IV Reglan ordered.  3:50 PM nausea much improved.  She is requesting Tylenol for pain.  Patient is amenable to and prefers outpatient treatment of diverticulitis.  Plan prescriptions for Cipro Flagyl Norco Reglan. [place] Controlled Substance reporting System queried.  Follow-up with PMD if not improving by next week.  Return precautions given for vomiting, fever, intractable pain Final Clinical Impressions(s) / ED Diagnoses  Diagnosis: Sigmoid diverticulitis Final diagnoses:  None    ED Discharge Orders    None       Orlie Dakin, MD 05/02/18 1614

## 2018-05-03 LAB — URINE CULTURE
MICRO NUMBER:: 91383970
SPECIMEN QUALITY:: ADEQUATE

## 2018-05-05 ENCOUNTER — Encounter: Payer: Self-pay | Admitting: Family Medicine

## 2018-05-05 ENCOUNTER — Ambulatory Visit (INDEPENDENT_AMBULATORY_CARE_PROVIDER_SITE_OTHER): Payer: Medicare Other | Admitting: Family Medicine

## 2018-05-05 VITALS — BP 190/76 | HR 58 | Temp 98.2°F | Wt 149.0 lb

## 2018-05-05 DIAGNOSIS — I2510 Atherosclerotic heart disease of native coronary artery without angina pectoris: Secondary | ICD-10-CM

## 2018-05-05 DIAGNOSIS — R1032 Left lower quadrant pain: Secondary | ICD-10-CM | POA: Diagnosis not present

## 2018-05-05 MED ORDER — CEFDINIR 300 MG PO CAPS: 300.0000 mg | ORAL_CAPSULE | Freq: Two times a day (BID) | ORAL | 0 refills | Status: DC

## 2018-05-05 MED ORDER — MOXIFLOXACIN HCL 400 MG PO TABS: 400.0000 mg | ORAL_TABLET | Freq: Every day | ORAL | 0 refills | Status: AC

## 2018-05-05 NOTE — Progress Notes (Signed)
Traci [name] is a 72 y.o. female who presents to [facility]: Traci [name] today for abdominal pain.  Traci [name] was seen in the emergency department on November 16 for abdominal pain and had a noncontrast CT scan concerning for diverticulitis.  She was treated with Cipro Flagyl.  She notes some improvement with Cipro.  She was unable to tolerate Flagyl.  She continues to have abdominal pain 3 days later.  She continues a clear liquid diet.  No fevers chills nausea vomiting or diarrhea.  Symptoms are moderate.   ROS as above:  Exam:  BP (!) 190/76   Pulse (!) 58   Temp 98.2 F (36.8 C) (Oral)   Wt 149 lb (67.6 kg)   BMI 25.98 kg/m  Wt Readings from Last 5 Encounters:  05/05/18 149 lb (67.6 kg)  05/02/18 148 lb 12.8 oz (67.5 kg)  02/18/18 149 lb 3.2 oz (67.7 kg)  02/03/18 148 lb (67.1 kg)  02/02/18 148 lb 9.6 oz (67.4 kg)    Gen: Well NAD HEENT: EOMI,  MMM Lungs: Normal work of breathing. CTABL Heart: RRR no MRG Abd: NABS, Soft. Nondistended, mildly tender palpation left lower quadrant without rebound or guarding.  No masses palpated. Exts: Brisk capillary refill, warm and well perfused.   Lab and Radiology Results Results for orders placed or performed during the hospital encounter of 05/02/18 (from the past 72 hour(s))  CBC with Differential/Platelet     Status: Abnormal   Collection Time: 05/02/18 11:35 AM  Result Value Ref Range   WBC 10.6 (H) 4.0 - 10.5 K/uL   RBC 4.31 3.87 - 5.11 MIL/uL   Hemoglobin 12.6 12.0 - 15.0 g/dL   HCT 38.6 36.0 - 46.0 %   MCV 89.6 80.0 - 100.0 fL   MCH 29.2 26.0 - 34.0 pg   MCHC 32.6 30.0 - 36.0 g/dL   RDW 11.5 11.5 - 15.5 %   Platelets 273 150 - 400 K/uL   nRBC 0.0 0.0 - 0.2 %   Neutrophils Relative % 83 %   Neutro Abs 8.8 (H) 1.7 - 7.7 K/uL   Lymphocytes Relative 10 %   Lymphs Abs 1.1 0.7 - 4.0 K/uL   Monocytes  Relative 7 %   Monocytes Absolute 0.7 0.1 - 1.0 K/uL   Eosinophils Relative 0 %   Eosinophils Absolute 0.0 0.0 - 0.5 K/uL   Basophils Relative 0 %   Basophils Absolute 0.0 0.0 - 0.1 K/uL   Immature Granulocytes 0 %   Abs Immature Granulocytes 0.02 0.00 - 0.07 K/uL    Comment: Performed at [facility], [address], [place], [place] [address]  Comprehensive metabolic panel     Status: Abnormal   Collection Time: 05/02/18 11:35 AM  Result Value Ref Range   Sodium 134 (L) 135 - 145 mmol/L   Potassium 4.4 3.5 - 5.1 mmol/L   Chloride 102 98 - 111 mmol/L   CO2 24 22 - 32 mmol/L   Glucose, Bld 121 (H) 70 - 99 mg/dL   BUN 11 8 - 23 mg/dL   Creatinine, Ser 0.93 0.44 - 1.00 mg/dL   Calcium 8.4 (L) 8.9 - 10.3 mg/dL   Total Protein 7.0 6.5 - 8.1 g/dL   Albumin 3.7 3.5 - 5.0 g/dL   AST 23 15 - 41 U/L   ALT 16 0 - 44 U/L   Alkaline Phosphatase 58 38 - 126 U/L   Total Bilirubin  0.9 0.3 - 1.2 mg/dL   GFR calc non Af Amer >60 >60 mL/min   GFR calc Af Amer >60 >60 mL/min    Comment: (NOTE) The eGFR has been calculated using the CKD EPI equation. This calculation has not been validated in all clinical situations. eGFR's persistently <60 mL/min signify possible Chronic Kidney Disease.    Anion gap 8 5 - 15    Comment: Performed at [facility] [address] [place], [place] [address]  Lipase, blood     Status: None   Collection Time: 05/02/18 11:35 AM  Result Value Ref Range   Lipase 28 11 - 51 U/L    Comment: Performed at [facility] [address] [place], [place] [address]  I-Stat CG4 Lactic Acid, ED     Status: None   Collection Time: 05/02/18 11:41 AM  Result Value Ref Range   Lactic Acid, Venous 1.28 0.5 - 1.9 mmol/L   Ct Abdomen Pelvis Wo Contrast  Result Date: 05/02/2018 CLINICAL DATA:  Worsening abdominal pain, history of diverticulitis EXAM: CT ABDOMEN AND PELVIS WITHOUT CONTRAST TECHNIQUE: Multidetector CT imaging of the abdomen and pelvis was  performed following the standard protocol without IV contrast. COMPARISON:  01/14/2017 FINDINGS: Lower chest: No acute abnormality. Hepatobiliary: No focal liver abnormality is seen. Status post cholecystectomy. No biliary dilatation. Pancreas: Unremarkable. No pancreatic ductal dilatation or surrounding inflammatory changes. Spleen: Normal in size without focal abnormality. Adrenals/Urinary Tract: Adrenal glands are within normal limits. The kidneys are well visualized bilaterally. No renal calculi or urinary tract obstructive changes are seen. Bladder is decompressed. Stomach/Bowel: Wall thickening and pericolonic inflammatory changes are noted in the sigmoid colon consistent with diverticulitis. No abscess or perforation is identified. No proximal obstructive changes are seen. Stomach is within normal limits with the exception of a small sliding-type hiatal hernia. Vascular/Lymphatic: Aortic atherosclerosis. No enlarged abdominal or pelvic lymph nodes. Reproductive: Status post hysterectomy. No adnexal masses. Other: No abdominal wall hernia or abnormality. No abdominopelvic ascites. Musculoskeletal: Degenerative and postoperative changes in the lumbar spine are seen. Prior fusion is noted at L5-S1. IMPRESSION: Changes consistent with sigmoid diverticulitis deep within the pelvis. Electronically Signed   By: Inez Catalina M.D.   On: 05/02/2018 15:32  I personally (independently) visualized and performed the interpretation of the images attached in this note.     Assessment and Plan: 72 y.o. female with  Left lower quadrant abdominal pain likely partially treated diverticulitis.  Unable to tolerate Flagyl.  Broaden coverage for moxifloxacin and Omnicef.  If not improving next step would likely be repeat CT scan to assess for abscess.  Will discuss with her gastroenterologist as well.  Recheck sooner if needed.    No orders of the defined types were placed in this encounter.  Meds ordered this  encounter  Medications  . moxifloxacin (AVELOX) 400 MG tablet    Sig: Take 1 tablet (400 mg total) by mouth daily for 10 days.    Dispense:  10 tablet    Refill:  0  . cefdinir (OMNICEF) 300 MG capsule    Sig: Take 1 capsule (300 mg total) by mouth 2 (two) times daily.    Dispense:  14 capsule    Refill:  0     Historical information moved to improve visibility of documentation.  Past Medical History:  Diagnosis Date  . Anxiety   . Atrial fibrillation ([place])   . CAD (coronary artery disease)   . Claudication ([place]) 01/06/2008  Lower extremity dopplers - no evidence of arterial insufficiency, normal exam  . Coronary artery disease   . GERD (gastroesophageal reflux disease)   . Hyperlipidemia   . Hypertension 11/30/2010   echo- EF 55%; normal w/ mildly sclerotic aortic valve  . Hypertension 11/05/11   renal dopplers - celiac artery and SMA >50% diameter reduction, R renal artery - mildly elevated velocities 1-59% diameter reduction, L renal artery normal  . Nonspecific ST-T wave electrocardiographic changes 03/26/2011   R/Lmv - EF 74%, normal perfusion all regions, ST depression w/ Lexiscan infusion w/o assoc angina  . Osteopenia   . PAD (peripheral artery disease) ([place])   . Peripheral vascular disease ([place])   . PVD (peripheral vascular disease) ([place]) 11/05/2011   doppler - R/L brachial pressures essentially equal w/o inflow disease; L sublclavian/CCA bypass graft demonstrates patent flow, no evidence of significant stenosis  . Sigmoid diverticulitis    Past Surgical History:  Procedure Laterality Date  . ABDOMINAL AORTIC ANEURYSM REPAIR N/A 01/27/2014   Procedure: AORTO-SUPERIOR MESENTERIC ARTERY BYPASS GRAFT;  Surgeon: Angelia Mould, MD;  Location: [facility];  Service: Vascular;  Laterality: N/A;  . ABDOMINAL HYSTERECTOMY    . APPENDECTOMY    . CARDIAC CATHETERIZATION  06/03/2008   60% LAD, involving D2, borderline significant by IVUS, medical therapy. CFX, RCA OK.  Marland Kitchen  CATARACT EXTRACTION    . CHOLECYSTECTOMY    . EYE SURGERY     retinal surgery  . SPINE SURGERY  Feb. 2011  . [place]   X's  several  . SUBCLAVIAN ARTERY STENT Left 09/20/2008   LSA ISR 7x99m Cordis Genesis on Opta premount, reduction from 80% to 0%  . subclavian artery stents  01/23/2010   Left carotid to subclavian artery Bypass  . VISCERAL ANGIOGRAM  01/17/2014   Procedure: VISCERAL ANGIOGRAM;  Surgeon: CAngelia Mould MD;  [facility];  Service: Cardiovascular;;   Social History   Tobacco Use  . Smoking status: Former Smoker    Packs/day: 0.25    Years: 20.00    Pack years: 5.00    Types: Cigarettes    Last attempt to quit: 09/30/1993    Years since quitting: 24.6  . Smokeless tobacco: Never Used  Substance Use Topics  . Alcohol use: No   family history includes Diabetes in her maternal grandmother; Heart attack in her father; Heart disease in her paternal grandfather; Heart disease (age of onset: 660 in her father; Hyperlipidemia in her father; Hypertension in her father; Kidney disease in her father; Kidney failure in her mother.  Medications: Current Outpatient Medications  Medication Sig Dispense Refill  . aspirin EC 81 MG tablet Take 81 mg by mouth daily.    .Marland Kitchenatenolol (TENORMIN) 25 MG tablet Take 1 tablet (25 mg total) by mouth daily. 90 tablet 3  . atorvastatin (LIPITOR) 10 MG tablet Take 1 tablet (10 mg total) daily at 6 PM by mouth. 90 tablet 3  . candesartan (ATACAND) 16 MG tablet Take 0.5 tablets (8 mg total) daily by mouth. 45 tablet 3  . cholecalciferol (VITAMIN D) 1000 UNITS tablet Take 1,000 Units by mouth daily.    . clopidogrel (PLAVIX) 75 MG tablet Take 1 tablet (75 mg total) daily by mouth. 90 tablet 3  . clorazepate (TRANXENE) 7.5 MG tablet Take 1 tablet (7.5 mg total) by mouth daily as needed. 90 tablet 0  . cyanocobalamin 1000 MCG tablet Take 100 mcg by mouth daily.    .Marland Kitchen  EPINEPHrine (EPIPEN 2-PAK) 0.3 mg/0.3 mL IJ SOAJ  injection Inject 0.3 mLs (0.3 mg total) into the muscle as needed (for allergic reaction). 2 Device 0  . esomeprazole (NEXIUM) 40 MG capsule Take 1 capsule (40 mg total) by mouth daily. Take 1 tab daily (Patient taking differently: Take 40 mg by mouth daily. ) 90 capsule 0  . estradiol (ESTRACE) 0.5 MG tablet TAKE 1 TABLET DAILY 90 tablet 1  . furosemide (LASIX) 20 MG tablet TAKE 1 TABLET EVERY OTHER DAY 45 tablet 2  . HYDROcodone-acetaminophen (NORCO) 5-325 MG tablet Take 1 tablet by mouth every 6 (six) hours as needed for moderate pain or severe pain. 20 tablet 0  . linaclotide (LINZESS) 145 MCG CAPS capsule Take 1 capsule by mouth daily as needed.    . meclizine (ANTIVERT) 25 MG tablet TAKE ONE-HALF (1/2) TABLET THREE TIMES A DAY AS NEEDED FOR DIZZINESS OR NAUSEA 90 tablet 1  . metoCLOPramide (REGLAN) 10 MG tablet Take 1 tablet (10 mg total) by mouth every 6 (six) hours as needed for nausea (nausea/headache). 12 tablet 0  . Multiple Vitamin (MULTIVITAMIN WITH MINERALS) TABS Take 1 tablet by mouth daily.    . sucralfate (CARAFATE) 1 g tablet Take 1 tablet (1 g total) by mouth 4 (four) times daily. (Patient taking differently: Take 1 g by mouth 4 (four) times daily as needed (nausea/abd pain). ) 120 tablet 0  . zolpidem (AMBIEN) 10 MG tablet Take 1 tablet (10 mg total) by mouth at bedtime as needed for sleep. 90 tablet 1  . cefdinir (OMNICEF) 300 MG capsule Take 1 capsule (300 mg total) by mouth 2 (two) times daily. 14 capsule 0  . moxifloxacin (AVELOX) 400 MG tablet Take 1 tablet (400 mg total) by mouth daily for 10 days. 10 tablet 0   No current facility-administered medications for this visit.    Allergies  Allergen Reactions  . Cortisone Anaphylaxis  . Dilaudid [Hydromorphone Hcl] Nausea And Vomiting    Pt states she will start vomiting immediately for 6 hours  . Iodine Anaphylaxis  . Medrol [Methylprednisolone] Anaphylaxis  . Omnipaque [Iohexol] Anaphylaxis  . Prednisone Anaphylaxis  .  Shellfish Allergy Anaphylaxis  . Sulfa Drugs Cross Reactors Anaphylaxis  . Augmentin [Amoxicillin-Pot Clavulanate] Nausea Only    Upset stomach (Has taken cephalexin in the past without problems)  . Codeine Rash  . Erythromycin Rash  . Morphine And Related Nausea Only    nausea     Discussed warning signs or symptoms. Please see discharge instructions. Patient expresses understanding.

## 2018-05-05 NOTE — Patient Instructions (Addendum)
Thank you for coming in today. STOP Cipro.  Start moxifloxicin and omnicef.  Let me know if not improving.   Continue clear diet. If feeling better ok to slowly advance diet.  Keep me updated.    Diverticulitis Diverticulitis is infection or inflammation of small pouches (diverticula) in the colon that form due to a condition called diverticulosis. Diverticula can trap stool (feces) and bacteria, causing infection and inflammation. Diverticulitis may cause severe stomach pain and diarrhea. It may lead to tissue damage in the colon that causes bleeding. The diverticula may also burst (rupture) and cause infected stool to enter other areas of the abdomen. Complications of diverticulitis can include:  Bleeding.  Severe infection.  Severe pain.  Rupture (perforation) of the colon.  Blockage (obstruction) of the colon.  What are the causes? This condition is caused by stool becoming trapped in the diverticula, which allows bacteria to grow in the diverticula. This leads to inflammation and infection. What increases the risk? You are more likely to develop this condition if:  You have diverticulosis. The risk for diverticulosis increases if: ? You are overweight or obese. ? You use tobacco products. ? You do not get enough exercise.  You eat a diet that does not include enough fiber. High-fiber foods include fruits, vegetables, beans, nuts, and whole grains.  What are the signs or symptoms? Symptoms of this condition may include:  Pain and tenderness in the abdomen. The pain is normally located on the left side of the abdomen, but it may occur in other areas.  Fever and chills.  Bloating.  Cramping.  Nausea.  Vomiting.  Changes in bowel routines.  Blood in your stool.  How is this diagnosed? This condition is diagnosed based on:  Your medical history.  A physical exam.  Tests to make sure there is nothing else causing your condition. These tests may  include: ? Blood tests. ? Urine tests. ? Imaging tests of the abdomen, including X-rays, ultrasounds, MRIs, or CT scans.  How is this treated? Most cases of this condition are mild and can be treated at home. Treatment may include:  Taking over-the-counter pain medicines.  Following a clear liquid diet.  Taking antibiotic medicines by mouth.  Rest.  More severe cases may need to be treated at a hospital. Treatment may include:  Not eating or drinking.  Taking prescription pain medicine.  Receiving antibiotic medicines through an IV tube.  Receiving fluids and nutrition through an IV tube.  Surgery.  When your condition is under control, your health care provider may recommend that you have a colonoscopy. This is an exam to look at the entire large intestine. During the exam, a lubricated, bendable tube is inserted into the anus and then passed into the rectum, colon, and other parts of the large intestine. A colonoscopy can show how severe your diverticula are and whether something else may be causing your symptoms. Follow these instructions at home: Medicines  Take over-the-counter and prescription medicines only as told by your health care provider. These include fiber supplements, probiotics, and stool softeners.  If you were prescribed an antibiotic medicine, take it as told by your health care provider. Do not stop taking the antibiotic even if you start to feel better.  Do not drive or use heavy machinery while taking prescription pain medicine. General instructions  Follow a full liquid diet or another diet as directed by your health care provider. After your symptoms improve, your health care provider may tell you to  change your diet. He or she may recommend that you eat a diet that contains at least 25 g (25 grams) of fiber daily. Fiber makes it easier to pass stool. Healthy sources of fiber include: ? Berries. One cup contains 4-8 grams of fiber. ? Beans or lentils.  One half cup contains 5-8 grams of fiber. ? Green vegetables. One cup contains 4 grams of fiber.  Exercise for at least 30 minutes, 3 times each week. You should exercise hard enough to raise your heart rate and break a sweat.  Keep all follow-up visits as told by your health care provider. This is important. You may need a colonoscopy. Contact a health care provider if:  Your pain does not improve.  You have a hard time drinking or eating food.  Your bowel movements do not return to normal. Get help right away if:  Your pain gets worse.  Your symptoms do not get better with treatment.  Your symptoms suddenly get worse.  You have a fever.  You vomit more than one time.  You have stools that are bloody, black, or tarry. Summary  Diverticulitis is infection or inflammation of small pouches (diverticula) in the colon that form due to a condition called diverticulosis. Diverticula can trap stool (feces) and bacteria, causing infection and inflammation.  You are at higher risk for this condition if you have diverticulosis and you eat a diet that does not include enough fiber.  Most cases of this condition are mild and can be treated at home. More severe cases may need to be treated at a hospital.  When your condition is under control, your health care provider may recommend that you have an exam called a colonoscopy. This exam can show how severe your diverticula are and whether something else may be causing your symptoms. This information is not intended to replace advice given to you by your health care provider. Make sure you discuss any questions you have with your health care provider. Document Released: 03/13/2005 Document Revised: 07/06/2016 Document Reviewed: 07/06/2016 Elsevier Interactive Patient Education  [employer].

## 2018-05-06 ENCOUNTER — Ambulatory Visit: Payer: Medicare Other | Admitting: Family Medicine

## 2018-05-08 ENCOUNTER — Other Ambulatory Visit: Payer: Self-pay | Admitting: Cardiovascular Disease

## 2018-05-08 ENCOUNTER — Encounter: Payer: Self-pay | Admitting: Family Medicine

## 2018-05-08 ENCOUNTER — Telehealth: Payer: Self-pay | Admitting: Family Medicine

## 2018-05-08 NOTE — Telephone Encounter (Signed)
° ° ° °*  STAT* If patient is at the pharmacy, call can be transferred to refill team.   1. Which medications need to be refilled? (please list name of each medication and dose if known)  candesartan (ATACAND) 16 MG tablet  2. Which pharmacy/location (including street and city if local pharmacy) is medication to be sent to? [facility], [place] AT [place]  3. Do they need a 30 day or 90 day supply? [place]

## 2018-05-08 NOTE — Telephone Encounter (Signed)
Traci [name] brought me a note that states this patient has left a VM on that phone and she was checking the status of the Prolia. I advised the patient that I had not received a phone note but I would work on this today. She voices understanding.

## 2018-05-11 ENCOUNTER — Other Ambulatory Visit: Payer: Self-pay

## 2018-05-11 MED ORDER — ATENOLOL 25 MG PO TABS: 25.0000 mg | ORAL_TABLET | Freq: Every day | ORAL | 3 refills | Status: DC

## 2018-05-11 MED ORDER — CANDESARTAN CILEXETIL 16 MG PO TABS: 8.0000 mg | ORAL_TABLET | Freq: Every day | ORAL | 3 refills | Status: DC

## 2018-05-13 NOTE — Telephone Encounter (Signed)
Patient does not want to proceed with estimate for Prolia at this time. She will call [facility] and see if they will cover it when she calls.

## 2018-05-27 ENCOUNTER — Encounter: Payer: Self-pay | Admitting: Family Medicine

## 2018-05-27 ENCOUNTER — Ambulatory Visit (INDEPENDENT_AMBULATORY_CARE_PROVIDER_SITE_OTHER): Payer: Medicare Other | Admitting: Family Medicine

## 2018-05-27 VITALS — BP 149/66 | HR 61 | Temp 98.1°F | Ht 63.0 in | Wt 148.0 lb

## 2018-05-27 DIAGNOSIS — I2510 Atherosclerotic heart disease of native coronary artery without angina pectoris: Secondary | ICD-10-CM | POA: Diagnosis not present

## 2018-05-27 DIAGNOSIS — J0101 Acute recurrent maxillary sinusitis: Secondary | ICD-10-CM

## 2018-05-27 MED ORDER — CEFDINIR 300 MG PO CAPS: 300.0000 mg | ORAL_CAPSULE | Freq: Two times a day (BID) | ORAL | 0 refills | Status: DC

## 2018-05-27 MED ORDER — IPRATROPIUM BROMIDE 0.06 % NA SOLN: 2.0000 | NASAL | 6 refills | Status: DC | PRN

## 2018-05-27 NOTE — Progress Notes (Signed)
Traci [name] is a 72 y.o. female who presents to [facility] today for nasal drainage and congestion associated with hoarse voice and a sore throat.  Symptoms present for about 4 days now.  Her friend became sick around the same time after attending a concert together.  She is tried some over-the-counter medications which have helped a bit.  She is had similar episodes previously that took a long time to get better required antibiotics.  She has multiple drug allergies and other medical comorbidities.  She notes some swelling at her left trapezius.  She notes that she typically get swelling in this area when she gets sick.  She has a history of bypass artery grafts and notes that this is a common symptom for her.   ROS as above:  Exam:  BP (!) 149/66   Pulse 61   Temp 98.1 F (36.7 C) (Oral)   Ht 5\' 3"  (1.6 m)   Wt 148 lb (67.1 kg)   BMI 26.22 kg/m  Wt Readings from Last 5 Encounters:  05/27/18 148 lb (67.1 kg)  05/05/18 149 lb (67.6 kg)  05/02/18 148 lb 12.8 oz (67.5 kg)  02/18/18 149 lb 3.2 oz (67.7 kg)  02/03/18 148 lb (67.1 kg)    Gen: Well NAD HEENT: EOMI,  MMM posterior pharynx with mild cobblestoning.  Inflamed nasal turbinates bilaterally with clear discharge.  Normal tympanic membranes.  No significant cervical lymphadenopathy.  Fullness left trapezius area.  Nontender.  Mildly tender to palpation bilateral maxillary sinuses. Lungs: Normal work of breathing. CTABL Heart: RRR no MRG Abd: NABS, Soft. Nondistended, Nontender Exts: Brisk capillary refill, warm and well perfused.   Lab and Radiology Results No results found for this or any previous visit (from the past 72 hour(s)). No results found.    Assessment and Plan: 72 y.o. female with viral URI.  Plan for symptomatic management with over-the-counter medications.  Will use Atrovent  nasal spray for reduction of postnasal drainage.  I have printed a backup prescription of Omnicef.  Patient will fill this medication if she worsens.  She knows not to fill it now she is tolerated this medication well in the past.  I spent 25 minutes with this patient, greater than 50% was face-to-face time counseling regarding differential diagnosis treatment plan and backup plan..   No orders of the defined types were placed in this encounter.  Meds ordered this encounter  Medications  . ipratropium (ATROVENT) 0.06 % nasal spray    Sig: Place 2 sprays into both nostrils every 4 (four) hours as needed.    Dispense:  10 mL    Refill:  6  . cefdinir (OMNICEF) 300 MG capsule    Sig: Take 1 capsule (300 mg total) by mouth 2 (two) times daily.    Dispense:  14 capsule    Refill:  0     Historical information moved to improve visibility of documentation.  Past Medical History:  Diagnosis Date  . Anxiety   . Atrial fibrillation ([place])   . CAD (coronary artery disease)   . Claudication ([place]) 01/06/2008   Lower extremity dopplers - no evidence of arterial insufficiency, normal exam  . Coronary artery disease   . GERD (gastroesophageal reflux disease)   . Hyperlipidemia   . Hypertension 11/30/2010   echo- EF 55%; normal w/ mildly sclerotic aortic valve  . Hypertension 11/05/11   renal dopplers - celiac artery and SMA >  50% diameter reduction, R renal artery - mildly elevated velocities 1-59% diameter reduction, L renal artery normal  . Nonspecific ST-T wave electrocardiographic changes 03/26/2011   R/Lmv - EF 74%, normal perfusion all regions, ST depression w/ Lexiscan infusion w/o assoc angina  . Osteopenia   . PAD (peripheral artery disease) ([place])   . Peripheral vascular disease ([place])   . PVD (peripheral vascular disease) ([place]) 11/05/2011   doppler - R/L brachial pressures essentially equal w/o inflow disease; L sublclavian/CCA bypass graft demonstrates patent flow, no evidence of  significant stenosis  . Sigmoid diverticulitis    Past Surgical History:  Procedure Laterality Date  . ABDOMINAL AORTIC ANEURYSM REPAIR N/A 01/27/2014   Procedure: AORTO-SUPERIOR MESENTERIC ARTERY BYPASS GRAFT;  Surgeon: Angelia Mould, MD;  Location: [facility];  Service: Vascular;  Laterality: N/A;  . ABDOMINAL HYSTERECTOMY    . APPENDECTOMY    . CARDIAC CATHETERIZATION  06/03/2008   60% LAD, involving D2, borderline significant by IVUS, medical therapy. CFX, RCA OK.  Marland Kitchen CATARACT EXTRACTION    . CHOLECYSTECTOMY    . EYE SURGERY     retinal surgery  . SPINE SURGERY  Feb. 2011  . [place]   X's  several  . SUBCLAVIAN ARTERY STENT Left 09/20/2008   LSA ISR 7x62mm Cordis Genesis on Opta premount, reduction from 80% to 0%  . subclavian artery stents  01/23/2010   Left carotid to subclavian artery Bypass  . VISCERAL ANGIOGRAM  01/17/2014   Procedure: VISCERAL ANGIOGRAM;  Surgeon: Angelia Mould, MD;  Location: [facility] CATH LAB;  Service: Cardiovascular;;   Social History   Tobacco Use  . Smoking status: Former Smoker    Packs/day: 0.25    Years: 20.00    Pack years: 5.00    Types: Cigarettes    Last attempt to quit: 09/30/1993    Years since quitting: 24.6  . Smokeless tobacco: Never Used  Substance Use Topics  . Alcohol use: No   family history includes Diabetes in her maternal grandmother; Heart attack in her father; Heart disease in her paternal grandfather; Heart disease (age of onset: 70) in her father; Hyperlipidemia in her father; Hypertension in her father; Kidney disease in her father; Kidney failure in her mother.  Medications: Current Outpatient Medications  Medication Sig Dispense Refill  . aspirin EC 81 MG tablet Take 81 mg by mouth daily.    Marland Kitchen atenolol (TENORMIN) 25 MG tablet Take 1 tablet (25 mg total) by mouth daily. 90 tablet 3  . atorvastatin (LIPITOR) 10 MG tablet TAKE 1 TABLET DAILY AT 6 P.M. 90 tablet 4  . candesartan (ATACAND) 16 MG  tablet Take 0.5 tablets (8 mg total) by mouth daily. 45 tablet 3  . cefdinir (OMNICEF) 300 MG capsule Take 1 capsule (300 mg total) by mouth 2 (two) times daily. 14 capsule 0  . cholecalciferol (VITAMIN D) 1000 UNITS tablet Take 1,000 Units by mouth daily.    . clopidogrel (PLAVIX) 75 MG tablet TAKE 1 TABLET DAILY 90 tablet 4  . clorazepate (TRANXENE) 7.5 MG tablet Take 1 tablet (7.5 mg total) by mouth daily as needed. 90 tablet 0  . cyanocobalamin 1000 MCG tablet Take 100 mcg by mouth daily.    Marland Kitchen EPINEPHrine (EPIPEN 2-PAK) 0.3 mg/0.3 mL IJ SOAJ injection Inject 0.3 mLs (0.3 mg total) into the muscle as needed (for allergic reaction). 2 Device 0  . esomeprazole (NEXIUM) 40 MG capsule Take 1 capsule (40 mg total) by mouth daily.  Take 1 tab daily (Patient taking differently: Take 40 mg by mouth daily. ) 90 capsule 0  . estradiol (ESTRACE) 0.5 MG tablet TAKE 1 TABLET DAILY 90 tablet 1  . furosemide (LASIX) 20 MG tablet TAKE 1 TABLET EVERY OTHER DAY 45 tablet 2  . ipratropium (ATROVENT) 0.06 % nasal spray Place 2 sprays into both nostrils every 4 (four) hours as needed. 10 mL 6  . linaclotide (LINZESS) 145 MCG CAPS capsule Take 1 capsule by mouth daily as needed.    . meclizine (ANTIVERT) 25 MG tablet TAKE ONE-HALF (1/2) TABLET THREE TIMES A DAY AS NEEDED FOR DIZZINESS OR NAUSEA 90 tablet 1  . metoCLOPramide (REGLAN) 10 MG tablet Take 1 tablet (10 mg total) by mouth every 6 (six) hours as needed for nausea (nausea/headache). 12 tablet 0  . Multiple Vitamin (MULTIVITAMIN WITH MINERALS) TABS Take 1 tablet by mouth daily.    . sucralfate (CARAFATE) 1 g tablet Take 1 tablet (1 g total) by mouth 4 (four) times daily. (Patient taking differently: Take 1 g by mouth 4 (four) times daily as needed (nausea/abd pain). ) 120 tablet 0  . zolpidem (AMBIEN) 10 MG tablet Take 1 tablet (10 mg total) by mouth at bedtime as needed for sleep. 90 tablet 1   No current facility-administered medications for this visit.     Allergies  Allergen Reactions  . Cortisone Anaphylaxis  . Dilaudid [Hydromorphone Hcl] Nausea And Vomiting    Pt states she will start vomiting immediately for 6 hours  . Iodine Anaphylaxis  . Medrol [Methylprednisolone] Anaphylaxis  . Omnipaque [Iohexol] Anaphylaxis  . Prednisone Anaphylaxis  . Shellfish Allergy Anaphylaxis  . Sulfa Drugs Cross Reactors Anaphylaxis  . Augmentin [Amoxicillin-Pot Clavulanate] Nausea Only    Upset stomach (Has taken cephalexin in the past without problems)  . Codeine Rash  . Erythromycin Rash  . Morphine And Related Nausea Only    nausea     Discussed warning signs or symptoms. Please see discharge instructions. Patient expresses understanding.

## 2018-05-27 NOTE — Patient Instructions (Signed)
Thank you for coming in today. Continue over the counter treatment.  Add Atrovent nasal spray as this will help reduce nasal secretions and post nasal draining.  If worsening ok to fill and take cefdinir antibiotics printed today.    Sinusitis, Adult Sinusitis is soreness and inflammation of your sinuses. Sinuses are hollow spaces in the bones around your face. They are located:  Around your eyes.  In the middle of your forehead.  Behind your nose.  In your cheekbones.  Your sinuses and nasal passages are lined with a stringy fluid (mucus). Mucus normally drains out of your sinuses. When your nasal tissues get inflamed or swollen, the mucus can get trapped or blocked so air cannot flow through your sinuses. This lets bacteria, viruses, and funguses grow, and that leads to infection. Follow these instructions at home: Medicines  Take, use, or apply over-the-counter and prescription medicines only as told by your doctor. These may include nasal sprays.  If you were prescribed an antibiotic medicine, take it as told by your doctor. Do not stop taking the antibiotic even if you start to feel better. Hydrate and Humidify  Drink enough water to keep your pee (urine) clear or pale yellow.  Use a cool mist humidifier to keep the humidity level in your home above 50%.  Breathe in steam for 10-15 minutes, 3-4 times a day or as told by your doctor. You can do this in the bathroom while a hot shower is running.  Try not to spend time in cool or dry air. Rest  Rest as much as possible.  Sleep with your head raised (elevated).  Make sure to get enough sleep each night. General instructions  Put a warm, moist washcloth on your face 3-4 times a day or as told by your doctor. This will help with discomfort.  Wash your hands often with soap and water. If there is no soap and water, use hand sanitizer.  Do not smoke. Avoid being around people who are smoking (secondhand smoke).  Keep all  follow-up visits as told by your doctor. This is important. Contact a doctor if:  You have a fever.  Your symptoms get worse.  Your symptoms do not get better within 10 days. Get help right away if:  You have a very bad headache.  You cannot stop throwing up (vomiting).  You have pain or swelling around your face or eyes.  You have trouble seeing.  You feel confused.  Your neck is stiff.  You have trouble breathing. This information is not intended to replace advice given to you by your health care provider. Make sure you discuss any questions you have with your health care provider. Document Released: 11/20/2007 Document Revised: 01/28/2016 Document Reviewed: 03/29/2015 Elsevier Interactive Patient Education  [employer].

## 2018-06-03 NOTE — Progress Notes (Signed)
Subjective:   Traci [name] is a 72 y.o. female who presents for Medicare Annual (Subsequent) preventive examination.  Review of Systems:  No ROS.  Medicare Wellness Visit. Additional risk factors are reflected in the social history.  Cardiac Risk Factors include: dyslipidemia;advanced age (>109men, >35 women) Sleep patterns:Getting 6-8 hours of sleep at night.Wakes up occasionally during the night to go to the bathroom. Wakes up feeling rested Home Safety/Smoke Alarms: Feels safe in home. Smoke alarms in place.  Living environment; Lives with son in 1 story home. No stairs in the house. Shower is a step up over tub and grab bars are in place. Seat Belt Safety/Bike Helmet: Wears seat belt.   Female:   Pap-  Aged out     Mammo- utd      Dexa scan- utd       CCS- utd      Objective:     Vitals: BP (!) 119/51 (BP Location: Right Arm, Patient Position: Sitting, Cuff Size: Normal)   Pulse 68   Ht 5\' 4"  (1.626 m)   Wt 153 lb (69.4 kg)   SpO2 99%   BMI 26.26 kg/m   Body mass index is 26.26 kg/m.  Advanced Directives 06/15/2018 07/20/2017 01/14/2017 10/08/2016 09/04/2016 09/01/2016 08/22/2016  Does Patient Have a Medical Advance Directive? Yes No Yes Yes Yes Yes Yes  Type of Paramedic of [place];Living will - [place];Living will [place];Living will [place];Living will Living will;Healthcare Power of [place];Living will  Does patient want to make changes to medical advance directive? No - Patient declined - - - - No - Patient declined No - Patient declined  Copy of [place] in Chart? No - copy requested - - - - - No - copy requested  Would patient like information on creating a medical advance directive? - No - Patient declined - - - - -  Pre-existing out of facility DNR order (yellow form or pink MOST form) - - - - - - -    Tobacco Social  History   Tobacco Use  Smoking Status Former Smoker  . Packs/day: 0.25  . Years: 20.00  . Pack years: 5.00  . Types: Cigarettes  . Last attempt to quit: 09/30/1993  . Years since quitting: 24.7  Smokeless Tobacco Never Used     Counseling given: Not Answered   Clinical Intake:  Pre-visit preparation completed: Yes  Pain : No/denies pain     Nutritional Risks: None Diabetes: No  How often do you need to have someone help you when you read instructions, pamphlets, or other written materials from your doctor or pharmacy?: 1 - Never What is the last grade level you completed in school?: 16  Interpreter Needed?: No  Information entered by :: Orlie Dakin, LPN  Past Medical History:  Diagnosis Date  . Anxiety   . Atrial fibrillation ([place])   . CAD (coronary artery disease)   . Claudication ([place]) 01/06/2008   Lower extremity dopplers - no evidence of arterial insufficiency, normal exam  . Coronary artery disease   . GERD (gastroesophageal reflux disease)   . Hyperlipidemia   . Hypertension 11/30/2010   echo- EF 55%; normal w/ mildly sclerotic aortic valve  . Hypertension 11/05/11   renal dopplers - celiac artery and SMA >50% diameter reduction, R renal artery - mildly elevated velocities 1-59% diameter reduction, L renal artery normal  . Nonspecific ST-T  wave electrocardiographic changes 03/26/2011   R/Lmv - EF 74%, normal perfusion all regions, ST depression w/ Lexiscan infusion w/o assoc angina  . Osteopenia   . PAD (peripheral artery disease) ([place])   . Peripheral vascular disease ([place])   . PVD (peripheral vascular disease) ([place]) 11/05/2011   doppler - R/L brachial pressures essentially equal w/o inflow disease; L sublclavian/CCA bypass graft demonstrates patent flow, no evidence of significant stenosis  . Sigmoid diverticulitis    Past Surgical History:  Procedure Laterality Date  . ABDOMINAL AORTIC ANEURYSM REPAIR N/A 01/27/2014   Procedure: AORTO-SUPERIOR MESENTERIC  ARTERY BYPASS GRAFT;  Surgeon: Angelia Mould, MD;  Location: [facility];  Service: Vascular;  Laterality: N/A;  . ABDOMINAL HYSTERECTOMY    . APPENDECTOMY    . CARDIAC CATHETERIZATION  06/03/2008   60% LAD, involving D2, borderline significant by IVUS, medical therapy. CFX, RCA OK.  Marland Kitchen CATARACT EXTRACTION    . CHOLECYSTECTOMY    . EYE SURGERY     retinal surgery  . SPINE SURGERY  Feb. 2011  . [place]   X's  several  . SUBCLAVIAN ARTERY STENT Left 09/20/2008   LSA ISR 7x9mm Cordis Genesis on Opta premount, reduction from 80% to 0%  . subclavian artery stents  01/23/2010   Left carotid to subclavian artery Bypass  . VISCERAL ANGIOGRAM  01/17/2014   Procedure: VISCERAL ANGIOGRAM;  Surgeon: Angelia Mould, MD;  Location: [facility] CATH LAB;  Service: Cardiovascular;;   Family History  Problem Relation Age of Onset  . Heart disease Father 87       Heart Disease before age 58  . Kidney disease Father   . Heart attack Father   . Hyperlipidemia Father   . Hypertension Father   . Kidney failure Mother   . Diabetes Maternal Grandmother   . Heart disease Paternal Grandfather    Social History   Socioeconomic History  . Marital status: Legally Separated    Spouse name: Not on file  . Number of children: 1  . Years of education: 72  . Highest education level: Bachelor's degree (e.g., BA, AB, BS)  Occupational History  . Occupation: retired    Comment: Marine scientist  Social Needs  . Financial resource strain: Not hard at all  . Food insecurity:    Worry: Never true    Inability: Never true  . Transportation needs:    Medical: No    Non-medical: No  Tobacco Use  . Smoking status: Former Smoker    Packs/day: 0.25    Years: 20.00    Pack years: 5.00    Types: Cigarettes    Last attempt to quit: 09/30/1993    Years since quitting: 24.7  . Smokeless tobacco: Never Used  Substance and Sexual Activity  . Alcohol use: No  . Drug use: No  . Sexual activity: Never   Lifestyle  . Physical activity:    Days per week: 3 days    Minutes per session: 30 min  . Stress: Not at all  Relationships  . Social connections:    Talks on phone: More than three times a week    Gets together: Twice a week    Attends religious service: More than 4 times per year    Active member of club or organization: Yes    Attends meetings of clubs or organizations: More than 4 times per year    Relationship status: Widowed  Other Topics Concern  . 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 No caffeine use.    Outpatient Encounter Medications as of 06/15/2018  Medication Sig  . aspirin EC 81 MG tablet Take 81 mg by mouth daily.  Marland Kitchen atenolol (TENORMIN) 25 MG tablet Take 1 tablet (25 mg total) by mouth daily.  Marland Kitchen atorvastatin (LIPITOR) 10 MG tablet TAKE 1 TABLET DAILY AT 6 P.M.  . candesartan (ATACAND) 16 MG tablet Take 0.5 tablets (8 mg total) by mouth daily.  . cholecalciferol (VITAMIN D) 1000 UNITS tablet Take 1,000 Units by mouth daily.  . clopidogrel (PLAVIX) 75 MG tablet TAKE 1 TABLET DAILY  . clorazepate (TRANXENE) 7.5 MG tablet Take 1 tablet (7.5 mg total) by mouth daily as needed.  . cyanocobalamin 1000 MCG tablet Take 100 mcg by mouth daily.  Marland Kitchen EPINEPHrine (EPIPEN 2-PAK) 0.3 mg/0.3 mL IJ SOAJ injection Inject 0.3 mLs (0.3 mg total) into the muscle as needed (for allergic reaction).  Marland Kitchen esomeprazole (NEXIUM) 40 MG capsule Take 1 capsule (40 mg total) by mouth daily. Take 1 tab daily (Patient taking differently: Take 40 mg by mouth daily. )  . estradiol (ESTRACE) 0.5 MG tablet TAKE 1 TABLET DAILY  . furosemide (LASIX) 20 MG tablet TAKE 1 TABLET EVERY OTHER DAY (Patient taking differently: Take 20 mg by mouth 3 (three) times a week. )  . ipratropium (ATROVENT) 0.06 % nasal spray Place 2 sprays into both nostrils every 4 (four) hours as needed.  . meclizine (ANTIVERT) 25 MG tablet TAKE ONE-HALF (1/2) TABLET THREE TIMES A  DAY AS NEEDED FOR DIZZINESS OR NAUSEA  . Multiple Vitamin (MULTIVITAMIN WITH MINERALS) TABS Take 1 tablet by mouth daily.  Marland Kitchen zolpidem (AMBIEN) 10 MG tablet Take 1 tablet (10 mg total) by mouth at bedtime as needed for sleep.  Marland Kitchen linaclotide (LINZESS) 145 MCG CAPS capsule Take 1 capsule by mouth daily as needed.  . metoCLOPramide (REGLAN) 10 MG tablet Take 1 tablet (10 mg total) by mouth every 6 (six) hours as needed for nausea (nausea/headache). (Patient not taking: Reported on 06/15/2018)  . sucralfate (CARAFATE) 1 g tablet Take 1 tablet (1 g total) by mouth 4 (four) times daily. (Patient not taking: Reported on 06/15/2018)  . [DISCONTINUED] cefdinir (OMNICEF) 300 MG capsule Take 1 capsule (300 mg total) by mouth 2 (two) times daily.  . [DISCONTINUED] furosemide (LASIX) 20 MG tablet TAKE 1 TABLET EVERY OTHER DAY   No facility-administered encounter medications on file as of 06/15/2018.     Activities of Daily Living In your present state of health, do you have any difficulty performing the following activities: 06/15/2018  Hearing? N  Vision? N  Difficulty concentrating or making decisions? N  Walking or climbing stairs? N  Dressing or bathing? N  Doing errands, shopping? N  Preparing Food and eating ? N  Using the Toilet? N  In the past six months, have you accidently leaked urine? Y  Comment occasionally  Do you have problems with loss of bowel control? N  Managing your Medications? N  Managing your Finances? N  Housekeeping or managing your Housekeeping? N  Some recent data might be hidden    Patient Care Team: Gregor Hams, MD as PCP - General (Family Medicine) Erline Levine, MD as Consulting Physician (Neurosurgery) Croitoru, Dani Gobble, MD as Consulting Physician (Cardiology) Janie Morning, MD (Gastroenterology)    Assessment:   This is a routine wellness examination for Sumeya.Physical assessment deferred to PCP.   Exercise  Activities and Dietary  recommendations Current Exercise Habits: Home exercise routine, Type of exercise: walking, Time (Minutes): 30, Frequency (Times/Week): 3, Weekly Exercise (Minutes/Week): 90, Intensity: Mild, Exercise limited by: cardiac condition(s) Diet- Does a lot of high protein low fiber diet. Watches the bread intake. Breakfast: [place] bacon and egg beaters. Lunch:  Meat, vegetable (2) Dinner:  Half a sandwich or cereal and a banana     Goals    . Weight (lb) < 200 lb (90.7 kg) (pt-stated)     She would like to lose 5 pounds.       Fall Risk Fall Risk  06/15/2018 10/30/2017 08/22/2016 05/24/2016 09/23/2014  Falls in the past year? 1 No No No No  Comment - - - Emmi Telephone Survey: data to providers prior to load -  Follow up Falls prevention discussed - - - -   Is the patient's home free of loose throw rugs in walkways, pet beds, electrical cords, etc?   yes      Grab bars in the bathroom? yes      Handrails on the stairs?   no      Adequate lighting?   yes   Depression Screen PHQ 2/9 Scores 06/15/2018 10/30/2017 08/22/2016 09/23/2014  PHQ - 2 Score 0 0 0 0     Cognitive Function     6CIT Screen 06/15/2018 08/22/2016  What Year? 0 points 0 points  What month? 0 points 0 points  What time? 0 points 0 points  Count back from 20 0 points 0 points  Months in reverse 0 points 0 points  Repeat phrase 2 points 0 points  Total Score 2 0    Immunization History  Administered Date(s) Administered  . Influenza-Unspecified 03/02/2015, 03/15/2016, 02/21/2017  . Lyme Disease 03/20/2009  . Pneumococcal Conjugate-13 08/30/2014  . Pneumococcal Polysaccharide-23 10/10/2011, 06/17/2012  . Tdap 06/17/2010, 04/08/2011  . Zoster 03/22/2012, 06/17/2012    Screening Tests Health Maintenance  Topic Date Due  . INFLUENZA VACCINE  09/23/2018 (Originally 01/15/2018)  . MAMMOGRAM  12/05/2019  . TETANUS/TDAP  04/07/2021  . COLONOSCOPY  07/16/2025  . DEXA SCAN  Completed  . Hepatitis C Screening  Completed  .  PNA vac Low Risk Adult  Completed        Plan:      Ms. Gray Bernhardt , Thank you for taking time to come for your Medicare Wellness Visit. I appreciate your ongoing commitment to your health goals. Please review the following plan we discussed and let me know if I can assist you in the future. Please schedule your next medicare wellness visit with me in 1 yr.  Continue doing brain stimulating activities (puzzles, reading, adult coloring books, staying active) to keep memory sharp.    These are the goals we discussed: Goals    . Weight (lb) < 200 lb (90.7 kg) (pt-stated)     She would like to lose 5 pounds.       This is a list of the screening recommended for you and due dates:  Health Maintenance  Topic Date Due  . Flu Shot  09/23/2018*  . Mammogram  12/05/2019  . Tetanus Vaccine  04/07/2021  . Colon Cancer Screening  07/16/2025  . DEXA scan (bone density measurement)  Completed  .  Hepatitis C: One time screening is recommended by Center for Disease Control  (CDC) for  adults born from [birth year] through 1964.   Completed  . Pneumonia vaccines  Completed  *Topic was postponed. The date  shown is not the original due date.      I have personally reviewed and noted the following in the patient's chart:   . Medical and social history . Use of alcohol, tobacco or illicit drugs  . Current medications and supplements . Functional ability and status . Nutritional status . Physical activity . Advanced directives . List of other physicians . Hospitalizations, surgeries, and ER visits in previous 12 months . Vitals . Screenings to include cognitive, depression, and falls . Referrals and appointments  In addition, I have reviewed and discussed with patient certain preventive protocols, quality metrics, and best practice recommendations. A written personalized care plan for preventive services as well as general preventive health recommendations were provided to patient.      Joanne Chars, LPN  [id number]

## 2018-06-08 ENCOUNTER — Ambulatory Visit: Payer: Medicare Other

## 2018-06-10 ENCOUNTER — Other Ambulatory Visit: Payer: Self-pay | Admitting: Cardiovascular Disease

## 2018-06-15 ENCOUNTER — Ambulatory Visit (INDEPENDENT_AMBULATORY_CARE_PROVIDER_SITE_OTHER): Payer: Medicare Other | Admitting: *Deleted

## 2018-06-15 VITALS — BP 119/51 | HR 68 | Ht 64.0 in | Wt 153.0 lb

## 2018-06-15 DIAGNOSIS — Z0000 Encounter for general adult medical examination without abnormal findings: Secondary | ICD-10-CM

## 2018-06-15 NOTE — Patient Instructions (Signed)
[name] , Thank you for taking time to come for your Medicare Wellness Visit. I appreciate your ongoing commitment to your health goals. Please review the following plan we discussed and let me know if I can assist you in the future. Please schedule your next medicare wellness visit with me in 1 yr. Continue doing brain stimulating activities (puzzles, reading, adult coloring books, staying active) to keep memory sharp.    These are the goals we discussed: Goals    . Weight (lb) < 200 lb (90.7 kg) (pt-stated)     She would like to lose 5 pounds.

## 2018-07-03 ENCOUNTER — Ambulatory Visit (INDEPENDENT_AMBULATORY_CARE_PROVIDER_SITE_OTHER): Payer: Medicare Other | Admitting: Osteopathic Medicine

## 2018-07-03 ENCOUNTER — Ambulatory Visit (INDEPENDENT_AMBULATORY_CARE_PROVIDER_SITE_OTHER): Payer: Medicare Other

## 2018-07-03 ENCOUNTER — Encounter: Payer: Self-pay | Admitting: Osteopathic Medicine

## 2018-07-03 VITALS — BP 134/87 | HR 55 | Temp 98.2°F | Wt 157.8 lb

## 2018-07-03 DIAGNOSIS — R0602 Shortness of breath: Secondary | ICD-10-CM

## 2018-07-03 DIAGNOSIS — R079 Chest pain, unspecified: Secondary | ICD-10-CM | POA: Diagnosis not present

## 2018-07-03 DIAGNOSIS — R109 Unspecified abdominal pain: Secondary | ICD-10-CM

## 2018-07-03 DIAGNOSIS — R399 Unspecified symptoms and signs involving the genitourinary system: Secondary | ICD-10-CM

## 2018-07-03 DIAGNOSIS — R002 Palpitations: Secondary | ICD-10-CM

## 2018-07-03 LAB — POCT URINALYSIS DIPSTICK
BILIRUBIN UA: NEGATIVE
GLUCOSE UA: NEGATIVE
Ketones, UA: NEGATIVE
LEUKOCYTES UA: NEGATIVE
Nitrite, UA: NEGATIVE
Protein, UA: NEGATIVE
RBC UA: NEGATIVE
Spec Grav, UA: 1.01 (ref 1.010–1.025)
Urobilinogen, UA: 0.2 E.U./dL
pH, UA: 5 (ref 5.0–8.0)

## 2018-07-03 MED ORDER — VALACYCLOVIR HCL 1 G PO TABS: 1000.0000 mg | ORAL_TABLET | Freq: Three times a day (TID) | ORAL | 0 refills | Status: AC

## 2018-07-03 NOTE — Patient Instructions (Addendum)
Plan:  I think we should go ahead and treat for shingles given the pain on the side that you are having.  An antiviral medication was sent to your pharmacy.  I think we need some more information to determine if anything else is going on with the lungs or the heart.  We will get some lab work today and a chest x-ray.  If your symptoms of shortness of breath worsen or you experience severe left-sided pain or any other concerning chest pain, please seek emergency medical care ASAP.

## 2018-07-03 NOTE — Progress Notes (Signed)
HPI: Traci [name] is a 73 y.o. female who  has a past medical history of Anxiety, Atrial fibrillation ([place]), CAD (coronary artery disease), Claudication ([place]) (01/06/2008), Coronary artery disease, GERD (gastroesophageal reflux disease), Hyperlipidemia, Hypertension (11/30/2010), Hypertension (11/05/11), Nonspecific ST-T wave electrocardiographic changes (03/26/2011), Osteopenia, PAD (peripheral artery disease) ([place]), Peripheral vascular disease ([place]), PVD (peripheral vascular disease) ([place]) (11/05/2011), and Sigmoid diverticulitis.  she presents to [facility] today, 07/03/18,  for chief complaint of: SOB, side pain  . Context: no injury. Hx CAD. Reports UTD on all vaccines including PNA and Shingles  . Location/Quality: chest congestion and shortness of breath feels worse on L side. She points to a band of sensitivity around lower ribcage and states it's very tender to the touch. Hurts to take a deep breath in.  . Duration: 3 days . Timing: constant, SOB worse with exertion . Assoc signs/symptoms: "I get palpitations all the time." nothing new for her.  . Worried about flank pain "kidneys" and would like checked for UTI     Past medical, surgical, social and family history reviewed and updated as necessary.   Current medication list and allergy/intolerance information reviewed:    Current Outpatient Medications  Medication Sig Dispense Refill  . aspirin EC 81 MG tablet Take 81 mg by mouth daily.    Marland Kitchen atenolol (TENORMIN) 25 MG tablet Take 1 tablet (25 mg total) by mouth daily. 90 tablet 3  . atorvastatin (LIPITOR) 10 MG tablet TAKE 1 TABLET DAILY AT 6 P.M. 90 tablet 4  . candesartan (ATACAND) 16 MG tablet Take 0.5 tablets (8 mg total) by mouth daily. 45 tablet 3  . cholecalciferol (VITAMIN D) 1000 UNITS tablet Take 1,000 Units by mouth daily.    . clopidogrel (PLAVIX) 75 MG tablet TAKE 1 TABLET DAILY 90 tablet 4  . clorazepate (TRANXENE) 7.5  MG tablet Take 1 tablet (7.5 mg total) by mouth daily as needed. 90 tablet 0  . cyanocobalamin 1000 MCG tablet Take 100 mcg by mouth daily.    Marland Kitchen EPINEPHrine (EPIPEN 2-PAK) 0.3 mg/0.3 mL IJ SOAJ injection Inject 0.3 mLs (0.3 mg total) into the muscle as needed (for allergic reaction). 2 Device 0  . esomeprazole (NEXIUM) 40 MG capsule Take 1 capsule (40 mg total) by mouth daily. Take 1 tab daily (Patient taking differently: Take 40 mg by mouth daily. ) 90 capsule 0  . estradiol (ESTRACE) 0.5 MG tablet TAKE 1 TABLET DAILY 90 tablet 1  . furosemide (LASIX) 20 MG tablet TAKE 1 TABLET EVERY OTHER DAY (Patient taking differently: Take 20 mg by mouth 3 (three) times a week. ) 45 tablet 6  . ipratropium (ATROVENT) 0.06 % nasal spray Place 2 sprays into both nostrils every 4 (four) hours as needed. 10 mL 6  . linaclotide (LINZESS) 145 MCG CAPS capsule Take 1 capsule by mouth daily as needed.    . meclizine (ANTIVERT) 25 MG tablet TAKE ONE-HALF (1/2) TABLET THREE TIMES A DAY AS NEEDED FOR DIZZINESS OR NAUSEA 90 tablet 1  . metoCLOPramide (REGLAN) 10 MG tablet Take 1 tablet (10 mg total) by mouth every 6 (six) hours as needed for nausea (nausea/headache). (Patient not taking: Reported on 06/15/2018) 12 tablet 0  . Multiple Vitamin (MULTIVITAMIN WITH MINERALS) TABS Take 1 tablet by mouth daily.    . sucralfate (CARAFATE) 1 g tablet Take 1 tablet (1 g total) by mouth 4 (four) times daily. (Patient not taking: Reported on 06/15/2018) 120 tablet 0  . zolpidem (  AMBIEN) 10 MG tablet Take 1 tablet (10 mg total) by mouth at bedtime as needed for sleep. 90 tablet 1   No current facility-administered medications for this visit.     Allergies  Allergen Reactions  . Cortisone Anaphylaxis  . Dilaudid [Hydromorphone Hcl] Nausea And Vomiting    Pt states she will start vomiting immediately for 6 hours  . Iodine Anaphylaxis  . Medrol [Methylprednisolone] Anaphylaxis  . Omnipaque [Iohexol] Anaphylaxis  . Prednisone  Anaphylaxis  . Shellfish Allergy Anaphylaxis  . Sulfa Drugs Cross Reactors Anaphylaxis  . Augmentin [Amoxicillin-Pot Clavulanate] Nausea Only    Upset stomach (Has taken cephalexin in the past without problems)  . Codeine Rash  . Erythromycin Rash  . Morphine And Related Nausea Only    nausea      Review of Systems:  Constitutional:  No  fever, no chills, No recent illness, No unintentional weight changes. No significant fatigue.   HEENT: No  headache, no vision change, no hearing change, No sore throat, No  sinus pressure  Cardiac: No  chest pain, No  pressure, +palpitations  Respiratory:  +shortness of breath. No  Cough  Gastrointestinal: No  abdominal pain, No  nausea, No  vomiting,  No  blood in stool, No  diarrhea  Musculoskeletal: No new myalgia/arthralgia  Skin: No  Rash  Neurologic: No  weakness, No  dizziness  Exam:  BP 134/87 (BP Location: Left Arm, Patient Position: Sitting, Cuff Size: Normal)   Pulse (!) 55   Temp 98.2 F (36.8 C) (Oral)   Wt 157 lb 12.8 oz (71.6 kg)   SpO2 98%   BMI 27.09 kg/m   Constitutional: VS see above. General Appearance: alert, well-developed, well-nourished, NAD  Eyes: Normal lids and conjunctive, non-icteric sclera  Ears, Nose, Mouth, Throat: MMM, Normal external inspection ears/nares/mouth/lips/gums.   Neck: No masses, trachea midline. No tenderness/mass appreciated. No lymphadenopathy  Respiratory: Normal respiratory effort. no wheeze, no rhonchi, no rales  Cardiovascular: S1/S2 normal, no murmur, no rub/gallop auscultated. RRR. No lower extremity edema.   Musculoskeletal: Gait normal.   Neurological: Normal balance/coordination. No tremor.   Skin: warm, dry, intact.   Psychiatric: Normal judgment/insight. Normal mood and affect.   EKG interpretation: Rate: 53 Rhythm: sinus No ST/T changes concerning for acute ischemia/infarct  Previous EKG about the same 10/2017   Results for orders placed or performed in  visit on 07/03/18 (from the past 72 hour(s))  POCT Urinalysis Dipstick     Status: None   Collection Time: 07/03/18 10:50 AM  Result Value Ref Range   Color, UA YELLOW    Clarity, UA CLEAR    Glucose, UA Negative Negative   Bilirubin, UA NEGATIVE    Ketones, UA NEGATIVE    Spec Grav, UA 1.010 1.010 - 1.025   Blood, UA NEGATIVE    pH, UA 5.0 5.0 - 8.0   Protein, UA Negative Negative   Urobilinogen, UA 0.2 0.2 or 1.0 E.U./dL   Nitrite, UA NEGATIVE    Leukocytes, UA Negative Negative   Appearance     Odor      CXR on personal review Cardiomediastinal silhouette/heart size: normal Obvious bony abnormality: none Infiltrate: none Mass or other opacity: none Atelectasis: none Diaphragms: normal Lateral view: normal Images were reviewed. Pt counseled that radiologist will review the images as well, our office will call if the formal read reveals any significant findings other than what has been noted above.         ASSESSMENT/PLAN: The primary  encounter diagnosis was SOB (shortness of breath). Diagnoses of UTI symptoms, Palpitations, and Side pain were also pertinent to this visit.   Possible shingles - band of superficial pain and skin sensitivity, no rash but may be mild given she has been vaccinated. Will trial antivirals  EKG ok but advised not predictive, if worse then go to ER  Seems more likely shingles w/ prodrome or other viral syndrome, no s/s CHF  Meds ordered this encounter  Medications  . valACYclovir (VALTREX) 1000 MG tablet    Sig: Take 1 tablet (1,000 mg total) by mouth 3 (three) times daily for 7 days.    Dispense:  21 tablet    Refill:  0    Orders Placed This Encounter  Procedures  . Urine Culture  . DG Chest 2 View  . Urinalysis, microscopic only  . TSH  . COMPLETE METABOLIC PANEL WITH GFR  . CBC with Differential/Platelet  . B Nat Peptide  . POCT Urinalysis Dipstick  . EKG 12-Lead    Patient Instructions  Plan:  I think we should go  ahead and treat for shingles given the pain on the side that you are having.  An antiviral medication was sent to your pharmacy.  I think we need some more information to determine if anything else is going on with the lungs or the heart.  We will get some lab work today and a chest x-ray.  If your symptoms of shortness of breath worsen or you experience severe left-sided pain or any other concerning chest pain, please seek emergency medical care ASAP.         Visit summary with medication list and pertinent instructions was printed for patient to review. All questions at time of visit were answered - patient instructed to contact office with any additional concerns or updates. ER/RTC precautions were reviewed with the patient.   Follow-up plan: Return if symptoms worsen or fail to improve see Dr Georgina Snell or Dr Sheppard Coil next week .    Please note: voice recognition software was used to produce this document, and typos may escape review. Please contact Dr. Sheppard Coil for any needed clarifications.

## 2018-07-04 LAB — URINALYSIS, MICROSCOPIC ONLY
Bacteria, UA: NONE SEEN /HPF
HYALINE CAST: NONE SEEN /LPF
RBC / HPF: NONE SEEN /HPF (ref 0–2)
Squamous Epithelial / LPF: NONE SEEN /HPF (ref <=5)
WBC, UA: NONE SEEN /HPF (ref 0–5)

## 2018-07-04 LAB — COMPLETE METABOLIC PANEL WITH GFR
AG Ratio: 1.4 (calc) (ref 1.0–2.5)
ALBUMIN MSPROF: 4.2 g/dL (ref 3.6–5.1)
ALT: 16 U/L (ref 6–29)
AST: 24 U/L (ref 10–35)
Alkaline phosphatase (APISO): 54 U/L (ref 33–130)
BUN: 14 mg/dL (ref 7–25)
CALCIUM: 9.3 mg/dL (ref 8.6–10.4)
CHLORIDE: 101 mmol/L (ref 98–110)
CO2: 29 mmol/L (ref 20–32)
Creat: 0.88 mg/dL (ref 0.60–0.93)
GFR, EST AFRICAN AMERICAN: 77 mL/min/{1.73_m2} (ref >=60)
GFR, Est Non African American: 66 mL/min/{1.73_m2} (ref >=60)
GLUCOSE: 100 mg/dL — AB (ref 65–99)
Globulin: 3.1 g/dL (calc) (ref 1.9–3.7)
Potassium: 4.6 mmol/L (ref 3.5–5.3)
Sodium: 137 mmol/L (ref 135–146)
TOTAL PROTEIN: 7.3 g/dL (ref 6.1–8.1)
Total Bilirubin: 0.6 mg/dL (ref 0.2–1.2)

## 2018-07-04 LAB — CBC WITH DIFFERENTIAL/PLATELET
ABSOLUTE MONOCYTES: 302 {cells}/uL (ref 200–950)
BASOS ABS: 48 {cells}/uL (ref 0–200)
Basophils Relative: 0.9 %
EOS ABS: 122 {cells}/uL (ref 15–500)
EOS PCT: 2.3 %
HEMATOCRIT: 38.2 % (ref 35.0–45.0)
HEMOGLOBIN: 13 g/dL (ref 11.7–15.5)
LYMPHS ABS: 1627 {cells}/uL (ref 850–3900)
MCH: 29.8 pg (ref 27.0–33.0)
MCHC: 34 g/dL (ref 32.0–36.0)
MCV: 87.6 fL (ref 80.0–100.0)
MPV: 9.4 fL (ref 7.5–12.5)
Monocytes Relative: 5.7 %
NEUTROS ABS: 3201 {cells}/uL (ref 1500–7800)
NEUTROS PCT: 60.4 %
Platelets: 321 10*3/uL (ref 140–400)
RBC: 4.36 10*6/uL (ref 3.80–5.10)
RDW: 12.1 % (ref 11.0–15.0)
Total Lymphocyte: 30.7 %
WBC: 5.3 10*3/uL (ref 3.8–10.8)

## 2018-07-04 LAB — URINE CULTURE
MICRO NUMBER:: 71369
SPECIMEN QUALITY:: ADEQUATE

## 2018-07-04 LAB — TSH: TSH: 2.14 m[IU]/L (ref 0.40–4.50)

## 2018-07-04 LAB — BRAIN NATRIURETIC PEPTIDE: BRAIN NATRIURETIC PEPTIDE: 105 pg/mL — AB (ref <100)

## 2018-08-09 ENCOUNTER — Other Ambulatory Visit: Payer: Self-pay | Admitting: Family Medicine

## 2018-08-11 MED ORDER — CLORAZEPATE DIPOTASSIUM 7.5 MG PO TABS: 7.5000 mg | ORAL_TABLET | Freq: Every day | ORAL | 1 refills | Status: DC | PRN

## 2018-08-11 NOTE — Telephone Encounter (Signed)
Received fax refill request for Clorazepate.  Discussed this medication previously.  Refill sent electronically to [facility]. PDMP reviewed during this encounter.

## 2018-08-21 ENCOUNTER — Ambulatory Visit (INDEPENDENT_AMBULATORY_CARE_PROVIDER_SITE_OTHER): Payer: Medicare Other | Admitting: Family Medicine

## 2018-08-21 ENCOUNTER — Encounter: Payer: Self-pay | Admitting: Family Medicine

## 2018-08-21 VITALS — BP 108/70 | HR 65 | Temp 98.1°F | Ht 63.5 in | Wt 152.0 lb

## 2018-08-21 DIAGNOSIS — R109 Unspecified abdominal pain: Secondary | ICD-10-CM

## 2018-08-21 DIAGNOSIS — I700 Atherosclerosis of aorta: Secondary | ICD-10-CM

## 2018-08-21 DIAGNOSIS — J00 Acute nasopharyngitis [common cold]: Secondary | ICD-10-CM | POA: Diagnosis not present

## 2018-08-21 DIAGNOSIS — I771 Stricture of artery: Secondary | ICD-10-CM

## 2018-08-21 DIAGNOSIS — K551 Chronic vascular disorders of intestine: Secondary | ICD-10-CM

## 2018-08-21 MED ORDER — SUCRALFATE 1 G PO TABS: 1.0000 g | ORAL_TABLET | Freq: Four times a day (QID) | ORAL | 1 refills | Status: DC | PRN

## 2018-08-21 MED ORDER — CLORAZEPATE DIPOTASSIUM 7.5 MG PO TABS: 7.5000 mg | ORAL_TABLET | Freq: Every day | ORAL | 0 refills | Status: DC | PRN

## 2018-08-21 MED ORDER — METOCLOPRAMIDE HCL 10 MG PO TABS: 10.0000 mg | ORAL_TABLET | Freq: Four times a day (QID) | ORAL | 1 refills | Status: DC | PRN

## 2018-08-21 NOTE — Progress Notes (Signed)
Traci [name] is a 73 y.o. female who presents to [place]: [place] today for abdominal cramping runny nose nausea.  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  She is recovering quite well from this but whenever she gets sick she tends to have GI symptoms.  She notes she is been sick with a runny nose over the last week and notes lots of abdominal cramping and stomach upset.  She denies any vomiting or diarrhea.  She denies severe abdominal pain.  She notes her symptoms are consistent with previous episodes of stomach upset.  In the past she is done quite well with Reglan.  Additionally she is used Carafate at times which is helped as well.  She think she could benefit from a refill.  Additionally she notes runny nose and mild nasal congestion.  She has been using over-the-counter medications which has been helpful.  She denies any cough shortness of breath significant chest pain or palpitations.  Additionally patient notes that her clorazepate prescription was sent electronically to her mail order pharmacy.  The prescription was mailed but is not currently lost in transit with [employer].  She has been advised by her mail order pharmacy to have a one-week supply printed while the 90-day supply is in transit. ROS as above:  Exam:  BP 108/70   Pulse 65   Temp 98.1 F (36.7 C) (Oral)   Ht 5' 3.5" (1.613 m)   Wt 152 lb (68.9 kg)   BMI 26.50 kg/m  Wt Readings from Last 5 Encounters:  08/21/18 152 lb (68.9 kg)  07/03/18 157 lb 12.8 oz (71.6 kg)  06/15/18 153 lb (69.4 kg)  05/27/18 148 lb (67.1 kg)  05/05/18 149 lb (67.6 kg)    Gen: Well NAD HEENT: EOMI,  MMM clear nasal discharge with inflamed nasal turbinates bilaterally.  Posterior pharynx mild cobblestoning.  Nontender maxillary sinuses.  Normal tympanic membranes  bilaterally. Lungs: Normal work of breathing. CTABL Heart: RRR no MRG Abd: NABS, Soft. Nondistended, minimally tender throughout however worse right upper quadrant with no rebound or guarding or masses palpated. Exts: Brisk capillary refill, warm and well perfused.      Assessment and Plan: 73 y.o. female with  Abdominal cramping.  Typical for her with viral illness.  Plan to use Reglan and Carafate as needed.  Continue supportive management watchful waiting recheck as needed.  Rhinitis.  Likely viral URI.  Watchful waiting symptomatic management with over-the-counter medications as tolerated.  Clorazepate 7-day refill.  Resume E prescribing in the future.  PDMP reviewed during this encounter. No orders of the defined types were placed in this encounter.  Meds ordered this encounter  Medications  . clorazepate (TRANXENE) 7.5 MG tablet    Sig: Take 1 tablet (7.5 mg total) by mouth daily as needed.    Dispense:  7 tablet    Refill:  0    [facility]  . metoCLOPramide (REGLAN) 10 MG tablet    Sig: Take 1 tablet (10 mg total) by mouth every 6 (six) hours as needed for nausea (nausea/headache).    Dispense:  90 tablet    Refill:  1  . sucralfate (CARAFATE) 1 g tablet    Sig: Take 1 tablet (1 g total) by mouth 4 (four) times daily as needed (gastritis).    Dispense:  60 tablet    Refill:  1     Historical information moved to  improve visibility of documentation.  Past Medical History:  Diagnosis Date  . Anxiety   . Atrial fibrillation ([place])   . CAD (coronary artery disease)   . Claudication ([place]) 01/06/2008   Lower extremity dopplers - no evidence of arterial insufficiency, normal exam  . Coronary artery disease   . GERD (gastroesophageal reflux disease)   . Hyperlipidemia   . Hypertension 11/30/2010   echo- EF 55%; normal w/ mildly sclerotic aortic valve  . Hypertension 11/05/11   renal dopplers - celiac artery and SMA >50% diameter reduction, R renal artery - mildly  elevated velocities 1-59% diameter reduction, L renal artery normal  . Nonspecific ST-T wave electrocardiographic changes 03/26/2011   R/Lmv - EF 74%, normal perfusion all regions, ST depression w/ Lexiscan infusion w/o assoc angina  . Osteopenia   . PAD (peripheral artery disease) ([place])   . Peripheral vascular disease ([place])   . PVD (peripheral vascular disease) ([place]) 11/05/2011   doppler - R/L brachial pressures essentially equal w/o inflow disease; L sublclavian/CCA bypass graft demonstrates patent flow, no evidence of significant stenosis  . Sigmoid diverticulitis    Past Surgical History:  Procedure Laterality Date  . ABDOMINAL AORTIC ANEURYSM REPAIR N/A 01/27/2014   Procedure: AORTO-SUPERIOR MESENTERIC ARTERY BYPASS GRAFT;  Surgeon: Angelia Mould, MD;  Location: [facility];  Service: Vascular;  Laterality: N/A;  . ABDOMINAL HYSTERECTOMY    . APPENDECTOMY    . CARDIAC CATHETERIZATION  06/03/2008   60% LAD, involving D2, borderline significant by IVUS, medical therapy. CFX, RCA OK.  Marland Kitchen CATARACT EXTRACTION    . CHOLECYSTECTOMY    . EYE SURGERY     retinal surgery  . SPINE SURGERY  Feb. 2011  . [place]   X's  several  . SUBCLAVIAN ARTERY STENT Left 09/20/2008   LSA ISR 7x72mm Cordis Genesis on Opta premount, reduction from 80% to 0%  . subclavian artery stents  01/23/2010   Left carotid to subclavian artery Bypass  . VISCERAL ANGIOGRAM  01/17/2014   Procedure: VISCERAL ANGIOGRAM;  Surgeon: Angelia Mould, MD;  Location: [facility] CATH LAB;  Service: Cardiovascular;;   Social History   Tobacco Use  . Smoking status: Former Smoker    Packs/day: 0.25    Years: 20.00    Pack years: 5.00    Types: Cigarettes    Last attempt to quit: 09/30/1993    Years since quitting: 24.9  . Smokeless tobacco: Never Used  Substance Use Topics  . Alcohol use: No   family history includes Diabetes in her maternal grandmother; Heart attack in her father; Heart disease in her  paternal grandfather; Heart disease (age of onset: 79) in her father; Hyperlipidemia in her father; Hypertension in her father; Kidney disease in her father; Kidney failure in her mother.  Medications: Current Outpatient Medications  Medication Sig Dispense Refill  . aspirin EC 81 MG tablet Take 81 mg by mouth daily.    Marland Kitchen atenolol (TENORMIN) 25 MG tablet Take 1 tablet (25 mg total) by mouth daily. 90 tablet 3  . atorvastatin (LIPITOR) 10 MG tablet TAKE 1 TABLET DAILY AT 6 P.M. 90 tablet 4  . candesartan (ATACAND) 16 MG tablet Take 0.5 tablets (8 mg total) by mouth daily. 45 tablet 3  . cholecalciferol (VITAMIN D) 1000 UNITS tablet Take 1,000 Units by mouth daily.    . clopidogrel (PLAVIX) 75 MG tablet TAKE 1 TABLET DAILY 90 tablet 4  . clorazepate (TRANXENE) 7.5 MG tablet Take 1 tablet (  7.5 mg total) by mouth daily as needed. 7 tablet 0  . cyanocobalamin 1000 MCG tablet Take 100 mcg by mouth daily.    Marland Kitchen EPINEPHrine (EPIPEN 2-PAK) 0.3 mg/0.3 mL IJ SOAJ injection Inject 0.3 mLs (0.3 mg total) into the muscle as needed (for allergic reaction). 2 Device 0  . esomeprazole (NEXIUM) 40 MG capsule Take 1 capsule (40 mg total) by mouth daily. Take 1 tab daily (Patient taking differently: Take 40 mg by mouth daily. ) 90 capsule 0  . estradiol (ESTRACE) 0.5 MG tablet TAKE 1 TABLET DAILY 90 tablet 1  . furosemide (LASIX) 20 MG tablet TAKE 1 TABLET EVERY OTHER DAY (Patient taking differently: Take 20 mg by mouth 3 (three) times a week. ) 45 tablet 6  . linaclotide (LINZESS) 145 MCG CAPS capsule Take 1 capsule by mouth daily as needed.    . meclizine (ANTIVERT) 25 MG tablet TAKE ONE-HALF (1/2) TABLET THREE TIMES A DAY AS NEEDED FOR DIZZINESS OR NAUSEA 90 tablet 6  . metoCLOPramide (REGLAN) 10 MG tablet Take 1 tablet (10 mg total) by mouth every 6 (six) hours as needed for nausea (nausea/headache). 90 tablet 1  . Multiple Vitamin (MULTIVITAMIN WITH MINERALS) TABS Take 1 tablet by mouth daily.    . sucralfate  (CARAFATE) 1 g tablet Take 1 tablet (1 g total) by mouth 4 (four) times daily as needed (gastritis). 60 tablet 1  . zolpidem (AMBIEN) 10 MG tablet Take 1 tablet (10 mg total) by mouth at bedtime as needed for sleep. 90 tablet 1   No current facility-administered medications for this visit.    Allergies  Allergen Reactions  . Cortisone Anaphylaxis  . Dilaudid [Hydromorphone Hcl] Nausea And Vomiting    Pt states she will start vomiting immediately for 6 hours  . Iodine Anaphylaxis  . Medrol [Methylprednisolone] Anaphylaxis  . Omnipaque [Iohexol] Anaphylaxis  . Prednisone Anaphylaxis and Swelling  . Shellfish Allergy Anaphylaxis  . Sulfa Drugs Cross Reactors Anaphylaxis  . Methylprednisolone Sodium Succ Swelling    Blood pressure drops immediately  . Sulfa Antibiotics Other (See Comments)    Stomach upset  . Augmentin [Amoxicillin-Pot Clavulanate] Nausea Only    Upset stomach (Has taken cephalexin in the past without problems)  . Codeine Rash and Other (See Comments)    Upset stomach  . Erythromycin Rash  . Morphine And Related Nausea Only    nausea     Discussed warning signs or symptoms. Please see discharge instructions. Patient expresses understanding.

## 2018-08-21 NOTE — Patient Instructions (Signed)
Thank you for coming in today. Use reglan as needed.  Continue over the counter medicines as needed.  Continue hot water and lemon.   Call or go to the emergency room if you get worse, have trouble breathing, have chest pains, or palpitations.   If your belly pain worsens, or you have high fever, bad vomiting, blood in your stool or black tarry stool go to the Emergency Room.

## 2018-08-29 ENCOUNTER — Other Ambulatory Visit: Payer: Self-pay | Admitting: Family Medicine

## 2018-08-31 DIAGNOSIS — K559 Vascular disorder of intestine, unspecified: Secondary | ICD-10-CM | POA: Diagnosis not present

## 2018-08-31 DIAGNOSIS — K219 Gastro-esophageal reflux disease without esophagitis: Secondary | ICD-10-CM | POA: Diagnosis not present

## 2018-09-30 ENCOUNTER — Ambulatory Visit: Payer: Medicare Other | Admitting: Vascular Surgery

## 2018-09-30 ENCOUNTER — Encounter (HOSPITAL_COMMUNITY): Payer: Medicare Other

## 2018-11-05 ENCOUNTER — Encounter (HOSPITAL_COMMUNITY): Payer: Medicare Other

## 2018-11-21 ENCOUNTER — Emergency Department (INDEPENDENT_AMBULATORY_CARE_PROVIDER_SITE_OTHER)
Admission: EM | Admit: 2018-11-21 | Discharge: 2018-11-21 | Disposition: A | Discharge status: Home / Self Care | Payer: Medicare Other | Source: Home / Self Care | Attending: Family Medicine | Admitting: Family Medicine

## 2018-11-21 ENCOUNTER — Other Ambulatory Visit: Payer: Self-pay

## 2018-11-21 DIAGNOSIS — R51 Headache: Secondary | ICD-10-CM

## 2018-11-21 DIAGNOSIS — R519 Headache, unspecified: Secondary | ICD-10-CM

## 2018-11-21 MED ORDER — AMOXICILLIN 875 MG PO TABS: 875.0000 mg | ORAL_TABLET | Freq: Two times a day (BID) | ORAL | 0 refills | Status: DC

## 2018-11-21 NOTE — Discharge Instructions (Addendum)
May take Tylenol as needed for pain. 

## 2018-11-21 NOTE — ED Provider Notes (Signed)
[name]    CSN: [id number] Arrival date & time: 11/21/18  1537     History   Chief Complaint Chief Complaint  Patient presents with  . Facial Pain  . Otalgia    LT    HPI Traci [name] is a 73 y.o. female.   Patient complains of onset of pain and decreased hearing in her left ear 2.5 days ago.  The pain then spread to her left face and left neck.  She feels like there is some mild swelling in her left cheek and that she may have a sinus congestion.  She admits that her left ear feels clogged with decreased hearing, and she has tinnitus at times.  She denies fevers, chills, and sweats.  She denies significant sinus congestion, and no URI symptoms.  The history is provided by the patient.  Otalgia  Location:  Left Behind ear:  No abnormality Quality:  Aching and burning Severity:  Mild Onset quality:  Sudden Duration:  3 days Timing:  Constant Progression:  Worsening Chronicity:  New Context: not recent URI and not water in ear   Relieved by:  Nothing Worsened by:  Nothing Ineffective treatments:  None tried Associated symptoms: headaches, hearing loss, neck pain and tinnitus   Associated symptoms: no congestion, no cough, no ear discharge, no fever, no rash, no rhinorrhea and no sore throat   Risk factors: no recent travel     Past Medical History:  Diagnosis Date  . Anxiety   . Atrial fibrillation ([place])   . CAD (coronary artery disease)   . Claudication ([place]) 01/06/2008   Lower extremity dopplers - no evidence of arterial insufficiency, normal exam  . Coronary artery disease   . GERD (gastroesophageal reflux disease)   . Hyperlipidemia   . Hypertension 11/30/2010   echo- EF 55%; normal w/ mildly sclerotic aortic valve  . Hypertension 11/05/11   renal dopplers - celiac artery and SMA >50% diameter reduction, R renal artery - mildly elevated velocities 1-59% diameter reduction, L renal artery normal  . Nonspecific ST-T wave  electrocardiographic changes 03/26/2011   R/Lmv - EF 74%, normal perfusion all regions, ST depression w/ Lexiscan infusion w/o assoc angina  . Osteopenia   . PAD (peripheral artery disease) ([place])   . Peripheral vascular disease ([place])   . PVD (peripheral vascular disease) ([place]) 11/05/2011   doppler - R/L brachial pressures essentially equal w/o inflow disease; L sublclavian/CCA bypass graft demonstrates patent flow, no evidence of significant stenosis  . Sigmoid diverticulitis     Patient Active Problem List   Diagnosis Date Noted  . Atherosclerosis of aorta ([place]) 01/04/2018  . Osteoporosis 12/25/2017  . CKD (chronic kidney disease) stage 3, GFR 30-59 ml/min (HCC) 08/04/2017  . Primary osteoarthritis of first carpometacarpal joint of left hand 07/10/2017  . Strain of right Achilles tendon 04/14/2017  . Atypical nevus 08/22/2016  . Acute bilateral low back pain with left-sided sciatica 06/06/2016  . Osteopenia 12/13/2015  . Menopause 12/08/2015  . History of colonoscopy 07/17/2015  . Irregular heartbeat 07/26/2014  . 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 Preoperative evaluation of a medical condition to rule out surgical contraindications (TAR required) 01/17/2014  . 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[place]) 01/16/2014  . Hyperlipidemia 04/05/2013  . Hypertension 12/06/2012  . Peripheral arterial disease ([place]) 12/06/2012  . CAD, cath 2009 12/06/2012  . Hyponatremia, improved holding diuretic 12/06/2012  . Atherosclerosis of other specified arteries 04/02/2012  . 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 Stricture  of artery ([place]) 10/02/2011    Past Surgical History:  Procedure Laterality Date  . ABDOMINAL AORTIC ANEURYSM REPAIR N/A 01/27/2014   Procedure: AORTO-SUPERIOR MESENTERIC ARTERY BYPASS GRAFT;  Surgeon: Angelia Mould, MD;  Location: [place];  Service: Vascular;  Laterality: N/A;  . ABDOMINAL HYSTERECTOMY    . APPENDECTOMY    . CARDIAC CATHETERIZATION  06/03/2008   60%  LAD, involving D2, borderline significant by IVUS, medical therapy. CFX, RCA OK.  Marland Kitchen CATARACT EXTRACTION    . CHOLECYSTECTOMY    . EYE SURGERY     retinal surgery  . SPINE SURGERY  Feb. 2011  . [place]   X's  several  . SUBCLAVIAN ARTERY STENT Left 09/20/2008   LSA ISR 7x15mm Cordis Genesis on Opta premount, reduction from 80% to 0%  . subclavian artery stents  01/23/2010   Left carotid to subclavian artery Bypass  . VISCERAL ANGIOGRAM  01/17/2014   Procedure: VISCERAL ANGIOGRAM;  Surgeon: Angelia Mould, MD;  Location: [facility] CATH LAB;  Service: Cardiovascular;;    OB History   No obstetric history on file.      Home Medications    Prior to Admission medications   Medication Sig Start Date End Date Taking? Authorizing Provider  amoxicillin (AMOXIL) 875 MG tablet Take 1 tablet (875 mg total) by mouth 2 (two) times daily. 11/21/18   Kandra Nicolas, MD  aspirin EC 81 MG tablet Take 81 mg by mouth daily.    [provider]  atenolol (TENORMIN) 25 MG tablet Take 1 tablet (25 mg total) by mouth daily. 05/11/18   Croitoru, Mihai, MD  atorvastatin (LIPITOR) 10 MG tablet TAKE 1 TABLET DAILY AT 6 P.M. 05/08/18   Croitoru, Mihai, MD  candesartan (ATACAND) 16 MG tablet Take 0.5 tablets (8 mg total) by mouth daily. 05/11/18   Croitoru, Mihai, MD  cholecalciferol (VITAMIN D) 1000 UNITS tablet Take 1,000 Units by mouth daily.    [provider]  clopidogrel (PLAVIX) 75 MG tablet TAKE 1 TABLET DAILY 05/08/18   Croitoru, Mihai, MD  clorazepate (TRANXENE) 7.5 MG tablet Take 1 tablet (7.5 mg total) by mouth daily as needed. 08/21/18   Gregor Hams, MD  cyanocobalamin 1000 MCG tablet Take 100 mcg by mouth daily.    [provider]  EPINEPHrine (EPIPEN 2-PAK) 0.3 mg/0.3 mL IJ SOAJ injection Inject 0.3 mLs (0.3 mg total) into the muscle as needed (for allergic reaction). 12/23/17   Gregor Hams, MD  esomeprazole (NEXIUM) 40 MG capsule Take 1 capsule (40 mg  total) by mouth daily. Take 1 tab daily Patient taking differently: Take 40 mg by mouth daily.  02/23/15   Croitoru, Mihai, MD  estradiol (ESTRACE) 0.5 MG tablet TAKE 1 TABLET DAILY 08/31/18   Gregor Hams, MD  furosemide (LASIX) 20 MG tablet TAKE 1 TABLET EVERY OTHER DAY Patient taking differently: Take 20 mg by mouth 3 (three) times a week.  06/11/18   Croitoru, Mihai, MD  linaclotide (LINZESS) 145 MCG CAPS capsule Take 1 capsule by mouth daily as needed. 12/22/15   [provider]  meclizine (ANTIVERT) 25 MG tablet TAKE ONE-HALF (1/2) TABLET THREE TIMES A DAY AS NEEDED FOR DIZZINESS OR NAUSEA 08/11/18   Gregor Hams, MD  metoCLOPramide (REGLAN) 10 MG tablet Take 1 tablet (10 mg total) by mouth every 6 (six) hours as needed for nausea (nausea/headache). 08/21/18   Gregor Hams, MD  Multiple Vitamin (MULTIVITAMIN WITH MINERALS) TABS Take  1 tablet by mouth daily.    [provider]  sucralfate (CARAFATE) 1 g tablet Take 1 tablet (1 g total) by mouth 4 (four) times daily as needed (gastritis). 08/21/18   Gregor Hams, MD  zolpidem (AMBIEN) 10 MG tablet Take 1 tablet (10 mg total) by mouth at bedtime as needed for sleep. 12/26/17   Gregor Hams, MD    Family History Family History  Problem Relation Age of Onset  . Heart disease Father 37       Heart Disease before age 79  . Kidney disease Father   . Heart attack Father   . Hyperlipidemia Father   . Hypertension Father   . Kidney failure Mother   . Diabetes Maternal Grandmother   . Heart disease Paternal Grandfather     Social History Social History   Tobacco Use  . Smoking status: Former Smoker    Packs/day: 0.25    Years: 20.00    Pack years: 5.00    Types: Cigarettes    Last attempt to quit: 09/30/1993    Years since quitting: 25.1  . Smokeless tobacco: Never Used  Substance Use Topics  . Alcohol use: No  . Drug use: No     Allergies   Cortisone; Dilaudid [hydromorphone hcl]; Iodine; Medrol  [methylprednisolone]; Omnipaque [iohexol]; Prednisone; Shellfish allergy; Sulfa drugs cross reactors; Methylprednisolone sodium succ; Sulfa antibiotics; Augmentin [amoxicillin-pot clavulanate]; Codeine; Erythromycin; and Morphine and related   Review of Systems Review of Systems  Constitutional: Negative for chills, diaphoresis, fatigue and fever.  HENT: Positive for ear pain, hearing loss and tinnitus. Negative for congestion, ear discharge, rhinorrhea and sore throat.   Eyes: Negative.   Respiratory: Negative for cough.   Cardiovascular: Negative.   Gastrointestinal: Negative.   Genitourinary: Negative.   Musculoskeletal: Positive for neck pain.  Skin: Negative for rash.  Neurological: Positive for headaches. Negative for dizziness, facial asymmetry and light-headedness.     Physical Exam Triage Vital Signs ED Triage Vitals [11/21/18 1548]  Enc Vitals Group     BP (!) 173/79     Pulse Rate 80     Resp 18     Temp 97.8 F (36.6 C)     Temp Source Oral     SpO2 99 %     Weight 153 lb (69.4 kg)     Height 5' 3.5" (1.613 m)     Head Circumference      Peak Flow      Pain Score 0     Pain Loc      Pain Edu?      Excl. in [place]?    No data found.  Updated Vital Signs BP (!) 173/79 (BP Location: Right Arm)   Pulse 80   Temp 97.8 F (36.6 C) (Oral)   Resp 18   Ht 5' 3.5" (1.613 m)   Wt 69.4 kg   SpO2 99%   BMI 26.68 kg/m   Visual Acuity Right Eye Distance:   Left Eye Distance:   Bilateral Distance:    Right Eye Near:   Left Eye Near:    Bilateral Near:     Physical Exam Vitals signs and nursing note reviewed.  Constitutional:      General: She is not in acute distress. HENT:     Head: Normocephalic.      Comments: Right canal occluded with cerumen; unable to visualize tympanic membrane.  Patient has mild tenderness to palpation left face and cheek, as noted on  diagram, although there is no swelling, erythema, or warmth there.    Right Ear: External ear  normal.     Left Ear: Tympanic membrane, ear canal and external ear normal.     Nose: Nose normal. No congestion.     Mouth/Throat:     Pharynx: Oropharynx is clear.  Eyes:     Extraocular Movements: Extraocular movements intact.     Conjunctiva/sclera: Conjunctivae normal.     Pupils: Pupils are equal, round, and reactive to light.  Neck:     Musculoskeletal: Normal range of motion and neck supple.     Comments: Tenderness left lateral nodes. Cardiovascular:     Rate and Rhythm: Normal rate.  Pulmonary:     Effort: Pulmonary effort is normal.  Skin:    General: Skin is warm and dry.  Neurological:     General: No focal deficit present.     Mental Status: She is alert.      UC Treatments / Results  Labs (all labs ordered are listed, but only abnormal results are displayed) Labs Reviewed - No data to display  EKG None  Radiology No results found.  Procedures Procedures (including critical care time)  Medications Ordered in UC Medications - No data to display  Initial Impression / Assessment and Plan / UC Course  I have reviewed the triage vital signs and the nursing notes.  Pertinent labs & imaging results that were available during my care of the patient were reviewed by me and considered in my medical decision making (see chart for details).    Will begin empiric amoxicillin for ?maxillary sinusitis (she notes that she can take amoxicillin without adverse effects). Also consider ?trigeminal neuralgia.  Doubt herpes zoster (no rash) Recommend ENT evaluation if not improving about 4 to 5 days.  Final Clinical Impressions(s) / UC Diagnoses   Final diagnoses:  Left facial pain     Discharge Instructions     May take Tylenol as needed for pain.    ED Prescriptions    Medication Sig Dispense Auth. Provider   amoxicillin (AMOXIL) 875 MG tablet Take 1 tablet (875 mg total) by mouth 2 (two) times daily. 20 tablet Kandra Nicolas, MD         Kandra Nicolas, MD 11/21/18 [phone]

## 2018-11-21 NOTE — ED Triage Notes (Signed)
Pt c/o sinus pain/pressure x 3 days. Also pain in LT ear that radiates down neck. No OTC meds tried.

## 2018-11-30 ENCOUNTER — Other Ambulatory Visit: Payer: Self-pay | Admitting: Family Medicine

## 2018-11-30 DIAGNOSIS — Z1231 Encounter for screening mammogram for malignant neoplasm of breast: Secondary | ICD-10-CM

## 2018-12-14 ENCOUNTER — Other Ambulatory Visit: Payer: Self-pay

## 2018-12-14 DIAGNOSIS — I6523 Occlusion and stenosis of bilateral carotid arteries: Secondary | ICD-10-CM

## 2018-12-16 ENCOUNTER — Ambulatory Visit (HOSPITAL_COMMUNITY)
Admission: RE | Admit: 2018-12-16 | Discharge: 2018-12-16 | Disposition: A | Discharge status: Home / Self Care | Payer: Medicare Other | Source: Ambulatory Visit | Attending: Family | Admitting: Family

## 2018-12-16 ENCOUNTER — Encounter: Payer: Self-pay | Admitting: Vascular Surgery

## 2018-12-16 ENCOUNTER — Other Ambulatory Visit: Payer: Self-pay

## 2018-12-16 ENCOUNTER — Ambulatory Visit (INDEPENDENT_AMBULATORY_CARE_PROVIDER_SITE_OTHER): Payer: Medicare Other | Admitting: Vascular Surgery

## 2018-12-16 VITALS — BP 145/61 | HR 70 | Temp 97.6°F | Resp 20 | Ht 63.5 in | Wt 153.0 lb

## 2018-12-16 DIAGNOSIS — Z48812 Encounter for surgical aftercare following surgery on the circulatory system: Secondary | ICD-10-CM

## 2018-12-16 DIAGNOSIS — I6523 Occlusion and stenosis of bilateral carotid arteries: Secondary | ICD-10-CM

## 2018-12-16 NOTE — Progress Notes (Signed)
Patient name: Traci [name] MRN: [id number] DOB: 09/22/1945 Sex: female  REASON FOR VISIT:   Follow-up of multiple vascular issues  HPI:   Traci [name] is a pleasant 73 y.o. female who is undergone a previous left carotid subclavian bypass.  In addition she has had a aorto to superior mesenteric artery bypass in 2015.  I last saw her on 02/02/2018.  At that time she was having some abdominal pain but her history is not consistent with mesenteric ischemia.  Mesenteric duplex scan showed that her aorto to SMA bypass graft was patent.  The celiac axis was also patent without evidence of stenosis.  She comes in for routine follow-up visit.  Since I saw her last she has been doing well except still occasionally has some abdominal pain after eating but has not lost any weight.  She denies any history of stroke.  She had one brief episode where she had a headaches and some tingling on the left side of her face but is had no focal weakness or paresthesias.  She is on aspirin, Plavix, and a statin.  She denies any history of claudication, rest pain, or nonhealing ulcers.  Past Medical History:  Diagnosis Date  . Anxiety   . Atrial fibrillation ([place])   . CAD (coronary artery disease)   . Claudication ([place]) 01/06/2008   Lower extremity dopplers - no evidence of arterial insufficiency, normal exam  . Coronary artery disease   . GERD (gastroesophageal reflux disease)   . Hyperlipidemia   . Hypertension 11/30/2010   echo- EF 55%; normal w/ mildly sclerotic aortic valve  . Hypertension 11/05/11   renal dopplers - celiac artery and SMA >50% diameter reduction, R renal artery - mildly elevated velocities 1-59% diameter reduction, L renal artery normal  . Nonspecific ST-T wave electrocardiographic changes 03/26/2011   R/Lmv - EF 74%, normal perfusion all regions, ST depression w/ Lexiscan infusion w/o assoc angina  . Osteopenia   . PAD (peripheral artery disease) ([place])   .  Peripheral vascular disease ([place])   . PVD (peripheral vascular disease) ([place]) 11/05/2011   doppler - R/L brachial pressures essentially equal w/o inflow disease; L sublclavian/CCA bypass graft demonstrates patent flow, no evidence of significant stenosis  . Sigmoid diverticulitis     Family History  Problem Relation Age of Onset  . Heart disease Father 73       Heart Disease before age 57  . Kidney disease Father   . Heart attack Father   . Hyperlipidemia Father   . Hypertension Father   . Kidney failure Mother   . Diabetes Maternal Grandmother   . Heart disease Paternal Grandfather     SOCIAL HISTORY: Social History   Tobacco Use  . Smoking status: Former Smoker    Packs/day: 0.25    Years: 20.00    Pack years: 5.00    Types: Cigarettes    Quit date: 09/30/1993    Years since quitting: 25.2  . Smokeless tobacco: Never Used  Substance Use Topics  . Alcohol use: No    Allergies  Allergen Reactions  . Cortisone Anaphylaxis  . Dilaudid [Hydromorphone Hcl] Nausea And Vomiting    Pt states she will start vomiting immediately for 6 hours  . Iodine Anaphylaxis  . Medrol [Methylprednisolone] Anaphylaxis  . Omnipaque [Iohexol] Anaphylaxis  . Prednisone Anaphylaxis and Swelling  . Shellfish Allergy Anaphylaxis  . Sulfa Drugs Cross Reactors Anaphylaxis  . Methylprednisolone Sodium Succ Swelling    Blood pressure drops  immediately  . Sulfa Antibiotics Other (See Comments)    Stomach upset  . Augmentin [Amoxicillin-Pot Clavulanate] Nausea Only    Upset stomach (Has taken cephalexin in the past without problems)  . Codeine Rash and Other (See Comments)    Upset stomach  . Erythromycin Rash  . Morphine And Related Nausea Only    nausea    Current Outpatient Medications  Medication Sig Dispense Refill  . aspirin EC 81 MG tablet Take 81 mg by mouth daily.    Marland Kitchen atenolol (TENORMIN) 25 MG tablet Take 1 tablet (25 mg total) by mouth daily. 90 tablet 3  . atorvastatin  (LIPITOR) 10 MG tablet TAKE 1 TABLET DAILY AT 6 P.M. 90 tablet 4  . candesartan (ATACAND) 16 MG tablet Take 0.5 tablets (8 mg total) by mouth daily. 45 tablet 3  . cholecalciferol (VITAMIN D) 1000 UNITS tablet Take 1,000 Units by mouth daily.    . clopidogrel (PLAVIX) 75 MG tablet TAKE 1 TABLET DAILY 90 tablet 4  . clorazepate (TRANXENE) 7.5 MG tablet Take 1 tablet (7.5 mg total) by mouth daily as needed. 7 tablet 0  . cyanocobalamin 1000 MCG tablet Take 100 mcg by mouth daily.    Marland Kitchen EPINEPHrine (EPIPEN 2-PAK) 0.3 mg/0.3 mL IJ SOAJ injection Inject 0.3 mLs (0.3 mg total) into the muscle as needed (for allergic reaction). 2 Device 0  . esomeprazole (NEXIUM) 40 MG capsule Take 1 capsule (40 mg total) by mouth daily. Take 1 tab daily (Patient taking differently: Take 40 mg by mouth daily. ) 90 capsule 0  . estradiol (ESTRACE) 0.5 MG tablet TAKE 1 TABLET DAILY 90 tablet 3  . furosemide (LASIX) 20 MG tablet TAKE 1 TABLET EVERY OTHER DAY (Patient taking differently: Take 20 mg by mouth 3 (three) times a week. ) 45 tablet 6  . linaclotide (LINZESS) 145 MCG CAPS capsule Take 1 capsule by mouth daily as needed.    . meclizine (ANTIVERT) 25 MG tablet TAKE ONE-HALF (1/2) TABLET THREE TIMES A DAY AS NEEDED FOR DIZZINESS OR NAUSEA 90 tablet 6  . Multiple Vitamin (MULTIVITAMIN WITH MINERALS) TABS Take 1 tablet by mouth daily.    Marland Kitchen zolpidem (AMBIEN) 10 MG tablet Take 1 tablet (10 mg total) by mouth at bedtime as needed for sleep. 90 tablet 1   No current facility-administered medications for this visit.     REVIEW OF SYSTEMS:  [X]  denotes positive finding, [ ]  denotes negative finding Cardiac  Comments:  Chest pain or chest pressure:    Shortness of breath upon exertion:    Short of breath when lying flat:    Irregular heart rhythm:        Vascular    Pain in calf, thigh, or hip brought on by ambulation:    Pain in feet at night that wakes you up from your sleep:     Blood clot in your veins:    Leg  swelling:         Pulmonary    Oxygen at home:    Productive cough:     Wheezing:         Neurologic    Sudden weakness in arms or legs:     Sudden numbness in arms or legs:     Sudden onset of difficulty speaking or slurred speech:    Temporary loss of vision in one eye:     Problems with dizziness:         Gastrointestinal    Blood in  stool:     Vomited blood:         Genitourinary    Burning when urinating:     Blood in urine:        Psychiatric    Major depression:         Hematologic    Bleeding problems:    Problems with blood clotting too easily:        Skin    Rashes or ulcers:        Constitutional    Fever or chills:     PHYSICAL EXAM:   Vitals:   12/16/18 1329 12/16/18 1332  BP: 135/64 (!) 145/61  Pulse: 70   Resp: 20   Temp: 97.6 F (36.4 C)   SpO2: 97%   Weight: 153 lb (69.4 kg)   Height: 5' 3.5" (1.613 m)     GENERAL: The patient is a well-nourished female, in no acute distress. The vital signs are documented above. CARDIAC: There is a regular rate and rhythm.  VASCULAR: She has a soft right carotid bruit. She has palpable pedal pulses bilaterally. PULMONARY: There is good air exchange bilaterally without wheezing or rales. ABDOMEN: Soft and non-tender with normal pitched bowel sounds.  MUSCULOSKELETAL: There are no major deformities or cyanosis. NEUROLOGIC: No focal weakness or paresthesias are detected. SKIN: There are no ulcers or rashes noted. PSYCHIATRIC: The patient has a normal affect.  DATA:    CAROTID DUPLEX: I have independently interpreted her carotid duplex scan.  She has a less than 39% carotid stenosis bilaterally.  Her left carotid subclavian bypass graft is patent.  Both vertebral arteries are patent with antegrade flow.  MEDICAL ISSUES:   S/P AORTO SMA BYPASS: At the time of her last study her aorto SMA bypass was patent.  Although she does have some occasional abdominal pain she has not lost any weight so I am not  suspicious of occlusion of her graft.  She had a wide-open celiac axis also.  S/P LEFT CAROTID SUBCLAVIAN BYPASS: Her left carotid subclavian bypass graft is patent.  She is asymptomatic from that standpoint.  She has no evidence of significant carotid disease on duplex.  She is on aspirin, a statin, and Plavix.  I think we can stretch her follow-up of her bypass and carotid to 2 years.  [name] [facility] [phone]

## 2018-12-24 ENCOUNTER — Ambulatory Visit (INDEPENDENT_AMBULATORY_CARE_PROVIDER_SITE_OTHER): Payer: Medicare Other

## 2018-12-24 ENCOUNTER — Other Ambulatory Visit: Payer: Self-pay

## 2018-12-24 DIAGNOSIS — Z1231 Encounter for screening mammogram for malignant neoplasm of breast: Secondary | ICD-10-CM

## 2018-12-31 ENCOUNTER — Telehealth (HOSPITAL_COMMUNITY): Payer: Self-pay

## 2018-12-31 NOTE — Telephone Encounter (Signed)
Encounter complete. 

## 2019-01-01 ENCOUNTER — Telehealth: Payer: Self-pay | Admitting: Cardiovascular Disease

## 2019-01-01 NOTE — Telephone Encounter (Signed)
Called patient. Informed her to hold atenolol 24 hours before nuclear stress test and to hold lasix the day of the test. She verbally understood. No further questions.

## 2019-01-01 NOTE — Telephone Encounter (Signed)
New message:   Patient calling stating she has a up coming appt and would like to know is there a medication that she should not be taking before the test. Please call patient concering this matter.

## 2019-01-01 NOTE — Telephone Encounter (Signed)
Lm to call back ./cy 

## 2019-01-05 ENCOUNTER — Other Ambulatory Visit: Payer: Self-pay

## 2019-01-05 ENCOUNTER — Ambulatory Visit (HOSPITAL_COMMUNITY)
Admission: RE | Admit: 2019-01-05 | Discharge: 2019-01-05 | Disposition: A | Discharge status: Home / Self Care | Payer: Medicare Other | Source: Ambulatory Visit | Attending: Cardiovascular Disease | Admitting: Cardiovascular Disease

## 2019-01-05 DIAGNOSIS — I2510 Atherosclerotic heart disease of native coronary artery without angina pectoris: Secondary | ICD-10-CM | POA: Insufficient documentation

## 2019-01-05 LAB — MYOCARDIAL PERFUSION IMAGING
LV dias vol: 77 mL (ref 46–106)
LV sys vol: 22 mL
Peak HR: 107 {beats}/min
Rest HR: 67 {beats}/min
SDS: 0
SRS: 0
SSS: 0
TID: 1.32

## 2019-01-05 MED ORDER — REGADENOSON 0.4 MG/5ML IV SOLN
0.4000 mg | Freq: Once | INTRAVENOUS | Status: AC
  Administered 2019-01-05: 0.4 mg via INTRAVENOUS

## 2019-01-05 MED ORDER — AMINOPHYLLINE 25 MG/ML IV SOLN
75.0000 mg | Freq: Once | INTRAVENOUS | Status: AC
  Administered 2019-01-05: 75 mg via INTRAVENOUS

## 2019-01-05 MED ORDER — TECHNETIUM TC 99M TETROFOSMIN IV KIT
10.8000 | PACK | Freq: Once | INTRAVENOUS | Status: AC | PRN
  Administered 2019-01-05: 10.8 via INTRAVENOUS
  Filled 2019-01-05: qty 11

## 2019-01-05 MED ORDER — TECHNETIUM TC 99M TETROFOSMIN IV KIT
30.4000 | PACK | Freq: Once | INTRAVENOUS | Status: AC | PRN
  Administered 2019-01-05: 30.4 via INTRAVENOUS
  Filled 2019-01-05: qty 31

## 2019-01-12 DIAGNOSIS — H00021 Hordeolum internum right upper eyelid: Secondary | ICD-10-CM | POA: Diagnosis not present

## 2019-01-15 ENCOUNTER — Encounter: Payer: Self-pay | Admitting: Physician Assistant

## 2019-01-15 ENCOUNTER — Other Ambulatory Visit: Payer: Self-pay

## 2019-01-15 ENCOUNTER — Ambulatory Visit (INDEPENDENT_AMBULATORY_CARE_PROVIDER_SITE_OTHER): Payer: Medicare Other | Admitting: Physician Assistant

## 2019-01-15 VITALS — BP 135/54 | HR 62 | Temp 98.6°F | Ht 64.0 in | Wt 153.0 lb

## 2019-01-15 DIAGNOSIS — B370 Candidal stomatitis: Secondary | ICD-10-CM

## 2019-01-15 DIAGNOSIS — I6523 Occlusion and stenosis of bilateral carotid arteries: Secondary | ICD-10-CM | POA: Diagnosis not present

## 2019-01-15 DIAGNOSIS — T50905A Adverse effect of unspecified drugs, medicaments and biological substances, initial encounter: Secondary | ICD-10-CM

## 2019-01-15 DIAGNOSIS — H01001 Unspecified blepharitis right upper eyelid: Secondary | ICD-10-CM

## 2019-01-15 DIAGNOSIS — L219 Seborrheic dermatitis, unspecified: Secondary | ICD-10-CM | POA: Diagnosis not present

## 2019-01-15 MED ORDER — NYSTATIN 100000 UNIT/ML MT SUSP: 500000.0000 [IU] | Freq: Four times a day (QID) | OROMUCOSAL | 0 refills | Status: DC

## 2019-01-15 MED ORDER — KETOCONAZOLE 2 % EX SHAM: MEDICATED_SHAMPOO | CUTANEOUS | 0 refills | Status: DC

## 2019-01-15 MED ORDER — FLUCONAZOLE 150 MG PO TABS: 150.0000 mg | ORAL_TABLET | Freq: Once | ORAL | 0 refills | Status: AC

## 2019-01-15 MED ORDER — CEFDINIR 300 MG PO CAPS: 300.0000 mg | ORAL_CAPSULE | Freq: Two times a day (BID) | ORAL | 0 refills | Status: DC

## 2019-01-15 NOTE — Progress Notes (Signed)
Subjective:    Patient ID: Traci [name], female    DOB: 1945/07/01, 73 y.o.   MRN: [id number]  HPI  Pt is a 73 yo female who presents to the clinic to follow up on right upper eyelid infection. She went to the eye doctor on Tuesday for her swollen, red painful upper eyelid. She was given doxycycline and told to use warm compresses. She has had no vision changes. She is cleaning eyelid with OTC wipes. No fever, chills, sinus pressure, headache. Her eye feels better and swelling improving but she has having other symptoms. Her mouth is dry, irritated, difficult to swallow, and has white patches. Her scalp also feels itchy. No body rash. She feels like allergy to doxycycline. No SoB, lip swelling.   She request a shampoo that was given once for her scalp itching. She can't remember the name. Coal tar shampoo is not working.   .. Active Ambulatory Problems    Diagnosis Date Noted  . Stricture of artery ([place]) 10/02/2011  . Atherosclerosis of other specified arteries 04/02/2012  . 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 Hypertension 12/06/2012  . Peripheral arterial disease ([place]) 12/06/2012  . CAD, cath 2009 12/06/2012  . Hyponatremia, improved holding diuretic 12/06/2012  . Hyperlipidemia 04/05/2013  . 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[place]) 01/16/2014  . Preoperative evaluation of a medical condition to rule out surgical contraindications (TAR required) 01/17/2014  . 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 Irregular heartbeat 07/26/2014  . History of colonoscopy 07/17/2015  . Menopause 12/08/2015  . Osteopenia 12/13/2015  . Acute bilateral low back pain with left-sided sciatica 06/06/2016  . Atypical nevus 08/22/2016  . Strain of right Achilles tendon 04/14/2017  . Primary osteoarthritis of first carpometacarpal joint of left hand 07/10/2017  . CKD (chronic kidney disease) stage 3, GFR 30-59 ml/min (HCC) 08/04/2017  . Osteoporosis 12/25/2017  . Atherosclerosis of aorta ([place])  01/04/2018   Resolved Ambulatory Problems    Diagnosis Date Noted  . Subclavian artery stenosis, left ([place]) 10/02/2011  . Pre-syncope 12/06/2012  . URI (upper respiratory infection) 06/22/2015  . Protein-calorie malnutrition ([place]) 08/29/2016   Past Medical History:  Diagnosis Date  . Anxiety   . Atrial fibrillation ([place])   . CAD (coronary artery disease)   . Claudication ([place]) 01/06/2008  . Coronary artery disease   . GERD (gastroesophageal reflux disease)   . Nonspecific ST-T wave electrocardiographic changes 03/26/2011  . PAD (peripheral artery disease) ([place])   . Peripheral vascular disease ([place])   . PVD (peripheral vascular disease) ([place]) 11/05/2011  . Sigmoid diverticulitis      Review of Systems See HPI.     Objective:   Physical Exam Vitals signs reviewed.  Constitutional:      Appearance: Normal appearance.  HENT:     Head: Normocephalic.     Mouth/Throat:     Mouth: Mucous membranes are dry.     Comments: White patches on tongue and the side of buccal mucosa.  Eyes:     Extraocular Movements: Extraocular movements intact.     Conjunctiva/sclera: Conjunctivae normal.     Pupils: Pupils are equal, round, and reactive to light.     Comments: Right upper eyelid swollen and erythematous. No active drainage.   Cardiovascular:     Rate and Rhythm: Normal rate and regular rhythm.  Pulmonary:     Effort: Pulmonary effort is normal.  Neurological:     Mental Status: She is alert.  Assessment & Plan:  Marland KitchenMarland KitchenAmeilia was seen today for medication problem.  Diagnoses and all orders for this visit:  Adverse effect of drug, initial encounter -     fluconazole (DIFLUCAN) 150 MG tablet; Take 1 tablet (150 mg total) by mouth once for 1 dose. Repeat dose in 48 hours. -     nystatin (MYCOSTATIN) 100000 UNIT/ML suspension; Take 5 mLs (500,000 Units total) by mouth 4 (four) times daily. Swish for 30 seconds and spit out.  Blepharitis of right upper eyelid,  unspecified type -     cefdinir (OMNICEF) 300 MG capsule; Take 1 capsule (300 mg total) by mouth 2 (two) times daily. For 7 day.  Thrush -     fluconazole (DIFLUCAN) 150 MG tablet; Take 1 tablet (150 mg total) by mouth once for 1 dose. Repeat dose in 48 hours. -     nystatin (MYCOSTATIN) 100000 UNIT/ML suspension; Take 5 mLs (500,000 Units total) by mouth 4 (four) times daily. Swish for 30 seconds and spit out.  Seborrheic dermatitis -     ketoconazole (NIZORAL) 2 % shampoo; Apply once daily and wash off for one week.   Appears like blepharitis.   It does appear that patient has thrush. Given HO.  Stop doxycycline. Pt tolerated ominicef start that. Given diflucan and nystatin mouthwash. Reassured right eyelid does appear to be improving. Continue with warm compresses and keeping eyelid clean.   Pt requested refill on shampoo for her scalp dermatitis.

## 2019-01-15 NOTE — Patient Instructions (Addendum)
Oral Thrush, Adult  Oral thrush is an infection in your mouth and throat. It causes white patches on your tongue and in your mouth. Follow these instructions at home: Helping with soreness   To lessen your pain: ? Drink cold liquids, like water and iced tea. ? Eat frozen ice pops or frozen juices. ? Eat foods that are easy to swallow, like gelatin and ice cream. ? Drink from a straw if the patches in your mouth are painful. General instructions  Take or use over-the-counter and prescription medicines only as told by your doctor. Medicine for oral thrush may be something to swallow, or it may be something to put on the infected area.  Eat plain yogurt that has live cultures in it. Read the label to make sure.  If you wear dentures: ? Take out your dentures before you go to bed. ? Brush them well. ? Soak them in a denture cleaner.  Rinse your mouth with warm salt-water many times a day. To make the salt-water mixture, completely dissolve 1/2-1 teaspoon of salt in 1 cup of warm water. Contact a doctor if:  Your problems are getting worse.  Your problems do not get better in less than 7 days with treatment.  Your infection is spreading. This may show as white patches on the skin outside of your mouth.  You are nursing your baby and you have redness and pain in the nipples. This information is not intended to replace advice given to you by your health care provider. Make sure you discuss any questions you have with your health care provider. Document Released: 08/28/2009 Document Revised: 09/05/2017 Document Reviewed: 02/26/2016 Elsevier Patient Education  [place].  Blepharitis Blepharitis is swelling of the eyelids. Symptoms may include: Reddish, scaly skin around the scalp and eyebrows. Burning or itching of the eyelids. Fluid coming from the eye at night. This causes the eyelashes to stick together in the morning. Eyelashes that fall out. Being sensitive to light.  Follow these instructions at home: Pay attention to any changes in how you look or feel. Tell your health care provider about any changes. Follow these instructions to help with your condition: Keeping clean  Wash your hands often. Wash your eyelids with warm water, or wash them with warm water that is mixed with little bit of baby shampoo. Do this 2 or more times per day. Wash your face and eyebrows at least once a day. Use a clean towel each time you dry your eyelids. Do not use the towel to clean or dry other areas of your body. Do not share your towel with anyone. General instructions Avoid wearing makeup until you get better. Do not share makeup with anyone. Avoid rubbing your eyes. Put a warm compress on your eyes 2 times per day for 10 minutes at a time, or as told by your doctor. If you were given antibiotics in the form of creams or eye drops, use the medicine as told by your doctor. Do not stop using the medicine even if you feel better. Keep all follow-up visits as told by your doctor. This is important. Contact a doctor if: Your eyelids feel hot. You have blisters on your eyelids. You have a rash on your eyelids. The swelling does not go away in 2-4 days. The swelling gets worse. Get help right away if: You have pain that gets worse. You have pain that spreads to other parts of your face. You have redness that gets worse. You have  redness that spreads to other parts of your face. Your vision changes. You have pain when you look at lights or things that move. You have a fever. Summary Blepharitis is swelling of the eyelids. Pay attention to any changes in how your eyes look or feel. Tell your doctor about any changes. Follow home care instructions as told by your doctor. Wash your hands often. Avoid wearing makeup. Do not rub your eyes. Use warm compresses, creams, or eye drops as told by your doctor. Let your doctor know if you have changes in vision, blisters or rash on  eyelids, pain that spreads to your face, or warmth on your eyelids. This information is not intended to replace advice given to you by your health care provider. Make sure you discuss any questions you have with your health care provider. Document Released: 03/12/2008 Document Revised: 12/01/2017 Document Reviewed: 12/01/2017 Elsevier Patient Education  2020 [employer].

## 2019-01-17 ENCOUNTER — Encounter: Payer: Self-pay | Admitting: Physician Assistant

## 2019-02-02 DIAGNOSIS — H35431 Paving stone degeneration of retina, right eye: Secondary | ICD-10-CM | POA: Diagnosis not present

## 2019-02-02 DIAGNOSIS — H35371 Puckering of macula, right eye: Secondary | ICD-10-CM | POA: Diagnosis not present

## 2019-02-02 DIAGNOSIS — H43391 Other vitreous opacities, right eye: Secondary | ICD-10-CM | POA: Diagnosis not present

## 2019-02-02 DIAGNOSIS — H43813 Vitreous degeneration, bilateral: Secondary | ICD-10-CM | POA: Diagnosis not present

## 2019-02-04 DIAGNOSIS — H0011 Chalazion right upper eyelid: Secondary | ICD-10-CM | POA: Diagnosis not present

## 2019-02-17 ENCOUNTER — Ambulatory Visit (INDEPENDENT_AMBULATORY_CARE_PROVIDER_SITE_OTHER): Payer: Medicare Other | Admitting: Cardiovascular Disease

## 2019-02-17 ENCOUNTER — Encounter: Payer: Self-pay | Admitting: Cardiovascular Disease

## 2019-02-17 ENCOUNTER — Other Ambulatory Visit: Payer: Self-pay

## 2019-02-17 VITALS — BP 131/74 | HR 65 | Temp 97.1°F | Ht 64.0 in | Wt 152.5 lb

## 2019-02-17 DIAGNOSIS — E7800 Pure hypercholesterolemia, unspecified: Secondary | ICD-10-CM | POA: Diagnosis not present

## 2019-02-17 DIAGNOSIS — I700 Atherosclerosis of aorta: Secondary | ICD-10-CM

## 2019-02-17 DIAGNOSIS — I2510 Atherosclerotic heart disease of native coronary artery without angina pectoris: Secondary | ICD-10-CM | POA: Diagnosis not present

## 2019-02-17 DIAGNOSIS — I739 Peripheral vascular disease, unspecified: Secondary | ICD-10-CM | POA: Diagnosis not present

## 2019-02-17 DIAGNOSIS — I6523 Occlusion and stenosis of bilateral carotid arteries: Secondary | ICD-10-CM | POA: Diagnosis not present

## 2019-02-17 DIAGNOSIS — I771 Stricture of artery: Secondary | ICD-10-CM

## 2019-02-17 DIAGNOSIS — K551 Chronic vascular disorders of intestine: Secondary | ICD-10-CM

## 2019-02-17 NOTE — Patient Instructions (Signed)
Medication Instructions:  Your physician recommends that you continue on your current medications as directed. Please refer to the Current Medication list given to you today.  If you need a refill on your cardiac medications before your next appointment, please call your pharmacy.   Lab work: Lipids and CMET. If you have labs (blood work) drawn today and your tests are completely normal, you will receive your results only by: [place] (if you have MyChart) OR A paper copy in the mail If you have any lab test that is abnormal or we need to change your treatment, we will call you to review the results.  Testing/Procedures: NONE  Follow-Up: At [facility], you and your health needs are our priority.  As part of our continuing mission to provide you with exceptional heart care, we have created designated Provider Care Teams.  These Care Teams include your primary Cardiologist (physician) and Advanced Practice Providers (APPs -  Physician Assistants and Nurse Practitioners) who all work together to provide you with the care you need, when you need it. You will need a follow up appointment in 12 months.  Please call our office 2 months in advance to schedule this appointment.  You may see Sanda Klein, MD. or one of the following Advanced Practice Providers on your designated Care Team: Almyra Deforest, PA-C Fabian Sharp, [place]

## 2019-02-17 NOTE — Progress Notes (Signed)
Patient ID: Traci [name], female   DOB: [birth year], 73 y.o.   MRN: [id number]    Cardiology Office Note    Date:  02/17/2019   ID:  LANDY KANGAS, DOB [birth year], MRN [id number]  PCP:  Gregor Hams, MD  Cardiologist:   Sanda Klein, MD   Chief Complaint  Patient presents with   Coronary Artery Disease   PAD    History of Present Illness:  HRISTINA [name] is a 73 y.o. female with an extensive history of peripheral arterial disease involving the mesenteric circulation and upper extremities, mild nonobstructive coronary atherosclerosis, hyperlipidemia, hypertension.  She has occasional chest twinges at rest that never occurred in physical activity.  She continues to try to exercise by walking twice daily, but unfortunately has been unable to go to the gym or dance, which is her favorite form of physical exercise.  She has not gained any weight.  She develops abdominal discomfort if she eats more than "700 cal a day".  She has a history of mesenteric stenoses.  She drinks protein shakes to avoid losing weight.  She denies exertional dyspnea, palpitations, dizziness, syncope, intermittent claudication or focal neurological events.  She has not had falls, bleeding problems or serious injuries.  She sees Dr. Scot Dock in the vascular clinic.  Traci [name] has long-standing history of peripheral arterial disease with previous stents and surgical revascularization of the left subclavian artery for subclavian steal syndrome and presyncope. She eventually underwent a left carotid to subclavian bypass.  She underwent a bypass from the aorta to the chronically totally occluded superficial mesenteric artery due to symptoms of chronic mesenteric ischemia, (Dr. Scot Dock August 2015 6 mm Dacron graft) She had moderate CAD by coronary angiography performed in 2009. Normal left ventricular systolic function and no perfusion abnormalities by echo 2015 and nuclear  stress test performed in 2014 and  October 2017. She had carotid duplex ultrasonography last performed in March 2019, without evidence of significant obstruction. She has a left carotid-subclavian bypass graft without evidence of stenosis. The aortic SMA bypass was noted on abdominal CT performed in July 2018, although no comment was made about its patency. There was no evidence of aortic aneurysm and no comment made about the renal arteries.   Past Medical History:  Diagnosis Date   Anxiety    Atrial fibrillation (HCC)    CAD (coronary artery disease)    Claudication ([place]) 01/06/2008   Lower extremity dopplers - no evidence of arterial insufficiency, normal exam   Coronary artery disease    GERD (gastroesophageal reflux disease)    Hyperlipidemia    Hypertension 11/30/2010   echo- EF 55%; normal w/ mildly sclerotic aortic valve   Hypertension 11/05/11   renal dopplers - celiac artery and SMA >50% diameter reduction, R renal artery - mildly elevated velocities 1-59% diameter reduction, L renal artery normal   Nonspecific ST-T wave electrocardiographic changes 03/26/2011   R/Lmv - EF 74%, normal perfusion all regions, ST depression w/ Lexiscan infusion w/o assoc angina   Osteopenia    PAD (peripheral artery disease) (HCC)    Peripheral vascular disease (HCC)    PVD (peripheral vascular disease) ([place]) 11/05/2011   doppler - R/L brachial pressures essentially equal w/o inflow disease; L sublclavian/CCA bypass graft demonstrates patent flow, no evidence of significant stenosis   Sigmoid diverticulitis     Past Surgical History:  Procedure Laterality Date   ABDOMINAL AORTIC ANEURYSM REPAIR N/A 01/27/2014   Procedure: AORTO-SUPERIOR MESENTERIC ARTERY BYPASS GRAFT;  Surgeon: Angelia Mould, MD;  Location: [place];  Service: Vascular;  Laterality: N/A;   ABDOMINAL HYSTERECTOMY     APPENDECTOMY     CARDIAC CATHETERIZATION  06/03/2008   60% LAD, involving D2, borderline  significant by IVUS, medical therapy. CFX, RCA OK.   CATARACT EXTRACTION     CHOLECYSTECTOMY     EYE SURGERY     retinal surgery   SPINE SURGERY  Feb. 2011   [place]   X's  several   SUBCLAVIAN ARTERY STENT Left 09/20/2008   LSA ISR 7x41mm Cordis Genesis on Opta premount, reduction from 80% to 0%   subclavian artery stents  01/23/2010   Left carotid to subclavian artery Bypass   VISCERAL ANGIOGRAM  01/17/2014   Procedure: VISCERAL ANGIOGRAM;  Surgeon: Angelia Mould, MD;  Location[facility] C[facility] LAB;  Service: Cardiovascular;;    Outpatient Medications Prior to Visit  Medication Sig Dispense Refill   aspirin EC 81 MG tablet Take 81 mg by mouth daily.     atenolol (TENORMIN) 25 MG tablet Take 1 tablet (25 mg total) by mouth daily. 90 tablet 3   atorvastatin (LIPITOR) 10 MG tablet TAKE 1 TABLET DAILY AT 6 P.M. 90 tablet 4   candesartan (ATACAND) 16 MG tablet Take 0.5 tablets (8 mg total) by mouth daily. 45 tablet 3   cholecalciferol (VITAMIN D) 1000 UNITS tablet Take 1,000 Units by mouth daily.     clopidogrel (PLAVIX) 75 MG tablet TAKE 1 TABLET DAILY 90 tablet 4   clorazepate (TRANXENE) 7.5 MG tablet Take 1 tablet (7.5 mg total) by mouth daily as needed. 7 tablet 0   cyanocobalamin 1000 MCG tablet Take 100 mcg by mouth daily.     EPINEPHrine (EPIPEN 2-PAK) 0.3 mg/0.3 mL IJ SOAJ injection Inject 0.3 mLs (0.3 mg total) into the muscle as needed (for allergic reaction). 2 Device 0   esomeprazole (NEXIUM) 40 MG capsule Take 1 capsule (40 mg total) by mouth daily. Take 1 tab daily (Patient taking differently: Take 40 mg by mouth daily. ) 90 capsule 0   estradiol (ESTRACE) 0.5 MG tablet TAKE 1 TABLET DAILY 90 tablet 3   furosemide (LASIX) 20 MG tablet TAKE 1 TABLET EVERY OTHER DAY (Patient taking differently: Take 20 mg by mouth 3 (three) times a week. ) 45 tablet 6   ketoconazole (NIZORAL) 2 % shampoo Apply once daily and wash off for one week. 120 mL 0     linaclotide (LINZESS) 145 MCG CAPS capsule Take 1 capsule by mouth daily as needed.     meclizine (ANTIVERT) 25 MG tablet TAKE ONE-HALF (1/2) TABLET THREE TIMES A DAY AS NEEDED FOR DIZZINESS OR NAUSEA 90 tablet 6   Multiple Vitamin (MULTIVITAMIN WITH MINERALS) TABS Take 1 tablet by mouth daily.     zolpidem (AMBIEN) 10 MG tablet Take 1 tablet (10 mg total) by mouth at bedtime as needed for sleep. 90 tablet 1   cefdinir (OMNICEF) 300 MG capsule Take 1 capsule (300 mg total) by mouth 2 (two) times daily. For 7 day. (Patient not taking: Reported on 02/17/2019) 14 capsule 0   nystatin (MYCOSTATIN) 100000 UNIT/ML suspension Take 5 mLs (500,000 Units total) by mouth 4 (four) times daily. Swish for 30 seconds and spit out. (Patient not taking: Reported on 02/17/2019) 180 mL 0   No facility-administered medications prior to visit.      Allergies:   Cortisone, Dilaudid [hydromorphone hcl], Iodine, Medrol [methylprednisolone], Omnipaque [iohexol], Prednisone, Shellfish allergy,  Sulfa drugs cross reactors, Doxycycline, Methylprednisolone sodium succ, Sulfa antibiotics, Augmentin [amoxicillin-pot clavulanate], Codeine, Erythromycin, and Morphine and related   Social History   Socioeconomic History   Marital status: Widowed    Spouse name: Not on file   Number of children: 1   Years of education: 16   Highest education level: Bachelor's degree (e.g., BA, AB, BS)  Occupational History   Occupation: retired    Comment: Editor, commissioning strain: Not hard at all   Food insecurity    Worry: Never true    Inability: Never true   Transportation needs    Medical: No    Non-medical: No  Tobacco Use   Smoking status: Former Smoker    Packs/day: 0.25    Years: 20.00    Pack years: 5.00    Types: Cigarettes    Quit date: 09/30/1993    Years since quitting: 25.4   Smokeless tobacco: Never Used  Substance and Sexual Activity   Alcohol use: No   Drug use: No    Sexual activity: Never  Lifestyle   Physical activity    Days per week: 3 days    Minutes per session: 30 min   Stress: Not at all  Relationships   Social connections    Talks on phone: More than three times a week    Gets together: Twice a week    Attends religious service: More than 4 times per year    Active member of club or organization: Yes    Attends meetings of clubs or organizations: More than 4 times per year    Relationship status: Widowed  Other Topics Concern   Not on file  Social History Narrative   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 No caffeine use.     Family History:  The patient's family history includes Diabetes in her maternal grandmother; Heart attack in her father; Heart disease in her paternal grandfather; Heart disease (age of onset: 35) in her father; Hyperlipidemia in her father; Hypertension in her father; Kidney disease in her father; Kidney failure in her mother.   ROS:   Please see the history of present illness.    ROS All other systems are reviewed and are negative  PHYSICAL EXAM:   VS:  BP 131/74    Pulse 65    Temp (!) 97.1 F (36.2 C)    Ht 5\' 4"  (1.626 m)    Wt 152 lb 8 oz (69.2 kg)    SpO2 98%    BMI 26.18 kg/m    Blood pressure left arm is roughly 20 mmHg lower    General: Alert, oriented x3, no distress, appears well Head: no evidence of trauma, PERRL, EOMI, no exophtalmos or lid lag, no myxedema, no xanthelasma; normal ears, nose and oropharynx Neck: normal jugular venous pulsations and no hepatojugular reflux; brisk carotid pulses without delay; has bilateral carotid bruits Chest: clear to auscultation, no signs of consolidation by percussion or palpation, normal fremitus, symmetrical and full respiratory excursions Cardiovascular: normal position and quality of the apical impulse, regular rhythm, normal first and second heart sounds, no murmurs, rubs or gallops.  Bilateral subclavian bruits are present Abdomen: no  tenderness or distention, no masses by palpation, no abnormal pulsatility, does have periumbilical bruits, systolic only), normal bowel sounds, no hepatosplenomegaly Extremities: no clubbing, cyanosis or edema; 2+ radial, ulnar and brachial pulses bilaterally; 2+ right femoral, posterior tibial and dorsalis pedis pulses; 2+ left femoral,  posterior tibial and dorsalis pedis pulses; no subclavian or femoral bruits Neurological: grossly nonfocal Psych: Normal mood and affect   Wt Readings from Last 3 Encounters:  02/17/19 152 lb 8 oz (69.2 kg)  01/15/19 153 lb (69.4 kg)  01/05/19 153 lb (69.4 kg)    Studies/Labs Reviewed:   EKG:  EKG is ordered today.  ECG from 04/10/2019 shows muscles were recorded, otherwise normal tracing   Duplex carotid ultrasound December 16, 2018: Summary: Right Carotid: Velocities in the right ICA are consistent with a 1-39% stenosis.  Left Carotid: Velocities in the left ICA are consistent with a 1-39% stenosis.               Widely patent left common carotid to subclavian artery bypass               graft.  Vertebrals:  Bilateral vertebral arteries demonstrate antegrade flow. Subclavians: Normal flow hemodynamics were seen in bilateral subclavian              arteries.   Lexiscan Myoview 01/13/2019:  The left ventricular ejection fraction is hyperdynamic (>65%).  Nuclear stress EF: 71%.  There was no ST segment deviation noted during stress.  T wave inversion was noted during stress in the II, III, aVF, V3, V4, V5 and V6 leads, beginning at 30 seconds of stress, and returning to baseline after less than 1 min of recovery.  This is a low risk study.   No ischemia or infarction noted.  There is significant tracer uptake in the bowel which may impact the interpretation of the inferior wall which appears to have normal perfusion. Wall motion is normal, therefore perfusion is likely normal.   Consider performing future studies on the D-Spect (supine and  upright) camera.    Recent Labs: 07/03/2018: ALT 16; Brain Natriuretic Peptide 105; BUN 14; Creat 0.88; Hemoglobin 13.0; Platelets 321; Potassium 4.6; Sodium 137; TSH 2.14   Lipid Panel    Component Value Date/Time   CHOL 126 02/07/2016 0822   CHOL 141 04/07/2013 0857   TRIG 130 02/07/2016 0822   HDL 46 02/07/2016 0822   HDL 47 04/07/2013 0857   CHOLHDL 2.7 02/07/2016 0822   VLDL 26 02/07/2016 0822   LDLCALC 54 02/07/2016 0822   LDLCALC 67 04/07/2013 0857    ASSESSMENT:    1. Coronary artery disease, non-occlusive, last cath 2009   2. Superior mesenteric artery stenosis (HCC)   3. Pure hypercholesterolemia   4. PAD (peripheral artery disease) ([place])   5. Atherosclerosis of aorta (HCC)    PLAN:  In order of problems listed above:  1. CAD:  known moderate stenosis in the LAD artery by previous coronary angiography.  No evidence of ischemia by myocardial perfusion study January 05, 2019. 2. 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 Status post aorto mesenteric bypass. 3. L SCL artery occlusion: Does not have upper extremity claudication and had a patent subclavian bypass by ultrasound.  Check BP in right arm. 4. HLP: On lipid-lowering therapy, time to recheck today. 5. Ao atherosclerosis: on radiological studies. Extensive PAD.  No symptoms of intermittent claudication.  Dr. Doren Custard plan abdominal aorta next year.  Last had CT of the abdomen and pelvis in November 2019, but this was performed without IV contrast   Medication Adjustments/Labs and Tests Ordered: Current medicines are reviewed at length with the patient today.  Concerns regarding medicines are outlined above.  Medication changes, Labs and Tests ordered today are listed in the Patient Instructions  below. Patient Instructions  Medication Instructions:  Your physician recommends that you continue on your current medications as directed. Please refer to the Current Medication list given to you today.  If you  need a refill on your cardiac medications before your next appointment, please call your pharmacy.   Lab work: Lipids and CMET. If you have labs (blood work) drawn today and your tests are completely normal, you will receive your results only by: [place] (if you have MyChart) OR A paper copy in the mail If you have any lab test that is abnormal or we need to change your treatment, we will call you to review the results.  Testing/Procedures: NONE  Follow-Up: At [facility], you and your health needs are our priority.  As part of our continuing mission to provide you with exceptional heart care, we have created designated Provider Care Teams.  These Care Teams include your primary Cardiologist (physician) and Advanced Practice Providers (APPs -  Physician Assistants and Nurse Practitioners) who all work together to provide you with the care you need, when you need it. You will need a follow up appointment in 12 months.  Please call our office 2 months in advance to schedule this appointment.  You may see Sanda Klein, MD. or one of the following Advanced Practice Providers on your designated Care Team: Almyra Deforest, PA-C Fabian Sharp, [place]      Signed, Sanda Klein, MD  02/17/2019 1:08 PM    [address] [place], [place],   [address] Phone: [phone]; Fax: [phone]

## 2019-02-18 ENCOUNTER — Encounter: Payer: Self-pay | Admitting: Family Medicine

## 2019-02-18 LAB — COMPREHENSIVE METABOLIC PANEL
ALT: 16 IU/L (ref 0–32)
AST: 27 IU/L (ref 0–40)
Albumin/Globulin Ratio: 1.5 (ref 1.2–2.2)
Albumin: 4.3 g/dL (ref 3.7–4.7)
Alkaline Phosphatase: 58 IU/L (ref 39–117)
BUN/Creatinine Ratio: 13 (ref 12–28)
BUN: 15 mg/dL (ref 8–27)
Bilirubin Total: 0.4 mg/dL (ref 0.0–1.2)
CO2: 23 mmol/L (ref 20–29)
Calcium: 9 mg/dL (ref 8.7–10.3)
Chloride: 97 mmol/L (ref 96–106)
Creatinine, Ser: 1.16 mg/dL — ABNORMAL HIGH (ref 0.57–1.00)
GFR calc Af Amer: 54 mL/min/{1.73_m2} — ABNORMAL LOW (ref >59)
GFR calc non Af Amer: 47 mL/min/{1.73_m2} — ABNORMAL LOW (ref >59)
Globulin, Total: 2.9 g/dL (ref 1.5–4.5)
Glucose: 91 mg/dL (ref 65–99)
Potassium: 4.9 mmol/L (ref 3.5–5.2)
Sodium: 136 mmol/L (ref 134–144)
Total Protein: 7.2 g/dL (ref 6.0–8.5)

## 2019-02-18 LAB — LIPID PANEL
Chol/HDL Ratio: 2.7 ratio (ref 0.0–4.4)
Cholesterol, Total: 129 mg/dL (ref 100–199)
HDL: 47 mg/dL (ref >39)
LDL Chol Calc (NIH): 53 mg/dL (ref 0–99)
Triglycerides: 178 mg/dL — ABNORMAL HIGH (ref 0–149)
VLDL Cholesterol Cal: 29 mg/dL (ref 5–40)

## 2019-03-02 ENCOUNTER — Other Ambulatory Visit: Payer: Self-pay | Admitting: Cardiovascular Disease

## 2019-03-09 ENCOUNTER — Encounter: Payer: Self-pay | Admitting: Family Medicine

## 2019-03-29 ENCOUNTER — Other Ambulatory Visit: Payer: Self-pay

## 2019-03-29 ENCOUNTER — Ambulatory Visit (INDEPENDENT_AMBULATORY_CARE_PROVIDER_SITE_OTHER): Payer: Medicare Other | Admitting: Family Medicine

## 2019-03-29 ENCOUNTER — Ambulatory Visit (INDEPENDENT_AMBULATORY_CARE_PROVIDER_SITE_OTHER): Payer: Medicare Other

## 2019-03-29 ENCOUNTER — Encounter: Payer: Self-pay | Admitting: Family Medicine

## 2019-03-29 VITALS — BP 145/77 | HR 63 | Temp 97.8°F | Wt 153.0 lb

## 2019-03-29 DIAGNOSIS — R1084 Generalized abdominal pain: Secondary | ICD-10-CM

## 2019-03-29 DIAGNOSIS — K551 Chronic vascular disorders of intestine: Secondary | ICD-10-CM

## 2019-03-29 DIAGNOSIS — I6523 Occlusion and stenosis of bilateral carotid arteries: Secondary | ICD-10-CM | POA: Diagnosis not present

## 2019-03-29 DIAGNOSIS — R109 Unspecified abdominal pain: Secondary | ICD-10-CM | POA: Diagnosis not present

## 2019-03-29 MED ORDER — ESTRADIOL 0.5 MG PO TABS: 0.5000 mg | ORAL_TABLET | Freq: Every day | ORAL | 3 refills | Status: DC

## 2019-03-29 MED ORDER — MECLIZINE HCL 25 MG PO TABS: ORAL_TABLET | ORAL | 6 refills | Status: DC

## 2019-03-29 MED ORDER — DICYCLOMINE HCL 10 MG PO CAPS: 10.0000 mg | ORAL_CAPSULE | Freq: Three times a day (TID) | ORAL | 0 refills | Status: DC

## 2019-03-29 NOTE — Progress Notes (Signed)
Traci [name] is a 73 y.o. female who presents to [facility] today for abdominal pain.  Traci [name] has a history of chronic mesenteric ischemia status post mesenteric artery bypass.  She has a history of occasional exacerbations of abdominal pain thought to be related to her chronic ischemia.  She notes that she started having some abdominal pain on Wednesday, October 7.  She reduced her diet to a bland diet with less frequent episodes of eating.  She notes this has helped some.  She denies any vomiting or diarrhea.  She notes some mild loose stools.  She notes her pain worsens after eating but is able to eat and drink.  She rates her pain is moderate.  She denies severe pain fevers chills.  She feels well otherwise.   ROS as above:  Exam:  BP (!) 145/77   Pulse 63   Temp 97.8 F (36.6 C) (Oral)   Wt 153 lb (69.4 kg)   BMI 26.26 kg/m  Wt Readings from Last 5 Encounters:  03/29/19 153 lb (69.4 kg)  02/17/19 152 lb 8 oz (69.2 kg)  01/15/19 153 lb (69.4 kg)  01/05/19 153 lb (69.4 kg)  12/16/18 153 lb (69.4 kg)    Gen: Well NAD HEENT: EOMI,  MMM Lungs: Normal work of breathing. CTABL Heart: RRR no MRG Abd: NABS, Soft. Nondistended, mildly tender mid abdomen with no rebound or guarding. Exts: Brisk capillary refill, warm and well perfused.   Lab and Radiology Results X-ray images abdomen obtained today personally and independently reviewed. No air-fluid levels or dilated loops of small bowel.  No evidence of small bowel obstruction.  Moderate stool burden. Await formal radiology review   Assessment and Plan: 73 y.o. female with exacerbation of chronic intermittent abdominal pain.  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  Plan for treatment with watchful waiting and relative abdominal rest and advancing diet as tolerated.  We will  try using dicyclomine.  Chronic medications refilled.  If not improving would proceed with lab evaluation.  PDMP not reviewed this encounter. Orders Placed This Encounter  Procedures  . DG Abd 2 Views    Standing Status:   Future    Number of Occurrences:   1    Standing Expiration Date:   05/28/2020    Order Specific Question:   Reason for Exam (SYMPTOM  OR DIAGNOSIS REQUIRED)    Answer:   abd pain. hx ischemia. R/u SBO or significant constipation    Order Specific Question:   Preferred imaging location?    Answer:   [facility]    Order Specific Question:   Radiology Contrast Protocol - do NOT remove file path    Answer:   [email or web address]   Meds ordered this encounter  Medications  . dicyclomine (BENTYL) 10 MG capsule    Sig: Take 1 capsule (10 mg total) by mouth 4 (four) times daily -  before meals and at bedtime.    Dispense:  90 capsule    Refill:  0  . meclizine (ANTIVERT) 25 MG tablet    Sig: TAKE ONE-HALF (1/2) TABLET THREE TIMES A DAY AS NEEDED FOR DIZZINESS OR NAUSEA    Dispense:  90 tablet    Refill:  6  . estradiol (ESTRACE) 0.5 MG tablet    Sig: Take 1 tablet (0.5 mg total) by mouth daily.    Dispense:  90 tablet    Refill:  3  Historical information moved to improve visibility of documentation.  Past Medical History:  Diagnosis Date  . Anxiety   . Atrial fibrillation ([place])   . CAD (coronary artery disease)   . Claudication ([place]) 01/06/2008   Lower extremity dopplers - no evidence of arterial insufficiency, normal exam  . Coronary artery disease   . GERD (gastroesophageal reflux disease)   . Hyperlipidemia   . Hypertension 11/30/2010   echo- EF 55%; normal w/ mildly sclerotic aortic valve  . Hypertension 11/05/11   renal dopplers - celiac artery and SMA >50% diameter reduction, R renal artery - mildly elevated velocities 1-59% diameter reduction, L renal artery normal  . Nonspecific ST-T wave  electrocardiographic changes 03/26/2011   R/Lmv - EF 74%, normal perfusion all regions, ST depression w/ Lexiscan infusion w/o assoc angina  . Osteopenia   . PAD (peripheral artery disease) ([place])   . Peripheral vascular disease ([place])   . PVD (peripheral vascular disease) ([place]) 11/05/2011   doppler - R/L brachial pressures essentially equal w/o inflow disease; L sublclavian/CCA bypass graft demonstrates patent flow, no evidence of significant stenosis  . Sigmoid diverticulitis    Past Surgical History:  Procedure Laterality Date  . ABDOMINAL AORTIC ANEURYSM REPAIR N/A 01/27/2014   Procedure: AORTO-SUPERIOR MESENTERIC ARTERY BYPASS GRAFT;  Surgeon: Angelia Mould, MD;  Location: [place];  Service: Vascular;  Laterality: N/A;  . ABDOMINAL HYSTERECTOMY    . APPENDECTOMY    . CARDIAC CATHETERIZATION  06/03/2008   60% LAD, involving D2, borderline significant by IVUS, medical therapy. CFX, RCA OK.  Marland Kitchen CATARACT EXTRACTION    . CHOLECYSTECTOMY    . EYE SURGERY     retinal surgery  . SPINE SURGERY  Feb. 2011  . [place]   X's  several  . SUBCLAVIAN ARTERY STENT Left 09/20/2008   LSA ISR 7x25mm Cordis Genesis on Opta premount, reduction from 80% to 0%  . subclavian artery stents  01/23/2010   Left carotid to subclavian artery Bypass  . VISCERAL ANGIOGRAM  01/17/2014   Procedure: VISCERAL ANGIOGRAM;  Surgeon: Angelia Mould, MD;  Location[facility] C[facility] LAB;  Service: Cardiovascular;;   Social History   Tobacco Use  . Smoking status: Former Smoker    Packs/day: 0.25    Years: 20.00    Pack years: 5.00    Types: Cigarettes    Quit date: 09/30/1993    Years since quitting: 25.5  . Smokeless tobacco: Never Used  Substance Use Topics  . Alcohol use: No   family history includes Diabetes in her maternal grandmother; Heart attack in her father; Heart disease in her paternal grandfather; Heart disease (age of onset: 76) in her father; Hyperlipidemia in her father;  Hypertension in her father; Kidney disease in her father; Kidney failure in her mother.  Medications: Current Outpatient Medications  Medication Sig Dispense Refill  . aspirin EC 81 MG tablet Take 81 mg by mouth daily.    Marland Kitchen atenolol (TENORMIN) 25 MG tablet TAKE 1 TABLET DAILY 90 tablet 3  . atorvastatin (LIPITOR) 10 MG tablet TAKE 1 TABLET DAILY AT 6 P.M. 90 tablet 4  . candesartan (ATACAND) 16 MG tablet Take 0.5 tablets (8 mg total) by mouth daily. 45 tablet 3  . cholecalciferol (VITAMIN D) 1000 UNITS tablet Take 1,000 Units by mouth daily.    . clopidogrel (PLAVIX) 75 MG tablet TAKE 1 TABLET DAILY 90 tablet 4  . clorazepate (TRANXENE) 7.5 MG tablet Take 1 tablet (7.5 mg total)  by mouth daily as needed. 7 tablet 0  . cyanocobalamin 1000 MCG tablet Take 100 mcg by mouth daily.    Marland Kitchen dicyclomine (BENTYL) 10 MG capsule Take 1 capsule (10 mg total) by mouth 4 (four) times daily -  before meals and at bedtime. 90 capsule 0  . EPINEPHrine (EPIPEN 2-PAK) 0.3 mg/0.3 mL IJ SOAJ injection Inject 0.3 mLs (0.3 mg total) into the muscle as needed (for allergic reaction). 2 Device 0  . esomeprazole (NEXIUM) 40 MG capsule Take 1 capsule (40 mg total) by mouth daily. Take 1 tab daily (Patient taking differently: Take 40 mg by mouth daily. ) 90 capsule 0  . estradiol (ESTRACE) 0.5 MG tablet Take 1 tablet (0.5 mg total) by mouth daily. 90 tablet 3  . furosemide (LASIX) 20 MG tablet TAKE 1 TABLET EVERY OTHER DAY (Patient taking differently: Take 20 mg by mouth 3 (three) times a week. ) 45 tablet 6  . ketoconazole (NIZORAL) 2 % shampoo Apply once daily and wash off for one week. 120 mL 0  . linaclotide (LINZESS) 145 MCG CAPS capsule Take 1 capsule by mouth daily as needed.    . meclizine (ANTIVERT) 25 MG tablet TAKE ONE-HALF (1/2) TABLET THREE TIMES A DAY AS NEEDED FOR DIZZINESS OR NAUSEA 90 tablet 6  . Multiple Vitamin (MULTIVITAMIN WITH MINERALS) TABS Take 1 tablet by mouth daily.    Marland Kitchen nystatin (MYCOSTATIN)  100000 UNIT/ML suspension Take 5 mLs (500,000 Units total) by mouth 4 (four) times daily. Swish for 30 seconds and spit out. (Patient not taking: Reported on 02/17/2019) 180 mL 0  . zolpidem (AMBIEN) 10 MG tablet Take 1 tablet (10 mg total) by mouth at bedtime as needed for sleep. 90 tablet 1   No current facility-administered medications for this visit.    Allergies  Allergen Reactions  . Cortisone Anaphylaxis  . Dilaudid [Hydromorphone Hcl] Nausea And Vomiting    Pt states she will start vomiting immediately for 6 hours  . Iodine Anaphylaxis  . Medrol [Methylprednisolone] Anaphylaxis  . Omnipaque [Iohexol] Anaphylaxis  . Prednisone Anaphylaxis and Swelling  . Shellfish Allergy Anaphylaxis  . Sulfa Drugs Cross Reactors Anaphylaxis  . Doxycycline     Thrush/itching  . Methylprednisolone Sodium Succ Swelling    Blood pressure drops immediately  . Sulfa Antibiotics Other (See Comments)    Stomach upset  . Augmentin [Amoxicillin-Pot Clavulanate] Nausea Only    Upset stomach (Has taken cephalexin in the past without problems)  . Codeine Rash and Other (See Comments)    Upset stomach  . Erythromycin Rash  . Morphine And Related Nausea Only    nausea     Discussed warning signs or symptoms. Please see discharge instructions. Patient expresses understanding.

## 2019-03-29 NOTE — Patient Instructions (Addendum)
Thank you for coming in today. Get xray now.  Try dicyclomine.  Recheck if not better.

## 2019-04-05 ENCOUNTER — Encounter: Payer: Self-pay | Admitting: Family Medicine

## 2019-04-05 DIAGNOSIS — K551 Chronic vascular disorders of intestine: Secondary | ICD-10-CM

## 2019-04-05 DIAGNOSIS — R109 Unspecified abdominal pain: Secondary | ICD-10-CM

## 2019-04-05 DIAGNOSIS — R1084 Generalized abdominal pain: Secondary | ICD-10-CM

## 2019-04-06 DIAGNOSIS — R1084 Generalized abdominal pain: Secondary | ICD-10-CM | POA: Diagnosis not present

## 2019-04-06 DIAGNOSIS — K551 Chronic vascular disorders of intestine: Secondary | ICD-10-CM | POA: Diagnosis not present

## 2019-04-06 DIAGNOSIS — R109 Unspecified abdominal pain: Secondary | ICD-10-CM | POA: Diagnosis not present

## 2019-04-06 MED ORDER — CLORAZEPATE DIPOTASSIUM 7.5 MG PO TABS: 7.5000 mg | ORAL_TABLET | Freq: Every day | ORAL | 1 refills | Status: DC | PRN

## 2019-04-06 MED ORDER — ZOLPIDEM TARTRATE 10 MG PO TABS: 10.0000 mg | ORAL_TABLET | Freq: Every evening | ORAL | 1 refills | Status: DC | PRN

## 2019-04-07 MED ORDER — CEFDINIR 300 MG PO CAPS: 300.0000 mg | ORAL_CAPSULE | Freq: Two times a day (BID) | ORAL | 0 refills | Status: DC

## 2019-04-07 NOTE — Addendum Note (Signed)
Addended by: Gregor Hams on: 04/07/2019 06:50 AM   Modules accepted: Orders

## 2019-04-08 ENCOUNTER — Encounter: Payer: Self-pay | Admitting: Family Medicine

## 2019-04-08 DIAGNOSIS — N183 Chronic kidney disease, stage 3 unspecified: Secondary | ICD-10-CM

## 2019-04-08 LAB — COMPLETE METABOLIC PANEL WITH GFR
AG Ratio: 1.4 (calc) (ref 1.0–2.5)
ALT: 14 U/L (ref 6–29)
AST: 17 U/L (ref 10–35)
Albumin: 4 g/dL (ref 3.6–5.1)
Alkaline phosphatase (APISO): 52 U/L (ref 37–153)
BUN/Creatinine Ratio: 12 (calc) (ref 6–22)
BUN: 15 mg/dL (ref 7–25)
CO2: 25 mmol/L (ref 20–32)
Calcium: 8.5 mg/dL — ABNORMAL LOW (ref 8.6–10.4)
Chloride: 99 mmol/L (ref 98–110)
Creat: 1.22 mg/dL — ABNORMAL HIGH (ref 0.60–0.93)
GFR, Est African American: 51 mL/min/{1.73_m2} — ABNORMAL LOW (ref >=60)
GFR, Est Non African American: 44 mL/min/{1.73_m2} — ABNORMAL LOW (ref >=60)
Globulin: 2.9 g/dL (calc) (ref 1.9–3.7)
Glucose, Bld: 103 mg/dL — ABNORMAL HIGH (ref 65–99)
Potassium: 4.4 mmol/L (ref 3.5–5.3)
Sodium: 133 mmol/L — ABNORMAL LOW (ref 135–146)
Total Bilirubin: 0.4 mg/dL (ref 0.2–1.2)
Total Protein: 6.9 g/dL (ref 6.1–8.1)

## 2019-04-08 LAB — CBC WITH DIFFERENTIAL/PLATELET
Absolute Monocytes: 454 cells/uL (ref 200–950)
Basophils Absolute: 51 cells/uL (ref 0–200)
Basophils Relative: 1 %
Eosinophils Absolute: 158 cells/uL (ref 15–500)
Eosinophils Relative: 3.1 %
HCT: 36.8 % (ref 35.0–45.0)
Hemoglobin: 12.4 g/dL (ref 11.7–15.5)
Lymphs Abs: 1658 cells/uL (ref 850–3900)
MCH: 30 pg (ref 27.0–33.0)
MCHC: 33.7 g/dL (ref 32.0–36.0)
MCV: 89.1 fL (ref 80.0–100.0)
MPV: 9.8 fL (ref 7.5–12.5)
Monocytes Relative: 8.9 %
Neutro Abs: 2780 cells/uL (ref 1500–7800)
Neutrophils Relative %: 54.5 %
Platelets: 321 10*3/uL (ref 140–400)
RBC: 4.13 10*6/uL (ref 3.80–5.10)
RDW: 11.9 % (ref 11.0–15.0)
Total Lymphocyte: 32.5 %
WBC: 5.1 10*3/uL (ref 3.8–10.8)

## 2019-04-08 LAB — URINALYSIS W MICROSCOPIC + REFLEX CULTURE
Bacteria, UA: NONE SEEN /HPF
Bilirubin Urine: NEGATIVE
Glucose, UA: NEGATIVE
Hgb urine dipstick: NEGATIVE
Hyaline Cast: NONE SEEN /LPF
Ketones, ur: NEGATIVE
Nitrites, Initial: NEGATIVE
Protein, ur: NEGATIVE
RBC / HPF: NONE SEEN /HPF (ref 0–2)
Specific Gravity, Urine: 1.018 (ref 1.001–1.03)
Squamous Epithelial / HPF: NONE SEEN /HPF (ref <=5)
pH: 6 (ref 5.0–8.0)

## 2019-04-08 LAB — URINE CULTURE
MICRO NUMBER:: 1010974
SPECIMEN QUALITY:: ADEQUATE

## 2019-04-08 LAB — SEDIMENTATION RATE: Sed Rate: 11 mm/h (ref 0–30)

## 2019-04-08 LAB — LACTIC ACID, PLASMA: LACTIC ACID: 1 mmol/L (ref 0.4–1.8)

## 2019-04-08 LAB — CULTURE INDICATED

## 2019-04-08 LAB — LIPASE: Lipase: 35 U/L (ref 7–60)

## 2019-04-13 DIAGNOSIS — K5904 Chronic idiopathic constipation: Secondary | ICD-10-CM | POA: Diagnosis not present

## 2019-04-13 DIAGNOSIS — K219 Gastro-esophageal reflux disease without esophagitis: Secondary | ICD-10-CM | POA: Diagnosis not present

## 2019-05-11 ENCOUNTER — Other Ambulatory Visit: Payer: Self-pay

## 2019-05-30 ENCOUNTER — Other Ambulatory Visit: Payer: Self-pay

## 2019-05-30 ENCOUNTER — Emergency Department (HOSPITAL_COMMUNITY)
Admission: EM | Admit: 2019-05-30 | Discharge: 2019-05-30 | Disposition: A | Discharge status: Home / Self Care | Payer: Medicare Other | Attending: Emergency Medicine | Admitting: Emergency Medicine

## 2019-05-30 ENCOUNTER — Emergency Department (HOSPITAL_COMMUNITY): Payer: Medicare Other

## 2019-05-30 ENCOUNTER — Encounter (HOSPITAL_COMMUNITY): Payer: Self-pay

## 2019-05-30 DIAGNOSIS — N183 Chronic kidney disease, stage 3 unspecified: Secondary | ICD-10-CM | POA: Diagnosis not present

## 2019-05-30 DIAGNOSIS — Z79899 Other long term (current) drug therapy: Secondary | ICD-10-CM | POA: Insufficient documentation

## 2019-05-30 DIAGNOSIS — I2510 Atherosclerotic heart disease of native coronary artery without angina pectoris: Secondary | ICD-10-CM | POA: Diagnosis not present

## 2019-05-30 DIAGNOSIS — I129 Hypertensive chronic kidney disease with stage 1 through stage 4 chronic kidney disease, or unspecified chronic kidney disease: Secondary | ICD-10-CM | POA: Insufficient documentation

## 2019-05-30 DIAGNOSIS — R519 Headache, unspecified: Secondary | ICD-10-CM | POA: Diagnosis present

## 2019-05-30 DIAGNOSIS — R0789 Other chest pain: Secondary | ICD-10-CM | POA: Diagnosis not present

## 2019-05-30 DIAGNOSIS — Z87891 Personal history of nicotine dependence: Secondary | ICD-10-CM | POA: Diagnosis not present

## 2019-05-30 DIAGNOSIS — Z7982 Long term (current) use of aspirin: Secondary | ICD-10-CM | POA: Insufficient documentation

## 2019-05-30 DIAGNOSIS — Z7902 Long term (current) use of antithrombotics/antiplatelets: Secondary | ICD-10-CM | POA: Insufficient documentation

## 2019-05-30 DIAGNOSIS — R079 Chest pain, unspecified: Secondary | ICD-10-CM

## 2019-05-30 LAB — BASIC METABOLIC PANEL
Anion gap: 10 (ref 5–15)
BUN: 19 mg/dL (ref 8–23)
CO2: 22 mmol/L (ref 22–32)
Calcium: 8.3 mg/dL — ABNORMAL LOW (ref 8.9–10.3)
Chloride: 105 mmol/L (ref 98–111)
Creatinine, Ser: 0.98 mg/dL (ref 0.44–1.00)
GFR calc Af Amer: >60 mL/min (ref >60)
GFR calc non Af Amer: 58 mL/min — ABNORMAL LOW (ref >60)
Glucose, Bld: 115 mg/dL — ABNORMAL HIGH (ref 70–99)
Potassium: 4 mmol/L (ref 3.5–5.1)
Sodium: 137 mmol/L (ref 135–145)

## 2019-05-30 LAB — CBC WITH DIFFERENTIAL/PLATELET
Abs Immature Granulocytes: 0 10*3/uL (ref 0.00–0.07)
Basophils Absolute: 0.1 10*3/uL (ref 0.0–0.1)
Basophils Relative: 1 %
Eosinophils Absolute: 0.2 10*3/uL (ref 0.0–0.5)
Eosinophils Relative: 6 %
HCT: 35.9 % — ABNORMAL LOW (ref 36.0–46.0)
Hemoglobin: 12 g/dL (ref 12.0–15.0)
Immature Granulocytes: 0 %
Lymphocytes Relative: 51 %
Lymphs Abs: 2.1 10*3/uL (ref 0.7–4.0)
MCH: 30.5 pg (ref 26.0–34.0)
MCHC: 33.4 g/dL (ref 30.0–36.0)
MCV: 91.3 fL (ref 80.0–100.0)
Monocytes Absolute: 0.5 10*3/uL (ref 0.1–1.0)
Monocytes Relative: 11 %
Neutro Abs: 1.3 10*3/uL — ABNORMAL LOW (ref 1.7–7.7)
Neutrophils Relative %: 31 %
Platelets: 253 10*3/uL (ref 150–400)
RBC: 3.93 MIL/uL (ref 3.87–5.11)
RDW: 11.7 % (ref 11.5–15.5)
WBC: 4.1 10*3/uL (ref 4.0–10.5)
nRBC: 0 % (ref 0.0–0.2)

## 2019-05-30 LAB — TROPONIN I (HIGH SENSITIVITY)
Troponin I (High Sensitivity): 5 ng/L (ref <18)
Troponin I (High Sensitivity): 11 ng/L (ref <18)

## 2019-05-30 LAB — PROTIME-INR
INR: 1 (ref 0.8–1.2)
Prothrombin Time: 13.1 seconds (ref 11.4–15.2)

## 2019-05-30 LAB — APTT: aPTT: 28 seconds (ref 24–36)

## 2019-05-30 MED ORDER — SODIUM CHLORIDE 0.9 % IV SOLN
Freq: Once | INTRAVENOUS | Status: AC
  Administered 2019-05-30: 02:00:00 via INTRAVENOUS

## 2019-05-30 MED ORDER — METOCLOPRAMIDE HCL 5 MG/ML IJ SOLN
10.0000 mg | INTRAMUSCULAR | Status: AC
  Administered 2019-05-30: 10 mg via INTRAVENOUS
  Filled 2019-05-30: qty 2

## 2019-05-30 NOTE — ED Notes (Signed)
pts headache is better but noy gone

## 2019-05-30 NOTE — Discharge Instructions (Addendum)
Your work-up in the emergency department today was reassuring.  We advise that you continue your daily prescribed medications.  Follow-up with your primary care doctor for repeat assessment.  Return if symptoms persist or worsen.

## 2019-05-30 NOTE — ED Notes (Signed)
Pt taken to xray now

## 2019-05-30 NOTE — ED Notes (Signed)
PA to bedside

## 2019-05-30 NOTE — ED Triage Notes (Signed)
Pt bib [facility] from home after c/o sudden onset of chest pain. Per EMS pt had a headache all day, went to bed and woke up with sharp chest pain. Pt also then developed numbness and tingling in her left arm. On arrival, EMS noted possible slurred speech. LKN 0015. Pt has a hx of TIA and is on plavix. Pt AOx4.  EMS VS: BP 190/90 HR 70 CBG 111 RR 18 SPO2 98%

## 2019-05-30 NOTE — ED Notes (Signed)
I went to start iv fluid and med pt was taken to c-t

## 2019-05-30 NOTE — ED Notes (Signed)
Headache all day with lt arm pain this evening  Alert oriented skin warm and dry

## 2019-05-30 NOTE — ED Provider Notes (Signed)
[facility] EMERGENCY DEPARTMENT Provider Note   CSN: [id number] Arrival date & time: 05/30/19  0057     History Chief Complaint  Patient presents with  . Chest Pain  . stroke like symptoms    Traci [name] is a 73 y.o. female.   73 year old female with a history of hypertension, dyslipidemia, CAD, atrial fibrillation (on ASA/Plavix), PAD (s/p subclavian aa stenting and bypass), AAA s/p repair, and anxiety presents to the emergency department for multiple complaints.  She states that she awoke yesterday morning with an atraumatic frontal headache with associated pressure behind her right eye.  Her headache has remained constant and was noted to be associated with tinnitus in her left ear and subjective paresthesias in her left arm and leg.  She took Tylenol for symptoms without relief.  Prompted call to EMS this evening when she also developed stabbing left-sided chest pain radiating to her left shoulder blade as well as her left upper extremity.  She had some shortness of breath with symptoms.  Denies nausea, vomiting, diaphoresis, leg swelling, fevers, syncope.  Further denies any extremity weakness or vision changes/loss since onset of her headache.  Is presently chest pain-free, but feels that her heart is "fluttering" on and off.  Denies palpitations at this time as well.  Has been compliant with her daily medications.  The history is provided by the patient. No language interpreter was used.  Chest Pain      Past Medical History:  Diagnosis Date  . Anxiety   . Atrial fibrillation ([place])   . CAD (coronary artery disease)   . Claudication ([place]) 01/06/2008   Lower extremity dopplers - no evidence of arterial insufficiency, normal exam  . Coronary artery disease   . GERD (gastroesophageal reflux disease)   . Hyperlipidemia   . Hypertension 11/30/2010   echo- EF 55%; normal w/ mildly sclerotic aortic valve  . Hypertension 11/05/11   renal dopplers -  celiac artery and SMA >50% diameter reduction, R renal artery - mildly elevated velocities 1-59% diameter reduction, L renal artery normal  . Nonspecific ST-T wave electrocardiographic changes 03/26/2011   R/Lmv - EF 74%, normal perfusion all regions, ST depression w/ Lexiscan infusion w/o assoc angina  . Osteopenia   . PAD (peripheral artery disease) ([place])   . Peripheral vascular disease ([place])   . PVD (peripheral vascular disease) ([place]) 11/05/2011   doppler - R/L brachial pressures essentially equal w/o inflow disease; L sublclavian/CCA bypass graft demonstrates patent flow, no evidence of significant stenosis  . Sigmoid diverticulitis     Patient Active Problem List   Diagnosis Date Noted  . Atherosclerosis of aorta ([place]) 01/04/2018  . Osteoporosis 12/25/2017  . CKD (chronic kidney disease) stage 3, GFR 30-59 ml/min 08/04/2017  . Primary osteoarthritis of first carpometacarpal joint of left hand 07/10/2017  . Strain of right Achilles tendon 04/14/2017  . Atypical nevus 08/22/2016  . Acute bilateral low back pain with left-sided sciatica 06/06/2016  . Osteopenia 12/13/2015  . Menopause 12/08/2015  . History of colonoscopy 07/17/2015  . Irregular heartbeat 07/26/2014  . 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 Preoperative evaluation of a medical condition to rule out surgical contraindications (TAR required) 01/17/2014  . 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[place]) 01/16/2014  . Hyperlipidemia 04/05/2013  . Hypertension 12/06/2012  . Peripheral arterial disease ([place]) 12/06/2012  . CAD, cath 2009 12/06/2012  . Hyponatremia, improved holding diuretic 12/06/2012  . Atherosclerosis of other specified arteries 04/02/2012  . 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  Stricture of artery ([place]) 10/02/2011    Past Surgical History:  Procedure Laterality Date  . ABDOMINAL AORTIC ANEURYSM REPAIR N/A 01/27/2014   Procedure: AORTO-SUPERIOR MESENTERIC ARTERY BYPASS GRAFT;  Surgeon: Angelia Mould, MD;   Location: [facility];  Service: Vascular;  Laterality: N/A;  . ABDOMINAL HYSTERECTOMY    . APPENDECTOMY    . CARDIAC CATHETERIZATION  06/03/2008   60% LAD, involving D2, borderline significant by IVUS, medical therapy. CFX, RCA OK.  Marland Kitchen CATARACT EXTRACTION    . CHOLECYSTECTOMY    . EYE SURGERY     retinal surgery  . SPINE SURGERY  Feb. 2011  . [place]   X's  several  . SUBCLAVIAN ARTERY STENT Left 09/20/2008   LSA ISR 7x21mm Cordis Genesis on Opta premount, reduction from 80% to 0%  . subclavian artery stents  01/23/2010   Left carotid to subclavian artery Bypass  . VISCERAL ANGIOGRAM  01/17/2014   Procedure: VISCERAL ANGIOGRAM;  Surgeon: Angelia Mould, MD;  Location: [facility] CATH LAB;  Service: Cardiovascular;;     OB History   No obstetric history on file.     Family History  Problem Relation Age of Onset  . Heart disease Father 71       Heart Disease before age 63  . Kidney disease Father   . Heart attack Father   . Hyperlipidemia Father   . Hypertension Father   . Kidney failure Mother   . Diabetes Maternal Grandmother   . Heart disease Paternal Grandfather     Social History   Tobacco Use  . Smoking status: Former Smoker    Packs/day: 0.25    Years: 20.00    Pack years: 5.00    Types: Cigarettes    Quit date: 09/30/1993    Years since quitting: 25.6  . Smokeless tobacco: Never Used  Substance Use Topics  . Alcohol use: No  . Drug use: No    Home Medications Prior to Admission medications   Medication Sig Start Date End Date Taking? Authorizing Provider  aspirin EC 81 MG tablet Take 81 mg by mouth daily.   Yes [provider]  atenolol (TENORMIN) 25 MG tablet TAKE 1 TABLET DAILY Patient taking differently: Take 25 mg by mouth daily.  03/02/19  Yes Croitoru, Mihai, MD  atorvastatin (LIPITOR) 10 MG tablet TAKE 1 TABLET DAILY AT 6 P.M. Patient taking differently: Take 10 mg by mouth daily at 6 PM.  05/08/18  Yes Croitoru, Mihai, MD    candesartan (ATACAND) 16 MG tablet Take 0.5 tablets (8 mg total) by mouth daily. 05/11/18  Yes Croitoru, Mihai, MD  cholecalciferol (VITAMIN D) 1000 UNITS tablet Take 1,000 Units by mouth daily.   Yes [provider]  clopidogrel (PLAVIX) 75 MG tablet TAKE 1 TABLET DAILY Patient taking differently: Take 75 mg by mouth daily.  05/08/18  Yes Croitoru, Mihai, MD  clorazepate (TRANXENE) 7.5 MG tablet Take 1 tablet (7.5 mg total) by mouth daily as needed. Patient taking differently: Take 7.5 mg by mouth daily as needed for anxiety or sleep.  04/06/19  Yes Gregor Hams, MD  cyanocobalamin 1000 MCG tablet Take 100 mcg by mouth daily.   Yes [provider]  dicyclomine (BENTYL) 10 MG capsule Take 1 capsule (10 mg total) by mouth 4 (four) times daily -  before meals and at bedtime. 03/29/19  Yes Gregor Hams, MD  EPINEPHrine (EPIPEN 2-PAK) 0.3 mg/0.3 mL IJ SOAJ injection Inject 0.3 mLs (  0.3 mg total) into the muscle as needed (for allergic reaction). 12/23/17  Yes Gregor Hams, MD  esomeprazole (NEXIUM) 40 MG capsule Take 1 capsule (40 mg total) by mouth daily. Take 1 tab daily Patient taking differently: Take 40 mg by mouth daily.  02/23/15  Yes Croitoru, Mihai, MD  estradiol (ESTRACE) 0.5 MG tablet Take 1 tablet (0.5 mg total) by mouth daily. 03/29/19  Yes Gregor Hams, MD  furosemide (LASIX) 20 MG tablet TAKE 1 TABLET EVERY OTHER DAY Patient taking differently: Take 20 mg by mouth 3 (three) times a week.  06/11/18  Yes Croitoru, Mihai, MD  linaclotide (LINZESS) 145 MCG CAPS capsule Take 1 capsule by mouth daily as needed. 12/22/15  Yes [provider]  meclizine (ANTIVERT) 25 MG tablet TAKE ONE-HALF (1/2) TABLET THREE TIMES A DAY AS NEEDED FOR DIZZINESS OR NAUSEA Patient taking differently: Take 12.5 mg by mouth 3 (three) times daily as needed for dizziness or nausea.  03/29/19  Yes Gregor Hams, MD  Multiple Vitamin (MULTIVITAMIN WITH MINERALS) TABS Take 1 tablet by mouth  daily.   Yes [provider]  zolpidem (AMBIEN) 10 MG tablet Take 1 tablet (10 mg total) by mouth at bedtime as needed for sleep. 04/06/19  Yes Gregor Hams, MD    Allergies    Cortisone, Dilaudid [hydromorphone hcl], Iodine, Medrol [methylprednisolone], Omnipaque [iohexol], Prednisone, Shellfish allergy, Sulfa drugs cross reactors, Doxycycline, Methylprednisolone sodium succ, Sulfa antibiotics, Augmentin [amoxicillin-pot clavulanate], Codeine, Erythromycin, and Morphine and related  Review of Systems   Review of Systems  Cardiovascular: Positive for chest pain.  Ten systems reviewed and are negative for acute change, except as noted in the HPI.    Physical Exam Updated Vital Signs BP (!) 139/56   Pulse 64   Resp 17   SpO2 97%   Physical Exam Vitals and nursing note reviewed.  Constitutional:      General: She is not in acute distress.    Appearance: She is well-developed. She is not diaphoretic.     Comments: Nontoxic appearing and in NAD  HENT:     Head: Normocephalic and atraumatic.     Mouth/Throat:     Mouth: Mucous membranes are moist.     Comments: Symmetric rise of the uvula with phonation Eyes:     General: No scleral icterus.    Extraocular Movements: Extraocular movements intact.     Conjunctiva/sclera: Conjunctivae normal.     Pupils: Pupils are equal, round, and reactive to light.     Comments: No nystagmus  Neck:     Comments: No nuchal rigidity or meningismus  Cardiovascular:     Rate and Rhythm: Normal rate and regular rhythm.     Pulses: Normal pulses.  Pulmonary:     Effort: Pulmonary effort is normal. No respiratory distress.     Breath sounds: No stridor. No wheezing.     Comments: Respirations even and unlabored Musculoskeletal:        General: Normal range of motion.     Cervical back: Normal range of motion.  Skin:    General: Skin is warm and dry.     Coloration: Skin is not pale.     Findings: No erythema or rash.  Neurological:       General: No focal deficit present.     Mental Status: She is alert and oriented to person, place, and time.     Cranial Nerves: No cranial nerve deficit.     Coordination: Coordination  normal.     Comments: GCS 15. Speech is goal oriented. No cranial nerve deficits appreciated; symmetric eyebrow raise, no facial drooping, tongue midline. Patient has equal grip strength bilaterally with 5/5 strength against resistance in all major muscle groups bilaterally. Sensation to light touch intact. Report subjective decreased sensation to the left hemiface; otherwise equal. Patient moves extremities without ataxia. No pronatory drift.   Psychiatric:        Behavior: Behavior normal.     ED Results / Procedures / Treatments   Labs (all labs ordered are listed, but only abnormal results are displayed) Labs Reviewed  BASIC METABOLIC PANEL - Abnormal; Notable for the following components:      Result Value   Glucose, Bld 115 (*)    Calcium 8.3 (*)    GFR calc non Af Amer 58 (*)    All other components within normal limits  CBC WITH DIFFERENTIAL/PLATELET - Abnormal; Notable for the following components:   HCT 35.9 (*)    Neutro Abs 1.3 (*)    All other components within normal limits  APTT  PROTIME-INR  URINALYSIS, ROUTINE W REFLEX MICROSCOPIC  TROPONIN I (HIGH SENSITIVITY)  TROPONIN I (HIGH SENSITIVITY)    EKG EKG Interpretation  Date/Time:  Sunday May 30 2019 01:18:56 EST Ventricular Rate:  59 PR Interval:    QRS Duration: 96 QT Interval:  430 QTC Calculation: 426 R Axis:   48 Text Interpretation: Sinus rhythm Borderline T abnormalities, anterior leads No significant change since last tracing Confirmed by Pryor Curia [phone]) on 05/30/2019 1:21:23 AM   Radiology DG Chest 2 View  Result Date: 05/30/2019 CLINICAL DATA:  Chest pain for 1 day EXAM: CHEST - 2 VIEW COMPARISON:  07/03/2018 FINDINGS: Cardiac shadows within normal limits. Left subclavian arterial stent is again  seen. Aortic calcifications are noted. The lungs are clear. No acute bony abnormality is noted. IMPRESSION: No active cardiopulmonary disease. Electronically Signed   By: Inez Catalina M.D.   On: 05/30/2019 01:55   CT Head Wo Contrast  Result Date: 05/30/2019 CLINICAL DATA:  Headache EXAM: CT HEAD WITHOUT CONTRAST TECHNIQUE: Contiguous axial images were obtained from the base of the skull through the vertex without intravenous contrast. COMPARISON:  09/07/2016 FINDINGS: Brain: There is no mass, hemorrhage or extra-axial collection. There is generalized atrophy without lobar predilection. Hypodensity of the white matter is most commonly associated with chronic microvascular disease. Vascular: No abnormal hyperdensity of the major intracranial arteries or dural venous sinuses. No intracranial atherosclerosis. Skull: The visualized skull base, calvarium and extracranial soft tissues are normal. Sinuses/Orbits: No fluid levels or advanced mucosal thickening of the visualized paranasal sinuses. No mastoid or middle ear effusion. The orbits are normal. IMPRESSION: Generalized atrophy and chronic microvascular ischemia without acute intracranial abnormality. Electronically Signed   By: Ulyses Jarred M.D.   On: 05/30/2019 02:05   CLINICAL DATA: Headache  EXAM: CT HEAD WITHOUT CONTRAST  TECHNIQUE: Contiguous axial images were obtained from the base of the skull through the vertex without intravenous contrast.  COMPARISON: 09/07/2016  FINDINGS: Brain: There is no mass, hemorrhage or extra-axial collection. There is generalized atrophy without lobar predilection. Hypodensity of the white matter is most commonly associated with chronic microvascular disease.  Vascular: No abnormal hyperdensity of the major intracranial arteries or dural venous sinuses. No intracranial atherosclerosis.  Skull: The visualized skull base, calvarium and extracranial soft tissues are normal.  Sinuses/Orbits: No fluid  levels or advanced mucosal thickening of the visualized paranasal sinuses. No mastoid or  middle ear effusion. The orbits are normal.  IMPRESSION: Generalized atrophy and chronic microvascular ischemia without acute intracranial abnormality.  Electronically Signed By: Ulyses Jarred M.D. On: 05/30/2019 02:05   Procedures Procedures (including critical care time)  Medications Ordered in ED Medications  metoCLOPramide (REGLAN) injection 10 mg (10 mg Intravenous Given 05/30/19 0206)  0.9 %  sodium chloride infusion ( Intravenous Stopped 05/30/19 0446)    ED Course  I have reviewed the triage vital signs and the nursing notes.  Pertinent labs & imaging results that were available during my care of the patient were reviewed by me and considered in my medical decision making (see chart for details).  4:42 AM Patient reassessed. She states she is feeling much better. Declines additional medications.  Communicated plan for delta troponin.  Patient verbalizes understanding as well as comfort with discharge if troponin stable.  Remainder of work-up this evening has been reassuring.  6:28 AM Repeat troponin is negative. Patient hemodynamically stable. Will discharge with instruction for PCP follow up.  6:39 AM Patient continuing to feel well.  All questions answered.     MDM Rules/Calculators/A&P     CHA2DS2/VAS Stroke Risk Points      N/A >= 2 Points: High Risk  1 - 1.99 Points: Medium Risk  0 Points: Low Risk    A final score could not be computed because of missing components.: Last  Change: N/A     This score determines the patient's risk of having a stroke if the  patient has atrial fibrillation.      This score is not applicable to this patient. Components are not  calculated.                    73 year old female presenting for multiple complaints.  States that she had onset of a frontal headache yesterday followed by onset of chest pain in the afternoon.  Noted to be  well-appearing with stable and reassuring vital signs.  Her neurologic exam in the emergency department has been nonfocal.  She has not had any clinical decompensation over prolonged observation.  Work-up included CT head which is negative for CVA, mass, hydrocephalus.  Her headache has improved with Reglan and IV fluids.  Suspect symptoms to be secondary to tension headache versus common migraine.    With respect to chest pain, low suspicion for cardiac etiology given reassuring workup today.  EKG is nonischemic and troponin negative x 2.  Chest x-ray without evidence of mediastinal widening to suggest dissection.  No pneumothorax, pneumonia, pleural effusion.    Given overall symptomatic improvement, I feel the patient is stable for discharge and continued follow-up with her primary doctor.  She is comfortable with this plan.  I have encouraged her to continue her daily medications.  Return precautions discussed and provided. Patient discharged in stable condition with no unaddressed concerns.   Final Clinical Impression(s) / ED Diagnoses Final diagnoses:  Frontal headache  Nonspecific chest pain    Rx / DC Orders ED Discharge Orders    None       Antonietta Breach, PA-C 05/30/19 [place], Delice Bison, DO 05/30/19 [phone]

## 2019-06-02 ENCOUNTER — Encounter: Payer: Self-pay | Admitting: Family Medicine

## 2019-06-03 ENCOUNTER — Encounter: Payer: Self-pay | Admitting: Family Medicine

## 2019-06-03 ENCOUNTER — Ambulatory Visit (INDEPENDENT_AMBULATORY_CARE_PROVIDER_SITE_OTHER): Payer: Medicare Other | Admitting: Family Medicine

## 2019-06-03 ENCOUNTER — Telehealth: Payer: Self-pay | Admitting: Family Medicine

## 2019-06-03 ENCOUNTER — Other Ambulatory Visit: Payer: Self-pay

## 2019-06-03 VITALS — BP 138/55 | HR 68 | Ht 64.0 in | Wt 155.0 lb

## 2019-06-03 DIAGNOSIS — R0789 Other chest pain: Secondary | ICD-10-CM

## 2019-06-03 DIAGNOSIS — I6523 Occlusion and stenosis of bilateral carotid arteries: Secondary | ICD-10-CM | POA: Diagnosis not present

## 2019-06-03 NOTE — Progress Notes (Signed)
Established Patient Office Visit  Subjective:  Patient ID: Traci [name], female    DOB: 1945/08/29  Age: 73 y.o. MRN: [id number]  CC:  Chief Complaint  Patient presents with  . Hospitalization Follow-up    HPI Traci [name] -[place] presents for emergency department follow-up.  She is a 73 year old female with a history of hypertension and CAD and A. Fib, PVD and mesenteric ischemia who presented to the emergency department on December 13.  Day before she woke up with a frontal headache associated with pressure behind her right eye. HA lasted most of the day and tried Tylenol with no relief.     ON the 13 th she had been baking cookies all day and then when she went to lay down in bed she felt a sudden sharp pain under her left breast that lasted about 30 sec and then resolved. She says it was a stabbing pain that shot into her back. Denies any reflux sxs. . She also started having some ear ringing in her left ear as well as some paresthesias in her left arm and leg.  She took Tylenol and then called EMS.    CT head showed generalized atrophy with chronic microvascular ischemic changes but no acute abnormalities.  Normal CBC.  Low calcium level at 8.3.   She hasn't had anymore sxs since then.  She has been doing well.    Hx of hypocalcemia.  Takes TUMs daily.  She says her RLS is worse lately but she has been more active.  Has had some occ cramping.    Past Medical History:  Diagnosis Date  . Anxiety   . Atrial fibrillation ([place])   . CAD (coronary artery disease)   . Claudication ([place]) 01/06/2008   Lower extremity dopplers - no evidence of arterial insufficiency, normal exam  . Coronary artery disease   . GERD (gastroesophageal reflux disease)   . Hyperlipidemia   . Hypertension 11/30/2010   echo- EF 55%; normal w/ mildly sclerotic aortic valve  . Hypertension 11/05/11   renal dopplers - celiac artery and SMA >50% diameter reduction, R renal artery - mildly elevated  velocities 1-59% diameter reduction, L renal artery normal  . Nonspecific ST-T wave electrocardiographic changes 03/26/2011   R/Lmv - EF 74%, normal perfusion all regions, ST depression w/ Lexiscan infusion w/o assoc angina  . Osteopenia   . PAD (peripheral artery disease) ([place])   . Peripheral vascular disease ([place])   . PVD (peripheral vascular disease) ([place]) 11/05/2011   doppler - R/L brachial pressures essentially equal w/o inflow disease; L sublclavian/CCA bypass graft demonstrates patent flow, no evidence of significant stenosis  . Sigmoid diverticulitis     Past Surgical History:  Procedure Laterality Date  . ABDOMINAL AORTIC ANEURYSM REPAIR N/A 01/27/2014   Procedure: AORTO-SUPERIOR MESENTERIC ARTERY BYPASS GRAFT;  Surgeon: Angelia Mould, MD;  Location: [facility];  Service: Vascular;  Laterality: N/A;  . ABDOMINAL HYSTERECTOMY    . APPENDECTOMY    . CARDIAC CATHETERIZATION  06/03/2008   60% LAD, involving D2, borderline significant by IVUS, medical therapy. CFX, RCA OK.  Marland Kitchen CATARACT EXTRACTION    . CHOLECYSTECTOMY    . EYE SURGERY     retinal surgery  . SPINE SURGERY  Feb. 2011  . [place]   X's  several  . SUBCLAVIAN ARTERY STENT Left 09/20/2008   LSA ISR 7x32mm Cordis Genesis on Opta premount, reduction from 80% to 0%  . subclavian artery stents  01/23/2010   Left carotid to subclavian artery Bypass  . VISCERAL ANGIOGRAM  01/17/2014   Procedure: VISCERAL ANGIOGRAM;  Surgeon: Angelia Mould, MD;  Location: [facility] CATH LAB;  Service: Cardiovascular;;    Family History  Problem Relation Age of Onset  . Heart disease Father 4       Heart Disease before age 35  . Kidney disease Father   . Heart attack Father   . Hyperlipidemia Father   . Hypertension Father   . Kidney failure Mother   . Diabetes Maternal Grandmother   . Heart disease Paternal Grandfather     Social History   Socioeconomic History  . Marital status: Widowed    Spouse name: Not  on file  . Number of children: 1  . Years of education: 52  . Highest education level: Bachelor's degree (e.g., BA, AB, BS)  Occupational History  . Occupation: retired    Comment: Marine scientist  Tobacco Use  . Smoking status: Former Smoker    Packs/day: 0.25    Years: 20.00    Pack years: 5.00    Types: Cigarettes    Quit date: 09/30/1993    Years since quitting: 25.6  . Smokeless tobacco: Never Used  Substance and Sexual Activity  . Alcohol use: No  . Drug use: No  . Sexual activity: Never  Other Topics Concern  . 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 No caffeine use.   Social Determinants of Health   Financial Resource Strain: Low Risk   . Difficulty of Paying Living Expenses: Not hard at all  Food Insecurity: No Food Insecurity  . Worried About Charity fundraiser in the Last Year: Never true  . Ran Out of Food in the Last Year: Never true  Transportation Needs: No Transportation Needs  . Lack of Transportation (Medical): No  . Lack of Transportation (Non-Medical): No  Physical Activity: Insufficiently Active  . Days of Exercise per Week: 3 days  . Minutes of Exercise per Session: 30 min  Stress: No Stress Concern Present  . Feeling of Stress : Not at all  Social Connections: Slightly Isolated  . Frequency of Communication with Friends and Family: More than three times a week  . Frequency of Social Gatherings with Friends and Family: Twice a week  . Attends Religious Services: More than 4 times per year  . Active Member of Clubs or Organizations: Yes  . Attends Archivist Meetings: More than 4 times per year  . Marital Status: Widowed  Intimate Partner Violence: Not At Risk  . Fear of Current or Ex-Partner: No  . Emotionally Abused: No  . Physically Abused: No  . Sexually Abused: No    Outpatient Medications Prior to Visit  Medication Sig Dispense Refill  . aspirin EC 81 MG tablet Take 81 mg by mouth  daily.    Marland Kitchen atenolol (TENORMIN) 25 MG tablet TAKE 1 TABLET DAILY (Patient taking differently: Take 25 mg by mouth daily. ) 90 tablet 3  . atorvastatin (LIPITOR) 10 MG tablet TAKE 1 TABLET DAILY AT 6 P.M. (Patient taking differently: Take 10 mg by mouth daily at 6 PM. ) 90 tablet 4  . candesartan (ATACAND) 16 MG tablet Take 0.5 tablets (8 mg total) by mouth daily. 45 tablet 3  . cholecalciferol (VITAMIN D) 1000 UNITS tablet Take 1,000 Units by mouth daily.    . clopidogrel (PLAVIX) 75 MG tablet TAKE 1  TABLET DAILY (Patient taking differently: Take 75 mg by mouth daily. ) 90 tablet 4  . clorazepate (TRANXENE) 7.5 MG tablet Take 1 tablet (7.5 mg total) by mouth daily as needed. (Patient taking differently: Take 7.5 mg by mouth daily as needed for anxiety or sleep. ) 90 tablet 1  . cyanocobalamin 1000 MCG tablet Take 100 mcg by mouth daily.    Marland Kitchen dicyclomine (BENTYL) 10 MG capsule Take 1 capsule (10 mg total) by mouth 4 (four) times daily -  before meals and at bedtime. 90 capsule 0  . EPINEPHrine (EPIPEN 2-PAK) 0.3 mg/0.3 mL IJ SOAJ injection Inject 0.3 mLs (0.3 mg total) into the muscle as needed (for allergic reaction). 2 Device 0  . esomeprazole (NEXIUM) 40 MG capsule Take 1 capsule (40 mg total) by mouth daily. Take 1 tab daily (Patient taking differently: Take 40 mg by mouth daily. ) 90 capsule 0  . estradiol (ESTRACE) 0.5 MG tablet Take 1 tablet (0.5 mg total) by mouth daily. 90 tablet 3  . furosemide (LASIX) 20 MG tablet TAKE 1 TABLET EVERY OTHER DAY (Patient taking differently: Take 20 mg by mouth 3 (three) times a week. ) 45 tablet 6  . linaclotide (LINZESS) 145 MCG CAPS capsule Take 1 capsule by mouth daily as needed.    . meclizine (ANTIVERT) 25 MG tablet TAKE ONE-HALF (1/2) TABLET THREE TIMES A DAY AS NEEDED FOR DIZZINESS OR NAUSEA (Patient taking differently: Take 12.5 mg by mouth 3 (three) times daily as needed for dizziness or nausea. ) 90 tablet 6  . Multiple Vitamin (MULTIVITAMIN WITH  MINERALS) TABS Take 1 tablet by mouth daily.    Marland Kitchen zolpidem (AMBIEN) 10 MG tablet Take 1 tablet (10 mg total) by mouth at bedtime as needed for sleep. 90 tablet 1   No facility-administered medications prior to visit.    Allergies  Allergen Reactions  . Cortisone Anaphylaxis  . Dilaudid [Hydromorphone Hcl] Nausea And Vomiting    Pt states she will start vomiting immediately for 6 hours  . Iodine Anaphylaxis  . Medrol [Methylprednisolone] Anaphylaxis  . Omnipaque [Iohexol] Anaphylaxis  . Prednisone Anaphylaxis and Swelling  . Shellfish Allergy Anaphylaxis  . Sulfa Drugs Cross Reactors Anaphylaxis  . Doxycycline     Thrush/itching  . Methylprednisolone Sodium Succ Swelling    Blood pressure drops immediately  . Sulfa Antibiotics Other (See Comments)    Stomach upset  . Augmentin [Amoxicillin-Pot Clavulanate] Nausea Only    Upset stomach (Has taken cephalexin in the past without problems)  . Codeine Rash and Other (See Comments)    Upset stomach  . Erythromycin Rash  . Morphine And Related Nausea Only    nausea    ROS Review of Systems    Objective:    Physical Exam  Constitutional: She is oriented to person, place, and time. She appears well-developed and well-nourished.  HENT:  Head: Normocephalic and atraumatic.  Cardiovascular: Normal rate, regular rhythm and normal heart sounds.  Pulmonary/Chest: Effort normal and breath sounds normal.  Neurological: She is alert and oriented to person, place, and time.  Skin: Skin is warm and dry.  Psychiatric: She has a normal mood and affect. Her behavior is normal.    BP (!) 138/55   Pulse 68   Ht 5\' 4"  (1.626 m)   Wt 155 lb (70.3 kg)   SpO2 97%   BMI 26.61 kg/m  Wt Readings from Last 3 Encounters:  06/03/19 155 lb (70.3 kg)  03/29/19 153 lb (69.4 kg)  02/17/19 152 lb 8 oz (69.2 kg)     There are no preventive care reminders to display for this patient.  There are no preventive care reminders to display for this  patient.  Lab Results  Component Value Date   TSH 2.14 07/03/2018   Lab Results  Component Value Date   WBC 4.1 05/30/2019   HGB 12.0 05/30/2019   HCT 35.9 (L) 05/30/2019   MCV 91.3 05/30/2019   PLT 253 05/30/2019   Lab Results  Component Value Date   NA 137 05/30/2019   K 4.0 05/30/2019   CO2 22 05/30/2019   GLUCOSE 115 (H) 05/30/2019   BUN 19 05/30/2019   CREATININE 0.98 05/30/2019   BILITOT 0.4 04/06/2019   ALKPHOS 58 02/17/2019   AST 17 04/06/2019   ALT 14 04/06/2019   PROT 6.9 04/06/2019   ALBUMIN 4.3 02/17/2019   CALCIUM 8.3 (L) 05/30/2019   ANIONGAP 10 05/30/2019   Lab Results  Component Value Date   CHOL 129 02/17/2019   Lab Results  Component Value Date   HDL 47 02/17/2019   Lab Results  Component Value Date   LDLCALC 53 02/17/2019   Lab Results  Component Value Date   TRIG 178 (H) 02/17/2019   Lab Results  Component Value Date   CHOLHDL 2.7 02/17/2019   No results found for: HGBA1C    Assessment & Plan:   Problem List Items Addressed This Visit      Other   Hypocalcemia   Relevant Orders   PTH, Intact and Calcium   Vitamin D 25 hydroxy   Phosphorus    Other Visit Diagnoses    Atypical chest pain    -  Primary   Relevant Orders   PTH, Intact and Calcium   Vitamin D 25 hydroxy   Phosphorus        Atypical CP - very brief. No recurrence. ED work-up negative. No worrisome finding. Monitor for new sxs. No recent reflux.  Consider GI.  She does occ have epigastric pain and has to push on her abdomen to help her bowels move.   Hypocalcemia - noted on ED labs. Has been low for last 2 years. Will work up further with PTH, vit D, and phos.    No orders of the defined types were placed in this encounter.   Follow-up: Return if symptoms worsen or fail to improve.    Beatrice Lecher, MD

## 2019-06-03 NOTE — Telephone Encounter (Signed)
Please call pt:  I would like to order some extra labs for her calcium levels. I would like to ck her parathyroid levels.  She can go to labs anytime.

## 2019-06-04 ENCOUNTER — Encounter: Payer: Self-pay | Admitting: Family Medicine

## 2019-06-04 NOTE — Telephone Encounter (Signed)
The patient is wanting her kidney function checked as well. Is this ok to add on since she had this done on 05/30/2019. Please advise.

## 2019-06-07 NOTE — Telephone Encounter (Signed)
Already checked her kidney function a week ago.

## 2019-06-07 NOTE — Telephone Encounter (Signed)
Mychart message sent to patient. No other questions at this time.

## 2019-06-08 NOTE — Progress Notes (Signed)
Subjective:   Traci [name] is a 73 y.o. female who presents for Medicare Annual (Subsequent) preventive examination.  Review of Systems:  No ROS.  Medicare Wellness Virtual Visit.  Visual/audio telehealth visit, UTA vital signs.   See social history for additional risk factors.    Cardiac Risk Factors include: advanced age (>73men, >69 women);family history of premature cardiovascular disease;hypertension Sleep patterns: Getting 6-8 hours of sleep a night. Wakes up 1 time 2 -3 times a week. Wakes up and feels rested and ready for the day.    Home Safety/Smoke Alarms: Feels safe in home. Smoke alarms in place.  Living environment; Lives alone in a 1 story home. No stairs in or around the home. Shower is a step over tub combo and grab bars in place.  Seat Belt Safety/Bike Helmet: Wears seat belt.   Female:   Pap-  Aged out     Mammo- UTD     Dexa scan-  UTD     CCS- UTD     Objective:     Vitals: BP (!) 137/50   Pulse 72   Ht 5\' 3"  (1.6 m)   Wt 156 lb (70.8 kg)   SpO2 100%   BMI 27.63 kg/m   Body mass index is 27.63 kg/m.  Advanced Directives 06/21/2019 05/30/2019 12/16/2018 06/15/2018 07/20/2017 01/14/2017 10/08/2016  Does Patient Have a Medical Advance Directive? Yes No Yes Yes No Yes Yes  Type of Paramedic of [place];Living will - [place];Living will [place];Living will - [place];Living will [place];Living will  Does patient want to make changes to medical advance directive? No - Patient declined - No - Patient declined No - Patient declined - - -  Copy of [place] in Chart? No - copy requested - - No - copy requested - - -  Would patient like information on creating a medical advance directive? - No - Patient declined - - No - Patient declined - -  Pre-existing out of facility DNR order (yellow form or pink MOST form) - - - - - - -     Tobacco Social History   Tobacco Use  Smoking Status Former Smoker  . Packs/day: 0.25  . Years: 20.00  . Pack years: 5.00  . Types: Cigarettes  . Quit date: 09/30/1993  . Years since quitting: 25.7  Smokeless Tobacco Never Used     Counseling given: No   Clinical Intake:  Pre-visit preparation completed: Yes  Pain : No/denies pain     Nutritional Risks: None Diabetes: No  How often do you need to have someone help you when you read instructions, pamphlets, or other written materials from your doctor or pharmacy?: 1 - Never What is the last grade level you completed in school?: 16  Interpreter Needed?: No  Information entered by :: Orlie Dakin, LPN  Past Medical History:  Diagnosis Date  . Anxiety   . Atrial fibrillation ([place])   . CAD (coronary artery disease)   . Claudication ([place]) 01/06/2008   Lower extremity dopplers - no evidence of arterial insufficiency, normal exam  . Coronary artery disease   . GERD (gastroesophageal reflux disease)   . Hyperlipidemia   . Hypertension 11/30/2010   echo- EF 55%; normal w/ mildly sclerotic aortic valve  . Hypertension 11/05/11   renal dopplers - celiac artery and SMA >50% diameter reduction, R renal artery - mildly elevated velocities 1-59% diameter  reduction, L renal artery normal  . Nonspecific ST-T wave electrocardiographic changes 03/26/2011   R/Lmv - EF 74%, normal perfusion all regions, ST depression w/ Lexiscan infusion w/o assoc angina  . Osteopenia   . PAD (peripheral artery disease) ([place])   . Peripheral vascular disease ([place])   . PVD (peripheral vascular disease) ([place]) 11/05/2011   doppler - R/L brachial pressures essentially equal w/o inflow disease; L sublclavian/CCA bypass graft demonstrates patent flow, no evidence of significant stenosis  . Sigmoid diverticulitis    Past Surgical History:  Procedure Laterality Date  . ABDOMINAL AORTIC ANEURYSM REPAIR N/A 01/27/2014   Procedure: AORTO-SUPERIOR MESENTERIC  ARTERY BYPASS GRAFT;  Surgeon: Angelia Mould, MD;  Location: [place];  Service: Vascular;  Laterality: N/A;  . ABDOMINAL HYSTERECTOMY    . APPENDECTOMY    . CARDIAC CATHETERIZATION  06/03/2008   60% LAD, involving D2, borderline significant by IVUS, medical therapy. CFX, RCA OK.  Marland Kitchen CATARACT EXTRACTION    . CHOLECYSTECTOMY    . EYE SURGERY     retinal surgery  . SPINE SURGERY  Feb. 2011  . [place]   X's  several  . SUBCLAVIAN ARTERY STENT Left 09/20/2008   LSA ISR 7x5mm Cordis Genesis on Opta premount, reduction from 80% to 0%  . subclavian artery stents  01/23/2010   Left carotid to subclavian artery Bypass  . VISCERAL ANGIOGRAM  01/17/2014   Procedure: VISCERAL ANGIOGRAM;  Surgeon: Angelia Mould, MD;  Location: [facility] CATH LAB;  Service: Cardiovascular;;   Family History  Problem Relation Age of Onset  . Heart disease Father 24       Heart Disease before age 43  . Kidney disease Father   . Heart attack Father   . Hyperlipidemia Father   . Hypertension Father   . Kidney failure Mother   . Diabetes Maternal Grandmother   . Heart disease Paternal Grandfather    Social History   Socioeconomic History  . Marital status: Widowed    Spouse name: Not on file  . Number of children: 1  . Years of education: 73  . Highest education level: Bachelor's degree (e.g., BA, AB, BS)  Occupational History  . Occupation: retired    Comment: Marine scientist  Tobacco Use  . Smoking status: Former Smoker    Packs/day: 0.25    Years: 20.00    Pack years: 5.00    Types: Cigarettes    Quit date: 09/30/1993    Years since quitting: 25.7  . Smokeless tobacco: Never Used  Substance and Sexual Activity  . Alcohol use: No  . Drug use: No  . Sexual activity: Not Currently  Other Topics Concern  . 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 No caffeine use.   Social Determinants of Health   Financial Resource Strain:   .  Difficulty of Paying Living Expenses: Not on file  Food Insecurity:   . Worried About Charity fundraiser in the Last Year: Not on file  . Ran Out of Food in the Last Year: Not on file  Transportation Needs:   . Lack of Transportation (Medical): Not on file  . Lack of Transportation (Non-Medical): Not on file  Physical Activity:   . Days of Exercise per Week: Not on file  . Minutes of Exercise per Session: Not on file  Stress:   . Feeling of Stress : Not on file  Social Connections:   .  Frequency of Communication with Friends and Family: Not on file  . Frequency of Social Gatherings with Friends and Family: Not on file  . Attends Religious Services: Not on file  . Active Member of Clubs or Organizations: Not on file  . Attends Archivist Meetings: Not on file  . Marital Status: Not on file    Outpatient Encounter Medications as of 06/21/2019  Medication Sig  . aspirin EC 81 MG tablet Take 81 mg by mouth daily.  Marland Kitchen atenolol (TENORMIN) 25 MG tablet TAKE 1 TABLET DAILY (Patient taking differently: Take 25 mg by mouth daily. )  . atorvastatin (LIPITOR) 10 MG tablet TAKE 1 TABLET DAILY AT 6 P.M. (Patient taking differently: Take 10 mg by mouth daily at 6 PM. )  . candesartan (ATACAND) 16 MG tablet Take 0.5 tablets (8 mg total) by mouth daily.  . cholecalciferol (VITAMIN D) 1000 UNITS tablet Take 1,000 Units by mouth daily.  . clopidogrel (PLAVIX) 75 MG tablet TAKE 1 TABLET DAILY (Patient taking differently: Take 75 mg by mouth daily. )  . clorazepate (TRANXENE) 7.5 MG tablet Take 1 tablet (7.5 mg total) by mouth daily as needed. (Patient taking differently: Take 7.5 mg by mouth daily as needed for anxiety or sleep. )  . cyanocobalamin 1000 MCG tablet Take 100 mcg by mouth daily.  Marland Kitchen dicyclomine (BENTYL) 10 MG capsule Take 1 capsule (10 mg total) by mouth 4 (four) times daily -  before meals and at bedtime.  Marland Kitchen EPINEPHrine (EPIPEN 2-PAK) 0.3 mg/0.3 mL IJ SOAJ injection Inject 0.3 mLs  (0.3 mg total) into the muscle as needed (for allergic reaction).  Marland Kitchen esomeprazole (NEXIUM) 40 MG capsule Take 1 capsule (40 mg total) by mouth daily. Take 1 tab daily (Patient taking differently: Take 40 mg by mouth daily. )  . estradiol (ESTRACE) 0.5 MG tablet Take 1 tablet (0.5 mg total) by mouth daily.  . furosemide (LASIX) 20 MG tablet TAKE 1 TABLET EVERY OTHER DAY (Patient taking differently: Take 20 mg by mouth 3 (three) times a week. )  . linaclotide (LINZESS) 145 MCG CAPS capsule Take 1 capsule by mouth daily as needed.  . meclizine (ANTIVERT) 25 MG tablet TAKE ONE-HALF (1/2) TABLET THREE TIMES A DAY AS NEEDED FOR DIZZINESS OR NAUSEA (Patient taking differently: Take 12.5 mg by mouth 3 (three) times daily as needed for dizziness or nausea. )  . Multiple Vitamin (MULTIVITAMIN WITH MINERALS) TABS Take 1 tablet by mouth daily.  Marland Kitchen zolpidem (AMBIEN) 10 MG tablet Take 1 tablet (10 mg total) by mouth at bedtime as needed for sleep.   No facility-administered encounter medications on file as of 06/21/2019.    Activities of Daily Living In your present state of health, do you have any difficulty performing the following activities: 06/21/2019  Hearing? N  Vision? N  Difficulty concentrating or making decisions? N  Walking or climbing stairs? N  Dressing or bathing? N  Doing errands, shopping? N  Preparing Food and eating ? N  Using the Toilet? N  In the past six months, have you accidently leaked urine? N  Do you have problems with loss of bowel control? N  Managing your Medications? N  Managing your Finances? N  Housekeeping or managing your Housekeeping? N  Some recent data might be hidden    Patient Care Team: Gregor Hams, MD as PCP - General (Family Medicine) Erline Levine, MD as Consulting Physician (Neurosurgery) Croitoru, Dani Gobble, MD as Consulting Physician (Cardiology) Janie Morning,  MD (Gastroenterology)    Assessment:   This is a routine wellness examination for  Adilyn.Physical assessment deferred to PCP.   Exercise Activities and Dietary recommendations Current Exercise Habits: Home exercise routine, Type of exercise: walking, Time (Minutes): 30, Frequency (Times/Week): 7, Weekly Exercise (Minutes/Week): 210, Intensity: Mild, Exercise limited by: None identified Diet Eats a healthy diet of vegetables, fruits and proteins. Breakfast: egg beaters and [place] bacon Lunch: meat and salad Dinner: protein shake      Goals    . Patient Stated     Patient states wants to be able to go out and travel again.        Fall Risk Fall Risk  06/21/2019 05/11/2019 06/15/2018 10/30/2017 08/22/2016  Falls in the past year? 0 0 1 No No  Comment - Emmi Telephone Survey: data to providers prior to load - - -  Number falls in past yr: 0 - - - -  Injury with Fall? 0 - - - -  Risk for fall due to : No Fall Risks - - - -  Follow up Falls prevention discussed - Falls prevention discussed - -   Is the patient's home free of loose throw rugs in walkways, pet beds, electrical cords, etc?   yes      Grab bars in the bathroom? yes      Handrails on the stairs?   no      Adequate lighting?   yes   Depression Screen PHQ 2/9 Scores 06/21/2019 01/15/2019 06/15/2018 10/30/2017  PHQ - 2 Score 0 0 0 0  PHQ- 9 Score - 0 - -     Cognitive Function     6CIT Screen 06/21/2019 06/15/2018 08/22/2016  What Year? 0 points 0 points 0 points  What month? 0 points 0 points 0 points  What time? 0 points 0 points 0 points  Count back from 20 0 points 0 points 0 points  Months in reverse 0 points 0 points 0 points  Repeat phrase 0 points 2 points 0 points  Total Score 0 2 0    Immunization History  Administered Date(s) Administered  . Fluad Quad(high Dose 65+) 03/01/2019  . Influenza, High Dose Seasonal PF 03/25/2018  . Influenza, Seasonal, Injecte, Preservative Fre 03/01/2019  . Influenza-Unspecified 03/02/2015, 03/15/2016, 02/21/2017  . Lyme Disease 03/20/2009  . Pneumococcal  Conjugate-13 08/30/2014  . Pneumococcal Polysaccharide-23 10/10/2011, 06/17/2012  . Tdap 06/17/2010, 04/08/2011  . Zoster 03/22/2012, 06/17/2012    Screening Tests Health Maintenance  Topic Date Due  . MAMMOGRAM  12/23/2020  . TETANUS/TDAP  04/07/2021  . COLONOSCOPY  07/16/2025  . INFLUENZA VACCINE  Completed  . DEXA SCAN  Completed  . Hepatitis C Screening  Completed  . PNA vac Low Risk Adult  Completed       Plan:    Please schedule your next medicare wellness visit with me in 1 yr.  Ms. Gray Bernhardt , Thank you for taking time to come for your Medicare Wellness Visit. I appreciate your ongoing commitment to your health goals. Please review the following plan we discussed and let me know if I can assist you in the future.   These are the goals we discussed: Goals    . Patient Stated     Patient states wants to be able to go out and travel again.        This is a list of the screening recommended for you and due dates:  Health Maintenance  Topic Date Due  .  Mammogram  12/23/2020  . Tetanus Vaccine  04/07/2021  . Colon Cancer Screening  07/16/2025  . Flu Shot  Completed  . DEXA scan (bone density measurement)  Completed  .  Hepatitis C: One time screening is recommended by Center for Disease Control  (CDC) for  adults born from [birth year] through 1964.   Completed  . Pneumonia vaccines  Completed      I have personally reviewed and noted the following in the patient's chart:   . Medical and social history . Use of alcohol, tobacco or illicit drugs  . Current medications and supplements . Functional ability and status . Nutritional status . Physical activity . Advanced directives . List of other physicians . Hospitalizations, surgeries, and ER visits in previous 12 months . Vitals . Screenings to include cognitive, depression, and falls . Referrals and appointments  In addition, I have reviewed and discussed with patient certain preventive protocols, quality  metrics, and best practice recommendations. A written personalized care plan for preventive services as well as general preventive health recommendations were provided to patient.     Joanne Chars, LPN  [id number]

## 2019-06-09 LAB — VITAMIN D 25 HYDROXY (VIT D DEFICIENCY, FRACTURES): Vit D, 25-Hydroxy: 22 ng/mL — ABNORMAL LOW (ref 30–100)

## 2019-06-09 LAB — PTH, INTACT AND CALCIUM
Calcium: 8.7 mg/dL (ref 8.6–10.4)
PTH: 45 pg/mL (ref 14–64)

## 2019-06-09 LAB — PHOSPHORUS: Phosphorus: 3.1 mg/dL (ref 2.1–4.3)

## 2019-06-21 ENCOUNTER — Ambulatory Visit (INDEPENDENT_AMBULATORY_CARE_PROVIDER_SITE_OTHER): Payer: Medicare Other | Admitting: *Deleted

## 2019-06-21 ENCOUNTER — Other Ambulatory Visit: Payer: Self-pay

## 2019-06-21 VITALS — BP 137/50 | HR 72 | Ht 63.0 in | Wt 156.0 lb

## 2019-06-21 DIAGNOSIS — Z0000 Encounter for general adult medical examination without abnormal findings: Secondary | ICD-10-CM | POA: Diagnosis not present

## 2019-06-21 NOTE — Patient Instructions (Signed)
[name] , Thank you for taking time to come for your Medicare Wellness Visit. I appreciate your ongoing commitment to your health goals. Please review the following plan we discussed and let me know if I can assist you in the future.   These are the goals we discussed: Goals    . Patient Stated     Patient states wants to be able to go out and travel again.

## 2019-07-06 ENCOUNTER — Encounter: Payer: Self-pay | Admitting: Family Medicine

## 2019-07-13 ENCOUNTER — Telehealth: Payer: Self-pay

## 2019-07-13 ENCOUNTER — Ambulatory Visit: Payer: Medicare Other

## 2019-07-13 ENCOUNTER — Ambulatory Visit (INDEPENDENT_AMBULATORY_CARE_PROVIDER_SITE_OTHER): Payer: Medicare Other | Admitting: Medical-Surgical

## 2019-07-13 ENCOUNTER — Encounter: Payer: Self-pay | Admitting: Medical-Surgical

## 2019-07-13 VITALS — BP 137/62 | HR 58 | Temp 97.6°F

## 2019-07-13 DIAGNOSIS — Z20822 Contact with and (suspected) exposure to covid-19: Secondary | ICD-10-CM | POA: Diagnosis not present

## 2019-07-13 DIAGNOSIS — B349 Viral infection, unspecified: Secondary | ICD-10-CM

## 2019-07-13 DIAGNOSIS — J329 Chronic sinusitis, unspecified: Secondary | ICD-10-CM

## 2019-07-13 MED ORDER — CEFDINIR 300 MG PO CAPS: 300.0000 mg | ORAL_CAPSULE | Freq: Two times a day (BID) | ORAL | 0 refills | Status: AC

## 2019-07-13 NOTE — Progress Notes (Addendum)
Virtual Visit via telephone Note  I connected with Traci [name] on 07/13/19 at  1:00 PM EST by telephone and verified that I am speaking with the correct person using two identifiers.   I discussed the limitations of evaluation and management by telemedicine and the availability of in person appointments. The patient expressed understanding and agreed to proceed.  Subjective:    CC: upper respiratory symptoms   HPI: Traci [name] is a 74 year old female presenting via telephone with complaints of HA, DOE, fatigue, sinus congestion, left-sided facial pain, ear/eye pain and pressure, rhinorrhea, postnasal drip, chills, fatigue, decreased appetite, and diarrhea x5-6 days.  No sore throat, fever, cough, chest congestion, anosmia, or ageusia. No known sick contacts.  No recent antibiotic use.  Past medical history, Surgical history, Family history not pertinant except as noted below, Social history, Allergies, and medications have been entered into the medical record, reviewed, and corrections made.   Review of Systems: No fevers, night sweats, weight loss, or chest pain.   Objective:    General: Speaking clearly in complete sentences without any shortness of breath.  Alert and oriented x3.  Normal judgment. No apparent acute distress.    Impression and Recommendations:    Viral syndrome Symptoms suspicious for possible COVID-19 infection. Strongly advise COVID testing. Patient has appointment for testing today at 4pm.   Acute Nonrecurrent Sinusitis Given duration/severity of symptoms and increase in facial/sinus pain with bending, will treat empirically with Cefdinir 300 mg twice daily x10 days as patient has previously tolerated well.   Return if symptoms worsen or fail to improve.   I discussed the assessment and treatment plan with the patient. The patient was provided an opportunity to ask questions and all were answered. The patient agreed with the plan and demonstrated an  understanding of the instructions.   The patient was advised to call back or seek an in-person evaluation if the symptoms worsen or if the condition fails to improve as anticipated.  35 minutes of non-face-to-face time was provided during this encounter.  Clearnce Sorrel, DNP, APRN, FNP-BC [facility]

## 2019-07-13 NOTE — Telephone Encounter (Signed)
Shantale called and states she was advised by Dr Madilyn Fireman that she would be her PCP. Did you give ok to be her PCP?

## 2019-07-13 NOTE — Telephone Encounter (Signed)
Added to chart

## 2019-07-13 NOTE — Telephone Encounter (Signed)
There is a patient mychart message stating Dr Madilyn Fireman is ok with being her PCP. I will add to PCP documentation.

## 2019-07-13 NOTE — Telephone Encounter (Signed)
Sending to schedulers

## 2019-07-14 ENCOUNTER — Telehealth: Payer: Self-pay | Admitting: Family Medicine

## 2019-07-14 NOTE — Telephone Encounter (Signed)
Appointment has been made for February. No further questions at this time. Patient states that she took her test yesterday and it was negative. She wanted to make PCP aware of this and that it was in the [facility], she was not sure if you can see that.

## 2019-07-17 ENCOUNTER — Other Ambulatory Visit: Payer: Self-pay | Admitting: Cardiovascular Disease

## 2019-07-22 ENCOUNTER — Ambulatory Visit: Payer: Medicare Other | Attending: Internal Medicine

## 2019-07-22 DIAGNOSIS — Z23 Encounter for immunization: Secondary | ICD-10-CM | POA: Insufficient documentation

## 2019-07-22 NOTE — Progress Notes (Signed)
   Covid-19 Vaccination Clinic  Name:  Traci [name]    MRN: [id number] DOB: 16-Feb-1946  07/22/2019  [name] was observed post Covid-19 immunization for 30 minutes based on pre-vaccination screening without incidence. She was provided with Vaccine Information Sheet and instruction to access the V-Safe system.   [name] was instructed to call 911 with any severe reactions post vaccine: Marland Kitchen Difficulty breathing  . Swelling of your face and throat  . A fast heartbeat  . A bad rash all over your body  . Dizziness and weakness    Immunizations Administered    Name Date Dose VIS Date Route   Pfizer COVID-19 Vaccine 07/22/2019 12:55 PM 0.3 mL 05/28/2019 Intramuscular   Manufacturer: [place]   Lot: [id number]   [place]: [id number]

## 2019-08-03 ENCOUNTER — Ambulatory Visit: Payer: Medicare Other

## 2019-08-03 ENCOUNTER — Telehealth: Payer: Self-pay

## 2019-08-03 NOTE — Telephone Encounter (Signed)
Pt called to verify date of seconf covid shot. Informed pt that her appotnment is 08/16/19 at 4pm at [place]. Pt verbalized understanding.

## 2019-08-05 ENCOUNTER — Ambulatory Visit: Payer: Medicare Other | Admitting: Family Medicine

## 2019-08-10 ENCOUNTER — Other Ambulatory Visit: Payer: Self-pay

## 2019-08-10 ENCOUNTER — Ambulatory Visit (INDEPENDENT_AMBULATORY_CARE_PROVIDER_SITE_OTHER): Payer: Medicare Other | Admitting: Family Medicine

## 2019-08-10 ENCOUNTER — Encounter: Payer: Self-pay | Admitting: Family Medicine

## 2019-08-10 VITALS — BP 127/53 | HR 69 | Ht 63.0 in | Wt 155.0 lb

## 2019-08-10 DIAGNOSIS — G4700 Insomnia, unspecified: Secondary | ICD-10-CM | POA: Diagnosis not present

## 2019-08-10 DIAGNOSIS — N1831 Chronic kidney disease, stage 3a: Secondary | ICD-10-CM

## 2019-08-10 DIAGNOSIS — Z780 Asymptomatic menopausal state: Secondary | ICD-10-CM | POA: Diagnosis not present

## 2019-08-10 DIAGNOSIS — R1013 Epigastric pain: Secondary | ICD-10-CM

## 2019-08-10 DIAGNOSIS — I10 Essential (primary) hypertension: Secondary | ICD-10-CM

## 2019-08-10 NOTE — Progress Notes (Signed)
Established Patient Office Visit  Subjective:  Patient ID: Traci [name], female    DOB: [birth year]  Age: 74 y.o. MRN: [id number]  CC:  Chief Complaint  Patient presents with  . Follow-up    HPI Shahed [place]ut [place] presents for   Hypertension- Pt denies chest pain, SOB, dizziness, or heart palpitations.  Taking meds as directed w/o problems.  Denies medication side effects.  She reports that she tries to exercise regularly and often walks at [employer].  F/u CKD -no recent changes.  F/U insomnia -she uses Ambien for sleep.  Her original prescription is for 10 mg.  But she does use it just occasionally she is not using it regularly.  She was never given anything less than 10 mg she feels like she tolerates it well without any potential side effects or problems.  She is still having some ongoing stomach issues.  She reports that as soon as she eats she feels full immediately.  And then does not really want to eat much for the rest of the day.  She denies any recent increase or change in heartburn type symptoms.  She has had a evaluation including endoscopy and what sounds like an emptying study.  She also has a history of chronic constipation and uses Linzess as needed.  She really tries to stick to a bland diet and drink protein shakes to keep her nutrition up.  She says she is able to maintain her weight with that.  She does not have a gallbladder anymore and has no history of gastric ulcers.  She also has no history of prior pancreatitis.  The evaluation for gastroparesis was probably about a year and a half ago.  She has been tested for H. pylori in the past and was negative.  She does take her PPI daily.  Hormone replacement therapy-she is requesting a refill on her estradiol.  Anxiety-she is also requesting refill on her Tranxene.  She has been on this for quite some time.  He is also had her first Covid vaccine.  Past Medical History:  Diagnosis Date  . Anxiety    . Atrial fibrillation ([place])   . CAD (coronary artery disease)   . Claudication ([place]) 01/06/2008   Lower extremity dopplers - no evidence of arterial insufficiency, normal exam  . Coronary artery disease   . GERD (gastroesophageal reflux disease)   . Hyperlipidemia   . Hypertension 11/30/2010   echo- EF 55%; normal w/ mildly sclerotic aortic valve  . Hypertension 11/05/11   renal dopplers - celiac artery and SMA >50% diameter reduction, R renal artery - mildly elevated velocities 1-59% diameter reduction, L renal artery normal  . Nonspecific ST-T wave electrocardiographic changes 03/26/2011   R/Lmv - EF 74%, normal perfusion all regions, ST depression w/ Lexiscan infusion w/o assoc angina  . Osteopenia   . PAD (peripheral artery disease) ([place])   . Peripheral vascular disease ([place])   . PVD (peripheral vascular disease) ([place]) 11/05/2011   doppler - R/L brachial pressures essentially equal w/o inflow disease; L sublclavian/CCA bypass graft demonstrates patent flow, no evidence of significant stenosis  . Sigmoid diverticulitis     Past Surgical History:  Procedure Laterality Date  . ABDOMINAL AORTIC ANEURYSM REPAIR N/A 01/27/2014   Procedure: AORTO-SUPERIOR MESENTERIC ARTERY BYPASS GRAFT;  Surgeon: Angelia Mould, MD;  Location: [place];  Service: Vascular;  Laterality: N/A;  . ABDOMINAL HYSTERECTOMY    . APPENDECTOMY    . CARDIAC CATHETERIZATION  06/03/2008  60% LAD, involving D2, borderline significant by IVUS, medical therapy. CFX, RCA OK.  Marland Kitchen CATARACT EXTRACTION    . CHOLECYSTECTOMY    . EYE SURGERY     retinal surgery  . SPINE SURGERY  Feb. 2011  . [place]   X's  several  . SUBCLAVIAN ARTERY STENT Left 09/20/2008   LSA ISR 7x30mm Cordis Genesis on Opta premount, reduction from 80% to 0%  . subclavian artery stents  01/23/2010   Left carotid to subclavian artery Bypass  . VISCERAL ANGIOGRAM  01/17/2014   Procedure: VISCERAL ANGIOGRAM;  Surgeon: Angelia Mould, MD;  Location: [facility] CATH LAB;  Service: Cardiovascular;;    Family History  Problem Relation Age of Onset  . Heart disease Father 11       Heart Disease before age 108  . Kidney disease Father   . Heart attack Father   . Hyperlipidemia Father   . Hypertension Father   . Kidney failure Mother   . Diabetes Maternal Grandmother   . Heart disease Paternal Grandfather     Social History   Socioeconomic History  . Marital status: Widowed    Spouse name: Not on file  . Number of children: 1  . Years of education: 79  . Highest education level: Bachelor's degree (e.g., BA, AB, BS)  Occupational History  . Occupation: retired    Comment: Marine scientist  Tobacco Use  . Smoking status: Former Smoker    Packs/day: 0.25    Years: 20.00    Pack years: 5.00    Types: Cigarettes    Quit date: 09/30/1993    Years since quitting: 25.8  . Smokeless tobacco: Never Used  Substance and Sexual Activity  . Alcohol use: No  . Drug use: No  . Sexual activity: Not Currently  Other Topics Concern  . 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 No caffeine use.   Social Determinants of Health   Financial Resource Strain:   . Difficulty of Paying Living Expenses: Not on file  Food Insecurity:   . Worried About Charity fundraiser in the Last Year: Not on file  . Ran Out of Food in the Last Year: Not on file  Transportation Needs:   . Lack of Transportation (Medical): Not on file  . Lack of Transportation (Non-Medical): Not on file  Physical Activity:   . Days of Exercise per Week: Not on file  . Minutes of Exercise per Session: Not on file  Stress:   . Feeling of Stress : Not on file  Social Connections:   . Frequency of Communication with Friends and Family: Not on file  . Frequency of Social Gatherings with Friends and Family: Not on file  . Attends Religious Services: Not on file  . Active Member of Clubs or Organizations: Not on file   . Attends Archivist Meetings: Not on file  . Marital Status: Not on file  Intimate Partner Violence:   . Fear of Current or Ex-Partner: Not on file  . Emotionally Abused: Not on file  . Physically Abused: Not on file  . Sexually Abused: Not on file    Outpatient Medications Prior to Visit  Medication Sig Dispense Refill  . aspirin EC 81 MG tablet Take 81 mg by mouth daily.    Marland Kitchen atenolol (TENORMIN) 25 MG tablet TAKE 1 TABLET DAILY (Patient taking differently: Take 25 mg by mouth  daily. ) 90 tablet 3  . atorvastatin (LIPITOR) 10 MG tablet TAKE 1 TABLET DAILY AT 6 P.M. 90 tablet 1  . candesartan (ATACAND) 16 MG tablet TAKE ONE-HALF (1/2) TABLET DAILY 45 tablet 1  . cholecalciferol (VITAMIN D) 1000 UNITS tablet Take 1,000 Units by mouth daily.    . clopidogrel (PLAVIX) 75 MG tablet TAKE 1 TABLET DAILY 90 tablet 1  . clorazepate (TRANXENE) 7.5 MG tablet Take 1 tablet (7.5 mg total) by mouth daily as needed. (Patient taking differently: Take 7.5 mg by mouth daily as needed for anxiety or sleep. ) 90 tablet 1  . cyanocobalamin 1000 MCG tablet Take 100 mcg by mouth daily.    Marland Kitchen dicyclomine (BENTYL) 10 MG capsule Take 1 capsule (10 mg total) by mouth 4 (four) times daily -  before meals and at bedtime. 90 capsule 0  . EPINEPHrine (EPIPEN 2-PAK) 0.3 mg/0.3 mL IJ SOAJ injection Inject 0.3 mLs (0.3 mg total) into the muscle as needed (for allergic reaction). 2 Device 0  . esomeprazole (NEXIUM) 40 MG capsule Take 1 capsule (40 mg total) by mouth daily. Take 1 tab daily (Patient taking differently: Take 40 mg by mouth daily. ) 90 capsule 0  . estradiol (ESTRACE) 0.5 MG tablet Take 1 tablet (0.5 mg total) by mouth daily. 90 tablet 3  . furosemide (LASIX) 20 MG tablet TAKE 1 TABLET EVERY OTHER DAY (Patient taking differently: Take 20 mg by mouth 3 (three) times a week. ) 45 tablet 6  . linaclotide (LINZESS) 145 MCG CAPS capsule Take 1 capsule by mouth daily as needed.    . meclizine (ANTIVERT)  25 MG tablet TAKE ONE-HALF (1/2) TABLET THREE TIMES A DAY AS NEEDED FOR DIZZINESS OR NAUSEA (Patient taking differently: Take 12.5 mg by mouth 3 (three) times daily as needed for dizziness or nausea. ) 90 tablet 6  . Multiple Vitamin (MULTIVITAMIN WITH MINERALS) TABS Take 1 tablet by mouth daily.    Marland Kitchen zolpidem (AMBIEN) 10 MG tablet Take 1 tablet (10 mg total) by mouth at bedtime as needed for sleep. 90 tablet 1   No facility-administered medications prior to visit.    Allergies  Allergen Reactions  . Cortisone Anaphylaxis  . Dilaudid [Hydromorphone Hcl] Nausea And Vomiting    Pt states she will start vomiting immediately for 6 hours  . Iodine Anaphylaxis  . Medrol [Methylprednisolone] Anaphylaxis  . Omnipaque [Iohexol] Anaphylaxis  . Prednisone Anaphylaxis and Swelling  . Shellfish Allergy Anaphylaxis  . Sulfa Drugs Cross Reactors Anaphylaxis  . Doxycycline     Thrush/itching  . Methylprednisolone Sodium Succ Swelling    Blood pressure drops immediately  . Sulfa Antibiotics Other (See Comments)    Stomach upset  . Augmentin [Amoxicillin-Pot Clavulanate] Nausea Only    Upset stomach (Has taken cephalexin in the past without problems)  . Codeine Rash and Other (See Comments)    Upset stomach  . Erythromycin Rash  . Morphine And Related Nausea Only    nausea    ROS Review of Systems    Objective:    Physical Exam  Constitutional: She is oriented to person, place, and time. She appears well-developed and well-nourished.  HENT:  Head: Normocephalic and atraumatic.  Cardiovascular: Normal rate, regular rhythm and normal heart sounds.  Pulmonary/Chest: Effort normal and breath sounds normal.  Abdominal: Soft. Bowel sounds are normal. She exhibits no distension. There is no abdominal tenderness.  Neurological: She is alert and oriented to person, place, and time.  Skin: Skin is  warm and dry.  Psychiatric: She has a normal mood and affect. Her behavior is normal.    BP (!)  127/53   Pulse 69   Ht 5\' 3"  (1.6 m)   Wt 155 lb (70.3 kg)   SpO2 99%   BMI 27.46 kg/m  Wt Readings from Last 3 Encounters:  08/10/19 155 lb (70.3 kg)  06/21/19 156 lb (70.8 kg)  06/03/19 155 lb (70.3 kg)     There are no preventive care reminders to display for this patient.  There are no preventive care reminders to display for this patient.  Lab Results  Component Value Date   TSH 2.14 07/03/2018   Lab Results  Component Value Date   WBC 5.0 08/10/2019   HGB 12.8 08/10/2019   HCT 38.5 08/10/2019   MCV 89.1 08/10/2019   PLT 359 08/10/2019   Lab Results  Component Value Date   NA 138 08/10/2019   K 4.9 08/10/2019   CO2 29 08/10/2019   GLUCOSE 83 08/10/2019   BUN 17 08/10/2019   CREATININE 1.10 (H) 08/10/2019   BILITOT 0.5 08/10/2019   ALKPHOS 58 02/17/2019   AST 21 08/10/2019   ALT 17 08/10/2019   PROT 7.1 08/10/2019   ALBUMIN 4.3 02/17/2019   CALCIUM 9.1 08/10/2019   ANIONGAP 10 05/30/2019   Lab Results  Component Value Date   CHOL 129 02/17/2019   Lab Results  Component Value Date   HDL 47 02/17/2019   Lab Results  Component Value Date   LDLCALC 53 02/17/2019   Lab Results  Component Value Date   TRIG 178 (H) 02/17/2019   Lab Results  Component Value Date   CHOLHDL 2.7 02/17/2019   No results found for: HGBA1C    Assessment & Plan:   Problem List Items Addressed This Visit      Cardiovascular and Mediastinum   Hypertension - Primary (Chronic)    Well controlled. Continue current regimen. Follow up in  6 months. Will get updated labs.         Genitourinary   CKD (chronic kidney disease) stage 3, GFR 30-59 ml/min    Discussed the need to check renal function twice a year. Will keep NSAIDs to a minimum.         Other   Menopause    She is currently on estradiol.  We will just need to make sure that she gets her yearly mammogram.  Her last one is up-to-date and she had that done in July.      Insomnia    Discussed that the  current dose of her Ambien is higher than what is currently FDA approved for women and especially those over the age of 27.  Just encouraged her to try cutting her Ambien in half and just using 5 mg.  We get ready to refill her medication we will try to switch to the 5 mg tab.  Continue to work on just using it sparingly.      Epigastric pain    Sounds like she has had significant work-up in the past including endoscopy, evaluation for gastroparesis, H. pylori testing etc.  So unfortunately I do not have any great thoughts on what could be causing this she already takes a PPI daily.  I did recommend that we at least rule out acute pancreatitis or elevation in liver enzymes today and will call her with those results once available.  We can always plan to refer her back to GI for  further work-up.      Relevant Orders   COMPLETE METABOLIC PANEL WITH GFR (Completed)   Lipase (Completed)   CBC with Differential/Platelet (Completed)      No orders of the defined types were placed in this encounter.   Follow-up: Return in about 4 months (around 12/08/2019) for Medication follow up.  Beatrice Lecher, MD

## 2019-08-11 ENCOUNTER — Encounter: Payer: Self-pay | Admitting: Family Medicine

## 2019-08-11 DIAGNOSIS — R1013 Epigastric pain: Secondary | ICD-10-CM | POA: Insufficient documentation

## 2019-08-11 LAB — COMPLETE METABOLIC PANEL WITH GFR
AG Ratio: 1.4 (calc) (ref 1.0–2.5)
ALT: 17 U/L (ref 6–29)
AST: 21 U/L (ref 10–35)
Albumin: 4.2 g/dL (ref 3.6–5.1)
Alkaline phosphatase (APISO): 56 U/L (ref 37–153)
BUN/Creatinine Ratio: 15 (calc) (ref 6–22)
BUN: 17 mg/dL (ref 7–25)
CO2: 29 mmol/L (ref 20–32)
Calcium: 9.1 mg/dL (ref 8.6–10.4)
Chloride: 103 mmol/L (ref 98–110)
Creat: 1.1 mg/dL — ABNORMAL HIGH (ref 0.60–0.93)
GFR, Est African American: 58 mL/min/{1.73_m2} — ABNORMAL LOW (ref >=60)
GFR, Est Non African American: 50 mL/min/{1.73_m2} — ABNORMAL LOW (ref >=60)
Globulin: 2.9 g/dL (calc) (ref 1.9–3.7)
Glucose, Bld: 83 mg/dL (ref 65–139)
Potassium: 4.9 mmol/L (ref 3.5–5.3)
Sodium: 138 mmol/L (ref 135–146)
Total Bilirubin: 0.5 mg/dL (ref 0.2–1.2)
Total Protein: 7.1 g/dL (ref 6.1–8.1)

## 2019-08-11 LAB — CBC WITH DIFFERENTIAL/PLATELET
Absolute Monocytes: 445 cells/uL (ref 200–950)
Basophils Absolute: 50 cells/uL (ref 0–200)
Basophils Relative: 1 %
Eosinophils Absolute: 160 cells/uL (ref 15–500)
Eosinophils Relative: 3.2 %
HCT: 38.5 % (ref 35.0–45.0)
Hemoglobin: 12.8 g/dL (ref 11.7–15.5)
Lymphs Abs: 1740 cells/uL (ref 850–3900)
MCH: 29.6 pg (ref 27.0–33.0)
MCHC: 33.2 g/dL (ref 32.0–36.0)
MCV: 89.1 fL (ref 80.0–100.0)
MPV: 9.6 fL (ref 7.5–12.5)
Monocytes Relative: 8.9 %
Neutro Abs: 2605 cells/uL (ref 1500–7800)
Neutrophils Relative %: 52.1 %
Platelets: 359 10*3/uL (ref 140–400)
RBC: 4.32 10*6/uL (ref 3.80–5.10)
RDW: 11.9 % (ref 11.0–15.0)
Total Lymphocyte: 34.8 %
WBC: 5 10*3/uL (ref 3.8–10.8)

## 2019-08-11 LAB — LIPASE: Lipase: 24 U/L (ref 7–60)

## 2019-08-11 NOTE — Assessment & Plan Note (Signed)
Well controlled. Continue current regimen. Follow up in  6 months. Will get updated labs.

## 2019-08-11 NOTE — Assessment & Plan Note (Signed)
Discussed the need to check renal function twice a year. Will keep NSAIDs to a minimum.

## 2019-08-11 NOTE — Assessment & Plan Note (Signed)
She is currently on estradiol.  We will just need to make sure that she gets her yearly mammogram.  Her last one is up-to-date and she had that done in July.

## 2019-08-11 NOTE — Assessment & Plan Note (Signed)
Discussed that the current dose of her Ambien is higher than what is currently FDA approved for women and especially those over the age of 85.  Just encouraged her to try cutting her Ambien in half and just using 5 mg.  We get ready to refill her medication we will try to switch to the 5 mg tab.  Continue to work on just using it sparingly.

## 2019-08-11 NOTE — Assessment & Plan Note (Signed)
Sounds like she has had significant work-up in the past including endoscopy, evaluation for gastroparesis, H. pylori testing etc.  So unfortunately I do not have any great thoughts on what could be causing this she already takes a PPI daily.  I did recommend that we at least rule out acute pancreatitis or elevation in liver enzymes today and will call her with those results once available.  We can always plan to refer her back to GI for further work-up.

## 2019-08-16 ENCOUNTER — Ambulatory Visit: Payer: Medicare Other | Attending: Internal Medicine

## 2019-08-16 DIAGNOSIS — Z23 Encounter for immunization: Secondary | ICD-10-CM | POA: Insufficient documentation

## 2019-08-16 NOTE — Progress Notes (Signed)
   Covid-19 Vaccination Clinic  Name:  Traci [name]    MRN: [id number] DOB: 1946-05-08  08/16/2019  [name] was observed post Covid-19 immunization for 30 minutes based on pre-vaccination screening without incidence. She was provided with Vaccine Information Sheet and instruction to access the V-Safe system.   [name] was instructed to call 911 with any severe reactions post vaccine: Marland Kitchen Difficulty breathing  . Swelling of your face and throat  . A fast heartbeat  . A bad rash all over your body  . Dizziness and weakness    Immunizations Administered    Name Date Dose VIS Date Route   Pfizer COVID-19 Vaccine 08/16/2019  3:39 PM 0.3 mL 05/28/2019 Intramuscular   Manufacturer: [place]   Lot: [id number]   [place]: [id number]

## 2019-08-30 ENCOUNTER — Other Ambulatory Visit: Payer: Self-pay | Admitting: Cardiovascular Disease

## 2019-09-03 ENCOUNTER — Ambulatory Visit (INDEPENDENT_AMBULATORY_CARE_PROVIDER_SITE_OTHER): Payer: Medicare Other

## 2019-09-03 ENCOUNTER — Ambulatory Visit (INDEPENDENT_AMBULATORY_CARE_PROVIDER_SITE_OTHER): Payer: Medicare Other | Admitting: Family Medicine

## 2019-09-03 ENCOUNTER — Other Ambulatory Visit: Payer: Self-pay

## 2019-09-03 ENCOUNTER — Ambulatory Visit: Payer: Medicare Other | Admitting: Family Medicine

## 2019-09-03 ENCOUNTER — Encounter: Payer: Self-pay | Admitting: Family Medicine

## 2019-09-03 VITALS — BP 168/55 | HR 60 | Ht 63.0 in | Wt 151.0 lb

## 2019-09-03 DIAGNOSIS — Z20822 Contact with and (suspected) exposure to covid-19: Secondary | ICD-10-CM | POA: Diagnosis not present

## 2019-09-03 DIAGNOSIS — M549 Dorsalgia, unspecified: Secondary | ICD-10-CM | POA: Diagnosis not present

## 2019-09-03 DIAGNOSIS — R6883 Chills (without fever): Secondary | ICD-10-CM

## 2019-09-03 DIAGNOSIS — R0602 Shortness of breath: Secondary | ICD-10-CM

## 2019-09-03 DIAGNOSIS — R5383 Other fatigue: Secondary | ICD-10-CM | POA: Diagnosis not present

## 2019-09-03 DIAGNOSIS — R0789 Other chest pain: Secondary | ICD-10-CM

## 2019-09-03 DIAGNOSIS — R519 Headache, unspecified: Secondary | ICD-10-CM | POA: Diagnosis not present

## 2019-09-03 DIAGNOSIS — R531 Weakness: Secondary | ICD-10-CM | POA: Diagnosis not present

## 2019-09-03 DIAGNOSIS — H6120 Impacted cerumen, unspecified ear: Secondary | ICD-10-CM | POA: Insufficient documentation

## 2019-09-03 DIAGNOSIS — R609 Edema, unspecified: Secondary | ICD-10-CM | POA: Insufficient documentation

## 2019-09-03 LAB — COMPLETE METABOLIC PANEL WITH GFR
AG Ratio: 1.5 (calc) (ref 1.0–2.5)
ALT: 21 U/L (ref 6–29)
AST: 26 U/L (ref 10–35)
Albumin: 4.7 g/dL (ref 3.6–5.1)
Alkaline phosphatase (APISO): 57 U/L (ref 37–153)
BUN/Creatinine Ratio: 13 (calc) (ref 6–22)
BUN: 14 mg/dL (ref 7–25)
CO2: 29 mmol/L (ref 20–32)
Calcium: 9.5 mg/dL (ref 8.6–10.4)
Chloride: 103 mmol/L (ref 98–110)
Creat: 1.05 mg/dL — ABNORMAL HIGH (ref 0.60–0.93)
GFR, Est African American: 61 mL/min/{1.73_m2} (ref >=60)
GFR, Est Non African American: 53 mL/min/{1.73_m2} — ABNORMAL LOW (ref >=60)
Globulin: 3.1 g/dL (calc) (ref 1.9–3.7)
Glucose, Bld: 112 mg/dL — ABNORMAL HIGH (ref 65–99)
Potassium: 4.4 mmol/L (ref 3.5–5.3)
Sodium: 143 mmol/L (ref 135–146)
Total Bilirubin: 0.6 mg/dL (ref 0.2–1.2)
Total Protein: 7.8 g/dL (ref 6.1–8.1)

## 2019-09-03 LAB — CBC WITH DIFFERENTIAL/PLATELET
Absolute Monocytes: 342 cells/uL (ref 200–950)
Basophils Absolute: 52 cells/uL (ref 0–200)
Basophils Relative: 0.9 %
Eosinophils Absolute: 93 cells/uL (ref 15–500)
Eosinophils Relative: 1.6 %
HCT: 41.4 % (ref 35.0–45.0)
Hemoglobin: 13.6 g/dL (ref 11.7–15.5)
Lymphs Abs: 1682 cells/uL (ref 850–3900)
MCH: 29.1 pg (ref 27.0–33.0)
MCHC: 32.9 g/dL (ref 32.0–36.0)
MCV: 88.7 fL (ref 80.0–100.0)
MPV: 9.2 fL (ref 7.5–12.5)
Monocytes Relative: 5.9 %
Neutro Abs: 3631 cells/uL (ref 1500–7800)
Neutrophils Relative %: 62.6 %
Platelets: 360 10*3/uL (ref 140–400)
RBC: 4.67 10*6/uL (ref 3.80–5.10)
RDW: 12.2 % (ref 11.0–15.0)
Total Lymphocyte: 29 %
WBC: 5.8 10*3/uL (ref 3.8–10.8)

## 2019-09-03 LAB — D-DIMER, QUANTITATIVE: D-Dimer, Quant: 0.33 mcg/mL FEU (ref <0.50)

## 2019-09-03 LAB — TSH: TSH: 1.38 mIU/L (ref 0.40–4.50)

## 2019-09-03 LAB — TROPONIN I: Troponin I: <0.01 ng/mL (ref <=0.0)

## 2019-09-03 NOTE — Progress Notes (Signed)
She report that she was seen at [facility] today and was tested for COVID, strep and flu. All of her results were negative. She stated that she was advised to follow up with her pcp.  She reports that she has had chills since Saturday. Blurred vision and a headache that comes and goes.

## 2019-09-03 NOTE — Progress Notes (Signed)
g   Established Patient Office Visit  Subjective:  Patient ID: Traci [name], female    DOB: 07/21/1945  Age: 74 y.o. MRN: [id number]  CC:  Chief Complaint  Patient presents with  . Follow-up    HPI Traci [name] presents for feeling cold to her bones, blurry vision that started on Saturday, about 8 days. She has felt fatigued and had headaches.    Went to UC and was tested for COVID, flu, and strep throat.  And was negative.  Had her last Pfizer vaccine at the beginning of the month.    She did have her second Covid vaccine about 2 weeks ago.  She said she is just had moments where she is felt really weak to the point that her legs felt like she almost could not stand on them but then would feel fine a little later.  She is also had some shortness of breath coming and going.  She is also had some chest pain earlier this week but says it was short-lived and then resolved.  She is also had a few heart "flip-flops" but says that is not atypical for her she does have them usually in the evenings and that has happened in this last week.  She denies any upper respiratory symptoms such as sore throat nasal congestion or cough.  She has been using Tylenol for the pain she is also had a lot of upper back pain just above her bra strap area little bit more to the left and mid back area.  He says through all of this she has never run a fever.  Past Medical History:  Diagnosis Date  . Anxiety   . Atrial fibrillation ([place])   . CAD (coronary artery disease)   . Claudication ([place]) 01/06/2008   Lower extremity dopplers - no evidence of arterial insufficiency, normal exam  . Coronary artery disease   . GERD (gastroesophageal reflux disease)   . Hyperlipidemia   . Hypertension 11/30/2010   echo- EF 55%; normal w/ mildly sclerotic aortic valve  . Hypertension 11/05/11   renal dopplers - celiac artery and SMA >50% diameter reduction, R renal artery - mildly elevated velocities 1-59%  diameter reduction, L renal artery normal  . Nonspecific ST-T wave electrocardiographic changes 03/26/2011   R/Lmv - EF 74%, normal perfusion all regions, ST depression w/ Lexiscan infusion w/o assoc angina  . Osteopenia   . PAD (peripheral artery disease) ([place])   . Peripheral vascular disease ([place])   . PVD (peripheral vascular disease) ([place]) 11/05/2011   doppler - R/L brachial pressures essentially equal w/o inflow disease; L sublclavian/CCA bypass graft demonstrates patent flow, no evidence of significant stenosis  . Sigmoid diverticulitis     Past Surgical History:  Procedure Laterality Date  . ABDOMINAL AORTIC ANEURYSM REPAIR N/A 01/27/2014   Procedure: AORTO-SUPERIOR MESENTERIC ARTERY BYPASS GRAFT;  Surgeon: Angelia Mould, MD;  Location: [facility];  Service: Vascular;  Laterality: N/A;  . ABDOMINAL HYSTERECTOMY    . APPENDECTOMY    . CARDIAC CATHETERIZATION  06/03/2008   60% LAD, involving D2, borderline significant by IVUS, medical therapy. CFX, RCA OK.  Marland Kitchen CATARACT EXTRACTION    . CHOLECYSTECTOMY    . EYE SURGERY     retinal surgery  . SPINE SURGERY  Feb. 2011  . [place]   X's  several  . SUBCLAVIAN ARTERY STENT Left 09/20/2008   LSA ISR 7x71mm Cordis Genesis on Opta premount, reduction from 80% to  0%  . subclavian artery stents  01/23/2010   Left carotid to subclavian artery Bypass  . VISCERAL ANGIOGRAM  01/17/2014   Procedure: VISCERAL ANGIOGRAM;  Surgeon: Angelia Mould, MD;  Location: [facility] CATH LAB;  Service: Cardiovascular;;    Family History  Problem Relation Age of Onset  . Heart disease Father 17       Heart Disease before age 101  . Kidney disease Father   . Heart attack Father   . Hyperlipidemia Father   . Hypertension Father   . Kidney failure Mother   . Diabetes Maternal Grandmother   . Heart disease Paternal Grandfather     Social History   Socioeconomic History  . Marital status: Widowed    Spouse name: Not on file  . Number  of children: 1  . Years of education: 82  . Highest education level: Bachelor's degree (e.g., BA, AB, BS)  Occupational History  . Occupation: retired    Comment: Marine scientist  Tobacco Use  . Smoking status: Former Smoker    Packs/day: 0.25    Years: 20.00    Pack years: 5.00    Types: Cigarettes    Quit date: 09/30/1993    Years since quitting: 25.9  . Smokeless tobacco: Never Used  Substance and Sexual Activity  . Alcohol use: No  . Drug use: No  . Sexual activity: Not Currently  Other Topics Concern  . 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 No caffeine use.   Social Determinants of Health   Financial Resource Strain:   . Difficulty of Paying Living Expenses:   Food Insecurity:   . Worried About Charity fundraiser in the Last Year:   . Arboriculturist in the Last Year:   Transportation Needs:   . Film/video editor (Medical):   Marland Kitchen Lack of Transportation (Non-Medical):   Physical Activity:   . Days of Exercise per Week:   . Minutes of Exercise per Session:   Stress:   . Feeling of Stress :   Social Connections:   . Frequency of Communication with Friends and Family:   . Frequency of Social Gatherings with Friends and Family:   . Attends Religious Services:   . Active Member of Clubs or Organizations:   . Attends Archivist Meetings:   Marland Kitchen Marital Status:   Intimate Partner Violence:   . Fear of Current or Ex-Partner:   . Emotionally Abused:   Marland Kitchen Physically Abused:   . Sexually Abused:     Outpatient Medications Prior to Visit  Medication Sig Dispense Refill  . aspirin EC 81 MG tablet Take 81 mg by mouth daily.    Marland Kitchen atenolol (TENORMIN) 25 MG tablet TAKE 1 TABLET DAILY (Patient taking differently: Take 25 mg by mouth daily. ) 90 tablet 3  . atorvastatin (LIPITOR) 10 MG tablet TAKE 1 TABLET DAILY AT 6 P.M. 90 tablet 1  . candesartan (ATACAND) 16 MG tablet TAKE ONE-HALF (1/2) TABLET DAILY 45 tablet 1  .  cholecalciferol (VITAMIN D) 1000 UNITS tablet Take 1,000 Units by mouth daily.    . clopidogrel (PLAVIX) 75 MG tablet TAKE 1 TABLET DAILY 90 tablet 1  . clorazepate (TRANXENE) 7.5 MG tablet Take 1 tablet (7.5 mg total) by mouth daily as needed. (Patient taking differently: Take 7.5 mg by mouth daily as needed for anxiety or sleep. ) 90 tablet 1  . cyanocobalamin 1000 MCG tablet  Take 100 mcg by mouth daily.    Marland Kitchen dicyclomine (BENTYL) 10 MG capsule Take 1 capsule (10 mg total) by mouth 4 (four) times daily -  before meals and at bedtime. 90 capsule 0  . EPINEPHrine (EPIPEN 2-PAK) 0.3 mg/0.3 mL IJ SOAJ injection Inject 0.3 mLs (0.3 mg total) into the muscle as needed (for allergic reaction). 2 Device 0  . esomeprazole (NEXIUM) 40 MG capsule Take 1 capsule (40 mg total) by mouth daily. Take 1 tab daily (Patient taking differently: Take 40 mg by mouth daily. ) 90 capsule 0  . estradiol (ESTRACE) 0.5 MG tablet Take 1 tablet (0.5 mg total) by mouth daily. 90 tablet 3  . furosemide (LASIX) 20 MG tablet TAKE 1 TABLET EVERY OTHER DAY 45 tablet 3  . linaclotide (LINZESS) 145 MCG CAPS capsule Take 1 capsule by mouth daily as needed.    . meclizine (ANTIVERT) 25 MG tablet TAKE ONE-HALF (1/2) TABLET THREE TIMES A DAY AS NEEDED FOR DIZZINESS OR NAUSEA (Patient taking differently: Take 12.5 mg by mouth 3 (three) times daily as needed for dizziness or nausea. ) 90 tablet 6  . Multiple Vitamin (MULTIVITAMIN WITH MINERALS) TABS Take 1 tablet by mouth daily.    Marland Kitchen zolpidem (AMBIEN) 10 MG tablet Take 1 tablet (10 mg total) by mouth at bedtime as needed for sleep. 90 tablet 1   No facility-administered medications prior to visit.    Allergies  Allergen Reactions  . Cortisone Anaphylaxis  . Dilaudid [Hydromorphone Hcl] Nausea And Vomiting    Pt states she will start vomiting immediately for 6 hours  . Iodine Anaphylaxis  . Medrol [Methylprednisolone] Anaphylaxis  . Omnipaque [Iohexol] Anaphylaxis  . Prednisone  Anaphylaxis and Swelling  . Shellfish Allergy Anaphylaxis  . Sulfa Drugs Cross Reactors Anaphylaxis  . Doxycycline     Thrush/itching  . Methylprednisolone Sodium Succ Swelling    Blood pressure drops immediately  . Sulfa Antibiotics Other (See Comments)    Stomach upset  . Augmentin [Amoxicillin-Pot Clavulanate] Nausea Only    Upset stomach (Has taken cephalexin in the past without problems)  . Codeine Rash and Other (See Comments)    Upset stomach  . Erythromycin Rash  . Morphine And Related Nausea Only    nausea    ROS Review of Systems    Objective:    Physical Exam  Constitutional: She is oriented to person, place, and time. She appears well-developed and well-nourished.  HENT:  Head: Normocephalic and atraumatic.  Right Ear: External ear normal.  Left Ear: External ear normal.  Nose: Nose normal.  Mouth/Throat: Oropharynx is clear and moist.  Left TM and canal is clear.  Right tympanic membrane is blocked by cerumen.  Eyes: Pupils are equal, round, and reactive to light. Conjunctivae and EOM are normal.  Neck: No thyromegaly present.  Cardiovascular: Normal rate, regular rhythm and normal heart sounds.  Pulmonary/Chest: Effort normal and breath sounds normal. She has no wheezes.  Abdominal: Soft. Bowel sounds are normal. She exhibits no distension and no mass. There is no abdominal tenderness. There is no rebound and no guarding.  Musculoskeletal:        General: No edema.     Cervical back: Neck supple.  Lymphadenopathy:    She has no cervical adenopathy.  Neurological: She is alert and oriented to person, place, and time.  Skin: Skin is warm and dry.  Psychiatric: She has a normal mood and affect. Her behavior is normal.    BP (!) 168/55  Pulse 60   Ht 5\' 3"  (1.6 m)   Wt 151 lb (68.5 kg)   SpO2 100%   BMI 26.75 kg/m  Wt Readings from Last 3 Encounters:  09/03/19 151 lb (68.5 kg)  08/10/19 155 lb (70.3 kg)  06/21/19 156 lb (70.8 kg)     There are  no preventive care reminders to display for this patient.  There are no preventive care reminders to display for this patient.  Lab Results  Component Value Date   TSH 2.14 07/03/2018   Lab Results  Component Value Date   WBC 5.0 08/10/2019   HGB 12.8 08/10/2019   HCT 38.5 08/10/2019   MCV 89.1 08/10/2019   PLT 359 08/10/2019   Lab Results  Component Value Date   NA 138 08/10/2019   K 4.9 08/10/2019   CO2 29 08/10/2019   GLUCOSE 83 08/10/2019   BUN 17 08/10/2019   CREATININE 1.10 (H) 08/10/2019   BILITOT 0.5 08/10/2019   ALKPHOS 58 02/17/2019   AST 21 08/10/2019   ALT 17 08/10/2019   PROT 7.1 08/10/2019   ALBUMIN 4.3 02/17/2019   CALCIUM 9.1 08/10/2019   ANIONGAP 10 05/30/2019   Lab Results  Component Value Date   CHOL 129 02/17/2019   Lab Results  Component Value Date   HDL 47 02/17/2019   Lab Results  Component Value Date   LDLCALC 53 02/17/2019   Lab Results  Component Value Date   TRIG 178 (H) 02/17/2019   Lab Results  Component Value Date   CHOLHDL 2.7 02/17/2019   No results found for: HGBA1C    Assessment & Plan:   Problem List Items Addressed This Visit    None    Visit Diagnoses    Atypical chest pain    -  Primary   Relevant Orders   COMPLETE METABOLIC PANEL WITH GFR   TSH   CBC with Differential/Platelet   D-dimer, quantitative (not at [facility])   Troponin I   DG Chest 2 View   Upper back pain       Relevant Orders   COMPLETE METABOLIC PANEL WITH GFR   TSH   CBC with Differential/Platelet   D-dimer, quantitative (not at [facility])   Troponin I   SOB (shortness of breath)       Relevant Orders   COMPLETE METABOLIC PANEL WITH GFR   TSH   CBC with Differential/Platelet   D-dimer, quantitative (not at [facility])   Troponin I   DG Chest 2 View   Weakness       Relevant Orders   COMPLETE METABOLIC PANEL WITH GFR   TSH   CBC with Differential/Platelet   D-dimer, quantitative (not at [facility])   Troponin I   Chills (without fever)        Relevant Orders   COMPLETE METABOLIC PANEL WITH GFR   TSH   CBC with Differential/Platelet   D-dimer, quantitative (not at [facility])   Troponin I     Atypical chest pain-unclear etiology.  I am concerned though because she does have significant history of atherosclerosis and peripheral vascular disease.  And the chest pain was earlier this week and she has not had it in a couple days but she is also been having some upper back pain but again is not having it today.  EKG shows rate of 57 bpm, sinus rhythm with no acute ST-T wave changes.  No significant change from previous EKG on file except for maybe lead V3.  We  will get additional chest x-ray and labs today to evaluate for underlying cardiac problem.  We will also check a D-dimer she is never had a prior history of DVT or pulmonary embolism but has been having intermittent shortness of breath.  Chills-unclear etiology it sounds like she has not actually been running a fever which is reassuring and she did test negative for flu, Covid and strep throat today.  No orders of the defined types were placed in this encounter.   Follow-up: Return if symptoms worsen or fail to improve.    Beatrice Lecher, MD

## 2019-09-06 ENCOUNTER — Encounter: Payer: Self-pay | Admitting: Family Medicine

## 2019-09-06 NOTE — Telephone Encounter (Signed)
Forwarding to provider.

## 2019-09-09 ENCOUNTER — Emergency Department (HOSPITAL_COMMUNITY)
Admission: EM | Admit: 2019-09-09 | Discharge: 2019-09-09 | Disposition: A | Discharge status: Home / Self Care | Payer: Medicare Other | Attending: Emergency Medicine | Admitting: Emergency Medicine

## 2019-09-09 ENCOUNTER — Emergency Department (HOSPITAL_BASED_OUTPATIENT_CLINIC_OR_DEPARTMENT_OTHER): Payer: Medicare Other

## 2019-09-09 ENCOUNTER — Encounter (HOSPITAL_COMMUNITY): Payer: Self-pay

## 2019-09-09 ENCOUNTER — Other Ambulatory Visit: Payer: Self-pay

## 2019-09-09 ENCOUNTER — Emergency Department (HOSPITAL_COMMUNITY): Payer: Medicare Other

## 2019-09-09 DIAGNOSIS — Z79899 Other long term (current) drug therapy: Secondary | ICD-10-CM | POA: Diagnosis not present

## 2019-09-09 DIAGNOSIS — R109 Unspecified abdominal pain: Secondary | ICD-10-CM | POA: Diagnosis not present

## 2019-09-09 DIAGNOSIS — R079 Chest pain, unspecified: Secondary | ICD-10-CM | POA: Diagnosis not present

## 2019-09-09 DIAGNOSIS — I129 Hypertensive chronic kidney disease with stage 1 through stage 4 chronic kidney disease, or unspecified chronic kidney disease: Secondary | ICD-10-CM | POA: Insufficient documentation

## 2019-09-09 DIAGNOSIS — Z7982 Long term (current) use of aspirin: Secondary | ICD-10-CM | POA: Diagnosis not present

## 2019-09-09 DIAGNOSIS — R0789 Other chest pain: Secondary | ICD-10-CM | POA: Insufficient documentation

## 2019-09-09 DIAGNOSIS — N183 Chronic kidney disease, stage 3 unspecified: Secondary | ICD-10-CM | POA: Insufficient documentation

## 2019-09-09 DIAGNOSIS — R52 Pain, unspecified: Secondary | ICD-10-CM | POA: Diagnosis not present

## 2019-09-09 DIAGNOSIS — Z87891 Personal history of nicotine dependence: Secondary | ICD-10-CM | POA: Diagnosis not present

## 2019-09-09 DIAGNOSIS — I4891 Unspecified atrial fibrillation: Secondary | ICD-10-CM | POA: Diagnosis not present

## 2019-09-09 DIAGNOSIS — I10 Essential (primary) hypertension: Secondary | ICD-10-CM | POA: Diagnosis not present

## 2019-09-09 DIAGNOSIS — I2510 Atherosclerotic heart disease of native coronary artery without angina pectoris: Secondary | ICD-10-CM | POA: Diagnosis not present

## 2019-09-09 LAB — BASIC METABOLIC PANEL
Anion gap: 10 (ref 5–15)
BUN: 17 mg/dL (ref 8–23)
CO2: 25 mmol/L (ref 22–32)
Calcium: 8.5 mg/dL — ABNORMAL LOW (ref 8.9–10.3)
Chloride: 105 mmol/L (ref 98–111)
Creatinine, Ser: 0.83 mg/dL (ref 0.44–1.00)
GFR calc Af Amer: >60 mL/min (ref >60)
GFR calc non Af Amer: >60 mL/min (ref >60)
Glucose, Bld: 113 mg/dL — ABNORMAL HIGH (ref 70–99)
Potassium: 3.9 mmol/L (ref 3.5–5.1)
Sodium: 140 mmol/L (ref 135–145)

## 2019-09-09 LAB — CBC
HCT: 37.5 % (ref 36.0–46.0)
Hemoglobin: 12.3 g/dL (ref 12.0–15.0)
MCH: 29.9 pg (ref 26.0–34.0)
MCHC: 32.8 g/dL (ref 30.0–36.0)
MCV: 91 fL (ref 80.0–100.0)
Platelets: 294 10*3/uL (ref 150–400)
RBC: 4.12 MIL/uL (ref 3.87–5.11)
RDW: 11.9 % (ref 11.5–15.5)
WBC: 5.3 10*3/uL (ref 4.0–10.5)
nRBC: 0 % (ref 0.0–0.2)

## 2019-09-09 LAB — TROPONIN I (HIGH SENSITIVITY)
Troponin I (High Sensitivity): 4 ng/L (ref <18)
Troponin I (High Sensitivity): 3 ng/L (ref <18)

## 2019-09-09 LAB — LACTIC ACID, PLASMA
Lactic Acid, Venous: 2.1 mmol/L (ref 0.5–1.9)
Lactic Acid, Venous: 1.7 mmol/L (ref 0.5–1.9)

## 2019-09-09 MED ORDER — NITROGLYCERIN 2 % TD OINT
1.0000 [in_us] | TOPICAL_OINTMENT | Freq: Once | TRANSDERMAL | Status: AC
  Administered 2019-09-09: 1 [in_us] via TOPICAL
  Filled 2019-09-09: qty 1

## 2019-09-09 NOTE — ED Notes (Signed)
Per Dr. Patrick Jupiter fluids provided, nitro patch removed. Will monitor tolerance for PO fluids.

## 2019-09-09 NOTE — ED Provider Notes (Signed)
[facility] EMERGENCY DEPARTMENT Provider Note   CSN: [id number] Arrival date & time: 09/09/19  [id number]     History Chief Complaint  Patient presents with  . Chest Pain    Traci [name] is a 74 y.o. female.  HPI     This is a 74 year old female with a history of coronary artery disease, hypertension, hyperlipidemia, peripheral artery disease with subclavian stenting and bypass as well as a mesenteric bypass who presents with chest pain.  Patient reports that she woke up from sleep this morning with sharp left-sided chest pain.  It is not exertional in nature.  Nonpleuritic.  Denies any recent fevers or cough.  Initially pain was 8 out of 10.  She was given nitroglycerin and now it is 5 out of 10.  She has extensive peripheral vascular disease.  She had nonobstructive coronary artery disease and has had repeat functional testing which has been reassuring.  Patient denies any lower extremity swelling or history of blood clots.  She is on Plavix.  Patient denies any weakness, numbness, tingling in the upper or lower extremities.  See summary below from last cardiology note Patient is followed by Dr. Recardo Evangelist and Dr. Scot Dock  Traci [name] has long-standing history of peripheral arterial disease with previous stents and surgical revascularization of the left subclavian artery for subclavian steal syndrome and presyncope. She eventually underwent a left carotid to subclavian bypass.  She underwent a bypass from the aorta to the chronically totally occluded superficial mesenteric artery due to symptoms of chronic mesenteric ischemia, (Dr. Scot Dock August 2015 6 mm Dacron graft) She had moderate CAD by coronary angiography performed in 2009. Normal left ventricular systolic function and no perfusion abnormalities by echo 2015 and nuclear stress test performed in 2014 and  October 2017. She had carotid duplex ultrasonography last performed in March 2019, without  evidence of significant obstruction. She has a left carotid-subclavian bypass graft without evidence of stenosis. The aortic SMA bypass was noted on abdominal CT performed in July 2018, although no comment was made about its patency. There was no evidence of aortic aneurysm and no comment made about the renal arteries.  Past Medical History:  Diagnosis Date  . Anxiety   . Atrial fibrillation ([place])   . CAD (coronary artery disease)   . Claudication ([place]) 01/06/2008   Lower extremity dopplers - no evidence of arterial insufficiency, normal exam  . Coronary artery disease   . GERD (gastroesophageal reflux disease)   . Hyperlipidemia   . Hypertension 11/30/2010   echo- EF 55%; normal w/ mildly sclerotic aortic valve  . Hypertension 11/05/11   renal dopplers - celiac artery and SMA >50% diameter reduction, R renal artery - mildly elevated velocities 1-59% diameter reduction, L renal artery normal  . Nonspecific ST-T wave electrocardiographic changes 03/26/2011   R/Lmv - EF 74%, normal perfusion all regions, ST depression w/ Lexiscan infusion w/o assoc angina  . Osteopenia   . PAD (peripheral artery disease) ([place])   . Peripheral vascular disease ([place])   . PVD (peripheral vascular disease) ([place]) 11/05/2011   doppler - R/L brachial pressures essentially equal w/o inflow disease; L sublclavian/CCA bypass graft demonstrates patent flow, no evidence of significant stenosis  . Sigmoid diverticulitis     Patient Active Problem List   Diagnosis Date Noted  . Cerumen debris on tympanic membrane 09/03/2019  . Edema 09/03/2019  . Epigastric pain 08/11/2019  . Insomnia 08/10/2019  . Hypocalcemia 06/03/2019  . Atherosclerosis of aorta ([place])  01/04/2018  . Osteoporosis 12/25/2017  . CKD (chronic kidney disease) stage 3, GFR 30-59 ml/min 08/04/2017  . Primary osteoarthritis of first carpometacarpal joint of left hand 07/10/2017  . Strain of right Achilles tendon 04/14/2017  . Atypical nevus 08/22/2016  .  Acute bilateral low back pain with left-sided sciatica 06/06/2016  . Osteopenia 12/13/2015  . Menopause 12/08/2015  . History of colonoscopy 07/17/2015  . Irregular heartbeat 07/26/2014  . 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[place]) 01/16/2014  . Irritable bowel syndrome 09/21/2013  . Hyperlipidemia 04/05/2013  . Hypertension 12/06/2012  . Peripheral arterial disease ([place]) 12/06/2012  . CAD, cath 2009 12/06/2012  . Hyponatremia, improved holding diuretic 12/06/2012  . Lymphocele of left arm 07/14/2012  . Atherosclerosis of other specified arteries 04/02/2012  . 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 Stricture of artery ([place]) 10/02/2011  . Allergic dermatitis 06/09/2011    Past Surgical History:  Procedure Laterality Date  . ABDOMINAL AORTIC ANEURYSM REPAIR N/A 01/27/2014   Procedure: AORTO-SUPERIOR MESENTERIC ARTERY BYPASS GRAFT;  Surgeon: Angelia Mould, MD;  Location: [place];  Service: Vascular;  Laterality: N/A;  . ABDOMINAL HYSTERECTOMY    . APPENDECTOMY    . CARDIAC CATHETERIZATION  06/03/2008   60% LAD, involving D2, borderline significant by IVUS, medical therapy. CFX, RCA OK.  Marland Kitchen CATARACT EXTRACTION    . CHOLECYSTECTOMY    . EYE SURGERY     retinal surgery  . SPINE SURGERY  Feb. 2011  . [place]   X's  several  . SUBCLAVIAN ARTERY STENT Left 09/20/2008   LSA ISR 7x54mm Cordis Genesis on Opta premount, reduction from 80% to 0%  . subclavian artery stents  01/23/2010   Left carotid to subclavian artery Bypass  . VISCERAL ANGIOGRAM  01/17/2014   Procedure: VISCERAL ANGIOGRAM;  Surgeon: Angelia Mould, MD;  Location: [facility] CATH LAB;  Service: Cardiovascular;;     OB History   No obstetric history on file.     Family History  Problem Relation Age of Onset  . Heart disease Father 29       Heart Disease before age 60  . Kidney disease Father   . Heart attack Father   . Hyperlipidemia Father   . Hypertension  Father   . Kidney failure Mother   . Diabetes Maternal Grandmother   . Heart disease Paternal Grandfather     Social History   Tobacco Use  . Smoking status: Former Smoker    Packs/day: 0.25    Years: 20.00    Pack years: 5.00    Types: Cigarettes    Quit date: 09/30/1993    Years since quitting: 25.9  . Smokeless tobacco: Never Used  Substance Use Topics  . Alcohol use: No  . Drug use: No    Home Medications Prior to Admission medications   Medication Sig Start Date End Date Taking? Authorizing Provider  aspirin EC 81 MG tablet Take 81 mg by mouth daily.   Yes [provider]  atenolol (TENORMIN) 25 MG tablet TAKE 1 TABLET DAILY Patient taking differently: Take 25 mg by mouth daily.  03/02/19  Yes Croitoru, Mihai, MD  atorvastatin (LIPITOR) 10 MG tablet TAKE 1 TABLET DAILY AT 6 P.M. Patient taking differently: Take 10 mg by mouth every evening.  07/19/19  Yes Croitoru, Mihai, MD  candesartan (ATACAND) 16 MG tablet TAKE ONE-HALF (1/2) TABLET DAILY Patient taking differently: Take 8 mg by mouth daily.  07/19/19  Yes  Croitoru, Mihai, MD  cholecalciferol (VITAMIN D) 1000 UNITS tablet Take 1,000 Units by mouth daily.   Yes [provider]  clopidogrel (PLAVIX) 75 MG tablet TAKE 1 TABLET DAILY 07/19/19  Yes Croitoru, Mihai, MD  clorazepate (TRANXENE) 7.5 MG tablet Take 1 tablet (7.5 mg total) by mouth daily as needed. Patient taking differently: Take 7.5 mg by mouth daily.  04/06/19  Yes Gregor Hams, MD  cyanocobalamin 1000 MCG tablet Take 100 mcg by mouth daily.   Yes [provider]  dicyclomine (BENTYL) 10 MG capsule Take 1 capsule (10 mg total) by mouth 4 (four) times daily -  before meals and at bedtime. 03/29/19  Yes Gregor Hams, MD  EPINEPHrine (EPIPEN 2-PAK) 0.3 mg/0.3 mL IJ SOAJ injection Inject 0.3 mLs (0.3 mg total) into the muscle as needed (for allergic reaction). 12/23/17  Yes Gregor Hams, MD  esomeprazole (NEXIUM) 40 MG capsule Take 1 capsule  (40 mg total) by mouth daily. Take 1 tab daily Patient taking differently: Take 40 mg by mouth daily.  02/23/15  Yes Croitoru, Mihai, MD  estradiol (ESTRACE) 0.5 MG tablet Take 1 tablet (0.5 mg total) by mouth daily. 03/29/19  Yes Gregor Hams, MD  furosemide (LASIX) 20 MG tablet TAKE 1 TABLET EVERY OTHER DAY 08/31/19  Yes Croitoru, Mihai, MD  linaclotide (LINZESS) 145 MCG CAPS capsule Take 1 capsule by mouth daily as needed (constipation).  12/22/15  Yes [provider]  meclizine (ANTIVERT) 25 MG tablet TAKE ONE-HALF (1/2) TABLET THREE TIMES A DAY AS NEEDED FOR DIZZINESS OR NAUSEA Patient taking differently: Take 12.5 mg by mouth 3 (three) times daily as needed for dizziness or nausea.  03/29/19  Yes Gregor Hams, MD  Multiple Vitamin (MULTIVITAMIN WITH MINERALS) TABS Take 1 tablet by mouth daily.   Yes [provider]  zolpidem (AMBIEN) 10 MG tablet Take 1 tablet (10 mg total) by mouth at bedtime as needed for sleep. 04/06/19  Yes Gregor Hams, MD    Allergies    Cortisone, Dilaudid [hydromorphone hcl], Iodine, Medrol [methylprednisolone], Omnipaque [iohexol], Prednisone, Shellfish allergy, Sulfa drugs cross reactors, Doxycycline, Methylprednisolone sodium succ, Sulfa antibiotics, Augmentin [amoxicillin-pot clavulanate], Codeine, Erythromycin, and Morphine and related  Review of Systems   Review of Systems  Constitutional: Negative for fever.  Respiratory: Negative for cough and shortness of breath.   Cardiovascular: Positive for chest pain. Negative for leg swelling.  Gastrointestinal: Negative for abdominal pain, nausea and vomiting.  Genitourinary: Negative for dysuria.  Neurological: Negative for weakness and numbness.  All other systems reviewed and are negative.   Physical Exam Updated Vital Signs BP (!) 142/61   Pulse (!) 58   Temp 97.7 F (36.5 C) (Oral)   Resp 18   Ht 1.6 m (5\' 3" )   Wt 68.5 kg   SpO2 98%   BMI 26.75 kg/m   Physical Exam Vitals and  nursing note reviewed.  Constitutional:      Appearance: She is well-developed.     Comments: Chronically ill-appearing, nontoxic  HENT:     Head: Normocephalic and atraumatic.  Eyes:     Pupils: Pupils are equal, round, and reactive to light.  Cardiovascular:     Rate and Rhythm: Normal rate and regular rhythm.     Heart sounds: Normal heart sounds.     Comments: 1+ pulse pedal bilaterally Pulmonary:     Effort: Pulmonary effort is normal. No respiratory distress.     Breath sounds: No wheezing.  Abdominal:     General: Bowel sounds are normal.     Palpations: Abdomen is soft.  Musculoskeletal:     Cervical back: Neck supple.     Right lower leg: No edema.     Left lower leg: No edema.  Skin:    General: Skin is warm and dry.  Neurological:     Mental Status: She is alert and oriented to person, place, and time.  Psychiatric:        Mood and Affect: Mood normal.     ED Results / Procedures / Treatments   Labs (all labs ordered are listed, but only abnormal results are displayed) Labs Reviewed  CBC  BASIC METABOLIC PANEL  TROPONIN I (HIGH SENSITIVITY)    EKG EKG Interpretation  Date/Time:  Thursday September 09 2019 06:14:06 EDT Ventricular Rate:  62 PR Interval:    QRS Duration: 94 QT Interval:  439 QTC Calculation: 446 R Axis:   133 Text Interpretation: Sinus or ectopic atrial rhythm Right axis deviation Nonspecific T abnormalities, lateral leads No significant change since last tracing Confirmed by Thayer Jew [phone]) on 09/09/2019 6:28:39 AM   Radiology DG Chest 2 View  Result Date: 09/09/2019 CLINICAL DATA:  Chest pain. EXAM: CHEST - 2 VIEW COMPARISON:  Chest x-ray 09/03/2019. FINDINGS: Surgical clips noted over the left apex. Vascular stent noted over the left upper chest in unchanged position. Mediastinum hilar structures normal. Heart size stable. Mild bibasilar subsegmental atelectasis. No pleural effusion or pneumothorax IMPRESSION: Mild bibasilar  subsegmental atelectasis.  Chest is otherwise stable. Electronically Signed   By: Marcello Moores  Register   On: 09/09/2019 06:42    Procedures Procedures (including critical care time)  Medications Ordered in ED Medications  nitroGLYCERIN (NITROGLYN) 2 % ointment 1 inch (1 inch Topical Given 09/09/19 [phone])    ED Course  I have reviewed the triage vital signs and the nursing notes.  Pertinent labs & imaging results that were available during my care of the patient were reviewed by me and considered in my medical decision making (see chart for details).  Clinical Course as of Sep 08 725  Thu Sep 09, 2019  0720 Pt signed out to me by dr Dina Rich.  Briefly 74 yo female w/ subclavian stent, vascular disease (non cardiac) presenting to the ED with acute sharp chest pain that woke her up from sleep.  Has frequent episodes of CP.  Pending a troponin rule out   [MT]    Clinical Course User Index [MT] Trifan, Carola Rhine, MD   MDM Rules/Calculators/A&P                       Patient presents with chest pain. She is overall nontoxic-appearing. Blood pressure 142/60. Pulse 58. Some features of her pain are atypical. I have reviewed her chart. She had a similar presentation in December. EKG shows no signs of acute ischemia. Chest x-ray without mediastinal widening and stable from prior. Have low suspicion for dissection at this time. Patient has a significant contrast allergy and has not been able to have CT angios in the past. Will follow her clinically and do serial troponins. Patient's pain treated and she was given Nitropaste. Patient signed out to Dr. Langston Masker.    Final Clinical Impression(s) / ED Diagnoses Final diagnoses:  None    Rx / DC Orders ED Discharge Orders    None       Dywane Peruski, Barbette Hair, MD 09/09/19 [phone]

## 2019-09-09 NOTE — ED Provider Notes (Addendum)
Clinical Course as of Sep 09 1750  Thu Sep 09, 2019  0720 Pt signed out to me by dr Dina Rich.  Briefly 74 yo female w/ subclavian stent, vascular disease (non cardiac) presenting to the ED with acute sharp chest pain that woke her up from sleep.  Has frequent episodes of CP.  Pending a troponin rule out   [MT]  0823 Initial ecg with limb lead reversal, after fixing leads R wave now upright in lateral leads, similar to prior   [MT]  0827 Pain has improved after 4/10 from 10/10 she reported at its onset   [MT]  0841 Vasc surgeon Dr Donzetta Matters recommending mesenteric duplex ultrasound, keep NPO for now.    [MT]  1412 Her pain is down to 3 out of 10. She wants food and water.  Discussed with brandon cain md who reports if stent is open and lactate improved, no further recommendations from vascular perspective.  She can f/u in office.  I offered her pain medications but she declined.   [MT]    Clinical Course User Index [MT] Wyvonnia Dusky, MD   With patient's pain improving, and no worsening lactate level, this seems less likely mesenteric ischemia or acute intraabdominal emergency.  Likewise, I have a low suspicion for pneumonia clinically.  I do not suspect PE, as she has no hypoxia, no tachycardia, no tachypnea, and she reports having had similar symptoms in the past, which is inconsistent with PE.   Wyvonnia Dusky, MD 09/09/19 1752    Wyvonnia Dusky, MD 09/09/19 1754

## 2019-09-09 NOTE — ED Triage Notes (Signed)
Pt came in [place] from home, with CP that woke her up form her sleep. Pt stated it was a "Stabbing Pain, that went to her shoulder blade". Pt is A/Ox4.Pt has an extensive cardiac hx.  108/66, 67HR, 97%RA 1Nitro Given with CP relief from a 10 to a 5.  324ASA given in route 18GIV LAC

## 2019-09-09 NOTE — Discharge Instructions (Signed)
Your workup done today was reassuring in the emergency department.  Specifically we did not see any evidence of a heart attack, blockage of your stent, pneumonia, or infection.  Unfortunately, we could not provide you with an exact diagnosis for your pain, but this is not uncommon in the emergency department.  You told me your pain had improved, and that you did not want pain medications for home.  I advised you to return to the ER if your pain worsens or returns.

## 2019-09-09 NOTE — Progress Notes (Signed)
Mesenteric duplex  has been completed. Refer to [facility] under chart review to view preliminary results.   09/09/2019  12:43 PM Graham Hyun, Bonnye Fava

## 2019-09-09 NOTE — Consult Note (Signed)
ED Consult    Reason for Consult:  Abdominal pain Referring Physician:  Dr. Langston Masker MRN #:  [id number]  History of Present Illness:  74 y.o. female has history of left carotid subclavian bypass.  Also has aorta to superior mesenteric artery bypass in 2015.  She was last seen by Dr. Doren Custard last summer.  She is on aspirin, Plavix and statin.  She presented to the emergency department with chest pain radiating to her back and left shoulder.  She was evaluated ultrasound.  She has a severe iodine allergy cannot get CT scan.  Currently pain has resolved.  Past Medical History:  Diagnosis Date  . Anxiety   . Atrial fibrillation ([place])   . CAD (coronary artery disease)   . Claudication ([place]) 01/06/2008   Lower extremity dopplers - no evidence of arterial insufficiency, normal exam  . Coronary artery disease   . GERD (gastroesophageal reflux disease)   . Hyperlipidemia   . Hypertension 11/30/2010   echo- EF 55%; normal w/ mildly sclerotic aortic valve  . Hypertension 11/05/11   renal dopplers - celiac artery and SMA >50% diameter reduction, R renal artery - mildly elevated velocities 1-59% diameter reduction, L renal artery normal  . Nonspecific ST-T wave electrocardiographic changes 03/26/2011   R/Lmv - EF 74%, normal perfusion all regions, ST depression w/ Lexiscan infusion w/o assoc angina  . Osteopenia   . PAD (peripheral artery disease) ([place])   . Peripheral vascular disease ([place])   . PVD (peripheral vascular disease) ([place]) 11/05/2011   doppler - R/L brachial pressures essentially equal w/o inflow disease; L sublclavian/CCA bypass graft demonstrates patent flow, no evidence of significant stenosis  . Sigmoid diverticulitis     Past Surgical History:  Procedure Laterality Date  . ABDOMINAL AORTIC ANEURYSM REPAIR N/A 01/27/2014   Procedure: AORTO-SUPERIOR MESENTERIC ARTERY BYPASS GRAFT;  Surgeon: Angelia Mould, MD;  Location: [place];  Service: Vascular;  Laterality: N/A;  . ABDOMINAL  HYSTERECTOMY    . APPENDECTOMY    . CARDIAC CATHETERIZATION  06/03/2008   60% LAD, involving D2, borderline significant by IVUS, medical therapy. CFX, RCA OK.  Marland Kitchen CATARACT EXTRACTION    . CHOLECYSTECTOMY    . EYE SURGERY     retinal surgery  . SPINE SURGERY  Feb. 2011  . [place]   X's  several  . SUBCLAVIAN ARTERY STENT Left 09/20/2008   LSA ISR 7x1mm Cordis Genesis on Opta premount, reduction from 80% to 0%  . subclavian artery stents  01/23/2010   Left carotid to subclavian artery Bypass  . VISCERAL ANGIOGRAM  01/17/2014   Procedure: VISCERAL ANGIOGRAM;  Surgeon: Angelia Mould, MD;  Location[facility] C[facility] LAB;  Service: Cardiovascular;;    Allergies  Allergen Reactions  . Cortisone Anaphylaxis  . Dilaudid [Hydromorphone Hcl] Nausea And Vomiting    Pt states she will start vomiting immediately for 6 hours  . Iodine Anaphylaxis  . Medrol [Methylprednisolone] Anaphylaxis  . Omnipaque [Iohexol] Anaphylaxis  . Prednisone Anaphylaxis and Swelling  . Shellfish Allergy Anaphylaxis  . Sulfa Drugs Cross Reactors Anaphylaxis  . Doxycycline     Thrush/itching  . Methylprednisolone Sodium Succ Swelling    Blood pressure drops immediately  . Sulfa Antibiotics Other (See Comments)    Stomach upset  . Augmentin [Amoxicillin-Pot Clavulanate] Nausea Only    Upset stomach (Has taken cephalexin in the past without problems)  . Codeine Rash and Other (See Comments)    Upset stomach  . Erythromycin Rash  .  Morphine And Related Nausea Only    nausea    Prior to Admission medications   Medication Sig Start Date End Date Taking? Authorizing Provider  aspirin EC 81 MG tablet Take 81 mg by mouth daily.   Yes [provider]  atenolol (TENORMIN) 25 MG tablet TAKE 1 TABLET DAILY Patient taking differently: Take 25 mg by mouth daily.  03/02/19  Yes Croitoru, Mihai, MD  atorvastatin (LIPITOR) 10 MG tablet TAKE 1 TABLET DAILY AT 6 P.M. Patient taking differently: Take  10 mg by mouth every evening.  07/19/19  Yes Croitoru, Mihai, MD  candesartan (ATACAND) 16 MG tablet TAKE ONE-HALF (1/2) TABLET DAILY Patient taking differently: Take 8 mg by mouth daily.  07/19/19  Yes Croitoru, Mihai, MD  cholecalciferol (VITAMIN D) 1000 UNITS tablet Take 1,000 Units by mouth daily.   Yes [provider]  clopidogrel (PLAVIX) 75 MG tablet TAKE 1 TABLET DAILY 07/19/19  Yes Croitoru, Mihai, MD  clorazepate (TRANXENE) 7.5 MG tablet Take 1 tablet (7.5 mg total) by mouth daily as needed. Patient taking differently: Take 7.5 mg by mouth daily.  04/06/19  Yes Gregor Hams, MD  cyanocobalamin 1000 MCG tablet Take 100 mcg by mouth daily.   Yes [provider]  dicyclomine (BENTYL) 10 MG capsule Take 1 capsule (10 mg total) by mouth 4 (four) times daily -  before meals and at bedtime. 03/29/19  Yes Gregor Hams, MD  EPINEPHrine (EPIPEN 2-PAK) 0.3 mg/0.3 mL IJ SOAJ injection Inject 0.3 mLs (0.3 mg total) into the muscle as needed (for allergic reaction). 12/23/17  Yes Gregor Hams, MD  esomeprazole (NEXIUM) 40 MG capsule Take 1 capsule (40 mg total) by mouth daily. Take 1 tab daily Patient taking differently: Take 40 mg by mouth daily.  02/23/15  Yes Croitoru, Mihai, MD  estradiol (ESTRACE) 0.5 MG tablet Take 1 tablet (0.5 mg total) by mouth daily. 03/29/19  Yes Gregor Hams, MD  furosemide (LASIX) 20 MG tablet TAKE 1 TABLET EVERY OTHER DAY 08/31/19  Yes Croitoru, Mihai, MD  linaclotide (LINZESS) 145 MCG CAPS capsule Take 1 capsule by mouth daily as needed (constipation).  12/22/15  Yes [provider]  meclizine (ANTIVERT) 25 MG tablet TAKE ONE-HALF (1/2) TABLET THREE TIMES A DAY AS NEEDED FOR DIZZINESS OR NAUSEA Patient taking differently: Take 12.5 mg by mouth 3 (three) times daily as needed for dizziness or nausea.  03/29/19  Yes Gregor Hams, MD  Multiple Vitamin (MULTIVITAMIN WITH MINERALS) TABS Take 1 tablet by mouth daily.   Yes [provider]  zolpidem  (AMBIEN) 10 MG tablet Take 1 tablet (10 mg total) by mouth at bedtime as needed for sleep. 04/06/19  Yes Gregor Hams, MD    Social History   Socioeconomic History  . Marital status: Widowed    Spouse name: Not on file  . Number of children: 1  . Years of education: 73  . Highest education level: Bachelor's degree (e.g., BA, AB, BS)  Occupational History  . Occupation: retired    Comment: Marine scientist  Tobacco Use  . Smoking status: Former Smoker    Packs/day: 0.25    Years: 20.00    Pack years: 5.00    Types: Cigarettes    Quit date: 09/30/1993    Years since quitting: 25.9  . Smokeless tobacco: Never Used  Substance and Sexual Activity  . Alcohol use: No  . Drug use: No  . Sexual activity: Not Currently  Other Topics Concern  .  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 No caffeine use.   Social Determinants of Health   Financial Resource Strain:   . Difficulty of Paying Living Expenses:   Food Insecurity:   . Worried About Charity fundraiser in the Last Year:   . Arboriculturist in the Last Year:   Transportation Needs:   . Film/video editor (Medical):   Marland Kitchen Lack of Transportation (Non-Medical):   Physical Activity:   . Days of Exercise per Week:   . Minutes of Exercise per Session:   Stress:   . Feeling of Stress :   Social Connections:   . Frequency of Communication with Friends and Family:   . Frequency of Social Gatherings with Friends and Family:   . Attends Religious Services:   . Active Member of Clubs or Organizations:   . Attends Archivist Meetings:   Marland Kitchen Marital Status:   Intimate Partner Violence:   . Fear of Current or Ex-Partner:   . Emotionally Abused:   Marland Kitchen Physically Abused:   . Sexually Abused:     Family History  Problem Relation Age of Onset  . Heart disease Father 58       Heart Disease before age 46  . Kidney disease Father   . Heart attack Father   . Hyperlipidemia Father    . Hypertension Father   . Kidney failure Mother   . Diabetes Maternal Grandmother   . Heart disease Paternal Grandfather     ROS:  Cardiovascular: [x]  chest pain/pressure []  palpitations []  SOB lying flat []  DOE []  pain in legs while walking []  pain in legs at rest []  pain in legs at night []  non-healing ulcers []  hx of DVT []  swelling in legs  Pulmonary: []  productive cough []  asthma/wheezing []  home O2  Neurologic: []  weakness in []  arms []  legs []  numbness in []  arms []  legs []  hx of CVA []  mini stroke [] difficulty speaking or slurred speech []  temporary loss of vision in one eye []  dizziness  Hematologic: []  hx of cancer []  bleeding problems []  problems with blood clotting easily  Endocrine:   []  diabetes []  thyroid disease  GI []  vomiting blood []  blood in stool  GU: []  CKD/renal failure []  HD--[]  M/W/F or []  T/T/S []  burning with urination []  blood in urine  Psychiatric: []  anxiety []  depression  Musculoskeletal: []  arthritis []  joint pain  Integumentary: []  rashes []  ulcers  Constitutional: []  fever []  chills   Physical Examination  Vitals:   09/09/19 1330 09/09/19 1415  BP: (!) 116/57 (!) 144/57  Pulse: (!) 59 60  Resp: 17 18  Temp:    SpO2: 97% 96%   Body mass index is 26.75 kg/m.  General:  nad HENT: WNL, normocephalic Pulmonary: normal non-labored breathing Cardiac: palpable left radial pulse, palpable pedal pulses Abdomen:  soft, NT/ND, no masses Extremities: warm and well perfused Musculoskeletal: no muscle wasting or atrophy  Neurologic: A&O X 3; Appropriate Affect ; SENSATION: normal; MOTOR FUNCTION:  moving all extremities equally. Speech is fluent/normal   CBC    Component Value Date/Time   WBC 5.3 09/09/2019 0615   RBC 4.12 09/09/2019 0615   HGB 12.3 09/09/2019 0615   HCT 37.5 09/09/2019 0615   PLT 294 09/09/2019 0615   MCV 91.0 09/09/2019 0615   MCH 29.9 09/09/2019 0615   MCHC 32.8 09/09/2019 0615     RDW  11.9 09/09/2019 0615   LYMPHSABS 1,682 09/03/2019 1632   MONOABS 0.5 05/30/2019 0110   EOSABS 93 09/03/2019 1632   BASOSABS 52 09/03/2019 1632    BMET    Component Value Date/Time   NA 140 09/09/2019 0615   NA 136 02/17/2019 1234   K 3.9 09/09/2019 0615   CL 105 09/09/2019 0615   CO2 25 09/09/2019 0615   GLUCOSE 113 (H) 09/09/2019 0615   BUN 17 09/09/2019 0615   BUN 15 02/17/2019 1234   CREATININE 0.83 09/09/2019 0615   CREATININE 1.05 (H) 09/03/2019 1632   CALCIUM 8.5 (L) 09/09/2019 0615   GFRNONAA >60 09/09/2019 0615   GFRNONAA 53 (L) 09/03/2019 1632   GFRAA >60 09/09/2019 0615   GFRAA 61 09/03/2019 1632    COAGS: Lab Results  Component Value Date   INR 1.0 05/30/2019   INR 1.18 01/27/2014   INR 1.02 01/25/2014     Non-Invasive Vascular Imaging:   Mesenteric duplex:    Patent aorta. SMA bypass graft appears patent.    ASSESSMENT/PLAN:  74 y.o. female with above-noted procedure.  She has a palpable left radial pulse.  Her abdomen does not appear tender.  Plan per Dr. Scot Dock was to have her follow-up with mesenteric duplex that was performed today.  I will repeat this study in 6 months.  He was going to follow her left carotid subclavian bypass in 2 more years.     Denece Shearer C. Donzetta Matters, MD [facility] Office: [phone] Pager: [phone]

## 2019-09-10 NOTE — Telephone Encounter (Signed)
Routing to provider  

## 2019-09-21 ENCOUNTER — Encounter: Payer: Self-pay | Admitting: Nurse Practitioner

## 2019-09-21 ENCOUNTER — Ambulatory Visit (INDEPENDENT_AMBULATORY_CARE_PROVIDER_SITE_OTHER): Payer: Medicare Other | Admitting: Nurse Practitioner

## 2019-09-21 ENCOUNTER — Other Ambulatory Visit: Payer: Self-pay

## 2019-09-21 VITALS — BP 151/82 | HR 61 | Temp 97.6°F | Ht 63.0 in | Wt 156.0 lb

## 2019-09-21 DIAGNOSIS — R1012 Left upper quadrant pain: Secondary | ICD-10-CM | POA: Diagnosis not present

## 2019-09-21 DIAGNOSIS — R1013 Epigastric pain: Secondary | ICD-10-CM | POA: Diagnosis not present

## 2019-09-21 DIAGNOSIS — N3090 Cystitis, unspecified without hematuria: Secondary | ICD-10-CM | POA: Diagnosis not present

## 2019-09-21 DIAGNOSIS — R1011 Right upper quadrant pain: Secondary | ICD-10-CM | POA: Diagnosis not present

## 2019-09-21 DIAGNOSIS — R3915 Urgency of urination: Secondary | ICD-10-CM | POA: Diagnosis not present

## 2019-09-21 LAB — POCT URINALYSIS DIP (CLINITEK)
Bilirubin, UA: NEGATIVE
Blood, UA: NEGATIVE
Glucose, UA: NEGATIVE mg/dL
Ketones, POC UA: NEGATIVE mg/dL
Nitrite, UA: NEGATIVE
POC PROTEIN,UA: NEGATIVE
Spec Grav, UA: 1.02 (ref 1.010–1.025)
Urobilinogen, UA: 1 E.U./dL
pH, UA: 6.5 (ref 5.0–8.0)

## 2019-09-21 MED ORDER — ONDANSETRON 8 MG PO TBDP: 8.0000 mg | ORAL_TABLET | Freq: Three times a day (TID) | ORAL | 3 refills | Status: DC | PRN

## 2019-09-21 MED ORDER — NITROFURANTOIN MONOHYD MACRO 100 MG PO CAPS: 100.0000 mg | ORAL_CAPSULE | Freq: Two times a day (BID) | ORAL | 0 refills | Status: DC

## 2019-09-21 NOTE — Patient Instructions (Signed)
Increase omeprazole to 40mg  TWICE a day for 7 days.   We are going to check your pancreatic enzyme today and a blood count to see if there is any indication of inflammation or infection at this time.   We will touch base once the results are in and let you know if we need to move forward with imaging or if we need to look at other options.   Start with a bland diet for at least the rest of the day.    Gastritis, Adult Gastritis is inflammation of the stomach. There are two kinds of gastritis:  Acute gastritis. This kind develops suddenly.  Chronic gastritis. This kind is much more common and lasts for a long time. Gastritis happens when the lining of the stomach becomes weak or gets damaged. Without treatment, gastritis can lead to stomach bleeding and ulcers. What are the causes? This condition may be caused by:  An infection.  Drinking too much alcohol.  Certain medicines. These include steroids, antibiotics, and some over-the-counter medicines, such as aspirin or ibuprofen.  Having too much acid in the stomach.  A disease of the intestines or stomach.  Stress.  An allergic reaction.  Crohn's disease.  Some cancer treatments (radiation). Sometimes the cause of this condition is not known. What are the signs or symptoms? Symptoms of this condition include:  Pain or a burning sensation in the upper abdomen.  Nausea.  Vomiting.  An uncomfortable feeling of fullness after eating.  Weight loss.  Bad breath.  Blood in your vomit or stools. In some cases, there are no symptoms. How is this diagnosed? This condition may be diagnosed with:  Your medical history and a description of your symptoms.  A physical exam.  Tests. These can include: ? Blood tests. ? Stool tests. ? A test in which a thin, flexible instrument with a light and a camera is passed down the esophagus and into the stomach (upper endoscopy). ? A test in which a sample of tissue is taken for  testing (biopsy). How is this treated? This condition may be treated with medicines. The medicines that are used vary depending on the cause of the gastritis:  If the condition is caused by a bacterial infection, you may be given antibiotic medicines.  If the condition is caused by too much acid in the stomach, you may be given medicines called H2 blockers, proton pump inhibitors, or antacids. Treatment may also involve stopping the use of certain medicines, such as aspirin, ibuprofen, or other NSAIDs. Follow these instructions at home: Medicines  Take over-the-counter and prescription medicines only as told by your health care provider.  If you were prescribed an antibiotic medicine, take it as told by your health care provider. Do not stop taking the antibiotic even if you start to feel better. Eating and drinking   Eat small, frequent meals instead of large meals.  Avoid foods and drinks that make your symptoms worse.  Drink enough fluid to keep your urine pale yellow. Alcohol use  Do not drink alcohol if: ? Your health care provider tells you not to drink. ? You are pregnant, may be pregnant, or are planning to become pregnant.  If you drink alcohol: ? Limit your use to:  0-1 drink a day for women.  0-2 drinks a day for men. ? Be aware of how much alcohol is in your drink. In the [place] one drink equals one 12 oz bottle of beer (355 mL), one 5 oz glass  of wine (148 mL), or one 1 oz glass of hard liquor (44 mL). General instructions  Talk with your health care provider about ways to manage stress, such as getting regular exercise or practicing deep breathing, meditation, or yoga.  Do not use any products that contain nicotine or tobacco, such as cigarettes and e-cigarettes. If you need help quitting, ask your health care provider.  Keep all follow-up visits as told by your health care provider. This is important. Contact a health care provider if:  Your symptoms get  worse.  Your symptoms return after treatment. Get help right away if:  You vomit blood or material that looks like coffee grounds.  You have black or dark red stools.  You are unable to keep fluids down.  Your abdominal pain gets worse.  You have a fever.  You do not feel better after one week. Summary  Gastritis is inflammation of the lining of the stomach that can occur suddenly (acute) or develop slowly over time (chronic).  This condition is diagnosed with a medical history, a physical exam, or tests.  This condition may be treated with medicines to treat infection or medicines to reduce the amount of acid in your stomach.  Follow your health care provider's instructions about taking medicines, making changes to your diet, and knowing when to call for help. This information is not intended to replace advice given to you by your health care provider. Make sure you discuss any questions you have with your health care provider. Document Revised: 10/21/2017 Document Reviewed: 10/21/2017 Elsevier Patient Education  [place].

## 2019-09-21 NOTE — Progress Notes (Signed)
Acute Office Visit  Subjective:    Patient ID: Traci [name], female    DOB: 1945-07-08, 74 y.o.   MRN: [id number]  No chief complaint on file.   HPI Patient is in today for abdominal pain that has been ongoing for several weeks. She reports she first experienced the pain on 09/09/19 at 12/10 on the pain scale located directly in the center of her chest and described as "stabbing" pain. She went to the ED via EMS where she had labs, CXR, EKG, and abdominal x-ray which were unremarkable. She was told to follow-up with cardiology and GI and was discharged without admission. She reports her pain subsided until today when she began experiencing the same pain, however, now located directly under her left breast with radiation around to the left side and down towards her intestines. She reports the pain stops between the LUQ and LLQ. The pain is intermittent and about 9/10 on the pain scale. This time she is also experiencing nausea without vomiting with anything taken PO. She also reports symptoms of weakness in her legs and arms with a "heavy" sensation.  She reports laying on her Right side seems to increase the pain and laying on her left side makes it better. She takes Nexium 40mg  PO daily and gas-x nightly. She reports neither have helped her symptoms. She reports that she was able to hold down about 6 saltine crackers and a small bottle of water so far today.  She endorses left sided chest pain radiating down the left side toward the abdomen, nausea, weakness, night sweats, normal BM (last one on Sunday), and extra gas. She denies fever, chills, diarrhea, new food, known sick contacts, dark or blood stool, vomiting. She has never had this pain before.    She also reports lower urinary tract symptoms of increased frequency and burning during urination.   She has never had a kidney stone.  She has never had pancreatitis. She has never had gastritis.   Past Medical History:  Diagnosis  Date  . Anxiety   . Atrial fibrillation ([place])   . CAD (coronary artery disease)   . Claudication ([place]) 01/06/2008   Lower extremity dopplers - no evidence of arterial insufficiency, normal exam  . Coronary artery disease   . GERD (gastroesophageal reflux disease)   . Hyperlipidemia   . Hypertension 11/30/2010   echo- EF 55%; normal w/ mildly sclerotic aortic valve  . Hypertension 11/05/11   renal dopplers - celiac artery and SMA >50% diameter reduction, R renal artery - mildly elevated velocities 1-59% diameter reduction, L renal artery normal  . Nonspecific ST-T wave electrocardiographic changes 03/26/2011   R/Lmv - EF 74%, normal perfusion all regions, ST depression w/ Lexiscan infusion w/o assoc angina  . Osteopenia   . PAD (peripheral artery disease) ([place])   . Peripheral vascular disease ([place])   . PVD (peripheral vascular disease) ([place]) 11/05/2011   doppler - R/L brachial pressures essentially equal w/o inflow disease; L sublclavian/CCA bypass graft demonstrates patent flow, no evidence of significant stenosis  . Sigmoid diverticulitis     Past Surgical History:  Procedure Laterality Date  . ABDOMINAL AORTIC ANEURYSM REPAIR N/A 01/27/2014   Procedure: AORTO-SUPERIOR MESENTERIC ARTERY BYPASS GRAFT;  Surgeon: Angelia Mould, MD;  Location: [facility];  Service: Vascular;  Laterality: N/A;  . ABDOMINAL HYSTERECTOMY    . APPENDECTOMY    . CARDIAC CATHETERIZATION  06/03/2008   60% LAD, involving D2, borderline significant by IVUS, medical therapy. CFX, RCA  OK.  . CATARACT EXTRACTION    . CHOLECYSTECTOMY    . EYE SURGERY     retinal surgery  . SPINE SURGERY  Feb. 2011  . [place]   X's  several  . SUBCLAVIAN ARTERY STENT Left 09/20/2008   LSA ISR 7x21mm Cordis Genesis on Opta premount, reduction from 80% to 0%  . subclavian artery stents  01/23/2010   Left carotid to subclavian artery Bypass  . VISCERAL ANGIOGRAM  01/17/2014   Procedure: VISCERAL ANGIOGRAM;   Surgeon: Angelia Mould, MD;  Location[facility] CATH LAB;  Service: Cardiovascular;;    Family History  Problem Relation Age of Onset  . Heart disease Father 45       Heart Disease before age 49  . Kidney disease Father   . Heart attack Father   . Hyperlipidemia Father   . Hypertension Father   . Kidney failure Mother   . Diabetes Maternal Grandmother   . Heart disease Paternal Grandfather     Social History   Socioeconomic History  . Marital status: Widowed    Spouse name: Not on file  . Number of children: 1  . Years of education: 55  . Highest education level: Bachelor's degree (e.g., BA, AB, BS)  Occupational History  . Occupation: retired    Comment: Marine scientist  Tobacco Use  . Smoking status: Former Smoker    Packs/day: 0.25    Years: 20.00    Pack years: 5.00    Types: Cigarettes    Quit date: 09/30/1993    Years since quitting: 25.9  . Smokeless tobacco: Never Used  Substance and Sexual Activity  . Alcohol use: No  . Drug use: No  . Sexual activity: Not Currently  Other Topics Concern  . 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 No caffeine use.   Social Determinants of Health   Financial Resource Strain:   . Difficulty of Paying Living Expenses:   Food Insecurity:   . Worried About Charity fundraiser in the Last Year:   . Arboriculturist in the Last Year:   Transportation Needs:   . Film/video editor (Medical):   Marland Kitchen Lack of Transportation (Non-Medical):   Physical Activity:   . Days of Exercise per Week:   . Minutes of Exercise per Session:   Stress:   . Feeling of Stress :   Social Connections:   . Frequency of Communication with Friends and Family:   . Frequency of Social Gatherings with Friends and Family:   . Attends Religious Services:   . Active Member of Clubs or Organizations:   . Attends Archivist Meetings:   Marland Kitchen Marital Status:   Intimate Partner Violence:   . Fear of  Current or Ex-Partner:   . Emotionally Abused:   Marland Kitchen Physically Abused:   . Sexually Abused:     Outpatient Medications Prior to Visit  Medication Sig Dispense Refill  . aspirin EC 81 MG tablet Take 81 mg by mouth daily.    Marland Kitchen atenolol (TENORMIN) 25 MG tablet TAKE 1 TABLET DAILY (Patient taking differently: Take 25 mg by mouth daily. ) 90 tablet 3  . atorvastatin (LIPITOR) 10 MG tablet TAKE 1 TABLET DAILY AT 6 P.M. (Patient taking differently: Take 10 mg by mouth every evening. ) 90 tablet 1  . candesartan (ATACAND) 16 MG tablet TAKE ONE-HALF (1/2) TABLET DAILY (Patient taking differently:  Take 8 mg by mouth daily. ) 45 tablet 1  . cholecalciferol (VITAMIN D) 1000 UNITS tablet Take 1,000 Units by mouth daily.    . clopidogrel (PLAVIX) 75 MG tablet TAKE 1 TABLET DAILY 90 tablet 1  . clorazepate (TRANXENE) 7.5 MG tablet Take 1 tablet (7.5 mg total) by mouth daily as needed. (Patient taking differently: Take 7.5 mg by mouth daily. ) 90 tablet 1  . cyanocobalamin 1000 MCG tablet Take 100 mcg by mouth daily.    Marland Kitchen dicyclomine (BENTYL) 10 MG capsule Take 1 capsule (10 mg total) by mouth 4 (four) times daily -  before meals and at bedtime. 90 capsule 0  . EPINEPHrine (EPIPEN 2-PAK) 0.3 mg/0.3 mL IJ SOAJ injection Inject 0.3 mLs (0.3 mg total) into the muscle as needed (for allergic reaction). 2 Device 0  . esomeprazole (NEXIUM) 40 MG capsule Take 1 capsule (40 mg total) by mouth daily. Take 1 tab daily (Patient taking differently: Take 40 mg by mouth daily. ) 90 capsule 0  . estradiol (ESTRACE) 0.5 MG tablet Take 1 tablet (0.5 mg total) by mouth daily. 90 tablet 3  . furosemide (LASIX) 20 MG tablet TAKE 1 TABLET EVERY OTHER DAY 45 tablet 3  . linaclotide (LINZESS) 145 MCG CAPS capsule Take 1 capsule by mouth daily as needed (constipation).     . meclizine (ANTIVERT) 25 MG tablet TAKE ONE-HALF (1/2) TABLET THREE TIMES A DAY AS NEEDED FOR DIZZINESS OR NAUSEA (Patient taking differently: Take 12.5 mg by  mouth 3 (three) times daily as needed for dizziness or nausea. ) 90 tablet 6  . Multiple Vitamin (MULTIVITAMIN WITH MINERALS) TABS Take 1 tablet by mouth daily.    Marland Kitchen zolpidem (AMBIEN) 10 MG tablet Take 1 tablet (10 mg total) by mouth at bedtime as needed for sleep. 90 tablet 1   No facility-administered medications prior to visit.    Allergies  Allergen Reactions  . Cortisone Anaphylaxis  . Dilaudid [Hydromorphone Hcl] Nausea And Vomiting    Pt states she will start vomiting immediately for 6 hours  . Iodine Anaphylaxis  . Medrol [Methylprednisolone] Anaphylaxis  . Omnipaque [Iohexol] Anaphylaxis  . Prednisone Anaphylaxis and Swelling  . Shellfish Allergy Anaphylaxis  . Sulfa Drugs Cross Reactors Anaphylaxis  . Doxycycline     Thrush/itching  . Methylprednisolone Sodium Succ Swelling    Blood pressure drops immediately  . Sulfa Antibiotics Other (See Comments)    Stomach upset  . Augmentin [Amoxicillin-Pot Clavulanate] Nausea Only    Upset stomach (Has taken cephalexin in the past without problems)  . Codeine Rash and Other (See Comments)    Upset stomach  . Erythromycin Rash  . Morphine And Related Nausea Only    nausea    Review of Systems  Constitutional: Positive for activity change, appetite change, diaphoresis and fatigue. Negative for chills and fever.  Respiratory: Negative for cough, chest tightness, shortness of breath and wheezing.   Cardiovascular: Positive for chest pain. Negative for palpitations and leg swelling.  Gastrointestinal: Positive for abdominal distention, abdominal pain and nausea. Negative for anal bleeding, blood in stool, constipation, diarrhea, rectal pain and vomiting.  Genitourinary: Positive for dysuria, flank pain, frequency and urgency. Negative for pelvic pain.  Musculoskeletal: Negative for back pain.  Neurological: Positive for weakness. Negative for dizziness and light-headedness.       Objective:    Physical Exam Constitutional:       Appearance: Normal appearance.  HENT:     Head: Normocephalic.  Eyes:  Extraocular Movements: Extraocular movements intact.     Pupils: Pupils are equal, round, and reactive to light.  Cardiovascular:     Rate and Rhythm: Normal rate and regular rhythm.     Pulses: Normal pulses.     Heart sounds: Normal heart sounds.  Pulmonary:     Effort: Pulmonary effort is normal.     Breath sounds: Normal breath sounds.  Abdominal:     General: Abdomen is flat. Bowel sounds are normal. There is distension.     Palpations: Abdomen is soft. There is no mass.     Tenderness: There is abdominal tenderness in the epigastric area, left upper quadrant and left lower quadrant. There is guarding. There is no right CVA tenderness, left CVA tenderness or rebound.     Hernia: No hernia is present.  Musculoskeletal:        General: Normal range of motion.     Cervical back: Normal range of motion.     Right lower leg: No edema.     Left lower leg: No edema.  Skin:    General: Skin is warm and dry.     Capillary Refill: Capillary refill takes less than 2 seconds.     Coloration: Skin is not jaundiced or pale.  Neurological:     General: No focal deficit present.     Mental Status: She is alert and oriented to person, place, and time.  Psychiatric:        Mood and Affect: Mood normal.        Behavior: Behavior normal.        Thought Content: Thought content normal.        Judgment: Judgment normal.     BP (!) 151/82   Pulse 61   Temp 97.6 F (36.4 C) (Oral)   Ht 5\' 3"  (1.6 m)   Wt 156 lb (70.8 kg)   BMI 27.63 kg/m  Wt Readings from Last 3 Encounters:  09/21/19 156 lb (70.8 kg)  09/09/19 151 lb (68.5 kg)  09/03/19 151 lb (68.5 kg)    There are no preventive care reminders to display for this patient.  There are no preventive care reminders to display for this patient.   Lab Results  Component Value Date   TSH 1.38 09/03/2019   Lab Results  Component Value Date   WBC 5.3  09/09/2019   HGB 12.3 09/09/2019   HCT 37.5 09/09/2019   MCV 91.0 09/09/2019   PLT 294 09/09/2019   Lab Results  Component Value Date   NA 140 09/09/2019   K 3.9 09/09/2019   CO2 25 09/09/2019   GLUCOSE 113 (H) 09/09/2019   BUN 17 09/09/2019   CREATININE 0.83 09/09/2019   BILITOT 0.6 09/03/2019   ALKPHOS 58 02/17/2019   AST 26 09/03/2019   ALT 21 09/03/2019   PROT 7.8 09/03/2019   ALBUMIN 4.3 02/17/2019   CALCIUM 8.5 (L) 09/09/2019   ANIONGAP 10 09/09/2019   Lab Results  Component Value Date   CHOL 129 02/17/2019   Lab Results  Component Value Date   HDL 47 02/17/2019   Lab Results  Component Value Date   LDLCALC 53 02/17/2019   Lab Results  Component Value Date   TRIG 178 (H) 02/17/2019   Lab Results  Component Value Date   CHOLHDL 2.7 02/17/2019   No results found for: HGBA1C     Assessment & Plan:  1. LUQ abdominal pain Left upper quadrant abdominal pain of unknown etiology.  Recent laboratory results, chest x-ray, and ultrasound were unremarkable.  Patient does have a positive UA, however, believe this is unrelated to these symptoms. Differential diagnoses include possible pancreatitis or gastritis.  Unable to rule out diverticulitis at this time. Will obtain lipase and CBC labs to evaluate pancreatic enzymes and presence of infection. Will consider imaging based on lab results and persistence of the patient's symptoms.  Urine has been sent for culture. Patient does have an appointment with her gastroenterologist on Monday at 41 and an appointment with her cardiologist on Wednesday.  Patient instructed to keep both of these appointments and we will follow. We will follow up with patient once lab results are received and next step in the plan of care is determined. - Lipase - CBC with Differential/Platelet - Urine Culture  2. Epigastric pain Epigastric pain of unknown etiology.  Instructions for the patient to increase omeprazole to 40 mg twice a day  for the next 7 days to see if this helps with symptoms at all.  It is effective we will consider increasing omeprazole to 40 mg twice a day for an additional 30-day course.   We will also obtain a lipase, and CBC with differential to determine if there is a pancreatic or infectious etiology to the pain she is experiencing. Once lab results are received will determine if imaging is necessary based on results and symptoms. Prescription for Zofran provided for intermittent nausea and to aid in the patient's ability to take p.o. medication and food. Patient has an appointment with gastroenterologist on Monday and an appointment t with cardiologist on Wednesday.  Patient instructed to keep both of these appointments and we will follow. We will follow up with patient once lab results are received and next step in plan of care is determined. - Lipase - CBC with Differential/Platelet - ondansetron (ZOFRAN-ODT) 8 MG disintegrating tablet; Take 1 tablet (8 mg total) by mouth every 8 (eight) hours as needed for nausea.  Dispense: 30 tablet; Refill: 3  3. Cystitis Acute cystitis as evidenced by positive leukocytes on urinalysis.  Will treat patient with Macrobid for 5 days.  Urine sent for culture.  Will notify patient if medication changes are required. Patient to attempt to increase fluid intake as much as possible.  Zofran was provided for nausea unrelated to the urinary tract infection however that could be affecting her ability to hydrate.  Patient to follow-up if symptoms worsen or fail to improve. - POCT URINALYSIS DIP (CLINITEK) - nitrofurantoin, macrocrystal-monohydrate, (MACROBID) 100 MG capsule; Take 1 capsule (100 mg total) by mouth 2 (two) times daily.  Dispense: 10 capsule; Refill: 0 - Urine Culture    Orma Render, NP

## 2019-09-22 ENCOUNTER — Encounter: Payer: Self-pay | Admitting: Nurse Practitioner

## 2019-09-22 LAB — CBC WITH DIFFERENTIAL/PLATELET
Absolute Monocytes: 390 cells/uL (ref 200–950)
Basophils Absolute: 51 cells/uL (ref 0–200)
Basophils Relative: 0.8 %
Eosinophils Absolute: 128 cells/uL (ref 15–500)
Eosinophils Relative: 2 %
HCT: 38.2 % (ref 35.0–45.0)
Hemoglobin: 12.8 g/dL (ref 11.7–15.5)
Lymphs Abs: 1376 cells/uL (ref 850–3900)
MCH: 29.8 pg (ref 27.0–33.0)
MCHC: 33.5 g/dL (ref 32.0–36.0)
MCV: 89 fL (ref 80.0–100.0)
MPV: 9.1 fL (ref 7.5–12.5)
Monocytes Relative: 6.1 %
Neutro Abs: 4454 cells/uL (ref 1500–7800)
Neutrophils Relative %: 69.6 %
Platelets: 311 10*3/uL (ref 140–400)
RBC: 4.29 10*6/uL (ref 3.80–5.10)
RDW: 12.2 % (ref 11.0–15.0)
Total Lymphocyte: 21.5 %
WBC: 6.4 10*3/uL (ref 3.8–10.8)

## 2019-09-22 LAB — LIPASE: Lipase: 20 U/L (ref 7–60)

## 2019-09-23 DIAGNOSIS — R197 Diarrhea, unspecified: Secondary | ICD-10-CM | POA: Diagnosis not present

## 2019-09-23 DIAGNOSIS — Z883 Allergy status to other anti-infective agents status: Secondary | ICD-10-CM | POA: Diagnosis not present

## 2019-09-23 DIAGNOSIS — Z79899 Other long term (current) drug therapy: Secondary | ICD-10-CM | POA: Diagnosis not present

## 2019-09-23 DIAGNOSIS — Z79890 Hormone replacement therapy (postmenopausal): Secondary | ICD-10-CM | POA: Diagnosis not present

## 2019-09-23 DIAGNOSIS — Z87892 Personal history of anaphylaxis: Secondary | ICD-10-CM | POA: Diagnosis not present

## 2019-09-23 DIAGNOSIS — Z888 Allergy status to other drugs, medicaments and biological substances status: Secondary | ICD-10-CM | POA: Diagnosis not present

## 2019-09-23 DIAGNOSIS — K5790 Diverticulosis of intestine, part unspecified, without perforation or abscess without bleeding: Secondary | ICD-10-CM | POA: Diagnosis not present

## 2019-09-23 DIAGNOSIS — K3189 Other diseases of stomach and duodenum: Secondary | ICD-10-CM | POA: Diagnosis not present

## 2019-09-23 DIAGNOSIS — I739 Peripheral vascular disease, unspecified: Secondary | ICD-10-CM | POA: Diagnosis not present

## 2019-09-23 DIAGNOSIS — I4891 Unspecified atrial fibrillation: Secondary | ICD-10-CM | POA: Diagnosis not present

## 2019-09-23 DIAGNOSIS — Z885 Allergy status to narcotic agent status: Secondary | ICD-10-CM | POA: Diagnosis not present

## 2019-09-23 DIAGNOSIS — Z9861 Coronary angioplasty status: Secondary | ICD-10-CM | POA: Diagnosis not present

## 2019-09-23 DIAGNOSIS — R52 Pain, unspecified: Secondary | ICD-10-CM | POA: Diagnosis not present

## 2019-09-23 DIAGNOSIS — Z91013 Allergy to seafood: Secondary | ICD-10-CM | POA: Diagnosis not present

## 2019-09-23 DIAGNOSIS — E785 Hyperlipidemia, unspecified: Secondary | ICD-10-CM | POA: Diagnosis not present

## 2019-09-23 DIAGNOSIS — R001 Bradycardia, unspecified: Secondary | ICD-10-CM | POA: Diagnosis not present

## 2019-09-23 DIAGNOSIS — K219 Gastro-esophageal reflux disease without esophagitis: Secondary | ICD-10-CM | POA: Diagnosis not present

## 2019-09-23 DIAGNOSIS — Z87891 Personal history of nicotine dependence: Secondary | ICD-10-CM | POA: Diagnosis not present

## 2019-09-23 DIAGNOSIS — R1084 Generalized abdominal pain: Secondary | ICD-10-CM | POA: Diagnosis not present

## 2019-09-23 DIAGNOSIS — R1010 Upper abdominal pain, unspecified: Secondary | ICD-10-CM | POA: Diagnosis not present

## 2019-09-23 DIAGNOSIS — Z7902 Long term (current) use of antithrombotics/antiplatelets: Secondary | ICD-10-CM | POA: Diagnosis not present

## 2019-09-23 DIAGNOSIS — Z7982 Long term (current) use of aspirin: Secondary | ICD-10-CM | POA: Diagnosis not present

## 2019-09-23 DIAGNOSIS — R112 Nausea with vomiting, unspecified: Secondary | ICD-10-CM | POA: Diagnosis not present

## 2019-09-23 DIAGNOSIS — I2510 Atherosclerotic heart disease of native coronary artery without angina pectoris: Secondary | ICD-10-CM | POA: Diagnosis not present

## 2019-09-23 DIAGNOSIS — K2900 Acute gastritis without bleeding: Secondary | ICD-10-CM | POA: Diagnosis not present

## 2019-09-23 DIAGNOSIS — R0602 Shortness of breath: Secondary | ICD-10-CM | POA: Diagnosis not present

## 2019-09-23 DIAGNOSIS — K2970 Gastritis, unspecified, without bleeding: Secondary | ICD-10-CM | POA: Diagnosis not present

## 2019-09-23 DIAGNOSIS — Z880 Allergy status to penicillin: Secondary | ICD-10-CM | POA: Diagnosis not present

## 2019-09-23 DIAGNOSIS — Z882 Allergy status to sulfonamides status: Secondary | ICD-10-CM | POA: Diagnosis not present

## 2019-09-23 DIAGNOSIS — I10 Essential (primary) hypertension: Secondary | ICD-10-CM | POA: Diagnosis not present

## 2019-09-23 DIAGNOSIS — R069 Unspecified abnormalities of breathing: Secondary | ICD-10-CM | POA: Diagnosis not present

## 2019-09-23 LAB — URINE CULTURE
MICRO NUMBER:: 10335140
SPECIMEN QUALITY:: ADEQUATE

## 2019-09-27 DIAGNOSIS — R935 Abnormal findings on diagnostic imaging of other abdominal regions, including retroperitoneum: Secondary | ICD-10-CM | POA: Diagnosis not present

## 2019-09-27 DIAGNOSIS — R1010 Upper abdominal pain, unspecified: Secondary | ICD-10-CM | POA: Diagnosis not present

## 2019-09-29 DIAGNOSIS — R1013 Epigastric pain: Secondary | ICD-10-CM | POA: Diagnosis not present

## 2019-09-29 DIAGNOSIS — R933 Abnormal findings on diagnostic imaging of other parts of digestive tract: Secondary | ICD-10-CM | POA: Diagnosis not present

## 2019-09-30 ENCOUNTER — Other Ambulatory Visit: Payer: Self-pay

## 2019-09-30 ENCOUNTER — Ambulatory Visit (INDEPENDENT_AMBULATORY_CARE_PROVIDER_SITE_OTHER): Payer: Medicare Other | Admitting: Cardiovascular Disease

## 2019-09-30 VITALS — BP 140/68 | HR 63 | Ht 63.0 in | Wt 153.0 lb

## 2019-09-30 DIAGNOSIS — K551 Chronic vascular disorders of intestine: Secondary | ICD-10-CM | POA: Diagnosis not present

## 2019-09-30 DIAGNOSIS — I2510 Atherosclerotic heart disease of native coronary artery without angina pectoris: Secondary | ICD-10-CM | POA: Diagnosis not present

## 2019-09-30 DIAGNOSIS — I150 Renovascular hypertension: Secondary | ICD-10-CM | POA: Diagnosis not present

## 2019-09-30 DIAGNOSIS — I700 Atherosclerosis of aorta: Secondary | ICD-10-CM | POA: Diagnosis not present

## 2019-09-30 DIAGNOSIS — I739 Peripheral vascular disease, unspecified: Secondary | ICD-10-CM

## 2019-09-30 DIAGNOSIS — E7800 Pure hypercholesterolemia, unspecified: Secondary | ICD-10-CM

## 2019-09-30 NOTE — Progress Notes (Signed)
Patient ID: Traci [name], female   DOB: Oct 16, 1945, 74 y.o.   MRN: [id number]    Cardiology Office Note    Date:  10/02/2019   ID:  [name], DOB 05-15-1946, MRN [id number]  PCP:  Hali Marry, MD  Cardiologist:   Sanda Klein, MD   Chief Complaint  Patient presents with  . Coronary Artery Disease  . Hypertension    History of Present Illness:  Traci [name] is a 74 y.o. female with an extensive history of peripheral arterial disease involving the mesenteric circulation and upper extremities, mild nonobstructive coronary atherosclerosis, hyperlipidemia, hypertension.  She was awoken at 5 AM on September 09, 2019 with stabbing epigastric pain radiating to the left upper quadrant.  It was so severe she could not catch her breath.  "On a scale of 10/10 it was a 15."  She was administered nitroglycerin which she thinks helped.  She went to the emergency room and is nonplussed by the fact that everybody focused on her abdomen and her mesenteric artery bypass.  An abdominal ultrasound showed the bypass to be patent but was a focus study that did not report on any other vascular or structural abnormalities.  Her electrocardiogram did not show any acute changes (she has chronic nonspecific T wave changes in V3-V4) and 2 sets of high-sensitivity troponin assays were normal.  She has been troubled by broad fluctuations in her blood pressure.  She states that sometimes she will just be sitting down at rest and will feel that something is changing and feels uneasy.  She checks her blood pressure and it will be as high as 193/96 and this is often followed by a drop in the heart rate to a minimum of 51.  She finds this very scary, although she does not develop chest pain or dyspnea or headache when these events happen.  They seem to resolve spontaneously after 30 minutes or so.  On other occasions she feels that her blood pressure is low when she gets dizzy, for  example when she was pushing the cart in the grocery store.  She denies exertional angina or intermittent claudication.  She has not had palpitations or syncope, focal neurological complaints or leg edema.  She continues to drink protein shakes to avoid losing weight because she develops abdominal discomfort if she eats more than 700 cal in a day.  She is followed at [facility] by Dr. Scot Dock.  She reports that she had anaphylaxis in response to contrast administration in the past but that she is also allergic to Solu-Medrol and prednisone which make her "pass out".  Cardiac catheterization performed with exclusively Benadryl as premedication occurred without adverse events.  Traci [name] has long-standing history of peripheral arterial disease with previous stents and surgical revascularization of the left subclavian artery for subclavian steal syndrome and presyncope. She eventually underwent a left carotid to subclavian bypass.  She underwent a bypass from the aorta to the chronically totally occluded superficial mesenteric artery due to symptoms of chronic mesenteric ischemia, (Dr. Scot Dock August 2015 6 mm Dacron graft) She had moderate CAD by coronary angiography performed in 2009. Normal left ventricular systolic function and no perfusion abnormalities by echo 2015 and nuclear stress test performed in 2014 and  October 2017. She had carotid duplex ultrasonography last performed in March 2019, without evidence of significant obstruction. She has a left carotid-subclavian bypass graft without evidence of stenosis. The aortic SMA bypass was noted on abdominal CT performed in July 2018,  although no comment was made about its patency. There was no evidence of aortic aneurysm and no comment made about the renal arteries.   Past Medical History:  Diagnosis Date  . Anxiety   . Atrial fibrillation ([place])   . CAD (coronary artery disease)   . Claudication ([place]) 01/06/2008   Lower extremity dopplers -  no evidence of arterial insufficiency, normal exam  . Coronary artery disease   . GERD (gastroesophageal reflux disease)   . Hyperlipidemia   . Hypertension 11/30/2010   echo- EF 55%; normal w/ mildly sclerotic aortic valve  . Hypertension 11/05/11   renal dopplers - celiac artery and SMA >50% diameter reduction, R renal artery - mildly elevated velocities 1-59% diameter reduction, L renal artery normal  . Nonspecific ST-T wave electrocardiographic changes 03/26/2011   R/Lmv - EF 74%, normal perfusion all regions, ST depression w/ Lexiscan infusion w/o assoc angina  . Osteopenia   . PAD (peripheral artery disease) ([place])   . Peripheral vascular disease ([place])   . PVD (peripheral vascular disease) ([place]) 11/05/2011   doppler - R/L brachial pressures essentially equal w/o inflow disease; L sublclavian/CCA bypass graft demonstrates patent flow, no evidence of significant stenosis  . Sigmoid diverticulitis     Past Surgical History:  Procedure Laterality Date  . ABDOMINAL AORTIC ANEURYSM REPAIR N/A 01/27/2014   Procedure: AORTO-SUPERIOR MESENTERIC ARTERY BYPASS GRAFT;  Surgeon: Angelia Mould, MD;  Location: [employer] [facility];  Service: Vascular;  Laterality: N/A;  . ABDOMINAL HYSTERECTOMY    . APPENDECTOMY    . CARDIAC CATHETERIZATION  06/03/2008   60% LAD, involving D2, borderline significant by IVUS, medical therapy. CFX, RCA OK.  Marland Kitchen CATARACT EXTRACTION    . CHOLECYSTECTOMY    . EYE SURGERY     retinal surgery  . SPINE SURGERY  Feb. 2011  . [place]   X's  several  . SUBCLAVIAN ARTERY STENT Left 09/20/2008   LSA ISR 7x49mm Cordis Genesis on Opta premount, reduction from 80% to 0%  . subclavian artery stents  01/23/2010   Left carotid to subclavian artery Bypass  . VISCERAL ANGIOGRAM  01/17/2014   Procedure: VISCERAL ANGIOGRAM;  Surgeon: Angelia Mould, MD;  Location: [facility] CATH LAB;  Service: Cardiovascular;;    Outpatient Medications Prior to Visit  Medication Sig  Dispense Refill  . aspirin EC 81 MG tablet Take 81 mg by mouth daily.    Marland Kitchen atenolol (TENORMIN) 25 MG tablet TAKE 1 TABLET DAILY (Patient taking differently: Take 25 mg by mouth daily. ) 90 tablet 3  . atorvastatin (LIPITOR) 10 MG tablet TAKE 1 TABLET DAILY AT 6 P.M. (Patient taking differently: Take 10 mg by mouth every evening. ) 90 tablet 1  . candesartan (ATACAND) 16 MG tablet TAKE ONE-HALF (1/2) TABLET DAILY (Patient taking differently: Take 8 mg by mouth daily. ) 45 tablet 1  . cholecalciferol (VITAMIN D) 1000 UNITS tablet Take 1,000 Units by mouth daily.    . clopidogrel (PLAVIX) 75 MG tablet TAKE 1 TABLET DAILY 90 tablet 1  . clorazepate (TRANXENE) 7.5 MG tablet Take 1 tablet (7.5 mg total) by mouth daily as needed. (Patient taking differently: Take 7.5 mg by mouth daily. ) 90 tablet 1  . cyanocobalamin 1000 MCG tablet Take 100 mcg by mouth daily.    Marland Kitchen EPINEPHrine (EPIPEN 2-PAK) 0.3 mg/0.3 mL IJ SOAJ injection Inject 0.3 mLs (0.3 mg total) into the muscle as needed (for allergic reaction). 2 Device 0  . esomeprazole (NEXIUM)  40 MG capsule Take 1 capsule (40 mg total) by mouth daily. Take 1 tab daily (Patient taking differently: Take 40 mg by mouth daily. ) 90 capsule 0  . estradiol (ESTRACE) 0.5 MG tablet Take 1 tablet (0.5 mg total) by mouth daily. 90 tablet 3  . furosemide (LASIX) 20 MG tablet TAKE 1 TABLET EVERY OTHER DAY 45 tablet 3  . linaclotide (LINZESS) 145 MCG CAPS capsule Take 1 capsule by mouth daily as needed (constipation).     . meclizine (ANTIVERT) 25 MG tablet TAKE ONE-HALF (1/2) TABLET THREE TIMES A DAY AS NEEDED FOR DIZZINESS OR NAUSEA (Patient taking differently: Take 12.5 mg by mouth 3 (three) times daily as needed for dizziness or nausea. ) 90 tablet 6  . Multiple Vitamin (MULTIVITAMIN WITH MINERALS) TABS Take 1 tablet by mouth daily.    . ondansetron (ZOFRAN-ODT) 8 MG disintegrating tablet Take 1 tablet (8 mg total) by mouth every 8 (eight) hours as needed for nausea. 30  tablet 3  . zolpidem (AMBIEN) 10 MG tablet Take 1 tablet (10 mg total) by mouth at bedtime as needed for sleep. 90 tablet 1  . dicyclomine (BENTYL) 10 MG capsule Take 1 capsule (10 mg total) by mouth 4 (four) times daily -  before meals and at bedtime. 90 capsule 0  . nitrofurantoin, macrocrystal-monohydrate, (MACROBID) 100 MG capsule Take 1 capsule (100 mg total) by mouth 2 (two) times daily. 10 capsule 0   No facility-administered medications prior to visit.     Allergies:   Cortisone, Dilaudid [hydromorphone hcl], Iodine, Medrol [methylprednisolone], Omnipaque [iohexol], Prednisone, Shellfish allergy, Sulfa drugs cross reactors, Doxycycline, Methylprednisolone sodium succ, Sulfa antibiotics, Augmentin [amoxicillin-pot clavulanate], Codeine, Erythromycin, and Morphine and related   Social History   Socioeconomic History  . Marital status: Widowed    Spouse name: Not on file  . Number of children: 1  . Years of education: 74  . Highest education level: Bachelor's degree (e.g., BA, AB, BS)  Occupational History  . Occupation: retired    Comment: Marine scientist  Tobacco Use  . Smoking status: Former Smoker    Packs/day: 0.25    Years: 20.00    Pack years: 5.00    Types: Cigarettes    Quit date: 09/30/1993    Years since quitting: 26.0  . Smokeless tobacco: Never Used  Substance and Sexual Activity  . Alcohol use: No  . Drug use: No  . Sexual activity: Not Currently  Other Topics Concern  . 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 No caffeine use.   Social Determinants of Health   Financial Resource Strain:   . Difficulty of Paying Living Expenses:   Food Insecurity:   . Worried About Charity fundraiser in the Last Year:   . Arboriculturist in the Last Year:   Transportation Needs:   . Film/video editor (Medical):   Marland Kitchen Lack of Transportation (Non-Medical):   Physical Activity:   . Days of Exercise per Week:   . Minutes  of Exercise per Session:   Stress:   . Feeling of Stress :   Social Connections:   . Frequency of Communication with Friends and Family:   . Frequency of Social Gatherings with Friends and Family:   . Attends Religious Services:   . Active Member of Clubs or Organizations:   . Attends Archivist Meetings:   Marland Kitchen Marital Status:  Family History:  The patient's family history includes Diabetes in her maternal grandmother; Heart attack in her father; Heart disease in her paternal grandfather; Heart disease (age of onset: 57) in her father; Hyperlipidemia in her father; Hypertension in her father; Kidney disease in her father; Kidney failure in her mother.   ROS:   Please see the history of present illness.    ROS All other systems are reviewed and are negative  PHYSICAL EXAM:   VS:  BP 140/68   Pulse 63   Ht 5\' 3"  (1.6 m)   Wt 153 lb (69.4 kg)   SpO2 98%   BMI 27.10 kg/m    Blood pressure left arm is roughly 20 mmHg lower   General: Alert, oriented x3, no distress, appears comfortable Head: no evidence of trauma, PERRL, EOMI, no exophtalmos or lid lag, no myxedema, no xanthelasma; normal ears, nose and oropharynx Neck: normal jugular venous pulsations and no hepatojugular reflux; carotid pulses are brisk where she has bilateral bruits.  She has a scar over her previous left subclavian carotid bypass. Chest: clear to auscultation, no signs of consolidation by percussion or palpation, normal fremitus, symmetrical and full respiratory excursions Cardiovascular: normal position and quality of the apical impulse, regular rhythm, normal first and second heart sounds, 2/6 early peaking aortic ejection murmur no diastolic murmurs, rubs or gallops.  Normal pulses in the radials and ulnar's bilaterally.  1-2+ symmetrical pedal pulses. Abdomen: no tenderness or distention, no masses by palpation, no abnormal pulsatility, normal bowel sounds, no hepatosplenomegaly.  She has systolic  periumbilical bruits. Extremities: no clubbing, cyanosis or edema Neurological: grossly nonfocal Psych: Normal mood and affect   Wt Readings from Last 3 Encounters:  09/30/19 153 lb (69.4 kg)  09/21/19 156 lb (70.8 kg)  09/09/19 151 lb (68.5 kg)    Studies/Labs Reviewed:   EKG:  EKG is ordered today.  It shows sinus rhythm with nonspecific T wave changes in leads V3-V4 QTC 452 ms.  Duplex carotid ultrasound December 16, 2018: Summary: Right Carotid: Velocities in the right ICA are consistent with a 1-39% stenosis.  Left Carotid: Velocities in the left ICA are consistent with a 1-39% stenosis.               Widely patent left common carotid to subclavian artery bypass               graft.  Vertebrals:  Bilateral vertebral arteries demonstrate antegrade flow. Subclavians: Normal flow hemodynamics were seen in bilateral subclavian              arteries.   Lexiscan Myoview 01/13/2019:  The left ventricular ejection fraction is hyperdynamic (>65%).  Nuclear stress EF: 71%.  There was no ST segment deviation noted during stress.  T wave inversion was noted during stress in the II, III, aVF, V3, V4, V5 and V6 leads, beginning at 30 seconds of stress, and returning to baseline after less than 1 min of recovery.  This is a low risk study.   No ischemia or infarction noted.  There is significant tracer uptake in the bowel which may impact the interpretation of the inferior wall which appears to have normal perfusion. Wall motion is normal, therefore perfusion is likely normal.   Consider performing future studies on the D-Spect (supine and upright) camera.    Recent Labs: 09/03/2019: ALT 21; TSH 1.38 09/09/2019: BUN 17; Creatinine, Ser 0.83; Potassium 3.9; Sodium 140 09/21/2019: Hemoglobin 12.8; Platelets 311   Lipid Panel  Component Value Date/Time   CHOL 129 02/17/2019 1234   TRIG 178 (H) 02/17/2019 1234   HDL 47 02/17/2019 1234   CHOLHDL 2.7 02/17/2019 1234   CHOLHDL  2.7 02/07/2016 0822   VLDL 26 02/07/2016 0822   LDLCALC 53 02/17/2019 1234    ASSESSMENT:    1. Coronary artery disease, non-occlusive, last cath 2009   2. Superior mesenteric artery stenosis ([place])   3. PAD (peripheral artery disease) ([place])   4. Pure hypercholesterolemia   5. Atherosclerosis of aorta ([place])   6. Renovascular hypertension    PLAN:  In order of problems listed above:  1. CAD:  known moderate stenosis in the LAD artery by previous coronary angiography.  No evidence of ischemia by myocardial perfusion study January 05, 2019.  I think she would be a good candidate for a contrast coronary CT angiogram to evaluate her recent epigastric discomfort, but her previous contrast reactions are disincentive, even though she reportedly did okay when she was premedicated with antihistamines alone.  She tells me she cannot take steroids in any formulation. 2. 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 Status post aorto mesenteric bypass.  The ultrasound performed in the emergency room last month showed a widely patent bypass. 3. L SCL artery occlusion: Does not have any symptoms of upper extremity claudication.  Normal findings on recent duplex ultrasound. 4. HLP: Lipid profile from last September showed good LDL cholesterol level (53, target less than 70). 5. Ao atherosclerosis: [place] on radiological studies and associated with extensive PAD.  Increases the likelihood of renal artery stenosis and secondary hypertension. 6. HTN: Her recent fluctuations in blood pressure are symptomatic.  Will check for renal artery stenosis with ultrasound.  I provided her with a prescription for clonidine 0.1 mg to take as needed.  I pointed out that she should not do this just because of abnormal blood pressure values, but only when she is symptomatic.  I warned her of the possible sedative effect and asked her to avoid taking the medication more than twice in 1 day.  Continue atenolol and  candesartan.  Consider a 24-hour blood pressure monitor.  Medication Adjustments/Labs and Tests Ordered: Current medicines are reviewed at length with the patient today.  Concerns regarding medicines are outlined above.  Medication changes, Labs and Tests ordered today are listed in the Patient Instructions below. Patient Instructions  Medication Instructions:  No changes *If you need a refill on your cardiac medications before your next appointment, please call your pharmacy*   Lab Work: None ordered If you have labs (blood work) drawn today and your tests are completely normal, you will receive your results only by: Marland Kitchen MyChart Message (if you have MyChart) OR . A paper copy in the mail If you have any lab test that is abnormal or we need to change your treatment, we will call you to review the results.   Testing/Procedures: Your physician has requested that you have a renal artery duplex. During this test, an ultrasound is used to evaluate blood flow to the kidneys. Take your medications as you usually do. This will take place at [place], [address].   No food after 11PM the night before.  Water is OK. (Don't drink liquids if you have been instructed not to for ANOTHER test).  Take two Extra-Strength Gas-X capsules at bedtime the night before test.   Take an additional two Extra-Strength Gas-X capsules three (3) hours before the test or first thing in the morning.  Avoid foods that produce bowel gas, for 24 hours prior to exam (see below).    No breakfast, no chewing gum, no smoking or carbonated beverages.  Patient may take morning medications with water.  Come in for test at least 15 minutes early to register.    Follow-Up: At [facility], you and your health needs are our priority.  As part of our continuing mission to provide you with exceptional heart care, we have created designated Provider Care Teams.  These Care Teams include your primary Cardiologist  (physician) and Advanced Practice Providers (APPs -  Physician Assistants and Nurse Practitioners) who all work together to provide you with the care you need, when you need it.  We recommend signing up for the patient portal called "MyChart".  Sign up information is provided on this After Visit Summary.  MyChart is used to connect with patients for Virtual Visits (Telemedicine).  Patients are able to view lab/test results, encounter notes, upcoming appointments, etc.  Non-urgent messages can be sent to your provider as well.   To learn more about what you can do with MyChart, go to [email or web address].    Your next appointment:   6 month(s)  The format for your next appointment:   In Person  Provider:   You may see Sanda Klein, MD or one of the following Advanced Practice Providers on your designated Care Team:    Almyra Deforest, PA-C  Fabian Sharp, [place] or   Roby Lofts, [place]    Other Instructions Follow up after the Renal Duplex for a virtual appointment with an APP       Signed, Sanda Klein, MD  10/02/2019 5:40 PM    [facility] [address], [place], [place]  [address] Phone: [phone]; Fax: [phone]

## 2019-09-30 NOTE — Patient Instructions (Signed)
Medication Instructions:  No changes *If you need a refill on your cardiac medications before your next appointment, please call your pharmacy*   Lab Work: None ordered If you have labs (blood work) drawn today and your tests are completely normal, you will receive your results only by: Marland Kitchen MyChart Message (if you have MyChart) OR . A paper copy in the mail If you have any lab test that is abnormal or we need to change your treatment, we will call you to review the results.   Testing/Procedures: Your physician has requested that you have a renal artery duplex. During this test, an ultrasound is used to evaluate blood flow to the kidneys. Take your medications as you usually do. This will take place at [place], [address].   No food after 11PM the night before.  Water is OK. (Don't drink liquids if you have been instructed not to for ANOTHER test).  Take two Extra-Strength Gas-X capsules at bedtime the night before test.   Take an additional two Extra-Strength Gas-X capsules three (3) hours before the test or first thing in the morning.    Avoid foods that produce bowel gas, for 24 hours prior to exam (see below).    No breakfast, no chewing gum, no smoking or carbonated beverages.  Patient may take morning medications with water.  Come in for test at least 15 minutes early to register.    Follow-Up: At [facility], you and your health needs are our priority.  As part of our continuing mission to provide you with exceptional heart care, we have created designated Provider Care Teams.  These Care Teams include your primary Cardiologist (physician) and Advanced Practice Providers (APPs -  Physician Assistants and Nurse Practitioners) who all work together to provide you with the care you need, when you need it.  We recommend signing up for the patient portal called "MyChart".  Sign up information is provided on this After Visit Summary.  MyChart is used to connect with  patients for Virtual Visits (Telemedicine).  Patients are able to view lab/test results, encounter notes, upcoming appointments, etc.  Non-urgent messages can be sent to your provider as well.   To learn more about what you can do with MyChart, go to [email or web address].    Your next appointment:   6 month(s)  The format for your next appointment:   In Person  Provider:   You may see Sanda Klein, MD or one of the following Advanced Practice Providers on your designated Care Team:    Almyra Deforest, PA-C  Fabian Sharp, [place] or   Roby Lofts, [place]    Other Instructions Follow up after the Renal Duplex for a virtual appointment with an APP

## 2019-10-01 MED ORDER — CLONIDINE HCL 0.1 MG PO TABS: ORAL_TABLET | ORAL | 0 refills | Status: DC

## 2019-10-02 ENCOUNTER — Encounter: Payer: Self-pay | Admitting: Cardiovascular Disease

## 2019-10-05 ENCOUNTER — Ambulatory Visit (HOSPITAL_COMMUNITY)
Admission: RE | Admit: 2019-10-05 | Discharge: 2019-10-05 | Disposition: A | Discharge status: Home / Self Care | Payer: Medicare Other | Source: Ambulatory Visit | Attending: Cardiovascular Disease | Admitting: Cardiovascular Disease

## 2019-10-05 ENCOUNTER — Other Ambulatory Visit: Payer: Self-pay

## 2019-10-05 ENCOUNTER — Other Ambulatory Visit: Payer: Self-pay | Admitting: Family Medicine

## 2019-10-05 DIAGNOSIS — I150 Renovascular hypertension: Secondary | ICD-10-CM | POA: Diagnosis not present

## 2019-10-05 NOTE — Addendum Note (Signed)
Addended by: Teddy Spike on: 10/05/2019 04:14 PM   Modules accepted: Orders

## 2019-10-06 ENCOUNTER — Encounter: Payer: Self-pay | Admitting: Family Medicine

## 2019-10-12 ENCOUNTER — Encounter: Payer: Self-pay | Admitting: Family Medicine

## 2019-10-12 MED ORDER — CLORAZEPATE DIPOTASSIUM 7.5 MG PO TABS: 7.5000 mg | ORAL_TABLET | Freq: Every day | ORAL | 0 refills | Status: DC | PRN

## 2019-10-12 NOTE — Telephone Encounter (Signed)
Prefect. meds sent.

## 2019-10-27 ENCOUNTER — Encounter: Payer: Self-pay | Admitting: Family Medicine

## 2019-10-27 ENCOUNTER — Other Ambulatory Visit: Payer: Self-pay

## 2019-10-27 ENCOUNTER — Ambulatory Visit (INDEPENDENT_AMBULATORY_CARE_PROVIDER_SITE_OTHER): Payer: Medicare Other | Admitting: Family Medicine

## 2019-10-27 DIAGNOSIS — I2510 Atherosclerotic heart disease of native coronary artery without angina pectoris: Secondary | ICD-10-CM

## 2019-10-27 DIAGNOSIS — G4700 Insomnia, unspecified: Secondary | ICD-10-CM

## 2019-10-27 DIAGNOSIS — I10 Essential (primary) hypertension: Secondary | ICD-10-CM

## 2019-10-27 DIAGNOSIS — N1831 Chronic kidney disease, stage 3a: Secondary | ICD-10-CM

## 2019-10-27 NOTE — Progress Notes (Signed)
Established Patient Office Visit  Subjective:  Patient ID: Traci [name], female    DOB: 1945-12-25  Age: 74 y.o. MRN: [id number]  CC:  Chief Complaint  Patient presents with  . 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  Fortunately more recently she was just diagnosed with gastritis and since taking the medication she has felt significantly better.  In fact she completed it and she is now off of it.  F/U insomnia - uses ambien prn.  Uses half a tab usually.    Bradycardia -she is actually feeling much better overall.  Past Medical History:  Diagnosis Date  . Anxiety   . Atrial fibrillation ([place])   . CAD (coronary artery disease)   . Claudication ([place]) 01/06/2008   Lower extremity dopplers - no evidence of arterial insufficiency, normal exam  . Coronary artery disease   . GERD (gastroesophageal reflux disease)   . Hyperlipidemia   . Hypertension 11/30/2010   echo- EF 55%; normal w/ mildly sclerotic aortic valve  . Hypertension 11/05/11   renal dopplers - celiac artery and SMA >50% diameter reduction, R renal artery - mildly elevated velocities 1-59% diameter reduction, L renal artery normal  . Nonspecific ST-T wave electrocardiographic changes 03/26/2011   R/Lmv - EF 74%, normal perfusion all regions, ST depression w/ Lexiscan infusion w/o assoc angina  . Osteopenia   . PAD (peripheral artery disease) ([place])   . Peripheral vascular disease ([place])   . PVD (peripheral vascular disease) ([place]) 11/05/2011   doppler - R/L brachial pressures essentially equal w/o inflow disease; L sublclavian/CCA bypass graft demonstrates patent flow, no evidence of significant stenosis  . Sigmoid diverticulitis     Past Surgical History:  Procedure Laterality Date  . ABDOMINAL AORTIC ANEURYSM REPAIR N/A 01/27/2014   Procedure: AORTO-SUPERIOR MESENTERIC ARTERY BYPASS GRAFT;   Surgeon: Angelia Mould, MD;  Location: [facility];  Service: Vascular;  Laterality: N/A;  . ABDOMINAL HYSTERECTOMY    . APPENDECTOMY    . CARDIAC CATHETERIZATION  06/03/2008   60% LAD, involving D2, borderline significant by IVUS, medical therapy. CFX, RCA OK.  Marland Kitchen CATARACT EXTRACTION    . CHOLECYSTECTOMY    . EYE SURGERY     retinal surgery  . SPINE SURGERY  Feb. 2011  . [place]   X's  several  . SUBCLAVIAN ARTERY STENT Left 09/20/2008   LSA ISR 7x19mm Cordis Genesis on Opta premount, reduction from 80% to 0%  . subclavian artery stents  01/23/2010   Left carotid to subclavian artery Bypass  . VISCERAL ANGIOGRAM  01/17/2014   Procedure: VISCERAL ANGIOGRAM;  Surgeon: Angelia Mould, MD;  Location: [facility] CATH LAB;  Service: Cardiovascular;;    Family History  Problem Relation Age of Onset  . Heart disease Father 83       Heart Disease before age 41  . Kidney disease Father   . Heart attack Father   . Hyperlipidemia Father   . Hypertension Father   . Kidney failure Mother   . Diabetes Maternal Grandmother   . Heart disease Paternal Grandfather     Social History   Socioeconomic History  . Marital status: Widowed    Spouse name: Not on file  . Number of children: 1  . Years of education: 38  . Highest education level: Bachelor's degree (e.g., BA, AB, BS)  Occupational History  . Occupation:  retired    Comment: Nurse  Tobacco Use  . Smoking status: Former Smoker    Packs/day: 0.25    Years: 20.00    Pack years: 5.00    Types: Cigarettes    Quit date: 09/30/1993    Years since quitting: 26.1  . Smokeless tobacco: Never Used  Substance and Sexual Activity  . Alcohol use: No  . Drug use: No  . Sexual activity: Not Currently  Other Topics Concern  . 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 No caffeine use.   Social Determinants of Health   Financial Resource Strain:   . Difficulty of  Paying Living Expenses:   Food Insecurity:   . Worried About Charity fundraiser in the Last Year:   . Arboriculturist in the Last Year:   Transportation Needs:   . Film/video editor (Medical):   Marland Kitchen Lack of Transportation (Non-Medical):   Physical Activity:   . Days of Exercise per Week:   . Minutes of Exercise per Session:   Stress:   . Feeling of Stress :   Social Connections:   . Frequency of Communication with Friends and Family:   . Frequency of Social Gatherings with Friends and Family:   . Attends Religious Services:   . Active Member of Clubs or Organizations:   . Attends Archivist Meetings:   Marland Kitchen Marital Status:   Intimate Partner Violence:   . Fear of Current or Ex-Partner:   . Emotionally Abused:   Marland Kitchen Physically Abused:   . Sexually Abused:     Outpatient Medications Prior to Visit  Medication Sig Dispense Refill  . aspirin EC 81 MG tablet Take 81 mg by mouth daily.    Marland Kitchen atenolol (TENORMIN) 25 MG tablet TAKE 1 TABLET DAILY (Patient taking differently: Take 25 mg by mouth daily. ) 90 tablet 3  . atorvastatin (LIPITOR) 10 MG tablet TAKE 1 TABLET DAILY AT 6 P.M. (Patient taking differently: Take 10 mg by mouth every evening. ) 90 tablet 1  . candesartan (ATACAND) 16 MG tablet TAKE ONE-HALF (1/2) TABLET DAILY (Patient taking differently: Take 8 mg by mouth daily. ) 45 tablet 1  . cholecalciferol (VITAMIN D) 1000 UNITS tablet Take 1,000 Units by mouth daily.    . clopidogrel (PLAVIX) 75 MG tablet TAKE 1 TABLET DAILY 90 tablet 1  . clorazepate (TRANXENE) 7.5 MG tablet Take 1 tablet (7.5 mg total) by mouth daily as needed. 90 tablet 0  . cyanocobalamin 1000 MCG tablet Take 100 mcg by mouth daily.    Marland Kitchen EPINEPHrine (EPIPEN 2-PAK) 0.3 mg/0.3 mL IJ SOAJ injection Inject 0.3 mLs (0.3 mg total) into the muscle as needed (for allergic reaction). 2 Device 0  . esomeprazole (NEXIUM) 40 MG capsule Take 1 capsule (40 mg total) by mouth daily. Take 1 tab daily (Patient taking  differently: Take 40 mg by mouth daily. ) 90 capsule 0  . estradiol (ESTRACE) 0.5 MG tablet Take 1 tablet (0.5 mg total) by mouth daily. 90 tablet 3  . furosemide (LASIX) 20 MG tablet TAKE 1 TABLET EVERY OTHER DAY 45 tablet 3  . linaclotide (LINZESS) 145 MCG CAPS capsule Take 1 capsule by mouth daily as needed (constipation).     . meclizine (ANTIVERT) 25 MG tablet TAKE ONE-HALF (1/2) TABLET THREE TIMES A DAY AS NEEDED FOR DIZZINESS OR NAUSEA (Patient taking differently: Take 12.5 mg by mouth 3 (three) times  daily as needed for dizziness or nausea. ) 90 tablet 6  . Multiple Vitamin (MULTIVITAMIN WITH MINERALS) TABS Take 1 tablet by mouth daily.    Marland Kitchen zolpidem (AMBIEN) 10 MG tablet Take 1 tablet (10 mg total) by mouth at bedtime as needed for sleep. 90 tablet 1  . cloNIDine (CATAPRES) 0.1 MG tablet Take one tablet by mouth as needed for elevated blood pressure 30 tablet 0  . ondansetron (ZOFRAN-ODT) 8 MG disintegrating tablet Take 1 tablet (8 mg total) by mouth every 8 (eight) hours as needed for nausea. 30 tablet 3   No facility-administered medications prior to visit.    Allergies  Allergen Reactions  . Cortisone Anaphylaxis  . Dilaudid [Hydromorphone Hcl] Nausea And Vomiting    Pt states she will start vomiting immediately for 6 hours  . Iodine Anaphylaxis  . Medrol [Methylprednisolone] Anaphylaxis  . Omnipaque [Iohexol] Anaphylaxis  . Prednisone Anaphylaxis and Swelling  . Shellfish Allergy Anaphylaxis  . Sulfa Drugs Cross Reactors Anaphylaxis  . Doxycycline     Thrush/itching  . Methylprednisolone Sodium Succ Swelling    Blood pressure drops immediately  . Sulfa Antibiotics Other (See Comments)    Stomach upset  . Augmentin [Amoxicillin-Pot Clavulanate] Nausea Only    Upset stomach (Has taken cephalexin in the past without problems)  . Codeine Rash and Other (See Comments)    Upset stomach  . Erythromycin Rash  . Morphine And Related Nausea Only    nausea    ROS Review of  Systems    Objective:    Physical Exam  Constitutional: She is oriented to person, place, and time. She appears well-developed and well-nourished.  HENT:  Head: Normocephalic and atraumatic.  Cardiovascular: Normal rate, regular rhythm and normal heart sounds.  Pulmonary/Chest: Effort normal and breath sounds normal.  Neurological: She is alert and oriented to person, place, and time.  Skin: Skin is warm and dry.  Psychiatric: She has a normal mood and affect. Her behavior is normal.    BP 140/60   Pulse (!) 58   Ht 5\' 3"  (1.6 m)   Wt 154 lb (69.9 kg)   SpO2 100%   BMI 27.28 kg/m  Wt Readings from Last 3 Encounters:  10/27/19 154 lb (69.9 kg)  09/30/19 153 lb (69.4 kg)  09/21/19 156 lb (70.8 kg)     There are no preventive care reminders to display for this patient.  There are no preventive care reminders to display for this patient.  Lab Results  Component Value Date   TSH 1.38 09/03/2019   Lab Results  Component Value Date   WBC 6.4 09/21/2019   HGB 12.8 09/21/2019   HCT 38.2 09/21/2019   MCV 89.0 09/21/2019   PLT 311 09/21/2019   Lab Results  Component Value Date   NA 140 09/09/2019   K 3.9 09/09/2019   CO2 25 09/09/2019   GLUCOSE 113 (H) 09/09/2019   BUN 17 09/09/2019   CREATININE 0.83 09/09/2019   BILITOT 0.6 09/03/2019   ALKPHOS 58 02/17/2019   AST 26 09/03/2019   ALT 21 09/03/2019   PROT 7.8 09/03/2019   ALBUMIN 4.3 02/17/2019   CALCIUM 8.5 (L) 09/09/2019   ANIONGAP 10 09/09/2019   Lab Results  Component Value Date   CHOL 129 02/17/2019   Lab Results  Component Value Date   HDL 47 02/17/2019   Lab Results  Component Value Date   LDLCALC 53 02/17/2019   Lab Results  Component Value Date  TRIG 178 (H) 02/17/2019   Lab Results  Component Value Date   CHOLHDL 2.7 02/17/2019   No results found for: HGBA1C    Assessment & Plan:   Problem List Items Addressed This Visit      Cardiovascular and Mediastinum   Hypertension  (Chronic)    Blood pressure is a little borderline today at 140/60 so definitely keep an eye on this.  Would really like to see her pressure under 130 if at all possible but she does have significant PVD.        Genitourinary   CKD (chronic kidney disease) stage 3, GFR 30-59 ml/min    Following renal function every 6 months.        Other   Insomnia    Uses Ambien as needed when due for next refill we will change to 5 mg tabs.      Relevant Medications   zolpidem (AMBIEN) 5 MG tablet      Gastritis-seems to have resolved.  She is actually feeling much better which is wonderful. Completed coarse of PPI.   Meds ordered this encounter  Medications  . zolpidem (AMBIEN) 5 MG tablet    Sig: Take 1 tablet (5 mg total) by mouth at bedtime as needed for sleep.    Dispense:  90 tablet    Refill:  0    Place on file. Will replace the 10 mg dose.  Pt will call when needed.    Follow-up: Return in about 4 months (around 02/27/2020) for Hypertension.    Beatrice Lecher, MD

## 2019-11-01 ENCOUNTER — Encounter: Payer: Self-pay | Admitting: Family Medicine

## 2019-11-01 MED ORDER — ZOLPIDEM TARTRATE 5 MG PO TABS: 5.0000 mg | ORAL_TABLET | Freq: Every evening | ORAL | 0 refills | Status: DC | PRN

## 2019-11-01 NOTE — Assessment & Plan Note (Signed)
Blood pressure is a little borderline today at 140/60 so definitely keep an eye on this.  Would really like to see her pressure under 130 if at all possible but she does have significant PVD.

## 2019-11-01 NOTE — Assessment & Plan Note (Signed)
Following renal function every 6 months. 

## 2019-11-01 NOTE — Assessment & Plan Note (Signed)
Uses Ambien as needed when due for next refill we will change to 5 mg tabs.

## 2019-11-10 ENCOUNTER — Other Ambulatory Visit: Payer: Self-pay | Admitting: Family Medicine

## 2019-11-10 DIAGNOSIS — Z1231 Encounter for screening mammogram for malignant neoplasm of breast: Secondary | ICD-10-CM

## 2019-12-08 ENCOUNTER — Ambulatory Visit: Payer: Medicare Other | Admitting: Family Medicine

## 2019-12-15 DIAGNOSIS — E785 Hyperlipidemia, unspecified: Secondary | ICD-10-CM | POA: Diagnosis not present

## 2019-12-15 DIAGNOSIS — Z880 Allergy status to penicillin: Secondary | ICD-10-CM | POA: Diagnosis not present

## 2019-12-15 DIAGNOSIS — Z87891 Personal history of nicotine dependence: Secondary | ICD-10-CM | POA: Diagnosis not present

## 2019-12-15 DIAGNOSIS — Z888 Allergy status to other drugs, medicaments and biological substances status: Secondary | ICD-10-CM | POA: Diagnosis not present

## 2019-12-15 DIAGNOSIS — B1910 Unspecified viral hepatitis B without hepatic coma: Secondary | ICD-10-CM | POA: Diagnosis not present

## 2019-12-15 DIAGNOSIS — M62838 Other muscle spasm: Secondary | ICD-10-CM | POA: Diagnosis not present

## 2019-12-15 DIAGNOSIS — K219 Gastro-esophageal reflux disease without esophagitis: Secondary | ICD-10-CM | POA: Diagnosis not present

## 2019-12-15 DIAGNOSIS — Z955 Presence of coronary angioplasty implant and graft: Secondary | ICD-10-CM | POA: Diagnosis not present

## 2019-12-15 DIAGNOSIS — Z7982 Long term (current) use of aspirin: Secondary | ICD-10-CM | POA: Diagnosis not present

## 2019-12-15 DIAGNOSIS — Z91013 Allergy to seafood: Secondary | ICD-10-CM | POA: Diagnosis not present

## 2019-12-15 DIAGNOSIS — M545 Low back pain: Secondary | ICD-10-CM | POA: Diagnosis not present

## 2019-12-15 DIAGNOSIS — Z79899 Other long term (current) drug therapy: Secondary | ICD-10-CM | POA: Diagnosis not present

## 2019-12-15 DIAGNOSIS — Z7902 Long term (current) use of antithrombotics/antiplatelets: Secondary | ICD-10-CM | POA: Diagnosis not present

## 2019-12-15 DIAGNOSIS — I2510 Atherosclerotic heart disease of native coronary artery without angina pectoris: Secondary | ICD-10-CM | POA: Diagnosis not present

## 2019-12-15 DIAGNOSIS — I10 Essential (primary) hypertension: Secondary | ICD-10-CM | POA: Diagnosis not present

## 2019-12-17 ENCOUNTER — Encounter: Payer: Self-pay | Admitting: Family Medicine

## 2019-12-17 NOTE — Progress Notes (Signed)
Colon entered 

## 2020-01-03 ENCOUNTER — Encounter: Payer: Self-pay | Admitting: Nurse Practitioner

## 2020-01-03 ENCOUNTER — Ambulatory Visit (INDEPENDENT_AMBULATORY_CARE_PROVIDER_SITE_OTHER): Payer: Medicare Other

## 2020-01-03 ENCOUNTER — Other Ambulatory Visit: Payer: Self-pay

## 2020-01-03 ENCOUNTER — Ambulatory Visit (INDEPENDENT_AMBULATORY_CARE_PROVIDER_SITE_OTHER): Payer: Medicare Other | Admitting: Nurse Practitioner

## 2020-01-03 VITALS — BP 144/67 | HR 57

## 2020-01-03 DIAGNOSIS — M5416 Radiculopathy, lumbar region: Secondary | ICD-10-CM

## 2020-01-03 DIAGNOSIS — I2510 Atherosclerotic heart disease of native coronary artery without angina pectoris: Secondary | ICD-10-CM

## 2020-01-03 DIAGNOSIS — M545 Low back pain: Secondary | ICD-10-CM | POA: Diagnosis not present

## 2020-01-03 MED ORDER — KETOROLAC TROMETHAMINE 10 MG PO TABS: 10.0000 mg | ORAL_TABLET | Freq: Four times a day (QID) | ORAL | 0 refills | Status: DC | PRN

## 2020-01-03 NOTE — Progress Notes (Signed)
Acute Office Visit  Subjective:    Patient ID: Traci [name], female    DOB: Sep 26, 1945, 74 y.o.   MRN: [id number]  Chief Complaint  Patient presents with  . Back Pain    Back Pain This is a new problem. The current episode started 1 to 4 weeks ago. The problem occurs daily. The problem is unchanged. The pain is present in the lumbar spine and thoracic spine. The quality of the pain is described as burning. The pain radiates to the left thigh, right thigh and right knee. The pain is at a severity of 8/10. The pain is the same all the time. The symptoms are aggravated by position. Associated symptoms include tingling. Pertinent negatives include no abdominal pain, bladder incontinence, bowel incontinence, dysuria, fever, leg pain, numbness, pelvic pain, perianal numbness or weakness. She has tried analgesics, heat and ice for the symptoms. The treatment provided no relief.   Patient is in today for low back pain with radiation primarily down into her right leg and occasionally into the left leg. She states her symptoms started about 2 weeks ago. She reports a "pins and needles" sensation down her spine, through her buttocks, and down the lateral portion of her right leg to the proximal portion of the right ankle. She reports occasional symptoms down the left lateral thigh, but this sensation stops proximal to the knee. She also reports the pain occasionally goes up high into her back. She does have a history of back surgery in 2011 with rod placement in her spine starting at L4 into S1. She reports the sensation is similar to what she experienced prior to her back surgery. She states the pain is worse when moving the right hip and leg and when laying on her right side. She also reports "pinched nerve" pain in the hips bilaterally.   She has tried lidocaine patches, extra strength tylenol, and deep heat rub with no change in her pain.   She denies any known injury to her back or hips. She  reports she does regular housework, but does not lift or move heavy objects. 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 She denies weakness to her lower extremities or swelling. She denies fever or chills.   She does endorse the sensation of feeling "off balance" when walking and describes this as leaning more to the right side than the left.  Past Medical History:  Diagnosis Date  . Anxiety   . Atrial fibrillation ([place])   . CAD (coronary artery disease)   . Claudication ([place]) 01/06/2008   Lower extremity dopplers - no evidence of arterial insufficiency, normal exam  . Coronary artery disease   . GERD (gastroesophageal reflux disease)   . Hyperlipidemia   . Hypertension 11/30/2010   echo- EF 55%; normal w/ mildly sclerotic aortic valve  . Hypertension 11/05/11   renal dopplers - celiac artery and SMA >50% diameter reduction, R renal artery - mildly elevated velocities 1-59% diameter reduction, L renal artery normal  . Nonspecific ST-T wave electrocardiographic changes 03/26/2011   R/Lmv - EF 74%, normal perfusion all regions, ST depression w/ Lexiscan infusion w/o assoc angina  . Osteopenia   . PAD (peripheral artery disease) ([place])   . Peripheral vascular disease ([place])   . PVD (peripheral vascular disease) ([place]) 11/05/2011  doppler - R/L brachial pressures essentially equal w/o inflow disease; L sublclavian/CCA bypass graft demonstrates patent flow, no evidence of significant stenosis  . Sigmoid diverticulitis     Past Surgical History:  Procedure Laterality Date  . ABDOMINAL AORTIC ANEURYSM REPAIR N/A 01/27/2014   Procedure: AORTO-SUPERIOR MESENTERIC ARTERY BYPASS GRAFT;  Surgeon: Angelia Mould, MD;  Location: [facility];  Service: Vascular;  Laterality: N/A;  . ABDOMINAL HYSTERECTOMY    . APPENDECTOMY    . CARDIAC CATHETERIZATION   06/03/2008   60% LAD, involving D2, borderline significant by IVUS, medical therapy. CFX, RCA OK.  Marland Kitchen CATARACT EXTRACTION    . CHOLECYSTECTOMY    . EYE SURGERY     retinal surgery  . SPINE SURGERY  Feb. 2011  . [place]   X's  several  . SUBCLAVIAN ARTERY STENT Left 09/20/2008   LSA ISR 7x44mm Cordis Genesis on Opta premount, reduction from 80% to 0%  . subclavian artery stents  01/23/2010   Left carotid to subclavian artery Bypass  . VISCERAL ANGIOGRAM  01/17/2014   Procedure: VISCERAL ANGIOGRAM;  Surgeon: Angelia Mould, MD;  Location: [facility] CATH LAB;  Service: Cardiovascular;;    Family History  Problem Relation Age of Onset  . Heart disease Father 60       Heart Disease before age 78  . Kidney disease Father   . Heart attack Father   . Hyperlipidemia Father   . Hypertension Father   . Kidney failure Mother   . Diabetes Maternal Grandmother   . Heart disease Paternal Grandfather     Social History   Socioeconomic History  . Marital status: Widowed    Spouse name: Not on file  . Number of children: 1  . Years of education: 67  . Highest education level: Bachelor's degree (e.g., BA, AB, BS)  Occupational History  . Occupation: retired    Comment: Marine scientist  Tobacco Use  . Smoking status: Former Smoker    Packs/day: 0.25    Years: 20.00    Pack years: 5.00    Types: Cigarettes    Quit date: 09/30/1993    Years since quitting: 26.2  . Smokeless tobacco: Never Used  Vaping Use  . Vaping Use: Never used  Substance and Sexual Activity  . Alcohol use: No  . Drug use: No  . Sexual activity: Not Currently  Other Topics Concern  . 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 No caffeine use.   Social Determinants of Health   Financial Resource Strain:   . Difficulty of Paying Living Expenses:   Food Insecurity:   . Worried About Charity fundraiser in the Last Year:   . Arboriculturist in the Last  Year:   Transportation Needs:   . Film/video editor (Medical):   Marland Kitchen Lack of Transportation (Non-Medical):   Physical Activity:   . Days of Exercise per Week:   . Minutes of Exercise per Session:   Stress:   . Feeling of Stress :   Social Connections:   . Frequency of Communication with Friends and Family:   . Frequency of Social Gatherings with Friends and Family:   . Attends Religious Services:   . Active Member of Clubs or Organizations:   . Attends Archivist Meetings:   Marland Kitchen Marital Status:   Intimate Partner Violence:   . Fear of Current or Ex-Partner:   .  Emotionally Abused:   Marland Kitchen Physically Abused:   . Sexually Abused:     Outpatient Medications Prior to Visit  Medication Sig Dispense Refill  . aspirin EC 81 MG tablet Take 81 mg by mouth daily.    Marland Kitchen atenolol (TENORMIN) 25 MG tablet TAKE 1 TABLET DAILY (Patient taking differently: Take 25 mg by mouth daily. ) 90 tablet 3  . atorvastatin (LIPITOR) 10 MG tablet TAKE 1 TABLET DAILY AT 6 P.M. (Patient taking differently: Take 10 mg by mouth every evening. ) 90 tablet 1  . candesartan (ATACAND) 16 MG tablet TAKE ONE-HALF (1/2) TABLET DAILY (Patient taking differently: Take 8 mg by mouth daily. ) 45 tablet 1  . cholecalciferol (VITAMIN D) 1000 UNITS tablet Take 1,000 Units by mouth daily.    . clopidogrel (PLAVIX) 75 MG tablet TAKE 1 TABLET DAILY 90 tablet 1  . clorazepate (TRANXENE) 7.5 MG tablet Take 1 tablet (7.5 mg total) by mouth daily as needed. 90 tablet 0  . cyanocobalamin 1000 MCG tablet Take 100 mcg by mouth daily.    Marland Kitchen EPINEPHrine (EPIPEN 2-PAK) 0.3 mg/0.3 mL IJ SOAJ injection Inject 0.3 mLs (0.3 mg total) into the muscle as needed (for allergic reaction). 2 Device 0  . esomeprazole (NEXIUM) 40 MG capsule Take 1 capsule (40 mg total) by mouth daily. Take 1 tab daily (Patient taking differently: Take 40 mg by mouth daily. ) 90 capsule 0  . estradiol (ESTRACE) 0.5 MG tablet Take 1 tablet (0.5 mg total) by mouth  daily. 90 tablet 3  . furosemide (LASIX) 20 MG tablet TAKE 1 TABLET EVERY OTHER DAY 45 tablet 3  . linaclotide (LINZESS) 145 MCG CAPS capsule Take 1 capsule by mouth daily as needed (constipation).     . meclizine (ANTIVERT) 25 MG tablet TAKE ONE-HALF (1/2) TABLET THREE TIMES A DAY AS NEEDED FOR DIZZINESS OR NAUSEA (Patient taking differently: Take 12.5 mg by mouth 3 (three) times daily as needed for dizziness or nausea. ) 90 tablet 6  . Multiple Vitamin (MULTIVITAMIN WITH MINERALS) TABS Take 1 tablet by mouth daily.    Marland Kitchen zolpidem (AMBIEN) 5 MG tablet Take 1 tablet (5 mg total) by mouth at bedtime as needed for sleep. 90 tablet 0   No facility-administered medications prior to visit.    Allergies  Allergen Reactions  . Cortisone Anaphylaxis  . Dilaudid [Hydromorphone Hcl] Nausea And Vomiting    Pt states she will start vomiting immediately for 6 hours  . Iodine Anaphylaxis  . Medrol [Methylprednisolone] Anaphylaxis  . Omnipaque [Iohexol] Anaphylaxis  . Prednisone Anaphylaxis and Swelling  . Shellfish Allergy Anaphylaxis  . Sulfa Drugs Cross Reactors Anaphylaxis  . Doxycycline     Thrush/itching  . Methylprednisolone Sodium Succ Swelling    Blood pressure drops immediately  . Sulfa Antibiotics Other (See Comments)    Stomach upset  . Augmentin [Amoxicillin-Pot Clavulanate] Nausea Only    Upset stomach (Has taken cephalexin in the past without problems)  . Codeine Rash and Other (See Comments)    Upset stomach  . Erythromycin Rash  . Morphine And Related Nausea Only    nausea      Objective:    Physical Exam Vitals and nursing note reviewed.  Constitutional:      Appearance: Normal appearance.  HENT:     Head: Normocephalic.  Eyes:     Extraocular Movements: Extraocular movements intact.     Conjunctiva/sclera: Conjunctivae normal.     Pupils: Pupils are equal, round, and reactive to  light.  Cardiovascular:     Rate and Rhythm: Normal rate and regular rhythm.      Pulses: Normal pulses.     Heart sounds: Normal heart sounds.  Pulmonary:     Effort: Pulmonary effort is normal.     Breath sounds: Normal breath sounds.  Abdominal:     General: Abdomen is flat. Bowel sounds are normal. There is no distension.     Palpations: Abdomen is soft.     Tenderness: There is no abdominal tenderness.  Musculoskeletal:        General: Tenderness present.     Cervical back: Normal range of motion. No tenderness.     Thoracic back: No spasms, tenderness or bony tenderness. Normal range of motion.     Lumbar back: Tenderness and bony tenderness present. No swelling or edema. Positive right straight leg raise test and positive left straight leg raise test.       Back:     Right lower leg: No edema.     Left lower leg: No edema.     Comments: Positive straight leg raise bilaterally, with right significantly worse than left.   Hip flexors strong and equal bilaterally.   Hip pain noted with internal and external rotation of the hip on the right.   Skin:    General: Skin is warm and dry.     Capillary Refill: Capillary refill takes less than 2 seconds.  Neurological:     General: No focal deficit present.     Mental Status: She is alert and oriented to person, place, and time.     Sensory: No sensory deficit.     Motor: No weakness.     Coordination: Coordination normal.     Gait: Gait normal.     Deep Tendon Reflexes: Reflexes are normal and symmetric.  Psychiatric:        Mood and Affect: Mood normal.        Behavior: Behavior normal.        Thought Content: Thought content normal.        Judgment: Judgment normal.     BP (!) 144/67 (BP Location: Right Arm, Patient Position: Sitting)   Pulse (!) 57   SpO2 97%  Wt Readings from Last 3 Encounters:  10/27/19 154 lb (69.9 kg)  09/30/19 153 lb (69.4 kg)  09/21/19 156 lb (70.8 kg)    There are no preventive care reminders to display for this patient.  There are no preventive care reminders to display  for this patient.   Lab Results  Component Value Date   TSH 1.38 09/03/2019   Lab Results  Component Value Date   WBC 6.4 09/21/2019   HGB 12.8 09/21/2019   HCT 38.2 09/21/2019   MCV 89.0 09/21/2019   PLT 311 09/21/2019   Lab Results  Component Value Date   NA 140 09/09/2019   K 3.9 09/09/2019   CO2 25 09/09/2019   GLUCOSE 113 (H) 09/09/2019   BUN 17 09/09/2019   CREATININE 0.83 09/09/2019   BILITOT 0.6 09/03/2019   ALKPHOS 58 02/17/2019   AST 26 09/03/2019   ALT 21 09/03/2019   PROT 7.8 09/03/2019   ALBUMIN 4.3 02/17/2019   CALCIUM 8.5 (L) 09/09/2019   ANIONGAP 10 09/09/2019   Lab Results  Component Value Date   CHOL 129 02/17/2019   Lab Results  Component Value Date   HDL 47 02/17/2019   Lab Results  Component Value Date   LDLCALC 53 02/17/2019  Lab Results  Component Value Date   TRIG 178 (H) 02/17/2019   Lab Results  Component Value Date   CHOLHDL 2.7 02/17/2019   No results found for: HGBA1C     Assessment & Plan:   1. Lumbar radiculopathy Symptoms and presentation consistent with lumbar radiculopathy with bilateral involvement, right > left. Given her history of spinal surgery, I feel imaging is appropriate at this time to evaluate for bony structure or hardware abnormality that could be causing root compression. At this time there is no evidence of loss of reflexes, sensation, or muscle tone in the lower extremities. Will plan for x-ray and conservative management today with ice, heat, exercise, and NSAIDs. Patient is unable to take steroids due to allergy, therefore, will avoid that treatment option. Discussed the option of formal physical therapy pending results of x-ray.   PLAN: - DG Lumbar Spine Complete - ketorolac (TORADOL) 10 MG tablet; Take 1 tablet (10 mg total) by mouth every 6 (six) hours as needed for moderate pain or severe pain.  Dispense: 60 tablet; Refill: 0 - Gentle exercise to the lumbar spine as tolerated. - Continue ice and  heat to the affected area several times a day - Avoid lifting, pulling, or strenuous movement that may exacerbate pain.  - Avoid laying on the right side if this increases pain - Make follow-up appointment with sports medicine doctor in 2 weeks for further evaluation - Will report x-ray results and determine if a new plan is required.   Orma Render, NP

## 2020-01-03 NOTE — Patient Instructions (Addendum)
Alternate ice for 20 minutes at a time and heat for 20 minutes at a time 2-3 times per day.   Try the gentle stretching exercises on the handout to see if these help.  I have sent the prescription for Toradol, you can take this up to every 6 hours for pain and inflammation.   You may also try a TENS unit that can be purchased over the counter at places like [employer] with the pads placed on your lower back to help with pain.   You may also continue the lidocaine patches.   We will get the xray today and I will let you know.

## 2020-01-04 ENCOUNTER — Ambulatory Visit: Payer: Medicare Other | Admitting: Family Medicine

## 2020-01-05 ENCOUNTER — Other Ambulatory Visit: Payer: Self-pay

## 2020-01-05 ENCOUNTER — Ambulatory Visit (INDEPENDENT_AMBULATORY_CARE_PROVIDER_SITE_OTHER): Payer: Medicare Other

## 2020-01-05 ENCOUNTER — Encounter: Payer: Self-pay | Admitting: Nurse Practitioner

## 2020-01-05 DIAGNOSIS — Z1231 Encounter for screening mammogram for malignant neoplasm of breast: Secondary | ICD-10-CM | POA: Diagnosis not present

## 2020-01-05 NOTE — Progress Notes (Signed)
Your x-ray results show that everything is in alignment in your back and there does not appear to be any issues with the hardware. There are some degenerative changes in the spine and some spur formation on the bones of the spine. These changes are common as we age. Most likely the pain you are experiencing is related to sciatic nerve irritation which can occur with some degenerative changes and even with muscular irritation. I will send you some information on things that may help with the this type of pain through mychart. If this doesn't improve with these exercises and conservative measures, we can refer you to physical therapy to see if formal maneuvers will help.

## 2020-01-10 ENCOUNTER — Other Ambulatory Visit: Payer: Self-pay | Admitting: Neurology

## 2020-01-10 MED ORDER — CLORAZEPATE DIPOTASSIUM 7.5 MG PO TABS: 7.5000 mg | ORAL_TABLET | Freq: Every day | ORAL | 0 refills | Status: DC | PRN

## 2020-01-10 NOTE — Telephone Encounter (Signed)
Last filled 10/12/2019 #90 with no refills Last appt 01/03/20 for back pain with Emeterio Reeve, 10/27/19 for regular appt

## 2020-01-14 ENCOUNTER — Other Ambulatory Visit: Payer: Self-pay | Admitting: Cardiovascular Disease

## 2020-01-17 ENCOUNTER — Ambulatory Visit: Payer: Medicare Other | Admitting: Sports Medicine

## 2020-02-07 ENCOUNTER — Encounter: Payer: Self-pay | Admitting: Family Medicine

## 2020-02-07 ENCOUNTER — Ambulatory Visit (INDEPENDENT_AMBULATORY_CARE_PROVIDER_SITE_OTHER): Payer: Medicare Other | Admitting: Family Medicine

## 2020-02-07 ENCOUNTER — Other Ambulatory Visit: Payer: Self-pay

## 2020-02-07 VITALS — BP 143/67 | HR 61 | Ht 63.0 in | Wt 155.0 lb

## 2020-02-07 DIAGNOSIS — M5442 Lumbago with sciatica, left side: Secondary | ICD-10-CM | POA: Diagnosis not present

## 2020-02-07 DIAGNOSIS — M25561 Pain in right knee: Secondary | ICD-10-CM

## 2020-02-07 DIAGNOSIS — M5441 Lumbago with sciatica, right side: Secondary | ICD-10-CM

## 2020-02-07 DIAGNOSIS — I2510 Atherosclerotic heart disease of native coronary artery without angina pectoris: Secondary | ICD-10-CM | POA: Diagnosis not present

## 2020-02-07 DIAGNOSIS — M25562 Pain in left knee: Secondary | ICD-10-CM | POA: Diagnosis not present

## 2020-02-07 DIAGNOSIS — M2550 Pain in unspecified joint: Secondary | ICD-10-CM | POA: Diagnosis not present

## 2020-02-07 NOTE — Progress Notes (Signed)
Acute Office Visit  Subjective:    Patient ID: Traci [name], female    DOB: 10/12/1945, 74 y.o.   MRN: [id number]  Chief Complaint  Patient presents with  . Back Pain    HPI Patient is in today for Pt was seen by Clarise Cruz about 1 month ago for her back issues and was given some stretches to do at home. She stated that she did the stretches which did help her and she felt that she was getting better until about a week ago. She said that her back began to hurt again in the same place and above the area. She also c/o pain going down both sides of her legs and into her knees. She has been using ice, heat, patches,and topical treatments. She said that the pain is 8/10 the tailbone and hip pain is worse. She is unable to sit or stand for long periods of time.   She also complains of pain in her hips, knees, and ankles she says her ankles have actually been swelling a little bit more recently as well.  Most of her knee pain is over the lateral knees.  Using a lidocaine patch which does not help very much.  She has been using icy hot on her knees and ankles and it does help some.  She has been using an over-the-counter TENS unit that she developed after she was last here.  She cannot take oral prednisone.  She says the area directly over the lumbar spine sacral area is extremely tender to touch in the last couple of days.  Past Medical History:  Diagnosis Date  . Anxiety   . Atrial fibrillation ([place])   . CAD (coronary artery disease)   . Claudication ([place]) 01/06/2008   Lower extremity dopplers - no evidence of arterial insufficiency, normal exam  . Coronary artery disease   . GERD (gastroesophageal reflux disease)   . Hyperlipidemia   . Hypertension 11/30/2010   echo- EF 55%; normal w/ mildly sclerotic aortic valve  . Hypertension 11/05/11   renal dopplers - celiac artery and SMA >50% diameter reduction, R renal artery - mildly elevated velocities 1-59% diameter reduction, L renal artery  normal  . Nonspecific ST-T wave electrocardiographic changes 03/26/2011   R/Lmv - EF 74%, normal perfusion all regions, ST depression w/ Lexiscan infusion w/o assoc angina  . Osteopenia   . PAD (peripheral artery disease) ([place])   . Peripheral vascular disease ([employer])   . PVD (peripheral vascular disease) ([place]) 11/05/2011   doppler - R/L brachial pressures essentially equal w/o inflow disease; L sublclavian/CCA bypass graft demonstrates patent flow, no evidence of significant stenosis  . Sigmoid diverticulitis     Past Surgical History:  Procedure Laterality Date  . ABDOMINAL AORTIC ANEURYSM REPAIR N/A 01/27/2014   Procedure: AORTO-SUPERIOR MESENTERIC ARTERY BYPASS GRAFT;  Surgeon: Angelia Mould, MD;  Location: [place];  Service: Vascular;  Laterality: N/A;  . ABDOMINAL HYSTERECTOMY    . APPENDECTOMY    . CARDIAC CATHETERIZATION  06/03/2008   60% LAD, involving D2, borderline significant by IVUS, medical therapy. CFX, RCA OK.  Marland Kitchen CATARACT EXTRACTION    . CHOLECYSTECTOMY    . EYE SURGERY     retinal surgery  . SPINE SURGERY  Feb. 2011  . [place]   X's  several  . SUBCLAVIAN ARTERY STENT Left 09/20/2008   LSA ISR 7x86mm Cordis Genesis on Opta premount, reduction from 80% to 0%  . subclavian artery stents  01/23/2010   Left carotid to subclavian artery Bypass  . VISCERAL ANGIOGRAM  01/17/2014   Procedure: VISCERAL ANGIOGRAM;  Surgeon: Angelia Mould, MD;  Location: [facility] CATH LAB;  Service: Cardiovascular;;    Family History  Problem Relation Age of Onset  . Heart disease Father 45       Heart Disease before age 72  . Kidney disease Father   . Heart attack Father   . Hyperlipidemia Father   . Hypertension Father   . Kidney failure Mother   . Diabetes Maternal Grandmother   . Heart disease Paternal Grandfather     Social History   Socioeconomic History  . Marital status: Widowed    Spouse name: Not on file  . Number of children: 1  . Years of  education: 102  . Highest education level: Bachelor's degree (e.g., BA, AB, BS)  Occupational History  . Occupation: retired    Comment: Marine scientist  Tobacco Use  . Smoking status: Former Smoker    Packs/day: 0.25    Years: 20.00    Pack years: 5.00    Types: Cigarettes    Quit date: 09/30/1993    Years since quitting: 26.3  . Smokeless tobacco: Never Used  Vaping Use  . Vaping Use: Never used  Substance and Sexual Activity  . Alcohol use: No  . Drug use: No  . Sexual activity: Not Currently  Other Topics Concern  . 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 No caffeine use.   Social Determinants of Health   Financial Resource Strain:   . Difficulty of Paying Living Expenses: Not on file  Food Insecurity:   . Worried About Charity fundraiser in the Last Year: Not on file  . Ran Out of Food in the Last Year: Not on file  Transportation Needs:   . Lack of Transportation (Medical): Not on file  . Lack of Transportation (Non-Medical): Not on file  Physical Activity:   . Days of Exercise per Week: Not on file  . Minutes of Exercise per Session: Not on file  Stress:   . Feeling of Stress : Not on file  Social Connections:   . Frequency of Communication with Friends and Family: Not on file  . Frequency of Social Gatherings with Friends and Family: Not on file  . Attends Religious Services: Not on file  . Active Member of Clubs or Organizations: Not on file  . Attends Archivist Meetings: Not on file  . Marital Status: Not on file  Intimate Partner Violence:   . Fear of Current or Ex-Partner: Not on file  . Emotionally Abused: Not on file  . Physically Abused: Not on file  . Sexually Abused: Not on file    Outpatient Medications Prior to Visit  Medication Sig Dispense Refill  . aspirin EC 81 MG tablet Take 81 mg by mouth daily.    Marland Kitchen atenolol (TENORMIN) 25 MG tablet TAKE 1 TABLET DAILY (Patient taking differently:  Take 25 mg by mouth daily. ) 90 tablet 3  . atorvastatin (LIPITOR) 10 MG tablet TAKE 1 TABLET DAILY AT 6 P.M. 90 tablet 1  . candesartan (ATACAND) 16 MG tablet TAKE ONE-HALF (1/2) TABLET DAILY 45 tablet 1  . cholecalciferol (VITAMIN D) 1000 UNITS tablet Take 1,000 Units by mouth daily.    . clopidogrel (PLAVIX) 75 MG tablet TAKE 1 TABLET DAILY 90 tablet 1  . clorazepate (TRANXENE) 7.5  MG tablet Take 1 tablet (7.5 mg total) by mouth daily as needed. 90 tablet 0  . cyanocobalamin 1000 MCG tablet Take 100 mcg by mouth daily.    Marland Kitchen EPINEPHrine (EPIPEN 2-PAK) 0.3 mg/0.3 mL IJ SOAJ injection Inject 0.3 mLs (0.3 mg total) into the muscle as needed (for allergic reaction). 2 Device 0  . esomeprazole (NEXIUM) 40 MG capsule Take 1 capsule (40 mg total) by mouth daily. Take 1 tab daily (Patient taking differently: Take 40 mg by mouth daily. ) 90 capsule 0  . estradiol (ESTRACE) 0.5 MG tablet Take 1 tablet (0.5 mg total) by mouth daily. 90 tablet 3  . furosemide (LASIX) 20 MG tablet TAKE 1 TABLET EVERY OTHER DAY 45 tablet 3  . ketorolac (TORADOL) 10 MG tablet Take 1 tablet (10 mg total) by mouth every 6 (six) hours as needed for moderate pain or severe pain. 60 tablet 0  . linaclotide (LINZESS) 145 MCG CAPS capsule Take 1 capsule by mouth daily as needed (constipation).     . meclizine (ANTIVERT) 25 MG tablet TAKE ONE-HALF (1/2) TABLET THREE TIMES A DAY AS NEEDED FOR DIZZINESS OR NAUSEA (Patient taking differently: Take 12.5 mg by mouth 3 (three) times daily as needed for dizziness or nausea. ) 90 tablet 6  . Multiple Vitamin (MULTIVITAMIN WITH MINERALS) TABS Take 1 tablet by mouth daily.    Marland Kitchen zolpidem (AMBIEN) 5 MG tablet Take 1 tablet (5 mg total) by mouth at bedtime as needed for sleep. 90 tablet 0   No facility-administered medications prior to visit.    Allergies  Allergen Reactions  . Cortisone Anaphylaxis  . Dilaudid [Hydromorphone Hcl] Nausea And Vomiting    Pt states she will start vomiting  immediately for 6 hours  . Iodine Anaphylaxis  . Medrol [Methylprednisolone] Anaphylaxis  . Omnipaque [Iohexol] Anaphylaxis  . Prednisone Anaphylaxis and Swelling  . Shellfish Allergy Anaphylaxis  . Sulfa Drugs Cross Reactors Anaphylaxis  . Doxycycline     Thrush/itching  . Methylprednisolone Sodium Succ Swelling    Blood pressure drops immediately  . Sulfa Antibiotics Other (See Comments)    Stomach upset  . Augmentin [Amoxicillin-Pot Clavulanate] Nausea Only    Upset stomach (Has taken cephalexin in the past without problems)  . Codeine Rash and Other (See Comments)    Upset stomach  . Erythromycin Rash  . Morphine And Related Nausea Only    nausea    Review of Systems     Objective:    Physical Exam Vitals reviewed.  Constitutional:      Appearance: She is well-developed.  HENT:     Head: Normocephalic and atraumatic.  Eyes:     Conjunctiva/sclera: Conjunctivae normal.  Cardiovascular:     Rate and Rhythm: Normal rate.  Pulmonary:     Effort: Pulmonary effort is normal.  Musculoskeletal:     Comments: Mostly tender over the lower lumbar spine nontender over the SI joints.  Tender over the medial and lateral joint lines of both knees.  Patellar reflexes 0+.  Strength at the hips knees and ankles is 5 out of 5 bilaterally.  Negative straight leg raise bilaterally  Skin:    General: Skin is dry.     Coloration: Skin is not pale.  Neurological:     Mental Status: She is alert and oriented to person, place, and time.  Psychiatric:        Behavior: Behavior normal.     BP (!) 143/67   Pulse 61   Ht  5\' 3"  (1.6 m)   Wt 155 lb (70.3 kg)   SpO2 97%   BMI 27.46 kg/m  Wt Readings from Last 3 Encounters:  02/07/20 155 lb (70.3 kg)  10/27/19 154 lb (69.9 kg)  09/30/19 153 lb (69.4 kg)    Health Maintenance Due  Topic Date Due  . INFLUENZA VACCINE  01/16/2020    There are no preventive care reminders to display for this patient.   Lab Results  Component  Value Date   TSH 1.38 09/03/2019   Lab Results  Component Value Date   WBC 6.4 09/21/2019   HGB 12.8 09/21/2019   HCT 38.2 09/21/2019   MCV 89.0 09/21/2019   PLT 311 09/21/2019   Lab Results  Component Value Date   NA 140 09/09/2019   K 3.9 09/09/2019   CO2 25 09/09/2019   GLUCOSE 113 (H) 09/09/2019   BUN 17 09/09/2019   CREATININE 0.83 09/09/2019   BILITOT 0.6 09/03/2019   ALKPHOS 58 02/17/2019   AST 26 09/03/2019   ALT 21 09/03/2019   PROT 7.8 09/03/2019   ALBUMIN 4.3 02/17/2019   CALCIUM 8.5 (L) 09/09/2019   ANIONGAP 10 09/09/2019   Lab Results  Component Value Date   CHOL 129 02/17/2019   Lab Results  Component Value Date   HDL 47 02/17/2019   Lab Results  Component Value Date   LDLCALC 53 02/17/2019   Lab Results  Component Value Date   TRIG 178 (H) 02/17/2019   Lab Results  Component Value Date   CHOLHDL 2.7 02/17/2019   No results found for: HGBA1C     Assessment & Plan:   Problem List Items Addressed This Visit    None    Visit Diagnoses    Acute pain of both knees    -  Primary   Polyarthralgia       Relevant Orders   CBC with Differential/Platelet   Sedimentation rate   Uric acid   C-reactive protein   ANA   Cyclic citrul peptide antibody, IgG   Acute bilateral low back pain with bilateral sciatica       Relevant Orders   AMB referral to rehabilitation     Bilateral low back pain with bilateral radiculopathy-discussed options including referral to Ortho/sports med, formal physical therapy, getting an MRI.  Plain film imaging did not indicate any acute/worrisome findings.  History of prior lumbar fusion.  Patient would prefer physical therapy first she did great with water therapy 10 years ago after her back surgery and would like to be referred to [facility] rehab.  Continue with conservative therapy including topicals etc.   Polyarthralgia-she complains of hip, knee, ankle pain that has been bothering her as well we will do  additional work-up most including blood work to look for infection, inflammation, gout etc.  No orders of the defined types were placed in this encounter.    Beatrice Lecher, MD

## 2020-02-07 NOTE — Progress Notes (Signed)
Pt was seen by Clarise Cruz about 1 month ago for her back issues and was given some stretches to do at home. She stated that she did the stretches which did help her and she felt that she was getting better until about a week ago. She said that her back began to hurt again in the same place and above the area. She also c/o pain going down both sides of her legs and into her knees. She has been using ice, heat, patches,and topical treatments. She said that the pain is 8/10 the tailbone and hip pain is worse. She is unable to sit or stand for long periods of time.

## 2020-02-09 ENCOUNTER — Encounter: Payer: Self-pay | Admitting: Family Medicine

## 2020-02-09 DIAGNOSIS — M5432 Sciatica, left side: Secondary | ICD-10-CM | POA: Diagnosis not present

## 2020-02-09 DIAGNOSIS — M5431 Sciatica, right side: Secondary | ICD-10-CM | POA: Diagnosis not present

## 2020-02-09 DIAGNOSIS — M545 Low back pain: Secondary | ICD-10-CM | POA: Diagnosis not present

## 2020-02-09 LAB — CBC WITH DIFFERENTIAL/PLATELET
Absolute Monocytes: 369 cells/uL (ref 200–950)
Basophils Absolute: 41 cells/uL (ref 0–200)
Basophils Relative: 0.9 %
Eosinophils Absolute: 99 cells/uL (ref 15–500)
Eosinophils Relative: 2.2 %
HCT: 36.7 % (ref 35.0–45.0)
Hemoglobin: 12.5 g/dL (ref 11.7–15.5)
Lymphs Abs: 1490 cells/uL (ref 850–3900)
MCH: 30.2 pg (ref 27.0–33.0)
MCHC: 34.1 g/dL (ref 32.0–36.0)
MCV: 88.6 fL (ref 80.0–100.0)
MPV: 9.3 fL (ref 7.5–12.5)
Monocytes Relative: 8.2 %
Neutro Abs: 2502 cells/uL (ref 1500–7800)
Neutrophils Relative %: 55.6 %
Platelets: 310 10*3/uL (ref 140–400)
RBC: 4.14 10*6/uL (ref 3.80–5.10)
RDW: 12.1 % (ref 11.0–15.0)
Total Lymphocyte: 33.1 %
WBC: 4.5 10*3/uL (ref 3.8–10.8)

## 2020-02-09 LAB — URIC ACID: Uric Acid, Serum: 5.9 mg/dL (ref 2.5–7.0)

## 2020-02-09 LAB — ANTI-NUCLEAR AB-TITER (ANA TITER): ANA Titer 1: 1:40 {titer} — ABNORMAL HIGH

## 2020-02-09 LAB — SEDIMENTATION RATE: Sed Rate: 14 mm/h (ref 0–30)

## 2020-02-09 LAB — CYCLIC CITRUL PEPTIDE ANTIBODY, IGG: Cyclic Citrullin Peptide Ab: <16 UNITS

## 2020-02-09 LAB — C-REACTIVE PROTEIN: CRP: 2.8 mg/L (ref <8.0)

## 2020-02-09 LAB — ANA: Anti Nuclear Antibody (ANA): POSITIVE — AB

## 2020-02-15 ENCOUNTER — Other Ambulatory Visit: Payer: Self-pay

## 2020-02-15 ENCOUNTER — Emergency Department (INDEPENDENT_AMBULATORY_CARE_PROVIDER_SITE_OTHER)
Admission: EM | Admit: 2020-02-15 | Discharge: 2020-02-15 | Disposition: A | Discharge status: Home / Self Care | Payer: Medicare Other | Source: Home / Self Care | Attending: Family Medicine | Admitting: Family Medicine

## 2020-02-15 ENCOUNTER — Encounter: Payer: Self-pay | Admitting: Emergency Medicine

## 2020-02-15 ENCOUNTER — Emergency Department (INDEPENDENT_AMBULATORY_CARE_PROVIDER_SITE_OTHER): Payer: Medicare Other

## 2020-02-15 DIAGNOSIS — R300 Dysuria: Secondary | ICD-10-CM | POA: Diagnosis not present

## 2020-02-15 DIAGNOSIS — R109 Unspecified abdominal pain: Secondary | ICD-10-CM

## 2020-02-15 DIAGNOSIS — R1032 Left lower quadrant pain: Secondary | ICD-10-CM | POA: Diagnosis not present

## 2020-02-15 DIAGNOSIS — K5732 Diverticulitis of large intestine without perforation or abscess without bleeding: Secondary | ICD-10-CM

## 2020-02-15 LAB — POCT URINALYSIS DIP (MANUAL ENTRY)
Bilirubin, UA: NEGATIVE
Blood, UA: NEGATIVE
Glucose, UA: NEGATIVE mg/dL
Ketones, POC UA: NEGATIVE mg/dL
Leukocytes, UA: NEGATIVE
Nitrite, UA: NEGATIVE
Protein Ur, POC: NEGATIVE mg/dL
Spec Grav, UA: 1.015 (ref 1.010–1.025)
Urobilinogen, UA: 1 E.U./dL
pH, UA: 6 (ref 5.0–8.0)

## 2020-02-15 MED ORDER — LEVOFLOXACIN 750 MG PO TABS: 750.0000 mg | ORAL_TABLET | Freq: Every day | ORAL | 0 refills | Status: AC

## 2020-02-15 NOTE — Discharge Instructions (Addendum)
Begin clear liquids for about 24 hours, then may begin a BRAT diet (Bananas, Rice, Applesauce, Toast) when pain has decreased.  Then gradually advance to a regular diet as tolerated.    May take Tylenol as needed for pain.  If symptoms become significantly worse during the night or over the weekend, proceed to the local emergency room.

## 2020-02-15 NOTE — ED Provider Notes (Signed)
Traci [name] CARE    CSN: [id number] Arrival date & time: 02/15/20  0920      History   Chief Complaint Chief Complaint  Patient presents with  . Abdominal Pain  . Thrush  . Dysuria    HPI Traci [name] is a 74 y.o. female.   Patient developed left lower quadrant abdominal pain yesterday with fatigue, myalgias, and chills.  The pain has now spread somewhat but does not radiate.  She has had mild nausea without vomiting.  She admits to increased constipation recently.  She denies urinary symptoms. She has a past history of mesenteric ischemia and diverticulitis. Review of past records:  CT abdomen/pelvis done 05/02/14 showed sigmoid diverticulitis.  The history is provided by the patient.    Past Medical History:  Diagnosis Date  . Anxiety   . Atrial fibrillation ([place])   . CAD (coronary artery disease)   . Claudication ([place]) 01/06/2008   Lower extremity dopplers - no evidence of arterial insufficiency, normal exam  . Coronary artery disease   . GERD (gastroesophageal reflux disease)   . Hyperlipidemia   . Hypertension 11/30/2010   echo- EF 55%; normal w/ mildly sclerotic aortic valve  . Hypertension 11/05/11   renal dopplers - celiac artery and SMA >50% diameter reduction, R renal artery - mildly elevated velocities 1-59% diameter reduction, L renal artery normal  . Nonspecific ST-T wave electrocardiographic changes 03/26/2011   R/Lmv - EF 74%, normal perfusion all regions, ST depression w/ Lexiscan infusion w/o assoc angina  . Osteopenia   . PAD (peripheral artery disease) ([place])   . Peripheral vascular disease ([employer])   . PVD (peripheral vascular disease) ([place]) 11/05/2011   doppler - R/L brachial pressures essentially equal w/o inflow disease; L sublclavian/CCA bypass graft demonstrates patent flow, no evidence of significant stenosis  . Sigmoid diverticulitis     Patient Active Problem List   Diagnosis Date Noted  . Edema 09/03/2019  . Epigastric  pain 08/11/2019  . Insomnia 08/10/2019  . Hypocalcemia 06/03/2019  . Atherosclerosis of aorta ([place]) 01/04/2018  . Osteoporosis 12/25/2017  . CKD (chronic kidney disease) stage 3, GFR 30-59 ml/min 08/04/2017  . Primary osteoarthritis of first carpometacarpal joint of left hand 07/10/2017  . Strain of right Achilles tendon 04/14/2017  . Atypical nevus 08/22/2016  . Acute bilateral low back pain with left-sided sciatica 06/06/2016  . Menopause 12/08/2015  . History of colonoscopy 07/17/2015  . Irregular heartbeat 07/26/2014  . 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[place]) 01/16/2014  . Irritable bowel syndrome 09/21/2013  . Hyperlipidemia 04/05/2013  . Hypertension 12/06/2012  . Peripheral arterial disease ([place]) 12/06/2012  . CAD, cath 2009 12/06/2012  . Hyponatremia, improved holding diuretic 12/06/2012  . Lymphocele of left arm 07/14/2012  . Atherosclerosis of other specified arteries 04/02/2012  . 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 Stricture of artery ([place]) 10/02/2011  . Allergic dermatitis 06/09/2011    Past Surgical History:  Procedure Laterality Date  . ABDOMINAL AORTIC ANEURYSM REPAIR N/A 01/27/2014   Procedure: AORTO-SUPERIOR MESENTERIC ARTERY BYPASS GRAFT;  Surgeon: Angelia Mould, MD;  Location: [facility];  Service: Vascular;  Laterality: N/A;  . ABDOMINAL HYSTERECTOMY    . APPENDECTOMY    . CARDIAC CATHETERIZATION  06/03/2008   60% LAD, involving D2, borderline significant by IVUS, medical therapy. CFX, RCA OK.  Marland Kitchen CATARACT EXTRACTION    . CHOLECYSTECTOMY    . EYE SURGERY     retinal surgery  . SPINE SURGERY  Feb. 2011  . [place]   X's  several  . SUBCLAVIAN ARTERY STENT Left 09/20/2008   LSA ISR 7x39mm Cordis Genesis on Opta premount, reduction from 80% to 0%  . subclavian artery stents  01/23/2010   Left carotid to subclavian artery Bypass  . VISCERAL ANGIOGRAM  01/17/2014   Procedure: VISCERAL ANGIOGRAM;   Surgeon: Angelia Mould, MD;  Location: [facility] CATH LAB;  Service: Cardiovascular;;    OB History   No obstetric history on file.      Home Medications    Prior to Admission medications   Medication Sig Start Date End Date Taking? Authorizing Provider  aspirin EC 81 MG tablet Take 81 mg by mouth daily.   Yes [provider]  atenolol (TENORMIN) 25 MG tablet TAKE 1 TABLET DAILY Patient taking differently: Take 25 mg by mouth daily.  03/02/19  Yes Croitoru, Mihai, MD  atorvastatin (LIPITOR) 10 MG tablet TAKE 1 TABLET DAILY AT 6 P.M. 01/14/20  Yes Croitoru, Mihai, MD  candesartan (ATACAND) 16 MG tablet TAKE ONE-HALF (1/2) TABLET DAILY 01/14/20  Yes Croitoru, Mihai, MD  cholecalciferol (VITAMIN D) 1000 UNITS tablet Take 1,000 Units by mouth daily.   Yes [provider]  clopidogrel (PLAVIX) 75 MG tablet TAKE 1 TABLET DAILY 01/14/20  Yes Croitoru, Mihai, MD  clorazepate (TRANXENE) 7.5 MG tablet Take 1 tablet (7.5 mg total) by mouth daily as needed. 01/10/20  Yes Hali Marry, MD  cyanocobalamin 1000 MCG tablet Take 100 mcg by mouth daily.   Yes [provider]  esomeprazole (NEXIUM) 40 MG capsule Take 1 capsule (40 mg total) by mouth daily. Take 1 tab daily Patient taking differently: Take 40 mg by mouth daily.  02/23/15  Yes Croitoru, Mihai, MD  estradiol (ESTRACE) 0.5 MG tablet Take 1 tablet (0.5 mg total) by mouth daily. 03/29/19  Yes Gregor Hams, MD  furosemide (LASIX) 20 MG tablet TAKE 1 TABLET EVERY OTHER DAY 08/31/19  Yes Croitoru, Mihai, MD  linaclotide (LINZESS) 145 MCG CAPS capsule Take 1 capsule by mouth daily as needed (constipation).  12/22/15  Yes [provider]  meclizine (ANTIVERT) 25 MG tablet TAKE ONE-HALF (1/2) TABLET THREE TIMES A DAY AS NEEDED FOR DIZZINESS OR NAUSEA Patient taking differently: Take 12.5 mg by mouth 3 (three) times daily as needed for dizziness or nausea.  03/29/19  Yes Gregor Hams, MD  EPINEPHrine (EPIPEN 2-PAK)  0.3 mg/0.3 mL IJ SOAJ injection Inject 0.3 mLs (0.3 mg total) into the muscle as needed (for allergic reaction). 12/23/17   Gregor Hams, MD  levofloxacin (LEVAQUIN) 750 MG tablet Take 1 tablet (750 mg total) by mouth daily for 7 days. 02/15/20 02/22/20  Kandra Nicolas, MD  Multiple Vitamin (MULTIVITAMIN WITH MINERALS) TABS Take 1 tablet by mouth daily.    [provider]  zolpidem (AMBIEN) 5 MG tablet Take 1 tablet (5 mg total) by mouth at bedtime as needed for sleep. 11/01/19   Hali Marry, MD    Family History Family History  Problem Relation Age of Onset  . Heart disease Father 17       Heart Disease before age 50  . Kidney disease Father   . Heart attack Father   . Hyperlipidemia Father   . Hypertension Father   . Kidney failure Mother   . Diabetes Maternal Grandmother   . Heart disease Paternal Grandfather     Social History Social History   Tobacco Use  .  Smoking status: Former Smoker    Packs/day: 0.25    Years: 20.00    Pack years: 5.00    Types: Cigarettes    Quit date: 09/30/1993    Years since quitting: 26.3  . Smokeless tobacco: Never Used  Vaping Use  . Vaping Use: Never used  Substance Use Topics  . Alcohol use: No  . Drug use: No     Allergies   Cortisone, Dilaudid [hydromorphone hcl], Iodine, Medrol [methylprednisolone], Omnipaque [iohexol], Prednisone, Shellfish allergy, Sulfa drugs cross reactors, Doxycycline, Methylprednisolone sodium succ, Sulfa antibiotics, Augmentin [amoxicillin-pot clavulanate], Codeine, Erythromycin, and Morphine and related   Review of Systems Review of Systems  Constitutional: Positive for chills and fatigue. Negative for appetite change, diaphoresis and fever.  HENT: Negative.   Eyes: Negative.   Respiratory: Negative.   Cardiovascular: Negative.   Gastrointestinal: Positive for abdominal pain, constipation and nausea. Negative for abdominal distention, blood in stool, diarrhea and vomiting.    Genitourinary: Negative.   Musculoskeletal: Positive for myalgias.  Skin: Negative.   Neurological: Negative for headaches.     Physical Exam Triage Vital Signs ED Triage Vitals  Enc Vitals Group     BP 02/15/20 1019 (!) 144/86     Pulse Rate 02/15/20 1019 (!) 58     Resp 02/15/20 1019 16     Temp 02/15/20 1019 98.9 F (37.2 C)     Temp Source 02/15/20 1019 Oral     SpO2 02/15/20 1019 97 %     Weight 02/15/20 1021 153 lb 8 oz (69.6 kg)     Height --      Head Circumference --      Peak Flow --      Pain Score 02/15/20 1020 4     Pain Loc --      Pain Edu? --      Excl. in [place]? --    No data found.  Updated Vital Signs BP (!) 144/86 (BP Location: Right Arm)   Pulse (!) 58   Temp 98.9 F (37.2 C) (Oral)   Resp 16   Wt 69.6 kg Comment: weighed at home this am  SpO2 97%   BMI 27.19 kg/m   Visual Acuity Right Eye Distance:   Left Eye Distance:   Bilateral Distance:    Right Eye Near:   Left Eye Near:    Bilateral Near:     Physical Exam Vitals and nursing note reviewed.  Constitutional:      General: She is not in acute distress. HENT:     Head: Normocephalic.     Right Ear: External ear normal.     Left Ear: External ear normal.     Nose: Nose normal.     Mouth/Throat:     Mouth: Mucous membranes are moist.  Eyes:     Extraocular Movements: Extraocular movements intact.  Cardiovascular:     Rate and Rhythm: Normal rate and regular rhythm.     Heart sounds: Normal heart sounds.  Pulmonary:     Breath sounds: Normal breath sounds.  Abdominal:     General: Bowel sounds are increased.     Palpations: Abdomen is soft.     Tenderness: There is abdominal tenderness in the left upper quadrant. There is no right CVA tenderness, left CVA tenderness or guarding.    Musculoskeletal:     Cervical back: Neck supple.     Right lower leg: No edema.     Left lower leg: No edema.  Lymphadenopathy:  Cervical: No cervical adenopathy.  Skin:    General: Skin  is warm and dry.     Findings: No rash.  Neurological:     Mental Status: She is alert.      UC Treatments / Results  Labs (all labs ordered are listed, but only abnormal results are displayed) Labs Reviewed  POCT URINALYSIS DIP (MANUAL ENTRY) negative    EKG   Radiology DG Abd 2 Views  Result Date: 02/15/2020 CLINICAL DATA:  Left lower quadrant abdominal pain. EXAM: ABDOMEN - 2 VIEW COMPARISON:  March 29, 2019. FINDINGS: The bowel gas pattern is normal. There is no evidence of free air. No radio-opaque calculi or other significant radiographic abnormality is seen. IMPRESSION: Negative. Electronically Signed   By: Marijo Conception M.D.   On: 02/15/2020 11:43    Procedures Procedures (including critical care time)  Medications Ordered in UC Medications - No data to display  Initial Impression / Assessment and Plan / UC Course  I have reviewed the triage vital signs and the nursing notes.  Pertinent labs & imaging results that were available during my care of the patient were reviewed by me and considered in my medical decision making (see chart for details).    Suspect recurrent diverticulitis.  For now will begin Levaquin once daily (patient reports that she cannot tolerate Flagyl or Augmentin). Followup with Family Doctor in 23 hours.   Final Clinical Impressions(s) / UC Diagnoses   Final diagnoses:  Dysuria  Abdominal pain  Diverticulitis of colon     Discharge Instructions     Begin clear liquids for about 24 hours, then may begin a BRAT diet (Bananas, Rice, Applesauce, Toast) when pain has decreased.  Then gradually advance to a regular diet as tolerated.    May take Tylenol as needed for pain.  If symptoms become significantly worse during the night or over the weekend, proceed to the local emergency room.     ED Prescriptions    Medication Sig Dispense Auth. Provider   levofloxacin (LEVAQUIN) 750 MG tablet Take 1 tablet (750 mg total) by mouth daily  for 7 days. 7 tablet Kandra Nicolas, MD        Kandra Nicolas, MD 02/17/20 [phone]

## 2020-02-15 NOTE — ED Triage Notes (Signed)
C/o abdominal pain and bloating - started after midnight this morning Pfizer vaccine feb 2021 Chronic abdominal pain  Unable to get an appoint w/ PCP  C/o bladder pressure No OTC meds for bladder pressure

## 2020-02-17 ENCOUNTER — Encounter: Payer: Self-pay | Admitting: Family Medicine

## 2020-02-17 ENCOUNTER — Ambulatory Visit (INDEPENDENT_AMBULATORY_CARE_PROVIDER_SITE_OTHER): Payer: Medicare Other | Admitting: Family Medicine

## 2020-02-17 VITALS — BP 130/49 | HR 63 | Ht 63.0 in | Wt 153.5 lb

## 2020-02-17 DIAGNOSIS — I2510 Atherosclerotic heart disease of native coronary artery without angina pectoris: Secondary | ICD-10-CM | POA: Diagnosis not present

## 2020-02-17 DIAGNOSIS — K5792 Diverticulitis of intestine, part unspecified, without perforation or abscess without bleeding: Secondary | ICD-10-CM | POA: Diagnosis not present

## 2020-02-17 NOTE — Progress Notes (Signed)
Established Patient Office Visit  Subjective:  Patient ID: Traci [name], female    DOB: 16-Oct-1945  Age: 74 y.o. MRN: [id number]  CC:  Chief Complaint  Patient presents with  . Follow-up    HPI MAIZE BRITTINGHAM presents for follow-up from recent urgent care visit on August 31.  Day before she presented she started developed some left lower quadrant pain, with fatigue, myalgias, and chills.  She was diagnosed with suspected diverticulitis and treated with Levaquin and started on a liquid diet and to advance as tolerated.  Abdominal plain film was negative. She feels like her pain has lessened but the diarrhea.  She is tired of the liquid diet.    Reports had a diverticulitis flare back in 2019 and says this feels just like it.  She just feels like it is taking a little bit more for her to bounce back and recover.  Past Medical History:  Diagnosis Date  . Anxiety   . Atrial fibrillation ([place])   . CAD (coronary artery disease)   . Claudication ([place]) 01/06/2008   Lower extremity dopplers - no evidence of arterial insufficiency, normal exam  . Coronary artery disease   . GERD (gastroesophageal reflux disease)   . Hyperlipidemia   . Hypertension 11/30/2010   echo- EF 55%; normal w/ mildly sclerotic aortic valve  . Hypertension 11/05/11   renal dopplers - celiac artery and SMA >50% diameter reduction, R renal artery - mildly elevated velocities 1-59% diameter reduction, L renal artery normal  . Nonspecific ST-T wave electrocardiographic changes 03/26/2011   R/Lmv - EF 74%, normal perfusion all regions, ST depression w/ Lexiscan infusion w/o assoc angina  . Osteopenia   . PAD (peripheral artery disease) ([place])   . Peripheral vascular disease ([place])   . PVD (peripheral vascular disease) ([place]) 11/05/2011   doppler - R/L brachial pressures essentially equal w/o inflow disease; L sublclavian/CCA bypass graft demonstrates patent flow, no evidence of significant stenosis  .  Sigmoid diverticulitis     Past Surgical History:  Procedure Laterality Date  . ABDOMINAL AORTIC ANEURYSM REPAIR N/A 01/27/2014   Procedure: AORTO-SUPERIOR MESENTERIC ARTERY BYPASS GRAFT;  Surgeon: Angelia Mould, MD;  Location: [facility];  Service: Vascular;  Laterality: N/A;  . ABDOMINAL HYSTERECTOMY    . APPENDECTOMY    . CARDIAC CATHETERIZATION  06/03/2008   60% LAD, involving D2, borderline significant by IVUS, medical therapy. CFX, RCA OK.  Marland Kitchen CATARACT EXTRACTION    . CHOLECYSTECTOMY    . EYE SURGERY     retinal surgery  . SPINE SURGERY  Feb. 2011  . [place]   X's  several  . SUBCLAVIAN ARTERY STENT Left 09/20/2008   LSA ISR 7x49mm Cordis Genesis on Opta premount, reduction from 80% to 0%  . subclavian artery stents  01/23/2010   Left carotid to subclavian artery Bypass  . VISCERAL ANGIOGRAM  01/17/2014   Procedure: VISCERAL ANGIOGRAM;  Surgeon: Angelia Mould, MD;  Location: [facility] CATH LAB;  Service: Cardiovascular;;    Family History  Problem Relation Age of Onset  . Heart disease Father 51       Heart Disease before age 67  . Kidney disease Father   . Heart attack Father   . Hyperlipidemia Father   . Hypertension Father   . Kidney failure Mother   . Diabetes Maternal Grandmother   . Heart disease Paternal Grandfather     Social History   Socioeconomic History  . Marital  status: Widowed    Spouse name: Not on file  . Number of children: 1  . Years of education: 99  . Highest education level: Bachelor's degree (e.g., BA, AB, BS)  Occupational History  . Occupation: retired    Comment: Marine scientist  Tobacco Use  . Smoking status: Former Smoker    Packs/day: 0.25    Years: 20.00    Pack years: 5.00    Types: Cigarettes    Quit date: 09/30/1993    Years since quitting: 26.4  . Smokeless tobacco: Never Used  Vaping Use  . Vaping Use: Never used  Substance and Sexual Activity  . Alcohol use: No  . Drug use: No  . Sexual activity: Not  Currently  Other Topics Concern  . 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 No caffeine use.   Social Determinants of Health   Financial Resource Strain:   . Difficulty of Paying Living Expenses: Not on file  Food Insecurity:   . Worried About Charity fundraiser in the Last Year: Not on file  . Ran Out of Food in the Last Year: Not on file  Transportation Needs:   . Lack of Transportation (Medical): Not on file  . Lack of Transportation (Non-Medical): Not on file  Physical Activity:   . Days of Exercise per Week: Not on file  . Minutes of Exercise per Session: Not on file  Stress:   . Feeling of Stress : Not on file  Social Connections:   . Frequency of Communication with Friends and Family: Not on file  . Frequency of Social Gatherings with Friends and Family: Not on file  . Attends Religious Services: Not on file  . Active Member of Clubs or Organizations: Not on file  . Attends Archivist Meetings: Not on file  . Marital Status: Not on file  Intimate Partner Violence:   . Fear of Current or Ex-Partner: Not on file  . Emotionally Abused: Not on file  . Physically Abused: Not on file  . Sexually Abused: Not on file    Outpatient Medications Prior to Visit  Medication Sig Dispense Refill  . aspirin EC 81 MG tablet Take 81 mg by mouth daily.    Marland Kitchen atenolol (TENORMIN) 25 MG tablet TAKE 1 TABLET DAILY (Patient taking differently: Take 25 mg by mouth daily. ) 90 tablet 3  . atorvastatin (LIPITOR) 10 MG tablet TAKE 1 TABLET DAILY AT 6 P.M. 90 tablet 1  . candesartan (ATACAND) 16 MG tablet TAKE ONE-HALF (1/2) TABLET DAILY 45 tablet 1  . cholecalciferol (VITAMIN D) 1000 UNITS tablet Take 1,000 Units by mouth daily.    . clopidogrel (PLAVIX) 75 MG tablet TAKE 1 TABLET DAILY 90 tablet 1  . clorazepate (TRANXENE) 7.5 MG tablet Take 1 tablet (7.5 mg total) by mouth daily as needed. 90 tablet 0  . cyanocobalamin 1000 MCG  tablet Take 100 mcg by mouth daily.    Marland Kitchen EPINEPHrine (EPIPEN 2-PAK) 0.3 mg/0.3 mL IJ SOAJ injection Inject 0.3 mLs (0.3 mg total) into the muscle as needed (for allergic reaction). 2 Device 0  . esomeprazole (NEXIUM) 40 MG capsule Take 1 capsule (40 mg total) by mouth daily. Take 1 tab daily (Patient taking differently: Take 40 mg by mouth daily. ) 90 capsule 0  . estradiol (ESTRACE) 0.5 MG tablet Take 1 tablet (0.5 mg total) by mouth daily. 90 tablet 3  . furosemide (LASIX) 20  MG tablet TAKE 1 TABLET EVERY OTHER DAY 45 tablet 3  . levofloxacin (LEVAQUIN) 750 MG tablet Take 1 tablet (750 mg total) by mouth daily for 7 days. 7 tablet 0  . linaclotide (LINZESS) 145 MCG CAPS capsule Take 1 capsule by mouth daily as needed (constipation).     . meclizine (ANTIVERT) 25 MG tablet TAKE ONE-HALF (1/2) TABLET THREE TIMES A DAY AS NEEDED FOR DIZZINESS OR NAUSEA (Patient taking differently: Take 12.5 mg by mouth 3 (three) times daily as needed for dizziness or nausea. ) 90 tablet 6  . Multiple Vitamin (MULTIVITAMIN WITH MINERALS) TABS Take 1 tablet by mouth daily.    Marland Kitchen zolpidem (AMBIEN) 5 MG tablet Take 1 tablet (5 mg total) by mouth at bedtime as needed for sleep. 90 tablet 0   No facility-administered medications prior to visit.    Allergies  Allergen Reactions  . Cortisone Anaphylaxis  . Dilaudid [Hydromorphone Hcl] Nausea And Vomiting    Pt states she will start vomiting immediately for 6 hours  . Iodine Anaphylaxis  . Medrol [Methylprednisolone] Anaphylaxis  . Omnipaque [Iohexol] Anaphylaxis  . Prednisone Anaphylaxis and Swelling  . Shellfish Allergy Anaphylaxis  . Sulfa Drugs Cross Reactors Anaphylaxis  . Doxycycline     Thrush/itching  . Methylprednisolone Sodium Succ Swelling    Blood pressure drops immediately  . Sulfa Antibiotics Other (See Comments)    Stomach upset  . Augmentin [Amoxicillin-Pot Clavulanate] Nausea Only    Upset stomach (Has taken cephalexin in the past without  problems)  . Codeine Rash and Other (See Comments)    Upset stomach  . Erythromycin Rash  . Morphine And Related Nausea Only    nausea    ROS Review of Systems    Objective:    Physical Exam Constitutional:      Appearance: She is well-developed.  HENT:     Head: Normocephalic and atraumatic.  Cardiovascular:     Rate and Rhythm: Normal rate and regular rhythm.     Heart sounds: Normal heart sounds.  Pulmonary:     Effort: Pulmonary effort is normal.     Breath sounds: Normal breath sounds.  Abdominal:     General: Abdomen is flat. Bowel sounds are normal. There is no distension.     Palpations: Abdomen is soft. There is no mass.     Tenderness: There is abdominal tenderness.     Comments: Mild tenderness in the right lower quadrant, epigastric area and left lower quadrant.  Skin:    General: Skin is warm and dry.  Neurological:     Mental Status: She is alert and oriented to person, place, and time.  Psychiatric:        Behavior: Behavior normal.     BP (!) 130/49   Pulse 63   Ht 5\' 3"  (1.6 m)   Wt 153 lb 8 oz (69.6 kg)   SpO2 97%   BMI 27.19 kg/m  Wt Readings from Last 3 Encounters:  02/17/20 153 lb 8 oz (69.6 kg)  02/15/20 153 lb 8 oz (69.6 kg)  02/07/20 155 lb (70.3 kg)     There are no preventive care reminders to display for this patient.  There are no preventive care reminders to display for this patient.  Lab Results  Component Value Date   TSH 1.38 09/03/2019   Lab Results  Component Value Date   WBC 4.5 02/07/2020   HGB 12.5 02/07/2020   HCT 36.7 02/07/2020   MCV 88.6 02/07/2020  PLT 310 02/07/2020   Lab Results  Component Value Date   NA 140 09/09/2019   K 3.9 09/09/2019   CO2 25 09/09/2019   GLUCOSE 113 (H) 09/09/2019   BUN 17 09/09/2019   CREATININE 0.83 09/09/2019   BILITOT 0.6 09/03/2019   ALKPHOS 58 02/17/2019   AST 26 09/03/2019   ALT 21 09/03/2019   PROT 7.8 09/03/2019   ALBUMIN 4.3 02/17/2019   CALCIUM 8.5 (L)  09/09/2019   ANIONGAP 10 09/09/2019   Lab Results  Component Value Date   CHOL 129 02/17/2019   Lab Results  Component Value Date   HDL 47 02/17/2019   Lab Results  Component Value Date   LDLCALC 53 02/17/2019   Lab Results  Component Value Date   TRIG 178 (H) 02/17/2019   Lab Results  Component Value Date   CHOLHDL 2.7 02/17/2019   No results found for: HGBA1C    Assessment & Plan:   Problem List Items Addressed This Visit    None    Visit Diagnoses    Diverticulitis    -  Primary     Make Sure to complete antibiotics.  Okay to start pushing the diet to include things like toast, rice and maybe bananas.  Avoid dairy products or meat for at least a couple more days.  Call if you develop any worsening abdominal pain, fever or chills or see blood in the stool.  No orders of the defined types were placed in this encounter.   Follow-up: Return if symptoms worsen or fail to improve.   Spent 21 minutes in encounter.  Beatrice Lecher, MD

## 2020-02-17 NOTE — Progress Notes (Signed)
She was seen at [facility] on 02/15/2020 for abdominal pain,dysuria.  She stated that she had felt like she was catching fllu earlier in the week.   She has not had a flare up of diverticulitis since 2019.  Her last episode of diarrhea was this morning and it was watery.

## 2020-02-17 NOTE — Patient Instructions (Signed)
Sure to complete antibiotics.  Okay to start pushing the diet to include things like toast, rice and maybe bananas.  Avoid dairy products or meat for at least a couple more days.  Call if you develop any worsening abdominal pain, fever or chills or see blood in the stool.

## 2020-02-23 DIAGNOSIS — M5432 Sciatica, left side: Secondary | ICD-10-CM | POA: Diagnosis not present

## 2020-02-23 DIAGNOSIS — M545 Low back pain: Secondary | ICD-10-CM | POA: Diagnosis not present

## 2020-02-23 DIAGNOSIS — M5431 Sciatica, right side: Secondary | ICD-10-CM | POA: Diagnosis not present

## 2020-02-25 ENCOUNTER — Other Ambulatory Visit: Payer: Self-pay | Admitting: Cardiovascular Disease

## 2020-02-25 DIAGNOSIS — M5431 Sciatica, right side: Secondary | ICD-10-CM | POA: Diagnosis not present

## 2020-02-25 DIAGNOSIS — M545 Low back pain: Secondary | ICD-10-CM | POA: Diagnosis not present

## 2020-02-25 DIAGNOSIS — M5432 Sciatica, left side: Secondary | ICD-10-CM | POA: Diagnosis not present

## 2020-02-28 ENCOUNTER — Encounter: Payer: Self-pay | Admitting: Family Medicine

## 2020-02-28 ENCOUNTER — Ambulatory Visit (INDEPENDENT_AMBULATORY_CARE_PROVIDER_SITE_OTHER): Payer: Medicare Other | Admitting: Family Medicine

## 2020-02-28 VITALS — BP 126/43 | HR 62 | Ht 63.0 in | Wt 151.5 lb

## 2020-02-28 DIAGNOSIS — G4700 Insomnia, unspecified: Secondary | ICD-10-CM

## 2020-02-28 DIAGNOSIS — I10 Essential (primary) hypertension: Secondary | ICD-10-CM | POA: Diagnosis not present

## 2020-02-28 DIAGNOSIS — I2510 Atherosclerotic heart disease of native coronary artery without angina pectoris: Secondary | ICD-10-CM

## 2020-02-28 DIAGNOSIS — N1831 Chronic kidney disease, stage 3a: Secondary | ICD-10-CM

## 2020-02-28 DIAGNOSIS — K5792 Diverticulitis of intestine, part unspecified, without perforation or abscess without bleeding: Secondary | ICD-10-CM

## 2020-02-28 DIAGNOSIS — Z23 Encounter for immunization: Secondary | ICD-10-CM

## 2020-02-28 DIAGNOSIS — I499 Cardiac arrhythmia, unspecified: Secondary | ICD-10-CM | POA: Diagnosis not present

## 2020-02-28 MED ORDER — ESZOPICLONE 2 MG PO TABS: 2.0000 mg | ORAL_TABLET | Freq: Every evening | ORAL | 0 refills | Status: DC | PRN

## 2020-02-28 NOTE — Progress Notes (Signed)
Established Patient Office Visit  Subjective:  Patient ID: Traci [name], female    DOB: Nov 10, 1945  Age: 74 y.o. MRN: [id number]  CC:  Chief Complaint  Patient presents with  . Hypertension    HPI Traci [name] [place] presents for   Insomnia-she says the Ambien really just did not seem to help or touch her sleep at all.  She can usually fall asleep that after a few hours wakes up and is just wide-awake.  She did try 5 mg of Ambien.  She did have 2 friends recently passed away over the summer but does not feel like that is negatively impacting her sleep.  Diverticulitis from recent visit she is actually feeling much better and has been slowly advancing her diet and doing well thus far though she is not completely back to baseline.  Also here to follow-up for chronic kidney disease.  Last creatinine was in March 2021.  Checked BP over the weekend and monitor pick up irregular HR. she still having erratic spikes in her blood pressure for no reason.  This time over the weekend it was 179/76.  And her machine kept telling her that she was having an irregular heart rate.  This happens occasionally.  Though it said her pulse was still around 55.  No CP or palps or SOB at that time.  He says she never feels bad or feels like her heart is racing when the machine says this but she is a little concerned.  Past Medical History:  Diagnosis Date  . Anxiety   . Atrial fibrillation ([place])   . CAD (coronary artery disease)   . Claudication ([place]) 01/06/2008   Lower extremity dopplers - no evidence of arterial insufficiency, normal exam  . Coronary artery disease   . GERD (gastroesophageal reflux disease)   . Hyperlipidemia   . Hypertension 11/30/2010   echo- EF 55%; normal w/ mildly sclerotic aortic valve  . Hypertension 11/05/11   renal dopplers - celiac artery and SMA >50% diameter reduction, R renal artery - mildly elevated velocities 1-59% diameter reduction, L renal artery  normal  . Nonspecific ST-T wave electrocardiographic changes 03/26/2011   R/Lmv - EF 74%, normal perfusion all regions, ST depression w/ Lexiscan infusion w/o assoc angina  . Osteopenia   . PAD (peripheral artery disease) ([place])   . Peripheral vascular disease ([place])   . PVD (peripheral vascular disease) ([place]) 11/05/2011   doppler - R/L brachial pressures essentially equal w/o inflow disease; L sublclavian/CCA bypass graft demonstrates patent flow, no evidence of significant stenosis  . Sigmoid diverticulitis     Past Surgical History:  Procedure Laterality Date  . ABDOMINAL AORTIC ANEURYSM REPAIR N/A 01/27/2014   Procedure: AORTO-SUPERIOR MESENTERIC ARTERY BYPASS GRAFT;  Surgeon: Angelia Mould, MD;  Location: [facility];  Service: Vascular;  Laterality: N/A;  . ABDOMINAL HYSTERECTOMY    . APPENDECTOMY    . CARDIAC CATHETERIZATION  06/03/2008   60% LAD, involving D2, borderline significant by IVUS, medical therapy. CFX, RCA OK.  Marland Kitchen CATARACT EXTRACTION    . CHOLECYSTECTOMY    . EYE SURGERY     retinal surgery  . SPINE SURGERY  Feb. 2011  . [place]   X's  several  . SUBCLAVIAN ARTERY STENT Left 09/20/2008   LSA ISR 7x98mm Cordis Genesis on Opta premount, reduction from 80% to 0%  . subclavian artery stents  01/23/2010   Left carotid to subclavian artery Bypass  . VISCERAL ANGIOGRAM  01/17/2014   Procedure: VISCERAL ANGIOGRAM;  Surgeon: Angelia Mould, MD;  Location: [facility] C[facility] LAB;  Service: Cardiovascular;;    Family History  Problem Relation Age of Onset  . Heart disease Father 27       Heart Disease before age 36  . Kidney disease Father   . Heart attack Father   . Hyperlipidemia Father   . Hypertension Father   . Kidney failure Mother   . Diabetes Maternal Grandmother   . Heart disease Paternal Grandfather     Social History   Socioeconomic History  . Marital status: Widowed    Spouse name: Not on file  . Number of children: 1  . Years of  education: 48  . Highest education level: Bachelor's degree (e.g., BA, AB, BS)  Occupational History  . Occupation: retired    Comment: Marine scientist  Tobacco Use  . Smoking status: Former Smoker    Packs/day: 0.25    Years: 20.00    Pack years: 5.00    Types: Cigarettes    Quit date: 09/30/1993    Years since quitting: 26.4  . Smokeless tobacco: Never Used  Vaping Use  . Vaping Use: Never used  Substance and Sexual Activity  . Alcohol use: No  . Drug use: No  . Sexual activity: Not Currently  Other Topics Concern  . 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 No caffeine use.   Social Determinants of Health   Financial Resource Strain:   . Difficulty of Paying Living Expenses: Not on file  Food Insecurity:   . Worried About Charity fundraiser in the Last Year: Not on file  . Ran Out of Food in the Last Year: Not on file  Transportation Needs:   . Lack of Transportation (Medical): Not on file  . Lack of Transportation (Non-Medical): Not on file  Physical Activity:   . Days of Exercise per Week: Not on file  . Minutes of Exercise per Session: Not on file  Stress:   . Feeling of Stress : Not on file  Social Connections:   . Frequency of Communication with Friends and Family: Not on file  . Frequency of Social Gatherings with Friends and Family: Not on file  . Attends Religious Services: Not on file  . Active Member of Clubs or Organizations: Not on file  . Attends Archivist Meetings: Not on file  . Marital Status: Not on file  Intimate Partner Violence:   . Fear of Current or Ex-Partner: Not on file  . Emotionally Abused: Not on file  . Physically Abused: Not on file  . Sexually Abused: Not on file    Outpatient Medications Prior to Visit  Medication Sig Dispense Refill  . aspirin EC 81 MG tablet Take 81 mg by mouth daily.    Marland Kitchen atenolol (TENORMIN) 25 MG tablet TAKE 1 TABLET DAILY 90 tablet 3  . atorvastatin  (LIPITOR) 10 MG tablet TAKE 1 TABLET DAILY AT 6 P.M. 90 tablet 1  . candesartan (ATACAND) 16 MG tablet TAKE ONE-HALF (1/2) TABLET DAILY 45 tablet 1  . cholecalciferol (VITAMIN D) 1000 UNITS tablet Take 1,000 Units by mouth daily.    . clopidogrel (PLAVIX) 75 MG tablet TAKE 1 TABLET DAILY 90 tablet 1  . clorazepate (TRANXENE) 7.5 MG tablet Take 1 tablet (7.5 mg total) by mouth daily as needed. 90 tablet 0  . cyanocobalamin 1000 MCG tablet Take 100  mcg by mouth daily.    Marland Kitchen EPINEPHrine (EPIPEN 2-PAK) 0.3 mg/0.3 mL IJ SOAJ injection Inject 0.3 mLs (0.3 mg total) into the muscle as needed (for allergic reaction). 2 Device 0  . esomeprazole (NEXIUM) 40 MG capsule Take 1 capsule (40 mg total) by mouth daily. Take 1 tab daily (Patient taking differently: Take 40 mg by mouth daily. ) 90 capsule 0  . estradiol (ESTRACE) 0.5 MG tablet Take 1 tablet (0.5 mg total) by mouth daily. 90 tablet 3  . furosemide (LASIX) 20 MG tablet TAKE 1 TABLET EVERY OTHER DAY 45 tablet 3  . linaclotide (LINZESS) 145 MCG CAPS capsule Take 1 capsule by mouth daily as needed (constipation).     . meclizine (ANTIVERT) 25 MG tablet TAKE ONE-HALF (1/2) TABLET THREE TIMES A DAY AS NEEDED FOR DIZZINESS OR NAUSEA (Patient taking differently: Take 12.5 mg by mouth 3 (three) times daily as needed for dizziness or nausea. ) 90 tablet 6  . Multiple Vitamin (MULTIVITAMIN WITH MINERALS) TABS Take 1 tablet by mouth daily.    Marland Kitchen zolpidem (AMBIEN) 5 MG tablet Take 1 tablet (5 mg total) by mouth at bedtime as needed for sleep. 90 tablet 0   No facility-administered medications prior to visit.    Allergies  Allergen Reactions  . Cortisone Anaphylaxis  . Dilaudid [Hydromorphone Hcl] Nausea And Vomiting    Pt states she will start vomiting immediately for 6 hours  . Iodine Anaphylaxis  . Medrol [Methylprednisolone] Anaphylaxis  . Omnipaque [Iohexol] Anaphylaxis  . Prednisone Anaphylaxis and Swelling  . Shellfish Allergy Anaphylaxis  . Sulfa  Drugs Cross Reactors Anaphylaxis  . Doxycycline     Thrush/itching  . Methylprednisolone Sodium Succ Swelling    Blood pressure drops immediately  . Sulfa Antibiotics Other (See Comments)    Stomach upset  . Augmentin [Amoxicillin-Pot Clavulanate] Nausea Only    Upset stomach (Has taken cephalexin in the past without problems)  . Codeine Rash and Other (See Comments)    Upset stomach  . Erythromycin Rash  . Morphine And Related Nausea Only    nausea    ROS Review of Systems    Objective:    Physical Exam Constitutional:      Appearance: She is well-developed.  HENT:     Head: Normocephalic and atraumatic.  Cardiovascular:     Rate and Rhythm: Normal rate and regular rhythm.     Heart sounds: Normal heart sounds.  Pulmonary:     Effort: Pulmonary effort is normal.     Breath sounds: Normal breath sounds.  Skin:    General: Skin is warm and dry.  Neurological:     Mental Status: She is alert and oriented to person, place, and time.  Psychiatric:        Behavior: Behavior normal.     BP (!) 126/43   Pulse 62   Ht 5\' 3"  (1.6 m)   Wt 151 lb 8 oz (68.7 kg)   SpO2 100%   BMI 26.84 kg/m  Wt Readings from Last 3 Encounters:  02/28/20 151 lb 8 oz (68.7 kg)  02/17/20 153 lb 8 oz (69.6 kg)  02/15/20 153 lb 8 oz (69.6 kg)     There are no preventive care reminders to display for this patient.  There are no preventive care reminders to display for this patient.  Lab Results  Component Value Date   TSH 1.38 09/03/2019   Lab Results  Component Value Date   WBC 4.5 02/07/2020  HGB 12.5 02/07/2020   HCT 36.7 02/07/2020   MCV 88.6 02/07/2020   PLT 310 02/07/2020   Lab Results  Component Value Date   NA 140 09/09/2019   K 3.9 09/09/2019   CO2 25 09/09/2019   GLUCOSE 113 (H) 09/09/2019   BUN 17 09/09/2019   CREATININE 0.83 09/09/2019   BILITOT 0.6 09/03/2019   ALKPHOS 58 02/17/2019   AST 26 09/03/2019   ALT 21 09/03/2019   PROT 7.8 09/03/2019    ALBUMIN 4.3 02/17/2019   CALCIUM 8.5 (L) 09/09/2019   ANIONGAP 10 09/09/2019   Lab Results  Component Value Date   CHOL 129 02/17/2019   Lab Results  Component Value Date   HDL 47 02/17/2019   Lab Results  Component Value Date   LDLCALC 53 02/17/2019   Lab Results  Component Value Date   TRIG 178 (H) 02/17/2019   Lab Results  Component Value Date   CHOLHDL 2.7 02/17/2019   No results found for: HGBA1C    Assessment & Plan:   Problem List Items Addressed This Visit      Cardiovascular and Mediastinum   Hypertension (Chronic)    Pressure at goal.  Looks phenomenal.      Relevant Orders   Lipid Panel w/reflex Direct LDL   COMPLETE METABOLIC PANEL WITH GFR   CBC     Genitourinary   CKD (chronic kidney disease) stage 3, GFR 30-59 ml/min - Primary    Following renal function every 6 months.      Relevant Orders   Lipid Panel w/reflex Direct LDL   COMPLETE METABOLIC PANEL WITH GFR   CBC     Other   Insomnia    Discussed options.  The Ambien 5 mg was not helpful we discussed that 10 mg is not FDA approved for women or those over 65.  Not sure what is actually triggering this insomnia and why it is not responding to her typical sleep aid she does seem like her mood is actually fairly well controlled.  We can do a trial of Lunesta and see if this is helpful.      Relevant Medications   eszopiclone (LUNESTA) 2 MG TABS tablet    Other Visit Diagnoses    Need for immunization against influenza       Relevant Orders   Flu Vaccine QUAD High Dose(Fluad) (Completed)   Diverticulitis       Irregular heart rhythm         Diverticulitis-significantly improved.  Able to eat some rice and chicken now.  Irregular heart rhythm-her home blood pressure monitor is picking this up but she is asymptomatic at the time so it is difficult to say if this just could be an error with her machine or if it really is picking up on something as it often happens when her blood pressure  is reading particularly high.  This is a little unusual and I think it might be worth a cardiac monitor for at least a couple of weeks just to rule out any type of underlying arrhythmia.  She has a follow-up with cardiology in about a month and so she will discuss it with them if she feels like they are becoming more frequent then she will give me a call and let me know and we can get that ordered.  Meds ordered this encounter  Medications  . eszopiclone (LUNESTA) 2 MG TABS tablet    Sig: Take 1 tablet (2 mg total) by mouth  at bedtime as needed for sleep. Take immediately before bedtime    Dispense:  30 tablet    Refill:  0    Follow-up: Return in about 6 months (around 08/27/2020) for BP and CKD.    Beatrice Lecher, MD

## 2020-02-28 NOTE — Assessment & Plan Note (Signed)
Pressure at goal.  Looks phenomenal.

## 2020-02-28 NOTE — Assessment & Plan Note (Addendum)
Following renal function every 6 months. 

## 2020-02-28 NOTE — Assessment & Plan Note (Signed)
Discussed options.  The Ambien 5 mg was not helpful we discussed that 10 mg is not FDA approved for women or those over 65.  Not sure what is actually triggering this insomnia and why it is not responding to her typical sleep aid she does seem like her mood is actually fairly well controlled.  We can do a trial of Lunesta and see if this is helpful.

## 2020-02-29 ENCOUNTER — Other Ambulatory Visit: Payer: Self-pay | Admitting: Family Medicine

## 2020-02-29 DIAGNOSIS — I10 Essential (primary) hypertension: Secondary | ICD-10-CM | POA: Diagnosis not present

## 2020-02-29 DIAGNOSIS — N1831 Chronic kidney disease, stage 3a: Secondary | ICD-10-CM | POA: Diagnosis not present

## 2020-02-29 LAB — LIPID PANEL W/REFLEX DIRECT LDL
Cholesterol: 122 mg/dL (ref <200)
HDL: 47 mg/dL — ABNORMAL LOW (ref >=50)
LDL Cholesterol (Calc): 52 mg/dL (calc)
Non-HDL Cholesterol (Calc): 75 mg/dL (calc) (ref <130)
Total CHOL/HDL Ratio: 2.6 (calc) (ref <5.0)
Triglycerides: 145 mg/dL (ref <150)

## 2020-02-29 LAB — COMPLETE METABOLIC PANEL WITH GFR
AG Ratio: 1.4 (calc) (ref 1.0–2.5)
ALT: 13 U/L (ref 6–29)
AST: 20 U/L (ref 10–35)
Albumin: 3.8 g/dL (ref 3.6–5.1)
Alkaline phosphatase (APISO): 49 U/L (ref 37–153)
BUN: 9 mg/dL (ref 7–25)
CO2: 28 mmol/L (ref 20–32)
Calcium: 8.5 mg/dL — ABNORMAL LOW (ref 8.6–10.4)
Chloride: 103 mmol/L (ref 98–110)
Creat: 0.83 mg/dL (ref 0.60–0.93)
GFR, Est African American: 81 mL/min/{1.73_m2} (ref >=60)
GFR, Est Non African American: 70 mL/min/{1.73_m2} (ref >=60)
Globulin: 2.7 g/dL (calc) (ref 1.9–3.7)
Glucose, Bld: 91 mg/dL (ref 65–99)
Potassium: 4.5 mmol/L (ref 3.5–5.3)
Sodium: 137 mmol/L (ref 135–146)
Total Bilirubin: 0.5 mg/dL (ref 0.2–1.2)
Total Protein: 6.5 g/dL (ref 6.1–8.1)

## 2020-03-01 ENCOUNTER — Encounter: Payer: Self-pay | Admitting: Family Medicine

## 2020-03-02 DIAGNOSIS — M5431 Sciatica, right side: Secondary | ICD-10-CM | POA: Diagnosis not present

## 2020-03-02 DIAGNOSIS — M545 Low back pain: Secondary | ICD-10-CM | POA: Diagnosis not present

## 2020-03-02 DIAGNOSIS — M5432 Sciatica, left side: Secondary | ICD-10-CM | POA: Diagnosis not present

## 2020-03-07 DIAGNOSIS — Z79899 Other long term (current) drug therapy: Secondary | ICD-10-CM | POA: Diagnosis not present

## 2020-03-07 DIAGNOSIS — E785 Hyperlipidemia, unspecified: Secondary | ICD-10-CM | POA: Diagnosis not present

## 2020-03-07 DIAGNOSIS — K3189 Other diseases of stomach and duodenum: Secondary | ICD-10-CM | POA: Diagnosis not present

## 2020-03-07 DIAGNOSIS — K2970 Gastritis, unspecified, without bleeding: Secondary | ICD-10-CM | POA: Diagnosis not present

## 2020-03-07 DIAGNOSIS — R109 Unspecified abdominal pain: Secondary | ICD-10-CM | POA: Diagnosis not present

## 2020-03-07 DIAGNOSIS — K5790 Diverticulosis of intestine, part unspecified, without perforation or abscess without bleeding: Secondary | ICD-10-CM | POA: Diagnosis not present

## 2020-03-07 DIAGNOSIS — Z7982 Long term (current) use of aspirin: Secondary | ICD-10-CM | POA: Diagnosis not present

## 2020-03-07 DIAGNOSIS — K2900 Acute gastritis without bleeding: Secondary | ICD-10-CM | POA: Diagnosis not present

## 2020-03-07 DIAGNOSIS — I2510 Atherosclerotic heart disease of native coronary artery without angina pectoris: Secondary | ICD-10-CM | POA: Diagnosis not present

## 2020-03-07 DIAGNOSIS — Z888 Allergy status to other drugs, medicaments and biological substances status: Secondary | ICD-10-CM | POA: Diagnosis not present

## 2020-03-07 DIAGNOSIS — K219 Gastro-esophageal reflux disease without esophagitis: Secondary | ICD-10-CM | POA: Diagnosis not present

## 2020-03-07 DIAGNOSIS — Z886 Allergy status to analgesic agent status: Secondary | ICD-10-CM | POA: Diagnosis not present

## 2020-03-07 DIAGNOSIS — I10 Essential (primary) hypertension: Secondary | ICD-10-CM | POA: Diagnosis not present

## 2020-03-07 DIAGNOSIS — Z881 Allergy status to other antibiotic agents status: Secondary | ICD-10-CM | POA: Diagnosis not present

## 2020-03-07 DIAGNOSIS — Z87891 Personal history of nicotine dependence: Secondary | ICD-10-CM | POA: Diagnosis not present

## 2020-03-07 DIAGNOSIS — I4891 Unspecified atrial fibrillation: Secondary | ICD-10-CM | POA: Diagnosis not present

## 2020-03-07 DIAGNOSIS — K449 Diaphragmatic hernia without obstruction or gangrene: Secondary | ICD-10-CM | POA: Diagnosis not present

## 2020-03-09 ENCOUNTER — Encounter: Payer: Self-pay | Admitting: Family Medicine

## 2020-03-09 DIAGNOSIS — I739 Peripheral vascular disease, unspecified: Secondary | ICD-10-CM

## 2020-03-09 DIAGNOSIS — M810 Age-related osteoporosis without current pathological fracture: Secondary | ICD-10-CM

## 2020-03-13 ENCOUNTER — Telehealth: Payer: Self-pay | Admitting: Family Medicine

## 2020-03-13 DIAGNOSIS — I499 Cardiac arrhythmia, unspecified: Secondary | ICD-10-CM

## 2020-03-13 NOTE — Telephone Encounter (Signed)
ziopatch ordered

## 2020-03-14 DIAGNOSIS — H35371 Puckering of macula, right eye: Secondary | ICD-10-CM | POA: Diagnosis not present

## 2020-03-14 DIAGNOSIS — H43391 Other vitreous opacities, right eye: Secondary | ICD-10-CM | POA: Diagnosis not present

## 2020-03-14 DIAGNOSIS — H43813 Vitreous degeneration, bilateral: Secondary | ICD-10-CM | POA: Diagnosis not present

## 2020-03-14 DIAGNOSIS — H35431 Paving stone degeneration of retina, right eye: Secondary | ICD-10-CM | POA: Diagnosis not present

## 2020-03-14 NOTE — Telephone Encounter (Signed)
Hi Dr. Doren Custard. I wanted you to be aware of diabetes blood pressures and the difference between her right and left arms. It looks like her carotid Doppler is up-to-date but I was concerned about the possibility of steal syndrome.  since you have been following her regularly I just wanted to get your thoughts if she needs additional work-up or not. Thank you so much!!  Beatrice Lecher, MD

## 2020-03-15 DIAGNOSIS — Z23 Encounter for immunization: Secondary | ICD-10-CM | POA: Diagnosis not present

## 2020-03-15 NOTE — Addendum Note (Signed)
Addended by: Mertha Finders on: 03/15/2020 09:47 AM   Modules accepted: Orders

## 2020-03-15 NOTE — Telephone Encounter (Signed)
Bone density referral pended.

## 2020-03-16 ENCOUNTER — Encounter: Payer: Self-pay | Admitting: Family Medicine

## 2020-03-16 NOTE — Addendum Note (Signed)
Addended by: Mertha Finders on: 03/16/2020 03:51 PM   Modules accepted: Orders

## 2020-03-17 ENCOUNTER — Ambulatory Visit (INDEPENDENT_AMBULATORY_CARE_PROVIDER_SITE_OTHER): Payer: Medicare Other

## 2020-03-17 DIAGNOSIS — I499 Cardiac arrhythmia, unspecified: Secondary | ICD-10-CM

## 2020-03-19 DIAGNOSIS — I499 Cardiac arrhythmia, unspecified: Secondary | ICD-10-CM | POA: Diagnosis not present

## 2020-03-19 DIAGNOSIS — R002 Palpitations: Secondary | ICD-10-CM | POA: Diagnosis not present

## 2020-03-22 ENCOUNTER — Encounter: Payer: Self-pay | Admitting: Family Medicine

## 2020-03-23 DIAGNOSIS — R1011 Right upper quadrant pain: Secondary | ICD-10-CM | POA: Diagnosis not present

## 2020-03-23 DIAGNOSIS — K559 Vascular disorder of intestine, unspecified: Secondary | ICD-10-CM | POA: Diagnosis not present

## 2020-03-23 DIAGNOSIS — K5904 Chronic idiopathic constipation: Secondary | ICD-10-CM | POA: Diagnosis not present

## 2020-03-24 ENCOUNTER — Telehealth: Payer: Self-pay | Admitting: Cardiovascular Disease

## 2020-03-24 NOTE — Telephone Encounter (Signed)
Spoke with [employer] let them know that 6 days was enough and that the patient did not need another monitor. Verbalized understanding.

## 2020-03-24 NOTE — Telephone Encounter (Signed)
Patient has returned her event monitor today, only had it on for 6 days. It is rapidly flashing it's fault condition it won't stop flashing. REF # [id number]

## 2020-03-24 NOTE — Telephone Encounter (Signed)
Spoke with [employer] who states that patients monitor malfunctioned after the patient wore it for 6 days and patient sent the monitor back today. [employer] would like to know if they need to send a replacement monitor back to patient, or is 6 days worth of information enough for the Dr. To review. Advised [employer] that I would forward message to Dr. Sallyanne Kuster and see if he thinks 6 days is enough information if not [employer] stated that we can call back and let them know and they will send patient another monitor.

## 2020-03-24 NOTE — Telephone Encounter (Signed)
I think 6 days will be enough.

## 2020-03-27 NOTE — Addendum Note (Signed)
Addended by: Towana Badger on: 03/27/2020 04:27 PM   Modules accepted: Orders

## 2020-03-27 NOTE — Telephone Encounter (Signed)
Dr Madilyn Fireman,   The ultrasound never was scheduled for duplex because there was an issue with the order.  I spoke with imaging and we believe this needs to be a vascular order. I have pended a new order and chose the location that patient confirms she has had other studies done at.   Can you please look over it and sign if OK?

## 2020-03-27 NOTE — Telephone Encounter (Signed)
Vascular order signed.

## 2020-03-27 NOTE — Addendum Note (Signed)
Addended by: Beatrice Lecher D on: 03/27/2020 04:42 PM   Modules accepted: Orders

## 2020-03-29 ENCOUNTER — Ambulatory Visit (HOSPITAL_COMMUNITY)
Admission: RE | Admit: 2020-03-29 | Discharge: 2020-03-29 | Disposition: A | Discharge status: Home / Self Care | Payer: Medicare Other | Source: Ambulatory Visit | Attending: Family Medicine | Admitting: Family Medicine

## 2020-03-29 ENCOUNTER — Other Ambulatory Visit: Payer: Self-pay

## 2020-03-29 DIAGNOSIS — I739 Peripheral vascular disease, unspecified: Secondary | ICD-10-CM

## 2020-04-03 ENCOUNTER — Telehealth: Payer: Self-pay | Admitting: Cardiovascular Disease

## 2020-04-03 DIAGNOSIS — G8929 Other chronic pain: Secondary | ICD-10-CM | POA: Diagnosis not present

## 2020-04-03 DIAGNOSIS — M5442 Lumbago with sciatica, left side: Secondary | ICD-10-CM | POA: Diagnosis not present

## 2020-04-03 DIAGNOSIS — M5416 Radiculopathy, lumbar region: Secondary | ICD-10-CM | POA: Diagnosis not present

## 2020-04-03 DIAGNOSIS — M47816 Spondylosis without myelopathy or radiculopathy, lumbar region: Secondary | ICD-10-CM | POA: Diagnosis not present

## 2020-04-03 DIAGNOSIS — M5441 Lumbago with sciatica, right side: Secondary | ICD-10-CM | POA: Diagnosis not present

## 2020-04-03 DIAGNOSIS — M48061 Spinal stenosis, lumbar region without neurogenic claudication: Secondary | ICD-10-CM | POA: Diagnosis not present

## 2020-04-03 NOTE — Telephone Encounter (Signed)
S/w pt she states that her monitor came off and she has sent it back. She states that she spoke with a nurse and was told that her she should send back and if we needed more info we would call her and mail another monitor. She has not heard anything back

## 2020-04-03 NOTE — Telephone Encounter (Signed)
CON'T... Sophronia Simas will fax report to [phone].

## 2020-04-03 NOTE — Telephone Encounter (Signed)
S/W Sophronia Simas AT [facility] THAT SHE "SEES" THAT PT RETURNED MONITOR. "6 days and 11hrs" 10/1 thru 10-8 final on the 16th

## 2020-04-03 NOTE — Telephone Encounter (Signed)
Traci [name] with [facility] calling for results, she believes we are under the impression we have 7 days of results but we only have a day and a half. Would like to know if test should be repeated. Please call/advise.

## 2020-04-05 ENCOUNTER — Ambulatory Visit (INDEPENDENT_AMBULATORY_CARE_PROVIDER_SITE_OTHER): Payer: Medicare Other

## 2020-04-05 ENCOUNTER — Other Ambulatory Visit: Payer: Self-pay

## 2020-04-05 DIAGNOSIS — Z13820 Encounter for screening for osteoporosis: Secondary | ICD-10-CM

## 2020-04-05 DIAGNOSIS — Z780 Asymptomatic menopausal state: Secondary | ICD-10-CM | POA: Diagnosis not present

## 2020-04-05 DIAGNOSIS — M8589 Other specified disorders of bone density and structure, multiple sites: Secondary | ICD-10-CM | POA: Diagnosis not present

## 2020-04-05 DIAGNOSIS — M810 Age-related osteoporosis without current pathological fracture: Secondary | ICD-10-CM

## 2020-04-05 DIAGNOSIS — E2839 Other primary ovarian failure: Secondary | ICD-10-CM | POA: Diagnosis not present

## 2020-04-06 ENCOUNTER — Other Ambulatory Visit: Payer: Self-pay | Admitting: Neurosurgery

## 2020-04-06 DIAGNOSIS — G8929 Other chronic pain: Secondary | ICD-10-CM

## 2020-04-06 DIAGNOSIS — M5441 Lumbago with sciatica, right side: Secondary | ICD-10-CM

## 2020-04-11 ENCOUNTER — Other Ambulatory Visit: Payer: Self-pay

## 2020-04-11 DIAGNOSIS — K551 Chronic vascular disorders of intestine: Secondary | ICD-10-CM

## 2020-04-11 DIAGNOSIS — I700 Atherosclerosis of aorta: Secondary | ICD-10-CM

## 2020-04-17 ENCOUNTER — Ambulatory Visit (INDEPENDENT_AMBULATORY_CARE_PROVIDER_SITE_OTHER): Payer: Medicare Other

## 2020-04-17 ENCOUNTER — Other Ambulatory Visit: Payer: Self-pay

## 2020-04-17 DIAGNOSIS — M5126 Other intervertebral disc displacement, lumbar region: Secondary | ICD-10-CM | POA: Diagnosis not present

## 2020-04-17 DIAGNOSIS — M4807 Spinal stenosis, lumbosacral region: Secondary | ICD-10-CM | POA: Diagnosis not present

## 2020-04-17 DIAGNOSIS — M48061 Spinal stenosis, lumbar region without neurogenic claudication: Secondary | ICD-10-CM | POA: Diagnosis not present

## 2020-04-17 DIAGNOSIS — G8929 Other chronic pain: Secondary | ICD-10-CM | POA: Diagnosis not present

## 2020-04-17 DIAGNOSIS — M47817 Spondylosis without myelopathy or radiculopathy, lumbosacral region: Secondary | ICD-10-CM | POA: Diagnosis not present

## 2020-04-17 DIAGNOSIS — M5441 Lumbago with sciatica, right side: Secondary | ICD-10-CM

## 2020-04-17 DIAGNOSIS — M5442 Lumbago with sciatica, left side: Secondary | ICD-10-CM | POA: Diagnosis not present

## 2020-04-19 DIAGNOSIS — M47816 Spondylosis without myelopathy or radiculopathy, lumbar region: Secondary | ICD-10-CM | POA: Diagnosis not present

## 2020-04-19 DIAGNOSIS — M5442 Lumbago with sciatica, left side: Secondary | ICD-10-CM | POA: Diagnosis not present

## 2020-04-19 DIAGNOSIS — G8929 Other chronic pain: Secondary | ICD-10-CM | POA: Diagnosis not present

## 2020-04-19 DIAGNOSIS — M5416 Radiculopathy, lumbar region: Secondary | ICD-10-CM | POA: Diagnosis not present

## 2020-04-19 DIAGNOSIS — Z6827 Body mass index (BMI) 27.0-27.9, adult: Secondary | ICD-10-CM | POA: Diagnosis not present

## 2020-04-19 DIAGNOSIS — I10 Essential (primary) hypertension: Secondary | ICD-10-CM | POA: Diagnosis not present

## 2020-04-22 ENCOUNTER — Other Ambulatory Visit: Payer: Self-pay | Admitting: Family Medicine

## 2020-04-24 ENCOUNTER — Other Ambulatory Visit: Payer: Self-pay | Admitting: Family Medicine

## 2020-04-24 NOTE — Telephone Encounter (Signed)
Call pt: I see she is wanting refill on her Chlorazepate.  We really need to work on decreasing her use of benzos because of high risk of complications like dementia, etc.  I will refill med bur she really need to use is sparingly.  Can even try taking half a tab.

## 2020-04-26 ENCOUNTER — Ambulatory Visit (INDEPENDENT_AMBULATORY_CARE_PROVIDER_SITE_OTHER): Payer: Medicare Other | Admitting: Vascular Surgery

## 2020-04-26 ENCOUNTER — Other Ambulatory Visit: Payer: Self-pay

## 2020-04-26 ENCOUNTER — Encounter: Payer: Self-pay | Admitting: Cardiovascular Disease

## 2020-04-26 ENCOUNTER — Ambulatory Visit (HOSPITAL_COMMUNITY)
Admission: RE | Admit: 2020-04-26 | Discharge: 2020-04-26 | Disposition: A | Discharge status: Home / Self Care | Payer: Medicare Other | Source: Ambulatory Visit | Attending: Vascular Surgery | Admitting: Vascular Surgery

## 2020-04-26 ENCOUNTER — Encounter: Payer: Self-pay | Admitting: Vascular Surgery

## 2020-04-26 ENCOUNTER — Ambulatory Visit (INDEPENDENT_AMBULATORY_CARE_PROVIDER_SITE_OTHER): Payer: Medicare Other | Admitting: Cardiovascular Disease

## 2020-04-26 VITALS — BP 145/62 | HR 62 | Temp 97.6°F | Resp 20 | Ht 63.0 in | Wt 155.0 lb

## 2020-04-26 VITALS — BP 144/72 | HR 60 | Ht 64.0 in | Wt 155.0 lb

## 2020-04-26 DIAGNOSIS — K551 Chronic vascular disorders of intestine: Secondary | ICD-10-CM

## 2020-04-26 DIAGNOSIS — I700 Atherosclerosis of aorta: Secondary | ICD-10-CM

## 2020-04-26 DIAGNOSIS — I2510 Atherosclerotic heart disease of native coronary artery without angina pectoris: Secondary | ICD-10-CM | POA: Diagnosis not present

## 2020-04-26 DIAGNOSIS — I150 Renovascular hypertension: Secondary | ICD-10-CM

## 2020-04-26 DIAGNOSIS — I739 Peripheral vascular disease, unspecified: Secondary | ICD-10-CM

## 2020-04-26 DIAGNOSIS — Z48812 Encounter for surgical aftercare following surgery on the circulatory system: Secondary | ICD-10-CM

## 2020-04-26 DIAGNOSIS — E7800 Pure hypercholesterolemia, unspecified: Secondary | ICD-10-CM | POA: Diagnosis not present

## 2020-04-26 DIAGNOSIS — I471 Supraventricular tachycardia: Secondary | ICD-10-CM

## 2020-04-26 NOTE — Progress Notes (Signed)
Patient ID: Traci [name], female   DOB: Jun 22, 1945, 74 y.o.   MRN: [id number]    Cardiology Office Note    Date:  04/26/2020   ID:  Traci [name], DOB 01/12/1946, MRN [id number]  PCP:  Hali Marry, MD  Cardiologist:   Sanda Klein, MD   Chief Complaint  Patient presents with  . Follow-up  . Headache  . Shortness of Breath  . Edema    Legs.    History of Present Illness:  Traci [name] is a 74 y.o. female with an extensive history of peripheral arterial disease involving the mesenteric circulation and upper extremities, mild nonobstructive coronary atherosclerosis, hyperlipidemia, hypertension.  She has had a few more episodes of epigastric discomfort similar to the one that occurred in March leading to her emergency room visit.  Sometimes are associated with eating, but not always.  They never occur during physical activity.  Always at rest.  I description today almost sounds like it might be radicular pain radiating from her thoracic spine.  She saw her spine specialist, Dr. Vertell Limber who commented about her L4-L5 "rod", but did not discuss any abnormalities in her thoracic spine.    She does not have any exertional complaints.  She denies shortness of breath at rest or with activity.  Her highly volatile blood pressure that bother her throughout the month of July it has settled down and now is consistently well controlled.  She had an abdominal ultrasound to evaluate her mesenteric bypass in Dr. Nicole Cella office today, with normal findings.  He plans to reevaluate her carotid subclavian bypass next spring.  She has occasional palpitations, which she feels usually when she lies in bed at night, but has not had problems with  syncope, dizziness.  Denies intermittent claudication or leg edema.  She reports that she had anaphylaxis in response to contrast administration in the past but that she is also allergic to Solu-Medrol and prednisone which  make her "pass out".  Cardiac catheterization performed with exclusively Benadryl as premedication occurred without adverse events.  Traci [name] has long-standing history of peripheral arterial disease with previous stents and surgical revascularization of the left subclavian artery for subclavian steal syndrome and presyncope. She eventually underwent a left carotid to subclavian bypass.  She underwent a bypass from the aorta to the chronically totally occluded superficial mesenteric artery due to symptoms of chronic mesenteric ischemia, (Dr. Scot Dock August 2015 6 mm Dacron graft) She had moderate CAD by coronary angiography performed in 2009. Normal left ventricular systolic function and no perfusion abnormalities by echo 2015 and nuclear stress test performed in 2014 and  October 2017. She had carotid duplex ultrasonography last performed in March 2019, without evidence of significant obstruction. She has a left carotid-subclavian bypass graft without evidence of stenosis. The aortic SMA bypass was noted on abdominal CT performed in July 2018, although no comment was made about its patency. There was no evidence of aortic aneurysm and no comment made about the renal arteries.   Past Medical History:  Diagnosis Date  . Anxiety   . Atrial fibrillation ([place])   . CAD (coronary artery disease)   . Claudication ([place]) 01/06/2008   Lower extremity dopplers - no evidence of arterial insufficiency, normal exam  . Coronary artery disease   . GERD (gastroesophageal reflux disease)   . Hyperlipidemia   . Hypertension 11/30/2010   echo- EF 55%; normal w/ mildly sclerotic aortic valve  . Hypertension 11/05/11   renal dopplers - celiac  artery and SMA >50% diameter reduction, R renal artery - mildly elevated velocities 1-59% diameter reduction, L renal artery normal  . Nonspecific ST-T wave electrocardiographic changes 03/26/2011   R/Lmv - EF 74%, normal perfusion all regions, ST depression w/  Lexiscan infusion w/o assoc angina  . Osteopenia   . PAD (peripheral artery disease) ([place])   . Peripheral vascular disease ([place])   . PVD (peripheral vascular disease) ([place]) 11/05/2011   doppler - R/L brachial pressures essentially equal w/o inflow disease; L sublclavian/CCA bypass graft demonstrates patent flow, no evidence of significant stenosis  . Sigmoid diverticulitis     Past Surgical History:  Procedure Laterality Date  . ABDOMINAL AORTIC ANEURYSM REPAIR N/A 01/27/2014   Procedure: AORTO-SUPERIOR MESENTERIC ARTERY BYPASS GRAFT;  Surgeon: Angelia Mould, MD;  Location: [facility];  Service: Vascular;  Laterality: N/A;  . ABDOMINAL HYSTERECTOMY    . APPENDECTOMY    . CARDIAC CATHETERIZATION  06/03/2008   60% LAD, involving D2, borderline significant by IVUS, medical therapy. CFX, RCA OK.  Marland Kitchen CATARACT EXTRACTION    . CHOLECYSTECTOMY    . EYE SURGERY     retinal surgery  . SPINE SURGERY  Feb. 2011  . [place]   X's  several  . SUBCLAVIAN ARTERY STENT Left 09/20/2008   LSA ISR 7x42mm Cordis Genesis on Opta premount, reduction from 80% to 0%  . subclavian artery stents  01/23/2010   Left carotid to subclavian artery Bypass  . VISCERAL ANGIOGRAM  01/17/2014   Procedure: VISCERAL ANGIOGRAM;  Surgeon: Angelia Mould, MD;  Location: [facility] CATH LAB;  Service: Cardiovascular;;    Outpatient Medications Prior to Visit  Medication Sig Dispense Refill  . aspirin EC 81 MG tablet Take 81 mg by mouth daily.    Marland Kitchen atenolol (TENORMIN) 25 MG tablet TAKE 1 TABLET DAILY 90 tablet 3  . atorvastatin (LIPITOR) 10 MG tablet TAKE 1 TABLET DAILY AT 6 P.M. 90 tablet 1  . candesartan (ATACAND) 16 MG tablet TAKE ONE-HALF (1/2) TABLET DAILY 45 tablet 1  . cholecalciferol (VITAMIN D) 1000 UNITS tablet Take 1,000 Units by mouth daily.    . clopidogrel (PLAVIX) 75 MG tablet TAKE 1 TABLET DAILY 90 tablet 1  . clorazepate (TRANXENE) 7.5 MG tablet TAKE 1 TABLET DAILY AS NEEDED 90 tablet 0   . cyanocobalamin 1000 MCG tablet Take 100 mcg by mouth daily.    Marland Kitchen EPINEPHrine (EPIPEN 2-PAK) 0.3 mg/0.3 mL IJ SOAJ injection Inject 0.3 mLs (0.3 mg total) into the muscle as needed (for allergic reaction). 2 Device 0  . esomeprazole (NEXIUM) 40 MG capsule Take 1 capsule (40 mg total) by mouth daily. Take 1 tab daily (Patient taking differently: Take 40 mg by mouth daily. ) 90 capsule 0  . estradiol (ESTRACE) 0.5 MG tablet TAKE 1 TABLET DAILY 90 tablet 1  . eszopiclone (LUNESTA) 2 MG TABS tablet Take 1 tablet (2 mg total) by mouth at bedtime as needed for sleep. Take immediately before bedtime 30 tablet 0  . furosemide (LASIX) 20 MG tablet TAKE 1 TABLET EVERY OTHER DAY 45 tablet 3  . linaclotide (LINZESS) 145 MCG CAPS capsule Take 1 capsule by mouth daily as needed (constipation).     . meclizine (ANTIVERT) 25 MG tablet Take 0.5 tablets (12.5 mg total) by mouth 3 (three) times daily as needed for dizziness or nausea. 60 tablet 0  . Multiple Vitamin (MULTIVITAMIN WITH MINERALS) TABS Take 1 tablet by mouth daily.  No facility-administered medications prior to visit.     Allergies:   Cortisone, Dilaudid [hydromorphone hcl], Iodine, Medrol [methylprednisolone], Omnipaque [iohexol], Prednisone, Shellfish allergy, Sulfa drugs cross reactors, Doxycycline, Methylprednisolone sodium succ, Sulfa antibiotics, Augmentin [amoxicillin-pot clavulanate], Codeine, Erythromycin, and Morphine and related   Social History   Socioeconomic History  . Marital status: Widowed    Spouse name: Not on file  . Number of children: 1  . Years of education: 69  . Highest education level: Bachelor's degree (e.g., BA, AB, BS)  Occupational History  . Occupation: retired    Comment: Marine scientist  Tobacco Use  . Smoking status: Former Smoker    Packs/day: 0.25    Years: 20.00    Pack years: 5.00    Types: Cigarettes    Quit date: 09/30/1993    Years since quitting: 26.5  . Smokeless tobacco: Never Used  Vaping Use  .  Vaping Use: Never used  Substance and Sexual Activity  . Alcohol use: No  . Drug use: No  . Sexual activity: Not Currently  Other Topics Concern  . 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 No caffeine use.   Social Determinants of Health   Financial Resource Strain:   . Difficulty of Paying Living Expenses: Not on file  Food Insecurity:   . Worried About Charity fundraiser in the Last Year: Not on file  . Ran Out of Food in the Last Year: Not on file  Transportation Needs:   . Lack of Transportation (Medical): Not on file  . Lack of Transportation (Non-Medical): Not on file  Physical Activity:   . Days of Exercise per Week: Not on file  . Minutes of Exercise per Session: Not on file  Stress:   . Feeling of Stress : Not on file  Social Connections:   . Frequency of Communication with Friends and Family: Not on file  . Frequency of Social Gatherings with Friends and Family: Not on file  . Attends Religious Services: Not on file  . Active Member of Clubs or Organizations: Not on file  . Attends Archivist Meetings: Not on file  . Marital Status: Not on file     Family History:  The patient's family history includes Diabetes in her maternal grandmother; Heart attack in her father; Heart disease in her paternal grandfather; Heart disease (age of onset: 18) in her father; Hyperlipidemia in her father; Hypertension in her father; Kidney disease in her father; Kidney failure in her mother.   ROS:   Please see the history of present illness.    ROS All other systems are reviewed and are negative  PHYSICAL EXAM:   VS:  BP (!) 144/72 (BP Location: Left Arm, Patient Position: Sitting, Cuff Size: Normal)   Pulse 60   Ht 5\' 4"  (1.626 m)   Wt 155 lb (70.3 kg)   BMI 26.61 kg/m    Blood pressure left arm is 122/68    General: Alert, oriented x3, no distress Head: no evidence of trauma, PERRL, EOMI, no exophtalmos or  lid lag, no myxedema, no xanthelasma; normal ears, nose and oropharynx Neck: normal jugular venous pulsations and no hepatojugular reflux; brisk carotid pulses with bilateral carotid bruits; scar of carotid subclavian bypass Chest: clear to auscultation, no signs of consolidation by percussion or palpation, normal fremitus, symmetrical and full respiratory excursions Cardiovascular: normal position and quality of the apical impulse, regular rhythm, normal first and  second heart sounds, early peaking is 2/6 systolic ejection murmur in the aortic focus, no diastolic murmurs, rubs or gallops Abdomen: no tenderness or distention, no masses by palpation, no abnormal pulsatility or arterial bruits, normal bowel sounds, no hepatosplenomegaly Extremities: no clubbing, cyanosis or edema; 2+ radial, ulnar and brachial pulses bilaterally; 2+ right femoral, posterior tibial and dorsalis pedis pulses; 2+ left femoral, posterior tibial and dorsalis pedis pulses; no subclavian or femoral bruits Neurological: grossly nonfocal Psych: Normal mood and affect   Wt Readings from Last 3 Encounters:  04/26/20 155 lb (70.3 kg)  04/26/20 155 lb (70.3 kg)  02/28/20 151 lb 8 oz (68.7 kg)    Studies/Labs Reviewed:   EKG:  EKG is ordered today.  It shows NSR, normal tracing  Duplex carotid ultrasound December 16, 2018: Summary: Right Carotid: Velocities in the right ICA are consistent with a 1-39% stenosis.  Left Carotid: Velocities in the left ICA are consistent with a 1-39% stenosis.               Widely patent left common carotid to subclavian artery bypass               graft.  Vertebrals:  Bilateral vertebral arteries demonstrate antegrade flow. Subclavians: Normal flow hemodynamics were seen in bilateral subclavian              arteries.   Lexiscan Myoview 01/13/2019:  The left ventricular ejection fraction is hyperdynamic (>65%).  Nuclear stress EF: 71%.  There was no ST segment deviation noted during  stress.  T wave inversion was noted during stress in the II, III, aVF, V3, V4, V5 and V6 leads, beginning at 30 seconds of stress, and returning to baseline after less than 1 min of recovery.  This is a low risk study.   No ischemia or infarction noted.  There is significant tracer uptake in the bowel which may impact the interpretation of the inferior wall which appears to have normal perfusion. Wall motion is normal, therefore perfusion is likely normal.   Consider performing future studies on the D-Spect (supine and upright) camera.    Recent Labs: 09/03/2019: TSH 1.38 02/07/2020: Hemoglobin 12.5; Platelets 310 02/29/2020: ALT 13; BUN 9; Creat 0.83; Potassium 4.5; Sodium 137   Lipid Panel    Component Value Date/Time   CHOL 122 02/29/2020 0918   CHOL 129 02/17/2019 1234   TRIG 145 02/29/2020 0918   HDL 47 (L) 02/29/2020 0918   HDL 47 02/17/2019 1234   CHOLHDL 2.6 02/29/2020 0918   VLDL 26 02/07/2016 0822   LDLCALC 52 02/29/2020 0918    ASSESSMENT:    1. Coronary artery disease, non-occlusive, last cath 2009   2. Superior mesenteric artery stenosis ([place])   3. PAD (peripheral artery disease) ([place])   4. Pure hypercholesterolemia   5. Atherosclerosis of aorta ([place])   6. Renovascular hypertension   7. PAT (paroxysmal atrial tachycardia) ([facility])    PLAN:  In order of problems listed above:  1. CAD:  known moderate stenosis in the LAD artery by previous coronary angiography.  No evidence of ischemia by myocardial perfusion study January 05, 2019.  I do not think her current occasional epigastric discomfort represents angina pectoris.  I wonder whether she may have some compressive radiculopathy from thoracic spine disease.  She has osteopenia, not osteoporosis. 2. 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 Status post aorto mesenteric bypass.  Reportedly evaluated with favorable findings in vascular office this morning. 3. L SCL artery  occlusion: Does not have any  symptoms of upper extremity claudication.  No significant obstruction on most recent duplex ultrasound, plan reevaluation next spring at the [facility] office. 4. HLP: Recent lipid profile shows excellent lipid parameters (LDL 52 , target less than 70). 5. Ao atherosclerosis: seenon radiological studies and associated with extensive PAD.  Increases the likelihood of renal artery stenosis and secondary hypertension. 6. HTN: The wide fluctuations in her blood pressure has settled down.  Slightly high systolic blood pressure today, but just a few weeks ago her blood pressure is 126/43.  No changes made to her medications today. 7. Palpitations: I reviewed the limited recording from her arrhythmia monitor from last month.  This was ordered for 7 days, but only roughly 24 hours were interpretable due to artifact.  She has occasional PACs and very brief 3-4 beats of nonsustained atrial tachycardia, otherwise normal findings.  No specific therapy is recommended.  Medication Adjustments/Labs and Tests Ordered: Current medicines are reviewed at length with the patient today.  Concerns regarding medicines are outlined above.  Medication changes, Labs and Tests ordered today are listed in the Patient Instructions below. Patient Instructions  Medication Instructions:  No changes *If you need a refill on your cardiac medications before your next appointment, please call your pharmacy*   Lab Work: None ordered If you have labs (blood work) drawn today and your tests are completely normal, you will receive your results only by: Marland Kitchen MyChart Message (if you have MyChart) OR . A paper copy in the mail If you have any lab test that is abnormal or we need to change your treatment, we will call you to review the results.   Testing/Procedures: None ordered   Follow-Up: At [facility], you and your health needs are our priority.  As part of our continuing mission to provide you with exceptional heart care, we have  created designated Provider Care Teams.  These Care Teams include your primary Cardiologist (physician) and Advanced Practice Providers (APPs -  Physician Assistants and Nurse Practitioners) who all work together to provide you with the care you need, when you need it.  We recommend signing up for the patient portal called "MyChart".  Sign up information is provided on this After Visit Summary.  MyChart is used to connect with patients for Virtual Visits (Telemedicine).  Patients are able to view lab/test results, encounter notes, upcoming appointments, etc.  Non-urgent messages can be sent to your provider as well.   To learn more about what you can do with MyChart, go to [email or web address].    Your next appointment:   12 month(s)  The format for your next appointment:   In Person  Provider:   You may see Jilliann Subramanian,MD or one of the following Advanced Practice Providers on your designated Care Team:    Almyra Deforest, PA-C  Fabian Sharp, [place] or   Roby Lofts, PA-C        Signed, Sanda Klein, MD  04/26/2020 3:20 PM    [facility] [place], [place],   [address] Phone: [phone]; Fax: [phone]

## 2020-04-26 NOTE — Progress Notes (Signed)
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  She is had some chronic mild abdominal pain but no weight loss.  I encouraged her to stay as active as possible.  She is on aspirin and is on a statin.  She is not a smoker.  I will do a follow-up duplex scan in 2 years.  S/P LEFT CAROTID SUBCLAVIAN BYPASS: Her left carotid subclavian bypass graft is patent.  She has a palpable left radial pulse.  She is asymptomatic.  I have ordered a carotid duplex scan and graft duplex in 1 year.  I will see her back at that time.  LEG PAIN: The patient does have leg pain bilaterally.  This occurs with ambulation but also occurs with standing.  She has known back issues and has had previous back surgery.  She has palpable pedal pulses and I reassured her that I do not think her leg pain is related to peripheral vascular disease.  HPI:   Traci [name] is a pleasant 74 y.o. female who I last saw on 12/16/2018.  She has an extensive history.  She underwent a previous left carotid subclavian bypass.  In addition in 2015 she had an aorto SMA bypass.  When I saw her last her carotid duplex scan showed that her left carotid subclavian bypass graft was patent.  She had no significant carotid stenosis bilaterally.  She comes in for routine follow-up visit.  Patient overall is doing well.  She has had chronic mild abdominal pain.  She denies weight loss.  I do not get any clear-cut postprandial abdominal pain.  She does have some chronic back pain has had previous back surgery.  She has pain radiating down to both legs.  The pain in her legs occurs with standing and also with walking.  She denies any history of stroke, TIAs, expressive or receptive aphasia, or amaurosis fugax.  Past Medical History:  Diagnosis Date  . Anxiety   . Atrial fibrillation ([place])   . CAD  (coronary artery disease)   . Claudication ([place]) 01/06/2008   Lower extremity dopplers - no evidence of arterial insufficiency, normal exam  . Coronary artery disease   . GERD (gastroesophageal reflux disease)   . Hyperlipidemia   . Hypertension 11/30/2010   echo- EF 55%; normal w/ mildly sclerotic aortic valve  . Hypertension 11/05/11   renal dopplers - celiac artery and SMA >50% diameter reduction, R renal artery - mildly elevated velocities 1-59% diameter reduction, L renal artery normal  . Nonspecific ST-T wave electrocardiographic changes 03/26/2011   R/Lmv - EF 74%, normal perfusion all regions, ST depression w/ Lexiscan infusion w/o assoc angina  . Osteopenia   . PAD (peripheral artery disease) ([place])   . Peripheral vascular disease ([place])   . PVD (peripheral vascular disease) ([place]) 11/05/2011   doppler - R/L brachial pressures essentially equal w/o inflow disease; L sublclavian/CCA bypass graft demonstrates patent flow, no evidence of significant stenosis  . Sigmoid diverticulitis     Family History  Problem Relation Age of Onset  . Heart disease Father 69       Heart Disease before age 64  . Kidney disease Father   . Heart attack Father   . Hyperlipidemia Father   . Hypertension Father   . Kidney failure Mother   . Diabetes Maternal Grandmother   .  Heart disease Paternal Grandfather     SOCIAL HISTORY: Social History   Tobacco Use  . Smoking status: Former Smoker    Packs/day: 0.25    Years: 20.00    Pack years: 5.00    Types: Cigarettes    Quit date: 09/30/1993    Years since quitting: 26.5  . Smokeless tobacco: Never Used  Substance Use Topics  . Alcohol use: No    Allergies  Allergen Reactions  . Cortisone Anaphylaxis  . Dilaudid [Hydromorphone Hcl] Nausea And Vomiting    Pt states she will start vomiting immediately for 6 hours  . Iodine Anaphylaxis  . Medrol [Methylprednisolone] Anaphylaxis  . Omnipaque [Iohexol] Anaphylaxis  . Prednisone Anaphylaxis and  Swelling  . Shellfish Allergy Anaphylaxis  . Sulfa Drugs Cross Reactors Anaphylaxis  . Doxycycline     Thrush/itching  . Methylprednisolone Sodium Succ Swelling    Blood pressure drops immediately  . Sulfa Antibiotics Other (See Comments)    Stomach upset  . Augmentin [Amoxicillin-Pot Clavulanate] Nausea Only    Upset stomach (Has taken cephalexin in the past without problems)  . Codeine Rash and Other (See Comments)    Upset stomach  . Erythromycin Rash  . Morphine And Related Nausea Only    nausea    Current Outpatient Medications  Medication Sig Dispense Refill  . aspirin EC 81 MG tablet Take 81 mg by mouth daily.    Marland Kitchen atenolol (TENORMIN) 25 MG tablet TAKE 1 TABLET DAILY 90 tablet 3  . atorvastatin (LIPITOR) 10 MG tablet TAKE 1 TABLET DAILY AT 6 P.M. 90 tablet 1  . candesartan (ATACAND) 16 MG tablet TAKE ONE-HALF (1/2) TABLET DAILY 45 tablet 1  . cholecalciferol (VITAMIN D) 1000 UNITS tablet Take 1,000 Units by mouth daily.    . clopidogrel (PLAVIX) 75 MG tablet TAKE 1 TABLET DAILY 90 tablet 1  . clorazepate (TRANXENE) 7.5 MG tablet TAKE 1 TABLET DAILY AS NEEDED 90 tablet 0  . cyanocobalamin 1000 MCG tablet Take 100 mcg by mouth daily.    Marland Kitchen EPINEPHrine (EPIPEN 2-PAK) 0.3 mg/0.3 mL IJ SOAJ injection Inject 0.3 mLs (0.3 mg total) into the muscle as needed (for allergic reaction). 2 Device 0  . esomeprazole (NEXIUM) 40 MG capsule Take 1 capsule (40 mg total) by mouth daily. Take 1 tab daily (Patient taking differently: Take 40 mg by mouth daily. ) 90 capsule 0  . estradiol (ESTRACE) 0.5 MG tablet TAKE 1 TABLET DAILY 90 tablet 1  . eszopiclone (LUNESTA) 2 MG TABS tablet Take 1 tablet (2 mg total) by mouth at bedtime as needed for sleep. Take immediately before bedtime 30 tablet 0  . furosemide (LASIX) 20 MG tablet TAKE 1 TABLET EVERY OTHER DAY 45 tablet 3  . linaclotide (LINZESS) 145 MCG CAPS capsule Take 1 capsule by mouth daily as needed (constipation).     . meclizine (ANTIVERT)  25 MG tablet Take 0.5 tablets (12.5 mg total) by mouth 3 (three) times daily as needed for dizziness or nausea. 60 tablet 0  . Multiple Vitamin (MULTIVITAMIN WITH MINERALS) TABS Take 1 tablet by mouth daily.     No current facility-administered medications for this visit.    REVIEW OF SYSTEMS:  [X]  denotes positive finding, [ ]  denotes negative finding Cardiac  Comments:  Chest pain or chest pressure:    Shortness of breath upon exertion:    Short of breath when lying flat:    Irregular heart rhythm: x       Vascular  Pain in calf, thigh, or hip brought on by ambulation: x   Pain in feet at night that wakes you up from your sleep:     Blood clot in your veins:    Leg swelling:  x       Pulmonary    Oxygen at home:    Productive cough:     Wheezing:         Neurologic    Sudden weakness in arms or legs:     Sudden numbness in arms or legs:     Sudden onset of difficulty speaking or slurred speech:    Temporary loss of vision in one eye:     Problems with dizziness:         Gastrointestinal    Blood in stool:     Vomited blood:         Genitourinary    Burning when urinating:     Blood in urine:        Psychiatric    Major depression:         Hematologic    Bleeding problems:    Problems with blood clotting too easily:        Skin    Rashes or ulcers:        Constitutional    Fever or chills:     PHYSICAL EXAM:   Vitals:   04/26/20 0930  BP: (!) 145/62  Pulse: 62  Resp: 20  Temp: 97.6 F (36.4 C)  SpO2: 98%  Weight: 155 lb (70.3 kg)  Height: 5\' 3"  (1.6 m)    GENERAL: The patient is a well-nourished female, in no acute distress. The vital signs are documented above. CARDIAC: There is a regular rate and rhythm.  VASCULAR: She has a right carotid bruit. She has palpable radial pulses. She has palpable femoral, popliteal, and posterior tibial pulses bilaterally. She has no significant lower extremity swelling. PULMONARY: There is good air exchange  bilaterally without wheezing or rales. ABDOMEN: Soft and non-tender with normal pitched bowel sounds.  MUSCULOSKELETAL: There are no major deformities or cyanosis. NEUROLOGIC: No focal weakness or paresthesias are detected. SKIN: There are no ulcers or rashes noted. PSYCHIATRIC: The patient has a normal affect.  DATA:    ABDOMINAL DUPLEX: I have independently interpreted her abdominal duplex scan today.  Her aorto SMA bypass graft is patent with no areas of stenosis noted within the graft.  [name] [facility] [phone]

## 2020-04-26 NOTE — Patient Instructions (Signed)

## 2020-04-27 NOTE — Telephone Encounter (Signed)
Patient made aware.

## 2020-05-08 ENCOUNTER — Encounter: Payer: Self-pay | Admitting: Osteopathic Medicine

## 2020-05-08 ENCOUNTER — Telehealth (INDEPENDENT_AMBULATORY_CARE_PROVIDER_SITE_OTHER): Payer: Medicare Other | Admitting: Osteopathic Medicine

## 2020-05-08 ENCOUNTER — Other Ambulatory Visit: Payer: Self-pay | Admitting: Osteopathic Medicine

## 2020-05-08 DIAGNOSIS — Z20822 Contact with and (suspected) exposure to covid-19: Secondary | ICD-10-CM

## 2020-05-08 DIAGNOSIS — J029 Acute pharyngitis, unspecified: Secondary | ICD-10-CM

## 2020-05-08 DIAGNOSIS — I2510 Atherosclerotic heart disease of native coronary artery without angina pectoris: Secondary | ICD-10-CM

## 2020-05-08 DIAGNOSIS — R6889 Other general symptoms and signs: Secondary | ICD-10-CM | POA: Diagnosis not present

## 2020-05-08 LAB — POCT INFLUENZA A/B
Influenza A, POC: NEGATIVE
Influenza B, POC: NEGATIVE

## 2020-05-08 LAB — POCT RAPID STREP A (OFFICE): Rapid Strep A Screen: NEGATIVE

## 2020-05-08 MED ORDER — AMOXICILLIN 500 MG PO CAPS
500.0000 mg | ORAL_CAPSULE | Freq: Three times a day (TID) | ORAL | 0 refills | Status: DC
Start: 2020-05-08 — End: 2020-05-22

## 2020-05-08 MED ORDER — NYSTATIN 100000 UNIT/ML MT SUSP
500000.0000 [IU] | Freq: Four times a day (QID) | OROMUCOSAL | 1 refills | Status: DC
Start: 2020-05-08 — End: 2020-07-26

## 2020-05-08 NOTE — Progress Notes (Signed)
Per provider's request - pt tested for POCT strep and Covid.

## 2020-05-08 NOTE — Progress Notes (Signed)
Virtual Visit via Phone  I connected with      Traci [name] on 05/08/20 at 4:00 PM  by a telemedicine application and verified that I am speaking with the correct person using two identifiers.  Patient is at home  I am in office   I discussed the limitations of evaluation and management by telemedicine and the availability of in person appointments. The patient expressed understanding and agreed to proceed.  History of Present Illness: Traci [name] is a 74 y.o. female who would like to discuss feeling ill - concern for strep throat    Started 4 days ago Sore throat Concerned about strep  Ears popping  Cervical lymph nodes swollen No fever O2 is 98 and breathing well and no coughing Concerned about thrush on tongue  OCT meds: Tylenol, Cold-Ease, Zinc has been vaccinated for COVID and influenza.       Observations/Objective: There were no vitals taken for this visit. BP Readings from Last 3 Encounters:  04/26/20 (!) 144/72  04/26/20 (!) 145/62  02/28/20 (!) 126/43   Exam: Normal Speech.  NAD  Lab and Radiology Results No results found for this or any previous visit (from the past 72 hour(s)). No results found.     Assessment and Plan: 74 y.o. female with The primary encounter diagnosis was Sore throat. Diagnoses of Flu-like symptoms and Suspected COVID-19 virus infection were also pertinent to this visit.  Treat as possible strep but will also test as below strep, flu, COVID. Advised isolation until 10 days including 3 w/o fever   PDMP not reviewed this encounter. Orders Placed This Encounter  Procedures   Novel Coronavirus, NAA ([facility])    Order Specific Question:   Is this test for diagnosis or screening    Answer:   Diagnosis of ill patient    Order Specific Question:   Symptomatic for COVID-19 as defined by CDC    Answer:   Yes    Order Specific Question:   Date of Symptom Onset    Answer:   05/08/2020    Order Specific  Question:   Hospitalized for COVID-19    Answer:   No    Order Specific Question:   Admitted to ICU for COVID-19    Answer:   No    Order Specific Question:   Previously tested for COVID-19    Answer:   No    Order Specific Question:   Resident in a congregate (group) care setting    Answer:   No    Order Specific Question:   Is the patient student?    Answer:   No    Order Specific Question:   Employed in healthcare setting    Answer:   No    Order Specific Question:   Pregnant    Answer:   No    Order Specific Question:   Has patient completed COVID vaccination(s) (2 doses of Pfizer/Moderna 1 dose of [employer])    Answer:   Yes   POCT rapid strep A   POCT Influenza A/B   Meds ordered this encounter  Medications   amoxicillin (AMOXIL) 500 MG capsule    Sig: Take 1 capsule (500 mg total) by mouth 3 (three) times daily.    Dispense:  30 capsule    Refill:  0   nystatin (MYCOSTATIN) 100000 UNIT/ML suspension    Sig: Take 5 mLs (500,000 Units total) by mouth 4 (four) times daily. Swish for 30  seconds and swallow. 14 days    Dispense:  180 mL    Refill:  1   There are no Patient Instructions on file for this visit.  Instructions sent via MyChart. If MyChart not available, pt was given option for info via personal e-mail w/ no guarantee of protected health info over unsecured e-mail communication, and MyChart sign-up instructions were sent to patient.   Follow Up Instructions: Return if symptoms worsen or fail to improve.    I discussed the assessment and treatment plan with the patient. The patient was provided an opportunity to ask questions and all were answered. The patient agreed with the plan and demonstrated an understanding of the instructions.   The patient was advised to call back or seek an in-person evaluation if any new concerns, if symptoms worsen or if the condition fails to improve as anticipated.  30 minutes of non-face-to-face time was provided during  this encounter.      . . . . . . . . . . . . . Marland Kitchen                   Historical information moved to improve visibility of documentation.  Past Medical History:  Diagnosis Date   Anxiety    Atrial fibrillation ([place])    CAD (coronary artery disease)    Claudication ([place]) 01/06/2008   Lower extremity dopplers - no evidence of arterial insufficiency, normal exam   Coronary artery disease    GERD (gastroesophageal reflux disease)    Hyperlipidemia    Hypertension 11/30/2010   echo- EF 55%; normal w/ mildly sclerotic aortic valve   Hypertension 11/05/11   renal dopplers - celiac artery and SMA >50% diameter reduction, R renal artery - mildly elevated velocities 1-59% diameter reduction, L renal artery normal   Nonspecific ST-T wave electrocardiographic changes 03/26/2011   R/Lmv - EF 74%, normal perfusion all regions, ST depression w/ Lexiscan infusion w/o assoc angina   Osteopenia    PAD (peripheral artery disease) ([facility])    Peripheral vascular disease ([facility])    PVD (peripheral vascular disease) ([place]) 11/05/2011   doppler - R/L brachial pressures essentially equal w/o inflow disease; L sublclavian/CCA bypass graft demonstrates patent flow, no evidence of significant stenosis   Sigmoid diverticulitis    Past Surgical History:  Procedure Laterality Date   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 CFX, RCA OK.   CATARACT EXTRACTION     CHOLECYSTECTOMY     EYE SURGERY     retinal surgery   SPINE SURGERY  Feb. 2011   [place]   X's  several   SUBCLAVIAN ARTERY STENT Left 09/20/2008   LSA ISR 7x69mm Cordis  Genesis on Opta premount, reduction from 80% to 0%   subclavian artery stents  01/23/2010   Left carotid to subclavian artery Bypass   VISCERAL ANGIOGRAM  01/17/2014   Procedure: VISCERAL ANGIOGRAM;  Surgeon: Angelia Mould, MD;  Location: [facility] CATH LAB;  Service: Cardiovascular;;   Social History   Tobacco Use   Smoking status: Former Smoker    Packs/day: 0.25    Years: 20.00    Pack years: 5.00  Types: Cigarettes    Quit date: 09/30/1993    Years since quitting: 26.6   Smokeless tobacco: Never Used  Substance Use Topics   Alcohol use: No   family history includes Diabetes in her maternal grandmother; Heart attack in her father; Heart disease in her paternal grandfather; Heart disease (age of onset: 44) in her father; Hyperlipidemia in her father; Hypertension in her father; Kidney disease in her father; Kidney failure in her mother.  Medications: Current Outpatient Medications  Medication Sig Dispense Refill   aspirin EC 81 MG tablet Take 81 mg by mouth daily.     atenolol (TENORMIN) 25 MG tablet TAKE 1 TABLET DAILY 90 tablet 3   atorvastatin (LIPITOR) 10 MG tablet TAKE 1 TABLET DAILY AT 6 P.M. 90 tablet 1   candesartan (ATACAND) 16 MG tablet TAKE ONE-HALF (1/2) TABLET DAILY 45 tablet 1   cholecalciferol (VITAMIN D) 1000 UNITS tablet Take 1,000 Units by mouth daily.     clopidogrel (PLAVIX) 75 MG tablet TAKE 1 TABLET DAILY 90 tablet 1   clorazepate (TRANXENE) 7.5 MG tablet TAKE 1 TABLET DAILY AS NEEDED 90 tablet 0   cyanocobalamin 1000 MCG tablet Take 100 mcg by mouth daily.     EPINEPHrine (EPIPEN 2-PAK) 0.3 mg/0.3 mL IJ SOAJ injection Inject 0.3 mLs (0.3 mg total) into the muscle as needed (for allergic reaction). 2 Device 0   esomeprazole (NEXIUM) 40 MG capsule Take 1 capsule (40 mg total) by mouth daily. Take 1 tab daily (Patient taking differently: Take 40 mg by mouth daily. ) 90 capsule 0   estradiol (ESTRACE) 0.5 MG tablet TAKE 1 TABLET DAILY 90 tablet 1    eszopiclone (LUNESTA) 2 MG TABS tablet Take 1 tablet (2 mg total) by mouth at bedtime as needed for sleep. Take immediately before bedtime 30 tablet 0   furosemide (LASIX) 20 MG tablet TAKE 1 TABLET EVERY OTHER DAY 45 tablet 3   linaclotide (LINZESS) 145 MCG CAPS capsule Take 1 capsule by mouth daily as needed (constipation).      meclizine (ANTIVERT) 25 MG tablet Take 0.5 tablets (12.5 mg total) by mouth 3 (three) times daily as needed for dizziness or nausea. 60 tablet 0   Multiple Vitamin (MULTIVITAMIN WITH MINERALS) TABS Take 1 tablet by mouth daily.     No current facility-administered medications for this visit.   Allergies  Allergen Reactions   Cortisone Anaphylaxis   Dilaudid [Hydromorphone Hcl] Nausea And Vomiting    Pt states she will start vomiting immediately for 6 hours   Iodine Anaphylaxis   Medrol [Methylprednisolone] Anaphylaxis   Omnipaque [Iohexol] Anaphylaxis   Prednisone Anaphylaxis and Swelling   Shellfish Allergy Anaphylaxis   Sulfa Drugs Cross Reactors Anaphylaxis   Doxycycline     Thrush/itching   Methylprednisolone Sodium Succ Swelling    Blood pressure drops immediately   Sulfa Antibiotics Other (See Comments)    Stomach upset   Augmentin [Amoxicillin-Pot Clavulanate] Nausea Only    Upset stomach (Has taken cephalexin in the past without problems)   Codeine Rash and Other (See Comments)    Upset stomach   Erythromycin Rash   Morphine And Related Nausea Only    nausea

## 2020-05-11 LAB — SARS-COV-2, NAA 2 DAY TAT

## 2020-05-11 LAB — NOVEL CORONAVIRUS, NAA: SARS-CoV-2, NAA: NOT DETECTED

## 2020-05-17 DIAGNOSIS — Z6827 Body mass index (BMI) 27.0-27.9, adult: Secondary | ICD-10-CM | POA: Diagnosis not present

## 2020-05-17 DIAGNOSIS — Z889 Allergy status to unspecified drugs, medicaments and biological substances status: Secondary | ICD-10-CM | POA: Diagnosis not present

## 2020-05-17 DIAGNOSIS — Z9889 Other specified postprocedural states: Secondary | ICD-10-CM | POA: Diagnosis not present

## 2020-05-17 DIAGNOSIS — M461 Sacroiliitis, not elsewhere classified: Secondary | ICD-10-CM | POA: Diagnosis not present

## 2020-05-22 ENCOUNTER — Encounter: Payer: Self-pay | Admitting: Family Medicine

## 2020-05-22 ENCOUNTER — Ambulatory Visit (INDEPENDENT_AMBULATORY_CARE_PROVIDER_SITE_OTHER): Payer: Medicare Other | Admitting: Family Medicine

## 2020-05-22 VITALS — BP 142/78 | HR 64 | Ht 63.0 in | Wt 156.0 lb

## 2020-05-22 DIAGNOSIS — J029 Acute pharyngitis, unspecified: Secondary | ICD-10-CM

## 2020-05-22 DIAGNOSIS — H6121 Impacted cerumen, right ear: Secondary | ICD-10-CM | POA: Diagnosis not present

## 2020-05-22 DIAGNOSIS — M542 Cervicalgia: Secondary | ICD-10-CM | POA: Diagnosis not present

## 2020-05-22 DIAGNOSIS — I2510 Atherosclerotic heart disease of native coronary artery without angina pectoris: Secondary | ICD-10-CM | POA: Diagnosis not present

## 2020-05-22 DIAGNOSIS — R062 Wheezing: Secondary | ICD-10-CM | POA: Diagnosis not present

## 2020-05-22 DIAGNOSIS — R221 Localized swelling, mass and lump, neck: Secondary | ICD-10-CM

## 2020-05-22 DIAGNOSIS — R0981 Nasal congestion: Secondary | ICD-10-CM

## 2020-05-22 MED ORDER — AZITHROMYCIN 250 MG PO TABS: ORAL_TABLET | ORAL | 0 refills | Status: AC

## 2020-05-22 NOTE — Progress Notes (Signed)
Established Patient Office Visit  Subjective:  Patient ID: Traci [name], female    DOB: 06-18-1945  Age: 74 y.o. MRN: [id number]  CC:  Chief Complaint  Patient presents with  . Sore Throat    HPI Samyia Motter Sophia-Burleson presents for of recent illness she was seen on November 22 about 2 weeks ago for sore throat, ears popping, and swollen LNs.  She did take the prescription amoxicillin and says that her sinuses are better and the sore throats not as bad but it still sore she also has a lot of neck pain mostly on the left starting from the base of her ear going down to the clavicle she also has a little swelling just above the clavicle on the left side. Still noticing some intermittent wheezing on her chest.    Past Medical History:  Diagnosis Date  . Anxiety   . Atrial fibrillation ([place])   . CAD (coronary artery disease)   . Claudication ([place]) 01/06/2008   Lower extremity dopplers - no evidence of arterial insufficiency, normal exam  . Coronary artery disease   . GERD (gastroesophageal reflux disease)   . Hyperlipidemia   . Hypertension 11/30/2010   echo- EF 55%; normal w/ mildly sclerotic aortic valve  . Hypertension 11/05/11   renal dopplers - celiac artery and SMA >50% diameter reduction, R renal artery - mildly elevated velocities 1-59% diameter reduction, L renal artery normal  . Nonspecific ST-T wave electrocardiographic changes 03/26/2011   R/Lmv - EF 74%, normal perfusion all regions, ST depression w/ Lexiscan infusion w/o assoc angina  . Osteopenia   . PAD (peripheral artery disease) ([place])   . Peripheral vascular disease ([place])   . PVD (peripheral vascular disease) ([place]) 11/05/2011   doppler - R/L brachial pressures essentially equal w/o inflow disease; L sublclavian/CCA bypass graft demonstrates patent flow, no evidence of significant stenosis  . Sigmoid diverticulitis     Past Surgical History:  Procedure Laterality Date  . ABDOMINAL AORTIC ANEURYSM  REPAIR N/A 01/27/2014   Procedure: AORTO-SUPERIOR MESENTERIC ARTERY BYPASS GRAFT;  Surgeon: Angelia Mould, MD;  Location: [facility];  Service: Vascular;  Laterality: N/A;  . ABDOMINAL HYSTERECTOMY    . APPENDECTOMY    . CARDIAC CATHETERIZATION  06/03/2008   60% LAD, involving D2, borderline significant by IVUS, medical therapy. CFX, RCA OK.  Marland Kitchen CATARACT EXTRACTION    . CHOLECYSTECTOMY    . EYE SURGERY     retinal surgery  . SPINE SURGERY  Feb. 2011  . [place] of [place]   X's  several  . SUBCLAVIAN ARTERY STENT Left 09/20/2008   LSA ISR 7x23mm Cordis Genesis on Opta premount, reduction from 80% to 0%  . subclavian artery stents  01/23/2010   Left carotid to subclavian artery Bypass  . VISCERAL ANGIOGRAM  01/17/2014   Procedure: VISCERAL ANGIOGRAM;  Surgeon: Angelia Mould, MD;  Location: [facility] C[facility] LAB;  Service: Cardiovascular;;    Family History  Problem Relation Age of Onset  . Heart disease Father 24       Heart Disease before age 81  . Kidney disease Father   . Heart attack Father   . Hyperlipidemia Father   . Hypertension Father   . Kidney failure Mother   . Diabetes Maternal Grandmother   . Heart disease Paternal Grandfather     Social History   Socioeconomic History  . Marital status: Widowed    Spouse name: Not on file  . Number of  children: 1  . Years of education: 25  . Highest education level: Bachelor's degree (e.g., BA, AB, BS)  Occupational History  . Occupation: retired    Comment: Marine scientist  Tobacco Use  . Smoking status: Former Smoker    Packs/day: 0.25    Years: 20.00    Pack years: 5.00    Types: Cigarettes    Quit date: 09/30/1993    Years since quitting: 26.6  . Smokeless tobacco: Never Used  Vaping Use  . Vaping Use: Never used  Substance and Sexual Activity  . Alcohol use: No  . Drug use: No  . Sexual activity: Not Currently  Other Topics Concern  . 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 No caffeine use.   Social Determinants of Health   Financial Resource Strain:   . Difficulty of Paying Living Expenses: Not on file  Food Insecurity:   . Worried About Charity fundraiser in the Last Year: Not on file  . Ran Out of Food in the Last Year: Not on file  Transportation Needs:   . Lack of Transportation (Medical): Not on file  . Lack of Transportation (Non-Medical): Not on file  Physical Activity:   . Days of Exercise per Week: Not on file  . Minutes of Exercise per Session: Not on file  Stress:   . Feeling of Stress : Not on file  Social Connections:   . Frequency of Communication with Friends and Family: Not on file  . Frequency of Social Gatherings with Friends and Family: Not on file  . Attends Religious Services: Not on file  . Active Member of Clubs or Organizations: Not on file  . Attends Archivist Meetings: Not on file  . Marital Status: Not on file  Intimate Partner Violence:   . Fear of Current or Ex-Partner: Not on file  . Emotionally Abused: Not on file  . Physically Abused: Not on file  . Sexually Abused: Not on file    Outpatient Medications Prior to Visit  Medication Sig Dispense Refill  . aspirin EC 81 MG tablet Take 81 mg by mouth daily.    Marland Kitchen atenolol (TENORMIN) 25 MG tablet TAKE 1 TABLET DAILY 90 tablet 3  . atorvastatin (LIPITOR) 10 MG tablet TAKE 1 TABLET DAILY AT 6 P.M. 90 tablet 1  . candesartan (ATACAND) 16 MG tablet TAKE ONE-HALF (1/2) TABLET DAILY 45 tablet 1  . cholecalciferol (VITAMIN D) 1000 UNITS tablet Take 1,000 Units by mouth daily.    . clopidogrel (PLAVIX) 75 MG tablet TAKE 1 TABLET DAILY 90 tablet 1  . clorazepate (TRANXENE) 7.5 MG tablet TAKE 1 TABLET DAILY AS NEEDED 90 tablet 0  . cyanocobalamin 1000 MCG tablet Take 100 mcg by mouth daily.    Marland Kitchen EPINEPHrine (EPIPEN 2-PAK) 0.3 mg/0.3 mL IJ SOAJ injection Inject 0.3 mLs (0.3 mg total) into the muscle as needed (for allergic reaction). 2 Device 0  .  esomeprazole (NEXIUM) 40 MG capsule Take 1 capsule (40 mg total) by mouth daily. Take 1 tab daily (Patient taking differently: Take 40 mg by mouth daily. ) 90 capsule 0  . estradiol (ESTRACE) 0.5 MG tablet TAKE 1 TABLET DAILY 90 tablet 1  . eszopiclone (LUNESTA) 2 MG TABS tablet Take 1 tablet (2 mg total) by mouth at bedtime as needed for sleep. Take immediately before bedtime 30 tablet 0  . furosemide (LASIX) 20 MG tablet TAKE 1 TABLET  EVERY OTHER DAY 45 tablet 3  . linaclotide (LINZESS) 145 MCG CAPS capsule Take 1 capsule by mouth daily as needed (constipation).     . meclizine (ANTIVERT) 25 MG tablet Take 0.5 tablets (12.5 mg total) by mouth 3 (three) times daily as needed for dizziness or nausea. 60 tablet 0  . Multiple Vitamin (MULTIVITAMIN WITH MINERALS) TABS Take 1 tablet by mouth daily.    Marland Kitchen nystatin (MYCOSTATIN) 100000 UNIT/ML suspension Take 5 mLs (500,000 Units total) by mouth 4 (four) times daily. Swish for 30 seconds and swallow. 14 days 180 mL 1  . amoxicillin (AMOXIL) 500 MG capsule Take 1 capsule (500 mg total) by mouth 3 (three) times daily. 30 capsule 0   No facility-administered medications prior to visit.    Allergies  Allergen Reactions  . Cortisone Anaphylaxis  . Dilaudid [Hydromorphone Hcl] Nausea And Vomiting    Pt states she will start vomiting immediately for 6 hours  . Iodine Anaphylaxis  . Medrol [Methylprednisolone] Anaphylaxis  . Omnipaque [Iohexol] Anaphylaxis  . Prednisone Anaphylaxis and Swelling  . Shellfish Allergy Anaphylaxis  . Sulfa Drugs Cross Reactors Anaphylaxis  . Doxycycline     Thrush/itching  . Methylprednisolone Sodium Succ Swelling    Blood pressure drops immediately  . Sulfa Antibiotics Other (See Comments)    Stomach upset  . Augmentin [Amoxicillin-Pot Clavulanate] Nausea Only    Upset stomach (Has taken cephalexin in the past without problems)  . Codeine Rash and Other (See Comments)    Upset stomach  . Erythromycin Rash  .  Morphine And Related Nausea Only    nausea    ROS Review of Systems    Objective:    Physical Exam Constitutional:      Appearance: She is well-developed.  HENT:     Head: Normocephalic and atraumatic.     Right Ear: External ear normal.     Left Ear: Tympanic membrane, ear canal and external ear normal.     Ears:     Comments: Right TM blocked by cerumen.      Nose: Nose normal.     Mouth/Throat:     Pharynx: No oropharyngeal exudate or posterior oropharyngeal erythema.  Eyes:     Conjunctiva/sclera: Conjunctivae normal.     Pupils: Pupils are equal, round, and reactive to light.  Neck:     Thyroid: No thyromegaly.  Cardiovascular:     Rate and Rhythm: Normal rate and regular rhythm.     Heart sounds: Normal heart sounds.  Pulmonary:     Effort: Pulmonary effort is normal.     Breath sounds: Normal breath sounds. No wheezing.  Musculoskeletal:     Cervical back: Neck supple.  Lymphadenopathy:     Cervical: No cervical adenopathy.  Skin:    General: Skin is warm and dry.  Neurological:     Mental Status: She is alert and oriented to person, place, and time.     BP (!) 142/78   Pulse 64   Ht 5\' 3"  (1.6 m)   Wt 156 lb (70.8 kg)   SpO2 97%   BMI 27.63 kg/m  Wt Readings from Last 3 Encounters:  05/22/20 156 lb (70.8 kg)  04/26/20 155 lb (70.3 kg)  04/26/20 155 lb (70.3 kg)     There are no preventive care reminders to display for this patient.  There are no preventive care reminders to display for this patient.  Lab Results  Component Value Date   TSH 1.38 09/03/2019  Lab Results  Component Value Date   WBC 4.5 02/07/2020   HGB 12.5 02/07/2020   HCT 36.7 02/07/2020   MCV 88.6 02/07/2020   PLT 310 02/07/2020   Lab Results  Component Value Date   NA 137 02/29/2020   K 4.5 02/29/2020   CO2 28 02/29/2020   GLUCOSE 91 02/29/2020   BUN 9 02/29/2020   CREATININE 0.83 02/29/2020   BILITOT 0.5 02/29/2020   ALKPHOS 58 02/17/2019   AST 20  02/29/2020   ALT 13 02/29/2020   PROT 6.5 02/29/2020   ALBUMIN 4.3 02/17/2019   CALCIUM 8.5 (L) 02/29/2020   ANIONGAP 10 09/09/2019   Lab Results  Component Value Date   CHOL 122 02/29/2020   Lab Results  Component Value Date   HDL 47 (L) 02/29/2020   Lab Results  Component Value Date   LDLCALC 52 02/29/2020   Lab Results  Component Value Date   TRIG 145 02/29/2020   Lab Results  Component Value Date   CHOLHDL 2.6 02/29/2020   No results found for: HGBA1C    Assessment & Plan:   Problem List Items Addressed This Visit    None    Visit Diagnoses    Sore throat    -  Primary   Relevant Orders   BASIC METABOLIC PANEL WITH GFR   CBC with Differential/Platelet   CT SOFT TISSUE NECK WO CONTRAST   Neck pain on left side       Relevant Orders   BASIC METABOLIC PANEL WITH GFR   CBC with Differential/Platelet   CT SOFT TISSUE NECK WO CONTRAST   Impacted cerumen of right ear       Nasal congestion       Mass of left side of neck       Wheezing       Relevant Orders   DG Chest 2 View     Nasal congestion-feels the congestion is much better than it was but she still sounds very congested on exam today medical head and treat with azithromycin.  Sided neck pain-unclear etiology I do not palpate any distinct lymph nodes but she definitely has a swollen area just above the mid clavicle that is soft.  Its not hard or fluctuant.  But it is noticeably larger than the opposite side.  She says its been swollen since she is been sick she said for couple days she thought maybe it was going down but then it started to swell again this is really unusual so recommend move forward with a CT of the neck.  Can consider MRI if needed.  Impaction of cerumen of the right ear-irrigation performed.  Patient tolerated well.  Some of the wax was able to be removed manually as well but not completely.  Indication: Cerumen impaction of the ear(s) Medical necessity statement: On physical  examination, cerumen impairs clinically significant portions of the external auditory canal, and tympanic membrane. Noted obstructive, copious cerumen that cannot be removed without magnification and instrumentations Consent: Discussed benefits and risks of procedure and verbal consent obtained Procedure: Patient was prepped for the procedure. Utilized an otoscope to assess and take note of the ear canal, the tympanic membrane, and the presence, amount, and placement of the cerumen. Gentle water irrigation and soft plastic curette was utilized to remove cerumen.  Post procedure examination: shows cerumen was completely removed. Patient tolerated procedure well. The patient is made aware that they may experience temporary vertigo, temporary hearing loss, and temporary discomfort.  If these symptom last for more than 24 hours to call the clinic or proceed to the ED.    Meds ordered this encounter  Medications  . azithromycin (ZITHROMAX) 250 MG tablet    Sig: 2 Ttabs PO on Day 1, then one a day x 4 days.    Dispense:  6 tablet    Refill:  0    Follow-up: No follow-ups on file.    Beatrice Lecher, MD

## 2020-05-23 ENCOUNTER — Ambulatory Visit (INDEPENDENT_AMBULATORY_CARE_PROVIDER_SITE_OTHER): Payer: Medicare Other

## 2020-05-23 ENCOUNTER — Other Ambulatory Visit: Payer: Self-pay

## 2020-05-23 DIAGNOSIS — J029 Acute pharyngitis, unspecified: Secondary | ICD-10-CM

## 2020-05-23 DIAGNOSIS — R590 Localized enlarged lymph nodes: Secondary | ICD-10-CM | POA: Diagnosis not present

## 2020-05-23 DIAGNOSIS — M25512 Pain in left shoulder: Secondary | ICD-10-CM

## 2020-05-23 DIAGNOSIS — I708 Atherosclerosis of other arteries: Secondary | ICD-10-CM | POA: Diagnosis not present

## 2020-05-23 DIAGNOSIS — M542 Cervicalgia: Secondary | ICD-10-CM | POA: Diagnosis not present

## 2020-05-23 DIAGNOSIS — J351 Hypertrophy of tonsils: Secondary | ICD-10-CM | POA: Diagnosis not present

## 2020-05-23 LAB — CBC WITH DIFFERENTIAL/PLATELET
Absolute Monocytes: 456 cells/uL (ref 200–950)
Basophils Absolute: 48 cells/uL (ref 0–200)
Basophils Relative: 1 %
Eosinophils Absolute: 240 cells/uL (ref 15–500)
Eosinophils Relative: 5 %
HCT: 36.8 % (ref 35.0–45.0)
Hemoglobin: 12.2 g/dL (ref 11.7–15.5)
Lymphs Abs: 1661 cells/uL (ref 850–3900)
MCH: 29.8 pg (ref 27.0–33.0)
MCHC: 33.2 g/dL (ref 32.0–36.0)
MCV: 89.8 fL (ref 80.0–100.0)
MPV: 9.6 fL (ref 7.5–12.5)
Monocytes Relative: 9.5 %
Neutro Abs: 2395 cells/uL (ref 1500–7800)
Neutrophils Relative %: 49.9 %
Platelets: 291 10*3/uL (ref 140–400)
RBC: 4.1 10*6/uL (ref 3.80–5.10)
RDW: 12 % (ref 11.0–15.0)
Total Lymphocyte: 34.6 %
WBC: 4.8 10*3/uL (ref 3.8–10.8)

## 2020-05-23 LAB — BASIC METABOLIC PANEL WITH GFR
BUN: 12 mg/dL (ref 7–25)
CO2: 29 mmol/L (ref 20–32)
Calcium: 8.8 mg/dL (ref 8.6–10.4)
Chloride: 105 mmol/L (ref 98–110)
Creat: 0.82 mg/dL (ref 0.60–0.93)
GFR, Est African American: 82 mL/min/{1.73_m2} (ref >=60)
GFR, Est Non African American: 71 mL/min/{1.73_m2} (ref >=60)
Glucose, Bld: 81 mg/dL (ref 65–99)
Potassium: 4.5 mmol/L (ref 3.5–5.3)
Sodium: 141 mmol/L (ref 135–146)

## 2020-05-23 NOTE — Progress Notes (Signed)
All labs are normal. 

## 2020-05-25 ENCOUNTER — Encounter: Payer: Self-pay | Admitting: Family Medicine

## 2020-05-25 MED ORDER — CEFDINIR 300 MG PO CAPS: 300.0000 mg | ORAL_CAPSULE | Freq: Two times a day (BID) | ORAL | 0 refills | Status: DC

## 2020-05-25 NOTE — Telephone Encounter (Signed)
New Rx sent for [facility].  Please add Z-Pak to allergy list

## 2020-06-21 ENCOUNTER — Ambulatory Visit (INDEPENDENT_AMBULATORY_CARE_PROVIDER_SITE_OTHER): Payer: Medicare Other | Admitting: Family Medicine

## 2020-06-21 ENCOUNTER — Other Ambulatory Visit: Payer: Self-pay

## 2020-06-21 VITALS — BP 143/54 | HR 61 | Temp 98.3°F | Resp 18 | Ht 64.0 in | Wt 157.0 lb

## 2020-06-21 DIAGNOSIS — Z0000 Encounter for general adult medical examination without abnormal findings: Secondary | ICD-10-CM

## 2020-06-21 NOTE — Progress Notes (Signed)
Subjective:   Traci [name] is a 75 y.o. female who presents for Medicare Annual (Subsequent) preventive examination.  Review of Systems    Cardiac Risk Factors include: advanced age (>41men, >63 women);dyslipidemia     Objective:    Today's Vitals   06/21/20 1047  BP: (!) 143/54  Pulse: 61  Resp: 18  Temp: 98.3 F (36.8 C)  TempSrc: Oral  SpO2: 97%  Weight: 157 lb 0.6 oz (71.2 kg)  Height: 5\' 4"  (1.626 m)   Body mass index is 26.96 kg/m.  Advanced Directives 06/21/2020 09/09/2019 06/21/2019 05/30/2019 12/16/2018 06/15/2018 07/20/2017  Does Patient Have a Medical Advance Directive? Yes Yes Yes No Yes Yes No  Type of Paramedic of [place];Living will [place];Living will [place];Living will - [place];Living will [place];Living will -  Does patient want to make changes to medical advance directive? No - Patient declined - No - Patient declined - No - Patient declined No - Patient declined -  Copy of [place] in Chart? Yes - validated most recent copy scanned in chart (See row information) No - copy requested No - copy requested - - No - copy requested -  Would patient like information on creating a medical advance directive? No - Patient declined No - Patient declined - No - Patient declined - - No - Patient declined  Pre-existing out of facility DNR order (yellow form or pink MOST form) - - - - - - -    Current Medications (verified) Outpatient Encounter Medications as of 06/21/2020  Medication Sig  . aspirin EC 81 MG tablet Take 81 mg by mouth daily.  Marland Kitchen atenolol (TENORMIN) 25 MG tablet TAKE 1 TABLET DAILY  . atorvastatin (LIPITOR) 10 MG tablet TAKE 1 TABLET DAILY AT 6 P.M.  . candesartan (ATACAND) 16 MG tablet TAKE ONE-HALF (1/2) TABLET DAILY  . cholecalciferol (VITAMIN D) 1000 UNITS tablet Take 1,000 Units by mouth daily.  . clopidogrel  (PLAVIX) 75 MG tablet TAKE 1 TABLET DAILY  . clorazepate (TRANXENE) 7.5 MG tablet TAKE 1 TABLET DAILY AS NEEDED  . cyanocobalamin 1000 MCG tablet Take 100 mcg by mouth daily.  Marland Kitchen EPINEPHrine (EPIPEN 2-PAK) 0.3 mg/0.3 mL IJ SOAJ injection Inject 0.3 mLs (0.3 mg total) into the muscle as needed (for allergic reaction).  Marland Kitchen esomeprazole (NEXIUM) 40 MG capsule Take 1 capsule (40 mg total) by mouth daily. Take 1 tab daily (Patient taking differently: Take 40 mg by mouth daily.)  . estradiol (ESTRACE) 0.5 MG tablet TAKE 1 TABLET DAILY  . eszopiclone (LUNESTA) 2 MG TABS tablet Take 1 tablet (2 mg total) by mouth at bedtime as needed for sleep. Take immediately before bedtime  . furosemide (LASIX) 20 MG tablet TAKE 1 TABLET EVERY OTHER DAY  . linaclotide (LINZESS) 145 MCG CAPS capsule Take 1 capsule by mouth daily as needed (constipation).   . meclizine (ANTIVERT) 25 MG tablet Take 0.5 tablets (12.5 mg total) by mouth 3 (three) times daily as needed for dizziness or nausea.  . Multiple Vitamin (MULTIVITAMIN WITH MINERALS) TABS Take 1 tablet by mouth daily.  . cefdinir (OMNICEF) 300 MG capsule Take 1 capsule (300 mg total) by mouth 2 (two) times daily. (Patient not taking: Reported on 06/21/2020)  . nystatin (MYCOSTATIN) 100000 UNIT/ML suspension Take 5 mLs (500,000 Units total) by mouth 4 (four) times daily. Swish for 30 seconds and swallow. 14 days (Patient not taking:  Reported on 06/21/2020)   No facility-administered encounter medications on file as of 06/21/2020.    Allergies (verified) Cortisone, Dilaudid [hydromorphone hcl], Iodine, Medrol [methylprednisolone], Omnipaque [iohexol], Prednisone, Shellfish allergy, Sulfa drugs cross reactors, Z-pak [azithromycin], Doxycycline, Ivp dye [iodinated diagnostic agents], Methylprednisolone sodium succ, Sulfa antibiotics, Augmentin [amoxicillin-pot clavulanate], Codeine, Erythromycin, and Morphine and related   History: Past Medical History:  Diagnosis Date  .  Anxiety   . Atrial fibrillation ([place])   . CAD (coronary artery disease)   . Claudication ([place]) 01/06/2008   Lower extremity dopplers - no evidence of arterial insufficiency, normal exam  . Coronary artery disease   . GERD (gastroesophageal reflux disease)   . Hyperlipidemia   . Hypertension 11/30/2010   echo- EF 55%; normal w/ mildly sclerotic aortic valve  . Hypertension 11/05/11   renal dopplers - celiac artery and SMA >50% diameter reduction, R renal artery - mildly elevated velocities 1-59% diameter reduction, L renal artery normal  . Nonspecific ST-T wave electrocardiographic changes 03/26/2011   R/Lmv - EF 74%, normal perfusion all regions, ST depression w/ Lexiscan infusion w/o assoc angina  . Osteopenia   . PAD (peripheral artery disease) ([place])   . Peripheral vascular disease ([place])   . PVD (peripheral vascular disease) ([place]) 11/05/2011   doppler - R/L brachial pressures essentially equal w/o inflow disease; L sublclavian/CCA bypass graft demonstrates patent flow, no evidence of significant stenosis  . Sigmoid diverticulitis    Past Surgical History:  Procedure Laterality Date  . ABDOMINAL AORTIC ANEURYSM REPAIR N/A 01/27/2014   Procedure: AORTO-SUPERIOR MESENTERIC ARTERY BYPASS GRAFT;  Surgeon: Angelia Mould, MD;  Location: [facility];  Service: Vascular;  Laterality: N/A;  . ABDOMINAL HYSTERECTOMY    . APPENDECTOMY    . CARDIAC CATHETERIZATION  06/03/2008   60% LAD, involving D2, borderline significant by IVUS, medical therapy. CFX, RCA OK.  Marland Kitchen CATARACT EXTRACTION    . CHOLECYSTECTOMY    . EYE SURGERY     retinal surgery  . SPINE SURGERY  Feb. 2011  . [place]   X's  several  . SUBCLAVIAN ARTERY STENT Left 09/20/2008   LSA ISR 7x56mm Cordis Genesis on Opta premount, reduction from 80% to 0%  . subclavian artery stents  01/23/2010   Left carotid to subclavian artery Bypass  . VISCERAL ANGIOGRAM  01/17/2014   Procedure: VISCERAL ANGIOGRAM;  Surgeon:  Angelia Mould, MD;  [facility] C[facility] LAB;  Service: Cardiovascular;;   Family History  Problem Relation Age of Onset  . Heart disease Father 7       Heart Disease before age 29  . Kidney disease Father   . Heart attack Father   . Hyperlipidemia Father   . Hypertension Father   . Kidney failure Mother   . Diabetes Maternal Grandmother   . Heart disease Paternal Grandfather    Social History   Socioeconomic History  . Marital status: Widowed    Spouse name: Not on file  . Number of children: 1  . Years of education: 44  . Highest education level: Bachelor's degree (e.g., BA, AB, BS)  Occupational History  . Occupation: retired    Comment: Marine scientist  Tobacco Use  . Smoking status: Former Smoker    Packs/day: 0.25    Years: 20.00    Pack years: 5.00    Types: Cigarettes    Quit date: 09/30/1993    Years since quitting: 26.7  . Smokeless tobacco: Never Used  Vaping Use  .  Vaping Use: Never used  Substance and Sexual Activity  . Alcohol use: No  . Drug use: No  . Sexual activity: Not Currently  Other Topics Concern  . 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 No caffeine use. Likes to go dancing once a week. Likes play online games such as scrabble.    Social Determinants of Health   Financial Resource Strain: Low Risk   . Difficulty of Paying Living Expenses: Not hard at all  Food Insecurity: No Food Insecurity  . Worried About Charity fundraiser in the Last Year: Never true  . Ran Out of Food in the Last Year: Never true  Transportation Needs: No Transportation Needs  . Lack of Transportation (Medical): No  . Lack of Transportation (Non-Medical): No  Physical Activity: Inactive  . Days of Exercise per Week: 0 days  . Minutes of Exercise per Session: 0 min  Stress: No Stress Concern Present  . Feeling of Stress : Not at all  Social Connections: Moderately Isolated  . Frequency of Communication with  Friends and Family: More than three times a week  . Frequency of Social Gatherings with Friends and Family: More than three times a week  . Attends Religious Services: Never  . Active Member of Clubs or Organizations: Yes  . Attends Archivist Meetings: More than 4 times per year  . Marital Status: Widowed    Tobacco Counseling Counseling given: Not Answered   Clinical Intake:  Pre-visit preparation completed: Yes  Pain : No/denies pain     BMI - recorded: 26.96 Nutritional Status: BMI 25 -29 Overweight Nutritional Risks: None Diabetes: No  How often do you need to have someone help you when you read instructions, pamphlets, or other written materials from your doctor or pharmacy?: 1 - Never What is the last grade level you completed in school?: Bachelor's Degree  Diabetic? No  Interpreter Needed?: No  Information entered by :: Tinnie Gens, RN   Activities of Daily Living In your present state of health, do you have any difficulty performing the following activities: 06/21/2020  Hearing? N  Vision? N  Difficulty concentrating or making decisions? N  Walking or climbing stairs? N  Dressing or bathing? N  Doing errands, shopping? N  Preparing Food and eating ? N  Using the Toilet? N  In the past six months, have you accidently leaked urine? N  Do you have problems with loss of bowel control? N  Managing your Medications? N  Managing your Finances? N  Housekeeping or managing your Housekeeping? N  Some recent data might be hidden    Patient Care Team: Hali Marry, MD as PCP - General (Family Medicine) Croitoru, Dani Gobble, MD as PCP - Cardiology (Cardiology) Erline Levine, MD as Consulting Physician (Neurosurgery) Janie Morning, MD (Gastroenterology)  Indicate any recent Medical Services you may have received from other than [facility] providers in the past year (date may be approximate).     Assessment:   This is a routine wellness  examination for [place].  Hearing/Vision screen No exam data present  Dietary issues and exercise activities discussed: Current Exercise Habits: The patient does not participate in regular exercise at present, Exercise limited by: orthopedic condition(s) (due to back problems.)  Goals    . Patient Stated     Patient states wants to be able to go out and travel again.     . Patient Stated  06/21/2020 AWV Goal: Exercise for General Health   Patient will verbalize understanding of the benefits of increased physical activity:  Exercising regularly is important. It will improve your overall fitness, flexibility, and endurance.  Regular exercise also will improve your overall health. It can help you control your weight, reduce stress, and improve your bone density.  Over the next year, patient will increase physical activity as tolerated with a goal of at least 150 minutes of moderate physical activity per week.   You can tell that you are exercising at a moderate intensity if your heart starts beating faster and you start breathing faster but can still hold a conversation.  Moderate-intensity exercise ideas include:  Walking 1 mile (1.6 km) in about 15 minutes  Biking  Hiking  Golfing  Dancing  Water aerobics  Patient will verbalize understanding of everyday activities that increase physical activity by providing examples like the following: ? Yard work, such as: ? Pushing a Conservation officer, nature ? Raking and bagging leaves ? Washing your car ? Pushing a stroller ? Shoveling snow ? Gardening ? Washing windows or floors  Patient will be able to explain general safety guidelines for exercising:   Before you start a new exercise program, talk with your health care provider.  Do not exercise so much that you hurt yourself, feel dizzy, or get very short of breath.  Wear comfortable clothes and wear shoes with good support.  Drink plenty of water while you exercise to prevent  dehydration or heat stroke.  Work out until your breathing and your heartbeat get faster.       Depression Screen PHQ 2/9 Scores 06/21/2020 06/21/2019 01/15/2019 06/15/2018 10/30/2017 08/22/2016 09/23/2014  PHQ - 2 Score 0 0 0 0 0 0 0  PHQ- 9 Score 0 - 0 - - - -    Fall Risk Fall Risk  06/21/2020 06/21/2019 05/11/2019 06/15/2018 10/30/2017  Falls in the past year? 0 0 0 1 No  Comment - - Emmi Telephone Survey: data to providers prior to load - -  Number falls in past yr: 0 0 - - -  Injury with Fall? 0 0 - - -  Risk for fall due to : - No Fall Risks - - -  Follow up Falls evaluation completed Falls prevention discussed - Falls prevention discussed -    FALL RISK PREVENTION PERTAINING TO THE HOME:  Any stairs in or around the home? No  If so, are there any without handrails? No  Home free of loose throw rugs in walkways, pet beds, electrical cords, etc? Yes  Adequate lighting in your home to reduce risk of falls? Yes   Cognitive Function:     6CIT Screen 06/21/2020 06/21/2019 06/15/2018 08/22/2016  What Year? 0 points 0 points 0 points 0 points  What month? 0 points 0 points 0 points 0 points  What time? 0 points 0 points 0 points 0 points  Count back from 20 0 points 0 points 0 points 0 points  Months in reverse 0 points 0 points 0 points 0 points  Repeat phrase 0 points 0 points 2 points 0 points  Total Score 0 0 2 0    Immunizations Immunization History  Administered Date(s) Administered  . Fluad Quad(high Dose 65+) 03/01/2019, 02/28/2020  . Influenza, High Dose Seasonal PF 03/25/2018  . Influenza, Seasonal, Injecte, Preservative Fre 03/01/2019  . Influenza-Unspecified 03/02/2015, 03/15/2016, 02/21/2017  . Lyme Disease 03/20/2009  . PFIZER SARS-COV-2 Vaccination 07/22/2019, 08/16/2019, 03/15/2020  . Pneumococcal  Conjugate-13 08/30/2014  . Pneumococcal Polysaccharide-23 10/10/2011, 06/17/2012  . Tdap 06/17/2010, 04/08/2011  . Zoster 03/22/2012, 06/17/2012    TDAP status: Up to  date. Next TDAP is due in October, 2022.  Flu Vaccine status: Up to date  Pneumococcal vaccine status: Up to date  Covid-19 vaccine status: Completed vaccines  Qualifies for Shingles Vaccine? No   Zostavax completed Yes   Shingrix Completed?: Yes  Screening Tests Health Maintenance  Topic Date Due  . COLONOSCOPY (Pts 45-19yrs Insurance coverage will need to be confirmed)  07/16/2020  . COVID-19 Vaccine (4 - Booster for Pfizer series) 09/12/2020  . TETANUS/TDAP  04/07/2021  . MAMMOGRAM  01/04/2022  . DEXA SCAN  04/05/2022  . INFLUENZA VACCINE  Completed  . Hepatitis C Screening  Completed  . PNA vac Low Risk Adult  Completed    Health Maintenance  There are no preventive care reminders to display for this patient.  Colorectal cancer screening: Type of screening: CT Colonography. Completed 07/12/2015. Repeat every 5 years . Patient is due and has been in contact with her GI doctor to get it scheduled. Patient states she might require a prior authorization for the virtual colonoscopy.  Mammogram status: Completed 01/05/20. Next one is due in two years.  Bone Density status: Completed 04/05/20. Results reflect: Bone density results: NORMAL. Repeat every 2 years.  Lung Cancer Screening: (Low Dose CT Chest recommended if Age 39-80 years, 30 pack-year currently smoking OR have quit w/in 15years.) does not qualify.   Lung Cancer Screening Referral: No  Additional Screening:  Hepatitis C Screening: does not qualify; Completed Yes  Vision Screening: Recommended annual ophthalmology exams for early detection of glaucoma and other disorders of the eye. Is the patient up to date with their annual eye exam?  Yes  Who is the provider or what is the name of the office in which the patient attends annual eye exams? Dr. Allyne Gee If pt is not established with a provider, would they like to be referred to a provider to establish care? No .   Dental Screening: Recommended annual dental  exams for proper oral hygiene  Community Resource Referral / Chronic Care Management: CRR required this visit?  No   CCM required this visit?  No      Plan:     I have personally reviewed and noted the following in the patient's chart:   . Medical and social history . Use of alcohol, tobacco or illicit drugs  . Current medications and supplements . Functional ability and status . Nutritional status . Physical activity . Advanced directives . List of other physicians . Hospitalizations, surgeries, and ER visits in previous 12 months . Vitals . Screenings to include cognitive, depression, and falls . Referrals and appointments  In addition, I have reviewed and discussed with patient certain preventive protocols, quality metrics, and best practice recommendations. A written personalized care plan for preventive services as well as general preventive health recommendations were provided to patient.     Modesto Charon, RN   06/21/2020

## 2020-06-21 NOTE — Patient Instructions (Addendum)
Health Maintenance, Female Adopting a healthy lifestyle and getting preventive care are important in promoting health and wellness. Ask your health care provider about:  The right schedule for you to have regular tests and exams.  Things you can do on your own to prevent diseases and keep yourself healthy. What should I know about diet, weight, and exercise? Eat a healthy diet   Eat a diet that includes plenty of vegetables, fruits, low-fat dairy products, and lean protein.  Do not eat a lot of foods that are high in solid fats, added sugars, or sodium. Maintain a healthy weight Body mass index (BMI) is used to identify weight problems. It estimates body fat based on height and weight. Your health care provider can help determine your BMI and help you achieve or maintain a healthy weight. Get regular exercise Get regular exercise. This is one of the most important things you can do for your health. Most adults should:  Exercise for at least 150 minutes each week. The exercise should increase your heart rate and make you sweat (moderate-intensity exercise).  Do strengthening exercises at least twice a week. This is in addition to the moderate-intensity exercise.  Spend less time sitting. Even light physical activity can be beneficial. Watch cholesterol and blood lipids Have your blood tested for lipids and cholesterol at 74 years of age, then have this test every 5 years. Have your cholesterol levels checked more often if:  Your lipid or cholesterol levels are high.  You are older than 74 years of age.  You are at high risk for heart disease. What should I know about cancer screening? Depending on your health history and family history, you may need to have cancer screening at various ages. This may include screening for:  Breast cancer.  Cervical cancer.  Colorectal cancer.  Skin cancer.  Lung cancer. What should I know about heart disease, diabetes, and high blood  pressure? Blood pressure and heart disease  High blood pressure causes heart disease and increases the risk of stroke. This is more likely to develop in people who have high blood pressure readings, are of African descent, or are overweight.  Have your blood pressure checked: ? Every 3-5 years if you are 54-9 years of age. ? Every year if you are 69 years old or older. Diabetes Have regular diabetes screenings. This checks your fasting blood sugar level. Have the screening done:  Once every three years after age 36 if you are at a normal weight and have a low risk for diabetes.  More often and at a younger age if you are overweight or have a high risk for diabetes. What should I know about preventing infection? Hepatitis B If you have a higher risk for hepatitis B, you should be screened for this virus. Talk with your health care provider to find out if you are at risk for hepatitis B infection. Hepatitis C Testing is recommended for:  Everyone born from [birth year] through 1964.  Anyone with known risk factors for hepatitis C. Sexually transmitted infections (STIs)  Get screened for STIs, including gonorrhea and chlamydia, if: ? You are sexually active and are younger than 74 years of age. ? You are older than 74 years of age and your health care provider tells you that you are at risk for this type of infection. ? Your sexual activity has changed since you were last screened, and you are at increased risk for chlamydia or gonorrhea. Ask your health care provider  if you are at risk.  Ask your health care provider about whether you are at high risk for HIV. Your health care provider may recommend a prescription medicine to help prevent HIV infection. If you choose to take medicine to prevent HIV, you should first get tested for HIV. You should then be tested every 3 months for as long as you are taking the medicine. Pregnancy  If you are about to stop having your period (premenopausal) and  you may become pregnant, seek counseling before you get pregnant.  Take 400 to 800 micrograms (mcg) of folic acid every day if you become pregnant.  Ask for birth control (contraception) if you want to prevent pregnancy. Osteoporosis and menopause Osteoporosis is a disease in which the bones lose minerals and strength with aging. This can result in bone fractures. If you are 77 years old or older, or if you are at risk for osteoporosis and fractures, ask your health care provider if you should:  Be screened for bone loss.  Take a calcium or vitamin D supplement to lower your risk of fractures.  Be given hormone replacement therapy (HRT) to treat symptoms of menopause. Follow these instructions at home: Lifestyle  Do not use any products that contain nicotine or tobacco, such as cigarettes, e-cigarettes, and chewing tobacco. If you need help quitting, ask your health care provider.  Do not use street drugs.  Do not share needles.  Ask your health care provider for help if you need support or information about quitting drugs. Alcohol use  Do not drink alcohol if: ? Your health care provider tells you not to drink. ? You are pregnant, may be pregnant, or are planning to become pregnant.  If you drink alcohol: ? Limit how much you use to 0-1 drink a day. ? Limit intake if you are breastfeeding.  Be aware of how much alcohol is in your drink. In the [place] one drink equals one 12 oz bottle of beer (355 mL), one 5 oz glass of wine (148 mL), or one 1 oz glass of hard liquor (44 mL). General instructions  Schedule regular health, dental, and eye exams.  Stay current with your vaccines.  Tell your health care provider if: ? You often feel depressed. ? You have ever been abused or do not feel safe at home. Summary  Adopting a healthy lifestyle and getting preventive care are important in promoting health and wellness.  Follow your health care provider's instructions about healthy  diet, exercising, and getting tested or screened for diseases.  Follow your health care provider's instructions on monitoring your cholesterol and blood pressure. This information is not intended to replace advice given to you by your health care provider. Make sure you discuss any questions you have with your health care provider. Document Revised: 05/27/2018 Document Reviewed: 05/27/2018 Elsevier Patient Education  2020 [employer].   MEDICARE Traci [name] VISIT Health Maintenance Summary and Written Plan of Care  Traci [name] ,  Thank you for allowing me to perform your Medicare Annual Wellness Visit and for your ongoing commitment to your health.   Health Maintenance & Immunization History Health Maintenance  Topic Date Due  . COLONOSCOPY (Pts 45-35yrs Insurance coverage will need to be confirmed)  07/16/2020  . COVID-19 Vaccine (4 - Booster for Pfizer series) 09/12/2020  . TETANUS/TDAP  04/07/2021  . MAMMOGRAM  01/04/2022  . DEXA SCAN  04/05/2022  . INFLUENZA VACCINE  Completed  . Hepatitis C Screening  Completed  .  PNA vac Low Risk Adult  Completed   Immunization History  Administered Date(s) Administered  . Fluad Quad(high Dose 65+) 03/01/2019, 02/28/2020  . Influenza, High Dose Seasonal PF 03/25/2018  . Influenza, Seasonal, Injecte, Preservative Fre 03/01/2019  . Influenza-Unspecified 03/02/2015, 03/15/2016, 02/21/2017  . Lyme Disease 03/20/2009  . PFIZER SARS-COV-2 Vaccination 07/22/2019, 08/16/2019, 03/15/2020  . Pneumococcal Conjugate-13 08/30/2014  . Pneumococcal Polysaccharide-23 10/10/2011, 06/17/2012  . Tdap 06/17/2010, 04/08/2011  . Zoster 03/22/2012, 06/17/2012    These are the patient goals that we discussed: Goals Addressed            This Visit's Progress   . Patient Stated       06/21/2020 AWV Goal: Exercise for General Health   Patient will verbalize understanding of the benefits of increased physical activity:  Exercising  regularly is important. It will improve your overall fitness, flexibility, and endurance.  Regular exercise also will improve your overall health. It can help you control your weight, reduce stress, and improve your bone density.  Over the next year, patient will increase physical activity as tolerated with a goal of at least 150 minutes of moderate physical activity per week.   You can tell that you are exercising at a moderate intensity if your heart starts beating faster and you start breathing faster but can still hold a conversation.  Moderate-intensity exercise ideas include:  Walking 1 mile (1.6 km) in about 15 minutes  Biking  Hiking  Golfing  Dancing  Water aerobics  Patient will verbalize understanding of everyday activities that increase physical activity by providing examples like the following: ? Yard work, such as: ? Pushing a Conservation officer, nature ? Raking and bagging leaves ? Washing your car ? Pushing a stroller ? Shoveling snow ? Gardening ? Washing windows or floors  Patient will be able to explain general safety guidelines for exercising:   Before you start a new exercise program, talk with your health care provider.  Do not exercise so much that you hurt yourself, feel dizzy, or get very short of breath.  Wear comfortable clothes and wear shoes with good support.  Drink plenty of water while you exercise to prevent dehydration or heat stroke.  Work out until your breathing and your heartbeat get faster.         This is a list of Health Maintenance Items that are overdue or due now: There are no preventive care reminders to display for this patient.   Orders/Referrals Placed Today: No orders of the defined types were placed in this encounter.  (Contact our referral department at [phone] if you have not spoken with someone about your referral appointment within the next 5 days)    Follow-up Plan

## 2020-06-22 ENCOUNTER — Encounter: Payer: Self-pay | Admitting: Family Medicine

## 2020-07-11 ENCOUNTER — Other Ambulatory Visit: Payer: Self-pay | Admitting: Cardiovascular Disease

## 2020-07-12 ENCOUNTER — Encounter: Payer: Self-pay | Admitting: Family Medicine

## 2020-07-14 NOTE — Telephone Encounter (Signed)
Recommend that she try to separate from him as much as possible I would recommend that she get tested if at any point she develops symptoms.  If she is completely asymptomatic then I would hold off.  And she definitely needs to wear a mask if going out, since she has been exposed to him.

## 2020-07-17 ENCOUNTER — Encounter: Payer: Self-pay | Admitting: Family Medicine

## 2020-07-17 ENCOUNTER — Telehealth (INDEPENDENT_AMBULATORY_CARE_PROVIDER_SITE_OTHER): Payer: Medicare Other | Admitting: Family Medicine

## 2020-07-17 DIAGNOSIS — Z20822 Contact with and (suspected) exposure to covid-19: Secondary | ICD-10-CM | POA: Diagnosis not present

## 2020-07-17 NOTE — Progress Notes (Signed)
sxs began Friday. Her son tested positve on last Monday.   She is concerned because she isn't able to swallow and feels like this has moved into her chest and back towards the middle is hurting and she has a dry non-productive cough. She has been taking tylenol and throat lozenges. She has been drinking more water.   No fever. She has had some sweats and chills mostly at night. And an episode of diarrhea last night. She took a covid test yesterday at [facility] on [address].

## 2020-07-17 NOTE — Progress Notes (Signed)
Virtual Visit via Telephone Note  I connected with Traci [name] on 07/17/20 at  1:00 PM EST by telephone and verified that I am speaking with the correct person using two identifiers.   I discussed the limitations, risks, security and privacy concerns of performing an evaluation and management service by telephone and the availability of in person appointments. I also discussed with the patient that there may be a patient responsible charge related to this service. The patient expressed understanding and agreed to proceed.  Patient location: at home Provider loccation: In office   Subjective:    CC: COVID sxs   HPI: sxs began Friday. Her son tested positve on last Monday.   She is concerned because she isn't able to swallow and feels like this has moved into her chest and back towards the middle is hurting and she has a dry non-productive cough. Throat has been sore and feels swollen. She has been taking tylenol and throat lozenges. She has been drinking more water.  No fever. She has had some sweats and chills mostly at night. And an episode of diarrhea last night. She took a covid test yesterday at [facility] on [address]. Your at 98%.  Vit D, Vitamin D, Zinc.   Sweats and chills at night.  Using Gatorade. She has lost weight.   Past medical history, Surgical history, Family history not pertinant except as noted below, Social history, Allergies, and medications have been entered into the medical record, reviewed, and corrections made.   Review of Systems: No fevers, chills, night sweats, weight loss, chest pain, or shortness of breath.   Objective:    General: Speaking clearly in complete sentences without any shortness of breath.  Alert and oriented x3.  Normal judgment. No apparent acute distress.    Impression and Recommendations:    COVID-19: she is stable. No SOB, oxygen is OK.  Continue OTC meds.  She will call me if she actually tests positive and we can refer for  consult for possible antibody infusion.  Discussed symptomatic care. Discussed warning signs and sxs if not better.      I discussed the assessment and treatment plan with the patient. The patient was provided an opportunity to ask questions and all were answered. The patient agreed with the plan and demonstrated an understanding of the instructions.   The patient was advised to call back or seek an in-person evaluation if the symptoms worsen or if the condition fails to improve as anticipated.  I provided 21 minutes of non-face-to-face time during this encounter.   Beatrice Lecher, MD

## 2020-07-19 ENCOUNTER — Encounter: Payer: Self-pay | Admitting: Family Medicine

## 2020-07-19 ENCOUNTER — Ambulatory Visit: Payer: Medicare Other | Admitting: Allergy

## 2020-07-19 DIAGNOSIS — U071 COVID-19: Secondary | ICD-10-CM

## 2020-07-19 NOTE — Telephone Encounter (Signed)
Just give her symptoms to monitor for.

## 2020-07-19 NOTE — Telephone Encounter (Signed)
We can send her name for referral.   (but since she has been vaccinated it will be unlikely)

## 2020-07-26 ENCOUNTER — Other Ambulatory Visit: Payer: Self-pay | Admitting: Family Medicine

## 2020-07-26 ENCOUNTER — Encounter: Payer: Self-pay | Admitting: Family Medicine

## 2020-07-26 DIAGNOSIS — G4700 Insomnia, unspecified: Secondary | ICD-10-CM

## 2020-07-27 MED ORDER — CLORAZEPATE DIPOTASSIUM 7.5 MG PO TABS: 7.5000 mg | ORAL_TABLET | Freq: Every day | ORAL | 0 refills | Status: DC | PRN

## 2020-07-27 NOTE — Addendum Note (Signed)
Addended byAnnamaria Helling on: 07/27/2020 09:20 AM   Modules accepted: Orders

## 2020-07-27 NOTE — Addendum Note (Signed)
Addended by: Annamaria Helling on: 07/27/2020 09:18 AM   Modules accepted: Orders

## 2020-07-27 NOTE — Telephone Encounter (Signed)
RX printed, needs to be signed by provider. Last filled in November. Please advise.

## 2020-08-01 ENCOUNTER — Other Ambulatory Visit: Payer: Self-pay | Admitting: Cardiovascular Disease

## 2020-08-28 ENCOUNTER — Encounter: Payer: Self-pay | Admitting: Family Medicine

## 2020-08-28 ENCOUNTER — Other Ambulatory Visit: Payer: Self-pay

## 2020-08-28 ENCOUNTER — Ambulatory Visit (INDEPENDENT_AMBULATORY_CARE_PROVIDER_SITE_OTHER): Payer: Medicare Other | Admitting: Family Medicine

## 2020-08-28 VITALS — BP 130/54 | HR 60 | Ht 64.0 in | Wt 157.0 lb

## 2020-08-28 DIAGNOSIS — G9332 Myalgic encephalomyelitis/chronic fatigue syndrome: Secondary | ICD-10-CM

## 2020-08-28 DIAGNOSIS — M5442 Lumbago with sciatica, left side: Secondary | ICD-10-CM

## 2020-08-28 DIAGNOSIS — G4700 Insomnia, unspecified: Secondary | ICD-10-CM | POA: Diagnosis not present

## 2020-08-28 DIAGNOSIS — F4321 Adjustment disorder with depressed mood: Secondary | ICD-10-CM | POA: Diagnosis not present

## 2020-08-28 DIAGNOSIS — I10 Essential (primary) hypertension: Secondary | ICD-10-CM | POA: Diagnosis not present

## 2020-08-28 DIAGNOSIS — K551 Chronic vascular disorders of intestine: Secondary | ICD-10-CM

## 2020-08-28 DIAGNOSIS — N1831 Chronic kidney disease, stage 3a: Secondary | ICD-10-CM | POA: Diagnosis not present

## 2020-08-28 DIAGNOSIS — I739 Peripheral vascular disease, unspecified: Secondary | ICD-10-CM | POA: Diagnosis not present

## 2020-08-28 DIAGNOSIS — U099 Post covid-19 condition, unspecified: Secondary | ICD-10-CM | POA: Diagnosis not present

## 2020-08-28 DIAGNOSIS — R5382 Chronic fatigue, unspecified: Secondary | ICD-10-CM

## 2020-08-28 MED ORDER — ZOLPIDEM TARTRATE 5 MG PO TABS: 5.0000 mg | ORAL_TABLET | Freq: Every evening | ORAL | 1 refills | Status: DC | PRN

## 2020-08-28 NOTE — Assessment & Plan Note (Signed)
Well controlled. Continue current regimen. Follow up in  6 mo  

## 2020-08-28 NOTE — Assessment & Plan Note (Signed)
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  They had recommended that she see an allergist to get tested so that she could potentially get injections. She called the allergies and they are not going to do testing.

## 2020-08-28 NOTE — Assessment & Plan Note (Signed)
Following renal function Q 6mo  

## 2020-08-28 NOTE — Assessment & Plan Note (Signed)
Refilled Ambien 

## 2020-08-28 NOTE — Assessment & Plan Note (Signed)
Currently on Plavix.  Doing well on the medication she does get some bruising but no excess bleeding.

## 2020-08-28 NOTE — Progress Notes (Signed)
Established Patient Office Visit  Subjective:  Patient ID: Traci [name], female    DOB: May 16, 1946  Age: 75 y.o. MRN: [id number]  CC:  Chief Complaint  Patient presents with  . Hypertension    HPI Traci [name] presents for   Hypertension- Pt denies chest pain, SOB, dizziness, or heart palpitations.  Taking meds as directed w/o problems.  Denies medication side effects.    F/U CKD 3 - NO recent changes.    F/U Insomnia - she is doing well with her Ambien. Would like a refill.    Had Tradewinds recently and she is starting to  feel better.  She says she still feels fatigued and does not have a lot of energy and her still just a slight dry cough but otherwise she is doing well she just wants to know how long it is going to be before the fatigue improve is  She says this has been a rough year for her. She has had several friends past and get bad news.  This has been stressful.    CAD/PVD-tolerating her Plavix well without any significant side effects she does get some bruising.  Can plan on talking to her GI about the Linzess.  She really takes is it more as needed and when she takes it she gets watery diarrhea.  But she is not using it on a scheduled basis.  She is planning on doing yearly stool test for colon cancer screening.  Past Medical History:  Diagnosis Date  . Anxiety   . Atrial fibrillation ([place])   . CAD (coronary artery disease)   . Claudication ([place]) 01/06/2008   Lower extremity dopplers - no evidence of arterial insufficiency, normal exam  . Coronary artery disease   . GERD (gastroesophageal reflux disease)   . Hyperlipidemia   . Hypertension 11/30/2010   echo- EF 55%; normal w/ mildly sclerotic aortic valve  . Hypertension 11/05/11   renal dopplers - celiac artery and SMA >50% diameter reduction, R renal artery - mildly elevated velocities 1-59% diameter reduction, L renal artery normal  . Nonspecific ST-T wave electrocardiographic changes  03/26/2011   R/Lmv - EF 74%, normal perfusion all regions, ST depression w/ Lexiscan infusion w/o assoc angina  . Osteopenia   . PAD (peripheral artery disease) ([place])   . Peripheral vascular disease ([place])   . PVD (peripheral vascular disease) ([place]) 11/05/2011   doppler - R/L brachial pressures essentially equal w/o inflow disease; L sublclavian/CCA bypass graft demonstrates patent flow, no evidence of significant stenosis  . Sigmoid diverticulitis     Past Surgical History:  Procedure Laterality Date  . ABDOMINAL AORTIC ANEURYSM REPAIR N/A 01/27/2014   Procedure: AORTO-SUPERIOR MESENTERIC ARTERY BYPASS GRAFT;  Surgeon: Angelia Mould, MD;  Location: [place];  Service: Vascular;  Laterality: N/A;  . ABDOMINAL HYSTERECTOMY    . APPENDECTOMY    . CARDIAC CATHETERIZATION  06/03/2008   60% LAD, involving D2, borderline significant by IVUS, medical therapy. CFX, RCA OK.  Marland Kitchen CATARACT EXTRACTION    . CHOLECYSTECTOMY    . EYE SURGERY     retinal surgery  . SPINE SURGERY  Feb. 2011  . [place]   X's  several  . SUBCLAVIAN ARTERY STENT Left 09/20/2008   LSA ISR 7x49mm Cordis Genesis on Opta premount, reduction from 80% to 0%  . subclavian artery stents  01/23/2010   Left carotid to subclavian artery Bypass  . VISCERAL ANGIOGRAM  01/17/2014   Procedure:  VISCERAL ANGIOGRAM;  Surgeon: Angelia Mould, MD;  Location: [facility] CATH LAB;  Service: Cardiovascular;;    Family History  Problem Relation Age of Onset  . Heart disease Father 78       Heart Disease before age 73  . Kidney disease Father   . Heart attack Father   . Hyperlipidemia Father   . Hypertension Father   . Kidney failure Mother   . Diabetes Maternal Grandmother   . Heart disease Paternal Grandfather     Social History   Socioeconomic History  . Marital status: Widowed    Spouse name: Not on file  . Number of children: 1  . Years of education: 54  . Highest education level: Bachelor's degree (e.g.,  BA, AB, BS)  Occupational History  . Occupation: retired    Comment: Marine scientist  Tobacco Use  . Smoking status: Former Smoker    Packs/day: 0.25    Years: 20.00    Pack years: 5.00    Types: Cigarettes    Quit date: 09/30/1993    Years since quitting: 26.9  . Smokeless tobacco: Never Used  Vaping Use  . Vaping Use: Never used  Substance and Sexual Activity  . Alcohol use: No  . Drug use: No  . Sexual activity: Not Currently  Other Topics Concern  . 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 No caffeine use. Likes to go dancing once a week. Likes play online games such as scrabble.    Social Determinants of Health   Financial Resource Strain: Low Risk   . Difficulty of Paying Living Expenses: Not hard at all  Food Insecurity: No Food Insecurity  . Worried About Charity fundraiser in the Last Year: Never true  . Ran Out of Food in the Last Year: Never true  Transportation Needs: No Transportation Needs  . Lack of Transportation (Medical): No  . Lack of Transportation (Non-Medical): No  Physical Activity: Inactive  . Days of Exercise per Week: 0 days  . Minutes of Exercise per Session: 0 min  Stress: No Stress Concern Present  . Feeling of Stress : Not at all  Social Connections: Moderately Isolated  . Frequency of Communication with Friends and Family: More than three times a week  . Frequency of Social Gatherings with Friends and Family: More than three times a week  . Attends Religious Services: Never  . Active Member of Clubs or Organizations: Yes  . Attends Archivist Meetings: More than 4 times per year  . Marital Status: Widowed  Intimate Partner Violence: Not At Risk  . Fear of Current or Ex-Partner: No  . Emotionally Abused: No  . Physically Abused: No  . Sexually Abused: No    Outpatient Medications Prior to Visit  Medication Sig Dispense Refill  . aspirin EC 81 MG tablet Take 81 mg by mouth daily.     Marland Kitchen atenolol (TENORMIN) 25 MG tablet TAKE 1 TABLET DAILY 90 tablet 3  . atorvastatin (LIPITOR) 10 MG tablet TAKE 1 TABLET DAILY AT 6 P.M. 90 tablet 3  . candesartan (ATACAND) 16 MG tablet TAKE ONE-HALF (1/2) TABLET DAILY 45 tablet 3  . cholecalciferol (VITAMIN D) 1000 UNITS tablet Take 1,000 Units by mouth daily.    . clopidogrel (PLAVIX) 75 MG tablet TAKE 1 TABLET DAILY 90 tablet 3  . clorazepate (TRANXENE) 7.5 MG tablet TAKE 1 TABLET DAILY AS NEEDED 90 tablet 0  .  clorazepate (TRANXENE) 7.5 MG tablet Take 1 tablet (7.5 mg total) by mouth daily as needed. 90 tablet 0  . cyanocobalamin 1000 MCG tablet Take 100 mcg by mouth daily.    Marland Kitchen EPINEPHrine (EPIPEN 2-PAK) 0.3 mg/0.3 mL IJ SOAJ injection Inject 0.3 mLs (0.3 mg total) into the muscle as needed (for allergic reaction). 2 Device 0  . esomeprazole (NEXIUM) 40 MG capsule Take 1 capsule (40 mg total) by mouth daily. Take 1 tab daily (Patient taking differently: Take 40 mg by mouth daily.) 90 capsule 0  . estradiol (ESTRACE) 0.5 MG tablet TAKE 1 TABLET DAILY 90 tablet 1  . furosemide (LASIX) 20 MG tablet TAKE 1 TABLET EVERY OTHER DAY 45 tablet 3  . linaclotide (LINZESS) 145 MCG CAPS capsule Take 1 capsule by mouth daily as needed (constipation).     . meclizine (ANTIVERT) 25 MG tablet Take 0.5 tablets (12.5 mg total) by mouth 3 (three) times daily as needed for dizziness or nausea. 60 tablet 0  . Multiple Vitamin (MULTIVITAMIN WITH MINERALS) TABS Take 1 tablet by mouth daily.    Marland Kitchen zolpidem (AMBIEN) 5 MG tablet Take 5 mg by mouth at bedtime. Take 1 tablet (5 mg total) by mouth at bedtime as needed for sleep.    . eszopiclone (LUNESTA) 2 MG TABS tablet Take 1 tablet (2 mg total) by mouth at bedtime as needed for sleep. Take immediately before bedtime 30 tablet 0   No facility-administered medications prior to visit.    Allergies  Allergen Reactions  . Cortisone Anaphylaxis  . Dilaudid [Hydromorphone Hcl] Nausea And Vomiting    Pt states she  will start vomiting immediately for 6 hours  . Iodine Anaphylaxis  . Medrol [Methylprednisolone] Anaphylaxis  . Omnipaque [Iohexol] Anaphylaxis  . Prednisone Anaphylaxis and Swelling  . Shellfish Allergy Anaphylaxis  . Sulfa Drugs Cross Reactors Anaphylaxis  . Z-Pak [Azithromycin] Swelling  . Doxycycline     Thrush/itching  . Ivp Dye [Iodinated Diagnostic Agents]     Other reaction(s): Unknown  . Methylprednisolone Sodium Succ Swelling    Blood pressure drops immediately  . Sulfa Antibiotics Other (See Comments)    Stomach upset  . Augmentin [Amoxicillin-Pot Clavulanate] Nausea Only    Upset stomach (Has taken cephalexin in the past without problems)  . Codeine Rash and Other (See Comments)    Upset stomach  . Erythromycin Rash  . Morphine And Related Nausea Only    nausea    ROS Review of Systems    Objective:    Physical Exam Constitutional:      Appearance: She is well-developed.  HENT:     Head: Normocephalic and atraumatic.     Right Ear: External ear normal.     Left Ear: External ear normal.  Cardiovascular:     Rate and Rhythm: Normal rate and regular rhythm.     Heart sounds: Normal heart sounds.  Pulmonary:     Effort: Pulmonary effort is normal.     Breath sounds: Normal breath sounds.  Skin:    General: Skin is warm and dry.  Neurological:     Mental Status: She is alert and oriented to person, place, and time.  Psychiatric:        Behavior: Behavior normal.     BP (!) 130/54   Pulse 60   Ht 5\' 4"  (1.626 m)   Wt 157 lb (71.2 kg)   SpO2 100%   BMI 26.95 kg/m  Wt Readings from Last 3 Encounters:  08/28/20  157 lb (71.2 kg)  06/21/20 157 lb 0.6 oz (71.2 kg)  05/22/20 156 lb (70.8 kg)     Health Maintenance Due  Topic Date Due  . COLONOSCOPY (Pts 45-37yrs Insurance coverage will need to be confirmed)  07/16/2020    There are no preventive care reminders to display for this patient.  Lab Results  Component Value Date   TSH 1.38  09/03/2019   Lab Results  Component Value Date   WBC 4.8 05/22/2020   HGB 12.2 05/22/2020   HCT 36.8 05/22/2020   MCV 89.8 05/22/2020   PLT 291 05/22/2020   Lab Results  Component Value Date   NA 141 05/22/2020   K 4.5 05/22/2020   CO2 29 05/22/2020   GLUCOSE 81 05/22/2020   BUN 12 05/22/2020   CREATININE 0.82 05/22/2020   BILITOT 0.5 02/29/2020   ALKPHOS 58 02/17/2019   AST 20 02/29/2020   ALT 13 02/29/2020   PROT 6.5 02/29/2020   ALBUMIN 4.3 02/17/2019   CALCIUM 8.8 05/22/2020   ANIONGAP 10 09/09/2019   Lab Results  Component Value Date   CHOL 122 02/29/2020   Lab Results  Component Value Date   HDL 47 (L) 02/29/2020   Lab Results  Component Value Date   LDLCALC 52 02/29/2020   Lab Results  Component Value Date   TRIG 145 02/29/2020   Lab Results  Component Value Date   CHOLHDL 2.6 02/29/2020   No results found for: HGBA1C    Assessment & Plan:   Problem List Items Addressed This Visit      Cardiovascular and Mediastinum   Peripheral arterial disease ([place]) (Chronic)   Hypertension (Chronic)    Well controlled. Continue current regimen. Follow up in  6 mo       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HCC) (Chronic)    Currently on Plavix.  Doing well on the medication she does get some bruising but no excess bleeding.        Nervous and Auditory   Acute bilateral low back pain with left-sided sciatica    The orthopedist that actually recommended that she see pain management but she is allergic to steroids.  They had recommended that she see an allergist to get tested so that she could potentially get injections. She called the allergies and they are not going to do testing.       Relevant Medications   zolpidem (AMBIEN) 5 MG tablet     Genitourinary   CKD (chronic kidney disease) stage 3, GFR 30-59 ml/min (HCC) - Primary    Following renal function Q 6 mo        Other   Insomnia    Refilled Ambien.        Relevant Medications   zolpidem  (AMBIEN) 5 MG tablet    Other Visit Diagnoses    Grief       Post-COVID chronic fatigue          Grief - discussed that I am happy to refer her for g grief counseling.  I seriously mean that.  If any point she feels like it is getting overwhelming please let me know.  She says being at home and being quarantined has been really challenging emotionally she is definitely more of a people person so she has been trying to get out a little bit and feels like that has been helpful.  COVID chronic fatigue-explained to her that sometimes it can be weeks to months.  It is already been  about 7 weeks status post Covid.  Meds ordered this encounter  Medications  . zolpidem (AMBIEN) 5 MG tablet    Sig: Take 1 tablet (5 mg total) by mouth at bedtime as needed for sleep.    Dispense:  90 tablet    Refill:  1    Follow-up: Return in about 6 months (around 02/28/2021).    Beatrice Lecher, MD

## 2020-09-02 ENCOUNTER — Other Ambulatory Visit: Payer: Self-pay | Admitting: Family Medicine

## 2020-09-12 DIAGNOSIS — I2510 Atherosclerotic heart disease of native coronary artery without angina pectoris: Secondary | ICD-10-CM | POA: Diagnosis not present

## 2020-09-12 DIAGNOSIS — I10 Essential (primary) hypertension: Secondary | ICD-10-CM | POA: Diagnosis not present

## 2020-09-12 DIAGNOSIS — Z888 Allergy status to other drugs, medicaments and biological substances status: Secondary | ICD-10-CM | POA: Diagnosis not present

## 2020-09-12 DIAGNOSIS — Z9049 Acquired absence of other specified parts of digestive tract: Secondary | ICD-10-CM | POA: Diagnosis not present

## 2020-09-12 DIAGNOSIS — Z20822 Contact with and (suspected) exposure to covid-19: Secondary | ICD-10-CM | POA: Diagnosis not present

## 2020-09-12 DIAGNOSIS — R0789 Other chest pain: Secondary | ICD-10-CM | POA: Diagnosis not present

## 2020-09-12 DIAGNOSIS — Z95818 Presence of other cardiac implants and grafts: Secondary | ICD-10-CM | POA: Diagnosis not present

## 2020-09-12 DIAGNOSIS — Z91013 Allergy to seafood: Secondary | ICD-10-CM | POA: Diagnosis not present

## 2020-09-12 DIAGNOSIS — I739 Peripheral vascular disease, unspecified: Secondary | ICD-10-CM | POA: Diagnosis not present

## 2020-09-12 DIAGNOSIS — I6523 Occlusion and stenosis of bilateral carotid arteries: Secondary | ICD-10-CM | POA: Diagnosis not present

## 2020-09-12 DIAGNOSIS — Z881 Allergy status to other antibiotic agents status: Secondary | ICD-10-CM | POA: Diagnosis not present

## 2020-09-12 DIAGNOSIS — R1013 Epigastric pain: Secondary | ICD-10-CM | POA: Diagnosis not present

## 2020-09-12 DIAGNOSIS — Z882 Allergy status to sulfonamides status: Secondary | ICD-10-CM | POA: Diagnosis not present

## 2020-09-12 DIAGNOSIS — R001 Bradycardia, unspecified: Secondary | ICD-10-CM | POA: Diagnosis not present

## 2020-09-12 DIAGNOSIS — Z79890 Hormone replacement therapy (postmenopausal): Secondary | ICD-10-CM | POA: Diagnosis not present

## 2020-09-12 DIAGNOSIS — Z7982 Long term (current) use of aspirin: Secondary | ICD-10-CM | POA: Diagnosis not present

## 2020-09-12 DIAGNOSIS — R079 Chest pain, unspecified: Secondary | ICD-10-CM | POA: Diagnosis not present

## 2020-09-12 DIAGNOSIS — Z880 Allergy status to penicillin: Secondary | ICD-10-CM | POA: Diagnosis not present

## 2020-09-12 DIAGNOSIS — Z79899 Other long term (current) drug therapy: Secondary | ICD-10-CM | POA: Diagnosis not present

## 2020-09-12 DIAGNOSIS — R0902 Hypoxemia: Secondary | ICD-10-CM | POA: Diagnosis not present

## 2020-09-12 DIAGNOSIS — Z7902 Long term (current) use of antithrombotics/antiplatelets: Secondary | ICD-10-CM | POA: Diagnosis not present

## 2020-09-12 DIAGNOSIS — R509 Fever, unspecified: Secondary | ICD-10-CM | POA: Diagnosis not present

## 2020-09-12 DIAGNOSIS — R2689 Other abnormalities of gait and mobility: Secondary | ICD-10-CM | POA: Diagnosis not present

## 2020-09-12 DIAGNOSIS — K559 Vascular disorder of intestine, unspecified: Secondary | ICD-10-CM | POA: Diagnosis not present

## 2020-09-12 DIAGNOSIS — I161 Hypertensive emergency: Secondary | ICD-10-CM | POA: Diagnosis not present

## 2020-09-12 DIAGNOSIS — E7849 Other hyperlipidemia: Secondary | ICD-10-CM | POA: Diagnosis not present

## 2020-09-12 DIAGNOSIS — Z885 Allergy status to narcotic agent status: Secondary | ICD-10-CM | POA: Diagnosis not present

## 2020-09-12 DIAGNOSIS — R0602 Shortness of breath: Secondary | ICD-10-CM | POA: Diagnosis not present

## 2020-09-12 DIAGNOSIS — Z87891 Personal history of nicotine dependence: Secondary | ICD-10-CM | POA: Diagnosis not present

## 2020-09-12 DIAGNOSIS — K55059 Acute (reversible) ischemia of intestine, part and extent unspecified: Secondary | ICD-10-CM | POA: Diagnosis not present

## 2020-09-12 DIAGNOSIS — G459 Transient cerebral ischemic attack, unspecified: Secondary | ICD-10-CM | POA: Diagnosis not present

## 2020-09-13 DIAGNOSIS — K559 Vascular disorder of intestine, unspecified: Secondary | ICD-10-CM | POA: Diagnosis not present

## 2020-09-13 DIAGNOSIS — G459 Transient cerebral ischemic attack, unspecified: Secondary | ICD-10-CM | POA: Diagnosis not present

## 2020-09-13 DIAGNOSIS — I161 Hypertensive emergency: Secondary | ICD-10-CM | POA: Diagnosis not present

## 2020-09-13 DIAGNOSIS — Z0189 Encounter for other specified special examinations: Secondary | ICD-10-CM | POA: Diagnosis not present

## 2020-09-13 DIAGNOSIS — I739 Peripheral vascular disease, unspecified: Secondary | ICD-10-CM | POA: Diagnosis not present

## 2020-09-13 DIAGNOSIS — E7849 Other hyperlipidemia: Secondary | ICD-10-CM | POA: Diagnosis not present

## 2020-09-13 DIAGNOSIS — I10 Essential (primary) hypertension: Secondary | ICD-10-CM | POA: Diagnosis not present

## 2020-09-14 DIAGNOSIS — I2510 Atherosclerotic heart disease of native coronary artery without angina pectoris: Secondary | ICD-10-CM | POA: Diagnosis not present

## 2020-09-14 DIAGNOSIS — G459 Transient cerebral ischemic attack, unspecified: Secondary | ICD-10-CM | POA: Diagnosis not present

## 2020-09-14 DIAGNOSIS — I161 Hypertensive emergency: Secondary | ICD-10-CM | POA: Diagnosis not present

## 2020-09-14 DIAGNOSIS — K559 Vascular disorder of intestine, unspecified: Secondary | ICD-10-CM | POA: Diagnosis not present

## 2020-09-14 DIAGNOSIS — I739 Peripheral vascular disease, unspecified: Secondary | ICD-10-CM | POA: Diagnosis not present

## 2020-09-14 DIAGNOSIS — R079 Chest pain, unspecified: Secondary | ICD-10-CM | POA: Diagnosis not present

## 2020-09-14 DIAGNOSIS — E7849 Other hyperlipidemia: Secondary | ICD-10-CM | POA: Diagnosis not present

## 2020-09-14 DIAGNOSIS — I10 Essential (primary) hypertension: Secondary | ICD-10-CM | POA: Diagnosis not present

## 2020-09-28 ENCOUNTER — Ambulatory Visit (INDEPENDENT_AMBULATORY_CARE_PROVIDER_SITE_OTHER): Payer: Medicare Other

## 2020-09-28 ENCOUNTER — Encounter: Payer: Self-pay | Admitting: Physician Assistant

## 2020-09-28 ENCOUNTER — Other Ambulatory Visit: Payer: Self-pay

## 2020-09-28 ENCOUNTER — Ambulatory Visit (INDEPENDENT_AMBULATORY_CARE_PROVIDER_SITE_OTHER): Payer: Medicare Other | Admitting: Physician Assistant

## 2020-09-28 VITALS — BP 136/58 | HR 58 | Temp 98.9°F | Resp 20 | Ht 64.0 in | Wt 156.0 lb

## 2020-09-28 DIAGNOSIS — M25572 Pain in left ankle and joints of left foot: Secondary | ICD-10-CM | POA: Diagnosis not present

## 2020-09-28 DIAGNOSIS — W19XXXA Unspecified fall, initial encounter: Secondary | ICD-10-CM | POA: Diagnosis not present

## 2020-09-28 DIAGNOSIS — S93492A Sprain of other ligament of left ankle, initial encounter: Secondary | ICD-10-CM | POA: Insufficient documentation

## 2020-09-28 DIAGNOSIS — I10 Essential (primary) hypertension: Secondary | ICD-10-CM

## 2020-09-28 DIAGNOSIS — M25562 Pain in left knee: Secondary | ICD-10-CM

## 2020-09-28 DIAGNOSIS — M7989 Other specified soft tissue disorders: Secondary | ICD-10-CM | POA: Diagnosis not present

## 2020-09-28 NOTE — Progress Notes (Signed)
Subjective:    Patient ID: Traci [name], female    DOB: [birth year], 75 y.o.   MRN: [id number]  HPI  Pt is a 75 yo female with HTN, CAD, PAD who presents to the clinic to follow up from hospital on 09/06/2020 for HTN crisis. She noticed her BP was 220 over 100 and went to ED. Work up of heart was done and normal. She did spike a fever one day and then resolved and BP got better. Suspected virus called sudden BP elevation. No medication adjustments made. Tested negative for covid/flu/RSV. Discharged after 3 days. Checking BP since been home and running 120s to 130s over 70s. Denies any CP, palpitations, headaches or vision changes.   On her way up her stairs from the hospital she slipped and fell hitting her left knee and inverting her left ankle. It has been very painful, swollen. She has been icing. Does seem to be some better but very swollen and ankle hurts to move foot side to side.   .. Active Ambulatory Problems    Diagnosis Date Noted  . 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 Atherosclerosis of other specified arteries 04/02/2012  . 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 Hypertension 12/06/2012  . Peripheral arterial disease ([place]) 12/06/2012  . CAD, cath 2009 12/06/2012  . Hyponatremia, improved holding diuretic 12/06/2012  . Hyperlipidemia 04/05/2013  . 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[place]) 01/16/2014  . 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 Irregular heartbeat 07/26/2014  . History of colonoscopy 07/17/2015  . Menopause 12/08/2015  . Acute bilateral low back pain with left-sided sciatica 06/06/2016  . Atypical nevus 08/22/2016  . Strain of right Achilles tendon 04/14/2017  . Primary osteoarthritis of first carpometacarpal joint of left hand 07/10/2017  . CKD (chronic kidney disease) stage 3, GFR 30-59 ml/min (HCC) 08/04/2017  . Osteoporosis 12/25/2017  . Atherosclerosis of aorta ([place]) 01/04/2018  . Hypocalcemia 06/03/2019  . Insomnia 08/10/2019  . Epigastric pain  08/11/2019  . Lymphocele of left arm 07/14/2012  . Irritable bowel syndrome 09/21/2013  . Allergic dermatitis 06/09/2011  . Edema 09/03/2019   Resolved Ambulatory Problems    Diagnosis Date Noted  . 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 Pre-syncope 12/06/2012  . Preoperative evaluation of a medical condition to rule out surgical contraindications (TAR required) 01/17/2014  . URI (upper respiratory infection) 06/22/2015  . Osteopenia 12/13/2015  . Protein-calorie malnutrition ([place]) 08/29/2016  . Cerumen debris on tympanic membrane 09/03/2019   Past Medical History:  Diagnosis Date  . Anxiety   . Atrial fibrillation ([place])   . CAD (coronary artery disease)   . Claudication ([place]) 01/06/2008  . Coronary artery disease   . GERD (gastroesophageal reflux disease)   . Nonspecific ST-T wave electrocardiographic changes 03/26/2011  . PAD (peripheral artery disease) ([place])   . Peripheral vascular disease ([place])   . PVD (peripheral vascular disease) ([place]) 11/05/2011  . Sigmoid diverticulitis         Review of Systems See HPI.     Objective:   Physical Exam Vitals reviewed.  Constitutional:      Appearance: Normal appearance.  Cardiovascular:     Rate and Rhythm: Normal rate.     Pulses: Normal pulses.  Pulmonary:     Effort: Pulmonary effort is normal.  Musculoskeletal:     Comments: Left knee:  Swollen with contusion noted below knee. Tender to palpation along the joint line.  Infrapatellar bursitis without warmth.  NROM. Strength 5/5.   Left ankle:  Difficultly side to side movement of foot.  Strength 4/5.  Tenderness over edema of anterior talofibular ligament. No erythema or warmth.  Tenderness over lateral malleolus.   Neurological:     Mental Status: She is alert.           Assessment & Plan:  Marland KitchenMarland KitchenAlga was seen today for follow-up.  Diagnoses and all orders for this visit:  Primary hypertension  Fall, initial encounter -     DG Ankle  Complete Left; Future -     DG Knee 4 Views W/Patella Left; Future  Acute pain of left knee -     DG Knee 4 Views W/Patella Left; Future  Acute left ankle pain -     DG Ankle Complete Left; Future  Sprain of anterior talofibular ligament of left ankle, initial encounter   BP better since home from hospital. No concerns. No symptoms. Follow up as needed.   Xray ordered for knee and ankle.  Placed in ASO brace for ankle.  Continue icing.  If no fracture start trying to rehab ankle.  If having persistent swelling and issues with ROM may need MRI to confirm no ligament tear.

## 2020-09-28 NOTE — Patient Instructions (Signed)
Get xray of ankle and knee.  Wear ankle support brace.

## 2020-10-02 NOTE — Progress Notes (Signed)
No sign of fracture, dislocation. If you continue to have problems bearing weight should see Dr. Darene Lamer, sports medicine, for MRI of ankle to look for any soft tissue injury.

## 2020-10-02 NOTE — Progress Notes (Signed)
[name],   No acute findings on xray of left knee.

## 2020-10-14 ENCOUNTER — Other Ambulatory Visit: Payer: Self-pay | Admitting: Family Medicine

## 2020-10-19 ENCOUNTER — Other Ambulatory Visit: Payer: Self-pay

## 2020-10-19 ENCOUNTER — Ambulatory Visit (INDEPENDENT_AMBULATORY_CARE_PROVIDER_SITE_OTHER): Payer: Medicare Other | Admitting: Cardiovascular Disease

## 2020-10-19 ENCOUNTER — Encounter: Payer: Self-pay | Admitting: Cardiovascular Disease

## 2020-10-19 VITALS — BP 130/72 | HR 63 | Ht 64.0 in | Wt 153.0 lb

## 2020-10-19 DIAGNOSIS — I2510 Atherosclerotic heart disease of native coronary artery without angina pectoris: Secondary | ICD-10-CM | POA: Diagnosis not present

## 2020-10-19 DIAGNOSIS — R002 Palpitations: Secondary | ICD-10-CM

## 2020-10-19 DIAGNOSIS — K551 Chronic vascular disorders of intestine: Secondary | ICD-10-CM | POA: Diagnosis not present

## 2020-10-19 DIAGNOSIS — I700 Atherosclerosis of aorta: Secondary | ICD-10-CM | POA: Diagnosis not present

## 2020-10-19 DIAGNOSIS — I10 Essential (primary) hypertension: Secondary | ICD-10-CM | POA: Diagnosis not present

## 2020-10-19 DIAGNOSIS — E7800 Pure hypercholesterolemia, unspecified: Secondary | ICD-10-CM | POA: Diagnosis not present

## 2020-10-19 DIAGNOSIS — I739 Peripheral vascular disease, unspecified: Secondary | ICD-10-CM

## 2020-10-19 DIAGNOSIS — I708 Atherosclerosis of other arteries: Secondary | ICD-10-CM | POA: Diagnosis not present

## 2020-10-19 NOTE — Progress Notes (Signed)
Patient ID: Traci [name], female   DOB: 08/08/1945, 75 y.o.   MRN: [id number]    Cardiology Office Note    Date:  10/19/2020   ID:  Traci [name], DOB [birth year], MRN [id number]  PCP:  Hali Marry, MD  Cardiologist:   Sanda Klein, MD   Chief Complaint  Patient presents with  . PAD    History of Present Illness:  Traci [name] is a 75 y.o. female with an extensive history of peripheral arterial disease involving the mesenteric circulation and upper extremities, mild nonobstructive coronary atherosclerosis, hyperlipidemia, hypertension.  Roughly a month ago she was hospitalized in [place] with a consultation of unexplained complaints.  She presented with headache and nausea and was found to have severely elevated blood pressure per her report as high as a systolic of 628 mmHg.  While there she was discovered to be hypoxemic with an oxygen saturation down to 84%.  Several hours later she developed low-grade fever about 101 F and shaking chills.  She was tested for COVID and influenza, both test negative.  She received empirical antibiotics.  CT and MRI of the head were unremarkable.  She had an echocardiogram which showed normal LVEF and no regional wall motion abnormalities and no new valvular problems.  She had a carotid ultrasound did not show significant obstruction.  Her symptoms resolved within about 24 hours and she was discharged.    Since then she is made a trip to [place], walking long distances in the city without any complaints of dyspnea or chest discomfort.  She did not have any claudication during that visit, but when she tries to walk on her "walking track" in her neighborhood she develops what sounds like claudication in the left calf after roughly 1/4 mile to a half a mile.  This resolves promptly with rest and predictably recurs every time she starts back up.  She has noticed a slight increase in her ankle swelling, but only  takes furosemide 20 mg every other day.  Her blood pressure today was normal and there was no difference between the right and left upper extremities.  Today she did not mention her previous problems with epigastric discomfort or pain in her spine.  Ultrasound performed 05/06/2020 showed a widely patent superior mesenteric artery bypass graft.  Her most recent carotid ultrasound in our system was performed in July 2020 and showed no evidence of obstruction and a widely patent left common carotid to left subclavian bypass graft.  She had a carotid ultrasound performed during her hospitalization at [facility] in March 2022 that reportedly showed no obstruction.  She has not had recent problems with palpitations, dizziness or syncope.  She reports that she had anaphylaxis in response to contrast administration in the past but that she is also allergic to Solu-Medrol and prednisone which make her "pass out".  Cardiac catheterization performed with exclusively Benadryl as premedication occurred without adverse events.  Traci [name] has long-standing history of peripheral arterial disease with previous stents and surgical revascularization of the left subclavian artery for subclavian steal syndrome and presyncope. She eventually underwent a left carotid to subclavian bypass.  She underwent a bypass from the aorta to the chronically totally occluded superficial mesenteric artery due to symptoms of chronic mesenteric ischemia, (Dr. Scot Dock August 2015 6 mm Dacron graft) She had moderate CAD by coronary angiography performed in 2009. Normal left ventricular systolic function and no perfusion abnormalities by echo 2015 and nuclear stress test performed in 2014  and  October 2017. She had carotid duplex ultrasonography last performed in March 2019, without evidence of significant obstruction. She has a left carotid-subclavian bypass graft without evidence of stenosis. The aortic SMA bypass  was noted on abdominal CT performed in July 2018, although no comment was made about its patency. There was no evidence of aortic aneurysm and no comment made about the renal arteries.   Past Medical History:  Diagnosis Date  . Anxiety   . Atrial fibrillation ([place])   . CAD (coronary artery disease)   . Claudication ([place]) 01/06/2008   Lower extremity dopplers - no evidence of arterial insufficiency, normal exam  . Coronary artery disease   . GERD (gastroesophageal reflux disease)   . Hyperlipidemia   . Hypertension 11/30/2010   echo- EF 55%; normal w/ mildly sclerotic aortic valve  . Hypertension 11/05/11   renal dopplers - celiac artery and SMA >50% diameter reduction, R renal artery - mildly elevated velocities 1-59% diameter reduction, L renal artery normal  . Nonspecific ST-T wave electrocardiographic changes 03/26/2011   R/Lmv - EF 74%, normal perfusion all regions, ST depression w/ Lexiscan infusion w/o assoc angina  . Osteopenia   . PAD (peripheral artery disease) ([place])   . Peripheral vascular disease ([place])   . PVD (peripheral vascular disease) ([place]) 11/05/2011   doppler - R/L brachial pressures essentially equal w/o inflow disease; L sublclavian/CCA bypass graft demonstrates patent flow, no evidence of significant stenosis  . Sigmoid diverticulitis     Past Surgical History:  Procedure Laterality Date  . ABDOMINAL AORTIC ANEURYSM REPAIR N/A 01/27/2014   Procedure: AORTO-SUPERIOR MESENTERIC ARTERY BYPASS GRAFT;  Surgeon: Angelia Mould, MD;  Location: [place];  Service: Vascular;  Laterality: N/A;  . ABDOMINAL HYSTERECTOMY    . APPENDECTOMY    . CARDIAC CATHETERIZATION  06/03/2008   60% LAD, involving D2, borderline significant by IVUS, medical therapy. CFX, RCA OK.  Marland Kitchen CATARACT EXTRACTION    . CHOLECYSTECTOMY    . EYE SURGERY     retinal surgery  . SPINE SURGERY  Feb. 2011  . [place]   X's  several  . SUBCLAVIAN ARTERY STENT Left 09/20/2008   LSA  ISR 7x65mm Cordis Genesis on Opta premount, reduction from 80% to 0%  . subclavian artery stents  01/23/2010   Left carotid to subclavian artery Bypass  . VISCERAL ANGIOGRAM  01/17/2014   Procedure: VISCERAL ANGIOGRAM;  Surgeon: Angelia Mould, MD;  Location: [facility] CATH LAB;  Service: Cardiovascular;;    Outpatient Medications Prior to Visit  Medication Sig Dispense Refill  . aspirin EC 81 MG tablet Take 81 mg by mouth daily.    Marland Kitchen atenolol (TENORMIN) 25 MG tablet TAKE 1 TABLET DAILY 90 tablet 3  . atorvastatin (LIPITOR) 10 MG tablet TAKE 1 TABLET DAILY AT 6 P.M. 90 tablet 3  . candesartan (ATACAND) 16 MG tablet TAKE ONE-HALF (1/2) TABLET DAILY 45 tablet 3  . cholecalciferol (VITAMIN D) 1000 UNITS tablet Take 1,000 Units by mouth daily.    . clopidogrel (PLAVIX) 75 MG tablet TAKE 1 TABLET DAILY 90 tablet 3  . clorazepate (TRANXENE) 7.5 MG tablet TAKE 1 TABLET DAILY AS NEEDED 90 tablet 0  . cyanocobalamin 1000 MCG tablet Take 100 mcg by mouth daily.    Marland Kitchen EPINEPHrine (EPIPEN 2-PAK) 0.3 mg/0.3 mL IJ SOAJ injection Inject 0.3 mLs (0.3 mg total) into the muscle as needed (for allergic reaction). 2 Device 0  . esomeprazole (NEXIUM) 40 MG capsule  Take 1 capsule (40 mg total) by mouth daily. Take 1 tab daily (Patient taking differently: Take 40 mg by mouth daily.) 90 capsule 0  . estradiol (ESTRACE) 0.5 MG tablet TAKE 1 TABLET DAILY 90 tablet 1  . furosemide (LASIX) 20 MG tablet TAKE 1 TABLET EVERY OTHER DAY 45 tablet 3  . linaclotide (LINZESS) 145 MCG CAPS capsule Take 1 capsule by mouth daily as needed (constipation).     . meclizine (ANTIVERT) 25 MG tablet TAKE ONE-HALF (1/2) TABLET THREE TIMES A DAY AS NEEDED FOR DIZZINESS OR NAUSEA 60 tablet 8  . Multiple Vitamin (MULTIVITAMIN WITH MINERALS) TABS Take 1 tablet by mouth daily.    Marland Kitchen zolpidem (AMBIEN) 5 MG tablet Take 1 tablet (5 mg total) by mouth at bedtime as needed for sleep. 90 tablet 1  . clorazepate (TRANXENE) 7.5 MG tablet Take 1 tablet  (7.5 mg total) by mouth daily as needed. 90 tablet 0   No facility-administered medications prior to visit.     Allergies:   Cortisone, Dilaudid [hydromorphone hcl], Iodine, Medrol [methylprednisolone], Omnipaque [iohexol], Prednisone, Shellfish allergy, Sulfa drugs cross reactors, Z-pak [azithromycin], Doxycycline, Ivp dye [iodinated diagnostic agents], Methylprednisolone sodium succ, Sulfa antibiotics, Augmentin [amoxicillin-pot clavulanate], Codeine, Erythromycin, and Morphine and related   Social History   Socioeconomic History  . Marital status: Widowed    Spouse name: Not on file  . Number of children: 1  . Years of education: 61  . Highest education level: Bachelor's degree (e.g., BA, AB, BS)  Occupational History  . Occupation: retired    Comment: Marine scientist  Tobacco Use  . Smoking status: Former Smoker    Packs/day: 0.25    Years: 20.00    Pack years: 5.00    Types: Cigarettes    Quit date: 09/30/1993    Years since quitting: 27.0  . Smokeless tobacco: Never Used  Vaping Use  . Vaping Use: Never used  Substance and Sexual Activity  . Alcohol use: No  . Drug use: No  . Sexual activity: Not Currently  Other Topics Concern  . 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 No caffeine use. Likes to go dancing once a week. Likes play online games such as scrabble.    Social Determinants of Health   Financial Resource Strain: Low Risk   . Difficulty of Paying Living Expenses: Not hard at all  Food Insecurity: No Food Insecurity  . Worried About Charity fundraiser in the Last Year: Never true  . Ran Out of Food in the Last Year: Never true  Transportation Needs: No Transportation Needs  . Lack of Transportation (Medical): No  . Lack of Transportation (Non-Medical): No  Physical Activity: Inactive  . Days of Exercise per Week: 0 days  . Minutes of Exercise per Session: 0 min  Stress: No Stress Concern Present  . Feeling  of Stress : Not at all  Social Connections: Moderately Isolated  . Frequency of Communication with Friends and Family: More than three times a week  . Frequency of Social Gatherings with Friends and Family: More than three times a week  . Attends Religious Services: Never  . Active Member of Clubs or Organizations: Yes  . Attends Archivist Meetings: More than 4 times per year  . Marital Status: Widowed     Family History:  The patient's family history includes Diabetes in her maternal grandmother; Heart attack in her father; Heart disease in  her paternal grandfather; Heart disease (age of onset: 67) in her father; Hyperlipidemia in her father; Hypertension in her father; Kidney disease in her father; Kidney failure in her mother.   ROS:   Please see the history of present illness.    ROS All other systems are reviewed and are negative.   PHYSICAL EXAM:   VS:  BP 130/72   Pulse 63   Ht 5\' 4"  (1.626 m)   Wt 153 lb (69.4 kg)   SpO2 97%   BMI 26.26 kg/m   Blood pressure right arm 130/72, left arm 134/74.    General: Alert, oriented x3, no distress Head: no evidence of trauma, PERRL, EOMI, no exophtalmos or lid lag, no myxedema, no xanthelasma; normal ears, nose and oropharynx Neck: normal jugular venous pulsations and no hepatojugular reflux; brisk carotid pulses without delay.  Has bilateral carotid bruits and scar of previous carotid subclavian bypass. Chest: clear to auscultation, no signs of consolidation by percussion or palpation, normal fremitus, symmetrical and full respiratory excursions Cardiovascular: normal position and quality of the apical impulse, regular rhythm, normal first and second heart sounds, early peaking 2/6 systolic ejection murmur in the aortic focus, no diastolic murmurs, rubs or gallops Abdomen: no tenderness or distention, no masses by palpation, no abnormal pulsatility or arterial bruits, normal bowel sounds, no hepatosplenomegaly Extremities: no  clubbing, cyanosis; trivial symmetrical ankle edema; 2+ radial, ulnar and brachial pulses bilaterally; thready pedal pulses bilaterally, symmetrical. Neurological: grossly nonfocal Psych: Normal mood and affect    Wt Readings from Last 3 Encounters:  10/19/20 153 lb (69.4 kg)  09/28/20 156 lb (70.8 kg)  08/28/20 157 lb (71.2 kg)    Studies/Labs Reviewed:   EKG:  EKG is not ordered today.  ECG from 04/26/2020 shows NSR, normal tracing  Duplex carotid ultrasound December 16, 2018: Summary: Right Carotid: Velocities in the right ICA are consistent with a 1-39% stenosis.  Left Carotid: Velocities in the left ICA are consistent with a 1-39% stenosis.               Widely patent left common carotid to subclavian artery bypass               graft.  Vertebrals:  Bilateral vertebral arteries demonstrate antegrade flow. Subclavians: Normal flow hemodynamics were seen in bilateral subclavian              arteries.   Lexiscan Myoview 01/13/2019:  The left ventricular ejection fraction is hyperdynamic (>65%).  Nuclear stress EF: 71%.  There was no ST segment deviation noted during stress.  T wave inversion was noted during stress in the II, III, aVF, V3, V4, V5 and V6 leads, beginning at 30 seconds of stress, and returning to baseline after less than 1 min of recovery.  This is a low risk study.   No ischemia or infarction noted.  There is significant tracer uptake in the bowel which may impact the interpretation of the inferior wall which appears to have normal perfusion. Wall motion is normal, therefore perfusion is likely normal.   Consider performing future studies on the D-Spect (supine and upright) camera.    Echo [facility] 09/13/2020 LeftVentricle: Systolic function is normal. EF: 60-65%.  . LeftAtrium: Injection of agitated saline documents no interatrial  shunt. .  .  Left Ventricle  Normal left ventricular size. Wall thickness is normal. Systolic function is  normal. EF: 60-65%. Wall motion is normal. Doppler parameters indicate normal diastolic function.   Right Ventricle  Right ventricle is normal. Systolic function is normal.   Left Atrium  Left atrium is normal in size. Injection of agitated saline documents no interatrial shunt.   Right Atrium  Normal sized right atrium.   IVC/SVC  The inferior vena cava demonstrates a diameter of <=2.1 cm and collapses >50%; therefore, the right atrial pressure is estimated at 3 mmHg.   Mitral Valve  Mitral valve structure is normal. There is trace regurgitation.   Tricuspid Valve  Tricuspid valve structure is normal. There is trace regurgitation. Unable to assess RVSP due to incomplete Doppler signal.   Aortic Valve  The aortic valve is tricuspid. The leaflets are not thickened and exhibit normal excursion. There is no regurgitation or stenosis.   Pulmonic Valve  The pulmonic valve was not well visualized. Trace regurgitation.   Ascending Aorta  The aortic root is normal in size.   Pericardium  There is no pericardial effusion.   Study Details  A complete echo was performed using complete 2D, color flow Doppler and spectral Doppler. During the study the apical, parasternal and subcostal view was captured. Saline (bubble) contrast was injected during the study. Overall the study quality was fair.  Carotid ultrasound [facility] 09/12/2020  IMPRESSION:  1. No hemodynamically significant stenosis on either side.   2. Both vertebral arteries are patent with antegrade flow.  3. Visualized bilateral subclavian arteries appear patent.   Degree of stenosis is determined using NASCET measurement technique:  Severe: 70-90%  Moderate: 50-69%  Mild: Less than 50%    Abdominal aortic ultrasound [facility] 09/12/2020 IMPRESSION:  Visualization of superior mesenteric artery is limited, however the proximal aspect is appears to be patent. It is unclear if the entire graft is visualized.  A CT scan of the abdomen and pelvis with intravenous contrast would be helpful in further evaluation.   Vascular:  - Proximal abdominal aorta measures: 2.1 cm AP x 2.2 cm in width.  - Mid abdominal aorta measures 1.4 cm AP x 1.5 cm in width.   - Distal abdominal aorta measures 1.2 cm AP x 1.2 cm in width.   Blood flow is documented in the normal direction in the abdominal aorta.   - Right common iliac artery measures 0.7 cm AP.  - Left common iliac artery measures 0.7 cm AP.   - Proximal aspect of the superior mesenteric artery was identified and is patent. The remainder of the SMA was not visualized due to bowel gas.   Miscellaneous:  - Left kidneys are normal in size, with the right kidney measuring up to 9.2 cm and the left measuring up to 9.7 cm.   Recent Labs: 02/29/2020: ALT 13 05/22/2020: BUN 12; Creat 0.82; Hemoglobin 12.2; Platelets 291; Potassium 4.5; Sodium 141   Lipid Panel    Component Value Date/Time   CHOL 122 02/29/2020 0918   CHOL 129 02/17/2019 1234   TRIG 145 02/29/2020 0918   HDL 47 (L) 02/29/2020 0918   HDL 47 02/17/2019 1234   CHOLHDL 2.6 02/29/2020 0918   VLDL 26 02/07/2016 0822   LDLCALC 52 02/29/2020 0918    ASSESSMENT:    1. 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 Left subclavian artery occlusion   4. Pure hypercholesterolemia   5. Atherosclerosis of aorta ([place])   6. Claudication in peripheral vascular disease ([place])   7. Essential hypertension   8. Palpitations  PLAN:  In order of problems listed above:  1. CAD: Although the records from [place] report that she had a complaint of chest pain, she denies this.  Has a known moderate stenosis in the LAD artery by previous coronary angiography.  No evidence of ischemia by myocardial perfusion study January 05, 2019 or by the nuclear stress test performed in March 2022 in  [place]. 2. SMA stenosis: SMA bypass is widely patent by ultrasound last November. 3. L SCL artery occlusion: She does not have symptoms of upper extremity claudication and today there is no difference in the blood pressure between her right and left upper arm. 4. HLP: Last September her LDL cholesterol was 52, labs in care everywhere from March 2022 show LDL of 60.  Continue statin. 5. Ao atherosclerosis: Well documented on multiple radiological studies and associated with extensive PAD.  Increases the likelihood of renal artery stenosis and secondary hypertension. 6. PAD: Seems to have developed left calf claudication, not yet lifestyle limiting.  It would not be a surprise to find significant obstruction or lower extremity arterial system, since she has had subclavian and superior mesenteric stenoses.  Check lower extremity arterial Doppler. 7. HTN: Well-controlled.  Episodic hypotension during her recent hospitalization was likely related to an acute intercurrent infectious illness.  Continue the same medications. 8. Palpitations: Not bothering her a lot right now.  Medication Adjustments/Labs and Tests Ordered: Current medicines are reviewed at length with the patient today.  Concerns regarding medicines are outlined above.  Medication changes, Labs and Tests ordered today are listed in the Patient Instructions below. Patient Instructions  Medication Instructions:  Your physician recommends that you continue on your current medications as directed. Please refer to the Current Medication list given to you today.  *If you need a refill on your cardiac medications before your next appointment, please call your pharmacy*   Lab Work: None ordered.   Testing/Procedures: Your physician has requested that you have a lower or upper extremity arterial duplex. This test is an ultrasound of the arteries in the legs or arms. It looks at arterial blood flow in the legs and arms. Allow one hour for  Lower and Upper Arterial scans. There are no restrictions or special instructions    Follow-Up: At [facility], you and your health needs are our priority.  As part of our continuing mission to provide you with exceptional heart care, we have created designated Provider Care Teams.  These Care Teams include your primary Cardiologist (physician) and Advanced Practice Providers (APPs -  Physician Assistants and Nurse Practitioners) who all work together to provide you with the care you need, when you need it.  We recommend signing up for the patient portal called "MyChart".  Sign up information is provided on this After Visit Summary.  MyChart is used to connect with patients for Virtual Visits (Telemedicine).  Patients are able to view lab/test results, encounter notes, upcoming appointments, etc.  Non-urgent messages can be sent to your provider as well.   To learn more about what you can do with MyChart, go to [email or web address].    Your next appointment:   12 month(s)  The format for your next appointment:   In Person  Provider:   Sanda Klein, MD         Signed, Sanda Klein, MD  10/19/2020 1:17 PM    [address], [place], [place]  [address] Phone: [phone]; Fax: [phone]

## 2020-10-19 NOTE — Patient Instructions (Signed)
Medication Instructions:  Your physician recommends that you continue on your current medications as directed. Please refer to the Current Medication list given to you today.  *If you need a refill on your cardiac medications before your next appointment, please call your pharmacy*   Lab Work: None ordered.   Testing/Procedures: Your physician has requested that you have a lower or upper extremity arterial duplex. This test is an ultrasound of the arteries in the legs or arms. It looks at arterial blood flow in the legs and arms. Allow one hour for Lower and Upper Arterial scans. There are no restrictions or special instructions    Follow-Up: At [facility], you and your health needs are our priority.  As part of our continuing mission to provide you with exceptional heart care, we have created designated Provider Care Teams.  These Care Teams include your primary Cardiologist (physician) and Advanced Practice Providers (APPs -  Physician Assistants and Nurse Practitioners) who all work together to provide you with the care you need, when you need it.  We recommend signing up for the patient portal called "MyChart".  Sign up information is provided on this After Visit Summary.  MyChart is used to connect with patients for Virtual Visits (Telemedicine).  Patients are able to view lab/test results, encounter notes, upcoming appointments, etc.  Non-urgent messages can be sent to your provider as well.   To learn more about what you can do with MyChart, go to [email or web address].    Your next appointment:   12 month(s)  The format for your next appointment:   In Person  Provider:   Sanda Klein, MD

## 2020-10-23 ENCOUNTER — Other Ambulatory Visit: Payer: Self-pay | Admitting: Family Medicine

## 2020-10-23 ENCOUNTER — Encounter: Payer: Self-pay | Admitting: Family Medicine

## 2020-10-24 ENCOUNTER — Other Ambulatory Visit: Payer: Self-pay | Admitting: Cardiovascular Disease

## 2020-10-24 DIAGNOSIS — I739 Peripheral vascular disease, unspecified: Secondary | ICD-10-CM

## 2020-10-24 MED ORDER — ESTRADIOL 0.5 MG PO TABS: 0.5000 mg | ORAL_TABLET | Freq: Every day | ORAL | 1 refills | Status: DC

## 2020-10-24 NOTE — Telephone Encounter (Signed)
Med sent. Not sure why denied

## 2020-11-06 ENCOUNTER — Ambulatory Visit (HOSPITAL_COMMUNITY)
Admission: RE | Admit: 2020-11-06 | Discharge: 2020-11-06 | Disposition: A | Discharge status: Home / Self Care | Payer: Medicare Other | Source: Ambulatory Visit | Attending: Cardiology | Admitting: Cardiology

## 2020-11-06 ENCOUNTER — Other Ambulatory Visit: Payer: Self-pay

## 2020-11-06 DIAGNOSIS — I739 Peripheral vascular disease, unspecified: Secondary | ICD-10-CM | POA: Diagnosis not present

## 2020-11-21 ENCOUNTER — Other Ambulatory Visit: Payer: Self-pay | Admitting: Family Medicine

## 2020-11-21 DIAGNOSIS — Z1231 Encounter for screening mammogram for malignant neoplasm of breast: Secondary | ICD-10-CM

## 2020-11-23 ENCOUNTER — Ambulatory Visit (INDEPENDENT_AMBULATORY_CARE_PROVIDER_SITE_OTHER): Payer: Medicare Other

## 2020-11-23 ENCOUNTER — Encounter: Payer: Self-pay | Admitting: Family Medicine

## 2020-11-23 ENCOUNTER — Other Ambulatory Visit: Payer: Self-pay | Admitting: Family Medicine

## 2020-11-23 ENCOUNTER — Ambulatory Visit (INDEPENDENT_AMBULATORY_CARE_PROVIDER_SITE_OTHER): Payer: Medicare Other | Admitting: Family Medicine

## 2020-11-23 ENCOUNTER — Other Ambulatory Visit: Payer: Self-pay

## 2020-11-23 VITALS — BP 146/75 | HR 61 | Resp 17

## 2020-11-23 DIAGNOSIS — K3189 Other diseases of stomach and duodenum: Secondary | ICD-10-CM | POA: Diagnosis not present

## 2020-11-23 DIAGNOSIS — I708 Atherosclerosis of other arteries: Secondary | ICD-10-CM

## 2020-11-23 DIAGNOSIS — R1032 Left lower quadrant pain: Secondary | ICD-10-CM

## 2020-11-23 DIAGNOSIS — K449 Diaphragmatic hernia without obstruction or gangrene: Secondary | ICD-10-CM | POA: Diagnosis not present

## 2020-11-23 DIAGNOSIS — K5732 Diverticulitis of large intestine without perforation or abscess without bleeding: Secondary | ICD-10-CM | POA: Diagnosis not present

## 2020-11-23 DIAGNOSIS — K5792 Diverticulitis of intestine, part unspecified, without perforation or abscess without bleeding: Secondary | ICD-10-CM

## 2020-11-23 DIAGNOSIS — R300 Dysuria: Secondary | ICD-10-CM | POA: Diagnosis not present

## 2020-11-23 DIAGNOSIS — K6389 Other specified diseases of intestine: Secondary | ICD-10-CM | POA: Diagnosis not present

## 2020-11-23 LAB — POCT URINALYSIS DIP (CLINITEK)
Bilirubin, UA: NEGATIVE
Blood, UA: NEGATIVE
Glucose, UA: NEGATIVE mg/dL
Ketones, POC UA: NEGATIVE mg/dL
Nitrite, UA: NEGATIVE
POC PROTEIN,UA: NEGATIVE
Spec Grav, UA: 1.015 (ref 1.010–1.025)
Urobilinogen, UA: 0.2 E.U./dL
pH, UA: 7 (ref 5.0–8.0)

## 2020-11-23 MED ORDER — METRONIDAZOLE 500 MG PO TABS: 500.0000 mg | ORAL_TABLET | Freq: Three times a day (TID) | ORAL | 0 refills | Status: AC

## 2020-11-23 MED ORDER — CIPROFLOXACIN HCL 500 MG PO TABS: 500.0000 mg | ORAL_TABLET | Freq: Two times a day (BID) | ORAL | 0 refills | Status: AC

## 2020-11-23 NOTE — Progress Notes (Signed)
MyChart message sent - Traci [name], your CT scan showed diverticulitis. I am sending in antibiotics. Keep with a liquid/soft, bland diet for a couple of days and then gradually progress as tolerated. I hope you feel better soon!

## 2020-11-23 NOTE — Progress Notes (Signed)
Acute Office Visit  Subjective:    Patient ID: Traci [name], female    DOB: February 25, 1946, 75 y.o.   MRN: [id number]  Chief Complaint  Patient presents with   Urinary Tract Infection   LLQ pain    HPI Patient is in today for LLQ pain.  Yesterday woke up "feeling like my guts were falling out" - lower abdominal cramping. Hasn't been able to eat since day before yesterday other than toast, saltine crackers and water. Cramping sensation. She took a Linzess yesterday morning because it had been about 3 days since last BM (baseline for her) and wasn't sure if that may have been causing the discomfort - successful BM.  Pain persisted and continued to worsen. At rest LLQ pain is 5/10 cramping sensation with occasional sharp pains 10/10 that last for a few seconds before settling down. She feels like she is having trouble passing gas since yesterday and feels more bloated/distended than usual. She reports the pain is significantly worse with palpation or pressure associated with bending over or getting left leg into pants while getting dressed. No blood in urine/stool.  No changes in urinary frequency/urgency, but has noticed some mild dysuria today. She describes the pain as similar intensity to previous diverticulitis flare but lower in abdomen than she remembers. She has been nauseous but has not vomited; no fevers. She does feel fatigued.     Past Medical History:  Diagnosis Date   Anxiety    Atrial fibrillation ([place])    CAD (coronary artery disease)    Claudication ([place]) 01/06/2008   Lower extremity dopplers - no evidence of arterial insufficiency, normal exam   Coronary artery disease    GERD (gastroesophageal reflux disease)    Hyperlipidemia    Hypertension 11/30/2010   echo- EF 55%; normal w/ mildly sclerotic aortic valve   Hypertension 11/05/11   renal dopplers - celiac artery and SMA >50% diameter reduction, R renal artery - mildly elevated velocities 1-59% diameter  reduction, L renal artery normal   Nonspecific ST-T wave electrocardiographic changes 03/26/2011   R/Lmv - EF 74%, normal perfusion all regions, ST depression w/ Lexiscan infusion w/o assoc angina   Osteopenia    PAD (peripheral artery disease) (HCC)    Peripheral vascular disease (HCC)    PVD (peripheral vascular disease) ([place]) 11/05/2011   doppler - R/L brachial pressures essentially equal w/o inflow disease; L sublclavian/CCA bypass graft demonstrates patent flow, no evidence of significant stenosis   Sigmoid diverticulitis     Past Surgical History:  Procedure Laterality Date   ABDOMINAL AORTIC ANEURYSM REPAIR N/A 01/27/2014   Procedure: AORTO-SUPERIOR MESENTERIC ARTERY BYPASS GRAFT;  Surgeon: Angelia Mould, MD;  Location: [place] [facility];  Service: Vascular;  Laterality: N/A;   ABDOMINAL HYSTERECTOMY     APPENDECTOMY     CARDIAC CATHETERIZATION  06/03/2008   60% LAD, involving D2, borderline significant by IVUS, medical therapy. CFX, RCA OK.   CATARACT EXTRACTION     CHOLECYSTECTOMY     EYE SURGERY     retinal surgery   SPINE SURGERY  Feb. 2011   [place]   X's  several   SUBCLAVIAN ARTERY STENT Left 09/20/2008   LSA ISR 7x1mm Cordis Genesis on Opta premount, reduction from 80% to 0%   subclavian artery stents  01/23/2010   Left carotid to subclavian artery Bypass   VISCERAL ANGIOGRAM  01/17/2014   Procedure: VISCERAL ANGIOGRAM;  Surgeon: Angelia Mould, MD;  Location: [facility] CATH  LAB;  Service: Cardiovascular;;    Family History  Problem Relation Age of Onset   Heart disease Father 68       Heart Disease before age 41   Kidney disease Father    Heart attack Father    Hyperlipidemia Father    Hypertension Father    Kidney failure Mother    Diabetes Maternal Grandmother    Heart disease Paternal Grandfather     Social History   Socioeconomic History   Marital status: Widowed    Spouse name: Not on file   Number of children: 1   Years of  education: 16   Highest education level: Bachelor's degree (e.g., BA, AB, BS)  Occupational History   Occupation: retired    Comment: Nurse  Tobacco Use   Smoking status: Former    Packs/day: 0.25    Years: 20.00    Pack years: 5.00    Types: Cigarettes    Quit date: 09/30/1993    Years since quitting: 27.1   Smokeless tobacco: Never  Vaping Use   Vaping Use: Never used  Substance and Sexual Activity   Alcohol use: No   Drug use: No   Sexual activity: Not Currently  Other Topics Concern   Not on file  Social History Narrative   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 No caffeine use. Likes to go dancing once a week. Likes play online games such as scrabble.    Social Determinants of Health   Financial Resource Strain: Low Risk    Difficulty of Paying Living Expenses: Not hard at all  Food Insecurity: No Food Insecurity   Worried About Charity fundraiser in the Last Year: Never true   [place] in the Last Year: Never true  Transportation Needs: No Transportation Needs   Lack of Transportation (Medical): No   Lack of Transportation (Non-Medical): No  Physical Activity: Inactive   Days of Exercise per Week: 0 days   Minutes of Exercise per Session: 0 min  Stress: No Stress Concern Present   Feeling of Stress : Not at all  Social Connections: Moderately Isolated   Frequency of Communication with Friends and Family: More than three times a week   Frequency of Social Gatherings with Friends and Family: More than three times a week   Attends Religious Services: Never   Marine scientist or Organizations: Yes   Attends Music therapist: More than 4 times per year   Marital Status: Widowed  Human resources officer Violence: Not At Risk   Fear of Current or Ex-Partner: No   Emotionally Abused: No   Physically Abused: No   Sexually Abused: No    Outpatient Medications Prior to Visit  Medication Sig Dispense Refill   aspirin EC 81 MG tablet  Take 81 mg by mouth daily.     atenolol (TENORMIN) 25 MG tablet TAKE 1 TABLET DAILY 90 tablet 3   atorvastatin (LIPITOR) 10 MG tablet TAKE 1 TABLET DAILY AT 6 P.M. 90 tablet 3   candesartan (ATACAND) 16 MG tablet TAKE ONE-HALF (1/2) TABLET DAILY 45 tablet 3   cholecalciferol (VITAMIN D) 1000 UNITS tablet Take 1,000 Units by mouth daily.     clopidogrel (PLAVIX) 75 MG tablet TAKE 1 TABLET DAILY 90 tablet 3   clorazepate (TRANXENE) 7.5 MG tablet TAKE 1 TABLET DAILY AS NEEDED 90 tablet 0   cyanocobalamin 1000 MCG tablet Take 100 mcg by mouth daily.  EPINEPHrine (EPIPEN 2-PAK) 0.3 mg/0.3 mL IJ SOAJ injection Inject 0.3 mLs (0.3 mg total) into the muscle as needed (for allergic reaction). 2 Device 0   esomeprazole (NEXIUM) 40 MG capsule Take 1 capsule (40 mg total) by mouth daily. Take 1 tab daily (Patient taking differently: Take 40 mg by mouth daily.) 90 capsule 0   estradiol (ESTRACE) 0.5 MG tablet Take 1 tablet (0.5 mg total) by mouth daily. 90 tablet 1   furosemide (LASIX) 20 MG tablet TAKE 1 TABLET EVERY OTHER DAY 45 tablet 3   linaclotide (LINZESS) 145 MCG CAPS capsule Take 1 capsule by mouth daily as needed (constipation).      meclizine (ANTIVERT) 25 MG tablet TAKE ONE-HALF (1/2) TABLET THREE TIMES A DAY AS NEEDED FOR DIZZINESS OR NAUSEA 60 tablet 8   Multiple Vitamin (MULTIVITAMIN WITH MINERALS) TABS Take 1 tablet by mouth daily.     zolpidem (AMBIEN) 5 MG tablet Take 1 tablet (5 mg total) by mouth at bedtime as needed for sleep. 90 tablet 1   No facility-administered medications prior to visit.    Allergies  Allergen Reactions   Cortisone Anaphylaxis   Dilaudid [Hydromorphone Hcl] Nausea And Vomiting    Pt states she will start vomiting immediately for 6 hours   Iodine Anaphylaxis   Medrol [Methylprednisolone] Anaphylaxis   Omnipaque [Iohexol] Anaphylaxis   Prednisone Anaphylaxis and Swelling   Shellfish Allergy Anaphylaxis   Sulfa Drugs Cross Reactors Anaphylaxis   Z-Pak  [Azithromycin] Swelling   Doxycycline     Thrush/itching   Ivp Dye [Iodinated Diagnostic Agents]     Other reaction(s): Unknown   Methylprednisolone Sodium Succ Swelling    Blood pressure drops immediately   Sulfa Antibiotics Other (See Comments)    Stomach upset   Augmentin [Amoxicillin-Pot Clavulanate] Nausea Only    Upset stomach (Has taken cephalexin in the past without problems)   Codeine Rash and Other (See Comments)    Upset stomach   Erythromycin Rash   Morphine And Related Nausea Only    nausea    Review of Systems All review of systems negative except what is listed in the HPI     Objective:    Physical Exam Constitutional:      Appearance: Normal appearance.  Cardiovascular:     Rate and Rhythm: Regular rhythm.     Heart sounds: Normal heart sounds.  Pulmonary:     Breath sounds: Normal breath sounds.  Abdominal:     General: Bowel sounds are normal. There is no distension.     Palpations: Abdomen is soft. There is no mass.     Tenderness: There is abdominal tenderness. There is guarding. There is no rebound.     Comments: LLQ tenderness to palpation  Skin:    Findings: No rash.  Neurological:     Mental Status: She is alert and oriented to person, place, and time.  Psychiatric:        Mood and Affect: Mood normal.        Behavior: Behavior normal.        Thought Content: Thought content normal.        Judgment: Judgment normal.    BP (!) 146/75   Pulse 61   Resp 17   SpO2 98%  Wt Readings from Last 3 Encounters:  10/19/20 153 lb (69.4 kg)  09/28/20 156 lb (70.8 kg)  08/28/20 157 lb (71.2 kg)    Health Maintenance Due  Topic Date Due   Pneumococcal Vaccine 0-64  Years old (1 - PCV) Never done   Zoster Vaccines- Shingrix (1 of 2) Never done   COVID-19 Vaccine (4 - Booster for Pfizer series) 06/14/2020    There are no preventive care reminders to display for this patient.   Lab Results  Component Value Date   TSH 1.38 09/03/2019   Lab  Results  Component Value Date   WBC 4.8 05/22/2020   HGB 12.2 05/22/2020   HCT 36.8 05/22/2020   MCV 89.8 05/22/2020   PLT 291 05/22/2020   Lab Results  Component Value Date   NA 141 05/22/2020   K 4.5 05/22/2020   CO2 29 05/22/2020   GLUCOSE 81 05/22/2020   BUN 12 05/22/2020   CREATININE 0.82 05/22/2020   BILITOT 0.5 02/29/2020   ALKPHOS 58 02/17/2019   AST 20 02/29/2020   ALT 13 02/29/2020   PROT 6.5 02/29/2020   ALBUMIN 4.3 02/17/2019   CALCIUM 8.8 05/22/2020   ANIONGAP 10 09/09/2019   Lab Results  Component Value Date   CHOL 122 02/29/2020   Lab Results  Component Value Date   HDL 47 (L) 02/29/2020   Lab Results  Component Value Date   LDLCALC 52 02/29/2020   Lab Results  Component Value Date   TRIG 145 02/29/2020   Lab Results  Component Value Date   CHOLHDL 2.6 02/29/2020   No results found for: HGBA1C     Assessment & Plan:   1. Dysuria UA with small leuks. Will send for culture and update patient with results and plan.   - POCT URINALYSIS DIP (CLINITEK) - Urine Culture  2. LLQ pain Presentation concerning for diverticulitis. CBC and CT ordered today. Will update patient with results and plan of care. Patient aware of signs/symptoms requiring further/urgent evaluation.   - CBC with Differential - CT Abdomen Pelvis Wo Contrast; Future  Follow-up pending test results.  Terrilyn Saver, NP

## 2020-11-24 LAB — CBC WITH DIFFERENTIAL/PLATELET
Absolute Monocytes: 482 cells/uL (ref 200–950)
Basophils Absolute: 43 cells/uL (ref 0–200)
Basophils Relative: 0.7 %
Eosinophils Absolute: 110 cells/uL (ref 15–500)
Eosinophils Relative: 1.8 %
HCT: 36.4 % (ref 35.0–45.0)
Hemoglobin: 12.4 g/dL (ref 11.7–15.5)
Lymphs Abs: 1702 cells/uL (ref 850–3900)
MCH: 30.5 pg (ref 27.0–33.0)
MCHC: 34.1 g/dL (ref 32.0–36.0)
MCV: 89.7 fL (ref 80.0–100.0)
MPV: 9.1 fL (ref 7.5–12.5)
Monocytes Relative: 7.9 %
Neutro Abs: 3764 cells/uL (ref 1500–7800)
Neutrophils Relative %: 61.7 %
Platelets: 326 10*3/uL (ref 140–400)
RBC: 4.06 10*6/uL (ref 3.80–5.10)
RDW: 11.8 % (ref 11.0–15.0)
Total Lymphocyte: 27.9 %
WBC: 6.1 10*3/uL (ref 3.8–10.8)

## 2020-11-24 LAB — HOUSE ACCOUNT TRACKING

## 2020-11-24 NOTE — Progress Notes (Signed)
Blood work look good!

## 2020-12-27 ENCOUNTER — Encounter: Payer: Self-pay | Admitting: Family Medicine

## 2020-12-29 NOTE — Telephone Encounter (Signed)
She has to have 2 Shingrix, 2-6 mo apart. I don't think we have it documented for her to we may ned to call her pharmacy to confirm.

## 2020-12-30 DIAGNOSIS — Z23 Encounter for immunization: Secondary | ICD-10-CM | POA: Diagnosis not present

## 2020-12-31 ENCOUNTER — Other Ambulatory Visit: Payer: Self-pay | Admitting: Family Medicine

## 2021-01-05 ENCOUNTER — Other Ambulatory Visit: Payer: Self-pay | Admitting: *Deleted

## 2021-01-05 DIAGNOSIS — G4700 Insomnia, unspecified: Secondary | ICD-10-CM

## 2021-01-05 MED ORDER — ZOLPIDEM TARTRATE 5 MG PO TABS: 5.0000 mg | ORAL_TABLET | Freq: Every evening | ORAL | 1 refills | Status: DC | PRN

## 2021-01-08 ENCOUNTER — Encounter: Payer: Self-pay | Admitting: Family Medicine

## 2021-01-08 ENCOUNTER — Other Ambulatory Visit: Payer: Self-pay

## 2021-01-08 ENCOUNTER — Ambulatory Visit (INDEPENDENT_AMBULATORY_CARE_PROVIDER_SITE_OTHER): Payer: Medicare Other | Admitting: Family Medicine

## 2021-01-08 VITALS — BP 139/57 | HR 60 | Ht 64.0 in | Wt 156.1 lb

## 2021-01-08 DIAGNOSIS — B3789 Other sites of candidiasis: Secondary | ICD-10-CM

## 2021-01-08 DIAGNOSIS — I708 Atherosclerosis of other arteries: Secondary | ICD-10-CM

## 2021-01-08 MED ORDER — NYSTATIN 100000 UNIT/GM EX POWD: 1.0000 "application " | Freq: Two times a day (BID) | CUTANEOUS | 1 refills | Status: DC

## 2021-01-08 NOTE — Progress Notes (Signed)
Acute Office Visit  Subjective:    Patient ID: Traci [name], female    DOB: June 21, 1945, 75 y.o.   MRN: [id number]  Chief Complaint  Patient presents with   Rash    Red spots under right breast, fist noticed 3 days, OTC creams applied    HPI Patient is in today for Rash, under the right breast.  She noticed it about 5 days ago she said the redness was actually spreading down into the abdomen and it was oozing and had an odor to it.  She started putting some triple antibiotic on it over the weekend and says she feels like it actually has helped some some of the redness has receded but it is not completely gone.  She received her booster COVID vaccine. She had a fever to 103, chills and HA for about 1.5 days after the vaccine burt she is feeling better.    Past Medical History:  Diagnosis Date   Anxiety    Atrial fibrillation ([place])    CAD (coronary artery disease)    Claudication ([place]) 01/06/2008   Lower extremity dopplers - no evidence of arterial insufficiency, normal exam   Coronary artery disease    GERD (gastroesophageal reflux disease)    Hyperlipidemia    Hypertension 11/30/2010   echo- EF 55%; normal w/ mildly sclerotic aortic valve   Hypertension 11/05/11   renal dopplers - celiac artery and SMA >50% diameter reduction, R renal artery - mildly elevated velocities 1-59% diameter reduction, L renal artery normal   Nonspecific ST-T wave electrocardiographic changes 03/26/2011   R/Lmv - EF 74%, normal perfusion all regions, ST depression w/ Lexiscan infusion w/o assoc angina   Osteopenia    PAD (peripheral artery disease) (HCC)    Peripheral vascular disease (HCC)    PVD (peripheral vascular disease) ([place]) 11/05/2011   doppler - R/L brachial pressures essentially equal w/o inflow disease; L sublclavian/CCA bypass graft demonstrates patent flow, no evidence of significant stenosis   Sigmoid diverticulitis     Past Surgical History:  Procedure Laterality Date    ABDOMINAL AORTIC ANEURYSM REPAIR N/A 01/27/2014   Procedure: AORTO-SUPERIOR MESENTERIC ARTERY BYPASS GRAFT;  Surgeon: Angelia Mould, MD;  Location: [place];  Service: Vascular;  Laterality: N/A;   ABDOMINAL HYSTERECTOMY     APPENDECTOMY     CARDIAC CATHETERIZATION  06/03/2008   60% LAD, involving D2, borderline significant by IVUS, medical therapy. CFX, RCA OK.   CATARACT EXTRACTION     CHOLECYSTECTOMY     EYE SURGERY     retinal surgery   SPINE SURGERY  Feb. 2011   [place]   X's  several   SUBCLAVIAN ARTERY STENT Left 09/20/2008   LSA ISR 7x12m Cordis Genesis on Opta premount, reduction from 80% to 0%   subclavian artery stents  01/23/2010   Left carotid to subclavian artery Bypass   VISCERAL ANGIOGRAM  01/17/2014   Procedure: VISCERAL ANGIOGRAM;  Surgeon: CAngelia Mould MD;  [facility];  Service: Cardiovascular;;    Family History  Problem Relation Age of Onset   Heart disease Father 673      Heart Disease before age 525  Kidney disease Father    Heart attack Father    Hyperlipidemia Father    Hypertension Father    Kidney failure Mother    Diabetes Maternal Grandmother    Heart disease Paternal Grandfather     Social History   Socioeconomic History  Marital status: Widowed    Spouse name: Not on file   Number of children: 1   Years of education: 16   Highest education level: Bachelor's degree (e.g., BA, AB, BS)  Occupational History   Occupation: retired    Comment: Nurse  Tobacco Use   Smoking status: Former    Packs/day: 0.25    Years: 20.00    Pack years: 5.00    Types: Cigarettes    Quit date: 09/30/1993    Years since quitting: 27.2   Smokeless tobacco: Never  Vaping Use   Vaping Use: Never used  Substance and Sexual Activity   Alcohol use: No   Drug use: No   Sexual activity: Not Currently  Other Topics Concern   Not on file  Social History Narrative   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 No caffeine use. Likes to go dancing once a week. Likes play online games such as scrabble.    Social Determinants of Health   Financial Resource Strain: Low Risk    Difficulty of Paying Living Expenses: Not hard at all  Food Insecurity: No Food Insecurity   Worried About Charity fundraiser in the Last Year: Never true   [address] in the Last Year: Never true  Transportation Needs: No Transportation Needs   Lack of Transportation (Medical): No   Lack of Transportation (Non-Medical): No  Physical Activity: Inactive   Days of Exercise per Week: 0 days   Minutes of Exercise per Session: 0 min  Stress: No Stress Concern Present   Feeling of Stress : Not at all  Social Connections: Moderately Isolated   Frequency of Communication with Friends and Family: More than three times a week   Frequency of Social Gatherings with Friends and Family: More than three times a week   Attends Religious Services: Never   Marine scientist or Organizations: Yes   Attends Music therapist: More than 4 times per year   Marital Status: Widowed  Human resources officer Violence: Not At Risk   Fear of Current or Ex-Partner: No   Emotionally Abused: No   Physically Abused: No   Sexually Abused: No    Outpatient Medications Prior to Visit  Medication Sig Dispense Refill   aspirin EC 81 MG tablet Take 81 mg by mouth daily.     atenolol (TENORMIN) 25 MG tablet TAKE 1 TABLET DAILY 90 tablet 3   atorvastatin (LIPITOR) 10 MG tablet TAKE 1 TABLET DAILY AT 6 P.M. 90 tablet 3   candesartan (ATACAND) 16 MG tablet TAKE ONE-HALF (1/2) TABLET DAILY 45 tablet 3   cholecalciferol (VITAMIN D) 1000 UNITS tablet Take 1,000 Units by mouth daily.     cloNIDine (CATAPRES) 0.1 MG tablet      clopidogrel (PLAVIX) 75 MG tablet TAKE 1 TABLET DAILY 90 tablet 3   clorazepate (TRANXENE) 7.5 MG tablet TAKE 1 TABLET DAILY AS NEEDED 90 tablet 0   cyanocobalamin 1000 MCG tablet Take 100 mcg by mouth daily.      dicyclomine (BENTYL) 20 MG tablet      EPINEPHrine (EPIPEN 2-PAK) 0.3 mg/0.3 mL IJ SOAJ injection Inject 0.3 mLs (0.3 mg total) into the muscle as needed (for allergic reaction). 2 Device 0   esomeprazole (NEXIUM) 40 MG capsule Take 1 capsule (40 mg total) by mouth daily. Take 1 tab daily (Patient taking differently: Take 40 mg by mouth daily.) 90 capsule 0   estradiol (ESTRACE) 0.5 MG  tablet Take 1 tablet (0.5 mg total) by mouth daily. 90 tablet 1   furosemide (LASIX) 20 MG tablet TAKE 1 TABLET EVERY OTHER DAY 45 tablet 3   gabapentin (NEURONTIN) 300 MG capsule      levofloxacin (LEVAQUIN) 750 MG tablet      linaclotide (LINZESS) 145 MCG CAPS capsule Take 1 capsule by mouth daily as needed (constipation).      meclizine (ANTIVERT) 25 MG tablet TAKE ONE-HALF (1/2) TABLET THREE TIMES A DAY AS NEEDED FOR DIZZINESS OR NAUSEA 60 tablet 8   metaxalone (SKELAXIN) 800 MG tablet      Multiple Vitamin (MULTIVITAMIN WITH MINERALS) TABS Take 1 tablet by mouth daily.     nystatin (MYCOSTATIN) 100000 UNIT/ML suspension      ondansetron (ZOFRAN-ODT) 4 MG disintegrating tablet      pantoprazole (PROTONIX) 40 MG tablet      promethazine (PHENERGAN) 25 MG tablet      sucralfate (CARAFATE) 1 g tablet      traMADol (ULTRAM) 50 MG tablet      zolpidem (AMBIEN) 5 MG tablet Take 1 tablet (5 mg total) by mouth at bedtime as needed for sleep. 90 tablet 1   No facility-administered medications prior to visit.    Allergies  Allergen Reactions   Cortisone Anaphylaxis   Dilaudid [Hydromorphone Hcl] Nausea And Vomiting    Pt states she will start vomiting immediately for 6 hours   Iodine Anaphylaxis   Medrol [Methylprednisolone] Anaphylaxis   Omnipaque [Iohexol] Anaphylaxis   Prednisone Anaphylaxis and Swelling   Shellfish Allergy Anaphylaxis   Sulfa Drugs Cross Reactors Anaphylaxis   Z-Pak [Azithromycin] Swelling   Doxycycline     Thrush/itching   Ivp Dye [Iodinated Diagnostic Agents]     Other  reaction(s): Unknown   Methylprednisolone Sodium Succ Swelling    Blood pressure drops immediately   Sulfa Antibiotics Other (See Comments)    Stomach upset   Augmentin [Amoxicillin-Pot Clavulanate] Nausea Only    Upset stomach (Has taken cephalexin in the past without problems)   Codeine Rash and Other (See Comments)    Upset stomach   Erythromycin Rash   Morphine And Related Nausea Only    nausea    Review of Systems     Objective:    Physical Exam Vitals reviewed.  Constitutional:      Appearance: She is well-developed.  HENT:     Head: Normocephalic and atraumatic.  Eyes:     Conjunctiva/sclera: Conjunctivae normal.  Cardiovascular:     Rate and Rhythm: Normal rate.  Pulmonary:     Effort: Pulmonary effort is normal.  Skin:    General: Skin is dry.     Coloration: Skin is not pale.     Comments: Erythematous rash under the right breast with satellite lesions.   Neurological:     Mental Status: She is alert and oriented to person, place, and time.  Psychiatric:        Behavior: Behavior normal.    BP (!) 139/57   Pulse 60   Ht '5\' 4"'$  (1.626 m)   Wt 156 lb 1.9 oz (70.8 kg)   SpO2 98%   BMI 26.80 kg/m  Wt Readings from Last 3 Encounters:  01/08/21 156 lb 1.9 oz (70.8 kg)  10/19/20 153 lb (69.4 kg)  09/28/20 156 lb (70.8 kg)    Health Maintenance Due  Topic Date Due   Zoster Vaccines- Shingrix (1 of 2) Never done    There are no preventive care  reminders to display for this patient.   Lab Results  Component Value Date   TSH 1.38 09/03/2019   Lab Results  Component Value Date   WBC 6.1 11/23/2020   HGB 12.4 11/23/2020   HCT 36.4 11/23/2020   MCV 89.7 11/23/2020   PLT 326 11/23/2020   Lab Results  Component Value Date   NA 141 05/22/2020   K 4.5 05/22/2020   CO2 29 05/22/2020   GLUCOSE 81 05/22/2020   BUN 12 05/22/2020   CREATININE 0.82 05/22/2020   BILITOT 0.5 02/29/2020   ALKPHOS 58 02/17/2019   AST 20 02/29/2020   ALT 13 02/29/2020    PROT 6.5 02/29/2020   ALBUMIN 4.3 02/17/2019   CALCIUM 8.8 05/22/2020   ANIONGAP 10 09/09/2019   Lab Results  Component Value Date   CHOL 122 02/29/2020   Lab Results  Component Value Date   HDL 47 (L) 02/29/2020   Lab Results  Component Value Date   LDLCALC 52 02/29/2020   Lab Results  Component Value Date   TRIG 145 02/29/2020   Lab Results  Component Value Date   CHOLHDL 2.6 02/29/2020   No results found for: HGBA1C     Assessment & Plan:   Problem List Items Addressed This Visit   None Visit Diagnoses     Candidiasis of breast    -  Primary   Relevant Medications   nystatin (MYCOSTATIN) 100000 UNIT/ML suspension   nystatin (MYCOSTATIN/NYSTOP) powder      Candidiasis of right breast-we will treat with nystatin powder call if not better by the end of the week.  He did have some side effects of the benefit booster recently but is otherwise doing well we did discuss getting her shingles vaccine this fall.  Meds ordered this encounter  Medications   nystatin (MYCOSTATIN/NYSTOP) powder    Sig: Apply 1 application topically 2 (two) times daily. X 10 days    Dispense:  60 g    Refill:  1     Beatrice Lecher, MD

## 2021-01-10 ENCOUNTER — Ambulatory Visit: Payer: Medicare Other

## 2021-01-11 ENCOUNTER — Other Ambulatory Visit: Payer: Self-pay

## 2021-01-11 ENCOUNTER — Ambulatory Visit (INDEPENDENT_AMBULATORY_CARE_PROVIDER_SITE_OTHER): Payer: Medicare Other

## 2021-01-11 DIAGNOSIS — Z1231 Encounter for screening mammogram for malignant neoplasm of breast: Secondary | ICD-10-CM

## 2021-01-19 ENCOUNTER — Other Ambulatory Visit: Payer: Self-pay

## 2021-01-19 DIAGNOSIS — G4700 Insomnia, unspecified: Secondary | ICD-10-CM

## 2021-01-29 ENCOUNTER — Other Ambulatory Visit: Payer: Self-pay | Admitting: Cardiovascular Disease

## 2021-01-31 DIAGNOSIS — Z880 Allergy status to penicillin: Secondary | ICD-10-CM | POA: Diagnosis not present

## 2021-01-31 DIAGNOSIS — R519 Headache, unspecified: Secondary | ICD-10-CM | POA: Diagnosis not present

## 2021-01-31 DIAGNOSIS — Z885 Allergy status to narcotic agent status: Secondary | ICD-10-CM | POA: Diagnosis not present

## 2021-01-31 DIAGNOSIS — Z7902 Long term (current) use of antithrombotics/antiplatelets: Secondary | ICD-10-CM | POA: Diagnosis not present

## 2021-01-31 DIAGNOSIS — Z87892 Personal history of anaphylaxis: Secondary | ICD-10-CM | POA: Diagnosis not present

## 2021-01-31 DIAGNOSIS — Z888 Allergy status to other drugs, medicaments and biological substances status: Secondary | ICD-10-CM | POA: Diagnosis not present

## 2021-01-31 DIAGNOSIS — Z882 Allergy status to sulfonamides status: Secondary | ICD-10-CM | POA: Diagnosis not present

## 2021-01-31 DIAGNOSIS — Z9861 Coronary angioplasty status: Secondary | ICD-10-CM | POA: Diagnosis not present

## 2021-01-31 DIAGNOSIS — J984 Other disorders of lung: Secondary | ICD-10-CM | POA: Diagnosis not present

## 2021-01-31 DIAGNOSIS — Z87891 Personal history of nicotine dependence: Secondary | ICD-10-CM | POA: Diagnosis not present

## 2021-01-31 DIAGNOSIS — Z8616 Personal history of COVID-19: Secondary | ICD-10-CM | POA: Diagnosis not present

## 2021-01-31 DIAGNOSIS — Z883 Allergy status to other anti-infective agents status: Secondary | ICD-10-CM | POA: Diagnosis not present

## 2021-01-31 DIAGNOSIS — Z20822 Contact with and (suspected) exposure to covid-19: Secondary | ICD-10-CM | POA: Diagnosis not present

## 2021-01-31 DIAGNOSIS — Z91041 Radiographic dye allergy status: Secondary | ICD-10-CM | POA: Diagnosis not present

## 2021-01-31 DIAGNOSIS — I739 Peripheral vascular disease, unspecified: Secondary | ICD-10-CM | POA: Diagnosis not present

## 2021-01-31 DIAGNOSIS — Z7982 Long term (current) use of aspirin: Secondary | ICD-10-CM | POA: Diagnosis not present

## 2021-01-31 DIAGNOSIS — Z91013 Allergy to seafood: Secondary | ICD-10-CM | POA: Diagnosis not present

## 2021-01-31 DIAGNOSIS — I4891 Unspecified atrial fibrillation: Secondary | ICD-10-CM | POA: Diagnosis not present

## 2021-01-31 DIAGNOSIS — I2510 Atherosclerotic heart disease of native coronary artery without angina pectoris: Secondary | ICD-10-CM | POA: Diagnosis not present

## 2021-01-31 DIAGNOSIS — R079 Chest pain, unspecified: Secondary | ICD-10-CM | POA: Diagnosis not present

## 2021-01-31 DIAGNOSIS — Z79890 Hormone replacement therapy (postmenopausal): Secondary | ICD-10-CM | POA: Diagnosis not present

## 2021-01-31 DIAGNOSIS — I10 Essential (primary) hypertension: Secondary | ICD-10-CM | POA: Diagnosis not present

## 2021-01-31 DIAGNOSIS — E785 Hyperlipidemia, unspecified: Secondary | ICD-10-CM | POA: Diagnosis not present

## 2021-02-01 ENCOUNTER — Telehealth: Payer: Self-pay | Admitting: Family Medicine

## 2021-02-01 DIAGNOSIS — R519 Headache, unspecified: Secondary | ICD-10-CM | POA: Diagnosis not present

## 2021-02-01 NOTE — Telephone Encounter (Signed)
Patient needs a hospital follow up, she is a 40 minute patient and only wants to see PCP. Was seen for elevated blood pressure. Please advise since there was only acute spots open.

## 2021-02-02 ENCOUNTER — Telehealth: Payer: Self-pay

## 2021-02-02 NOTE — Telephone Encounter (Signed)
Transition Care Management Unsuccessful Follow-up Telephone Call  Date of discharge and from where:  02/01/2021 from [facility]  Attempts:  1st Attempt  Reason for unsuccessful TCM follow-up call:  Left voice message

## 2021-02-02 NOTE — Telephone Encounter (Signed)
Okay to use next Wednesday 1130 slot

## 2021-02-02 NOTE — Telephone Encounter (Signed)
Appointment has been made. No further questions at this time.  

## 2021-02-06 ENCOUNTER — Encounter (HOSPITAL_BASED_OUTPATIENT_CLINIC_OR_DEPARTMENT_OTHER): Payer: Self-pay | Admitting: *Deleted

## 2021-02-06 ENCOUNTER — Other Ambulatory Visit: Payer: Self-pay

## 2021-02-06 ENCOUNTER — Emergency Department (HOSPITAL_BASED_OUTPATIENT_CLINIC_OR_DEPARTMENT_OTHER)
Admission: EM | Admit: 2021-02-06 | Discharge: 2021-02-07 | Disposition: A | Discharge status: Home / Self Care | Payer: Medicare Other | Attending: Emergency Medicine | Admitting: Emergency Medicine

## 2021-02-06 ENCOUNTER — Emergency Department (HOSPITAL_BASED_OUTPATIENT_CLINIC_OR_DEPARTMENT_OTHER): Payer: Medicare Other

## 2021-02-06 DIAGNOSIS — N183 Chronic kidney disease, stage 3 unspecified: Secondary | ICD-10-CM | POA: Insufficient documentation

## 2021-02-06 DIAGNOSIS — Z87891 Personal history of nicotine dependence: Secondary | ICD-10-CM | POA: Insufficient documentation

## 2021-02-06 DIAGNOSIS — R0789 Other chest pain: Secondary | ICD-10-CM | POA: Insufficient documentation

## 2021-02-06 DIAGNOSIS — Z7982 Long term (current) use of aspirin: Secondary | ICD-10-CM | POA: Diagnosis not present

## 2021-02-06 DIAGNOSIS — M549 Dorsalgia, unspecified: Secondary | ICD-10-CM | POA: Insufficient documentation

## 2021-02-06 DIAGNOSIS — K55069 Acute infarction of intestine, part and extent unspecified: Secondary | ICD-10-CM | POA: Diagnosis not present

## 2021-02-06 DIAGNOSIS — R1013 Epigastric pain: Secondary | ICD-10-CM | POA: Insufficient documentation

## 2021-02-06 DIAGNOSIS — R1084 Generalized abdominal pain: Secondary | ICD-10-CM | POA: Insufficient documentation

## 2021-02-06 DIAGNOSIS — I2510 Atherosclerotic heart disease of native coronary artery without angina pectoris: Secondary | ICD-10-CM | POA: Diagnosis not present

## 2021-02-06 DIAGNOSIS — K219 Gastro-esophageal reflux disease without esophagitis: Secondary | ICD-10-CM | POA: Insufficient documentation

## 2021-02-06 DIAGNOSIS — I700 Atherosclerosis of aorta: Secondary | ICD-10-CM | POA: Diagnosis not present

## 2021-02-06 DIAGNOSIS — Z9049 Acquired absence of other specified parts of digestive tract: Secondary | ICD-10-CM | POA: Diagnosis not present

## 2021-02-06 DIAGNOSIS — Z79899 Other long term (current) drug therapy: Secondary | ICD-10-CM | POA: Diagnosis not present

## 2021-02-06 DIAGNOSIS — I129 Hypertensive chronic kidney disease with stage 1 through stage 4 chronic kidney disease, or unspecified chronic kidney disease: Secondary | ICD-10-CM | POA: Insufficient documentation

## 2021-02-06 DIAGNOSIS — R079 Chest pain, unspecified: Secondary | ICD-10-CM | POA: Diagnosis not present

## 2021-02-06 DIAGNOSIS — I714 Abdominal aortic aneurysm, without rupture: Secondary | ICD-10-CM | POA: Diagnosis not present

## 2021-02-06 DIAGNOSIS — R0689 Other abnormalities of breathing: Secondary | ICD-10-CM | POA: Insufficient documentation

## 2021-02-06 LAB — CBC WITH DIFFERENTIAL/PLATELET
Abs Immature Granulocytes: 0.01 10*3/uL (ref 0.00–0.07)
Basophils Absolute: 0.1 10*3/uL (ref 0.0–0.1)
Basophils Relative: 1 %
Eosinophils Absolute: 0.1 10*3/uL (ref 0.0–0.5)
Eosinophils Relative: 2 %
HCT: 37.9 % (ref 36.0–46.0)
Hemoglobin: 13 g/dL (ref 12.0–15.0)
Immature Granulocytes: 0 %
Lymphocytes Relative: 35 %
Lymphs Abs: 2.1 10*3/uL (ref 0.7–4.0)
MCH: 30.2 pg (ref 26.0–34.0)
MCHC: 34.3 g/dL (ref 30.0–36.0)
MCV: 87.9 fL (ref 80.0–100.0)
Monocytes Absolute: 0.4 10*3/uL (ref 0.1–1.0)
Monocytes Relative: 7 %
Neutro Abs: 3.2 10*3/uL (ref 1.7–7.7)
Neutrophils Relative %: 55 %
Platelets: 291 10*3/uL (ref 150–400)
RBC: 4.31 MIL/uL (ref 3.87–5.11)
RDW: 11.9 % (ref 11.5–15.5)
WBC: 5.9 10*3/uL (ref 4.0–10.5)
nRBC: 0 % (ref 0.0–0.2)

## 2021-02-06 LAB — COMPREHENSIVE METABOLIC PANEL
ALT: 22 U/L (ref 0–44)
AST: 29 U/L (ref 15–41)
Albumin: 4.4 g/dL (ref 3.5–5.0)
Alkaline Phosphatase: 59 U/L (ref 38–126)
Anion gap: 9 (ref 5–15)
BUN: 13 mg/dL (ref 8–23)
CO2: 26 mmol/L (ref 22–32)
Calcium: 8.5 mg/dL — ABNORMAL LOW (ref 8.9–10.3)
Chloride: 100 mmol/L (ref 98–111)
Creatinine, Ser: 0.94 mg/dL (ref 0.44–1.00)
GFR, Estimated: >60 mL/min (ref >60)
Glucose, Bld: 114 mg/dL — ABNORMAL HIGH (ref 70–99)
Potassium: 4.2 mmol/L (ref 3.5–5.1)
Sodium: 135 mmol/L (ref 135–145)
Total Bilirubin: 0.3 mg/dL (ref 0.3–1.2)
Total Protein: 8 g/dL (ref 6.5–8.1)

## 2021-02-06 LAB — LIPASE, BLOOD: Lipase: 42 U/L (ref 11–51)

## 2021-02-06 LAB — BRAIN NATRIURETIC PEPTIDE: B Natriuretic Peptide: 32.1 pg/mL (ref 0.0–100.0)

## 2021-02-06 LAB — TROPONIN I (HIGH SENSITIVITY): Troponin I (High Sensitivity): 4 ng/L (ref <18)

## 2021-02-06 MED ORDER — ONDANSETRON HCL 4 MG/2ML IJ SOLN
4.0000 mg | Freq: Once | INTRAMUSCULAR | Status: AC
  Administered 2021-02-06: 4 mg via INTRAVENOUS
  Filled 2021-02-06: qty 2

## 2021-02-06 MED ORDER — ONDANSETRON HCL 4 MG/2ML IJ SOLN
4.0000 mg | Freq: Once | INTRAMUSCULAR | Status: AC
  Administered 2021-02-07: 4 mg via INTRAVENOUS
  Filled 2021-02-06: qty 2

## 2021-02-06 MED ORDER — LORAZEPAM 2 MG/ML IJ SOLN
1.0000 mg | Freq: Once | INTRAMUSCULAR | Status: AC | PRN
  Administered 2021-02-07: 1 mg via INTRAVENOUS
  Filled 2021-02-06: qty 1

## 2021-02-06 MED ORDER — FENTANYL CITRATE (PF) 100 MCG/2ML IJ SOLN
50.0000 ug | Freq: Once | INTRAMUSCULAR | Status: AC
  Administered 2021-02-06: 50 ug via INTRAVENOUS
  Filled 2021-02-06: qty 2

## 2021-02-06 NOTE — ED Notes (Signed)
Abdominal pain, primarily left side.  Also pain in her back above the L4-5 rods that intermittently shoots up to the thoracic spine.  Also c/o pain going down both legs with burning, tingling and weakness.

## 2021-02-06 NOTE — ED Triage Notes (Signed)
Back pain. Abdominal bloating and pain. Pain on and off for a week. She was seen at [facility] ER for same pain.

## 2021-02-06 NOTE — ED Provider Notes (Signed)
Patient transferred from [facility] for evaluation given abdominal pain and chest discomfort.  Initially MRI angio of the chest and abdomen were ordered given patient's severe contrast allergy.  Unfortunately, these 2 studies need to be done 12 hours apart and cannot be done together.  I interviewed the patient.  Her pain is predominantly abdominal.  Has a history of mesenteric ischemia and diverticulosis.  Feel abdominal imaging is most pertinent.  We will cancel MRI chest and obtain a noncontrast CT scan of the chest.  Patient redosed Zofran.  Results for orders placed or performed during the hospital encounter of 02/06/21  CBC with Differential  Result Value Ref Range   WBC 5.9 4.0 - 10.5 K/uL   RBC 4.31 3.87 - 5.11 MIL/uL   Hemoglobin 13.0 12.0 - 15.0 g/dL   HCT 37.9 36.0 - 46.0 %   MCV 87.9 80.0 - 100.0 fL   MCH 30.2 26.0 - 34.0 pg   MCHC 34.3 30.0 - 36.0 g/dL   RDW 11.9 11.5 - 15.5 %   Platelets 291 150 - 400 K/uL   nRBC 0.0 0.0 - 0.2 %   Neutrophils Relative % 55 %   Neutro Abs 3.2 1.7 - 7.7 K/uL   Lymphocytes Relative 35 %   Lymphs Abs 2.1 0.7 - 4.0 K/uL   Monocytes Relative 7 %   Monocytes Absolute 0.4 0.1 - 1.0 K/uL   Eosinophils Relative 2 %   Eosinophils Absolute 0.1 0.0 - 0.5 K/uL   Basophils Relative 1 %   Basophils Absolute 0.1 0.0 - 0.1 K/uL   Immature Granulocytes 0 %   Abs Immature Granulocytes 0.01 0.00 - 0.07 K/uL  Comprehensive metabolic panel  Result Value Ref Range   Sodium 135 135 - 145 mmol/L   Potassium 4.2 3.5 - 5.1 mmol/L   Chloride 100 98 - 111 mmol/L   CO2 26 22 - 32 mmol/L   Glucose, Bld 114 (H) 70 - 99 mg/dL   BUN 13 8 - 23 mg/dL   Creatinine, Ser 0.94 0.44 - 1.00 mg/dL   Calcium 8.5 (L) 8.9 - 10.3 mg/dL   Total Protein 8.0 6.5 - 8.1 g/dL   Albumin 4.4 3.5 - 5.0 g/dL   AST 29 15 - 41 U/L   ALT 22 0 - 44 U/L   Alkaline Phosphatase 59 38 - 126 U/L   Total Bilirubin 0.3 0.3 - 1.2 mg/dL   GFR, Estimated >60 >60 mL/min   Anion gap 9 5  - 15  Lipase, blood  Result Value Ref Range   Lipase 42 11 - 51 U/L  Brain natriuretic peptide  Result Value Ref Range   B Natriuretic Peptide 32.1 0.0 - 100.0 pg/mL  Troponin I (High Sensitivity)  Result Value Ref Range   Troponin I (High Sensitivity) 4 <18 ng/L  Troponin I (High Sensitivity)  Result Value Ref Range   Troponin I (High Sensitivity) 5 <18 ng/L   CT Chest Wo Contrast  Result Date: 02/07/2021 CLINICAL DATA:  Chest pain. EXAM: CT CHEST WITHOUT CONTRAST TECHNIQUE: Multidetector CT imaging of the chest was performed following the standard protocol without IV contrast. COMPARISON:  Chest radiograph dated 02/06/2021. FINDINGS: Evaluation of this exam is limited in the absence of intravenous contrast. Cardiovascular: There is no cardiomegaly or pericardial effusion. Coronary vascular calcification of the LAD. Mild atherosclerotic calcification of the thoracic aorta. 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 Evaluation of the stent is limited in the  absence of contrast. The central pulmonary arteries are grossly unremarkable. Mediastinum/Nodes: No hilar or mediastinal adenopathy. The esophagus is grossly unremarkable. No mediastinal fluid collection. Lungs/Pleura: Minimal bibasilar linear atelectasis/scarring. No focal consolidation, pleural effusion, pneumothorax. The central airways are patent. Upper Abdomen: High attenuating content within the renal collecting systems may be related to recent contrast administration or represent stone. Musculoskeletal: Osteopenia with degenerative changes of the spine. No acute osseous pathology. IMPRESSION: 1. No acute intrathoracic pathology. 2. Aortic Atherosclerosis (ICD10-I70.0). Electronically Signed   By: Anner Crete M.D.   On: 02/07/2021 02:22   MR ANGIO ABDOMEN W WO CONTRAST  Result Date: 02/07/2021 CLINICAL DATA:  Abdominal pain, aortic dissection suspected. Back pain, abdominal pain and abdominal  bloating. EXAM: MRA ABDOMEN AND PELVIS WITH CONTRAST TECHNIQUE: Multiplanar, multiecho pulse sequences of the abdomen and pelvis were obtained with intravenous contrast. Angiographic images of abdomen and pelvis were obtained using MRA technique with intravenous contrast. CONTRAST:  6.34m GADAVIST GADOBUTROL 1 MMOL/ML IV SOLN COMPARISON:  CT 11/23/2020, MRA 04/04/2016 FINDINGS: MRA ABDOMEN FINDINGS The abdominal aorta is of normal caliber. No aneurysm or dissection. Celiac trunk is widely patent. 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 Second order branches of the SMA appear patent. Terminal branches are not optimally opacified on this examination. Single renal arteries are widely patent bilaterally. Normal vascular morphology. No aneurysm. Inferior mesenteric artery is patent proximally. Lower extremity arterial inflow and visualized outflow is widely patent. Internal iliac arteries are patent bilaterally. Superior mesenteric vein, inferior mesenteric vein, splenic vein, and portal vein are patent. Hepatic veins are patent. Renal veins are patent. Ileo caval venous system is patent. MRI PELVIS FINDINGS Liver, spleen, pancreas, adrenal glands, kidneys, are unremarkable. Gallbladder absent. No intra or extrahepatic biliary ductal dilation. No pathologic adenopathy within the abdomen and pelvis. IMPRESSION: No evidence of aortic dissection or aneurysm. 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 Electronically Signed   By: AFidela SalisburyM.D.   On: 02/07/2021 01:49   DG Chest Portable 1 View  Result Date: 02/06/2021 CLINICAL DATA:  Chest and back pain. EXAM: PORTABLE CHEST 1 VIEW COMPARISON:  09/09/2019 FINDINGS: The cardiomediastinal contours are normal. Vascular stent in the region of the left subclavian. The lungs are clear. Pulmonary vasculature is normal. No consolidation, pleural  effusion, or pneumothorax. Surgical clips in the left supraclavicular region. No acute osseous abnormalities are seen. IMPRESSION: No acute chest findings. Electronically Signed   By: MKeith RakeM.D.   On: 02/06/2021 19:56   MM 3D SCREEN BREAST BILATERAL  Result Date: 01/12/2021 CLINICAL DATA:  Screening. EXAM: DIGITAL SCREENING BILATERAL MAMMOGRAM WITH TOMOSYNTHESIS AND CAD TECHNIQUE: Bilateral screening digital craniocaudal and mediolateral oblique mammograms were obtained. Bilateral screening digital breast tomosynthesis was performed. The images were evaluated with computer-aided detection. COMPARISON:  Previous exam(s). ACR Breast Density Category c: The breast tissue is heterogeneously dense, which may obscure small masses. FINDINGS: There are no findings suspicious for malignancy. IMPRESSION: No mammographic evidence of malignancy. A result letter of this screening mammogram will be mailed directly to the patient. RECOMMENDATION: Screening mammogram in one year. (Code:[id number]-01Y) BI-RADS CATEGORY  1: Negative. Electronically Signed   By: NAudie PintoM.D.   On: 01/12/2021 13:13    Physical Exam  BP (!) 125/53   Pulse 62   Temp 97.7 F (36.5 C) (Oral)   Resp 18   Ht 1.626 m ('5\' 4"'$ )   Wt  67.6 kg   SpO2 95%   BMI 25.58 kg/m   Physical Exam Awake, alert, no acute distress Left lower quadrant tenderness palpation, no rebound or guarding Pulmonary and cardiac exam is within normal limits. ED Course/Procedures     Procedures  MDM  MRIs reviewed.  No acute explanation for the patient's discomfort.  Chest work-up is also reassuring with negative troponins and a reassuring noncontrasted CT scan of the chest.  She has been hemodynamically stable.  Highly doubt thoracic dissection.  Patient has follow-up with gastroenterology and her primary physician.  Will discharge with pain and nausea medication.  Problem List Items Addressed This Visit   None Visit Diagnoses     Generalized  abdominal pain    -  Primary   Atypical chest pain                 Casmir Auguste, Barbette Hair, MD 02/07/21 [phone]

## 2021-02-06 NOTE — Telephone Encounter (Signed)
Transition Care Management Follow-up Telephone Call Date of discharge and from where: 02/01/2021 from [facility] How have you been since you were released from the hospital? Pt stated that she is still not filling very well. Pt does not  Any questions or concerns? No  Items Reviewed: Did the pt receive and understand the discharge instructions provided? Yes  Medications obtained and verified? Yes  Other? No  Any new allergies since your discharge? No  Dietary orders reviewed? No Do you have support at home? Yes   Functional Questionnaire: (I = Independent and D = Dependent) ADLs: I  Bathing/Dressing- I  Meal Prep- I  Eating- I  Maintaining continence- I  Transferring/Ambulation- I  Managing Meds- I   Follow up appointments reviewed:  PCP Hospital f/u appt confirmed? Yes  Scheduled to see Beatrice Lecher, MD on 02/28/2021 @ 10:30am. [facility] f/u appt confirmed? Yes  Scheduled to see Randall Hiss, NP on 02/09/2021 @ 10:00am. Are transportation arrangements needed? No  If their condition worsens, is the pt aware to call PCP or go to the Emergency Dept.? Yes Was the patient provided with contact information for the PCP's office or ED? Yes Was to pt encouraged to call back with questions or concerns? Yes

## 2021-02-06 NOTE — ED Notes (Signed)
MRI called at this time.

## 2021-02-06 NOTE — ED Notes (Signed)
Report called to [facility] ER Charge RN

## 2021-02-06 NOTE — ED Notes (Signed)
Pt arrived via [facility] from [facility], alert to baseline.

## 2021-02-06 NOTE — ED Notes (Addendum)
Pt provided warm blanket and is asking for something for nausea. Advised pt would speak with provider reference same. Sent provider message reference same

## 2021-02-06 NOTE — ED Provider Notes (Signed)
[facility] EMERGENCY DEPARTMENT Provider Note   CSN: [id number] Arrival date & time: 02/06/21  1848     History Chief Complaint  Patient presents with   Back Pain    Traci [name] is a 75 y.o. female.  HPI     75 year old female with a history of atrial fibrillation, coronary artery disease, claudication, hypertension, hyperlipidemia, who presents with abdominal pain and back pain.  States she was seen in [place] for similar.  Had symptoms one week ago thought it was diverticulitis, placed self on clear liquid diet, but today is when symptoms really worsened. Has pain in the center of the back, pounding, sharp pain with radiation to epigastric area of abdomen.  Not sure if two different issues of abdomen and back pain.  Pain currently 9/10, abdominal pain all over, but especially epigastric area, feels bloated, tender, swollen 8/10 pain.  Feels similar to pain that she had with her mesenteric ischemia.   Nausea, taking medication which helps, drinking clear liquids, clear broth. Yesterday tried mashed potatoes, pain not worse with eating.  No vomiting.  Diarrhea over weekend but since then no diarrhea, is burping, not passing flatus, Last BM was the diarrhea thurs-Friday Has prescription for linzess but has not taken it because of symptoms Has chest pain middle of chest and also bottom of ribs, feels like a bubble, sharp epigastric pain, started Friday coming and going.   No fever, has had chills, had covid/flu testing negative No urinary symptoms, less urination though, drinking fluids  She was recently seen in the [facility] emergency department with elevated blood pressure, nausea.  She had a head CT which showed no acute intracranial abnormalities.  Negative COVID, flu, RSV testing, normal troponin, chest x-ray with mild left basilar densities likely secondary to subsegmental atelectasis and less likely developing infiltrates  Past Medical  History:  Diagnosis Date   Anxiety    Atrial fibrillation (HCC)    CAD (coronary artery disease)    Claudication ([place]) 01/06/2008   Lower extremity dopplers - no evidence of arterial insufficiency, normal exam   Coronary artery disease    GERD (gastroesophageal reflux disease)    Hyperlipidemia    Hypertension 11/30/2010   echo- EF 55%; normal w/ mildly sclerotic aortic valve   Hypertension 11/05/11   renal dopplers - celiac artery and SMA >50% diameter reduction, R renal artery - mildly elevated velocities 1-59% diameter reduction, L renal artery normal   Nonspecific ST-T wave electrocardiographic changes 03/26/2011   R/Lmv - EF 74%, normal perfusion all regions, ST depression w/ Lexiscan infusion w/o assoc angina   Osteopenia    PAD (peripheral artery disease) (HCC)    Peripheral vascular disease (HCC)    PVD (peripheral vascular disease) ([place]) 11/05/2011   doppler - R/L brachial pressures essentially equal w/o inflow disease; L sublclavian/CCA bypass graft demonstrates patent flow, no evidence of significant stenosis   Sigmoid diverticulitis     Patient Active Problem List   Diagnosis Date Noted   Sprain of anterior talofibular ligament of left ankle 09/28/2020   Edema 09/03/2019   Epigastric pain 08/11/2019   Insomnia 08/10/2019   Hypocalcemia 06/03/2019   Atherosclerosis of aorta ([place]) 01/04/2018   Osteoporosis 12/25/2017   CKD (chronic kidney disease) stage 3, GFR 30-59 ml/min ([place]) 08/04/2017   Primary osteoarthritis of first carpometacarpal joint of left hand 07/10/2017   Strain of right Achilles tendon 04/14/2017   Atypical nevus 08/22/2016   Acute bilateral low back pain with left-sided sciatica  06/06/2016   Menopause 12/08/2015   History of colonoscopy 07/17/2015   Irregular heartbeat 07/26/2014   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[place]) 01/16/2014   Irritable bowel syndrome 09/21/2013   Hyperlipidemia 04/05/2013   Hypertension 12/06/2012    Peripheral arterial disease ([place]) 12/06/2012   CAD, cath 2009 12/06/2012   Hyponatremia, improved holding diuretic 12/06/2012   Lymphocele of left arm 07/14/2012   Atherosclerosis of other specified arteries 04/02/2012   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 CFX, RCA OK.   CATARACT EXTRACTION     CHOLECYSTECTOMY     EYE SURGERY     retinal surgery   SPINE SURGERY  Feb. 2011   [place]   X's  several   SUBCLAVIAN ARTERY STENT Left 09/20/2008   LSA ISR 7x43m Cordis Genesis on Opta premount, reduction from 80% to 0%   subclavian artery stents  01/23/2010   Left carotid to subclavian artery Bypass   VISCERAL ANGIOGRAM  01/17/2014   Procedure: VISCERAL ANGIOGRAM;  Surgeon: CAngelia Mould MD;  [facility];  Service: Cardiovascular;;     OB History   No obstetric history on file.     Family History  Problem Relation Age of Onset   Heart disease Father 670      Heart Disease before age 74  Kidney disease Father    Heart attack Father    Hyperlipidemia Father    Hypertension Father    Kidney failure Mother    Diabetes Maternal Grandmother    Heart disease Paternal Grandfather     Social History   Tobacco Use   Smoking status: Former    Packs/day: 0.25    Years: 20.00    Pack years: 5.00    Types: Cigarettes    Quit date: 09/30/1993    Years since quitting: 27.3   Smokeless tobacco: Never  Vaping Use   Vaping Use: Never used  Substance Use Topics    Alcohol use: No   Drug use: No    Home Medications Prior to Admission medications   Medication Sig Start Date End Date Taking? Authorizing Provider  acetaminophen (TYLENOL) 325 MG tablet Take 325 mg by mouth every 6 (six) hours as needed for moderate pain or headache.   Yes [provider]  aspirin EC 81 MG tablet Take 81 mg by mouth daily.   Yes [provider]  atenolol (TENORMIN) 25 MG tablet TAKE 1 TABLET DAILY Patient taking differently: Take 25 mg by mouth daily. 01/29/21  Yes Croitoru, Mihai, MD  atorvastatin (LIPITOR) 10 MG tablet TAKE 1 TABLET DAILY AT 6 P.M. Patient taking differently: Take 10 mg by mouth daily. 07/12/20  Yes Croitoru, Mihai, MD  candesartan (ATACAND) 16 MG tablet TAKE ONE-HALF (1/2) TABLET DAILY Patient taking differently: Take 8 mg by mouth daily. 07/12/20  Yes Croitoru, Mihai, MD  cholecalciferol (VITAMIN D) 1000 UNITS tablet Take 1,000 Units by mouth daily.   Yes [provider]  clopidogrel (PLAVIX)  75 MG tablet TAKE 1 TABLET DAILY Patient taking differently: Take 75 mg by mouth daily. 07/12/20  Yes Croitoru, Mihai, MD  clorazepate (TRANXENE) 7.5 MG tablet TAKE 1 TABLET DAILY AS NEEDED Patient taking differently: Take 7.5 mg by mouth daily. 01/02/21  Yes Hali Marry, MD  cyanocobalamin 1000 MCG tablet Take 1,000 mcg by mouth daily.   Yes [provider]  diphenhydrAMINE (SOMINEX) 25 MG tablet Take 25 mg by mouth at bedtime as needed for allergies.   Yes [provider]  EPINEPHrine (EPIPEN 2-PAK) 0.3 mg/0.3 mL IJ SOAJ injection Inject 0.3 mLs (0.3 mg total) into the muscle as needed (for allergic reaction). 12/23/17  Yes Gregor Hams, MD  esomeprazole (NEXIUM) 40 MG capsule Take 1 capsule (40 mg total) by mouth daily. Take 1 tab daily Patient taking differently: Take 40 mg by mouth daily. 02/23/15  Yes Croitoru, Mihai, MD  estradiol (ESTRACE) 0.5 MG tablet Take 1 tablet (0.5 mg total) by mouth daily. 10/24/20   Yes Hali Marry, MD  furosemide (LASIX) 20 MG tablet TAKE 1 TABLET EVERY OTHER DAY Patient taking differently: Take 20 mg by mouth every Monday, Wednesday, and Friday. 08/01/20  Yes Croitoru, Mihai, MD  linaclotide (LINZESS) 145 MCG CAPS capsule Take 145 mcg by mouth 2 (two) times a week. Sunday and wednesday 12/22/15  Yes [provider]  meclizine (ANTIVERT) 25 MG tablet TAKE ONE-HALF (1/2) TABLET THREE TIMES A DAY AS NEEDED FOR DIZZINESS OR NAUSEA Patient taking differently: Take 12.5 mg by mouth 3 (three) times daily as needed for dizziness or nausea. 09/04/20  Yes Hali Marry, MD  Multiple Vitamin (MULTIVITAMIN WITH MINERALS) TABS Take 1 tablet by mouth daily.   Yes [provider]  ondansetron (ZOFRAN ODT) 4 MG disintegrating tablet Take 1 tablet (4 mg total) by mouth every 8 (eight) hours as needed for nausea or vomiting. 02/07/21  Yes Horton, Barbette Hair, MD  oxyCODONE-acetaminophen (PERCOCET/ROXICET) 5-325 MG tablet Take 1 tablet by mouth every 6 (six) hours as needed for severe pain. 02/07/21  Yes Horton, Barbette Hair, MD  zolpidem (AMBIEN) 5 MG tablet Take 1 tablet (5 mg total) by mouth at bedtime as needed for sleep. 01/05/21  Yes Hali Marry, MD  nystatin (MYCOSTATIN/NYSTOP) powder Apply 1 application topically 2 (two) times daily. X 10 days Patient not taking: No sig reported 01/08/21   Hali Marry, MD    Allergies    Cortisone, Dilaudid [hydromorphone hcl], Iodine, Medrol [methylprednisolone], Omnipaque [iohexol], Prednisone, Shellfish allergy, Sulfa drugs cross reactors, Z-pak [azithromycin], Doxycycline, Ivp dye [iodinated diagnostic agents], Methylprednisolone sodium succ, Sulfa antibiotics, Augmentin [amoxicillin-pot clavulanate], Codeine, Erythromycin, and Morphine and related  Review of Systems   Review of Systems  Constitutional:  Positive for appetite change, chills, diaphoresis and fatigue. Negative for fever.  HENT:  Negative  for sore throat.   Eyes:  Negative for visual disturbance.  Respiratory:  Negative for cough and shortness of breath.   Cardiovascular:  Positive for chest pain.  Gastrointestinal:  Positive for abdominal pain, constipation, diarrhea (had initially) and nausea. Negative for vomiting.  Genitourinary:  Negative for difficulty urinating.  Musculoskeletal:  Positive for back pain. Negative for neck pain.  Skin:  Negative for rash.  Neurological:  Negative for syncope, facial asymmetry, speech difficulty, weakness, numbness and headaches.   Physical Exam Updated Vital Signs BP (!) 136/59 (BP Location: Right Arm)   Pulse (!) 59   Temp 98.1 F (36.7 C) (Oral)   Resp  16   Ht '5\' 4"'$  (1.626 m)   Wt 67.6 kg   SpO2 99%   BMI 25.58 kg/m   Physical Exam Vitals and nursing note reviewed.  Constitutional:      General: She is not in acute distress.    Appearance: She is well-developed. She is not diaphoretic.  HENT:     Head: Normocephalic and atraumatic.  Eyes:     Conjunctiva/sclera: Conjunctivae normal.  Cardiovascular:     Rate and Rhythm: Normal rate and regular rhythm.     Heart sounds: Normal heart sounds. No murmur heard.   No friction rub. No gallop.  Pulmonary:     Effort: Pulmonary effort is normal. No respiratory distress.     Breath sounds: Normal breath sounds. No wheezing or rales.  Abdominal:     General: There is no distension.     Palpations: Abdomen is soft.     Tenderness: There is abdominal tenderness (diffuse, worse left side). There is no guarding.  Musculoskeletal:        General: No tenderness.     Cervical back: Normal range of motion.  Skin:    General: Skin is warm and dry.     Findings: No erythema or rash.  Neurological:     Mental Status: She is alert and oriented to person, place, and time.    ED Results / Procedures / Treatments   Labs (all labs ordered are listed, but only abnormal results are displayed) Labs Reviewed  COMPREHENSIVE METABOLIC  PANEL - Abnormal; Notable for the following components:      Result Value   Glucose, Bld 114 (*)    Calcium 8.5 (*)    All other components within normal limits  CBC WITH DIFFERENTIAL/PLATELET  LIPASE, BLOOD  BRAIN NATRIURETIC PEPTIDE  URINALYSIS, ROUTINE W REFLEX MICROSCOPIC  TROPONIN I (HIGH SENSITIVITY)  TROPONIN I (HIGH SENSITIVITY)    EKG EKG Interpretation  Date/Time:  Tuesday February 06 2021 20:04:29 EDT Ventricular Rate:  62 PR Interval:  154 QRS Duration: 106 QT Interval:  426 QTC Calculation: 433 R Axis:   36 Text Interpretation: Sinus rhythm Confirmed by Lennice Sites ([id number]) on 02/07/2021 7:21:28 AM  Radiology CT Chest Wo Contrast  Result Date: 02/07/2021 CLINICAL DATA:  Chest pain. EXAM: CT CHEST WITHOUT CONTRAST TECHNIQUE: Multidetector CT imaging of the chest was performed following the standard protocol without IV contrast. COMPARISON:  Chest radiograph dated 02/06/2021. FINDINGS: Evaluation of this exam is limited in the absence of intravenous contrast. Cardiovascular: There is no cardiomegaly or pericardial effusion. Coronary vascular calcification of the LAD. Mild atherosclerotic calcification of the thoracic aorta. 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 Evaluation of the stent is limited in the absence of contrast. The central pulmonary arteries are grossly unremarkable. Mediastinum/Nodes: No hilar or mediastinal adenopathy. The esophagus is grossly unremarkable. No mediastinal fluid collection. Lungs/Pleura: Minimal bibasilar linear atelectasis/scarring. No focal consolidation, pleural effusion, pneumothorax. The central airways are patent. Upper Abdomen: High attenuating content within the renal collecting systems may be related to recent contrast administration or represent stone. Musculoskeletal: Osteopenia with degenerative changes of the spine. No acute osseous pathology. IMPRESSION: 1. No acute intrathoracic pathology. 2.  Aortic Atherosclerosis (ICD10-I70.0). Electronically Signed   By: Anner Crete M.D.   On: 02/07/2021 02:22   MR ANGIO ABDOMEN W WO CONTRAST  Result Date: 02/07/2021 CLINICAL DATA:  Abdominal pain, aortic dissection suspected. Back pain, abdominal pain and abdominal bloating. EXAM: MRA ABDOMEN AND PELVIS WITH  CONTRAST TECHNIQUE: Multiplanar, multiecho pulse sequences of the abdomen and pelvis were obtained with intravenous contrast. Angiographic images of abdomen and pelvis were obtained using MRA technique with intravenous contrast. CONTRAST:  6.65m GADAVIST GADOBUTROL 1 MMOL/ML IV SOLN COMPARISON:  CT 11/23/2020, MRA 04/04/2016 FINDINGS: MRA ABDOMEN FINDINGS The abdominal aorta is of normal caliber. No aneurysm or dissection. Celiac trunk is widely patent. 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 Second order branches of the SMA appear patent. Terminal branches are not optimally opacified on this examination. Single renal arteries are widely patent bilaterally. Normal vascular morphology. No aneurysm. Inferior mesenteric artery is patent proximally. Lower extremity arterial inflow and visualized outflow is widely patent. Internal iliac arteries are patent bilaterally. Superior mesenteric vein, inferior mesenteric vein, splenic vein, and portal vein are patent. Hepatic veins are patent. Renal veins are patent. Ileo caval venous system is patent. MRI PELVIS FINDINGS Liver, spleen, pancreas, adrenal glands, kidneys, are unremarkable. Gallbladder absent. No intra or extrahepatic biliary ductal dilation. No pathologic adenopathy within the abdomen and pelvis. IMPRESSION: No evidence of aortic dissection or aneurysm. 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 Electronically Signed   By: AFidela SalisburyM.D.   On: 02/07/2021 01:49   DG Chest Portable 1 View  Result Date:  02/06/2021 CLINICAL DATA:  Chest and back pain. EXAM: PORTABLE CHEST 1 VIEW COMPARISON:  09/09/2019 FINDINGS: The cardiomediastinal contours are normal. Vascular stent in the region of the left subclavian. The lungs are clear. Pulmonary vasculature is normal. No consolidation, pleural effusion, or pneumothorax. Surgical clips in the left supraclavicular region. No acute osseous abnormalities are seen. IMPRESSION: No acute chest findings. Electronically Signed   By: MKeith RakeM.D.   On: 02/06/2021 19:56    Procedures Procedures   Medications Ordered in ED Medications  LORazepam (ATIVAN) injection 1 mg (1 mg Intravenous Given 02/07/21 0047)  fentaNYL (SUBLIMAZE) injection 50 mcg (50 mcg Intravenous Given 02/06/21 2023)  ondansetron (ZOFRAN) injection 4 mg (4 mg Intravenous Given 02/06/21 2018)  ondansetron (ZOFRAN) injection 4 mg (4 mg Intravenous Given 02/06/21 2120)  ondansetron (ZOFRAN) injection 4 mg (4 mg Intravenous Given 02/07/21 0013)  gadobutrol (GADAVIST) 1 MMOL/ML injection 6.5 mL (6.5 mLs Intravenous Contrast Given 02/07/21 0125)    ED Course  I have reviewed the triage vital signs and the nursing notes.  Pertinent labs & imaging results that were available during my care of the patient were reviewed by me and considered in my medical decision making (see chart for details).    MDM Rules/Calculators/A&P                           75year old female with a history of atrial fibrillation, coronary artery disease, claudication, hypertension, hyperlipidemia, PVD with history of aorto-SMA bypass graft, subclavian artery stent, cholecystectomy, appendectomy, hysterectomy, who presents with abdominal pain and back pain.  DDx includes aortic dissection, ACS, diverticulitis, pancreatitis, SBO, acute mesenteric ischemia.    Given description of chest and back pain radiating to the abdomen and anaphylaxis to contrast[facility],  vascular occlusion or other abnormalities.  Labs so far do not suggest acs, pancreatitis, biliary obstruction, anemia or significant electrolyte abnormalities.   Final Clinical Impression(s) / ED Diagnoses Final diagnoses:  Generalized abdominal pain  Atypical chest pain    Rx / DC Orders  ED Discharge Orders          Ordered    oxyCODONE-acetaminophen (PERCOCET/ROXICET) 5-325 MG tablet  Every 6 hours PRN        02/07/21 0510    ondansetron (ZOFRAN ODT) 4 MG disintegrating tablet  Every 8 hours PRN        02/07/21 0510             Gareth Morgan, MD 02/07/21 1025

## 2021-02-06 NOTE — ED Notes (Signed)
Report given to [facility] upon arrival for transport. 

## 2021-02-07 ENCOUNTER — Emergency Department (HOSPITAL_COMMUNITY): Payer: Medicare Other

## 2021-02-07 ENCOUNTER — Telehealth: Payer: Medicare Other | Admitting: Family Medicine

## 2021-02-07 DIAGNOSIS — K55069 Acute infarction of intestine, part and extent unspecified: Secondary | ICD-10-CM | POA: Diagnosis not present

## 2021-02-07 DIAGNOSIS — R1084 Generalized abdominal pain: Secondary | ICD-10-CM | POA: Diagnosis not present

## 2021-02-07 DIAGNOSIS — I700 Atherosclerosis of aorta: Secondary | ICD-10-CM | POA: Diagnosis not present

## 2021-02-07 DIAGNOSIS — Z9049 Acquired absence of other specified parts of digestive tract: Secondary | ICD-10-CM | POA: Diagnosis not present

## 2021-02-07 DIAGNOSIS — R079 Chest pain, unspecified: Secondary | ICD-10-CM | POA: Diagnosis not present

## 2021-02-07 DIAGNOSIS — I714 Abdominal aortic aneurysm, without rupture: Secondary | ICD-10-CM | POA: Diagnosis not present

## 2021-02-07 LAB — TROPONIN I (HIGH SENSITIVITY): Troponin I (High Sensitivity): 5 ng/L (ref <18)

## 2021-02-07 MED ORDER — ONDANSETRON 4 MG PO TBDP: 4.0000 mg | ORAL_TABLET | Freq: Three times a day (TID) | ORAL | 0 refills | Status: DC | PRN

## 2021-02-07 MED ORDER — GADOBUTROL 1 MMOL/ML IV SOLN
6.5000 mL | Freq: Once | INTRAVENOUS | Status: AC | PRN
  Administered 2021-02-07: 6.5 mL via INTRAVENOUS

## 2021-02-07 MED ORDER — OXYCODONE-ACETAMINOPHEN 5-325 MG PO TABS: 1.0000 | ORAL_TABLET | Freq: Four times a day (QID) | ORAL | 0 refills | Status: DC | PRN

## 2021-02-07 NOTE — ED Notes (Signed)
Provider at bedside at this time

## 2021-02-07 NOTE — ED Notes (Signed)
Question brought up about pt being allergic to contrast and was advised per provider pt is allergic to ct contrast and not the MRI contrast and that is why she was sent here for the MRI. Provided pt and transport this information

## 2021-02-07 NOTE — ED Notes (Signed)
Pt ambulated to restroom at this time.

## 2021-02-07 NOTE — Discharge Instructions (Addendum)
You are seen today for both abdominal and chest pain.  Your work-up is reassuring.  Your grafts in your abdomen appear patent.  Follow-up closely with gastroenterology as planned.  Follow-up with your primary doctor as well.  If anything new or worsens, you should be reevaluated

## 2021-02-07 NOTE — ED Notes (Signed)
Pt transported to MRI 

## 2021-02-07 NOTE — ED Notes (Signed)
Returned from MRI at this time.

## 2021-02-07 NOTE — ED Notes (Signed)
Transport here to take pt to MRI at this time.

## 2021-02-08 DIAGNOSIS — I10 Essential (primary) hypertension: Secondary | ICD-10-CM | POA: Diagnosis not present

## 2021-02-08 DIAGNOSIS — Z6826 Body mass index (BMI) 26.0-26.9, adult: Secondary | ICD-10-CM | POA: Diagnosis not present

## 2021-02-08 DIAGNOSIS — M5412 Radiculopathy, cervical region: Secondary | ICD-10-CM | POA: Diagnosis not present

## 2021-02-09 DIAGNOSIS — K219 Gastro-esophageal reflux disease without esophagitis: Secondary | ICD-10-CM | POA: Diagnosis not present

## 2021-02-09 DIAGNOSIS — K5904 Chronic idiopathic constipation: Secondary | ICD-10-CM | POA: Diagnosis not present

## 2021-02-09 DIAGNOSIS — K5792 Diverticulitis of intestine, part unspecified, without perforation or abscess without bleeding: Secondary | ICD-10-CM | POA: Diagnosis not present

## 2021-02-16 ENCOUNTER — Other Ambulatory Visit: Payer: Self-pay | Admitting: Neurosurgery

## 2021-02-16 DIAGNOSIS — M5412 Radiculopathy, cervical region: Secondary | ICD-10-CM

## 2021-02-21 DIAGNOSIS — H43813 Vitreous degeneration, bilateral: Secondary | ICD-10-CM | POA: Diagnosis not present

## 2021-02-21 DIAGNOSIS — H43391 Other vitreous opacities, right eye: Secondary | ICD-10-CM | POA: Diagnosis not present

## 2021-02-21 DIAGNOSIS — H35371 Puckering of macula, right eye: Secondary | ICD-10-CM | POA: Diagnosis not present

## 2021-02-28 ENCOUNTER — Encounter: Payer: Self-pay | Admitting: Family Medicine

## 2021-02-28 ENCOUNTER — Ambulatory Visit (INDEPENDENT_AMBULATORY_CARE_PROVIDER_SITE_OTHER): Payer: Medicare Other | Admitting: Family Medicine

## 2021-02-28 VITALS — BP 122/56 | HR 57 | Ht 64.0 in | Wt 158.0 lb

## 2021-02-28 DIAGNOSIS — Z23 Encounter for immunization: Secondary | ICD-10-CM

## 2021-02-28 DIAGNOSIS — N1831 Chronic kidney disease, stage 3a: Secondary | ICD-10-CM

## 2021-02-28 DIAGNOSIS — I10 Essential (primary) hypertension: Secondary | ICD-10-CM

## 2021-02-28 DIAGNOSIS — I708 Atherosclerosis of other arteries: Secondary | ICD-10-CM

## 2021-02-28 DIAGNOSIS — G4700 Insomnia, unspecified: Secondary | ICD-10-CM

## 2021-02-28 NOTE — Progress Notes (Signed)
Established Patient Office Visit  Subjective:  Patient ID: Traci [name], female    DOB: 05/31/1946  Age: 75 y.o. MRN: [id number]  CC:  Chief Complaint  Patient presents with   Hypertension     HPI [name] presents for   Hypertension- Pt denies chest pain, SOB, dizziness, or heart palpitations.  Taking meds as directed w/o problems.  Denies medication side effects.    She has been having neck pain with radicular symptoms going down particular into her left shoulder and all the way into her left arm so she has been working with her neurologist.  She is actually scheduled for an MRI on Sunday of the upper right thoracic spine and neck.  She is actually scheduled for a nerve conduction study on October 4.  Recently she had a couple of episodes where her blood pressure shot up dramatically.  The neurologist felt like it could have been related to some of the nerve compression going on in her cervical spine.  She also had a question about her CT that was performed in August.  They indicated a possible stone in the kidney versus just contrast.  And she had some questions about that.  She did follow-up with GI since I last saw her for her chronic constipation and GERD as well as diverticulitis.  She has had a couple of mild diverticulitis flares this year.  Past Medical History:  Diagnosis Date   Anxiety    Atrial fibrillation ([place])    CAD (coronary artery disease)    Claudication ([place]) 01/06/2008   Lower extremity dopplers - no evidence of arterial insufficiency, normal exam   Coronary artery disease    GERD (gastroesophageal reflux disease)    Hyperlipidemia    Hypertension 11/30/2010   echo- EF 55%; normal w/ mildly sclerotic aortic valve   Hypertension 11/05/11   renal dopplers - celiac artery and SMA >50% diameter reduction, R renal artery - mildly elevated velocities 1-59% diameter reduction, L renal artery normal   Nonspecific ST-T wave  electrocardiographic changes 03/26/2011   R/Lmv - EF 74%, normal perfusion all regions, ST depression w/ Lexiscan infusion w/o assoc angina   Osteopenia    PAD (peripheral artery disease) (HCC)    Peripheral vascular disease (HCC)    PVD (peripheral vascular disease) ([place]) 11/05/2011   doppler - R/L brachial pressures essentially equal w/o inflow disease; L sublclavian/CCA bypass graft demonstrates patent flow, no evidence of significant stenosis   Sigmoid diverticulitis     Past Surgical History:  Procedure Laterality Date   ABDOMINAL AORTIC ANEURYSM REPAIR N/A 01/27/2014   Procedure: AORTO-SUPERIOR MESENTERIC ARTERY BYPASS GRAFT;  Surgeon: Angelia Mould, MD;  Location: [facility];  Service: Vascular;  Laterality: N/A;   ABDOMINAL HYSTERECTOMY     APPENDECTOMY     CARDIAC CATHETERIZATION  06/03/2008   60% LAD, involving D2, borderline significant by IVUS, medical therapy. CFX, RCA OK.   CATARACT EXTRACTION     CHOLECYSTECTOMY     EYE SURGERY     retinal surgery   SPINE SURGERY  Feb. 2011   [place]   X's  several   SUBCLAVIAN ARTERY STENT Left 09/20/2008   LSA ISR 7x4m Cordis Genesis on Opta premount, reduction from 80% to 0%   subclavian artery stents  01/23/2010   Left carotid to subclavian artery Bypass   VISCERAL ANGIOGRAM  01/17/2014   Procedure: VISCERAL ANGIOGRAM;  Surgeon: CAngelia Mould MD;  [facility];  Service: Cardiovascular;;    Family History  Problem Relation Age of Onset   Heart disease Father 44       Heart Disease before age 4   Kidney disease Father    Heart attack Father    Hyperlipidemia Father    Hypertension Father    Kidney failure Mother    Diabetes Maternal Grandmother    Heart disease Paternal Grandfather     Social History   Socioeconomic History   Marital status: Widowed    Spouse name: Not on file   Number of children: 1   Years of education: 16   Highest education level: Bachelor's degree (e.g.,  BA, AB, BS)  Occupational History   Occupation: retired    Comment: Nurse  Tobacco Use   Smoking status: Former    Packs/day: 0.25    Years: 20.00    Pack years: 5.00    Types: Cigarettes    Quit date: 09/30/1993    Years since quitting: 27.4   Smokeless tobacco: Never  Vaping Use   Vaping Use: Never used  Substance and Sexual Activity   Alcohol use: No   Drug use: No   Sexual activity: Not Currently  Other Topics Concern   Not on file  Social History Narrative   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 No caffeine use. Likes to go dancing once a week. Likes play online games such as scrabble.    Social Determinants of Health   Financial Resource Strain: Low Risk    Difficulty of Paying Living Expenses: Not hard at all  Food Insecurity: No Food Insecurity   Worried About Charity fundraiser in the Last Year: Never true   [place] in the Last Year: Never true  Transportation Needs: No Transportation Needs   Lack of Transportation (Medical): No   Lack of Transportation (Non-Medical): No  Physical Activity: Inactive   Days of Exercise per Week: 0 days   Minutes of Exercise per Session: 0 min  Stress: No Stress Concern Present   Feeling of Stress : Not at all  Social Connections: Moderately Isolated   Frequency of Communication with Friends and Family: More than three times a week   Frequency of Social Gatherings with Friends and Family: More than three times a week   Attends Religious Services: Never   Marine scientist or Organizations: Yes   Attends Music therapist: More than 4 times per year   Marital Status: Widowed  Human resources officer Violence: Not At Risk   Fear of Current or Ex-Partner: No   Emotionally Abused: No   Physically Abused: No   Sexually Abused: No    Outpatient Medications Prior to Visit  Medication Sig Dispense Refill   acetaminophen (TYLENOL) 325 MG tablet Take 325 mg by mouth every 6 (six) hours as needed  for moderate pain or headache.     aspirin EC 81 MG tablet Take 81 mg by mouth daily.     atenolol (TENORMIN) 25 MG tablet TAKE 1 TABLET DAILY 90 tablet 3   atorvastatin (LIPITOR) 10 MG tablet TAKE 1 TABLET DAILY AT 6 P.M. 90 tablet 3   candesartan (ATACAND) 16 MG tablet TAKE ONE-HALF (1/2) TABLET DAILY 45 tablet 3   cholecalciferol (VITAMIN D) 1000 UNITS tablet Take 1,000 Units by mouth daily.     clopidogrel (PLAVIX) 75 MG tablet TAKE 1 TABLET DAILY 90 tablet 3   clorazepate (TRANXENE) 7.5 MG tablet  TAKE 1 TABLET DAILY AS NEEDED 90 tablet 0   cyanocobalamin 1000 MCG tablet Take 1,000 mcg by mouth daily.     EPINEPHrine (EPIPEN 2-PAK) 0.3 mg/0.3 mL IJ SOAJ injection Inject 0.3 mLs (0.3 mg total) into the muscle as needed (for allergic reaction). 2 Device 0   esomeprazole (NEXIUM) 40 MG capsule Take 1 capsule (40 mg total) by mouth daily. Take 1 tab daily 90 capsule 0   estradiol (ESTRACE) 0.5 MG tablet Take 1 tablet (0.5 mg total) by mouth daily. 90 tablet 1   furosemide (LASIX) 20 MG tablet TAKE 1 TABLET EVERY OTHER DAY 45 tablet 3   linaclotide (LINZESS) 145 MCG CAPS capsule Take 145 mcg by mouth 2 (two) times a week. Sunday and wednesday     meclizine (ANTIVERT) 25 MG tablet TAKE ONE-HALF (1/2) TABLET THREE TIMES A DAY AS NEEDED FOR DIZZINESS OR NAUSEA 60 tablet 8   Multiple Vitamin (MULTIVITAMIN WITH MINERALS) TABS Take 1 tablet by mouth daily.     zolpidem (AMBIEN) 5 MG tablet Take 1 tablet (5 mg total) by mouth at bedtime as needed for sleep. 90 tablet 1   diphenhydrAMINE (SOMINEX) 25 MG tablet Take 25 mg by mouth at bedtime as needed for allergies.     nystatin (MYCOSTATIN/NYSTOP) powder Apply 1 application topically 2 (two) times daily. X 10 days (Patient not taking: No sig reported) 60 g 1   ondansetron (ZOFRAN ODT) 4 MG disintegrating tablet Take 1 tablet (4 mg total) by mouth every 8 (eight) hours as needed for nausea or vomiting. 20 tablet 0   oxyCODONE-acetaminophen  (PERCOCET/ROXICET) 5-325 MG tablet Take 1 tablet by mouth every 6 (six) hours as needed for severe pain. 10 tablet 0   No facility-administered medications prior to visit.    Allergies  Allergen Reactions   Cortisone Anaphylaxis   Dilaudid [Hydromorphone Hcl] Nausea And Vomiting    Pt states she will start vomiting immediately for 6 hours   Iodine Anaphylaxis   Medrol [Methylprednisolone] Anaphylaxis   Omnipaque [Iohexol] Anaphylaxis   Prednisone Anaphylaxis and Swelling   Shellfish Allergy Anaphylaxis   Sulfa Drugs Cross Reactors Anaphylaxis   Z-Pak [Azithromycin] Swelling   Doxycycline     Thrush/itching   Ivp Dye [Iodinated Diagnostic Agents]     Other reaction(s): Unknown   Methylprednisolone Sodium Succ Swelling    Blood pressure drops immediately   Sulfa Antibiotics Other (See Comments)    Stomach upset   Augmentin [Amoxicillin-Pot Clavulanate] Nausea Only    Upset stomach (Has taken cephalexin in the past without problems)   Codeine Rash and Other (See Comments)    Upset stomach   Erythromycin Rash   Morphine And Related Nausea Only    nausea    ROS Review of Systems    Objective:    Physical Exam Constitutional:      Appearance: Normal appearance. She is well-developed.  HENT:     Head: Normocephalic and atraumatic.  Cardiovascular:     Rate and Rhythm: Normal rate and regular rhythm.     Heart sounds: Normal heart sounds.  Pulmonary:     Effort: Pulmonary effort is normal.     Breath sounds: Normal breath sounds.  Musculoskeletal:     Cervical back: Neck supple. No rigidity or tenderness.  Skin:    General: Skin is warm and dry.  Neurological:     Mental Status: She is alert and oriented to person, place, and time.  Psychiatric:  Behavior: Behavior normal.    BP (!) 122/56   Pulse (!) 57   Ht '5\' 4"'$  (1.626 m)   Wt 158 lb (71.7 kg)   SpO2 99%   BMI 27.12 kg/m  Wt Readings from Last 3 Encounters:  02/28/21 158 lb (71.7 kg)  02/06/21  149 lb (67.6 kg)  01/08/21 156 lb 1.9 oz (70.8 kg)     Health Maintenance Due  Topic Date Due   Zoster Vaccines- Shingrix (1 of 2) Never done    There are no preventive care reminders to display for this patient.  Lab Results  Component Value Date   TSH 1.38 09/03/2019   Lab Results  Component Value Date   WBC 5.9 02/06/2021   HGB 13.0 02/06/2021   HCT 37.9 02/06/2021   MCV 87.9 02/06/2021   PLT 291 02/06/2021   Lab Results  Component Value Date   NA 135 02/06/2021   K 4.2 02/06/2021   CO2 26 02/06/2021   GLUCOSE 114 (H) 02/06/2021   BUN 13 02/06/2021   CREATININE 0.94 02/06/2021   BILITOT 0.3 02/06/2021   ALKPHOS 59 02/06/2021   AST 29 02/06/2021   ALT 22 02/06/2021   PROT 8.0 02/06/2021   ALBUMIN 4.4 02/06/2021   CALCIUM 8.5 (L) 02/06/2021   ANIONGAP 9 02/06/2021   Lab Results  Component Value Date   CHOL 122 02/29/2020   Lab Results  Component Value Date   HDL 47 (L) 02/29/2020   Lab Results  Component Value Date   LDLCALC 52 02/29/2020   Lab Results  Component Value Date   TRIG 145 02/29/2020   Lab Results  Component Value Date   CHOLHDL 2.6 02/29/2020   No results found for: HGBA1C    Assessment & Plan:   Problem List Items Addressed This Visit       Cardiovascular and Mediastinum   Hypertension - Primary (Chronic)    Well controlled. Continue current regimen. Follow up in  77mo.         Genitourinary   CKD (chronic kidney disease) stage 3, GFR 30-59 ml/min (HCC)    Stable.  Last Cr was 0.9 in August.          Other   Insomnia    She is doing well with the Ambien she feels like it is effective she does not take it every night she says usually if she has a couple nights in a row where she sleeps really poorly then she will take it to get a good night sleep.  She denies any side effects.      Other Visit Diagnoses     Need for immunization against influenza       Relevant Orders   Flu Vaccine QUAD High Dose(Fluad)  (Completed)       Flu vaccine given today.  No orders of the defined types were placed in this encounter.   Follow-up: Return in about 6 months (around 08/28/2021) for Hypertension.   I spent 32 minutes on the day of the encounter to include pre-visit record review, face-to-face time with the patient and post visit ordering of test.   CBeatrice Lecher MD

## 2021-02-28 NOTE — Assessment & Plan Note (Signed)
Stable.  Last Cr was 0.9 in August.

## 2021-02-28 NOTE — Assessment & Plan Note (Signed)
Well controlled. Continue current regimen. Follow up in  6 mo  

## 2021-02-28 NOTE — Assessment & Plan Note (Signed)
She is doing well with the Ambien she feels like it is effective she does not take it every night she says usually if she has a couple nights in a row where she sleeps really poorly then she will take it to get a good night sleep.  She denies any side effects.

## 2021-03-04 ENCOUNTER — Ambulatory Visit
Admission: RE | Admit: 2021-03-04 | Discharge: 2021-03-04 | Disposition: A | Discharge status: Home / Self Care | Payer: Medicare Other | Source: Ambulatory Visit | Attending: Neurosurgery | Admitting: Neurosurgery

## 2021-03-04 DIAGNOSIS — M542 Cervicalgia: Secondary | ICD-10-CM | POA: Diagnosis not present

## 2021-03-04 DIAGNOSIS — M5412 Radiculopathy, cervical region: Secondary | ICD-10-CM

## 2021-03-20 DIAGNOSIS — M79602 Pain in left arm: Secondary | ICD-10-CM | POA: Diagnosis not present

## 2021-03-20 DIAGNOSIS — R200 Anesthesia of skin: Secondary | ICD-10-CM | POA: Diagnosis not present

## 2021-03-20 DIAGNOSIS — R202 Paresthesia of skin: Secondary | ICD-10-CM | POA: Diagnosis not present

## 2021-03-20 DIAGNOSIS — M79601 Pain in right arm: Secondary | ICD-10-CM | POA: Diagnosis not present

## 2021-03-23 ENCOUNTER — Encounter: Payer: Self-pay | Admitting: Family Medicine

## 2021-03-26 DIAGNOSIS — G478 Other sleep disorders: Secondary | ICD-10-CM | POA: Diagnosis not present

## 2021-03-26 DIAGNOSIS — Z6826 Body mass index (BMI) 26.0-26.9, adult: Secondary | ICD-10-CM | POA: Diagnosis not present

## 2021-03-26 DIAGNOSIS — M542 Cervicalgia: Secondary | ICD-10-CM | POA: Diagnosis not present

## 2021-03-26 DIAGNOSIS — M5412 Radiculopathy, cervical region: Secondary | ICD-10-CM | POA: Diagnosis not present

## 2021-03-26 DIAGNOSIS — I10 Essential (primary) hypertension: Secondary | ICD-10-CM | POA: Diagnosis not present

## 2021-04-17 ENCOUNTER — Other Ambulatory Visit: Payer: Self-pay | Admitting: Family Medicine

## 2021-04-20 ENCOUNTER — Other Ambulatory Visit: Payer: Self-pay

## 2021-04-20 DIAGNOSIS — I771 Stricture of artery: Secondary | ICD-10-CM

## 2021-05-03 ENCOUNTER — Ambulatory Visit: Payer: Medicare Other | Admitting: Vascular Surgery

## 2021-05-03 ENCOUNTER — Inpatient Hospital Stay (HOSPITAL_COMMUNITY): Admission: RE | Admit: 2021-05-03 | Payer: Medicare Other | Source: Ambulatory Visit

## 2021-05-05 DIAGNOSIS — Z20822 Contact with and (suspected) exposure to covid-19: Secondary | ICD-10-CM | POA: Diagnosis not present

## 2021-05-14 ENCOUNTER — Ambulatory Visit (INDEPENDENT_AMBULATORY_CARE_PROVIDER_SITE_OTHER): Payer: Medicare Other | Admitting: Family Medicine

## 2021-05-14 ENCOUNTER — Encounter: Payer: Self-pay | Admitting: Family Medicine

## 2021-05-14 ENCOUNTER — Other Ambulatory Visit: Payer: Self-pay

## 2021-05-14 VITALS — BP 139/76 | HR 64 | Temp 98.5°F | Resp 17

## 2021-05-14 DIAGNOSIS — R300 Dysuria: Secondary | ICD-10-CM | POA: Diagnosis not present

## 2021-05-14 DIAGNOSIS — I708 Atherosclerosis of other arteries: Secondary | ICD-10-CM

## 2021-05-14 LAB — POCT URINALYSIS DIP (CLINITEK)
Bilirubin, UA: NEGATIVE
Glucose, UA: NEGATIVE mg/dL
Ketones, POC UA: NEGATIVE mg/dL
Nitrite, UA: NEGATIVE
POC PROTEIN,UA: NEGATIVE
Spec Grav, UA: 1.015 (ref 1.010–1.025)
Urobilinogen, UA: 0.2 E.U./dL
pH, UA: 5.5 (ref 5.0–8.0)

## 2021-05-14 LAB — WET PREP FOR TRICH, YEAST, CLUE
MICRO NUMBER:: 12683492
Specimen Quality: ADEQUATE

## 2021-05-14 MED ORDER — CEPHALEXIN 500 MG PO CAPS: 500.0000 mg | ORAL_CAPSULE | Freq: Two times a day (BID) | ORAL | 0 refills | Status: DC

## 2021-05-14 NOTE — Progress Notes (Signed)
Acute Office Visit  Subjective:    Patient ID: Traci [name], female    DOB: 09-30-1945, 75 y.o.   MRN: [id number]  Chief Complaint  Patient presents with   Urinary Tract Infection    Urinary Tract Infection  The current episode started in the past 7 days. The problem occurs every urination. The problem has been unchanged. The quality of the pain is described as burning. The pain is at a severity of 3/10. The pain is mild. There has been no fever. She is Not sexually active. Associated symptoms include frequency and urgency. Pertinent negatives include no chills, discharge, hematuria, hesitancy, nausea, possible pregnancy, sweats or vomiting. Associated symptoms comments: Malodorous urine. She has tried increased fluids (cranberry juice) for the symptoms. The treatment provided no relief.  Reports urine is cloudy, seems to be darker in the morning. About a week ago she felt like she might have a yeast infection so she did an OTC treatment but it did not make a difference - only symptom was the generalized burning, only with urination though. She would like to rule out yeast/BV.   Past Medical History:  Diagnosis Date   Anxiety    Atrial fibrillation ([place])    CAD (coronary artery disease)    Claudication ([place]) 01/06/2008   Lower extremity dopplers - no evidence of arterial insufficiency, normal exam   Coronary artery disease    GERD (gastroesophageal reflux disease)    Hyperlipidemia    Hypertension 11/30/2010   echo- EF 55%; normal w/ mildly sclerotic aortic valve   Hypertension 11/05/11   renal dopplers - celiac artery and SMA >50% diameter reduction, R renal artery - mildly elevated velocities 1-59% diameter reduction, L renal artery normal   Nonspecific ST-T wave electrocardiographic changes 03/26/2011   R/Lmv - EF 74%, normal perfusion all regions, ST depression w/ Lexiscan infusion w/o assoc angina   Osteopenia    PAD (peripheral artery disease) (HCC)    Peripheral  vascular disease (HCC)    PVD (peripheral vascular disease) ([place]) 11/05/2011   doppler - R/L brachial pressures essentially equal w/o inflow disease; L sublclavian/CCA bypass graft demonstrates patent flow, no evidence of significant stenosis   Sigmoid diverticulitis     Past Surgical History:  Procedure Laterality Date   ABDOMINAL AORTIC ANEURYSM REPAIR N/A 01/27/2014   Procedure: AORTO-SUPERIOR MESENTERIC ARTERY BYPASS GRAFT;  Surgeon: Angelia Mould, MD;  Location: [facility];  Service: Vascular;  Laterality: N/A;   ABDOMINAL HYSTERECTOMY     APPENDECTOMY     CARDIAC CATHETERIZATION  06/03/2008   60% LAD, involving D2, borderline significant by IVUS, medical therapy. CFX, RCA OK.   CATARACT EXTRACTION     CHOLECYSTECTOMY     EYE SURGERY     retinal surgery   SPINE SURGERY  Feb. 2011   [place]   X's  several   SUBCLAVIAN ARTERY STENT Left 09/20/2008   LSA ISR 7x77mm Cordis Genesis on Opta premount, reduction from 80% to 0%   subclavian artery stents  01/23/2010   Left carotid to subclavian artery Bypass   VISCERAL ANGIOGRAM  01/17/2014   Procedure: VISCERAL ANGIOGRAM;  Surgeon: Angelia Mould, MD;  Location: [facility];  Service: Cardiovascular;;    Family History  Problem Relation Age of Onset   Heart disease Father 34       Heart Disease before age 50   Kidney disease Father    Heart attack Father    Hyperlipidemia Father  Hypertension Father    Kidney failure Mother    Diabetes Maternal Grandmother    Heart disease Paternal Grandfather     Social History   Socioeconomic History   Marital status: Widowed    Spouse name: Not on file   Number of children: 1   Years of education: 16   Highest education level: Bachelor's degree (e.g., BA, AB, BS)  Occupational History   Occupation: retired    Comment: Nurse  Tobacco Use   Smoking status: Former    Packs/day: 0.25    Years: 20.00    Pack years: 5.00    Types: Cigarettes    Quit date:  09/30/1993    Years since quitting: 27.6   Smokeless tobacco: Never  Vaping Use   Vaping Use: Never used  Substance and Sexual Activity   Alcohol use: No   Drug use: No   Sexual activity: Not Currently  Other Topics Concern   Not on file  Social History Narrative   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 No caffeine use. Likes to go dancing once a week. Likes play online games such as scrabble.    Social Determinants of Health   Financial Resource Strain: Low Risk    Difficulty of Paying Living Expenses: Not hard at all  Food Insecurity: No Food Insecurity   Worried About Charity fundraiser in the Last Year: Never true   [place] in the Last Year: Never true  Transportation Needs: No Transportation Needs   Lack of Transportation (Medical): No   Lack of Transportation (Non-Medical): No  Physical Activity: Inactive   Days of Exercise per Week: 0 days   Minutes of Exercise per Session: 0 min  Stress: No Stress Concern Present   Feeling of Stress : Not at all  Social Connections: Moderately Isolated   Frequency of Communication with Friends and Family: More than three times a week   Frequency of Social Gatherings with Friends and Family: More than three times a week   Attends Religious Services: Never   Marine scientist or Organizations: Yes   Attends Music therapist: More than 4 times per year   Marital Status: Widowed  Human resources officer Violence: Not At Risk   Fear of Current or Ex-Partner: No   Emotionally Abused: No   Physically Abused: No   Sexually Abused: No    Outpatient Medications Prior to Visit  Medication Sig Dispense Refill   acetaminophen (TYLENOL) 325 MG tablet Take 325 mg by mouth every 6 (six) hours as needed for moderate pain or headache.     aspirin EC 81 MG tablet Take 81 mg by mouth daily.     atenolol (TENORMIN) 25 MG tablet TAKE 1 TABLET DAILY 90 tablet 3   atorvastatin (LIPITOR) 10 MG tablet TAKE 1 TABLET  DAILY AT 6 P.M. 90 tablet 3   candesartan (ATACAND) 16 MG tablet TAKE ONE-HALF (1/2) TABLET DAILY 45 tablet 3   cholecalciferol (VITAMIN D) 1000 UNITS tablet Take 1,000 Units by mouth daily.     clopidogrel (PLAVIX) 75 MG tablet TAKE 1 TABLET DAILY 90 tablet 3   clorazepate (TRANXENE) 7.5 MG tablet TAKE 1 TABLET DAILY AS NEEDED 90 tablet 0   cyanocobalamin 1000 MCG tablet Take 1,000 mcg by mouth daily.     EPINEPHrine (EPIPEN 2-PAK) 0.3 mg/0.3 mL IJ SOAJ injection Inject 0.3 mLs (0.3 mg total) into the muscle as needed (for allergic reaction). 2 Device 0  esomeprazole (NEXIUM) 40 MG capsule Take 1 capsule (40 mg total) by mouth daily. Take 1 tab daily 90 capsule 0   estradiol (ESTRACE) 0.5 MG tablet TAKE 1 TABLET DAILY 90 tablet 3   furosemide (LASIX) 20 MG tablet TAKE 1 TABLET EVERY OTHER DAY 45 tablet 3   linaclotide (LINZESS) 145 MCG CAPS capsule Take 145 mcg by mouth 2 (two) times a week. Sunday and wednesday     meclizine (ANTIVERT) 25 MG tablet TAKE ONE-HALF (1/2) TABLET THREE TIMES A DAY AS NEEDED FOR DIZZINESS OR NAUSEA 60 tablet 8   Multiple Vitamin (MULTIVITAMIN WITH MINERALS) TABS Take 1 tablet by mouth daily.     zolpidem (AMBIEN) 5 MG tablet Take 1 tablet (5 mg total) by mouth at bedtime as needed for sleep. 90 tablet 1   No facility-administered medications prior to visit.    Allergies  Allergen Reactions   Cortisone Anaphylaxis   Dilaudid [Hydromorphone Hcl] Nausea And Vomiting    Pt states she will start vomiting immediately for 6 hours   Iodine Anaphylaxis   Medrol [Methylprednisolone] Anaphylaxis   Omnipaque [Iohexol] Anaphylaxis   Prednisone Anaphylaxis and Swelling   Shellfish Allergy Anaphylaxis   Sulfa Drugs Cross Reactors Anaphylaxis   Z-Pak [Azithromycin] Swelling   Doxycycline     Thrush/itching   Ivp Dye [Iodinated Diagnostic Agents]     Other reaction(s): Unknown   Methylprednisolone Sodium Succ Swelling    Blood pressure drops immediately   Sulfa  Antibiotics Other (See Comments)    Stomach upset   Augmentin [Amoxicillin-Pot Clavulanate] Nausea Only    Upset stomach (Has taken cephalexin in the past without problems)   Codeine Rash and Other (See Comments)    Upset stomach   Erythromycin Rash   Morphine And Related Nausea Only    nausea    Review of Systems: All review of systems negative except what is listed in the HPI      Objective:    Physical Exam Vitals reviewed.  Constitutional:      Appearance: Normal appearance.  Abdominal:     General: Abdomen is flat. Bowel sounds are normal. There is no distension.     Palpations: Abdomen is soft. There is no mass.     Tenderness: There is no abdominal tenderness. There is no right CVA tenderness, left CVA tenderness, guarding or rebound.     Hernia: No hernia is present.  Neurological:     General: No focal deficit present.     Mental Status: She is alert and oriented to person, place, and time. Mental status is at baseline.  Psychiatric:        Mood and Affect: Mood normal.        Behavior: Behavior normal.        Thought Content: Thought content normal.        Judgment: Judgment normal.    BP 139/76   Pulse 64   Temp 98.5 F (36.9 C)   Resp 17   SpO2 96%  Wt Readings from Last 3 Encounters:  02/28/21 158 lb (71.7 kg)  02/06/21 149 lb (67.6 kg)  01/08/21 156 lb 1.9 oz (70.8 kg)    Health Maintenance Due  Topic Date Due   COVID-19 Vaccine (5 - Booster for Pfizer series) 02/24/2021    There are no preventive care reminders to display for this patient.   Lab Results  Component Value Date   TSH 1.38 09/03/2019   Lab Results  Component Value Date  WBC 5.9 02/06/2021   HGB 13.0 02/06/2021   HCT 37.9 02/06/2021   MCV 87.9 02/06/2021   PLT 291 02/06/2021   Lab Results  Component Value Date   NA 135 02/06/2021   K 4.2 02/06/2021   CO2 26 02/06/2021   GLUCOSE 114 (H) 02/06/2021   BUN 13 02/06/2021   CREATININE 0.94 02/06/2021   BILITOT 0.3  02/06/2021   ALKPHOS 59 02/06/2021   AST 29 02/06/2021   ALT 22 02/06/2021   PROT 8.0 02/06/2021   ALBUMIN 4.4 02/06/2021   CALCIUM 8.5 (L) 02/06/2021   ANIONGAP 9 02/06/2021   Lab Results  Component Value Date   CHOL 122 02/29/2020   Lab Results  Component Value Date   HDL 47 (L) 02/29/2020   Lab Results  Component Value Date   LDLCALC 52 02/29/2020   Lab Results  Component Value Date   TRIG 145 02/29/2020   Lab Results  Component Value Date   CHOLHDL 2.6 02/29/2020   No results found for: HGBA1C     Assessment & Plan:    1. Dysuria UA in office today: small leuks and trace-lysed blood. Will send for culture.  Sending off wet prep for yeast/BV. Discussed options with patient - given the severity of her symptoms, she would like to go ahead and start some Keflex. We will let her know if we need to make any changes based on the culture. Encouraged her to stay hydrated, avoid bladder irritants and continue good hygiene.   - POCT URINALYSIS DIP (CLINITEK) - WET PREP FOR TRICH, YEAST, CLUE   Follow-up if symptoms worsen or fail to improve. Patient aware of signs/symptoms requiring further/urgent evaluation.    Terrilyn Saver, NP

## 2021-05-18 ENCOUNTER — Encounter: Payer: Self-pay | Admitting: Family Medicine

## 2021-05-18 DIAGNOSIS — N3000 Acute cystitis without hematuria: Secondary | ICD-10-CM

## 2021-05-18 LAB — WET PREP FOR TRICH, YEAST, CLUE

## 2021-05-18 LAB — URINE CULTURE
MICRO NUMBER:: 12692363
SPECIMEN QUALITY:: ADEQUATE

## 2021-05-18 MED ORDER — NITROFURANTOIN MONOHYD MACRO 100 MG PO CAPS: 100.0000 mg | ORAL_CAPSULE | Freq: Two times a day (BID) | ORAL | 0 refills | Status: DC

## 2021-05-18 MED ORDER — PHENAZOPYRIDINE HCL 200 MG PO TABS: 200.0000 mg | ORAL_TABLET | Freq: Three times a day (TID) | ORAL | 0 refills | Status: AC

## 2021-05-24 ENCOUNTER — Encounter: Payer: Self-pay | Admitting: Family Medicine

## 2021-05-31 ENCOUNTER — Ambulatory Visit (HOSPITAL_COMMUNITY)
Admission: RE | Admit: 2021-05-31 | Discharge: 2021-05-31 | Disposition: A | Discharge status: Home / Self Care | Payer: Medicare Other | Source: Ambulatory Visit | Attending: Vascular Surgery | Admitting: Vascular Surgery

## 2021-05-31 ENCOUNTER — Encounter: Payer: Self-pay | Admitting: Vascular Surgery

## 2021-05-31 ENCOUNTER — Other Ambulatory Visit: Payer: Self-pay

## 2021-05-31 ENCOUNTER — Ambulatory Visit (INDEPENDENT_AMBULATORY_CARE_PROVIDER_SITE_OTHER): Payer: Medicare Other | Admitting: Vascular Surgery

## 2021-05-31 VITALS — BP 148/78 | Temp 98.0°F | Resp 20 | Ht 64.0 in | Wt 158.0 lb

## 2021-05-31 DIAGNOSIS — I708 Atherosclerosis of other arteries: Secondary | ICD-10-CM

## 2021-05-31 DIAGNOSIS — I771 Stricture of artery: Secondary | ICD-10-CM

## 2021-05-31 NOTE — Progress Notes (Signed)
REASON FOR VISIT:   Follow-up of peripheral arterial disease.  MEDICAL ISSUES:   S/P AORTO SMA BYPASS: The patient has had no postprandial abdominal pain or weight loss.  We have been checking her bypass every other year so I have ordered a follow-up duplex scan in 1 year and I will see her back at that time.  I have encouraged her to stay as active as possible.  She does occasionally have some bloating but I do not think she has any evidence of mesenteric ischemia.  She is on aspirin, Plavix, and a statin.  S/P LEFT CAROTID SUBCLAVIAN BYPASS: Her left carotid subclavian bypass graft is patent.  She has a palpable left radial pulse.  She is asymptomatic.  When she comes back next year we will get a carotid duplex scan in a year.  Thus I do not think she needs routine yearly studies.   HPI:   Traci [name] is a pleasant 75 y.o. female who is well-known to me.  She is undergone a previous left carotid subclavian bypass.  She is also had an aorto SMA bypass in 2015.  She comes in for a yearly follow-up visit.   Since I saw her last, she denies any history of stroke, TIAs, expressive or receptive aphasia, or amaurosis fugax.  She is had no upper extremity symptoms.  She does occasionally get some abdominal bloating but denies any postprandial abdominal pain or weight loss.  I do not get any history of claudication.  She does get cramps in her legs and also some swelling in her legs.  She has no previous history of DVT.  Past Medical History:  Diagnosis Date   Anxiety    Atrial fibrillation (HCC)    CAD (coronary artery disease)    Claudication ([place]) 01/06/2008   Lower extremity dopplers - no evidence of arterial insufficiency, normal exam   Coronary artery disease    GERD (gastroesophageal reflux disease)    Hyperlipidemia    Hypertension 11/30/2010   echo- EF 55%; normal w/ mildly sclerotic aortic valve   Hypertension 11/05/11   renal dopplers - celiac artery and SMA  >50% diameter reduction, R renal artery - mildly elevated velocities 1-59% diameter reduction, L renal artery normal   Nonspecific ST-T wave electrocardiographic changes 03/26/2011   R/Lmv - EF 74%, normal perfusion all regions, ST depression w/ Lexiscan infusion w/o assoc angina   Osteopenia    PAD (peripheral artery disease) (HCC)    Peripheral vascular disease (HCC)    PVD (peripheral vascular disease) ([place]) 11/05/2011   doppler - R/L brachial pressures essentially equal w/o inflow disease; L sublclavian/CCA bypass graft demonstrates patent flow, no evidence of significant stenosis   Sigmoid diverticulitis     Family History  Problem Relation Age of Onset   Heart disease Father 76       Heart Disease before age 51   Kidney disease Father    Heart attack Father    Hyperlipidemia Father    Hypertension Father    Kidney failure Mother    Diabetes Maternal Grandmother    Heart disease Paternal Grandfather     SOCIAL HISTORY: Social History   Tobacco Use   Smoking status: Former    Packs/day: 0.25    Years: 20.00    Pack years: 5.00    Types: Cigarettes    Quit date: 09/30/1993    Years since quitting: 27.6   Smokeless tobacco: Never  Substance Use Topics   Alcohol  use: No    Allergies  Allergen Reactions   Cortisone Anaphylaxis   Dilaudid [Hydromorphone Hcl] Nausea And Vomiting    Pt states she will start vomiting immediately for 6 hours   Iodine Anaphylaxis   Medrol [Methylprednisolone] Anaphylaxis   Omnipaque [Iohexol] Anaphylaxis   Prednisone Anaphylaxis and Swelling   Shellfish Allergy Anaphylaxis   Sulfa Drugs Cross Reactors Anaphylaxis   Z-Pak [Azithromycin] Swelling   Doxycycline     Thrush/itching   Ivp Dye [Iodinated Diagnostic Agents]     Other reaction(s): Unknown   Methylprednisolone Sodium Succ Swelling    Blood pressure drops immediately   Sulfa Antibiotics Other (See Comments)    Stomach upset   Augmentin [Amoxicillin-Pot Clavulanate] Nausea Only     Upset stomach (Has taken cephalexin in the past without problems)   Codeine Rash and Other (See Comments)    Upset stomach   Erythromycin Rash   Morphine And Related Nausea Only    nausea    Current Outpatient Medications  Medication Sig Dispense Refill   aspirin EC 81 MG tablet Take 81 mg by mouth daily.     atenolol (TENORMIN) 25 MG tablet TAKE 1 TABLET DAILY 90 tablet 3   atorvastatin (LIPITOR) 10 MG tablet TAKE 1 TABLET DAILY AT 6 P.M. 90 tablet 3   candesartan (ATACAND) 16 MG tablet TAKE ONE-HALF (1/2) TABLET DAILY 45 tablet 3   cholecalciferol (VITAMIN D) 1000 UNITS tablet Take 1,000 Units by mouth daily.     clopidogrel (PLAVIX) 75 MG tablet TAKE 1 TABLET DAILY 90 tablet 3   clorazepate (TRANXENE) 7.5 MG tablet TAKE 1 TABLET DAILY AS NEEDED 90 tablet 0   cyanocobalamin 1000 MCG tablet Take 1,000 mcg by mouth daily.     EPINEPHrine (EPIPEN 2-PAK) 0.3 mg/0.3 mL IJ SOAJ injection Inject 0.3 mLs (0.3 mg total) into the muscle as needed (for allergic reaction). 2 Device 0   esomeprazole (NEXIUM) 40 MG capsule Take 1 capsule (40 mg total) by mouth daily. Take 1 tab daily 90 capsule 0   estradiol (ESTRACE) 0.5 MG tablet TAKE 1 TABLET DAILY 90 tablet 3   furosemide (LASIX) 20 MG tablet TAKE 1 TABLET EVERY OTHER DAY 45 tablet 3   linaclotide (LINZESS) 145 MCG CAPS capsule Take 145 mcg by mouth 2 (two) times a week. Sunday and wednesday     meclizine (ANTIVERT) 25 MG tablet TAKE ONE-HALF (1/2) TABLET THREE TIMES A DAY AS NEEDED FOR DIZZINESS OR NAUSEA 60 tablet 8   Multiple Vitamin (MULTIVITAMIN WITH MINERALS) TABS Take 1 tablet by mouth daily.     zolpidem (AMBIEN) 5 MG tablet Take 1 tablet (5 mg total) by mouth at bedtime as needed for sleep. 90 tablet 1   No current facility-administered medications for this visit.    REVIEW OF SYSTEMS:  [X]  denotes positive finding, [ ]  denotes negative finding Cardiac  Comments:  Chest pain or chest pressure:    Shortness of breath upon  exertion:    Short of breath when lying flat: x   Irregular heart rhythm: x       Vascular    Pain in calf, thigh, or hip brought on by ambulation: x   Pain in feet at night that wakes you up from your sleep:     Blood clot in your veins:    Leg swelling:  x       Pulmonary    Oxygen at home:    Productive cough:     Wheezing:  Neurologic    Sudden weakness in arms or legs:     Sudden numbness in arms or legs:     Sudden onset of difficulty speaking or slurred speech:    Temporary loss of vision in one eye:     Problems with dizziness:         Gastrointestinal    Blood in stool:     Vomited blood:         Genitourinary    Burning when urinating:     Blood in urine:        Psychiatric    Major depression:         Hematologic    Bleeding problems:    Problems with blood clotting too easily:        Skin    Rashes or ulcers:        Constitutional    Fever or chills:     PHYSICAL EXAM:   Vitals:   05/31/21 1548  BP: (!) 142/84  Resp: 20  Temp: 98 F (36.7 C)  Weight: 158 lb (71.7 kg)  Height: 5\' 4"  (1.626 m)    GENERAL: The patient is a well-nourished female, in no acute distress. The vital signs are documented above. CARDIAC: There is a regular rate and rhythm.  VASCULAR: She has bilateral carotid bruits. She has palpable femoral and posterior tibial pulses bilaterally. PULMONARY: There is good air exchange bilaterally without wheezing or rales. ABDOMEN: Soft and non-tender with normal pitched bowel sounds.  MUSCULOSKELETAL: There are no major deformities or cyanosis. NEUROLOGIC: No focal weakness or paresthesias are detected. SKIN: There are no ulcers or rashes noted. PSYCHIATRIC: The patient has a normal affect.  DATA:    CAROTID DUPLEX: I have independently interpreted the patient's carotid duplex scan today.  On the right side there is a less than 39% stenosis.  The right vertebral artery is patent with antegrade flow.  There is normal flow  dynamics in the right subclavian artery.  On the left side there is a less than 39% stenosis.  The left vertebral artery is patent with antegrade flow.  The left carotid subclavian bypass graft is patent.  [name] [facility] [phone]

## 2021-06-20 ENCOUNTER — Telehealth: Payer: Self-pay | Admitting: Cardiovascular Disease

## 2021-06-20 NOTE — Telephone Encounter (Signed)
Spoke with patient who reports over the past 2-3 weeks she has had palpitations on and off and gets foggy sometimes. She also reports her left arm gets numb at times. Her blood pressure this morning was 138/52, P 50. Sometimes, it will be 178/87. Recommended that if her symptoms get worse, she should go to the emergency room. Scheduled patient to see Laurann Montana on January 23rd.

## 2021-06-20 NOTE — Telephone Encounter (Signed)
Patient c/o Palpitations:  High priority if patient c/o lightheadedness, shortness of breath, or chest pain  How long have you had palpitations/irregular HR/ Afib? Are you having the symptoms now? HEART HAS BEEN SKIPPING A BEAT FOR A FEW WEEKS NOW  Are you currently experiencing lightheadedness, SOB or CP? NO PT ONLY HAS A FOGGY FEELING IN HER HEAD THEN IT WILL GO AWAY AND SHE FEELS FINE  Do you have a history of afib (atrial fibrillation) or irregular heart rhythm? NO  Have you checked your BP or HR? (document readings if available): 138/62 PULSE 50 ABOUT AN HOUR AGO  Are you experiencing any other symptoms? PT IS HAVING OTHER SYMPTOMS BUT SHE IS NOT AWARE IF IT IS RELATED TO HER HEART. PT HAS BEEN HAVING PROBLEMS WITH HER LEFT ARM FOR A FEW WEEKS NOW, SHE SAID WHEN SHE GETS UP IN THE MORNING IT TAKES A WHILE FOR THE BLOOD TO START CIRCULATING BUT ONCE THE BLOOD STARTS CIRCULATING SHE'S FINE.

## 2021-06-22 ENCOUNTER — Encounter: Payer: Self-pay | Admitting: Emergency Medicine

## 2021-06-22 ENCOUNTER — Other Ambulatory Visit: Payer: Self-pay

## 2021-06-22 ENCOUNTER — Emergency Department (INDEPENDENT_AMBULATORY_CARE_PROVIDER_SITE_OTHER)
Admission: EM | Admit: 2021-06-22 | Discharge: 2021-06-22 | Disposition: A | Discharge status: Home / Self Care | Payer: Medicare Other | Source: Home / Self Care

## 2021-06-22 DIAGNOSIS — R1032 Left lower quadrant pain: Secondary | ICD-10-CM

## 2021-06-22 DIAGNOSIS — R109 Unspecified abdominal pain: Secondary | ICD-10-CM | POA: Diagnosis not present

## 2021-06-22 LAB — POCT URINALYSIS DIP (MANUAL ENTRY)
Bilirubin, UA: NEGATIVE
Blood, UA: NEGATIVE
Glucose, UA: NEGATIVE mg/dL
Ketones, POC UA: NEGATIVE mg/dL
Nitrite, UA: NEGATIVE
Protein Ur, POC: NEGATIVE mg/dL
Spec Grav, UA: 1.015 (ref 1.010–1.025)
Urobilinogen, UA: 0.2 E.U./dL
pH, UA: 6 (ref 5.0–8.0)

## 2021-06-22 MED ORDER — LEVOFLOXACIN 750 MG PO TABS: 750.0000 mg | ORAL_TABLET | Freq: Every day | ORAL | 0 refills | Status: AC

## 2021-06-22 NOTE — ED Provider Notes (Signed)
[name]    CSN: [id number] Arrival date & time: 06/22/21  1622      History   Chief Complaint Chief Complaint  Patient presents with   Abdominal Pain    HPI Traci [name] is a 76 y.o. female.   Patient complains of onset of abdominal bloating, loose stools, and left lower quadrant pain/cramps about one week ago.  Because she has a history of diverticulitis and mesenteric ischemia, she began a clear liquid diet.  She felt that she was improving until she ate an egg last night that resulted in increased pain/bloating today, and nausea without vomiting.  She had a relatively normal bowel movement yesterday.  She denies urinary symptoms, and fevers, chills, and sweats. She states that she has an appointment with her gastroenterologist in four days.  The history is provided by the patient.   Past Medical History:  Diagnosis Date   Anxiety    Atrial fibrillation ([place])    CAD (coronary artery disease)    Claudication ([place]) 01/06/2008   Lower extremity dopplers - no evidence of arterial insufficiency, normal exam   Coronary artery disease    GERD (gastroesophageal reflux disease)    Hyperlipidemia    Hypertension 11/30/2010   echo- EF 55%; normal w/ mildly sclerotic aortic valve   Hypertension 11/05/11   renal dopplers - celiac artery and SMA >50% diameter reduction, R renal artery - mildly elevated velocities 1-59% diameter reduction, L renal artery normal   Nonspecific ST-T wave electrocardiographic changes 03/26/2011   R/Lmv - EF 74%, normal perfusion all regions, ST depression w/ Lexiscan infusion w/o assoc angina   Osteopenia    PAD (peripheral artery disease) (HCC)    Peripheral vascular disease (HCC)    PVD (peripheral vascular disease) ([place]) 11/05/2011   doppler - R/L brachial pressures essentially equal w/o inflow disease; L sublclavian/CCA bypass graft demonstrates patent flow, no evidence of significant stenosis   Sigmoid diverticulitis      Patient Active Problem List   Diagnosis Date Noted   Sprain of anterior talofibular ligament of left ankle 09/28/2020   Edema 09/03/2019   Epigastric pain 08/11/2019   Insomnia 08/10/2019   Hypocalcemia 06/03/2019   Atherosclerosis of aorta ([place]) 01/04/2018   Osteoporosis 12/25/2017   CKD (chronic kidney disease) stage 3, GFR 30-59 ml/min (HCC) 08/04/2017   Primary osteoarthritis of first carpometacarpal joint of left hand 07/10/2017   Strain of right Achilles tendon 04/14/2017   Atypical nevus 08/22/2016   Acute bilateral low back pain with left-sided sciatica 06/06/2016   Menopause 12/08/2015   History of colonoscopy 07/17/2015   Irregular heartbeat 07/26/2014   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[place]) 01/16/2014   Irritable bowel syndrome 09/21/2013   Hyperlipidemia 04/05/2013   Hypertension 12/06/2012   Peripheral arterial disease ([place]) 12/06/2012   CAD, cath 2009 12/06/2012   Hyponatremia, improved holding diuretic 12/06/2012   Lymphocele of left arm 07/14/2012   Atherosclerosis of other specified arteries 04/02/2012   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 CFX, RCA OK.   CATARACT EXTRACTION     CHOLECYSTECTOMY     EYE SURGERY     retinal surgery   SPINE SURGERY  Feb. 2011   [place]   X's  several   SUBCLAVIAN ARTERY STENT Left 09/20/2008   LSA ISR 7x57mm Cordis Genesis on Opta premount, reduction from 80% to 0%   subclavian artery stents   01/23/2010   Left carotid to subclavian artery Bypass   VISCERAL ANGIOGRAM  01/17/2014   Procedure: VISCERAL ANGIOGRAM;  Surgeon: Angelia Mould, MD;  Location: [facility] CATH LAB;  Service: Cardiovascular;;    OB History   No obstetric history on file.      Home Medications    Prior to Admission medications   Medication Sig Start Date End Date Taking? Authorizing Provider  levofloxacin (LEVAQUIN) 750 MG tablet Take 1 tablet (750 mg total) by mouth daily for 7 days. 06/22/21 06/29/21 Yes Kandra Nicolas, MD  aspirin EC 81 MG tablet Take 81 mg by mouth daily.    [provider]  atenolol (TENORMIN) 25 MG tablet TAKE 1 TABLET DAILY 01/29/21   Croitoru, Mihai, MD  atorvastatin (LIPITOR) 10 MG tablet TAKE 1 TABLET DAILY AT 6 P.M. 07/12/20   Croitoru, Mihai, MD  candesartan (ATACAND) 16 MG tablet TAKE ONE-HALF (1/2) TABLET DAILY 07/12/20   Croitoru, Mihai, MD  cholecalciferol (VITAMIN D) 1000 UNITS tablet Take 1,000 Units by mouth daily.    [provider]  clopidogrel (PLAVIX) 75 MG tablet TAKE 1 TABLET DAILY 07/12/20   Croitoru, Mihai, MD  clorazepate (TRANXENE) 7.5 MG tablet TAKE 1 TABLET DAILY AS NEEDED 04/18/21   Hali Marry, MD  cyanocobalamin 1000 MCG tablet Take 1,000 mcg by mouth daily.    [provider]  EPINEPHrine (EPIPEN 2-PAK) 0.3 mg/0.3 mL IJ SOAJ injection Inject 0.3 mLs (0.3 mg total) into the muscle as needed (for allergic reaction). 12/23/17   Gregor Hams, MD  esomeprazole (NEXIUM) 40 MG capsule Take 1 capsule (40 mg total) by mouth daily. Take 1 tab daily 02/23/15   Croitoru, Mihai, MD  estradiol (ESTRACE) 0.5 MG tablet TAKE 1 TABLET DAILY 04/18/21   Hali Marry, MD  furosemide (LASIX) 20 MG tablet TAKE 1 TABLET EVERY OTHER DAY 08/01/20   Croitoru, Dani Gobble, MD  linaclotide (LINZESS) 145 MCG CAPS capsule Take 145 mcg by mouth 2 (two) times a week. Sunday and wednesday 12/22/15   [provider]  meclizine (ANTIVERT) 25 MG tablet TAKE  ONE-HALF (1/2) TABLET THREE TIMES A DAY AS NEEDED FOR DIZZINESS OR NAUSEA 09/04/20   Hali Marry, MD  Multiple Vitamin (MULTIVITAMIN WITH MINERALS) TABS Take 1 tablet by mouth daily.    [provider]  zolpidem (AMBIEN) 5 MG tablet Take 1 tablet (5 mg total) by mouth at bedtime as needed for sleep. 01/05/21   Hali Marry, MD    Family History Family History  Problem Relation Age of Onset   Heart disease Father 81       Heart Disease before age 52   Kidney disease Father    Heart attack Father    Hyperlipidemia Father    Hypertension Father    Kidney failure Mother    Diabetes Maternal Grandmother    Heart disease Paternal Grandfather     Social History Social History   Tobacco Use   Smoking status: Former    Packs/day: 0.25    Years: 20.00  Pack years: 5.00    Types: Cigarettes    Quit date: 09/30/1993    Years since quitting: 27.7   Smokeless tobacco: Never  Vaping Use   Vaping Use: Never used  Substance Use Topics   Alcohol use: No   Drug use: No     Allergies   Cortisone, Dilaudid [hydromorphone hcl], Iodine, Medrol [methylprednisolone], Omnipaque [iohexol], Prednisone, Shellfish allergy, Sulfa drugs cross reactors, Z-pak [azithromycin], Doxycycline, Ivp dye [iodinated contrast media], Methylprednisolone sodium succ, Sulfa antibiotics, Augmentin [amoxicillin-pot clavulanate], Codeine, Erythromycin, and Morphine and related   Review of Systems Review of Systems  Constitutional:  Positive for activity change, appetite change, chills and fatigue. Negative for diaphoresis and fever.  HENT: Negative.    Eyes: Negative.   Respiratory: Negative.    Cardiovascular: Negative.   Gastrointestinal:  Positive for abdominal distention, abdominal pain, diarrhea and nausea. Negative for blood in stool, constipation and vomiting.  Genitourinary: Negative.   Musculoskeletal: Negative.   Skin: Negative.   Neurological:  Negative for headaches.     Physical Exam Triage Vital Signs ED Triage Vitals  Enc Vitals Group     BP 06/22/21 1825 (!) 174/76     Pulse Rate 06/22/21 1825 (!) 58     Resp 06/22/21 1825 17     Temp 06/22/21 1825 98.8 F (37.1 C)     Temp Source 06/22/21 1825 Oral     SpO2 06/22/21 1825 99 %     Weight 06/22/21 1826 152 lb 8 oz (69.2 kg)     Height 06/22/21 1826 5\' 4"  (1.626 m)     Head Circumference --      Peak Flow --      Pain Score 06/22/21 1826 4     Pain Loc --      Pain Edu? --      Excl. in [place]? --    No data found.  Updated Vital Signs BP (!) 174/76 (BP Location: Right Arm)    Pulse (!) 58    Temp 98.8 F (37.1 C) (Oral)    Resp 17    Ht 5\' 4"  (1.626 m)    Wt 69.2 kg    SpO2 99%    BMI 26.18 kg/m   Visual Acuity Right Eye Distance:   Left Eye Distance:   Bilateral Distance:    Right Eye Near:   Left Eye Near:    Bilateral Near:     Physical Exam Vitals and nursing note reviewed.  Constitutional:      General: She is not in acute distress.    Appearance: She is not ill-appearing.  HENT:     Head: Normocephalic.     Mouth/Throat:     Mouth: Mucous membranes are moist.  Eyes:     Pupils: Pupils are equal, round, and reactive to light.  Cardiovascular:     Rate and Rhythm: Regular rhythm. Bradycardia present.     Heart sounds: Normal heart sounds.  Pulmonary:     Breath sounds: Normal breath sounds.  Abdominal:     General: Bowel sounds are normal. There is no distension.     Palpations: Abdomen is soft. There is no hepatomegaly, splenomegaly or mass.     Tenderness: There is abdominal tenderness in the left lower quadrant. There is no right CVA tenderness or left CVA tenderness.     Hernia: There is no hernia in the umbilical area or ventral area.    Musculoskeletal:     Cervical back: Neck supple.  Right lower leg: No edema.     Left lower leg: No edema.  Lymphadenopathy:     Cervical: No cervical adenopathy.  Skin:    General: Skin is warm and dry.   Neurological:     Mental Status: She is alert and oriented to person, place, and time.     UC Treatments / Results  Labs (all labs ordered are listed, but only abnormal results are displayed) Labs Reviewed  POCT URINALYSIS DIP (MANUAL ENTRY) - Abnormal; Notable for the following components:      Result Value   Leukocytes, UA Small (1+) (*)    All other components within normal limits  URINE CULTURE  URINE CULTURE    EKG   Radiology No results found.  Procedures Procedures (including critical care time)  Medications Ordered in UC Medications - No data to display  Initial Impression / Assessment and Plan / UC Course  I have reviewed the triage vital signs and the nursing notes.  Pertinent labs & imaging results that were available during my care of the patient were reviewed by me and considered in my medical decision making (see chart for details).    Suspect recurrent diverticulitis.  Begin Levaquin (patient reports that she cannot tolerate Flagyl or Augmentin and Levaquin has been effective in the past). Because of mild leuks on urinalysis, will send urine culture. Followup with GI as scheduled in 4 days.  Final Clinical Impressions(s) / UC Diagnoses   Final diagnoses:  Abdominal pain, left lower quadrant  Abdominal pain, unspecified abdominal location     Discharge Instructions      Begin clear liquids (apple juice, clear grape juice, Jello, etc) for about 18 to 24 hours.  When improved, advance to a [employer] (Bananas, Rice, Applesauce, Toast). Then gradually resume a regular diet when tolerated.   If symptoms become significantly worse during the night or over the weekend, proceed to the local emergency room.     ED Prescriptions     Medication Sig Dispense Auth. Provider   levofloxacin (LEVAQUIN) 750 MG tablet Take 1 tablet (750 mg total) by mouth daily for 7 days. 7 tablet Kandra Nicolas, MD         Kandra Nicolas, MD 06/23/21 1225

## 2021-06-22 NOTE — Discharge Instructions (Signed)
Begin clear liquids (apple juice, clear grape juice, Jello, etc) for about 18 to 24 hours.  When improved, advance to a [employer] (Bananas, Rice, Applesauce, Toast). Then gradually resume a regular diet when tolerated.   If symptoms become significantly worse during the night or over the weekend, proceed to the local emergency room.

## 2021-06-22 NOTE — ED Triage Notes (Signed)
LLQ pain x 1 week  Bland diet x 1 week  Bloating along w/ LLQ pain  Last BM was yesterday - diarrhea 1 week ago  Decreased PO intake  Unable to get an appt w/ her PCP or GI  Concerns for gastritis vs diverticulitis

## 2021-06-24 LAB — URINE CULTURE
MICRO NUMBER:: 12840408
Result:: NO GROWTH
SPECIMEN QUALITY:: ADEQUATE

## 2021-06-25 DIAGNOSIS — K5792 Diverticulitis of intestine, part unspecified, without perforation or abscess without bleeding: Secondary | ICD-10-CM | POA: Diagnosis not present

## 2021-06-25 DIAGNOSIS — K5904 Chronic idiopathic constipation: Secondary | ICD-10-CM | POA: Diagnosis not present

## 2021-06-26 ENCOUNTER — Institutional Professional Consult (permissible substitution): Payer: Medicare Other | Admitting: Neurology

## 2021-06-27 ENCOUNTER — Ambulatory Visit (INDEPENDENT_AMBULATORY_CARE_PROVIDER_SITE_OTHER): Payer: Medicare Other

## 2021-06-27 ENCOUNTER — Other Ambulatory Visit: Payer: Self-pay

## 2021-06-27 DIAGNOSIS — R002 Palpitations: Secondary | ICD-10-CM

## 2021-06-27 NOTE — Telephone Encounter (Signed)
LMTCB, that I was sending her information in the mail and wanted to discuss it with her.

## 2021-06-27 NOTE — Progress Notes (Unsigned)
Enrolled for [employer] to mail a ZIO XT long term holter monitor to the patients address on file.  

## 2021-06-28 ENCOUNTER — Other Ambulatory Visit: Payer: Self-pay

## 2021-06-28 LAB — URINE CULTURE

## 2021-06-28 NOTE — Telephone Encounter (Signed)
Spoke with patient to go over the ZIO monitor. No questions at this time; she wore one before.

## 2021-06-29 DIAGNOSIS — R1032 Left lower quadrant pain: Secondary | ICD-10-CM | POA: Diagnosis not present

## 2021-06-29 DIAGNOSIS — R002 Palpitations: Secondary | ICD-10-CM | POA: Diagnosis not present

## 2021-06-29 DIAGNOSIS — K5730 Diverticulosis of large intestine without perforation or abscess without bleeding: Secondary | ICD-10-CM | POA: Diagnosis not present

## 2021-06-29 DIAGNOSIS — K449 Diaphragmatic hernia without obstruction or gangrene: Secondary | ICD-10-CM | POA: Diagnosis not present

## 2021-06-29 DIAGNOSIS — Z90710 Acquired absence of both cervix and uterus: Secondary | ICD-10-CM | POA: Diagnosis not present

## 2021-06-29 DIAGNOSIS — Z8719 Personal history of other diseases of the digestive system: Secondary | ICD-10-CM | POA: Diagnosis not present

## 2021-07-02 ENCOUNTER — Ambulatory Visit (INDEPENDENT_AMBULATORY_CARE_PROVIDER_SITE_OTHER): Payer: Medicare Other | Admitting: Family Medicine

## 2021-07-02 ENCOUNTER — Other Ambulatory Visit: Payer: Self-pay

## 2021-07-02 ENCOUNTER — Encounter: Payer: Self-pay | Admitting: Family Medicine

## 2021-07-02 VITALS — BP 131/54 | HR 63 | Resp 16 | Ht 64.0 in | Wt 151.0 lb

## 2021-07-02 DIAGNOSIS — N839 Noninflammatory disorder of ovary, fallopian tube and broad ligament, unspecified: Secondary | ICD-10-CM

## 2021-07-02 DIAGNOSIS — N838 Other noninflammatory disorders of ovary, fallopian tube and broad ligament: Secondary | ICD-10-CM | POA: Diagnosis not present

## 2021-07-02 DIAGNOSIS — D3912 Neoplasm of uncertain behavior of left ovary: Secondary | ICD-10-CM | POA: Diagnosis not present

## 2021-07-02 DIAGNOSIS — R1909 Other intra-abdominal and pelvic swelling, mass and lump: Secondary | ICD-10-CM | POA: Diagnosis not present

## 2021-07-02 NOTE — Progress Notes (Signed)
Established Patient Office Visit  Subjective:  Patient ID: Traci [name], female    DOB: 07-01-1945  Age: 76 y.o. MRN: [id number]  CC:  Chief Complaint  Patient presents with   Follow-up    Discuss MRI results of abdomen and pelvis done at [facility] by [facility]. Patient states they found a lesion on left ovary. Patient would like to discuss next steps.     HPI Traci [name] presents for recent abdominal pain.  She actually went to urgent care for abdominal pain.  She then had an MRI of the abdomen and pelvis with and without contrast on January 13.  MRI revealed d diverticulosis without diverticulitis.  Mild diffuse dilatation of the pancreatic duct without evidence of a pancreatic mass.  And small nonspecific hemorrhagic lesion within the left ovary.  Though the ovary had not changed significantly in size compared to prior CT from September 2021.  She is here today to go over those results.  She also wanted to let me know that she had an episode of sleep paralysis and ever since then she said a few more episodes where she woke up and could not open her eye.  She has an appointment coming up with neurology for consultation she really does not want a big work-up but she does want some reassurance that it is okay.  She just took off her heart monitor today and put it in the mail.  She has a follow-up with cardiology coming up soon  Past Medical History:  Diagnosis Date   Anxiety    Atrial fibrillation ([place])    CAD (coronary artery disease)    Claudication ([place]) 01/06/2008   Lower extremity dopplers - no evidence of arterial insufficiency, normal exam   Coronary artery disease    GERD (gastroesophageal reflux disease)    Hyperlipidemia    Hypertension 11/30/2010   echo- EF 55%; normal w/ mildly sclerotic aortic valve   Hypertension 11/05/11   renal dopplers - celiac artery and SMA >50% diameter reduction, R renal artery - mildly elevated velocities 1-59% diameter  reduction, L renal artery normal   Nonspecific ST-T wave electrocardiographic changes 03/26/2011   R/Lmv - EF 74%, normal perfusion all regions, ST depression w/ Lexiscan infusion w/o assoc angina   Osteopenia    PAD (peripheral artery disease) ([facility])    Peripheral vascular disease ([facility])    PVD (peripheral vascular disease) ([place]) 11/05/2011   doppler - R/L brachial pressures essentially equal w/o inflow disease; L sublclavian/CCA bypass graft demonstrates patent flow, no evidence of significant stenosis   Sigmoid diverticulitis     Past Surgical History:  Procedure Laterality Date   ABDOMINAL AORTIC ANEURYSM REPAIR N/A 01/27/2014   Procedure: AORTO-SUPERIOR MESENTERIC ARTERY BYPASS GRAFT;  Surgeon: Angelia Mould, MD;  Location: [facility];  Service: Vascular;  Laterality: N/A;   ABDOMINAL HYSTERECTOMY     APPENDECTOMY     CARDIAC CATHETERIZATION  06/03/2008   60% LAD, involving D2, borderline significant by IVUS, medical therapy. CFX, RCA OK.   CATARACT EXTRACTION     CHOLECYSTECTOMY     EYE SURGERY     retinal surgery   SPINE SURGERY  Feb. 2011   [place]   X's  several   SUBCLAVIAN ARTERY STENT Left 09/20/2008   LSA ISR 7x73mm Cordis Genesis on Opta premount, reduction from 80% to 0%   subclavian artery stents  01/23/2010   Left carotid to subclavian artery Bypass   VISCERAL ANGIOGRAM  01/17/2014   Procedure: VISCERAL ANGIOGRAM;  Surgeon: Angelia Mould, MD;  Location: [facility] CATH LAB;  Service: Cardiovascular;;    Family History  Problem Relation Age of Onset   Heart disease Father 56       Heart Disease before age 72   Kidney disease Father    Heart attack Father    Hyperlipidemia Father    Hypertension Father    Kidney failure Mother    Diabetes Maternal Grandmother    Heart disease Paternal Grandfather     Social History   Socioeconomic History   Marital status: Widowed    Spouse name: Not on file   Number of children: 1   Years of  education: 16   Highest education level: Bachelor's degree (e.g., BA, AB, BS)  Occupational History   Occupation: retired    Comment: Nurse  Tobacco Use   Smoking status: Former    Packs/day: 0.25    Years: 20.00    Pack years: 5.00    Types: Cigarettes    Quit date: 09/30/1993    Years since quitting: 27.7   Smokeless tobacco: Never  Vaping Use   Vaping Use: Never used  Substance and Sexual Activity   Alcohol use: No   Drug use: No   Sexual activity: Not Currently  Other Topics Concern   Not on file  Social History Narrative   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 No caffeine use. Likes to go dancing once a week. Likes play online games such as scrabble.    Social Determinants of Health   Financial Resource Strain: Not on file  Food Insecurity: Not on file  Transportation Needs: Not on file  Physical Activity: Not on file  Stress: Not on file  Social Connections: Not on file  Intimate Partner Violence: Not on file    Outpatient Medications Prior to Visit  Medication Sig Dispense Refill   aspirin EC 81 MG tablet Take 81 mg by mouth daily.     atenolol (TENORMIN) 25 MG tablet TAKE 1 TABLET DAILY 90 tablet 3   atorvastatin (LIPITOR) 10 MG tablet TAKE 1 TABLET DAILY AT 6 P.M. 90 tablet 3   candesartan (ATACAND) 16 MG tablet TAKE ONE-HALF (1/2) TABLET DAILY 45 tablet 3   cholecalciferol (VITAMIN D) 1000 UNITS tablet Take 1,000 Units by mouth daily.     clopidogrel (PLAVIX) 75 MG tablet TAKE 1 TABLET DAILY 90 tablet 3   clorazepate (TRANXENE) 7.5 MG tablet TAKE 1 TABLET DAILY AS NEEDED 90 tablet 0   cyanocobalamin 1000 MCG tablet Take 1,000 mcg by mouth daily.     EPINEPHrine (EPIPEN 2-PAK) 0.3 mg/0.3 mL IJ SOAJ injection Inject 0.3 mLs (0.3 mg total) into the muscle as needed (for allergic reaction). 2 Device 0   esomeprazole (NEXIUM) 40 MG capsule Take 1 capsule (40 mg total) by mouth daily. Take 1 tab daily 90 capsule 0   estradiol (ESTRACE) 0.5 MG tablet  TAKE 1 TABLET DAILY 90 tablet 3   furosemide (LASIX) 20 MG tablet TAKE 1 TABLET EVERY OTHER DAY 45 tablet 3   linaclotide (LINZESS) 145 MCG CAPS capsule Take 145 mcg by mouth 2 (two) times a week. Sunday and wednesday     meclizine (ANTIVERT) 25 MG tablet TAKE ONE-HALF (1/2) TABLET THREE TIMES A DAY AS NEEDED FOR DIZZINESS OR NAUSEA 60 tablet 8   Multiple Vitamin (MULTIVITAMIN WITH MINERALS) TABS Take 1 tablet by mouth daily.     zolpidem (AMBIEN) 5  MG tablet Take 1 tablet (5 mg total) by mouth at bedtime as needed for sleep. 90 tablet 1   clopidogrel (PLAVIX) 75 MG tablet Take 75 mg by mouth daily.     No facility-administered medications prior to visit.    Allergies  Allergen Reactions   Cortisone Anaphylaxis   Dilaudid [Hydromorphone Hcl] Nausea And Vomiting    Pt states she will start vomiting immediately for 6 hours   Iodine Anaphylaxis   Medrol [Methylprednisolone] Anaphylaxis   Omnipaque [Iohexol] Anaphylaxis   Prednisone Anaphylaxis and Swelling   Shellfish Allergy Anaphylaxis   Sulfa Drugs Cross Reactors Anaphylaxis   Z-Pak [Azithromycin] Swelling   Doxycycline     Thrush/itching   Ivp Dye [Iodinated Contrast Media]     Other reaction(s): Unknown   Methylprednisolone Sodium Succ Swelling    Blood pressure drops immediately   Sulfa Antibiotics Other (See Comments)    Stomach upset   Augmentin [Amoxicillin-Pot Clavulanate] Nausea Only    Upset stomach (Has taken cephalexin in the past without problems)   Codeine Rash and Other (See Comments)    Upset stomach   Erythromycin Rash   Morphine And Related Nausea Only    nausea    ROS Review of Systems    Objective:    Physical Exam  BP (!) 131/54    Pulse 63    Resp 16    Ht 5\' 4"  (1.626 m)    Wt 151 lb (68.5 kg)    SpO2 100%    BMI 25.92 kg/m  Wt Readings from Last 3 Encounters:  07/02/21 151 lb (68.5 kg)  06/22/21 152 lb 8 oz (69.2 kg)  05/31/21 158 lb (71.7 kg)     There are no preventive care reminders  to display for this patient.   There are no preventive care reminders to display for this patient.  Lab Results  Component Value Date   TSH 1.38 09/03/2019   Lab Results  Component Value Date   WBC 5.9 02/06/2021   HGB 13.0 02/06/2021   HCT 37.9 02/06/2021   MCV 87.9 02/06/2021   PLT 291 02/06/2021   Lab Results  Component Value Date   NA 135 02/06/2021   K 4.2 02/06/2021   CO2 26 02/06/2021   GLUCOSE 114 (H) 02/06/2021   BUN 13 02/06/2021   CREATININE 0.94 02/06/2021   BILITOT 0.3 02/06/2021   ALKPHOS 59 02/06/2021   AST 29 02/06/2021   ALT 22 02/06/2021   PROT 8.0 02/06/2021   ALBUMIN 4.4 02/06/2021   CALCIUM 8.5 (L) 02/06/2021   ANIONGAP 9 02/06/2021   Lab Results  Component Value Date   CHOL 122 02/29/2020   Lab Results  Component Value Date   HDL 47 (L) 02/29/2020   Lab Results  Component Value Date   LDLCALC 52 02/29/2020   Lab Results  Component Value Date   TRIG 145 02/29/2020   Lab Results  Component Value Date   CHOLHDL 2.6 02/29/2020   No results found for: HGBA1C    Assessment & Plan:   Problem List Items Addressed This Visit   None Visit Diagnoses     Lesion of left ovary    -  Primary   Relevant Orders   US Pelvic Complete With Transvaginal   CA 125   Mass of left ovary       Relevant Orders   CA 125       Lesion of left ovary.  MRI shows a small T1 hyperintense  lesion within the left ovary measuring approximately 1.3 x 1 cm.  They did not notate the right ovary.  And she has had a hysterectomy.  We discussed options.  I like to start with getting a blood test for Ca1 25 and getting her scheduled for an ultrasound.  Discussed that it will just give [place] a different view of the lesion and we may be able to follow-up better over time with ultrasound.  If we see anything concerning then we can always go from there if we need to refer her to gynecology oncology.  She was also noted to have some scarring in the superior pole on the  right kidney.  Discussed this could have come from previous injury, severe dehydration, hypertension etc.  Continue to monitor renal function.  Also noted to have mild diffuse pancreatic ductal dilatation measuring approximately 5 mm.  She is can a follow-up with GI about that to discuss how to best monitor that area and follow it.  Sleep paralysis-has consultation with neurology coming up.  No orders of the defined types were placed in this encounter.   Follow-up: No follow-ups on file.    Beatrice Lecher, MD

## 2021-07-03 ENCOUNTER — Encounter: Payer: Self-pay | Admitting: Family Medicine

## 2021-07-03 LAB — CA 125: CA 125: 11 U/mL (ref <35)

## 2021-07-03 NOTE — Progress Notes (Signed)
Traci [name], good news.  The tumor marker is normal.  Hopefully they will get you scheduled for the ultrasound in the next couple of weeks.

## 2021-07-04 ENCOUNTER — Ambulatory Visit (INDEPENDENT_AMBULATORY_CARE_PROVIDER_SITE_OTHER): Payer: Medicare Other

## 2021-07-04 ENCOUNTER — Other Ambulatory Visit: Payer: Self-pay

## 2021-07-04 ENCOUNTER — Encounter: Payer: Self-pay | Admitting: Family Medicine

## 2021-07-04 DIAGNOSIS — N839 Noninflammatory disorder of ovary, fallopian tube and broad ligament, unspecified: Secondary | ICD-10-CM | POA: Diagnosis not present

## 2021-07-04 DIAGNOSIS — N83201 Unspecified ovarian cyst, right side: Secondary | ICD-10-CM | POA: Diagnosis not present

## 2021-07-04 DIAGNOSIS — Z90710 Acquired absence of both cervix and uterus: Secondary | ICD-10-CM | POA: Diagnosis not present

## 2021-07-04 DIAGNOSIS — N83202 Unspecified ovarian cyst, left side: Secondary | ICD-10-CM | POA: Diagnosis not present

## 2021-07-04 NOTE — Progress Notes (Signed)
HI Traci [name], good news, no suspicious findings on the [place]. They did see small bilateral ovarian cysts. No mass, etc.  Cysts are a little unusual in post menopausal women so I would like to keep and eye on this and plan to recheck in about 3-6 months.

## 2021-07-06 ENCOUNTER — Other Ambulatory Visit: Payer: Self-pay | Admitting: Cardiovascular Disease

## 2021-07-06 ENCOUNTER — Encounter: Payer: Self-pay | Admitting: Cardiovascular Disease

## 2021-07-06 DIAGNOSIS — R002 Palpitations: Secondary | ICD-10-CM | POA: Diagnosis not present

## 2021-07-09 ENCOUNTER — Ambulatory Visit (HOSPITAL_BASED_OUTPATIENT_CLINIC_OR_DEPARTMENT_OTHER): Payer: Medicare Other | Admitting: Family

## 2021-07-13 DIAGNOSIS — R1010 Upper abdominal pain, unspecified: Secondary | ICD-10-CM | POA: Diagnosis not present

## 2021-07-13 DIAGNOSIS — K8689 Other specified diseases of pancreas: Secondary | ICD-10-CM | POA: Diagnosis not present

## 2021-07-20 ENCOUNTER — Ambulatory Visit (INDEPENDENT_AMBULATORY_CARE_PROVIDER_SITE_OTHER): Payer: Medicare Other | Admitting: Family Medicine

## 2021-07-20 ENCOUNTER — Other Ambulatory Visit: Payer: Self-pay

## 2021-07-20 DIAGNOSIS — Z0000 Encounter for general adult medical examination without abnormal findings: Secondary | ICD-10-CM | POA: Diagnosis not present

## 2021-07-20 DIAGNOSIS — Z20822 Contact with and (suspected) exposure to covid-19: Secondary | ICD-10-CM | POA: Diagnosis not present

## 2021-07-20 NOTE — Patient Instructions (Addendum)
[facility] Maintenance Summary and Written Plan of Care  Traci [name] ,  Thank you for allowing me to perform your Medicare Annual Wellness Visit and for your ongoing commitment to your health.   Health Maintenance & Immunization History Health Maintenance  Topic Date Due   COVID-19 Vaccine (5 - Booster for Pfizer series) 08/05/2021 (Originally 02/24/2021)   COLONOSCOPY (Pts 45-82yrs Insurance coverage will need to be confirmed)  09/28/2021 (Originally 07/16/2020)   DEXA SCAN  04/05/2022   TETANUS/TDAP  03/24/2031   Pneumonia Vaccine 74+ Years old  Completed   INFLUENZA VACCINE  Completed   Hepatitis C Screening  Completed   Zoster Vaccines- Shingrix  Completed   HPV VACCINES  Aged Out   Immunization History  Administered Date(s) Administered   Fluad Quad(high Dose 65+) 03/01/2019, 02/28/2020, 02/28/2021   Influenza, High Dose Seasonal PF 03/25/2018   Influenza, Seasonal, Injecte, Preservative Fre 03/01/2019   Influenza-Unspecified 03/02/2015, 03/15/2016, 02/21/2017   Lyme Disease 03/20/2009   PFIZER(Purple Top)SARS-COV-2 Vaccination 07/22/2019, 08/16/2019, 03/15/2020, 12/30/2020   Pneumococcal Conjugate-13 08/30/2014   Pneumococcal Polysaccharide-23 10/10/2011, 06/17/2012   Tdap 06/17/2010, 04/08/2011, 03/23/2021   Zoster Recombinat (Shingrix) 03/23/2021, 05/24/2021   Zoster, Live 03/22/2012, 06/17/2012    These are the patient goals that we discussed:  Goals Addressed               This Visit's Progress     Patient Stated (pt-stated)        Patient states she would like to start travelling again.         This is a list of Health Maintenance Items that are overdue or due now: Colorectal cancer screening  Orders/Referrals Placed Today: No orders of the defined types were placed in this encounter.  (Contact our referral department at [phone] if you have not spoken with someone about your referral appointment within the  next 5 days)    Follow-up Plan Follow-up with Hali Marry, MD as planned Let us know after you discuss colonoscopy with your GI doctor. Medicare wellness visit in one year. Patient will access AVS on mychart.      Health Maintenance, Female Adopting a healthy lifestyle and getting preventive care are important in promoting health and wellness. Ask your health care provider about: The right schedule for you to have regular tests and exams. Things you can do on your own to prevent diseases and keep yourself healthy. What should I know about diet, weight, and exercise? Eat a healthy diet  Eat a diet that includes plenty of vegetables, fruits, low-fat dairy products, and lean protein. Do not eat a lot of foods that are high in solid fats, added sugars, or sodium. Maintain a healthy weight Body mass index (BMI) is used to identify weight problems. It estimates body fat based on height and weight. Your health care provider can help determine your BMI and help you achieve or maintain a healthy weight. Get regular exercise Get regular exercise. This is one of the most important things you can do for your health. Most adults should: Exercise for at least 150 minutes each week. The exercise should increase your heart rate and make you sweat (moderate-intensity exercise). Do strengthening exercises at least twice a week. This is in addition to the moderate-intensity exercise. Spend less time sitting. Even light physical activity can be beneficial. Watch cholesterol and blood lipids Have your blood tested for lipids and cholesterol at 75 years of age, then have this test every 5 years.  Have your cholesterol levels checked more often if: Your lipid or cholesterol levels are high. You are older than 75 years of age. You are at high risk for heart disease. What should I know about cancer screening? Depending on your health history and family history, you may need to have cancer  screening at various ages. This may include screening for: Breast cancer. Cervical cancer. Colorectal cancer. Skin cancer. Lung cancer. What should I know about heart disease, diabetes, and high blood pressure? Blood pressure and heart disease High blood pressure causes heart disease and increases the risk of stroke. This is more likely to develop in people who have high blood pressure readings or are overweight. Have your blood pressure checked: Every 3-5 years if you are 62-39 years of age. Every year if you are 2 years old or older. Diabetes Have regular diabetes screenings. This checks your fasting blood sugar level. Have the screening done: Once every three years after age 62 if you are at a normal weight and have a low risk for diabetes. More often and at a younger age if you are overweight or have a high risk for diabetes. What should I know about preventing infection? Hepatitis B If you have a higher risk for hepatitis B, you should be screened for this virus. Talk with your health care provider to find out if you are at risk for hepatitis B infection. Hepatitis C Testing is recommended for: Everyone born from [birth year] through 1964. Anyone with known risk factors for hepatitis C. Sexually transmitted infections (STIs) Get screened for STIs, including gonorrhea and chlamydia, if: You are sexually active and are younger than 75 years of age. You are older than 75 years of age and your health care provider tells you that you are at risk for this type of infection. Your sexual activity has changed since you were last screened, and you are at increased risk for chlamydia or gonorrhea. Ask your health care provider if you are at risk. Ask your health care provider about whether you are at high risk for HIV. Your health care provider may recommend a prescription medicine to help prevent HIV infection. If you choose to take medicine to prevent HIV, you should first get tested for HIV. You  should then be tested every 3 months for as long as you are taking the medicine. Pregnancy If you are about to stop having your period (premenopausal) and you may become pregnant, seek counseling before you get pregnant. Take 400 to 800 micrograms (mcg) of folic acid every day if you become pregnant. Ask for birth control (contraception) if you want to prevent pregnancy. Osteoporosis and menopause Osteoporosis is a disease in which the bones lose minerals and strength with aging. This can result in bone fractures. If you are 52 years old or older, or if you are at risk for osteoporosis and fractures, ask your health care provider if you should: Be screened for bone loss. Take a calcium or vitamin D supplement to lower your risk of fractures. Be given hormone replacement therapy (HRT) to treat symptoms of menopause. Follow these instructions at home: Alcohol use Do not drink alcohol if: Your health care provider tells you not to drink. You are pregnant, may be pregnant, or are planning to become pregnant. If you drink alcohol: Limit how much you have to: 0-1 drink a day. Know how much alcohol is in your drink. In the [place] one drink equals one 12 oz bottle of beer (355 mL), one 5  oz glass of wine (148 mL), or one 1 oz glass of hard liquor (44 mL). Lifestyle Do not use any products that contain nicotine or tobacco. These products include cigarettes, chewing tobacco, and vaping devices, such as e-cigarettes. If you need help quitting, ask your health care provider. Do not use street drugs. Do not share needles. Ask your health care provider for help if you need support or information about quitting drugs. General instructions Schedule regular health, dental, and eye exams. Stay current with your vaccines. Tell your health care provider if: You often feel depressed. You have ever been abused or do not feel safe at home. Summary Adopting a healthy lifestyle and getting preventive care are  important in promoting health and wellness. Follow your health care provider's instructions about healthy diet, exercising, and getting tested or screened for diseases. Follow your health care provider's instructions on monitoring your cholesterol and blood pressure. This information is not intended to replace advice given to you by your health care provider. Make sure you discuss any questions you have with your health care provider. Document Revised: 10/23/2020 Document Reviewed: 10/23/2020 Elsevier Patient Education  [place].

## 2021-07-20 NOTE — Progress Notes (Signed)
MEDICARE ANNUAL WELLNESS VISIT  07/20/2021  Telephone Visit Disclaimer This Medicare AWV was conducted by telephone due to national recommendations for restrictions regarding the COVID-19 Pandemic (e.g. social distancing).  I verified, using two identifiers, that I am speaking with Traci [name] or their authorized healthcare agent. I discussed the limitations, risks, security, and privacy concerns of performing an evaluation and management service by telephone and the potential availability of an in-person appointment in the future. The patient expressed understanding and agreed to proceed.  Location of Patient: Home Location of Provider (nurse):  In the office.  Subjective:    Traci [name] is a 76 y.o. female patient of Metheney, Rene Kocher, MD who had a Medicare Annual Wellness Visit today via telephone. Traci [name] is Retired and lives with their son. she has 1 child. she reports that she is socially active and does interact with friends/family regularly. she is minimally physically active and enjoys Likes to go dancing and likes play online games such as scrabble. .  Patient Care Team: Hali Marry, MD as PCP - General (Family Medicine) Sanda Klein, MD as PCP - Cardiology (Cardiology) Erline Levine, MD as Consulting Physician (Neurosurgery) Janie Morning, MD (Gastroenterology)  Advanced Directives 07/20/2021 02/06/2021 06/21/2020 09/09/2019 06/21/2019 05/30/2019 12/16/2018  Does Patient Have a Medical Advance Directive? Yes Yes Yes Yes Yes No Yes  Type of Paramedic of [place];Living will Living will;Healthcare Power of [place];Living will [place];Living will [place];Living will - [place];Living will  Does patient want to make changes to medical advance directive? No - Patient declined - No - Patient declined - No - Patient declined  - No - Patient declined  Copy of [place] in Chart? No - copy requested - Yes - validated most recent copy scanned in chart (See row information) No - copy requested No - copy requested - -  Would patient like information on creating a medical advance directive? - No - Patient declined No - Patient declined No - Patient declined - No - Patient declined -  Pre-existing out of facility DNR order (yellow form or pink MOST form) - - - - - - -    Hospital Utilization Over the Past 12 Months: # of hospitalizations or ER visits: 3 # of surgeries: 0  Review of Systems    Patient reports that her overall health is better compared to last year.  History obtained from chart review and the patient  Patient Reported Readings (BP, Pulse, CBG, Weight, etc) none  Pain Assessment Pain : No/denies pain     Current Medications & Allergies (verified) Allergies as of 07/20/2021       Reactions   Cortisone Anaphylaxis   Dilaudid [hydromorphone Hcl] Nausea And Vomiting   Pt states she will start vomiting immediately for 6 hours   Iodine Anaphylaxis   Medrol [methylprednisolone] Anaphylaxis   Omnipaque [iohexol] Anaphylaxis   Prednisone Anaphylaxis, Swelling   Shellfish Allergy Anaphylaxis   Sulfa Drugs Cross Reactors Anaphylaxis   Z-pak [azithromycin] Swelling   Doxycycline    Thrush/itching   Ivp Dye [iodinated Contrast Media]    Other reaction(s): Unknown   Methylprednisolone Sodium Succ Swelling   Blood pressure drops immediately   Sulfa Antibiotics Other (See Comments)   Stomach upset   Augmentin [amoxicillin-pot Clavulanate] Nausea Only   Upset stomach (Has taken cephalexin in the past without problems)   Codeine Rash, Other (  See Comments)   Upset stomach   Erythromycin Rash   Morphine And Related Nausea Only   nausea        Medication List        Accurate as of July 20, 2021 11:18 AM. If you have any questions, ask your nurse or doctor.           aspirin EC 81 MG tablet Take 81 mg by mouth daily.   atenolol 25 MG tablet Commonly known as: TENORMIN TAKE 1 TABLET DAILY   atorvastatin 10 MG tablet Commonly known as: LIPITOR TAKE 1 TABLET DAILY AT 6 P.M.   candesartan 16 MG tablet Commonly known as: ATACAND TAKE ONE-HALF (1/2) TABLET DAILY   cholecalciferol 1000 units tablet Commonly known as: VITAMIN D Take 1,000 Units by mouth daily.   clopidogrel 75 MG tablet Commonly known as: PLAVIX TAKE 1 TABLET DAILY   clorazepate 7.5 MG tablet Commonly known as: TRANXENE TAKE 1 TABLET DAILY AS NEEDED   cyanocobalamin 1000 MCG tablet Take 1,000 mcg by mouth daily.   EPINEPHrine 0.3 mg/0.3 mL [place] injection Commonly known as: EpiPen 2-Pak Inject 0.3 mLs (0.3 mg total) into the muscle as needed (for allergic reaction).   esomeprazole 40 MG capsule Commonly known as: NEXIUM Take 1 capsule (40 mg total) by mouth daily. Take 1 tab daily   estradiol 0.5 MG tablet Commonly known as: ESTRACE TAKE 1 TABLET DAILY   furosemide 20 MG tablet Commonly known as: LASIX TAKE 1 TABLET EVERY OTHER DAY   linaclotide 145 MCG Caps capsule Commonly known as: LINZESS Take 145 mcg by mouth 2 (two) times a week. Sunday and wednesday   meclizine 25 MG tablet Commonly known as: ANTIVERT TAKE ONE-HALF (1/2) TABLET THREE TIMES A DAY AS NEEDED FOR DIZZINESS OR NAUSEA   multivitamin with minerals Tabs tablet Take 1 tablet by mouth daily.   zolpidem 5 MG tablet Commonly known as: AMBIEN Take 1 tablet (5 mg total) by mouth at bedtime as needed for sleep.        History (reviewed): Past Medical History:  Diagnosis Date   Allergy 1995   Contrast dye   Anxiety    Atrial fibrillation (HCC)    CAD (coronary artery disease)    Claudication ([place]) 01/06/2008   Lower extremity dopplers - no evidence of arterial insufficiency, normal exam   Coronary artery disease    GERD (gastroesophageal reflux disease)    Heart murmur     Hyperlipidemia    Hypertension 11/30/2010   echo- EF 55%; normal w/ mildly sclerotic aortic valve   Hypertension 11/05/2011   renal dopplers - celiac artery and SMA >50% diameter reduction, R renal artery - mildly elevated velocities 1-59% diameter reduction, L renal artery normal   Nonspecific ST-T wave electrocardiographic changes 03/26/2011   R/Lmv - EF 74%, normal perfusion all regions, ST depression w/ Lexiscan infusion w/o assoc angina   Osteopenia    PAD (peripheral artery disease) (HCC)    Peripheral vascular disease (HCC)    PVD (peripheral vascular disease) ([place]) 11/05/2011   doppler - R/L brachial pressures essentially equal w/o inflow disease; L sublclavian/CCA bypass graft demonstrates patent flow, no evidence of significant stenosis   Sigmoid diverticulitis    Past Surgical History:  Procedure Laterality Date   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 CFX, RCA OK.   CATARACT EXTRACTION     CHOLECYSTECTOMY     EYE SURGERY     retinal surgery   SMALL INTESTINE SURGERY     SPINE SURGERY  07/2009   SUBCLAVIAN ARTERY STENT  1995   X's  several   SUBCLAVIAN ARTERY STENT Left 09/20/2008   LSA ISR 7x81mm Cordis Genesis on Opta premount, reduction from 80% to 0%   subclavian artery stents  01/23/2010   Left carotid to subclavian artery Bypass   VISCERAL ANGIOGRAM  01/17/2014   Procedure: VISCERAL ANGIOGRAM;  Surgeon: Angelia Mould, MD;  Location: [facility] CATH LAB;  Service: Cardiovascular;;   Family History  Problem Relation Age of Onset   Heart disease Father        Heart Disease before age 19   Kidney disease Father    Heart attack Father     Hyperlipidemia Father    Hypertension Father    Alcohol abuse Father    Kidney failure Mother    Arthritis Mother    Diabetes Maternal Grandmother    Heart disease Paternal Grandfather    Social History   Socioeconomic History   Marital status: Widowed    Spouse name: Not on file   Number of children: 1   Years of education: 49   Highest education level: Associate degree: academic program  Occupational History   Occupation: retired    Comment: Marine scientist  Tobacco Use   Smoking status: Former    Packs/day: 0.25    Years: 20.00    Pack years: 5.00    Types: Cigarettes    Quit date: 09/30/1993    Years since quitting: 27.8   Smokeless tobacco: Never  Vaping Use   Vaping Use: Never used  Substance and Sexual Activity   Alcohol use: No   Drug use: No   Sexual activity: Not Currently  Other Topics Concern   Not on file  Social History Narrative   Lives with her son. Likes to go dancing and likes play online games such as scrabble.    Social Determinants of Health   Financial Resource Strain: Low Risk    Difficulty of Paying Living Expenses: Not hard at all  Food Insecurity: No Food Insecurity   Worried About Charity fundraiser in the Last Year: Never true   [place] in the Last Year: Never true  Transportation Needs: No Transportation Needs   Lack of Transportation (Medical): No   Lack of Transportation (Non-Medical): No  Physical Activity: Inactive   Days of Exercise per Week: 0 days   Minutes of Exercise per Session: 0 min  Stress: No Stress Concern Present   Feeling of Stress : Only a little  Social Connections: Moderately Isolated   Frequency of Communication with Friends and Family: Three times a week   Frequency of Social Gatherings with Friends and Family: More than three times a week   Attends Religious Services: Never   Marine scientist or Organizations: Yes   Attends Music therapist: More than 4 times per year   Marital Status:  Widowed    Activities of Daily Living In your present state of health, do you have any difficulty performing the following activities: 07/20/2021 07/19/2021  Hearing? N N  Vision? N N  Difficulty concentrating or making decisions? N N  Walking or  climbing stairs? N N  Dressing or bathing? N N  Doing errands, shopping? N N  Preparing Food and eating ? N N  Using the Toilet? N N  In the past six months, have you accidently leaked urine? N Y  Do you have problems with loss of bowel control? N N  Managing your Medications? N N  Managing your Finances? N N  Housekeeping or managing your Housekeeping? N N  Some recent data might be hidden    Patient Education/ Literacy How often do you need to have someone help you when you read instructions, pamphlets, or other written materials from your doctor or pharmacy?: 1 - Never What is the last grade level you completed in school?: Associates degree  Exercise Current Exercise Habits: Home exercise routine, Type of exercise: walking, Time (Minutes): 20, Frequency (Times/Week): 2, Weekly Exercise (Minutes/Week): 40, Exercise limited by: None identified  Diet Patient reports consuming 6 small meals a day and 0 snack(s) a day Patient reports that her primary diet is: Regular Patient reports that she does have regular access to food.   Depression Screen PHQ 2/9 Scores 07/20/2021 07/02/2021 01/08/2021 06/21/2020 06/21/2019 01/15/2019 06/15/2018  PHQ - 2 Score 0 0 0 0 0 0 0  PHQ- 9 Score - - - 0 - 0 -     Fall Risk Fall Risk  07/20/2021 07/19/2021 07/02/2021 01/08/2021 06/21/2020  Falls in the past year? 0 0 0 0 0  Comment - - - - -  Number falls in past yr: 0 - 0 0 0  Injury with Fall? 0 - 0 0 0  Risk for fall due to : No Fall Risks - No Fall Risks - -  Follow up Falls evaluation completed - Falls evaluation completed;Falls prevention discussed - Falls evaluation completed     Objective:  Traci [name] seemed alert and oriented and she  participated appropriately during our telephone visit.  Blood Pressure Weight BMI  BP Readings from Last 3 Encounters:  07/02/21 (!) 131/54  06/22/21 (!) 174/76  05/31/21 (!) 148/78   Wt Readings from Last 3 Encounters:  07/02/21 151 lb (68.5 kg)  06/22/21 152 lb 8 oz (69.2 kg)  05/31/21 158 lb (71.7 kg)   BMI Readings from Last 1 Encounters:  07/02/21 25.92 kg/m    *Unable to obtain current vital signs, weight, and BMI due to telephone visit type  Hearing/Vision  Traci [name] did not seem to have difficulty with hearing/understanding during the telephone conversation Reports that she has had a formal eye exam by an eye care professional within the past year Reports that she has not had a formal hearing evaluation within the past year *Unable to fully assess hearing and vision during telephone visit type  Cognitive Function: 6CIT Screen 07/20/2021 06/21/2020 06/21/2019 06/15/2018 08/22/2016  What Year? 0 points 0 points 0 points 0 points 0 points  What month? 0 points 0 points 0 points 0 points 0 points  What time? 0 points 0 points 0 points 0 points 0 points  Count back from 20 0 points 0 points 0 points 0 points 0 points  Months in reverse 0 points 0 points 0 points 0 points 0 points  Repeat phrase 0 points 0 points 0 points 2 points 0 points  Total Score 0 0 0 2 0   (Normal:0-7, Significant for Dysfunction: >8)  Normal Cognitive Function Screening: Yes   Immunization & Health Maintenance Record Immunization History  Administered Date(s) Administered   Fluad Quad(high Dose  65+) 03/01/2019, 02/28/2020, 02/28/2021   Influenza, High Dose Seasonal PF 03/25/2018   Influenza, Seasonal, Injecte, Preservative Fre 03/01/2019   Influenza-Unspecified 03/02/2015, 03/15/2016, 02/21/2017   Lyme Disease 03/20/2009   PFIZER(Purple Top)SARS-COV-2 Vaccination 07/22/2019, 08/16/2019, 03/15/2020, 12/30/2020   Pneumococcal Conjugate-13 08/30/2014   Pneumococcal Polysaccharide-23 10/10/2011,  06/17/2012   Tdap 06/17/2010, 04/08/2011, 03/23/2021   Zoster Recombinat (Shingrix) 03/23/2021, 05/24/2021   Zoster, Live 03/22/2012, 06/17/2012    Health Maintenance  Topic Date Due   COVID-19 Vaccine (5 - Booster for Pfizer series) 08/05/2021 (Originally 02/24/2021)   COLONOSCOPY (Pts 45-55yrs Insurance coverage will need to be confirmed)  09/28/2021 (Originally 07/16/2020)   DEXA SCAN  04/05/2022   TETANUS/TDAP  03/24/2031   Pneumonia Vaccine 12+ Years old  Completed   INFLUENZA VACCINE  Completed   Hepatitis C Screening  Completed   Zoster Vaccines- Shingrix  Completed   HPV VACCINES  Aged Out       Assessment  This is a routine wellness examination for [employer].  Health Maintenance: Due or Overdue There are no preventive care reminders to display for this patient.   Traci [name] does not need a referral for [employer] Assistance: Care Management:   no Social Work:    no Prescription Assistance:  no Nutrition/Diabetes Education:  no   Plan:  Personalized Goals  Goals Addressed               This Visit's Progress     Patient Stated (pt-stated)        Patient states she would like to start travelling again.       Personalized Health Maintenance & Screening Recommendations  Colorectal cancer screening  Lung Cancer Screening Recommended: no (Low Dose CT Chest recommended if Age 20-80 years, 30 pack-year currently smoking OR have quit w/in past 15 years) Hepatitis C Screening recommended: no HIV Screening recommended: no  Advanced Directives: Written information was not prepared per patient's request.  Referrals & Orders No orders of the defined types were placed in this encounter.   Follow-up Plan Follow-up with Hali Marry, MD as planned Let us know after you discuss colonoscopy with your GI doctor. Medicare wellness visit in one year. Patient will access AVS on mychart.   I have personally reviewed and noted  the following in the patients chart:   Medical and social history Use of alcohol, tobacco or illicit drugs  Current medications and supplements Functional ability and status Nutritional status Physical activity Advanced directives List of other physicians Hospitalizations, surgeries, and ER visits in previous 12 months Vitals Screenings to include cognitive, depression, and falls Referrals and appointments  In addition, I have reviewed and discussed with Traci [name] certain preventive protocols, quality metrics, and best practice recommendations. A written personalized care plan for preventive services as well as general preventive health recommendations is available and can be mailed to the patient at her request.      Tinnie Gens, RN  07/20/2021

## 2021-07-30 ENCOUNTER — Encounter: Payer: Self-pay | Admitting: Cardiovascular Disease

## 2021-07-31 ENCOUNTER — Telehealth: Payer: Self-pay | Admitting: *Deleted

## 2021-07-31 NOTE — Telephone Encounter (Signed)
° °  Pre-operative Risk Assessment    Patient Name: Traci [name]  DOB: 1945-07-14 MRN: [id number]      Request for Surgical Clearance    Procedure:   EGD/ENS    ( pain upper abdomen- needs pancreatic duct dilated)  Date of Surgery:  Clearance 08/24/21                                 Surgeon:  Dr Rudean Curt Surgeon's Group or Practice Name:  [facility] Phone number:   [phone] Fax number:  [phone]   Type of Clearance Requested:   - Medical  - Pharmacy:  Hold Clopidogrel (Plavix) 5-7 days prior to procedure   Type of Anesthesia:  Not Indicated   Additional requests/questions:  Please advise surgeon/provider what medications should be held.  Olin Pia   07/31/2021, 12:00 PM

## 2021-07-31 NOTE — Telephone Encounter (Signed)
Patient already has a follow-up appointment with Laurann Montana, NP, scheduled for 08/08/2021 which is before date of procedure. Therefore, pre-op risk assessment can be addressed at that time. Will route clearance form to [place] so that she is aware and add "PREOP EVAL" to appointment notes.   Dr. Sallyanne Kuster, can you please go ahead and comment on how long Plavix can be held so that Urban Gibson has this at the time of her visit? Looks like she is on this for PAD but we have been prescribing. She has a history of SMA stenosis s/p bypass, left subclavian artery occlusion, and mild non-obstructive PAD of bilateral lower extremities. Please route response to P CV DIV PREOP.  Thank you! Jacson Rapaport

## 2021-08-02 ENCOUNTER — Other Ambulatory Visit: Payer: Self-pay | Admitting: Neurology

## 2021-08-02 MED ORDER — CLORAZEPATE DIPOTASSIUM 7.5 MG PO TABS: 3.7500 mg | ORAL_TABLET | Freq: Every day | ORAL | 0 refills | Status: DC | PRN

## 2021-08-02 NOTE — Telephone Encounter (Signed)
Last written 04/18/2021 #90 no refills  Last appt 07/02/2021

## 2021-08-02 NOTE — Telephone Encounter (Signed)
Did go ahead and send prescription today.  But please let patient know that we really need to start weaning down on this medication since it is a benzodiazepine there is a significant increase risk of health problems with continued use of this medication just really encouraged her to try to occasionally take a half a tab if she is doing well.  We can always discuss further at the next office visit.

## 2021-08-03 NOTE — Telephone Encounter (Signed)
Patient made aware of recommendations from Dr. Madilyn Fireman.

## 2021-08-04 ENCOUNTER — Encounter: Payer: Self-pay | Admitting: Family Medicine

## 2021-08-06 MED ORDER — CLORAZEPATE DIPOTASSIUM 7.5 MG PO TABS: 3.7500 mg | ORAL_TABLET | Freq: Every day | ORAL | 0 refills | Status: DC | PRN

## 2021-08-06 NOTE — Telephone Encounter (Signed)
Meds ordered this encounter  Medications   clorazepate (TRANXENE) 7.5 MG tablet    Sig: Take 0.5-1 tablets (3.75-7.5 mg total) by mouth daily as needed.    Dispense:  90 tablet    Refill:  0

## 2021-08-07 NOTE — Progress Notes (Deleted)
Office Visit    Patient Name: Traci [name] Date of Encounter: 08/07/2021  PCP:  Hali Marry, MD   [place]  Cardiologist:  Sanda Klein, MD  Advanced Practice Provider:  No care team member to display Electrophysiologist:  None   HPI    Traci [name] is a 76 y.o. female with a hx of peripheral artery disease involving the mesenteric circulation and upper extremities, mild nonobstructive coronary atherosclerosis, hyperlipidemia, and hypertension presents today for 69-month follow-up appointment.  Patient was last seen May 2022 at about a month prior to this appointment she was hospitalized in [place] with a consultation of unexplained complaints.  She presented with headache and nausea and was found to have severely elevated blood pressure.  Per report systolic was 947 mmHg.  While she was there was discovered that she was hypoxemic with oxygen saturation at 84%.  Several hours later she developed low-grade fever of 101 F and shaking chills.  She was tested for COVID and influenza, both test negative.  She received empiric antibiotics.  CT and MRI of the head were unremarkable.  She had an echocardiogram which showed normal LVEF and no regional wall motion abnormalities and no new valvular issues.  She had a carotid ultrasound which did not show any significant obstruction.  Her symptoms resolved in 24 hours and she was discharged.  She reported feeling symptoms of claudication when walking in her neighborhood.  Her symptoms promptly resolved with rest and predictably reoccurred every time she started walking again.  She had a slight increase in her ankle edema and was only taking 20 mg of furosemide every other day.  Blood pressure at her last appointment was normal.  There was no notable difference in the right versus left upper extremities.  Her biggest complaint at her last appointment was epigastric discomfort or pain in  her spine.  Ultrasound was performed November 2021 and showed a widely patent superior mesenteric artery bypass graft.  Her most recent carotid ultrasound was performed July 2020 and showed no evidence of obstruction and a widely patent left common carotid to left subclavian bypass graft.  She had a carotid ultrasound performed during her hospitalization at [facility] March 2022 that reportedly showed no obstruction.  Today, she ***  Past Medical History    Past Medical History:  Diagnosis Date   Allergy 1995   Contrast dye   Anxiety    Atrial fibrillation (HCC)    CAD (coronary artery disease)    Claudication ([place]) 01/06/2008   Lower extremity dopplers - no evidence of arterial insufficiency, normal exam   Coronary artery disease    GERD (gastroesophageal reflux disease)    Heart murmur    Hyperlipidemia    Hypertension 11/30/2010   echo- EF 55%; normal w/ mildly sclerotic aortic valve   Hypertension 11/05/2011   renal dopplers - celiac artery and SMA >50% diameter reduction, R renal artery - mildly elevated velocities 1-59% diameter reduction, L renal artery normal   Nonspecific ST-T wave electrocardiographic changes 03/26/2011   R/Lmv - EF 74%, normal perfusion all regions, ST depression w/ Lexiscan infusion w/o assoc angina   Osteopenia    PAD (peripheral artery disease) (HCC)    Peripheral vascular disease (HCC)    PVD (peripheral vascular disease) ([place]) 11/05/2011   doppler - R/L brachial pressures essentially equal w/o inflow disease; L sublclavian/CCA bypass graft demonstrates patent flow, no evidence of significant stenosis   Sigmoid diverticulitis  Past Surgical History:  Procedure Laterality Date   ABDOMINAL AORTIC ANEURYSM REPAIR N/A 01/27/2014   Procedure: AORTO-SUPERIOR MESENTERIC ARTERY BYPASS GRAFT;  Surgeon: Angelia Mould, MD;  Location: [facility];  Service: Vascular;  Laterality: N/A;   ABDOMINAL HYSTERECTOMY     APPENDECTOMY      CARDIAC CATHETERIZATION  06/03/2008   60% LAD, involving D2, borderline significant by IVUS, medical therapy. CFX, RCA OK.   CATARACT EXTRACTION     CHOLECYSTECTOMY     EYE SURGERY     retinal surgery   SMALL INTESTINE SURGERY     SPINE SURGERY  07/2009   SUBCLAVIAN ARTERY STENT  1995   X's  several   SUBCLAVIAN ARTERY STENT Left 09/20/2008   LSA ISR 7x59mm Cordis Genesis on Opta premount, reduction from 80% to 0%   subclavian artery stents  01/23/2010   Left carotid to subclavian artery Bypass   VISCERAL ANGIOGRAM  01/17/2014   Procedure: VISCERAL ANGIOGRAM;  Surgeon: Angelia Mould, MD;  Location: [facility] CATH LAB;  Service: Cardiovascular;;    Allergies  Allergies  Allergen Reactions   Cortisone Anaphylaxis   Dilaudid [Hydromorphone Hcl] Nausea And Vomiting    Pt states she will start vomiting immediately for 6 hours   Iodine Anaphylaxis   Medrol [Methylprednisolone] Anaphylaxis   Omnipaque [Iohexol] Anaphylaxis   Prednisone Anaphylaxis and Swelling   Shellfish Allergy Anaphylaxis   Sulfa Drugs Cross Reactors Anaphylaxis   Z-Pak [Azithromycin] Swelling   Doxycycline     Thrush/itching   Ivp Dye [Iodinated Contrast Media]     Other reaction(s): Unknown   Methylprednisolone Sodium Succ Swelling    Blood pressure drops immediately   Sulfa Antibiotics Other (See Comments)    Stomach upset   Augmentin [Amoxicillin-Pot Clavulanate] Nausea Only    Upset stomach (Has taken cephalexin in the past without problems)   Codeine Rash and Other (See Comments)    Upset stomach   Erythromycin Rash   Morphine And Related Nausea Only    nausea     EKGs/Labs/Other Studies Reviewed:   The following studies were reviewed today: Duplex carotid ultrasound December 16, 2018: Summary: Right Carotid: Velocities in the right ICA are consistent with a 1-39% stenosis.   Left Carotid: Velocities in the left ICA are consistent with a 1-39% stenosis.               Widely patent left common  carotid to subclavian artery bypass               graft.   Vertebrals:  Bilateral vertebral arteries demonstrate antegrade flow. Subclavians: Normal flow hemodynamics were seen in bilateral subclavian              arteries.    Lexiscan Myoview 01/13/2019: The left ventricular ejection fraction is hyperdynamic (>65%). Nuclear stress EF: 71%. There was no ST segment deviation noted during stress. T wave inversion was noted during stress in the II, III, aVF, V3, V4, V5 and V6 leads, beginning at 30 seconds of stress, and returning to baseline after less than 1 min of recovery. This is a low risk study.   No ischemia or infarction noted.  There is significant tracer uptake in the bowel which may impact the interpretation of the inferior wall which appears to have normal perfusion. Wall motion is normal, therefore perfusion is likely normal.    Consider performing future studies on the D-Spect (supine and upright) camera.      Echo [facility] 09/13/2020 Left  Ventricle: Systolic function is normal. EF: 60-65%.    Left Atrium: Injection of agitated saline documents no interatrial  shunt. .    Left Ventricle  Normal left ventricular size. Wall thickness is normal. Systolic function is normal. EF: 60-65%. Wall motion is normal. Doppler parameters indicate normal diastolic function.   Right Ventricle  Right ventricle is normal. Systolic function is normal.   Left Atrium  Left atrium is normal in size. Injection of agitated saline documents no interatrial shunt.   Right Atrium  Normal sized right atrium.   IVC/SVC  The inferior vena cava demonstrates a diameter of <=2.1 cm and collapses >50%; therefore, the right atrial pressure is estimated at 3 mmHg.   Mitral Valve  Mitral valve structure is normal. There is trace regurgitation.   Tricuspid Valve  Tricuspid valve structure is normal. There is trace regurgitation. Unable to assess RVSP due to incomplete Doppler signal.    Aortic Valve  The aortic valve is tricuspid. The leaflets are not thickened and exhibit normal excursion. There is no regurgitation or stenosis.   Pulmonic Valve  The pulmonic valve was not well visualized. Trace regurgitation.   Ascending Aorta  The aortic root is normal in size.   Pericardium  There is no pericardial effusion.   Study Details  A complete echo was performed using complete 2D, color flow Doppler and spectral Doppler. During the study the apical, parasternal and subcostal view was captured. Saline (bubble) contrast was injected during the study. Overall the study quality was fair.   Carotid ultrasound [facility] 09/12/2020   IMPRESSION:  1.  No hemodynamically significant stenosis on either side.    2.  Both vertebral arteries are patent with antegrade flow.  3.  Visualized bilateral subclavian arteries appear patent.   Degree of stenosis is determined using NASCET measurement technique:  Severe:  70-90%  Moderate:  50-69%  Mild:  Less than 50%     Abdominal aortic ultrasound [facility] 09/12/2020 IMPRESSION:  Visualization of superior mesenteric artery is limited, however the proximal aspect is appears to be patent. It is unclear if the entire graft is visualized. A CT scan of the abdomen and pelvis with intravenous contrast would be helpful in further evaluation.   Vascular:  - Proximal abdominal aorta measures: 2.1 cm AP x 2.2 cm in width.  - Mid abdominal aorta measures 1.4 cm AP x 1.5 cm in width.    - Distal abdominal aorta measures 1.2 cm AP x 1.2 cm in width.   Blood flow is documented in the normal direction in the abdominal aorta.   - Right common iliac artery measures 0.7 cm AP.  - Left common iliac artery measures 0.7 cm AP.   - Proximal aspect of the superior mesenteric artery was identified and is patent. The remainder of the SMA was not visualized due to bowel gas.   Miscellaneous:  - Left kidneys are normal in size, with the right  kidney measuring up to 9.2 cm and the left measuring up to 9.7 cm.    EKG:  EKG is *** ordered today.  The ekg ordered today demonstrates ***  Recent Labs: 02/06/2021: ALT 22; B Natriuretic Peptide 32.1; BUN 13; Creatinine, Ser 0.94; Hemoglobin 13.0; Platelets 291; Potassium 4.2; Sodium 135  Recent Lipid Panel    Component Value Date/Time   CHOL 122 02/29/2020 0918   CHOL 129 02/17/2019 1234   TRIG 145 02/29/2020 0918   HDL 47 (L) 02/29/2020 0918   HDL 47  02/17/2019 1234   CHOLHDL 2.6 02/29/2020 0918   VLDL 26 02/07/2016 0822   LDLCALC 52 02/29/2020 0918    Risk Assessment/Calculations:  {Does this patient have ATRIAL FIBRILLATION?[phone]}  Home Medications   No outpatient medications have been marked as taking for the 08/08/21 encounter (Appointment) with Loel Dubonnet, NP.     Review of Systems   ***   All other systems reviewed and are otherwise negative except as noted above.  Physical Exam    VS:  There were no vitals taken for this visit. , BMI There is no height or weight on file to calculate BMI.  Wt Readings from Last 3 Encounters:  07/02/21 151 lb (68.5 kg)  06/22/21 152 lb 8 oz (69.2 kg)  05/31/21 158 lb (71.7 kg)     GEN: Well nourished, well developed, in no acute distress. HEENT: normal. Neck: Supple, no JVD, carotid bruits, or masses. Cardiac: ***RRR, no murmurs, rubs, or gallops. No clubbing, cyanosis, edema.  ***Radials/PT 2+ and equal bilaterally.  Respiratory:  ***Respirations regular and unlabored, clear to auscultation bilaterally. GI: Soft, nontender, nondistended. MS: No deformity or atrophy. Skin: Warm and dry, no rash. Neuro:  Strength and sensation are intact. Psych: Normal affect.  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  Signed, Elgie Collard, PA-C 08/07/2021, 12:18 PM [facility]

## 2021-08-08 ENCOUNTER — Other Ambulatory Visit: Payer: Self-pay

## 2021-08-08 ENCOUNTER — Ambulatory Visit (INDEPENDENT_AMBULATORY_CARE_PROVIDER_SITE_OTHER): Payer: Medicare Other | Admitting: Physician Assistant

## 2021-08-08 ENCOUNTER — Encounter (HOSPITAL_BASED_OUTPATIENT_CLINIC_OR_DEPARTMENT_OTHER): Payer: Self-pay | Admitting: Physician Assistant

## 2021-08-08 VITALS — BP 138/70 | HR 54 | Ht 64.0 in | Wt 153.2 lb

## 2021-08-08 DIAGNOSIS — I10 Essential (primary) hypertension: Secondary | ICD-10-CM | POA: Diagnosis not present

## 2021-08-08 DIAGNOSIS — I2510 Atherosclerotic heart disease of native coronary artery without angina pectoris: Secondary | ICD-10-CM

## 2021-08-08 DIAGNOSIS — E7800 Pure hypercholesterolemia, unspecified: Secondary | ICD-10-CM | POA: Diagnosis not present

## 2021-08-08 DIAGNOSIS — K551 Chronic vascular disorders of intestine: Secondary | ICD-10-CM

## 2021-08-08 DIAGNOSIS — R002 Palpitations: Secondary | ICD-10-CM | POA: Diagnosis not present

## 2021-08-08 DIAGNOSIS — I708 Atherosclerosis of other arteries: Secondary | ICD-10-CM | POA: Diagnosis not present

## 2021-08-08 DIAGNOSIS — I700 Atherosclerosis of aorta: Secondary | ICD-10-CM | POA: Diagnosis not present

## 2021-08-08 DIAGNOSIS — Z01810 Encounter for preprocedural cardiovascular examination: Secondary | ICD-10-CM | POA: Diagnosis not present

## 2021-08-08 DIAGNOSIS — Z01818 Encounter for other preprocedural examination: Secondary | ICD-10-CM

## 2021-08-08 NOTE — Progress Notes (Signed)
Office Visit    Patient Name: Traci [name] Date of Encounter: 08/08/2021  PCP:  Hali Marry, MD   [place]  Cardiologist:  Sanda Klein, MD  Advanced Practice Provider:  No care team member to display Electrophysiologist:  None   HPI    Traci [name] is a 76 y.o. female with a hx of peripheral artery disease involving the mesenteric circulation and upper extremities, mild nonobstructive coronary atherosclerosis, hyperlipidemia, and hypertension presents today for 57-month follow-up appointment.  Patient was last seen May 2022 at about a month prior to this appointment she was hospitalized in [place] with a consultation of unexplained complaints.  She presented with headache and nausea and was found to have severely elevated blood pressure.  Per report systolic was 580 mmHg.  While she was there was discovered that she was hypoxemic with oxygen saturation at 84%.  Several hours later she developed low-grade fever of 101 F and shaking chills.  She was tested for COVID and influenza, both test negative.  She received empiric antibiotics.  CT and MRI of the head were unremarkable.  She had an echocardiogram which showed normal LVEF and no regional wall motion abnormalities and no new valvular issues.  She had a carotid ultrasound which did not show any significant obstruction.  Her symptoms resolved in 24 hours and she was discharged.  She reported feeling symptoms of claudication when walking in her neighborhood.  Her symptoms promptly resolved with rest and predictably reoccurred every time she started walking again.  She had a slight increase in her ankle edema and was only taking 20 mg of furosemide every other day.  Blood pressure at her last appointment was normal.  There was no notable difference in the right versus left upper extremities.  Her biggest complaint at her last appointment was epigastric discomfort or pain in  her spine.  Ultrasound was performed November 2021 and showed a widely patent superior mesenteric artery bypass graft.  Her most recent carotid ultrasound was performed July 2020 and showed no evidence of obstruction and a widely patent left common carotid to left subclavian bypass graft.  She had a carotid ultrasound performed during her hospitalization at [facility] March 2022 that reportedly showed no obstruction.  Today, she states that she needs to get an endoscopic dilation of her pancreatic duct. She has not experienced any new cardiovascular symptoms. As far as her PAD is concerned she has not had any claudication symptoms, the only symptoms she has is her feet get cold at night. Swelling has improved since starting lasix. We reviewed her recent monitor which showed brief episodes of atrial tachycardia but her baseline HR is sinus bradycardia. Her BB cannot be increased due to this. She dances shag a few times a week and has never had any symptoms.   Reports no shortness of breath nor dyspnea on exertion. Reports no chest pain, pressure, or tightness. No edema, orthopnea, PND. Reports no palpitations.    Past Medical History    Past Medical History:  Diagnosis Date   Allergy 1995   Contrast dye   Anxiety    Atrial fibrillation (HCC)    CAD (coronary artery disease)    Claudication ([place]) 01/06/2008   Lower extremity dopplers - no evidence of arterial insufficiency, normal exam   Coronary artery disease    GERD (gastroesophageal reflux disease)    Heart murmur    Hyperlipidemia    Hypertension 11/30/2010   echo-  EF 55%; normal w/ mildly sclerotic aortic valve   Hypertension 11/05/2011   renal dopplers - celiac artery and SMA >50% diameter reduction, R renal artery - mildly elevated velocities 1-59% diameter reduction, L renal artery normal   Nonspecific ST-T wave electrocardiographic changes 03/26/2011   R/Lmv - EF 74%, normal perfusion all regions, ST depression w/  Lexiscan infusion w/o assoc angina   Osteopenia    PAD (peripheral artery disease) ([place])    Peripheral vascular disease (HCC)    PVD (peripheral vascular disease) ([place]) 11/05/2011   doppler - R/L brachial pressures essentially equal w/o inflow disease; L sublclavian/CCA bypass graft demonstrates patent flow, no evidence of significant stenosis   Sigmoid diverticulitis    Past Surgical History:  Procedure Laterality Date   ABDOMINAL AORTIC ANEURYSM REPAIR N/A 01/27/2014   Procedure: AORTO-SUPERIOR MESENTERIC ARTERY BYPASS GRAFT;  Surgeon: Angelia Mould, MD;  Location: [place];  Service: Vascular;  Laterality: N/A;   ABDOMINAL HYSTERECTOMY     APPENDECTOMY     CARDIAC CATHETERIZATION  06/03/2008   60% LAD, involving D2, borderline significant by IVUS, medical therapy. CFX, RCA OK.   CATARACT EXTRACTION     CHOLECYSTECTOMY     EYE SURGERY     retinal surgery   SMALL INTESTINE SURGERY     SPINE SURGERY  07/2009   SUBCLAVIAN ARTERY STENT  1995   X's  several   SUBCLAVIAN ARTERY STENT Left 09/20/2008   LSA ISR 7x33mm Cordis Genesis on Opta premount, reduction from 80% to 0%   subclavian artery stents  01/23/2010   Left carotid to subclavian artery Bypass   VISCERAL ANGIOGRAM  01/17/2014   Procedure: VISCERAL ANGIOGRAM;  Surgeon: Angelia Mould, MD;  Location: [facility] CATH LAB;  Service: Cardiovascular;;    Allergies  Allergies  Allergen Reactions   Cortisone Anaphylaxis   Dilaudid [Hydromorphone Hcl] Nausea And Vomiting    Pt states she will start vomiting immediately for 6 hours   Iodine Anaphylaxis   Medrol [Methylprednisolone] Anaphylaxis   Omnipaque [Iohexol] Anaphylaxis   Prednisone Anaphylaxis and Swelling   Shellfish Allergy Anaphylaxis   Sulfa Drugs Cross Reactors Anaphylaxis   Z-Pak [Azithromycin] Swelling   Doxycycline     Thrush/itching   Ivp Dye [Iodinated Contrast Media]     Other reaction(s): Unknown   Methylprednisolone Sodium Succ Swelling    Blood  pressure drops immediately   Sulfa Antibiotics Other (See Comments)    Stomach upset   Augmentin [Amoxicillin-Pot Clavulanate] Nausea Only    Upset stomach (Has taken cephalexin in the past without problems)   Codeine Rash and Other (See Comments)    Upset stomach   Erythromycin Rash   Morphine And Related Nausea Only    nausea     EKGs/Labs/Other Studies Reviewed:   The following studies were reviewed today: Duplex carotid ultrasound December 16, 2018: Summary: Right Carotid: Velocities in the right ICA are consistent with a 1-39% stenosis.   Left Carotid: Velocities in the left ICA are consistent with a 1-39% stenosis.               Widely patent left common carotid to subclavian artery bypass               graft.   Vertebrals:  Bilateral vertebral arteries demonstrate antegrade flow. Subclavians: Normal flow hemodynamics were seen in bilateral subclavian              arteries.    Lexiscan Myoview 01/13/2019: The left ventricular ejection  fraction is hyperdynamic (>65%). Nuclear stress EF: 71%. There was no ST segment deviation noted during stress. T wave inversion was noted during stress in the II, III, aVF, V3, V4, V5 and V6 leads, beginning at 30 seconds of stress, and returning to baseline after less than 1 min of recovery. This is a low risk study.   No ischemia or infarction noted.  There is significant tracer uptake in the bowel which may impact the interpretation of the inferior wall which appears to have normal perfusion. Wall motion is normal, therefore perfusion is likely normal.    Consider performing future studies on the D-Spect (supine and upright) camera.      Echo [facility] 09/13/2020 Left Ventricle: Systolic function is normal. EF: 60-65%.    Left Atrium: Injection of agitated saline documents no interatrial  shunt. .    Left Ventricle  Normal left ventricular size. Wall thickness is normal. Systolic function is normal. EF: 60-65%. Wall motion is  normal. Doppler parameters indicate normal diastolic function.   Right Ventricle  Right ventricle is normal. Systolic function is normal.   Left Atrium  Left atrium is normal in size. Injection of agitated saline documents no interatrial shunt.   Right Atrium  Normal sized right atrium.   IVC/SVC  The inferior vena cava demonstrates a diameter of <=2.1 cm and collapses >50%; therefore, the right atrial pressure is estimated at 3 mmHg.   Mitral Valve  Mitral valve structure is normal. There is trace regurgitation.   Tricuspid Valve  Tricuspid valve structure is normal. There is trace regurgitation. Unable to assess RVSP due to incomplete Doppler signal.   Aortic Valve  The aortic valve is tricuspid. The leaflets are not thickened and exhibit normal excursion. There is no regurgitation or stenosis.   Pulmonic Valve  The pulmonic valve was not well visualized. Trace regurgitation.   Ascending Aorta  The aortic root is normal in size.   Pericardium  There is no pericardial effusion.   Study Details  A complete echo was performed using complete 2D, color flow Doppler and spectral Doppler. During the study the apical, parasternal and subcostal view was captured. Saline (bubble) contrast was injected during the study. Overall the study quality was fair.   Carotid ultrasound [facility] 09/12/2020   IMPRESSION:  1.  No hemodynamically significant stenosis on either side.    2.  Both vertebral arteries are patent with antegrade flow.  3.  Visualized bilateral subclavian arteries appear patent.   Degree of stenosis is determined using NASCET measurement technique:  Severe:  70-90%  Moderate:  50-69%  Mild:  Less than 50%     Abdominal aortic ultrasound [facility] 09/12/2020 IMPRESSION:  Visualization of superior mesenteric artery is limited, however the proximal aspect is appears to be patent. It is unclear if the entire graft is visualized. A CT scan of the abdomen and  pelvis with intravenous contrast would be helpful in further evaluation.   Vascular:  - Proximal abdominal aorta measures: 2.1 cm AP x 2.2 cm in width.  - Mid abdominal aorta measures 1.4 cm AP x 1.5 cm in width.    - Distal abdominal aorta measures 1.2 cm AP x 1.2 cm in width.   Blood flow is documented in the normal direction in the abdominal aorta.   - Right common iliac artery measures 0.7 cm AP.  - Left common iliac artery measures 0.7 cm AP.   - Proximal aspect of the superior mesenteric artery was identified  and is patent. The remainder of the SMA was not visualized due to bowel gas.   Miscellaneous:  - Left kidneys are normal in size, with the right kidney measuring up to 9.2 cm and the left measuring up to 9.7 cm.    EKG:  EKG is  ordered today.  The ekg ordered today demonstrates sinus bradycardia, rate 54 bpm  Recent Labs: 02/06/2021: ALT 22; B Natriuretic Peptide 32.1; BUN 13; Creatinine, Ser 0.94; Hemoglobin 13.0; Platelets 291; Potassium 4.2; Sodium 135  Recent Lipid Panel    Component Value Date/Time   CHOL 122 02/29/2020 0918   CHOL 129 02/17/2019 1234   TRIG 145 02/29/2020 0918   HDL 47 (L) 02/29/2020 0918   HDL 47 02/17/2019 1234   CHOLHDL 2.6 02/29/2020 0918   VLDL 26 02/07/2016 0822   LDLCALC 52 02/29/2020 0918     Home Medications   Current Meds  Medication Sig   aspirin EC 81 MG tablet Take 81 mg by mouth daily.   atenolol (TENORMIN) 25 MG tablet TAKE 1 TABLET DAILY   atorvastatin (LIPITOR) 10 MG tablet TAKE 1 TABLET DAILY AT 6 P.M.   candesartan (ATACAND) 16 MG tablet TAKE ONE-HALF (1/2) TABLET DAILY   cholecalciferol (VITAMIN D) 1000 UNITS tablet Take 1,000 Units by mouth daily.   clopidogrel (PLAVIX) 75 MG tablet TAKE 1 TABLET DAILY   clorazepate (TRANXENE) 7.5 MG tablet Take 0.5-1 tablets (3.75-7.5 mg total) by mouth daily as needed.   cyanocobalamin 1000 MCG tablet Take 1,000 mcg by mouth daily.   EPINEPHrine (EPIPEN 2-PAK) 0.3 mg/0.3 mL IJ  SOAJ injection Inject 0.3 mLs (0.3 mg total) into the muscle as needed (for allergic reaction).   esomeprazole (NEXIUM) 40 MG capsule Take 1 capsule (40 mg total) by mouth daily. Take 1 tab daily   estradiol (ESTRACE) 0.5 MG tablet TAKE 1 TABLET DAILY   furosemide (LASIX) 20 MG tablet TAKE 1 TABLET EVERY OTHER DAY   linaclotide (LINZESS) 145 MCG CAPS capsule Take 145 mcg by mouth 2 (two) times a week. Sunday and wednesday   meclizine (ANTIVERT) 25 MG tablet TAKE ONE-HALF (1/2) TABLET THREE TIMES A DAY AS NEEDED FOR DIZZINESS OR NAUSEA   Multiple Vitamin (MULTIVITAMIN WITH MINERALS) TABS Take 1 tablet by mouth daily.   zolpidem (AMBIEN) 5 MG tablet Take 1 tablet (5 mg total) by mouth at bedtime as needed for sleep.     Review of Systems      All other systems reviewed and are otherwise negative except as noted above.  Physical Exam    VS:  BP 138/70    Pulse (!) 54    Ht 5\' 4"  (1.626 m)    Wt 153 lb 3.2 oz (69.5 kg)    SpO2 98%    BMI 26.30 kg/m  , BMI Body mass index is 26.3 kg/m.  Wt Readings from Last 3 Encounters:  08/08/21 153 lb 3.2 oz (69.5 kg)  07/02/21 151 lb (68.5 kg)  06/22/21 152 lb 8 oz (69.2 kg)     GEN: Well nourished, well developed, in no acute distress. HEENT: normal. Neck: Supple, no JVD, carotid bruits, or masses. Cardiac: RRR, no murmurs, rubs, or gallops. No clubbing, cyanosis, edema.  Radials/PT 2+ and equal bilaterally.  Respiratory:  Respirations regular and unlabored, clear to auscultation bilaterally. GI: Soft, nontender, nondistended. MS: No deformity or atrophy. Skin: Warm and dry, no rash. Neuro:  Strength and sensation are intact. Psych: Normal affect.  Assessment & Plan  Preop clearance  Ms. Johnson-Burleson's perioperative risk of a major cardiac event is 0.9% according to the Revised Cardiac Risk Index (RCRI).  Therefore, she is at low risk for perioperative complications.   Her functional capacity is excellent at 5.81 METs according to the  [employer] Activity Status Index (DASI). Recommendations: According to ACC/AHA guidelines, no further cardiovascular testing needed.  The patient may proceed to surgery at acceptable risk.   Antiplatelet and/or Anticoagulation Recommendations: Okay to hold plavix 5-7 days prior to surgery would prefer to continue asa 81mg  throughout perioperative period due to pervious SMA stenting if deemed safe from a surgical perspective.    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  She does have some cold feet at night but otherwise is not lifestyle limiting  Essential hypertension -Well controlled -Continue current medication regimen  Palpitations -Monitor results reviewed with the patient.  -Since her baseline HR is bradycardia we are limited and cannot increase her BB at this time. -Her palpitations are not really bothering her at the current moment -She will reach out if this changes      Disposition: Follow up 3-4 months with Sanda Klein, MD or APP.  Signed, Elgie Collard, PA-C 08/08/2021, 11:55 AM [place]

## 2021-08-08 NOTE — Patient Instructions (Signed)
Medication Instructions:  Your Physician recommend you continue on your current medication as directed.    *If you need a refill on your cardiac medications before your next appointment, please call your pharmacy*   Testing/Procedures: Your are cleared for your procedure. Nicholes Rough, PA will send your clearance to Dr. Ardeen Garland!    Follow-Up: At [facility], you and your health needs are our priority.  As part of our continuing mission to provide you with exceptional heart care, we have created designated Provider Care Teams.  These Care Teams include your primary Cardiologist (physician) and Advanced Practice Providers (APPs -  Physician Assistants and Nurse Practitioners) who all work together to provide you with the care you need, when you need it.  We recommend signing up for the patient portal called "MyChart".  Sign up information is provided on this After Visit Summary.  MyChart is used to connect with patients for Virtual Visits (Telemedicine).  Patients are able to view lab/test results, encounter notes, upcoming appointments, etc.  Non-urgent messages can be sent to your provider as well.   To learn more about what you can do with MyChart, go to [email or web address].    Your next appointment:   Please follow up in May with Dr. Sallyanne Kuster or APP

## 2021-08-22 DIAGNOSIS — H43391 Other vitreous opacities, right eye: Secondary | ICD-10-CM | POA: Diagnosis not present

## 2021-08-22 DIAGNOSIS — H35371 Puckering of macula, right eye: Secondary | ICD-10-CM | POA: Diagnosis not present

## 2021-08-22 DIAGNOSIS — H43813 Vitreous degeneration, bilateral: Secondary | ICD-10-CM | POA: Diagnosis not present

## 2021-08-27 ENCOUNTER — Institutional Professional Consult (permissible substitution): Payer: Medicare Other | Admitting: Neurology

## 2021-08-28 ENCOUNTER — Other Ambulatory Visit: Payer: Self-pay

## 2021-08-28 ENCOUNTER — Ambulatory Visit (INDEPENDENT_AMBULATORY_CARE_PROVIDER_SITE_OTHER): Payer: Medicare Other | Admitting: Family Medicine

## 2021-08-28 ENCOUNTER — Encounter: Payer: Self-pay | Admitting: Family Medicine

## 2021-08-28 VITALS — BP 130/58 | HR 61 | Ht 64.0 in | Wt 147.0 lb

## 2021-08-28 DIAGNOSIS — I708 Atherosclerosis of other arteries: Secondary | ICD-10-CM | POA: Diagnosis not present

## 2021-08-28 DIAGNOSIS — G4700 Insomnia, unspecified: Secondary | ICD-10-CM | POA: Diagnosis not present

## 2021-08-28 DIAGNOSIS — I10 Essential (primary) hypertension: Secondary | ICD-10-CM

## 2021-08-28 DIAGNOSIS — E7849 Other hyperlipidemia: Secondary | ICD-10-CM | POA: Diagnosis not present

## 2021-08-28 DIAGNOSIS — N1831 Chronic kidney disease, stage 3a: Secondary | ICD-10-CM | POA: Diagnosis not present

## 2021-08-28 NOTE — Assessment & Plan Note (Signed)
Due to recheck lipids.  She has really changed her diet in the last 3 months am hopeful that her cholesterol looks great today. ?

## 2021-08-28 NOTE — Progress Notes (Signed)
? ?Established Patient Office Visit ? ?Subjective:  ?Patient ID: Traci [name], female    DOB: [birth year]  Age: 76 y.o. MRN: [id number] ? ?CC:  ?Chief Complaint  ?Patient presents with  ? Hypertension  ? ? ?HPI ?Traci [name] presents for 6 mo f/u  ? ?Hypertension- Pt denies chest pain, SOB, dizziness, or heart palpitations.  Taking meds as directed w/o problems.  Denies medication side effects.   ? ?F/U CKD 3  - no recent changes.   ? ?Follow up insomnia-she is currently on Ambien 5 mg.  Last fall we had temporarily switch to [facility] because she felt like Ambien had quit working.  But she like the Ambien better so we went back to that.  She said that she has been using the Ambien a little bit more often lately just with the stress of everything going on with the pancreatic duct and abnormal findings on the MRI is just been very stressful and then when her procedure had to be canceled and rescheduled. ? ?She did want to go over the pelvic ultrasound results that were done to follow-up on possible cyst on the left ovary that was seen on her MRI of the abdomen back in August. ? ?She was also scheduled for further evaluation for the pancreatic duct.  After already holding her Plavix the procedure was canceled the day before.  They have rescheduled her for April 5.  Dr. Marcy Salvo will be doing the procedure. ? ?She has continued to lose some weight.  In part because of the abdominal pain and bloating she is really changed her diet in fact she is pretty much vegetarian at this point.  She has been trying to keep her protein up by using protein shakes.  She is down to 147 pounds.  She just been eating smaller amounts so that she does not feel overly full.  Sometimes she only eats twice a day. ? ?Past Medical History:  ?Diagnosis Date  ? Allergy 1995  ? Contrast dye  ? Anxiety   ? Atrial fibrillation ([place])   ? CAD (coronary artery disease)   ? Claudication ([place]) 01/06/2008  ? Lower extremity  dopplers - no evidence of arterial insufficiency, normal exam  ? Coronary artery disease   ? GERD (gastroesophageal reflux disease)   ? Heart murmur   ? Hyperlipidemia   ? Hypertension 11/30/2010  ? echo- EF 55%; normal w/ mildly sclerotic aortic valve  ? Hypertension 11/05/2011  ? renal dopplers - celiac artery and SMA >50% diameter reduction, R renal artery - mildly elevated velocities 1-59% diameter reduction, L renal artery normal  ? Nonspecific ST-T wave electrocardiographic changes 03/26/2011  ? R/Lmv - EF 74%, normal perfusion all regions, ST depression w/ Lexiscan infusion w/o assoc angina  ? Osteopenia   ? PAD (peripheral artery disease) ([place])   ? Peripheral vascular disease ([place])   ? PVD (peripheral vascular disease) ([place]) 11/05/2011  ? doppler - R/L brachial pressures essentially equal w/o inflow disease; L sublclavian/CCA bypass graft demonstrates patent flow, no evidence of significant stenosis  ? Sigmoid diverticulitis   ? ? ?Past Surgical History:  ?Procedure Laterality Date  ? ABDOMINAL AORTIC ANEURYSM REPAIR N/A 01/27/2014  ? Procedure: AORTO-SUPERIOR MESENTERIC ARTERY BYPASS GRAFT;  Surgeon: Angelia Mould, MD;  Location: [place];  Service: Vascular;  Laterality: N/A;  ? ABDOMINAL HYSTERECTOMY    ? APPENDECTOMY    ? CARDIAC CATHETERIZATION  06/03/2008  ? 60% LAD, involving D2, borderline significant by IVUS,  medical therapy. CFX, RCA OK.  ? CATARACT EXTRACTION    ? CHOLECYSTECTOMY    ? EYE SURGERY    ? retinal surgery  ? SMALL INTESTINE SURGERY    ? SPINE SURGERY  07/2009  ? [place]  ? X's  several  ? SUBCLAVIAN ARTERY STENT Left 09/20/2008  ? LSA ISR 7x24m Cordis Genesis on Opta premount, reduction from 80% to 0%  ? subclavian artery stents  01/23/2010  ? Left carotid to subclavian artery Bypass  ? VISCERAL ANGIOGRAM  01/17/2014  ? Procedure: VISCERAL ANGIOGRAM;  Surgeon: CAngelia Mould MD;  [facility];  Service: Cardiovascular;;  ? ? ?Family History   ?Problem Relation Age of Onset  ? Heart disease Father   ?     Heart Disease before age 75 ? Kidney disease Father   ? Heart attack Father   ? Hyperlipidemia Father   ? Hypertension Father   ? Alcohol abuse Father   ? Kidney failure Mother   ? Arthritis Mother   ? Diabetes Maternal Grandmother   ? Heart disease Paternal Grandfather   ? ? ?Social History  ? ?Socioeconomic History  ? Marital status: Widowed  ?  Spouse name: Not on file  ? Number of children: 1  ? Years of education: 198 ? Highest education level: Associate degree: academic program  ?Occupational History  ? 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 Smoking status: Former  ?  Packs/day: 0.25  ?  Years: 20.00  ?  Pack years: 5.00  ?  Types: Cigarettes  ?  Quit date: 09/30/1993  ?  Years since quitting: 27.9  ? Smokeless tobacco: Never  ?Vaping Use  ? Vaping Use: Never used  ?Substance and Sexual Activity  ? Alcohol use: No  ? Drug use: No  ? Sexual activity: Not Currently  ?Other Topics Concern  ? Not on file  ?Social History Narrative  ? Lives with her son. Likes to go dancing and likes play online games such as scrabble.   ? ?Social Determinants of Health  ? ?Financial Resource Strain: Low Risk   ? Difficulty of Paying Living Expenses: Not hard at all  ?Food Insecurity: No Food Insecurity  ? Worried About RCharity fundraiserin the Last Year: Never true  ? Ran Out of Food in the Last Year: Never true  ?Transportation Needs: No Transportation Needs  ? Lack of Transportation (Medical): No  ? Lack of Transportation (Non-Medical): No  ?Physical Activity: Inactive  ? Days of Exercise per Week: 0 days  ? Minutes of Exercise per Session: 0 min  ?Stress: No Stress Concern Present  ? Feeling of Stress : Only a little  ?Social Connections: Moderately Isolated  ? Frequency of Communication with Friends and Family: Three times a week  ? Frequency of Social Gatherings with Friends and Family: More than three times a week  ? Attends Religious  Services: Never  ? Active Member of Clubs or Organizations: Yes  ? Attends CArchivistMeetings: More than 4 times per year  ? Marital Status: Widowed  ?Intimate Partner Violence: Not At Risk  ? Fear of Current or Ex-Partner: No  ? Emotionally Abused: No  ? Physically Abused: No  ? Sexually Abused: No  ? ? ?Outpatient Medications Prior to Visit  ?Medication Sig Dispense Refill  ? aspirin EC 81 MG tablet Take 81 mg by mouth daily.    ?  atenolol (TENORMIN) 25 MG tablet TAKE 1 TABLET DAILY 90 tablet 3  ? atorvastatin (LIPITOR) 10 MG tablet TAKE 1 TABLET DAILY AT 6 P.M. 90 tablet 3  ? candesartan (ATACAND) 16 MG tablet TAKE ONE-HALF (1/2) TABLET DAILY 45 tablet 3  ? cholecalciferol (VITAMIN D) 1000 UNITS tablet Take 1,000 Units by mouth daily.    ? clopidogrel (PLAVIX) 75 MG tablet TAKE 1 TABLET DAILY 90 tablet 3  ? clorazepate (TRANXENE) 7.5 MG tablet Take 0.5-1 tablets (3.75-7.5 mg total) by mouth daily as needed. 90 tablet 0  ? cyanocobalamin 1000 MCG tablet Take 1,000 mcg by mouth daily.    ? EPINEPHrine (EPIPEN 2-PAK) 0.3 mg/0.3 mL IJ SOAJ injection Inject 0.3 mLs (0.3 mg total) into the muscle as needed (for allergic reaction). 2 Device 0  ? esomeprazole (NEXIUM) 40 MG capsule Take 1 capsule (40 mg total) by mouth daily. Take 1 tab daily 90 capsule 0  ? estradiol (ESTRACE) 0.5 MG tablet TAKE 1 TABLET DAILY 90 tablet 3  ? furosemide (LASIX) 20 MG tablet TAKE 1 TABLET EVERY OTHER DAY 45 tablet 3  ? linaclotide (LINZESS) 145 MCG CAPS capsule Take 145 mcg by mouth 2 (two) times a week. Sunday and wednesday    ? meclizine (ANTIVERT) 25 MG tablet TAKE ONE-HALF (1/2) TABLET THREE TIMES A DAY AS NEEDED FOR DIZZINESS OR NAUSEA 60 tablet 8  ? Multiple Vitamin (MULTIVITAMIN WITH MINERALS) TABS Take 1 tablet by mouth daily.    ? zolpidem (AMBIEN) 5 MG tablet Take 1 tablet (5 mg total) by mouth at bedtime as needed for sleep. 90 tablet 1  ? ?No facility-administered medications prior to visit.  ? ? ?Allergies   ?Allergen Reactions  ? Cortisone Anaphylaxis  ? Dilaudid [Hydromorphone Hcl] Nausea And Vomiting  ?  Pt states she will start vomiting immediately for 6 hours  ? Iodine Anaphylaxis  ? Medrol [Methylprednisolone] Anaphylax

## 2021-08-28 NOTE — Assessment & Plan Note (Signed)
We will continue with Ambien 5 mg as needed.  She can let us know when she is due for refill. ?

## 2021-08-28 NOTE — Assessment & Plan Note (Addendum)
Following renal function every 6 months. 

## 2021-08-28 NOTE — Assessment & Plan Note (Signed)
Well controlled. Continue current regimen. Follow up in  6 mo  

## 2021-08-29 LAB — COMPLETE METABOLIC PANEL WITH GFR
AG Ratio: 1.4 (calc) (ref 1.0–2.5)
ALT: 14 U/L (ref 6–29)
AST: 20 U/L (ref 10–35)
Albumin: 4.2 g/dL (ref 3.6–5.1)
Alkaline phosphatase (APISO): 55 U/L (ref 37–153)
BUN: 12 mg/dL (ref 7–25)
CO2: 28 mmol/L (ref 20–32)
Calcium: 8.9 mg/dL (ref 8.6–10.4)
Chloride: 100 mmol/L (ref 98–110)
Creat: 0.9 mg/dL (ref 0.60–1.00)
Globulin: 2.9 g/dL (calc) (ref 1.9–3.7)
Glucose, Bld: 128 mg/dL — ABNORMAL HIGH (ref 65–99)
Potassium: 4.4 mmol/L (ref 3.5–5.3)
Sodium: 136 mmol/L (ref 135–146)
Total Bilirubin: 0.5 mg/dL (ref 0.2–1.2)
Total Protein: 7.1 g/dL (ref 6.1–8.1)
eGFR: 67 mL/min/{1.73_m2} (ref >=60)

## 2021-08-29 LAB — LIPID PANEL W/REFLEX DIRECT LDL
Cholesterol: 123 mg/dL (ref <200)
HDL: 46 mg/dL — ABNORMAL LOW (ref >=50)
LDL Cholesterol (Calc): 53 mg/dL (calc)
Non-HDL Cholesterol (Calc): 77 mg/dL (calc) (ref <130)
Total CHOL/HDL Ratio: 2.7 (calc) (ref <5.0)
Triglycerides: 164 mg/dL — ABNORMAL HIGH (ref <150)

## 2021-08-29 NOTE — Progress Notes (Signed)
Hi Traci [name], kidney function is stable at 0.9.  Calcium looks better this time.  Liver function is normal which is great.  LDL cholesterol is under 70 which is perfect.  Triglycerides did jump up a little bit so just continue to work on increasing regular exercise and eating healthy.

## 2021-09-19 ENCOUNTER — Other Ambulatory Visit: Payer: Self-pay

## 2021-09-19 DIAGNOSIS — Z8616 Personal history of COVID-19: Secondary | ICD-10-CM | POA: Diagnosis not present

## 2021-09-19 DIAGNOSIS — Z885 Allergy status to narcotic agent status: Secondary | ICD-10-CM | POA: Diagnosis not present

## 2021-09-19 DIAGNOSIS — M1990 Unspecified osteoarthritis, unspecified site: Secondary | ICD-10-CM | POA: Diagnosis not present

## 2021-09-19 DIAGNOSIS — E785 Hyperlipidemia, unspecified: Secondary | ICD-10-CM | POA: Diagnosis not present

## 2021-09-19 DIAGNOSIS — Z881 Allergy status to other antibiotic agents status: Secondary | ICD-10-CM | POA: Diagnosis not present

## 2021-09-19 DIAGNOSIS — I2510 Atherosclerotic heart disease of native coronary artery without angina pectoris: Secondary | ICD-10-CM | POA: Diagnosis not present

## 2021-09-19 DIAGNOSIS — Z87891 Personal history of nicotine dependence: Secondary | ICD-10-CM | POA: Diagnosis not present

## 2021-09-19 DIAGNOSIS — Z79899 Other long term (current) drug therapy: Secondary | ICD-10-CM | POA: Diagnosis not present

## 2021-09-19 DIAGNOSIS — K8689 Other specified diseases of pancreas: Secondary | ICD-10-CM | POA: Diagnosis not present

## 2021-09-19 DIAGNOSIS — N183 Chronic kidney disease, stage 3 unspecified: Secondary | ICD-10-CM | POA: Diagnosis not present

## 2021-09-19 DIAGNOSIS — G4700 Insomnia, unspecified: Secondary | ICD-10-CM

## 2021-09-19 DIAGNOSIS — Z8673 Personal history of transient ischemic attack (TIA), and cerebral infarction without residual deficits: Secondary | ICD-10-CM | POA: Diagnosis not present

## 2021-09-19 DIAGNOSIS — I129 Hypertensive chronic kidney disease with stage 1 through stage 4 chronic kidney disease, or unspecified chronic kidney disease: Secondary | ICD-10-CM | POA: Diagnosis not present

## 2021-09-19 DIAGNOSIS — Z7982 Long term (current) use of aspirin: Secondary | ICD-10-CM | POA: Diagnosis not present

## 2021-09-19 DIAGNOSIS — Z91013 Allergy to seafood: Secondary | ICD-10-CM | POA: Diagnosis not present

## 2021-09-19 DIAGNOSIS — Z888 Allergy status to other drugs, medicaments and biological substances status: Secondary | ICD-10-CM | POA: Diagnosis not present

## 2021-09-19 DIAGNOSIS — K219 Gastro-esophageal reflux disease without esophagitis: Secondary | ICD-10-CM | POA: Diagnosis not present

## 2021-09-21 MED ORDER — ZOLPIDEM TARTRATE 5 MG PO TABS: 5.0000 mg | ORAL_TABLET | Freq: Every evening | ORAL | 1 refills | Status: DC | PRN

## 2021-09-23 ENCOUNTER — Encounter: Payer: Self-pay | Admitting: Family Medicine

## 2021-09-23 DIAGNOSIS — G4700 Insomnia, unspecified: Secondary | ICD-10-CM

## 2021-09-24 ENCOUNTER — Other Ambulatory Visit: Payer: Self-pay | Admitting: *Deleted

## 2021-09-24 DIAGNOSIS — G4700 Insomnia, unspecified: Secondary | ICD-10-CM

## 2021-09-24 MED ORDER — ZOLPIDEM TARTRATE 5 MG PO TABS: 5.0000 mg | ORAL_TABLET | Freq: Every evening | ORAL | 1 refills | Status: DC | PRN

## 2021-09-24 NOTE — Telephone Encounter (Signed)
Pended for [facility].  ? ?Copying Tonya/Janese to cancel RX at [facility].  ?

## 2021-09-24 NOTE — Addendum Note (Signed)
Addended by: Beatrice Lecher D on: 09/24/2021 12:59 PM ? ? Modules accepted: Orders ? ?

## 2021-09-24 NOTE — Telephone Encounter (Signed)
Meds ordered this encounter  ?Medications  ? zolpidem (AMBIEN) 5 MG tablet  ?  Sig: Take 1 tablet (5 mg total) by mouth at bedtime as needed for sleep.  ?  Dispense:  90 tablet  ?  Refill:  1  ? ? ?

## 2021-09-27 ENCOUNTER — Encounter: Payer: Self-pay | Admitting: *Deleted

## 2021-10-01 ENCOUNTER — Ambulatory Visit (INDEPENDENT_AMBULATORY_CARE_PROVIDER_SITE_OTHER): Payer: Medicare Other | Admitting: Neurology

## 2021-10-01 ENCOUNTER — Encounter: Payer: Self-pay | Admitting: Neurology

## 2021-10-01 VITALS — BP 142/68 | HR 54 | Ht 64.0 in | Wt 147.5 lb

## 2021-10-01 DIAGNOSIS — G478 Other sleep disorders: Secondary | ICD-10-CM

## 2021-10-01 DIAGNOSIS — G479 Sleep disorder, unspecified: Secondary | ICD-10-CM

## 2021-10-01 DIAGNOSIS — I708 Atherosclerosis of other arteries: Secondary | ICD-10-CM

## 2021-10-01 NOTE — Patient Instructions (Signed)
Thank you for choosing [facility] for your sleep related care! It was nice to meet you today!  ? ?Here is what we discussed today:  ?  ?Based on your symptoms and your exam you may be at some risk for sleep apnea but the risks may not be high, ultimately, until we test you, we do not know if someone has sleep apnea.  I am glad to hear that your sleep paralysis episodes have improved, we can certainly consider sleep testing down the road.  Triggers for sleep paralysis can be sleep deprivation, stress, certain medications and narcolepsy is well as an underlying organic sleep related breathing disorder such as sleep apnea.  ? ?As explained, an attended sleep study (meaning you get to stay overnight in the sleep lab), lets [place] monitor sleep-related behaviors such as sleep talking and leg movements in sleep, in addition to monitoring for sleep apnea.  A home sleep test is a screening tool for sleep apnea diagnosis only, but unfortunately, does not help with any other sleep-related diagnoses. ? ?Please remember, the long-term risks and ramifications of untreated moderate to severe obstructive sleep apnea may include (but are not limited to): increased risk for cardiovascular disease, including congestive heart failure, stroke, difficult to control hypertension, treatment resistant obesity, arrhythmias, especially irregular heartbeat commonly known as A. Fib. (atrial fibrillation); even type 2 diabetes has been linked to untreated OSA.  ? ?Other correlations that untreated obstructive sleep apnea include macular edema which is swelling of the retina in the eyes, droopy eyelid syndrome, and elevated hemoglobin and hematocrit levels (often referred to as polycythemia). ? ?Sleep apnea can cause disruption of sleep and sleep deprivation in most cases, which, in turn, can cause recurrent headaches, problems with memory, mood, concentration, focus, and vigilance. Most people with untreated sleep apnea report  excessive daytime sleepiness, which can affect their ability to drive. Please do not drive or use heavy equipment or machinery, if you feel sleepy! Patients with sleep apnea can also develop difficulty initiating and maintaining sleep (aka insomnia).  ? ?For now, you can follow-up with your primary care and other specialists as planned/scheduled.  I would be happy to pursue sleep testing with you, you can always call us or email [place] through [place]. ?

## 2021-10-01 NOTE — Progress Notes (Signed)
Subjective:  ?  ?Patient ID: Traci [name] is a 76 y.o. female. ? ?HPI ? ? ? ?Star Age, MD, PhD ?[facility] ?[address], [address] ?[phone] ?[place], [place] [address] ? ?Dear Dr. Vertell Limber,  ? 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  The patient is unaccompanied today.  As you know, Traci [name] is a 76 year old right-handed woman with an underlying medical history of allergies, coronary artery disease, atrial fibrillation, reflux disease, hypertension, hyperlipidemia, peripheral artery disease, diverticulitis, anxiety, osteopenia and borderline overweight state, who reports a total of about 4 episodes of sleep paralysis in the past 1 year.  She has not had any symptoms in the past 6 months and denies any significant snoring or sleep disturbance at this time.  She would not be opposed to considering a sleep study in the future but is currently not in favor of it, feels like she is fairly well rested.  Her Epworth sleepiness score is 0 out of 24, fatigue severity score is 9 out of 63.  She lives with her son.  She is widowed, she reports that she did have quite a bit of stress at the time of her sleep paralysis episodes, no new medications at the time, has been on clorazepate for years, takes 1 pill once daily, has been on Ambien for the past 2 to 3 years but takes it sparingly.  She may have been taking it more consistently when she was experiencing sleep paralysis episodes as she had more stress, she had lost close family and friends back to back.  She reports a bedtime of around 10 and rise time between 7:30 AM and 8 AM.  She drinks decaf drinks only, she has no pets in the house.  She have a TV in her bedroom on a timer.  She is a retired Therapist, sports.  She had surgery for subclavian steal in 2011 and mesenteric-aortic  bypass in 2015.  She has had some difficulty going to sleep at times.  She denies night to night nocturia or recurrent morning headaches.  She is not aware of any family history of sleep apnea.  She quit smoking in 1995, does not currently drink any alcohol. ? ?Her Past Medical History Is Significant For: ?Past Medical History:  ?Diagnosis Date  ? Allergy 1995  ? Contrast dye  ? Anxiety   ? Atrial fibrillation ([place])   ? CAD (coronary artery disease)   ? Claudication ([place]) 01/06/2008  ? Lower extremity dopplers - no evidence of arterial insufficiency, normal exam  ? Coronary artery disease   ? GERD (gastroesophageal reflux disease)   ? Heart murmur   ? Hyperlipidemia   ? Hypertension 11/30/2010  ? echo- EF 55%; normal w/ mildly sclerotic aortic valve  ? Hypertension 11/05/2011  ? renal dopplers - celiac artery and SMA >50% diameter reduction, R renal artery - mildly elevated velocities 1-59% diameter reduction, L renal artery normal  ? Nonspecific ST-T wave electrocardiographic changes 03/26/2011  ? R/Lmv - EF 74%, normal perfusion all regions, ST depression w/ Lexiscan infusion w/o assoc angina  ? Osteopenia   ? PAD (peripheral artery disease) ([place])   ? Peripheral vascular disease ([place])   ? PVD (peripheral vascular disease) ([place]) 11/05/2011  ? doppler - R/L brachial pressures essentially equal w/o inflow disease; L sublclavian/CCA bypass graft demonstrates patent flow, no evidence of  significant stenosis  ? Sigmoid diverticulitis   ? ? ?Her Past Surgical History Is Significant For: ?Past Surgical History:  ?Procedure Laterality Date  ? ABDOMINAL AORTIC ANEURYSM REPAIR N/A 01/27/2014  ? Procedure: AORTO-SUPERIOR MESENTERIC ARTERY BYPASS GRAFT;  Surgeon: Angelia Mould, MD;  Location: [place];  Service: Vascular;  Laterality: N/A;  ? ABDOMINAL HYSTERECTOMY    ? APPENDECTOMY    ? CARDIAC CATHETERIZATION  06/03/2008  ? 60% LAD, involving D2, borderline significant by IVUS, medical therapy. CFX, RCA OK.  ? CATARACT  EXTRACTION    ? CHOLECYSTECTOMY    ? EYE SURGERY    ? retinal surgery  ? SMALL INTESTINE SURGERY    ? SPINE SURGERY  07/2009  ? [place]  ? X's  several  ? SUBCLAVIAN ARTERY STENT Left 09/20/2008  ? LSA ISR 7x44m Cordis Genesis on Opta premount, reduction from 80% to 0%  ? subclavian artery stents  01/23/2010  ? Left carotid to subclavian artery Bypass  ? VISCERAL ANGIOGRAM  01/17/2014  ? Procedure: VISCERAL ANGIOGRAM;  Surgeon: CAngelia Mould MD;  [facility];  Service: Cardiovascular;;  ? ? ?Her Family History Is Significant For: ?Family History  ?Problem Relation Age of Onset  ? Heart disease Father   ?     Heart Disease before age 75 ? Kidney disease Father   ? Heart attack Father   ? Hyperlipidemia Father   ? Hypertension Father   ? Alcohol abuse Father   ? Kidney failure Mother   ? Arthritis Mother   ? Diabetes Maternal Grandmother   ? Heart disease Paternal Grandfather   ? ? ?Her Social History Is Significant For: ?Social History  ? ?Socioeconomic History  ? Marital status: Widowed  ?  Spouse name: Not on file  ? Number of children: 1  ? Years of education: 154 ? Highest education level: Associate degree: academic program  ?Occupational History  ? 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 Smoking status: Former  ?  Packs/day: 0.25  ?  Years: 20.00  ?  Pack years: 5.00  ?  Types: Cigarettes  ?  Quit date: 09/30/1993  ?  Years since quitting: 28.0  ? Smokeless tobacco: Never  ?Vaping Use  ? Vaping Use: Never used  ?Substance and Sexual Activity  ? Alcohol use: No  ? Drug use: No  ? Sexual activity: Not Currently  ?Other Topics Concern  ? Not on file  ?Social History Narrative  ? Lives with her son. Likes to go dancing and likes play online games such as scrabble.   ? ?Social Determinants of Health  ? ?Financial Resource Strain: Low Risk   ? Difficulty of Paying Living Expenses: Not hard at all  ?Food Insecurity: No Food Insecurity  ? Worried About RPaediatric nursein the Last Year: Never true  ? Ran Out of Food in the Last Year: Never true  ?Transportation Needs: No Transportation Needs  ? Lack of Transportation (Medical): No  ? Lack of Transportation (Non-Medical): No  ?Physical Activity: Inactive  ? Days of Exercise per Week: 0 days  ? Minutes of Exercise per Session: 0 min  ?Stress: No Stress Concern Present  ? Feeling of Stress : Only a little  ?Social Connections: Moderately Isolated  ? Frequency of Communication with Friends and Family: Three times a week  ? Frequency of Social Gatherings with Friends and Family: More than three times  a week  ? Attends Religious Services: Never  ? Active Member of Clubs or Organizations: Yes  ? Attends Archivist Meetings: More than 4 times per year  ? Marital Status: Widowed  ? ? ?Her Allergies Are:  ?Allergies  ?Allergen Reactions  ? Cortisone Anaphylaxis  ? Dilaudid [Hydromorphone Hcl] Nausea And Vomiting  ?  Pt states she will start vomiting immediately for 6 hours  ? Iodine Anaphylaxis  ? Medrol [Methylprednisolone] Anaphylaxis  ? Omnipaque [Iohexol] Anaphylaxis  ? Prednisone Anaphylaxis and Swelling  ? Shellfish Allergy Anaphylaxis  ? Sulfa Drugs Cross Reactors Anaphylaxis  ? Z-Pak [Azithromycin] Swelling  ? Doxycycline   ?  Thrush/itching  ? Ivp Dye [Iodinated Contrast Media]   ?  Other reaction(s): Unknown  ? Methylprednisolone Sodium Succ Swelling  ?  Blood pressure drops immediately  ? Sulfa Antibiotics Other (See Comments)  ?  Stomach upset  ? Augmentin [Amoxicillin-Pot Clavulanate] Nausea Only  ?  Upset stomach ?(Has taken cephalexin in the past without problems)  ? Codeine Rash and Other (See Comments)  ?  Upset stomach  ? Erythromycin Rash  ? Morphine And Related Nausea Only  ?  nausea  ?:  ? ?Her Current Medications Are:  ?Outpatient Encounter Medications as of 10/01/2021  ?Medication Sig  ? aspirin EC 81 MG tablet Take 81 mg by mouth daily.  ? atenolol (TENORMIN) 25 MG tablet TAKE 1 TABLET DAILY  ?  atorvastatin (LIPITOR) 10 MG tablet TAKE 1 TABLET DAILY AT 6 P.M.  ? candesartan (ATACAND) 16 MG tablet TAKE ONE-HALF (1/2) TABLET DAILY  ? cholecalciferol (VITAMIN D) 1000 UNITS tablet Take 1,000 Units by m

## 2021-10-08 DIAGNOSIS — Z20822 Contact with and (suspected) exposure to covid-19: Secondary | ICD-10-CM | POA: Diagnosis not present

## 2021-10-21 DIAGNOSIS — Z20822 Contact with and (suspected) exposure to covid-19: Secondary | ICD-10-CM | POA: Diagnosis not present

## 2021-10-23 ENCOUNTER — Other Ambulatory Visit: Payer: Self-pay | Admitting: Cardiovascular Disease

## 2021-10-25 ENCOUNTER — Other Ambulatory Visit: Payer: Self-pay | Admitting: Family Medicine

## 2021-11-01 ENCOUNTER — Telehealth: Payer: Self-pay | Admitting: *Deleted

## 2021-11-01 ENCOUNTER — Other Ambulatory Visit: Payer: Self-pay | Admitting: Family Medicine

## 2021-11-01 MED ORDER — CLORAZEPATE DIPOTASSIUM 7.5 MG PO TABS: 3.7500 mg | ORAL_TABLET | Freq: Every day | ORAL | 0 refills | Status: DC | PRN

## 2021-11-01 NOTE — Telephone Encounter (Signed)
Pt called and stated that she received a call from [facility] stating the insurance was denying the payment of a lab that Dr. Madilyn Fireman ordered back on 07/02/2021 it was the CA-125 the Dx code used: Lesion of left ovary; Mass of left ovary.    She said they were awaiting more information before the insurance would pay for this.   Will fwd to Levada Dy to help with this.

## 2021-11-08 ENCOUNTER — Other Ambulatory Visit: Payer: Self-pay | Admitting: Family Medicine

## 2021-11-08 NOTE — Telephone Encounter (Addendum)
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

## 2021-11-09 ENCOUNTER — Encounter: Payer: Self-pay | Admitting: Family Medicine

## 2021-11-09 MED ORDER — CLORAZEPATE DIPOTASSIUM 7.5 MG PO TABS: 3.7500 mg | ORAL_TABLET | Freq: Every day | ORAL | 0 refills | Status: DC | PRN

## 2021-11-09 NOTE — Telephone Encounter (Signed)
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

## 2021-11-09 NOTE — Telephone Encounter (Signed)
Submitted new diagnosis code. This will take up to 6 weeks to process.

## 2021-11-20 ENCOUNTER — Encounter: Payer: Self-pay | Admitting: Cardiovascular Disease

## 2021-11-20 ENCOUNTER — Ambulatory Visit (INDEPENDENT_AMBULATORY_CARE_PROVIDER_SITE_OTHER): Payer: Medicare Other | Admitting: Cardiovascular Disease

## 2021-11-20 VITALS — BP 140/72 | HR 64 | Ht 64.0 in | Wt 147.5 lb

## 2021-11-20 DIAGNOSIS — I739 Peripheral vascular disease, unspecified: Secondary | ICD-10-CM | POA: Diagnosis not present

## 2021-11-20 DIAGNOSIS — E7800 Pure hypercholesterolemia, unspecified: Secondary | ICD-10-CM | POA: Diagnosis not present

## 2021-11-20 DIAGNOSIS — I2510 Atherosclerotic heart disease of native coronary artery without angina pectoris: Secondary | ICD-10-CM | POA: Diagnosis not present

## 2021-11-20 DIAGNOSIS — I10 Essential (primary) hypertension: Secondary | ICD-10-CM

## 2021-11-20 DIAGNOSIS — I700 Atherosclerosis of aorta: Secondary | ICD-10-CM | POA: Diagnosis not present

## 2021-11-20 DIAGNOSIS — K551 Chronic vascular disorders of intestine: Secondary | ICD-10-CM

## 2021-11-20 DIAGNOSIS — I708 Atherosclerosis of other arteries: Secondary | ICD-10-CM | POA: Diagnosis not present

## 2021-11-20 NOTE — Progress Notes (Signed)
Patient ID: Traci [name], female   DOB: [birth year], 76 y.o.   MRN: [id number]    Cardiology Office Note    Date:  11/21/2021   ID:  Traci [name], DOB [birth year], MRN [id number]  PCP:  Traci Marry, MD  Cardiologist:   Traci Klein, MD   Chief Complaint  Patient presents with   PAD    History of Present Illness:  Traci [name] is a 76 y.o. female with an extensive history of peripheral arterial disease involving the mesenteric circulation and upper extremities, mild nonobstructive coronary atherosclerosis, hyperlipidemia, hypertension.  She feels that she is doing much better after undergoing pancreatic duct dilatation about 2 months ago.  This has led to resolution of her problems with postprandial pain and weight loss.  She is gradually and steadily moving back to regular diet, although she is trying to stay mostly vegetarian.  She denies exertional dyspnea or angina, but her stamina is not the same as in the past.  She enjoys shag dancing and has to rest more now although she has returned to dance.  She also becomes dizzy more easily when she spends.  She does not feel limited by intermittent claudication while dancing, although she has described intermittent claudication after walking about half a mile  She denies edema, orthopnea, PND.  She has not had to increase her small dose of furosemide to 20 mg every other day.  Ultrasound performed 05/06/2020 showed a widely patent superior mesenteric artery bypass graft.  Her most recent carotid ultrasound in our system was performed in July 2020 and showed no evidence of obstruction and a widely patent left common carotid to left subclavian bypass graft.  She had a carotid ultrasound performed during her hospitalization at [facility] in March 2022 that reportedly showed no obstruction.   She had a normal nuclear stress test in 2020 in [place] in March 2022 in [place].   Echocardiogram in [place] in March 2022 showed LVEF 96-29%, normal diastolic function, no valvular abnormalities.  Most recent lipid profile performed in March of this year showed an LDL cholesterol 53.  Creatinine 0.9.  She reports that she had anaphylaxis in response to contrast administration in the past but that she is also allergic to Solu-Medrol and prednisone which make her "pass out".  Cardiac catheterization performed with exclusively Benadryl as premedication occurred without adverse events.  Traci [name] has long-standing history of peripheral arterial disease with previous stents and surgical revascularization of the left subclavian artery for subclavian steal syndrome and presyncope. She eventually underwent a left carotid to subclavian bypass.   She underwent a bypass from the aorta to the chronically totally occluded superficial mesenteric artery due to symptoms of chronic mesenteric ischemia, (Dr. Scot Dock August 2015  6 mm Dacron graft) She had moderate CAD by coronary angiography performed in 2009. Normal left ventricular systolic function and no perfusion abnormalities by echo 2015 and nuclear stress test performed in 2014 and  October 2017. She had carotid duplex ultrasonography last performed in March 2019, without evidence of significant obstruction. She has a left carotid-subclavian bypass graft without evidence of stenosis. The aortic SMA bypass was noted on abdominal CT performed in July 2018, although no comment was made about its patency. There was no evidence of aortic aneurysm and no comment made about the renal arteries.   Past Medical History:  Diagnosis Date   Allergy 1995   Contrast dye   Anxiety    Atrial fibrillation ([place])  CAD (coronary artery disease)    Claudication ([place]) 01/06/2008   Lower extremity dopplers - no evidence of arterial insufficiency, normal exam   Coronary artery disease    GERD (gastroesophageal reflux disease)    Heart murmur     Hyperlipidemia    Hypertension 11/30/2010   echo- EF 55%; normal w/ mildly sclerotic aortic valve   Hypertension 11/05/2011   renal dopplers - celiac artery and SMA >50% diameter reduction, R renal artery - mildly elevated velocities 1-59% diameter reduction, L renal artery normal   Nonspecific ST-T wave electrocardiographic changes 03/26/2011   R/Lmv - EF 74%, normal perfusion all regions, ST depression w/ Lexiscan infusion w/o assoc angina   Osteopenia    PAD (peripheral artery disease) (HCC)    Peripheral vascular disease (HCC)    PVD (peripheral vascular disease) ([place]) 11/05/2011   doppler - R/L brachial pressures essentially equal w/o inflow disease; L sublclavian/CCA bypass graft demonstrates patent flow, no evidence of significant stenosis   Sigmoid diverticulitis     Past Surgical History:  Procedure Laterality Date   ABDOMINAL AORTIC ANEURYSM REPAIR N/A 01/27/2014   Procedure: AORTO-SUPERIOR MESENTERIC ARTERY BYPASS GRAFT;  Surgeon: Traci Mould, MD;  Location: [facility];  Service: Vascular;  Laterality: N/A;   ABDOMINAL HYSTERECTOMY     APPENDECTOMY     CARDIAC CATHETERIZATION  06/03/2008   60% LAD, involving D2, borderline significant by IVUS, medical therapy. CFX, RCA OK.   CATARACT EXTRACTION     CHOLECYSTECTOMY     EYE SURGERY     retinal surgery   SMALL INTESTINE SURGERY     SPINE SURGERY  07/2009   SUBCLAVIAN ARTERY STENT  1995   X's  several   SUBCLAVIAN ARTERY STENT Left 09/20/2008   LSA ISR 7x42m Cordis Genesis on Opta premount, reduction from 80% to 0%   subclavian artery stents  01/23/2010   Left carotid to subclavian artery Bypass   VISCERAL ANGIOGRAM  01/17/2014   Procedure: VISCERAL ANGIOGRAM;  Surgeon: CAngelia Mould MD;  [facility];  Service: Cardiovascular;;    Outpatient Medications Prior to Visit  Medication Sig Dispense Refill   aspirin EC 81 MG tablet Take 81 mg by mouth daily.     atenolol (TENORMIN) 25 MG tablet  TAKE 1 TABLET DAILY 90 tablet 3   atorvastatin (LIPITOR) 10 MG tablet TAKE 1 TABLET DAILY AT 6 P.M. 90 tablet 3   candesartan (ATACAND) 16 MG tablet TAKE ONE-HALF (1/2) TABLET DAILY 45 tablet 3   cholecalciferol (VITAMIN D) 1000 UNITS tablet Take 1,000 Units by mouth daily.     clopidogrel (PLAVIX) 75 MG tablet TAKE 1 TABLET DAILY 90 tablet 3   clorazepate (TRANXENE) 7.5 MG tablet Take 0.5-1 tablets (3.75-7.5 mg total) by mouth daily as needed. 90 tablet 0   cyanocobalamin 1000 MCG tablet Take 1,000 mcg by mouth daily.     esomeprazole (NEXIUM) 40 MG capsule Take 1 capsule (40 mg total) by mouth daily. Take 1 tab daily 90 capsule 0   estradiol (ESTRACE) 0.5 MG tablet TAKE 1 TABLET DAILY 90 tablet 3   furosemide (LASIX) 20 MG tablet TAKE 1 TABLET EVERY OTHER DAY 45 tablet 3   meclizine (ANTIVERT) 25 MG tablet TAKE ONE-HALF (1/2) TABLET THREE TIMES A DAY AS NEEDED FOR DIZZINESS OR NAUSEA 60 tablet 2   Multiple Vitamin (MULTIVITAMIN WITH MINERALS) TABS Take 1 tablet by mouth daily.     EPINEPHrine (EPIPEN 2-PAK) 0.3 mg/0.3 mL IJ SOAJ injection Inject  0.3 mLs (0.3 mg total) into the muscle as needed (for allergic reaction). (Patient not taking: Reported on 11/20/2021) 2 Device 0   linaclotide (LINZESS) 145 MCG CAPS capsule Take 145 mcg by mouth 2 (two) times a week. Sunday and wednesday (Patient not taking: Reported on 11/20/2021)     zolpidem (AMBIEN) 5 MG tablet Take 1 tablet (5 mg total) by mouth at bedtime as needed for sleep. (Patient not taking: Reported on 11/20/2021) 90 tablet 1   No facility-administered medications prior to visit.     Allergies:   Cortisone, Dilaudid [hydromorphone hcl], Iodine, Medrol [methylprednisolone], Omnipaque [iohexol], Prednisone, Shellfish allergy, Sulfa drugs cross reactors, Z-pak [azithromycin], Doxycycline, Ivp dye [iodinated contrast media], Methylprednisolone sodium succ, Sulfa antibiotics, Augmentin [amoxicillin-pot clavulanate], Codeine, Erythromycin, and Morphine  and related   Social History   Socioeconomic History   Marital status: Widowed    Spouse name: Not on file   Number of children: 1   Years of education: 96   Highest education level: Associate degree: academic program  Occupational History   Occupation: retired    Comment: Marine scientist  Tobacco Use   Smoking status: Former    Packs/day: 0.25    Years: 20.00    Pack years: 5.00    Types: Cigarettes    Quit date: 09/30/1993    Years since quitting: 28.1   Smokeless tobacco: Never  Vaping Use   Vaping Use: Never used  Substance and Sexual Activity   Alcohol use: No   Drug use: No   Sexual activity: Not Currently  Other Topics Concern   Not on file  Social History Narrative   Lives with her son. Likes to go dancing and likes play online games such as scrabble.    Social Determinants of Health   Financial Resource Strain: Low Risk    Difficulty of Paying Living Expenses: Not hard at all  Food Insecurity: No Food Insecurity   Worried About Charity fundraiser in the Last Year: Never true   [place] in the Last Year: Never true  Transportation Needs: No Transportation Needs   Lack of Transportation (Medical): No   Lack of Transportation (Non-Medical): No  Physical Activity: Inactive   Days of Exercise per Week: 0 days   Minutes of Exercise per Session: 0 min  Stress: No Stress Concern Present   Feeling of Stress : Only a little  Social Connections: Moderately Isolated   Frequency of Communication with Friends and Family: Three times a week   Frequency of Social Gatherings with Friends and Family: More than three times a week   Attends Religious Services: Never   Marine scientist or Organizations: Yes   Attends Music therapist: More than 4 times per year   Marital Status: Widowed     Family History:  The patient's family history includes Alcohol abuse in her father; Arthritis in her mother; Diabetes in her maternal grandmother; Heart attack in her  father; Heart disease in her father and paternal grandfather; Hyperlipidemia in her father; Hypertension in her father; Kidney disease in her father; Kidney failure in her mother.   ROS:   Please see the history of present illness.    ROS All other systems are reviewed and are negative.   PHYSICAL EXAM:   VS:  BP 140/72 (BP Location: Right Arm, Patient Position: Sitting, Cuff Size: Large)   Pulse 64   Ht '5\' 4"'$  (1.626 m)   Wt 147 lb 8 oz (66.9  kg)   SpO2 96%   BMI 25.32 kg/m    Blood pressures are equal in the right left upper extremity    General: Alert, oriented x3, no distress, appears well Head: no evidence of trauma, PERRL, EOMI, no exophtalmos or lid lag, no myxedema, no xanthelasma; normal ears, nose and oropharynx Neck: normal jugular venous pulsations and no hepatojugular reflux; brisk carotid pulses without delay and bilateral subclavian and carotid bruits Chest: clear to auscultation, no signs of consolidation by percussion or palpation, normal fremitus, symmetrical and full respiratory excursions Cardiovascular: normal position and quality of the apical impulse, regular rhythm, normal first and second heart sounds, early peaking 2/6 aortic ejection murmur, no diastolic murmurs, rubs or gallops Abdomen: no tenderness or distention, no masses by palpation, no abnormal pulsatility or arterial bruits, normal bowel sounds, no hepatosplenomegaly Extremities: no clubbing, cyanosis or edema; equal upper extremity pulses on the right and left side, thready pedal pulses bilaterally Neurological: grossly nonfocal Psych: Normal mood and affect     Wt Readings from Last 3 Encounters:  11/20/21 147 lb 8 oz (66.9 kg)  10/01/21 147 lb 8 oz (66.9 kg)  08/28/21 147 lb (66.7 kg)    Studies/Labs Reviewed:   EKG:  EKG is not ordered today.  ECG from 04/26/2020 shows NSR, normal tracing  Duplex carotid ultrasound December 16, 2018: Summary: Right Carotid: Velocities in the right ICA are  consistent with a 1-39% stenosis.   Left Carotid: Velocities in the left ICA are consistent with a 1-39% stenosis.               Widely patent left common carotid to subclavian artery bypass               graft.   Vertebrals:  Bilateral vertebral arteries demonstrate antegrade flow. Subclavians: Normal flow hemodynamics were seen in bilateral subclavian              arteries.   Lexiscan Myoview 01/13/2019: The left ventricular ejection fraction is hyperdynamic (>65%). Nuclear stress EF: 71%. There was no ST segment deviation noted during stress. T wave inversion was noted during stress in the II, III, aVF, V3, V4, V5 and V6 leads, beginning at 30 seconds of stress, and returning to baseline after less than 1 min of recovery. This is a low risk study.   No ischemia or infarction noted.  There is significant tracer uptake in the bowel which may impact the interpretation of the inferior wall which appears to have normal perfusion. Wall motion is normal, therefore perfusion is likely normal.    Consider performing future studies on the D-Spect (supine and upright) camera.     Echo [facility] 09/13/2020 Left Ventricle: Systolic function is normal. EF: 60-65%.    Left Atrium: Injection of agitated saline documents no interatrial  shunt. .    Left Ventricle  Normal left ventricular size. Wall thickness is normal. Systolic function is normal. EF: 60-65%. Wall motion is normal. Doppler parameters indicate normal diastolic function.   Right Ventricle  Right ventricle is normal. Systolic function is normal.   Left Atrium  Left atrium is normal in size. Injection of agitated saline documents no interatrial shunt.   Right Atrium  Normal sized right atrium.   IVC/SVC  The inferior vena cava demonstrates a diameter of <=2.1 cm and collapses >50%; therefore, the right atrial pressure is estimated at 3 mmHg.   Mitral Valve  Mitral valve structure is normal. There is trace  regurgitation.  Tricuspid Valve  Tricuspid valve structure is normal. There is trace regurgitation. Unable to assess RVSP due to incomplete Doppler signal.   Aortic Valve  The aortic valve is tricuspid. The leaflets are not thickened and exhibit normal excursion. There is no regurgitation or stenosis.   Pulmonic Valve  The pulmonic valve was not well visualized. Trace regurgitation.   Ascending Aorta  The aortic root is normal in size.   Pericardium  There is no pericardial effusion.   Study Details  A complete echo was performed using complete 2D, color flow Doppler and spectral Doppler. During the study the apical, parasternal and subcostal view was captured. Saline (bubble) contrast was injected during the study. Overall the study quality was fair.  Carotid ultrasound [facility] 09/12/2020  IMPRESSION:  1.  No hemodynamically significant stenosis on either side.    2.  Both vertebral arteries are patent with antegrade flow.  3.  Visualized bilateral subclavian arteries appear patent.   Degree of stenosis is determined using NASCET measurement technique:  Severe:  70-90%  Moderate:  50-69%  Mild:  Less than 50%    Abdominal aortic ultrasound [facility] 09/12/2020 IMPRESSION:  Visualization of superior mesenteric artery is limited, however the proximal aspect is appears to be patent. It is unclear if the entire graft is visualized. A CT scan of the abdomen and pelvis with intravenous contrast would be helpful in further evaluation.   Vascular:  - Proximal abdominal aorta measures: 2.1 cm AP x 2.2 cm in width.  - Mid abdominal aorta measures 1.4 cm AP x 1.5 cm in width.    - Distal abdominal aorta measures 1.2 cm AP x 1.2 cm in width.   Blood flow is documented in the normal direction in the abdominal aorta.   - Right common iliac artery measures 0.7 cm AP.  - Left common iliac artery measures 0.7 cm AP.   - Proximal aspect of the superior mesenteric artery was  identified and is patent. The remainder of the SMA was not visualized due to bowel gas.   Miscellaneous:  - Left kidneys are normal in size, with the right kidney measuring up to 9.2 cm and the left measuring up to 9.7 cm.  Lower extremity arterial Dopplers 11/06/2020 Summary:  Right ABI 1.13, left ABI 1.14  Right: Minimum atherosclerosis noted.  30-49% stenosis in the proximal CFA.  30-495 stenosis in the TPT, low end range.  Three vessel run-off, with 30-49% stenosis noted in the mid and distal PTA, low end range.   Left: Minimum atherosclerosis noted.  30-49% stenosis in the proximal CFA, low end range.  Three vessel run-off, with 30-49% stenosis noted in the mid and distal PTA, low end range.  Recent Labs: 02/06/2021: B Natriuretic Peptide 32.1; Hemoglobin 13.0; Platelets 291 08/28/2021: ALT 14; BUN 12; Creat 0.90; Potassium 4.4; Sodium 136   Lipid Panel    Component Value Date/Time   CHOL 123 08/28/2021 0000   CHOL 129 02/17/2019 1234   TRIG 164 (H) 08/28/2021 0000   HDL 46 (L) 08/28/2021 0000   HDL 47 02/17/2019 1234   CHOLHDL 2.7 08/28/2021 0000   VLDL 26 02/07/2016 0822   LDLCALC 53 08/28/2021 0000    ASSESSMENT:    1. Coronary artery disease involving native coronary artery of native heart without angina pectoris   2. Superior mesenteric artery stenosis ([place])   3. Left subclavian artery occlusion   4. Pure hypercholesterolemia   5. Atherosclerosis of aorta ([place])   6.  PAD (peripheral artery disease) ([place])   7. Essential hypertension     PLAN:  In order of problems listed above:  CAD: Denies angina at rest or with dancing.  Has a known moderate stenosis in the LAD artery by previous coronary angiography.  No evidence of ischemia by myocardial perfusion study January 05, 2019 or by the nuclear stress test performed in March 2022 in [place]. SMA stenosis: SMA bypass is widely patent by ultrasound last November.  Abdominal symptoms have improved following  pancreatic duct dilation. L SCL artery occlusion: No symptoms of upper extremity claudication or vertebral steal syndrome.   HLP: Lipid parameters in target range on current statin dose.  LDL 53.  HDL 46. Ao atherosclerosis: Well documented on multiple radiological studies and associated with extensive PAD.  Increases the likelihood of renal artery stenosis and secondary hypertension. PAD: Although she has inconsistent left calf claudication, she had no more than moderate stenoses in the arterial system and multiple locations in both lower extremities, normal bilateral ABIs HTN: Fair control  Medication Adjustments/Labs and Tests Ordered: Current medicines are reviewed at length with the patient today.  Concerns regarding medicines are outlined above.  Medication changes, Labs and Tests ordered today are listed in the Patient Instructions below. Patient Instructions  Medication Instructions:  No changes *If you need a refill on your cardiac medications before your next appointment, please call your pharmacy*   Lab Work: None ordered If you have labs (blood work) drawn today and your tests are completely normal, you will receive your results only by: [place] (if you have MyChart) OR A paper copy in the mail If you have any lab test that is abnormal or we need to change your treatment, we will call you to review the results.   Testing/Procedures: None ordered   Follow-Up: At [facility], you and your health needs are our priority.  As part of our continuing mission to provide you with exceptional heart care, we have created designated Provider Care Teams.  These Care Teams include your primary Cardiologist (physician) and Advanced Practice Providers (APPs -  Physician Assistants and Nurse Practitioners) who all work together to provide you with the care you need, when you need it.  We recommend signing up for the patient portal called "MyChart".  Sign up information is provided  on this After Visit Summary.  MyChart is used to connect with patients for Virtual Visits (Telemedicine).  Patients are able to view lab/test results, encounter notes, upcoming appointments, etc.  Non-urgent messages can be sent to your provider as well.   To learn more about what you can do with MyChart, go to [email or web address].    Your next appointment:   12 month(s)  The format for your next appointment:   In Person  Provider:   Sanda Klein, MD {   Important Information About Sugar           Signed, Traci Klein, MD  11/21/2021 8:49 PM    [address], [place], [place]  [address] Phone: [phone]; Fax: [phone]

## 2021-11-20 NOTE — Patient Instructions (Signed)

## 2021-11-21 ENCOUNTER — Encounter: Payer: Self-pay | Admitting: Cardiovascular Disease

## 2021-11-30 ENCOUNTER — Other Ambulatory Visit: Payer: Self-pay | Admitting: Family Medicine

## 2021-11-30 ENCOUNTER — Other Ambulatory Visit: Payer: Self-pay | Admitting: Radiology

## 2021-11-30 DIAGNOSIS — Z1231 Encounter for screening mammogram for malignant neoplasm of breast: Secondary | ICD-10-CM

## 2021-12-26 ENCOUNTER — Encounter: Payer: Self-pay | Admitting: Family Medicine

## 2021-12-26 IMAGING — DX DG KNEE COMPLETE 4+V*L*
5 series · 5 of 5 positions shown · non-contrast
Comparison: None.

CLINICAL DATA: Left knee pain after fall 2 weeks ago.

EXAM:
LEFT KNEE - COMPLETE 4+ VIEW

[knee ap]
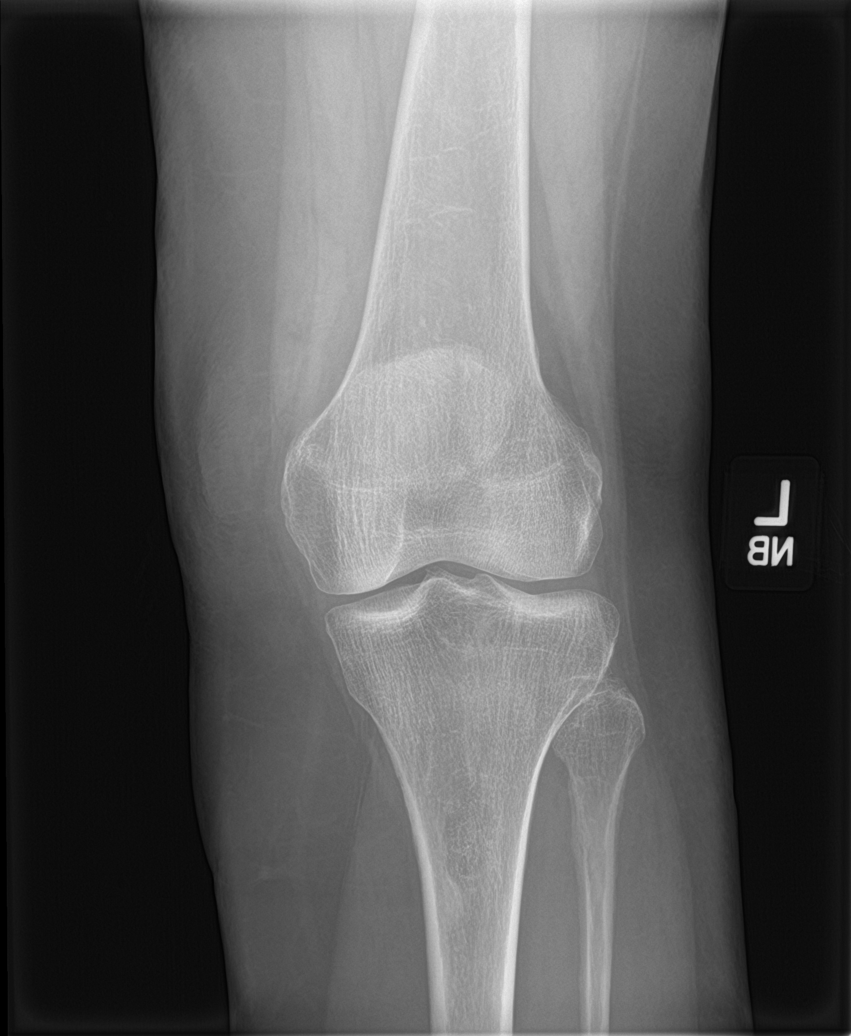

[knee lat]
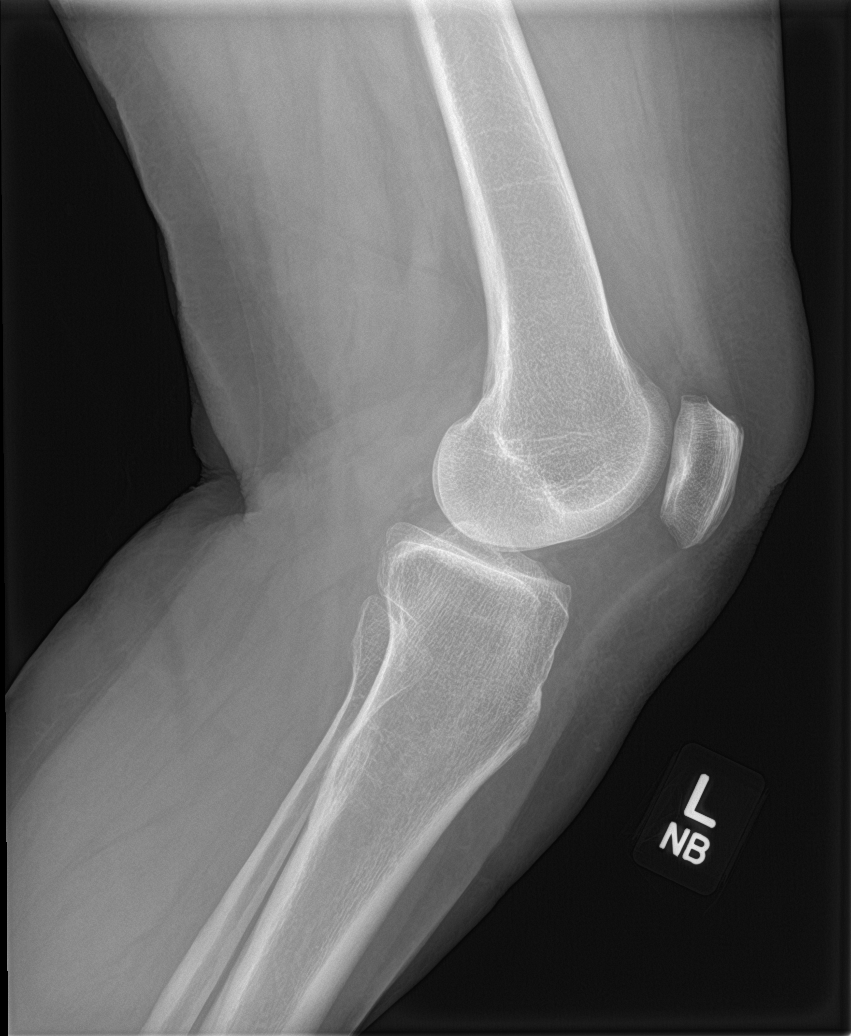

[knee sunrise]
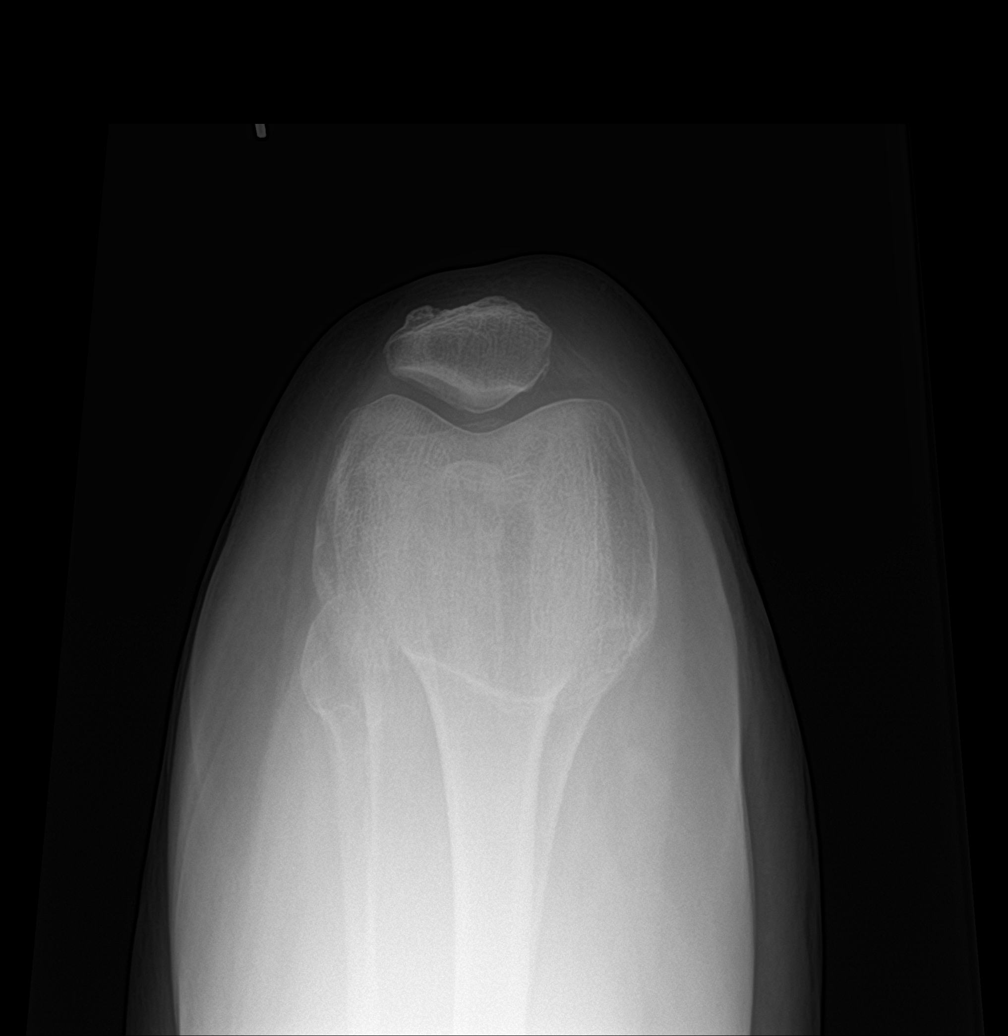

[knee obl (1 of 2)]
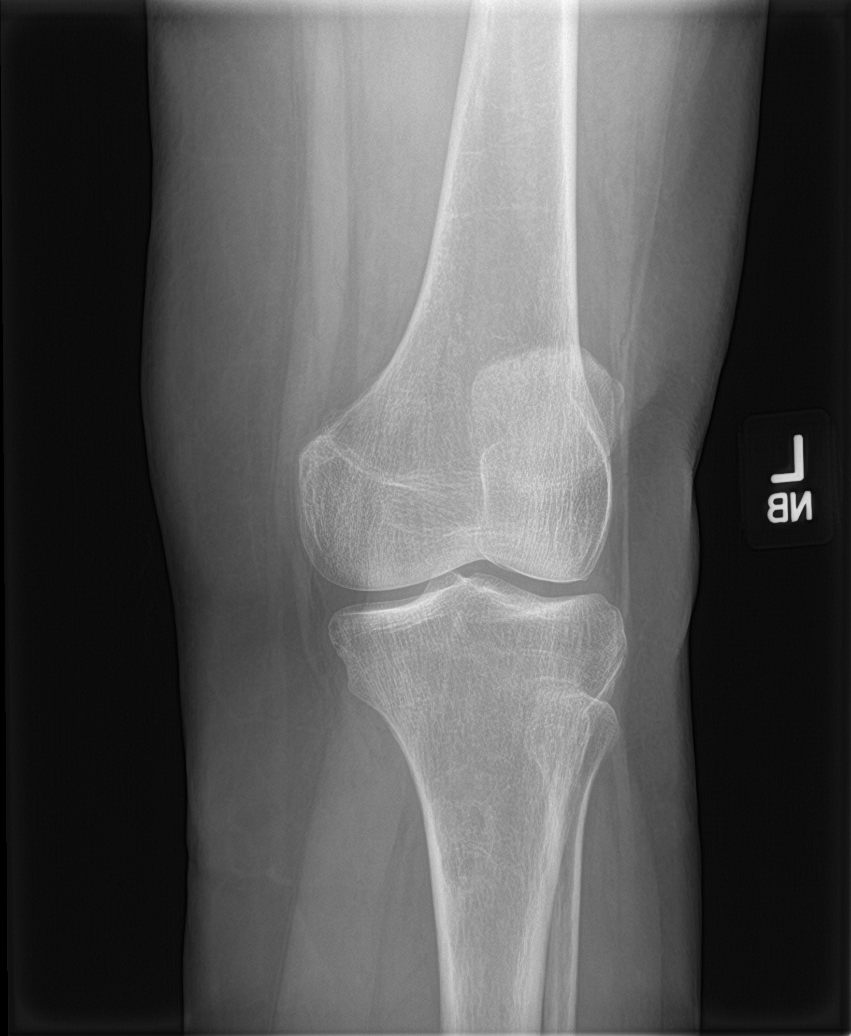

[knee obl (2 of 2)]
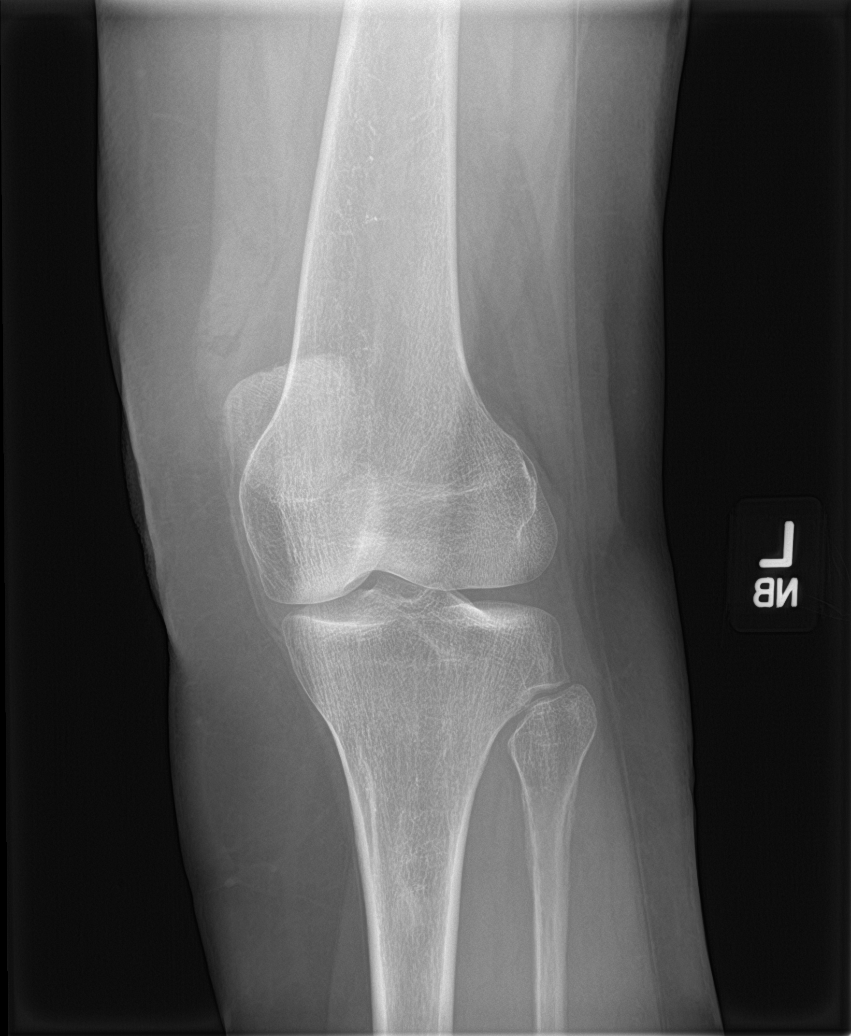

[5 of 5 positions shown; findings below may reference images not displayed]

FINDINGS: No evidence of fracture, dislocation, or joint effusion. No evidence
of arthropathy or other focal bone abnormality. Soft tissues are
unremarkable.
IMPRESSION: Negative.

## 2021-12-26 NOTE — Telephone Encounter (Signed)
Needs OV, may be moe complicated than arthritis. Does T have anything sooner?

## 2021-12-27 NOTE — Telephone Encounter (Signed)
Patient has been scheduled for Dr T on 01/02/22. AMUCK

## 2022-01-02 ENCOUNTER — Ambulatory Visit (INDEPENDENT_AMBULATORY_CARE_PROVIDER_SITE_OTHER): Payer: Medicare Other | Admitting: Sports Medicine

## 2022-01-02 ENCOUNTER — Ambulatory Visit (INDEPENDENT_AMBULATORY_CARE_PROVIDER_SITE_OTHER): Payer: Medicare Other

## 2022-01-02 DIAGNOSIS — M79605 Pain in left leg: Secondary | ICD-10-CM

## 2022-01-02 DIAGNOSIS — M25562 Pain in left knee: Secondary | ICD-10-CM | POA: Diagnosis not present

## 2022-01-02 DIAGNOSIS — M25561 Pain in right knee: Secondary | ICD-10-CM | POA: Diagnosis not present

## 2022-01-02 DIAGNOSIS — I708 Atherosclerosis of other arteries: Secondary | ICD-10-CM | POA: Diagnosis not present

## 2022-01-02 DIAGNOSIS — M79604 Pain in right leg: Secondary | ICD-10-CM

## 2022-01-02 NOTE — Assessment & Plan Note (Signed)
The is a very pleasant 76 year old female, she is a Tourist information centre manager. For the past month or so she has had increasing pain both knees, as well as worsening pronation at her ankles when trying to balance on 1 foot. She started rubbing some frankincense and coconut oil on both knees which seems to have helped her pain, but she still complains of weakness. I have recommended aggressive physical therapy for her knees and her ankles, I would like her to get some custom molded orthotics from Dr. Raeford Razor, and we will get some baseline x-rays of both of her knees. Return to see me in about 6 weeks. She does desire more of a nonpharmacologic approach if possible.

## 2022-01-02 NOTE — Progress Notes (Signed)
    Procedures performed today:    None.  Independent interpretation of notes and tests performed by another provider:   None.  Brief History, Exam, Impression, and Recommendations:    Bilateral leg pain The is a very pleasant 76 year old female, she is a Tourist information centre manager. For the past month or so she has had increasing pain both knees, as well as worsening pronation at her ankles when trying to balance on 1 foot. She started rubbing some frankincense and coconut oil on both knees which seems to have helped her pain, but she still complains of weakness. I have recommended aggressive physical therapy for her knees and her ankles, I would like her to get some custom molded orthotics from Dr. Raeford Razor, and we will get some baseline x-rays of both of her knees. Return to see me in about 6 weeks. She does desire more of a nonpharmacologic approach if possible.    ____________________________________________ Gwen Her. Dianah Field, M.D., ABFM., CAQSM., AME. Primary Care and Sports Medicine [facility]  Adjunct Professor of [employer] of Medicine  Risk manager

## 2022-01-03 ENCOUNTER — Encounter: Payer: Self-pay | Admitting: Sports Medicine

## 2022-01-03 DIAGNOSIS — M79604 Pain in right leg: Secondary | ICD-10-CM

## 2022-01-08 ENCOUNTER — Encounter: Payer: Self-pay | Admitting: Physical Therapy

## 2022-01-08 ENCOUNTER — Other Ambulatory Visit: Payer: Self-pay

## 2022-01-08 ENCOUNTER — Telehealth: Payer: Medicare Other | Admitting: Family Medicine

## 2022-01-08 ENCOUNTER — Ambulatory Visit: Payer: Medicare Other | Attending: Sports Medicine | Admitting: Physical Therapy

## 2022-01-08 DIAGNOSIS — M6281 Muscle weakness (generalized): Secondary | ICD-10-CM

## 2022-01-08 DIAGNOSIS — M79605 Pain in left leg: Secondary | ICD-10-CM | POA: Diagnosis not present

## 2022-01-08 DIAGNOSIS — M79604 Pain in right leg: Secondary | ICD-10-CM | POA: Diagnosis not present

## 2022-01-08 NOTE — Therapy (Signed)
[facility] [place], [place], [address] Phone: [phone]   Fax:  [phone]  Patient Details  Name: Traci [name] MRN: [id number] Date of Birth: 08/31/1945 Referring Provider:  Silverio Decamp,*  Encounter Date: 01/08/2022  OUTPATIENT PHYSICAL THERAPY LOWER EXTREMITY EVALUATION   Patient Name: Traci [name] MRN: [id number] DOB:1945/06/21, 76 y.o., female Today's Date: 01/08/2022   PT End of Session - 01/08/22 1056     Visit Number 1    Number of Visits 12    Date for PT Re-Evaluation 02/19/22    Authorization Type Medicare    Authorization - Visit Number 1    Progress Note Due on Visit 10    PT Start Time 7989    PT Stop Time 1050    PT Time Calculation (min) 35 min    Activity Tolerance Patient tolerated treatment well    Behavior During Therapy [facility] for tasks assessed/performed             Past Medical History:  Diagnosis Date   Allergy 1995   Contrast dye   Anxiety    Atrial fibrillation ([place])    CAD (coronary artery disease)    Claudication ([place]) 01/06/2008   Lower extremity dopplers - no evidence of arterial insufficiency, normal exam   Coronary artery disease    GERD (gastroesophageal reflux disease)    Heart murmur    Hyperlipidemia    Hypertension 11/30/2010   echo- EF 55%; normal w/ mildly sclerotic aortic valve   Hypertension 11/05/2011   renal dopplers - celiac artery and SMA >50% diameter reduction, R renal artery - mildly elevated velocities 1-59% diameter reduction, L renal artery normal   Nonspecific ST-T wave electrocardiographic changes 03/26/2011   R/Lmv - EF 74%, normal perfusion all regions, ST depression w/ Lexiscan infusion w/o assoc angina   Osteopenia    PAD (peripheral artery disease) (HCC)    Peripheral vascular disease (HCC)    PVD (peripheral vascular disease) ([place]) 11/05/2011   doppler - R/L brachial pressures essentially equal  w/o inflow disease; L sublclavian/CCA bypass graft demonstrates patent flow, no evidence of significant stenosis   Sigmoid diverticulitis    Past Surgical History:  Procedure Laterality Date   ABDOMINAL AORTIC ANEURYSM REPAIR N/A 01/27/2014   Procedure: AORTO-SUPERIOR MESENTERIC ARTERY BYPASS GRAFT;  Surgeon: Angelia Mould, MD;  Location: [facility] OR;  Service: Vascular;  Laterality: N/A;   ABDOMINAL HYSTERECTOMY     APPENDECTOMY     CARDIAC CATHETERIZATION  06/03/2008   60% LAD, involving D2, borderline significant by IVUS, medical therapy. CFX, RCA OK.   CATARACT EXTRACTION     CHOLECYSTECTOMY     EYE SURGERY     retinal surgery   SMALL INTESTINE SURGERY     SPINE SURGERY  07/2009   SUBCLAVIAN ARTERY STENT  1995   X's  several   SUBCLAVIAN ARTERY STENT Left 09/20/2008   LSA ISR 7x58m Cordis Genesis on Opta premount, reduction from 80% to 0%   subclavian artery stents  01/23/2010   Left carotid to subclavian artery Bypass   VISCERAL ANGIOGRAM  01/17/2014   Procedure: VISCERAL ANGIOGRAM;  Surgeon: CAngelia Mould MD;  [facility];  Service: Cardiovascular;;   Patient Active Problem List   Diagnosis Date Noted   Bilateral leg pain 01/02/2022   Sprain of anterior talofibular ligament of left ankle 09/28/2020   Edema 09/03/2019   Epigastric pain 08/11/2019   Insomnia 08/10/2019   Hypocalcemia  06/03/2019   Atherosclerosis of aorta ([place]) 01/04/2018   Osteoporosis 12/25/2017   CKD (chronic kidney disease) stage 3, GFR 30-59 ml/min (HCC) 08/04/2017   Primary osteoarthritis of first carpometacarpal joint of left hand 07/10/2017   Strain of right Achilles tendon 04/14/2017   Atypical nevus 08/22/2016   Acute bilateral low back pain with left-sided sciatica 06/06/2016   Menopause 12/08/2015   History of colonoscopy 07/17/2015   Irregular heartbeat 07/26/2014   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[place]) 01/16/2014    Irritable bowel syndrome 09/21/2013   Hyperlipidemia 04/05/2013   Hypertension 12/06/2012   Peripheral arterial disease ([place]) 12/06/2012   CAD, cath 2009 12/06/2012   Hyponatremia, improved holding diuretic 12/06/2012   Lymphocele of left arm 07/14/2012   Atherosclerosis of other specified arteries 04/02/2012   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 She has difficulty standing on her Rt leg in single leg stance. She also states her arches of her feet have been "flattening" and she is getting orthotics from Dr. Darene Lamer. 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 Pt has noticed decreased balance during dance classes. Pain increases with bending knees and twisting. Pain decreases with her homemade muscle rub.  PERTINENT HISTORY: History of sciatica, osteopenia  PAIN:  Are you having pain? Yes: pain in bilat knees 2/10  PRECAUTIONS: None  WEIGHT BEARING RESTRICTIONS No  FALLS:  Has patient fallen in last 6 months? No  LIVING ENVIRONMENT:  Stairs: No Has following equipment at home: None  OCCUPATION: dance teacher  PLOF: Independent  PATIENT GOALS be able to dance without pain   OBJECTIVE:   DIAGNOSTIC FINDINGS: x ray shows bilat OA in knees  PATIENT SURVEYS:  FOTO 58    EDEMA:  Edema present bilat knees     LOWER EXTREMITY ROM:  Active ROM Right eval Left eval  Hip flexion    Hip extension    Hip abduction    Hip adduction    Hip internal rotation    Hip external  rotation    Knee flexion 128 137  Knee extension 0 0  Ankle dorsiflexion    Ankle plantarflexion 3+/5 4/5  Ankle inversion    Ankle eversion     (Blank rows = not tested)  LOWER EXTREMITY MMT:  MMT Right eval Left eval  Hip flexion 4-/5 4/5  Hip extension    Hip abduction 4-/5 4/5  Hip adduction    Hip internal rotation    Hip external rotation    Knee flexion 4-/5 4/5  Knee extension 4-/5 4/5  Ankle dorsiflexion    Ankle plantarflexion    Ankle inversion    Ankle eversion     (Blank rows = not tested)    FUNCTIONAL TESTS:  Single leg stance: Rt 3 sec, Lt 12 sec Step down: increased valgus bilat with eccentric contraction Single leg heel raise: unable Rt, x 2 Lt      TODAY'S TREATMENT: 7/25 Therex: SLR x  10 bilat SLR with ER x 10 bilat Sidelying hip abd x 10 bilat Clam x 10 bilat Heel raises standing x 10 SLS x 10 sec bilat with intermittent UE support   PATIENT EDUCATION:  Education details: HEP, PT POC and goals Person educated: Patient Education method: Explanation, Demonstration, and Handouts Education comprehension: verbalized understanding and returned demonstration   HOME EXERCISE PROGRAM: [email or web address]  ASSESSMENT:  CLINICAL IMPRESSION: Patient is a 76 y.o. female who was seen today for physical therapy evaluation and treatment for bilat knee OA and ankle strengthening. Pt presents with decreased strength, balance, mobility, decreased activity tolerance and increased pain. Pt will benefit from skilled PT to address deficits and improve functional mobility.    OBJECTIVE IMPAIRMENTS decreased activity tolerance, decreased balance, decreased endurance, decreased ROM, decreased strength, increased edema, and pain.   ACTIVITY LIMITATIONS standing, stairs, and locomotion level  PARTICIPATION LIMITATIONS: meal prep, cleaning, and community activity  PERSONAL FACTORS Age and Fitness are also affecting patient's functional outcome.   REHAB  POTENTIAL: Good  CLINICAL DECISION MAKING: Evolving/moderate complexity  EVALUATION COMPLEXITY: Moderate   GOALS: Goals reviewed with patient? Yes    LONG TERM GOALS: Target date: 02/19/2022   Pt will be independent with HEP Baseline:  Goal status: INITIAL  2.  Pt will improve FOTO to >= 72 to demo improved functional mobility Baseline:  Goal status: INITIAL  3.  Pt will improve bilat LE strength to 4+/5 to improve standing and gait tolerance Baseline:  Goal status: INITIAL  4.  Pt will tolerate return to dancing with pain <= 1/10 Baseline:  Goal status: INITIAL     PLAN: PT FREQUENCY: 2x/week  PT DURATION: 6 weeks  PLANNED INTERVENTIONS: Therapeutic exercises, Therapeutic activity, Neuromuscular re-education, Balance training, Gait training, Patient/Family education, Self Care, Joint mobilization, Stair training, Dry Needling, Electrical stimulation, Cryotherapy, Moist heat, and Taping  PLAN FOR NEXT SESSION: assess and progress HEP, balance and LE strength for knees and ankles   Claude Waldman, PT 01/08/2022, 12:28 PM     [facility] [place], [place], [address] Phone: [phone]   Fax:  [phone]

## 2022-01-14 ENCOUNTER — Ambulatory Visit: Payer: Medicare Other | Admitting: Rehabilitative and Restorative Service Providers"

## 2022-01-14 DIAGNOSIS — M79605 Pain in left leg: Secondary | ICD-10-CM | POA: Diagnosis not present

## 2022-01-14 DIAGNOSIS — M79604 Pain in right leg: Secondary | ICD-10-CM

## 2022-01-14 DIAGNOSIS — M6281 Muscle weakness (generalized): Secondary | ICD-10-CM

## 2022-01-14 NOTE — Therapy (Signed)
[place] [address] [place], [place], [address] Phone: [phone]   Fax:  [phone]  Patient Details  Name: Traci [name] MRN: [id number] Date of Birth: June 05, 1946 Referring Provider:  Silverio Decamp,*  Encounter Date: 01/14/2022  OUTPATIENT PHYSICAL THERAPY LOWER EXTREMITY EVALUATION   Patient Name: Traci [name] MRN: [id number] DOB:1945-10-28, 76 y.o., female Today's Date: 01/14/2022   PT End of Session - 01/14/22 1014     Visit Number 2    Number of Visits 12    Date for PT Re-Evaluation 02/19/22    Authorization Type Medicare    Authorization - Visit Number 2    Progress Note Due on Visit 10    PT Start Time 1014    PT Stop Time 1100    PT Time Calculation (min) 46 min             Past Medical History:  Diagnosis Date   Allergy 1995   Contrast dye   Anxiety    Atrial fibrillation ([place])    CAD (coronary artery disease)    Claudication ([place]) 01/06/2008   Lower extremity dopplers - no evidence of arterial insufficiency, normal exam   Coronary artery disease    GERD (gastroesophageal reflux disease)    Heart murmur    Hyperlipidemia    Hypertension 11/30/2010   echo- EF 55%; normal w/ mildly sclerotic aortic valve   Hypertension 11/05/2011   renal dopplers - celiac artery and SMA >50% diameter reduction, R renal artery - mildly elevated velocities 1-59% diameter reduction, L renal artery normal   Nonspecific ST-T wave electrocardiographic changes 03/26/2011   R/Lmv - EF 74%, normal perfusion all regions, ST depression w/ Lexiscan infusion w/o assoc angina   Osteopenia    PAD (peripheral artery disease) (HCC)    Peripheral vascular disease (HCC)    PVD (peripheral vascular disease) ([place]) 11/05/2011   doppler - R/L brachial pressures essentially equal w/o inflow disease; L sublclavian/CCA bypass graft demonstrates patent flow, no evidence of significant stenosis    Sigmoid diverticulitis    Past Surgical History:  Procedure Laterality Date   ABDOMINAL AORTIC ANEURYSM REPAIR N/A 01/27/2014   Procedure: AORTO-SUPERIOR MESENTERIC ARTERY BYPASS GRAFT;  Surgeon: Angelia Mould, MD;  Location: [facility] OR;  Service: Vascular;  Laterality: N/A;   ABDOMINAL HYSTERECTOMY     APPENDECTOMY     CARDIAC CATHETERIZATION  06/03/2008   60% LAD, involving D2, borderline significant by IVUS, medical therapy. CFX, RCA OK.   CATARACT EXTRACTION     CHOLECYSTECTOMY     EYE SURGERY     retinal surgery   SMALL INTESTINE SURGERY     SPINE SURGERY  07/2009   SUBCLAVIAN ARTERY STENT  1995   X's  several   SUBCLAVIAN ARTERY STENT Left 09/20/2008   LSA ISR 7x34m Cordis Genesis on Opta premount, reduction from 80% to 0%   subclavian artery stents  01/23/2010   Left carotid to subclavian artery Bypass   VISCERAL ANGIOGRAM  01/17/2014   Procedure: VISCERAL ANGIOGRAM;  Surgeon: CAngelia Mould MD;  [facility];  Service: Cardiovascular;;   Patient Active Problem List   Diagnosis Date Noted   Bilateral leg pain 01/02/2022   Sprain of anterior talofibular ligament of left ankle 09/28/2020   Edema 09/03/2019   Epigastric pain 08/11/2019   Insomnia 08/10/2019   Hypocalcemia 06/03/2019   Atherosclerosis of aorta ([place] 01/04/2018   Osteoporosis 12/25/2017   CKD (chronic kidney disease) stage  3, GFR 30-59 ml/min (HCC) 08/04/2017   Primary osteoarthritis of first carpometacarpal joint of left hand 07/10/2017   Strain of right Achilles tendon 04/14/2017   Atypical nevus 08/22/2016   Acute bilateral low back pain with left-sided sciatica 06/06/2016   Menopause 12/08/2015   History of colonoscopy 07/17/2015   Irregular heartbeat 07/26/2014   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[place]) 01/16/2014   Irritable bowel syndrome 09/21/2013   Hyperlipidemia 04/05/2013   Hypertension 12/06/2012   Peripheral arterial  disease ([place]) 12/06/2012   CAD, cath 2009 12/06/2012   Hyponatremia, improved holding diuretic 12/06/2012   Lymphocele of left arm 07/14/2012   Atherosclerosis of other specified arteries 04/02/2012   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 Will now do the exercises on the bed.    01/08/22: Pt states that for the past 6 weeks she feels her legs have been "giving out", Rt worse than Lt. She has difficulty standing on her Rt leg in single leg stance. She also states her arches of her feet have been "flattening" and she is getting orthotics from Dr. Darene Lamer. 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 Pt has noticed decreased balance during dance classes. Pain increases with bending knees and twisting. Pain decreases with her homemade muscle rub.  PERTINENT HISTORY: History of sciatica, osteopenia  PAIN:  Are you having pain? Yes: pain in bilat knees 2/10  PRECAUTIONS: None  WEIGHT BEARING RESTRICTIONS No  FALLS:  Has patient fallen in last 6 months? No  LIVING ENVIRONMENT:  Stairs: No Has following equipment at home: None  OCCUPATION: dance teacher  PLOF: Independent  PATIENT GOALS be able to dance without pain   OBJECTIVE:   DIAGNOSTIC FINDINGS: x ray shows bilat OA in knees  PATIENT SURVEYS:  FOTO 58    EDEMA:  Edema present bilat knees     LOWER EXTREMITY ROM:  Active ROM Right eval Left eval  Hip flexion    Hip extension     Hip abduction    Hip adduction    Hip internal rotation    Hip external rotation    Knee flexion 128 137  Knee extension 0 0  Ankle dorsiflexion    Ankle plantarflexion 3+/5 4/5  Ankle inversion    Ankle eversion     (Blank rows = not tested)  LOWER EXTREMITY MMT:  MMT Right eval Left eval  Hip flexion 4-/5 4/5  Hip extension    Hip abduction 4-/5 4/5  Hip adduction    Hip internal rotation    Hip external rotation    Knee flexion 4-/5 4/5  Knee extension 4-/5 4/5  Ankle dorsiflexion    Ankle plantarflexion    Ankle inversion    Ankle eversion     (Blank rows = not tested)    FUNCTIONAL TESTS:  Single leg stance: Rt 3 sec, Lt 12 sec Step down: increased valgus bilat with eccentric contraction  Single leg heel raise: unable Rt, x 2 Lt      TODAY'S TREATMENT:  01/14/22 Therex: SLR x 10 x 2 sets 3 sec hold bilat alternating sets SLR with ER x 10 x 2 sets 3 sec hold alternating bilat Sidelying hip abd x 10 bilat Clam x 10 bilat Bridge x 10 5 sec hold  Sit to stand x 10 without UE support               SLS x 10 sec x 5 reps bilat with intermittent UE support              Step up fwd x 10 reps x 2 sets alternating 6 inch step      PATIENT EDUCATION:  Education details: HEP, PT POC and goals Person educated: Patient Education method: Explanation, Demonstration, and Handouts Education comprehension: verbalized understanding and returned demonstration   HOME EXERCISE PROGRAM: [email or web address] Access Code: [email or web address] URL: [email or web address] Date: 01/14/2022 Prepared by: Gillermo Murdoch  Exercises - Clamshell  - 1 x daily - 7 x weekly - 2 sets - 10 reps - Sidelying Hip Abduction  - 1 x daily - 7 x weekly - 2 sets - 10 reps - Active Straight Leg Raise with Quad Set  - 1 x daily - 7 x weekly - 2 sets - 10 reps - Straight Leg Raise with External Rotation  - 1 x daily - 7 x weekly - 2 sets - 10 reps - Single Leg Stance with Support  - 1 x daily - 7 x  weekly - 1 sets - 10 reps - 10-20 sec hold - Beginner Bridge  - 1 x daily - 7 x weekly - 1 sets - 10 reps - 5 sec  hold - Sit to Stand  - 1 x daily - 7 x weekly - 1 sets - 10 reps - 3-5 sec  hold - Step Up  - 1 x daily - 7 x weekly - 1 sets - 10 reps - 2 sec  hold   ASSESSMENT:  CLINICAL IMPRESSION:  Patient returns reporting the she has bruises on ankles and toes from the heel raises. Held heel raises. Discussed doing her exercises on the bed instead of the floor to avoid soreness in the ribs. Continued with strengthening, reviewing exercises and adding exercises.    01/08/22:Patient is a 76 y.o. female who was seen today for physical therapy evaluation and treatment for bilat knee OA and ankle strengthening. Pt presents with decreased strength, balance, mobility, decreased activity tolerance and increased pain. Pt will benefit from skilled PT to address deficits and improve functional mobility.    OBJECTIVE IMPAIRMENTS decreased activity tolerance, decreased balance, decreased endurance, decreased ROM, decreased strength, increased edema, and pain.   ACTIVITY LIMITATIONS standing, stairs, and locomotion level  PARTICIPATION LIMITATIONS: meal prep, cleaning, and community activity  PERSONAL FACTORS Age and Fitness are also affecting patient's functional outcome.   REHAB POTENTIAL: Good  CLINICAL DECISION MAKING: Evolving/moderate complexity  EVALUATION COMPLEXITY: Moderate   GOALS: Goals reviewed with patient? Yes    LONG TERM GOALS: Target date: 02/19/2022   Pt will be independent with HEP Baseline:  Goal status: INITIAL  2.  Pt will improve FOTO to >= 72 to demo improved functional mobility Baseline:  Goal status: INITIAL  3.  Pt will improve bilat LE strength to 4+/5 to improve standing and gait tolerance Baseline:  Goal status: INITIAL  4.  Pt will tolerate return to dancing with pain <= 1/10  Baseline:  Goal status: INITIAL     PLAN: PT FREQUENCY:  2x/week  PT DURATION: 6 weeks  PLANNED INTERVENTIONS: Therapeutic exercises, Therapeutic activity, Neuromuscular re-education, Balance training, Gait training, Patient/Family education, Self Care, Joint mobilization, Stair training, Dry Needling, Electrical stimulation, Cryotherapy, Moist heat, and Taping  PLAN FOR NEXT SESSION: assess and progress HEP, balance and LE strength for knees and ankles   Vinicio Lynk Nilda Simmer, PT, MPH  01/08/2022, 12:28 PM     [facility] [address] [place], [place], [address] Phone: [phone]   Fax:  [place], [place], [address] Phone: [phone]   Fax:  [phone]

## 2022-01-16 ENCOUNTER — Ambulatory Visit: Payer: Medicare Other | Attending: Sports Medicine | Admitting: Rehabilitative and Restorative Service Providers"

## 2022-01-16 ENCOUNTER — Encounter: Payer: Self-pay | Admitting: Rehabilitative and Restorative Service Providers"

## 2022-01-16 ENCOUNTER — Ambulatory Visit: Payer: Medicare Other

## 2022-01-16 DIAGNOSIS — M79605 Pain in left leg: Secondary | ICD-10-CM

## 2022-01-16 DIAGNOSIS — M79604 Pain in right leg: Secondary | ICD-10-CM | POA: Diagnosis present

## 2022-01-16 DIAGNOSIS — M6281 Muscle weakness (generalized): Secondary | ICD-10-CM | POA: Diagnosis present

## 2022-01-16 NOTE — Therapy (Signed)
[name], [place], [address] Phone: [phone]   Fax:  [phone]  Patient Details  Name: Traci [name] MRN: [id number] Date of Birth: Oct 31, 1945 Referring Provider:  Silverio Decamp,*  Encounter Date: 01/16/2022  OUTPATIENT PHYSICAL THERAPY LOWER EXTREMITY EVALUATION   Patient Name: Traci [name] MRN: [id number] DOB:[birth year], 76 y.o., female Today's Date: 01/16/2022   PT End of Session - 01/16/22 1029     Visit Number 3    Number of Visits 12    Date for PT Re-Evaluation 02/19/22    Authorization Type Medicare    Authorization - Visit Number 3    Progress Note Due on Visit 10    PT Start Time 1014    PT Stop Time 1100    PT Time Calculation (min) 46 min    Activity Tolerance Patient tolerated treatment well             Past Medical History:  Diagnosis Date   Allergy 1995   Contrast dye   Anxiety    Atrial fibrillation ([place])    CAD (coronary artery disease)    Claudication ([place]) 01/06/2008   Lower extremity dopplers - no evidence of arterial insufficiency, normal exam   Coronary artery disease    GERD (gastroesophageal reflux disease)    Heart murmur    Hyperlipidemia    Hypertension 11/30/2010   echo- EF 55%; normal w/ mildly sclerotic aortic valve   Hypertension 11/05/2011   renal dopplers - celiac artery and SMA >50% diameter reduction, R renal artery - mildly elevated velocities 1-59% diameter reduction, L renal artery normal   Nonspecific ST-T wave electrocardiographic changes 03/26/2011   R/Lmv - EF 74%, normal perfusion all regions, ST depression w/ Lexiscan infusion w/o assoc angina   Osteopenia    PAD (peripheral artery disease) (HCC)    Peripheral vascular disease (HCC)    PVD (peripheral vascular disease) ([place]) 11/05/2011   doppler - R/L brachial pressures essentially equal w/o inflow disease; L sublclavian/CCA bypass graft  demonstrates patent flow, no evidence of significant stenosis   Sigmoid diverticulitis    Past Surgical History:  Procedure Laterality Date   ABDOMINAL AORTIC ANEURYSM REPAIR N/A 01/27/2014   Procedure: AORTO-SUPERIOR MESENTERIC ARTERY BYPASS GRAFT;  Surgeon: Angelia Mould, MD;  Location: [facility];  Service: Vascular;  Laterality: N/A;   ABDOMINAL HYSTERECTOMY     APPENDECTOMY     CARDIAC CATHETERIZATION  06/03/2008   60% LAD, involving D2, borderline significant by IVUS, medical therapy. CFX, RCA OK.   CATARACT EXTRACTION     CHOLECYSTECTOMY     EYE SURGERY     retinal surgery   SMALL INTESTINE SURGERY     SPINE SURGERY  07/2009   SUBCLAVIAN ARTERY STENT  1995   X's  several   SUBCLAVIAN ARTERY STENT Left 09/20/2008   LSA ISR 7x86m Cordis Genesis on Opta premount, reduction from 80% to 0%   subclavian artery stents  01/23/2010   Left carotid to subclavian artery Bypass   VISCERAL ANGIOGRAM  01/17/2014   Procedure: VISCERAL ANGIOGRAM;  Surgeon: CAngelia Mould MD;  [facility];  Service: Cardiovascular;;   Patient Active Problem List   Diagnosis Date Noted   Bilateral leg pain 01/02/2022   Sprain of anterior talofibular ligament of left ankle 09/28/2020   Edema 09/03/2019   Epigastric pain 08/11/2019   Insomnia 08/10/2019   Hypocalcemia 06/03/2019   Atherosclerosis of aorta ([place] 01/04/2018  Osteoporosis 12/25/2017   CKD (chronic kidney disease) stage 3, GFR 30-59 ml/min (HCC) 08/04/2017   Primary osteoarthritis of first carpometacarpal joint of left hand 07/10/2017   Strain of right Achilles tendon 04/14/2017   Atypical nevus 08/22/2016   Acute bilateral low back pain with left-sided sciatica 06/06/2016   Menopause 12/08/2015   History of colonoscopy 07/17/2015   Irregular heartbeat 07/26/2014   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   Irritable bowel syndrome 09/21/2013    Hyperlipidemia 04/05/2013   Hypertension 12/06/2012   Peripheral arterial disease ([place]) 12/06/2012   CAD, cath 2009 12/06/2012   Hyponatremia, improved holding diuretic 12/06/2012   Lymphocele of left arm 07/14/2012   Atherosclerosis of other specified arteries 04/02/2012   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 She has been doing exercises on the bed instead of on the floor. She is planning to go dancing this weekend.      01/08/22: Pt states that for the past 6 weeks she feels her legs have been "giving out", Rt worse than Lt. She has difficulty standing on her Rt leg in single leg stance. She also states her arches of her feet have been "flattening" and she is getting orthotics from Dr. Darene [name]. 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 Pt has noticed decreased balance during dance classes. Pain increases with bending knees and twisting. Pain decreases with her homemade muscle rub.  PERTINENT HISTORY: History of sciatica, osteopenia  PAIN:  Are you having pain? Yes:  pain in bilat knees 5/10 In the back of the leg, mostly Rt   PRECAUTIONS: None  WEIGHT BEARING RESTRICTIONS No  FALLS:  Has patient fallen in last 6 months? No  LIVING ENVIRONMENT:  Stairs: No Has following equipment at home: None  OCCUPATION: dance teacher  PLOF: Independent  PATIENT GOALS be able to dance without pain   OBJECTIVE:   DIAGNOSTIC FINDINGS: x ray shows bilat OA in knees  PATIENT SURVEYS:  FOTO 58    EDEMA:  Edema  present bilat knees     LOWER EXTREMITY ROM:  Active ROM Right eval Left eval  Hip flexion    Hip extension    Hip abduction    Hip adduction    Hip internal rotation    Hip external rotation    Knee flexion 128 137  Knee extension 0 0  Ankle dorsiflexion    Ankle plantarflexion 3+/5 4/5  Ankle inversion    Ankle eversion     (Blank rows = not tested)  LOWER EXTREMITY MMT:  MMT Right eval Left eval  Hip flexion 4-/5 4/5  Hip extension    Hip abduction 4-/5 4/5  Hip adduction    Hip internal rotation    Hip external rotation    Knee flexion 4-/5 4/5  Knee extension 4-/5 4/5  Ankle dorsiflexion    Ankle plantarflexion    Ankle inversion    Ankle eversion     (Blank rows = not tested)  FUNCTIONAL TESTS:  Single leg stance: Rt 3 sec, Lt 12 sec Step down: increased valgus bilat with eccentric contraction Single leg heel raise: unable Rt, x 2 Lt      TODAY'S TREATMENT:  01/16/22 Therex: Nustep L5 x 8 min  Hamstring stretch sitting 30 sec hold x 3 reps  Abdominal bracing 10 sec x 10 reps  Clam x 15 Bridge x 10 reps 5 sec hold SLR x 5 x 1 sets 3 sec hold bilat  SLR with ER x 5 x 1 sets 3 sec hold bilat Sidelying hip abd x 15 bilat  Hold - Sit to stand x 10 without UE support               SLS x 15 sec x 5 reps bilat with intermittent UE support              Step up fwd x 10 reps x 2 sets alternating 6 inch step      PATIENT EDUCATION:  Education details: HEP, PT POC and goals Person educated: Patient Education method: Explanation, Demonstration, and Handouts Education comprehension: verbalized understanding and returned demonstration   HOME EXERCISE PROGRAM: Access Code: [email or web address] URL: [email or web address] Date: 01/16/2022 Prepared by: Gillermo Murdoch  Exercises - Clamshell  - 1 x daily - 7 x weekly - 2 sets - 10 reps - Sidelying Hip Abduction  - 1 x daily - 7 x weekly - 2 sets - 10 reps - Active Straight Leg Raise with Quad  Set  - 1 x daily - 7 x weekly - 2 sets - 10 reps - Straight Leg Raise with External Rotation  - 1 x daily - 7 x weekly - 2 sets - 10 reps - Single Leg Stance with Support  - 1 x daily - 7 x weekly - 1 sets - 10 reps - 10-20 sec hold - Beginner Bridge  - 1 x daily - 7 x weekly - 1 sets - 10 reps - 5 sec  hold - Sit to Stand  - 1 x daily - 7 x weekly - 1 sets - 10 reps - 3-5 sec  hold - Step Up  - 1 x daily - 7 x weekly - 1 sets - 10 reps - 2 sec  hold - Seated Hamstring Stretch  - 2 x daily - 7 x weekly - 1 sets - 3 reps - 30 sec  hold   ASSESSMENT:  CLINICAL IMPRESSION:  Patient reports the she has soreness in Rt > Lt leg from last visit. Modified exercise during today's visit due to soreness. Continued with strengthening, reviewing exercises and adding exercises.    01/08/22: Eval - Patient is a 76 y.o. female who was seen today for physical therapy evaluation and treatment for bilat knee OA and ankle strengthening. Pt presents with decreased strength, balance, mobility, decreased activity tolerance and increased pain. Pt will benefit from skilled PT to address deficits and improve functional mobility.    OBJECTIVE IMPAIRMENTS decreased activity tolerance, decreased balance, decreased endurance, decreased ROM, decreased strength, increased edema, and pain.   ACTIVITY LIMITATIONS standing, stairs, and locomotion level  PARTICIPATION LIMITATIONS: meal prep, cleaning, and community activity  PERSONAL FACTORS Age and Fitness are also affecting patient's functional outcome.   REHAB POTENTIAL: Good  CLINICAL DECISION MAKING: Evolving/moderate complexity  EVALUATION COMPLEXITY: Moderate   GOALS: Goals reviewed with patient? Yes    LONG TERM GOALS: Target date: 02/19/2022   Pt will be independent with HEP Baseline:  Goal  status: INITIAL  2.  Pt will improve FOTO to >= 72 to demo improved functional mobility Baseline:  Goal status: INITIAL  3.  Pt will improve bilat LE strength  to 4+/5 to improve standing and gait tolerance Baseline:  Goal status: INITIAL  4.  Pt will tolerate return to dancing with pain <= 1/10 Baseline:  Goal status: INITIAL     PLAN: PT FREQUENCY: 2x/week  PT DURATION: 6 weeks  PLANNED INTERVENTIONS: Therapeutic exercises, Therapeutic activity, Neuromuscular re-education, Balance training, Gait training, Patient/Family education, Self Care, Joint mobilization, Stair training, Dry Needling, Electrical stimulation, Cryotherapy, Moist heat, and Taping  PLAN FOR NEXT SESSION: assess and progress HEP, balance and LE strength for knees and ankles   Jaline Pincock Nilda Simmer, PT, MPH  01/08/2022, 12:28 PM     [facility] [place] [address] [place], [place], [address] Phone: [phone]   Fax:  [place], [place], [address] Phone: [phone]   Fax:  [place], [place], [address] Phone: [phone]   Fax:  [phone]  Physical Therapy Treatment  Patient Details  Name: PRISHA HILEY MRN: [id number] Date of Birth: 1946-03-22 No data recorded  Encounter Date: 01/16/2022   PT End of Session - 01/16/22 1029     Visit Number 3    Number of Visits 12    Date for PT Re-Evaluation 02/19/22    Authorization Type Medicare    Authorization - Visit Number 3    Progress Note Due on Visit 10    PT Start Time 1014    PT Stop Time 1100    PT Time Calculation (min) 46 min    Activity Tolerance Patient tolerated treatment well             Past Medical History:  Diagnosis Date   Allergy 1995   Contrast dye   Anxiety    Atrial fibrillation ([place])    CAD (coronary artery disease)    Claudication ([place]) 01/06/2008   Lower extremity dopplers - no evidence of arterial insufficiency, normal exam    Coronary artery disease    GERD (gastroesophageal reflux disease)    Heart murmur    Hyperlipidemia    Hypertension 11/30/2010   echo- EF 55%; normal w/ mildly sclerotic aortic valve   Hypertension 11/05/2011   renal dopplers - celiac artery and SMA >50% diameter reduction, R renal artery - mildly elevated velocities 1-59% diameter reduction, L renal artery normal   Nonspecific ST-T wave electrocardiographic changes 03/26/2011   R/Lmv - EF 74%, normal perfusion all regions, ST depression w/ Lexiscan infusion w/o assoc angina   Osteopenia    PAD (peripheral artery disease) (HCC)    Peripheral vascular disease (HCC)    PVD (peripheral vascular disease) ([place]) 11/05/2011   doppler - R/L brachial pressures essentially equal w/o inflow disease; L sublclavian/CCA bypass graft demonstrates patent flow, no evidence of significant stenosis   Sigmoid diverticulitis     Past Surgical History:  Procedure Laterality Date   ABDOMINAL AORTIC ANEURYSM REPAIR N/A 01/27/2014   Procedure: AORTO-SUPERIOR MESENTERIC ARTERY BYPASS GRAFT;  Surgeon: Angelia Mould, MD;  Location: [facility] OR;  Service: Vascular;  Laterality: N/A;   ABDOMINAL HYSTERECTOMY     APPENDECTOMY     CARDIAC CATHETERIZATION  06/03/2008   60% LAD, involving D2, borderline significant by IVUS, medical therapy. CFX, RCA OK.  CATARACT EXTRACTION     CHOLECYSTECTOMY     EYE SURGERY     retinal surgery   SMALL INTESTINE SURGERY     SPINE SURGERY  07/2009   SUBCLAVIAN ARTERY STENT  1995   X's  several   SUBCLAVIAN ARTERY STENT Left 09/20/2008   LSA ISR 7x74m Cordis Genesis on Opta premount, reduction from 80% to 0%   subclavian artery stents  01/23/2010   Left carotid to subclavian artery Bypass   VISCERAL ANGIOGRAM  01/17/2014   Procedure: VISCERAL ANGIOGRAM;  Surgeon: CAngelia Mould MD;  [facility];  Service: Cardiovascular;;    There were no vitals filed for this  visit.                                            Patient will benefit from skilled therapeutic intervention in order to improve the following deficits and impairments:     Visit Diagnosis: Pain in left leg  Pain in right leg  Muscle weakness (generalized)     Problem List Patient Active Problem List   Diagnosis Date Noted   Bilateral leg pain 01/02/2022   Sprain of anterior talofibular ligament of left ankle 09/28/2020   Edema 09/03/2019   Epigastric pain 08/11/2019   Insomnia 08/10/2019   Hypocalcemia 06/03/2019   Atherosclerosis of aorta (HCC) 01/04/2018   Osteoporosis 12/25/2017   CKD (chronic kidney disease) stage 3, GFR 30-59 ml/min (HCC) 08/04/2017   Primary osteoarthritis of first carpometacarpal joint of left hand 07/10/2017   Strain of right Achilles tendon 04/14/2017   Atypical nevus 08/22/2016   Acute bilateral low back pain with left-sided sciatica 06/06/2016   Menopause 12/08/2015   History of colonoscopy 07/17/2015   Irregular heartbeat 07/26/2014   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[place] 01/16/2014   Irritable bowel syndrome 09/21/2013   Hyperlipidemia 04/05/2013   Hypertension 12/06/2012   Peripheral arterial disease ([place] 12/06/2012   CAD, cath 2009 12/06/2012   Hyponatremia, improved holding diuretic 12/06/2012   Lymphocele of left arm 07/14/2012   Atherosclerosis of other specified arteries 04/02/2012   Subclavian arterial stenosis ([place] 04/02/2012   Stricture of artery ([place] 10/02/2011   Allergic dermatitis 06/09/2011    Karilynn Carranza PNilda Simmer PT, MPH  01/16/2022, 10:30 AM  [facility] [address]Phone: [phone]  Fax:  [phone] Name: DPAULENA SERVAISMRN: [id number] of Birth: [birth year]

## 2022-01-17 ENCOUNTER — Ambulatory Visit (INDEPENDENT_AMBULATORY_CARE_PROVIDER_SITE_OTHER): Payer: Medicare Other

## 2022-01-17 DIAGNOSIS — Z1231 Encounter for screening mammogram for malignant neoplasm of breast: Secondary | ICD-10-CM | POA: Diagnosis not present

## 2022-01-18 NOTE — Progress Notes (Signed)
Please call patient. Normal mammogram.  Repeat in 1 year.  

## 2022-01-21 ENCOUNTER — Other Ambulatory Visit: Payer: Self-pay | Admitting: Family Medicine

## 2022-01-21 ENCOUNTER — Ambulatory Visit: Payer: Medicare Other | Admitting: Rehabilitative and Restorative Service Providers"

## 2022-01-21 ENCOUNTER — Encounter: Payer: Self-pay | Admitting: Family Medicine

## 2022-01-21 ENCOUNTER — Encounter: Payer: Self-pay | Admitting: Rehabilitative and Restorative Service Providers"

## 2022-01-21 DIAGNOSIS — M6281 Muscle weakness (generalized): Secondary | ICD-10-CM | POA: Diagnosis not present

## 2022-01-21 DIAGNOSIS — M79605 Pain in left leg: Secondary | ICD-10-CM

## 2022-01-21 DIAGNOSIS — M79604 Pain in right leg: Secondary | ICD-10-CM

## 2022-01-21 DIAGNOSIS — G4700 Insomnia, unspecified: Secondary | ICD-10-CM

## 2022-01-21 MED ORDER — CLORAZEPATE DIPOTASSIUM 7.5 MG PO TABS: 3.7500 mg | ORAL_TABLET | Freq: Every day | ORAL | 0 refills | Status: DC | PRN

## 2022-01-21 NOTE — Therapy (Signed)
[facility] [address] [place], [place], [address] Phone: [phone]   Fax:  [phone]  Patient Details  Name: Traci [name] MRN: [id number] Date of Birth: [birth year] Referring Provider:  Silverio Decamp,*  Encounter Date: 01/21/2022  OUTPATIENT PHYSICAL THERAPY LOWER EXTREMITY EVALUATION   Patient Name: Traci [name] MRN: [id number] DOB:May 07, 1946, 76 y.o., female Today's Date: 01/21/2022   PT End of Session - 01/21/22 1008     Visit Number 4    Number of Visits 12    Date for PT Re-Evaluation 02/19/22    Authorization Type Medicare    Authorization - Visit Number 4    Progress Note Due on Visit 10    PT Start Time 1009    PT Stop Time 1055    PT Time Calculation (min) 46 min    Activity Tolerance Patient tolerated treatment well             Past Medical History:  Diagnosis Date   Allergy 1995   Contrast dye   Anxiety    Atrial fibrillation ([place])    CAD (coronary artery disease)    Claudication ([place]) 01/06/2008   Lower extremity dopplers - no evidence of arterial insufficiency, normal exam   Coronary artery disease    GERD (gastroesophageal reflux disease)    Heart murmur    Hyperlipidemia    Hypertension 11/30/2010   echo- EF 55%; normal w/ mildly sclerotic aortic valve   Hypertension 11/05/2011   renal dopplers - celiac artery and SMA >50% diameter reduction, R renal artery - mildly elevated velocities 1-59% diameter reduction, L renal artery normal   Nonspecific ST-T wave electrocardiographic changes 03/26/2011   R/Lmv - EF 74%, normal perfusion all regions, ST depression w/ Lexiscan infusion w/o assoc angina   Osteopenia    PAD (peripheral artery disease) (HCC)    Peripheral vascular disease (HCC)    PVD (peripheral vascular disease) ([place]) 11/05/2011   doppler - R/L brachial pressures essentially equal w/o inflow disease; L sublclavian/CCA bypass graft  demonstrates patent flow, no evidence of significant stenosis   Sigmoid diverticulitis    Past Surgical History:  Procedure Laterality Date   ABDOMINAL AORTIC ANEURYSM REPAIR N/A 01/27/2014   Procedure: AORTO-SUPERIOR MESENTERIC ARTERY BYPASS GRAFT;  Surgeon: Angelia Mould, MD;  [facility];  Service: Vascular;  Laterality: N/A;   ABDOMINAL HYSTERECTOMY     APPENDECTOMY     CARDIAC CATHETERIZATION  06/03/2008   60% LAD, involving D2, borderline significant by IVUS, medical therapy. CFX, RCA OK.   CATARACT EXTRACTION     CHOLECYSTECTOMY     EYE SURGERY     retinal surgery   SMALL INTESTINE SURGERY     SPINE SURGERY  07/2009   SUBCLAVIAN ARTERY STENT  1995   X's  several   SUBCLAVIAN ARTERY STENT Left 09/20/2008   LSA ISR 7x97m Cordis Genesis on Opta premount, reduction from 80% to 0%   subclavian artery stents  01/23/2010   Left carotid to subclavian artery Bypass   VISCERAL ANGIOGRAM  01/17/2014   Procedure: VISCERAL ANGIOGRAM;  Surgeon: CAngelia Mould MD;  [facility];  Service: Cardiovascular;;   Patient Active Problem List   Diagnosis Date Noted   Bilateral leg pain 01/02/2022   Sprain of anterior talofibular ligament of left ankle 09/28/2020   Edema 09/03/2019   Epigastric pain 08/11/2019   Insomnia 08/10/2019   Hypocalcemia 06/03/2019   Atherosclerosis of aorta ([place] 01/04/2018  Osteoporosis 12/25/2017   CKD (chronic kidney disease) stage 3, GFR 30-59 ml/min (HCC) 08/04/2017   Primary osteoarthritis of first carpometacarpal joint of left hand 07/10/2017   Strain of right Achilles tendon 04/14/2017   Atypical nevus 08/22/2016   Acute bilateral low back pain with left-sided sciatica 06/06/2016   Menopause 12/08/2015   History of colonoscopy 07/17/2015   Irregular heartbeat 07/26/2014   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[place]) 01/16/2014   Irritable bowel syndrome 09/21/2013    Hyperlipidemia 04/05/2013   Hypertension 12/06/2012   Peripheral arterial disease ([place]) 12/06/2012   CAD, cath 2009 12/06/2012   Hyponatremia, improved holding diuretic 12/06/2012   Lymphocele of left arm 07/14/2012   Atherosclerosis of other specified arteries 04/02/2012   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 She had no pain during the dancing. She continues to do her exercises on the bed instead of on the floor. Just overdid it yesterday.     Eval - 01/08/22: Pt states that for the past 6 weeks she feels her legs have been "giving out", Rt worse than Lt. She has difficulty standing on her Rt leg in single leg stance. She also states her arches of her feet have been "flattening" and she is getting orthotics from Dr. Darene Lamer. 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 Pt has noticed decreased balance during dance classes. Pain increases with bending knees and twisting. Pain decreases with her homemade muscle rub.  PERTINENT HISTORY: History of sciatica, osteopenia  PAIN:  Are you having pain? Yes:  pain in bilat knees 6/10 In the back of the leg, mostly Rt   PRECAUTIONS: None  WEIGHT BEARING RESTRICTIONS No  FALLS:  Has patient fallen in last 6 months? No  LIVING ENVIRONMENT:  Stairs: No Has following equipment at home: None  OCCUPATION: dance teacher  PLOF: Independent  PATIENT GOALS be able to dance without pain   OBJECTIVE:   DIAGNOSTIC FINDINGS: x ray shows  bilat OA in knees  PATIENT SURVEYS:  FOTO 58    EDEMA:  Edema present bilat knees     LOWER EXTREMITY ROM:  Active ROM Right eval Left eval  Hip flexion    Hip extension    Hip abduction    Hip adduction    Hip internal rotation    Hip external rotation    Knee flexion 128 137  Knee extension 0 0  Ankle dorsiflexion    Ankle plantarflexion 3+/5 4/5  Ankle inversion    Ankle eversion     (Blank rows = not tested)  LOWER EXTREMITY MMT:  MMT Right eval Left eval Right 01/21/22 Left  01/21/22  Hip flexion 4-/5 4/5 4/5 4+/5  Hip extension      Hip abduction 4-/5 4/5 4-/5 4+/5  Hip adduction      Hip internal rotation      Hip external rotation      Knee flexion 4-/5 4/5 4/5 4+/5  Knee extension 4-/5  4/5 4/5 5/5  Ankle dorsiflexion      Ankle plantarflexion      Ankle inversion      Ankle eversion       (Blank rows = not tested)    FUNCTIONAL TESTS:  Single leg stance: Rt 3 sec, Lt 12 sec Step down: increased valgus bilat with eccentric contraction Single leg heel raise: unable Rt, x 2 Lt      TODAY'S TREATMENT:  01/21/22 Therex: Nustep L5 x 8 min  Hamstring stretch sitting 30 sec hold x 2 reps  Abdominal bracing 10 sec x 10 reps  Bridge x 10 reps 5 sec hold SLR x 5 x 1 sets 3 sec hold bilat  SLR with ER x 5 x 1 sets 3 sec hold bilat Sidelying hip abd x 15 bilat  Sit to stand x 10 without UE support               SLS x 25 sec x 1; 30 sec x 2 reps bilat w/ no UE support              Balance SLS w/ fwd/side/back tap x 5 reps ea side              Heel tap lateral step up x 10 each side               Step up fwd x 10 reps x 2 sets alternating 6 inch step              Side steps green TB above knees x 10 ft x 2 reps bilat       PATIENT EDUCATION:  Education details: HEP, PT POC and goals Person educated: Patient Education method: Explanation, Demonstration, and Handouts Education comprehension: verbalized understanding and returned  demonstration   HOME EXERCISE PROGRAM: Access Code: [email or web address] URL: [email or web address] Date: 01/21/2022 Prepared by: Gillermo Murdoch  Exercises - Clamshell  - 1 x daily - 7 x weekly - 2 sets - 10 reps - Sidelying Hip Abduction  - 1 x daily - 7 x weekly - 2 sets - 10 reps - Active Straight Leg Raise with Quad Set  - 1 x daily - 7 x weekly - 2 sets - 10 reps - Straight Leg Raise with External Rotation  - 1 x daily - 7 x weekly - 2 sets - 10 reps - Single Leg Stance with Support  - 1 x daily - 7 x weekly - 1 sets - 10 reps - 10-20 sec hold - Beginner Bridge  - 1 x daily - 7 x weekly - 1 sets - 10 reps - 5 sec  hold - Sit to Stand  - 1 x daily - 7 x weekly - 1 sets - 10 reps - 3-5 sec  hold - Step Up  - 1 x daily - 7 x weekly - 1 sets - 10 reps - 2 sec  hold - Seated Hamstring Stretch  - 2 x daily - 7 x weekly - 1 sets - 3 reps - 30 sec  hold - Side Stepping with Resistance at Thighs  - 1 x daily - 7 x weekly   ASSESSMENT:  CLINICAL IMPRESSION:  Patient reports the she has soreness in Rt > Lt leg from dancing  over the weekend and then doing her exercises last night.  Patient demonstrated increased strength in bilat LE's. Again modified exercise during today's visit due to soreness but added standing hip abduction exercise with TB  d/t pain in the Rt hip in sidelying. Continued with strengthening, reviewing exercises.     01/08/22: Eval - Patient is a 76 y.o. female who was seen today for physical therapy evaluation and treatment for bilat knee OA and ankle strengthening. Pt presents with decreased strength, balance, mobility, decreased activity tolerance and increased pain. Pt will benefit from skilled PT to address deficits and improve functional mobility.    OBJECTIVE IMPAIRMENTS decreased activity tolerance, decreased balance, decreased endurance, decreased ROM, decreased strength, increased edema, and pain.   ACTIVITY LIMITATIONS standing, stairs, and locomotion  level  PARTICIPATION LIMITATIONS: meal prep, cleaning, and community activity  PERSONAL FACTORS Age and Fitness are also affecting patient's functional outcome.   REHAB POTENTIAL: Good  CLINICAL DECISION MAKING: Evolving/moderate complexity  EVALUATION COMPLEXITY: Moderate   GOALS: Goals reviewed with patient? Yes    LONG TERM GOALS: Target date: 02/19/2022   Pt will be independent with HEP Baseline:  Goal status: INITIAL  2.  Pt will improve FOTO to >= 72 to demo improved functional mobility Baseline:  Goal status: INITIAL  3.  Pt will improve bilat LE strength to 4+/5 to improve standing and gait tolerance Baseline:  Goal status: INITIAL  4.  Pt will tolerate return to dancing with pain <= 1/10 Baseline:  Goal status: INITIAL     PLAN: PT FREQUENCY: 2x/week  PT DURATION: 6 weeks  PLANNED INTERVENTIONS: Therapeutic exercises, Therapeutic activity, Neuromuscular re-education, Balance training, Gait training, Patient/Family education, Self Care, Joint mobilization, Stair training, Dry Needling, Electrical stimulation, Cryotherapy, Moist heat, and Taping  PLAN FOR NEXT SESSION: assess and progress HEP, balance and LE strength for knees and ankles   Rosio Weiss Nilda Simmer, PT, MPH  01/08/2022, 12:28 PM     [facility] [place] [address] [place], [place], [address] Phone: [phone]   Fax:  [place], [place], [address] Phone: [phone]   Fax:  [phone]    [place], [place], [address] Phone: [phone]   Fax:  [phone]  Physical Therapy Treatment  Patient Details  Name: Traci [name] MRN: [id number] Date of Birth: July 25, 1945 No data recorded  Encounter Date: 01/21/2022   PT End of Session - 01/21/22 1008      Visit Number 4    Number of Visits 12    Date for PT Re-Evaluation 02/19/22    Authorization Type Medicare    Authorization - Visit Number 4    Progress Note Due on Visit 10    PT Start Time 1009    PT Stop Time 1055    PT Time Calculation (min) 46 min    Activity Tolerance Patient tolerated treatment well             Past Medical History:  Diagnosis Date   Allergy 1995   Contrast dye   Anxiety    Atrial fibrillation (HCC)    CAD (coronary artery disease)    Claudication ([place]) 01/06/2008   Lower extremity dopplers - no evidence of arterial insufficiency, normal exam   Coronary artery disease    GERD (gastroesophageal reflux disease)    Heart murmur    Hyperlipidemia    Hypertension 11/30/2010   echo- EF 55%; normal w/ mildly sclerotic aortic valve   Hypertension 11/05/2011   renal dopplers - celiac artery and SMA >50% diameter reduction, R renal artery - mildly elevated velocities 1-59% diameter  reduction, L renal artery normal   Nonspecific ST-T wave electrocardiographic changes 03/26/2011   R/Lmv - EF 74%, normal perfusion all regions, ST depression w/ Lexiscan infusion w/o assoc angina   Osteopenia    PAD (peripheral artery disease) (HCC)    Peripheral vascular disease (HCC)    PVD (peripheral vascular disease) ([place]) 11/05/2011   doppler - R/L brachial pressures essentially equal w/o inflow disease; L sublclavian/CCA bypass graft demonstrates patent flow, no evidence of significant stenosis   Sigmoid diverticulitis     Past Surgical History:  Procedure Laterality Date   ABDOMINAL AORTIC ANEURYSM REPAIR N/A 01/27/2014   Procedure: AORTO-SUPERIOR MESENTERIC ARTERY BYPASS GRAFT;  Surgeon: Angelia Mould, MD;  Location: [facility];  Service: Vascular;  Laterality: N/A;   ABDOMINAL HYSTERECTOMY     APPENDECTOMY     CARDIAC CATHETERIZATION  06/03/2008   60% LAD, involving D2, borderline significant by IVUS, medical therapy. CFX, RCA OK.   CATARACT EXTRACTION      CHOLECYSTECTOMY     EYE SURGERY     retinal surgery   SMALL INTESTINE SURGERY     SPINE SURGERY  07/2009   SUBCLAVIAN ARTERY STENT  1995   X's  several   SUBCLAVIAN ARTERY STENT Left 09/20/2008   LSA ISR 7x69m Cordis Genesis on Opta premount, reduction from 80% to 0%   subclavian artery stents  01/23/2010   Left carotid to subclavian artery Bypass   VISCERAL ANGIOGRAM  01/17/2014   Procedure: VISCERAL ANGIOGRAM;  Surgeon: CAngelia Mould MD;  [facility];  Service: Cardiovascular;;    There were no vitals filed for this visit.                                            Patient will benefit from skilled therapeutic intervention in order to improve the following deficits and impairments:     Visit Diagnosis: Pain in left leg  Pain in right leg  Muscle weakness (generalized)     Problem List Patient Active Problem List   Diagnosis Date Noted   Bilateral leg pain 01/02/2022   Sprain of anterior talofibular ligament of left ankle 09/28/2020   Edema 09/03/2019   Epigastric pain 08/11/2019   Insomnia 08/10/2019   Hypocalcemia 06/03/2019   Atherosclerosis of aorta (HCC) 01/04/2018   Osteoporosis 12/25/2017   CKD (chronic kidney disease) stage 3, GFR 30-59 ml/min (HCC) 08/04/2017   Primary osteoarthritis of first carpometacarpal joint of left hand 07/10/2017   Strain of right Achilles tendon 04/14/2017   Atypical nevus 08/22/2016   Acute bilateral low back pain with left-sided sciatica 06/06/2016   Menopause 12/08/2015   History of colonoscopy 07/17/2015   Irregular heartbeat 07/26/2014   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 [place] [address]Phone: [phone]  Fax:  [phone]  Name: Traci [name] MRN: [id number] Date of Birth: [birth year]  [place] [address] [place] [address] [place], [place], [address] Phone: [phone]   Fax:  [phone]  Physical Therapy Treatment  Patient Details  Name: Traci [name] MRN: [id number] Date of Birth: July 21, 1945 No data recorded  Encounter Date: 01/21/2022   PT End of Session - 01/21/22 1008     Visit Number 4    Number of Visits 12    Date for PT Re-Evaluation 02/19/22    Authorization Type Medicare    Authorization - Visit Number 4    Progress Note Due on Visit 10    PT Start Time 1009    PT Stop Time 1055    PT Time Calculation (min) 46 min    Activity Tolerance Patient tolerated treatment well             Past Medical History:  Diagnosis Date   Allergy 1995   Contrast dye   Anxiety    Atrial fibrillation ([place])    CAD (coronary artery disease)    Claudication ([place]) 01/06/2008   Lower extremity dopplers - no evidence of arterial insufficiency, normal exam   Coronary artery disease    GERD (gastroesophageal reflux disease)    Heart murmur    Hyperlipidemia    Hypertension 11/30/2010   echo- EF 55%; normal w/ mildly sclerotic aortic valve   Hypertension 11/05/2011   renal dopplers - celiac artery and SMA >50% diameter reduction, R renal artery - mildly elevated velocities 1-59% diameter reduction, L renal artery normal   Nonspecific ST-T wave electrocardiographic changes 03/26/2011   R/Lmv - EF 74%, normal perfusion all  regions, ST depression w/ Lexiscan infusion w/o assoc angina   Osteopenia    PAD (peripheral artery disease) (HCC)    Peripheral vascular disease (HCC)    PVD (peripheral vascular disease) ([place]) 11/05/2011   doppler - R/L brachial pressures essentially equal w/o inflow disease; L sublclavian/CCA bypass graft demonstrates patent flow, no evidence of significant stenosis   Sigmoid diverticulitis     Past Surgical History:  Procedure Laterality Date   ABDOMINAL AORTIC ANEURYSM REPAIR N/A 01/27/2014   Procedure: AORTO-SUPERIOR MESENTERIC ARTERY BYPASS GRAFT;  Surgeon: Angelia Mould, MD;  [facility];  Service: Vascular;  Laterality: N/A;   ABDOMINAL HYSTERECTOMY     APPENDECTOMY     CARDIAC CATHETERIZATION  06/03/2008   60% LAD, involving D2, borderline significant by IVUS, medical therapy. CFX, RCA OK.   CATARACT EXTRACTION     CHOLECYSTECTOMY     EYE SURGERY     retinal surgery   SMALL INTESTINE SURGERY     SPINE SURGERY  07/2009   SUBCLAVIAN ARTERY STENT  1995   X's  several   SUBCLAVIAN ARTERY STENT Left 09/20/2008   LSA ISR 7x36m Cordis Genesis on Opta premount, reduction from 80% to 0%   subclavian artery stents  01/23/2010   Left carotid to subclavian artery Bypass   VISCERAL ANGIOGRAM  01/17/2014   Procedure: VISCERAL ANGIOGRAM;  Surgeon: CAngelia Mould MD;  [facility];  Service: Cardiovascular;;    There were no vitals filed for this visit.                                            Patient will benefit from skilled therapeutic intervention in  order to improve the following deficits and impairments:     Visit Diagnosis: Pain in left leg  Pain in right leg  Muscle weakness (generalized)     Problem List Patient Active Problem List   Diagnosis Date Noted   Bilateral leg pain 01/02/2022   Sprain of anterior talofibular ligament of left ankle 09/28/2020   Edema 09/03/2019   Epigastric pain  08/11/2019   Insomnia 08/10/2019   Hypocalcemia 06/03/2019   Atherosclerosis of aorta ([place]) 01/04/2018   Osteoporosis 12/25/2017   CKD (chronic kidney disease) stage 3, GFR 30-59 ml/min (HCC) 08/04/2017   Primary osteoarthritis of first carpometacarpal joint of left hand 07/10/2017   Strain of right Achilles tendon 04/14/2017   Atypical nevus 08/22/2016   Acute bilateral low back pain with left-sided sciatica 06/06/2016   Menopause 12/08/2015   History of colonoscopy 07/17/2015   Irregular heartbeat 07/26/2014   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[place]) 01/16/2014   Irritable bowel syndrome 09/21/2013   Hyperlipidemia 04/05/2013   Hypertension 12/06/2012   Peripheral arterial disease ([place]) 12/06/2012   CAD, cath 2009 12/06/2012   Hyponatremia, improved holding diuretic 12/06/2012   Lymphocele of left arm 07/14/2012   Atherosclerosis of other specified arteries 04/02/2012   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 [place], [place], [address] Phone: [phone]   Fax:  [phone]  Name: Traci [name] MRN: [id number] Date of Birth: 1946-05-05

## 2022-01-21 NOTE — Telephone Encounter (Signed)
Last RX was 11/09/2021 and last OV was 08/28/2021.  Please review refill request.  I am not authorized to either refuse or approve refill request for this medication.  Charyl Bigger, CMA

## 2022-01-23 ENCOUNTER — Ambulatory Visit: Payer: Medicare Other | Admitting: Rehabilitative and Restorative Service Providers"

## 2022-01-23 ENCOUNTER — Other Ambulatory Visit: Payer: Self-pay | Admitting: Cardiovascular Disease

## 2022-01-23 ENCOUNTER — Encounter: Payer: Self-pay | Admitting: Rehabilitative and Restorative Service Providers"

## 2022-01-23 DIAGNOSIS — M79604 Pain in right leg: Secondary | ICD-10-CM | POA: Diagnosis not present

## 2022-01-23 DIAGNOSIS — M79605 Pain in left leg: Secondary | ICD-10-CM | POA: Diagnosis not present

## 2022-01-23 DIAGNOSIS — M6281 Muscle weakness (generalized): Secondary | ICD-10-CM

## 2022-01-23 NOTE — Therapy (Signed)
[facility] [address] [place], [place], [address] Phone: [phone]   Fax:  [phone]  Patient Details  Name: Traci [name] MRN: [id number] Date of Birth: Mar 15, 1946 Referring Provider:  Silverio Decamp,*  Encounter Date: 01/23/2022  OUTPATIENT PHYSICAL THERAPY LOWER EXTREMITY EVALUATION   Patient Name: Traci [name] MRN: [id number] DOB:09/22/1945, 76 y.o., female Today's Date: 01/23/2022   PT End of Session - 01/23/22 1011     Visit Number 5    Number of Visits 12    Date for PT Re-Evaluation 02/19/22    Authorization Type Medicare    Authorization - Visit Number 5    Progress Note Due on Visit 10    PT Start Time 1011    PT Stop Time 1057    PT Time Calculation (min) 46 min    Activity Tolerance Patient tolerated treatment well             Past Medical History:  Diagnosis Date   Allergy 1995   Contrast dye   Anxiety    Atrial fibrillation ([place])    CAD (coronary artery disease)    Claudication ([place]) 01/06/2008   Lower extremity dopplers - no evidence of arterial insufficiency, normal exam   Coronary artery disease    GERD (gastroesophageal reflux disease)    Heart murmur    Hyperlipidemia    Hypertension 11/30/2010   echo- EF 55%; normal w/ mildly sclerotic aortic valve   Hypertension 11/05/2011   renal dopplers - celiac artery and SMA >50% diameter reduction, R renal artery - mildly elevated velocities 1-59% diameter reduction, L renal artery normal   Nonspecific ST-T wave electrocardiographic changes 03/26/2011   R/Lmv - EF 74%, normal perfusion all regions, ST depression w/ Lexiscan infusion w/o assoc angina   Osteopenia    PAD (peripheral artery disease) (HCC)    Peripheral vascular disease (HCC)    PVD (peripheral vascular disease) ([place]) 11/05/2011   doppler - R/L brachial pressures essentially equal w/o inflow disease; L sublclavian/CCA bypass graft  demonstrates patent flow, no evidence of significant stenosis   Sigmoid diverticulitis    Past Surgical History:  Procedure Laterality Date   ABDOMINAL AORTIC ANEURYSM REPAIR N/A 01/27/2014   Procedure: AORTO-SUPERIOR MESENTERIC ARTERY BYPASS GRAFT;  Surgeon: Angelia Mould, MD;  [facility];  Service: Vascular;  Laterality: N/A;   ABDOMINAL HYSTERECTOMY     APPENDECTOMY     CARDIAC CATHETERIZATION  06/03/2008   60% LAD, involving D2, borderline significant by IVUS, medical therapy. CFX, RCA OK.   CATARACT EXTRACTION     CHOLECYSTECTOMY     EYE SURGERY     retinal surgery   SMALL INTESTINE SURGERY     SPINE SURGERY  07/2009   SUBCLAVIAN ARTERY STENT  1995   X's  several   SUBCLAVIAN ARTERY STENT Left 09/20/2008   LSA ISR 7x16m Cordis Genesis on Opta premount, reduction from 80% to 0%   subclavian artery stents  01/23/2010   Left carotid to subclavian artery Bypass   VISCERAL ANGIOGRAM  01/17/2014   Procedure: VISCERAL ANGIOGRAM;  Surgeon: CAngelia Mould MD;  [facility];  Service: Cardiovascular;;   Patient Active Problem List   Diagnosis Date Noted   Bilateral leg pain 01/02/2022   Sprain of anterior talofibular ligament of left ankle 09/28/2020   Edema 09/03/2019   Epigastric pain 08/11/2019   Insomnia 08/10/2019   Hypocalcemia 06/03/2019   Atherosclerosis of aorta ([place] 01/04/2018  Osteoporosis 12/25/2017   CKD (chronic kidney disease) stage 3, GFR 30-59 ml/min (HCC) 08/04/2017   Primary osteoarthritis of first carpometacarpal joint of left hand 07/10/2017   Strain of right Achilles tendon 04/14/2017   Atypical nevus 08/22/2016   Acute bilateral low back pain with left-sided sciatica 06/06/2016   Menopause 12/08/2015   History of colonoscopy 07/17/2015   Irregular heartbeat 07/26/2014   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[place]) 01/16/2014   Irritable bowel syndrome 09/21/2013    Hyperlipidemia 04/05/2013   Hypertension 12/06/2012   Peripheral arterial disease ([place]) 12/06/2012   CAD, cath 2009 12/06/2012   Hyponatremia, improved holding diuretic 12/06/2012   Lymphocele of left arm 07/14/2012   Atherosclerosis of other specified arteries 04/02/2012   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 She had to take her neighbor to the ED last night and has only had about 2 hours sleep. LB and hips/knees are still sore. She continues to do her exercises on the bed instead of on the floor. (Wants to strengthen ankles but is unable to do heel raises due to bruising on foot from earlier trial of this exercise.)   Eval - 01/08/22: Pt states that for the past 6 weeks she feels her legs have [name] "giving out", Rt worse than Lt. She has difficulty standing on her Rt leg in single leg stance. She also states her arches of her feet have [name] "flattening" and she is getting orthotics from Dr. Darene Lamer. 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 Pt has noticed decreased balance during dance classes. Pain increases with bending knees and twisting. Pain decreases with her homemade muscle rub.  PERTINENT HISTORY: History of sciatica, osteopenia  PAIN:  Are you having pain? Yes:  pain in bilat knees 5/10 In the back of the leg, mostly Rt   PRECAUTIONS: None  WEIGHT BEARING RESTRICTIONS No  FALLS:  Has patient fallen in last 6 months? No  LIVING ENVIRONMENT:  Stairs: No Has following equipment at home: None  OCCUPATION: dance  teacher  PLOF: Independent  PATIENT GOALS be able to dance without pain   OBJECTIVE:   DIAGNOSTIC FINDINGS: x ray shows bilat OA in knees  PATIENT SURVEYS:  FOTO 58    EDEMA:  Edema present bilat knees     LOWER EXTREMITY ROM:  Active ROM Right eval Left eval  Hip flexion    Hip extension    Hip abduction    Hip adduction    Hip internal rotation    Hip external rotation    Knee flexion 128 137  Knee extension 0 0  Ankle dorsiflexion    Ankle plantarflexion 3+/5 4/5  Ankle inversion    Ankle eversion     (Blank rows = not tested)  LOWER EXTREMITY MMT:  MMT Right eval Left eval Right 01/21/22 Left  01/21/22  Hip flexion 4-/5 4/5 4/5 4+/5  Hip extension      Hip abduction 4-/5 4/5 4-/5 4+/5  Hip adduction      Hip  internal rotation      Hip external rotation      Knee flexion 4-/5 4/5 4/5 4+/5  Knee extension 4-/5 4/5 4/5 5/5  Ankle dorsiflexion      Ankle plantarflexion      Ankle inversion      Ankle eversion       (Blank rows = not tested)    FUNCTIONAL TESTS:  Single leg stance: Rt 3 sec, Lt 12 sec Step down: increased valgus bilat with eccentric contraction Single leg heel raise: unable Rt, x 2 Lt      TODAY'S TREATMENT:  01/23/22 Therex: Nustep L5 x 8 min  Hamstring stretch sitting 30 sec hold x 2 reps  Abdominal bracing 10 sec x 10 reps  Bridge x 10 reps 5 sec hold SLR x 5 x 1 sets 3 sec hold bilat  SLR with ER x 5 x 1 sets 3 sec hold bilat Sit to stand x 10 without UE support               SLS x 25 sec x 1; 30 sec x 2 reps bilat w/ no UE support              Balance SLS w/ fwd/side/back tap x 5 reps ea side              Heel tap lateral step up x 10 each side               Step up fwd x 10 reps x 2 sets alternating 6 inch step              Side steps green TB above knees x 10 ft x 3 reps bilat               Hip abduction standing green TB above knees               Diver reaching fwd to chair ~ 18 in x 10 each LE       PATIENT EDUCATION:  Education details: HEP, PT POC and goals Person educated: Patient Education method: Explanation, Demonstration, and Handouts Education comprehension: verbalized understanding and returned demonstration   HOME EXERCISE PROGRAM: Access Code: [email or web address] URL: [email or web address] Date: 01/23/2022 Prepared by: Gillermo Murdoch  Exercises - Clamshell  - 1 x daily - 7 x weekly - 2 sets - 10 reps - Sidelying Hip Abduction  - 1 x daily - 7 x weekly - 2 sets - 10 reps - Active Straight Leg Raise with Quad Set  - 1 x daily - 7 x weekly - 2 sets - 10 reps - Straight Leg Raise with External Rotation  - 1 x daily - 7 x weekly - 2 sets - 10 reps - Single Leg Stance with Support  - 1 x daily - 7 x weekly - 1 sets - 10 reps - 10-20 sec hold - Beginner Bridge  - 1 x daily - 7 x weekly - 1 sets - 10 reps - 5 sec  hold - Sit to Stand  - 1 x daily - 7 x weekly - 1 sets - 10 reps - 3-5 sec  hold - Step Up  - 1 x daily - 7 x weekly - 1 sets - 10 reps - 2 sec  hold - Seated Hamstring Stretch  - 2 x daily - 7 x weekly - 1 sets - 3 reps - 30 sec  hold - Side Stepping with Resistance at Thighs  -  1 x daily - 7 x weekly - Standing Hip Abduction with Resistance at Thighs  - 1 x daily - 7 x weekly - 2-3 sets - 10 reps - 3 sec  hold - The Diver  - 1 x daily - 7 x weekly - 1-2 sets - 10 reps - 2-3 sec  hold   ASSESSMENT:  CLINICAL IMPRESSION:  Patient reports the she has soreness in Lt LE more today - usually Rt > Lt leg. Continued with stretching and strengthening. Focus on standing and functional exercises/activities.    01/08/22: Eval - Patient is a 76 y.o. female who was seen today for physical therapy evaluation and treatment for bilat knee OA and ankle strengthening. Pt presents with decreased strength, balance, mobility, decreased activity tolerance and increased pain. Pt will benefit from skilled PT to address deficits and improve functional mobility.    OBJECTIVE  IMPAIRMENTS decreased activity tolerance, decreased balance, decreased endurance, decreased ROM, decreased strength, increased edema, and pain.   ACTIVITY LIMITATIONS standing, stairs, and locomotion level  PARTICIPATION LIMITATIONS: meal prep, cleaning, and community activity  PERSONAL FACTORS Age and Fitness are also affecting patient's functional outcome.   REHAB POTENTIAL: Good  CLINICAL DECISION MAKING: Evolving/moderate complexity  EVALUATION COMPLEXITY: Moderate   GOALS: Goals reviewed with patient? Yes    LONG TERM GOALS: Target date: 02/19/2022   Pt will be independent with HEP Baseline:  Goal status: INITIAL  2.  Pt will improve FOTO to >= 72 to demo improved functional mobility Baseline:  Goal status: INITIAL  3.  Pt will improve bilat LE strength to 4+/5 to improve standing and gait tolerance Baseline:  Goal status: INITIAL  4.  Pt will tolerate return to dancing with pain <= 1/10 Baseline:  Goal status: INITIAL     PLAN: PT FREQUENCY: 2x/week  PT DURATION: 6 weeks  PLANNED INTERVENTIONS: Therapeutic exercises, Therapeutic activity, Neuromuscular re-education, Balance training, Gait training, Patient/Family education, Self Care, Joint mobilization, Stair training, Dry Needling, Electrical stimulation, Cryotherapy, Moist heat, and Taping  PLAN FOR NEXT SESSION: assess and progress HEP, balance and LE strength for knees and ankles   Demarrio Menges Nilda Simmer, PT, MPH  01/08/2022, 12:28 PM     [facility] [address] [place], [place], [address] Phone: [phone]   Fax:  [place], [place], [address] Phone: [phone]   Fax:  [place], [place], [address] Phone: [phone]   Fax:  [phone]  Physical  Therapy Treatment  Patient Details  Name: MARCELLINE TEMKIN MRN: [id number] Date of Birth: [birth year] No data recorded  Encounter Date: 01/23/2022   PT End of Session - 01/23/22 1011     Visit Number 5    Number of Visits 12    Date for PT Re-Evaluation 02/19/22    Authorization Type Medicare    Authorization - Visit Number 5    Progress Note Due on Visit 10    PT Start Time 1011    PT Stop Time 1057    PT Time Calculation (min) 46 min    Activity Tolerance Patient tolerated treatment well             Past Medical History:  Diagnosis Date   Allergy 1995   Contrast dye   Anxiety    Atrial fibrillation (HCC)    CAD (coronary artery disease)  Claudication ([place]) 01/06/2008   Lower extremity dopplers - no evidence of arterial insufficiency, normal exam   Coronary artery disease    GERD (gastroesophageal reflux disease)    Heart murmur    Hyperlipidemia    Hypertension 11/30/2010   echo- EF 55%; normal w/ mildly sclerotic aortic valve   Hypertension 11/05/2011   renal dopplers - celiac artery and SMA >50% diameter reduction, R renal artery - mildly elevated velocities 1-59% diameter reduction, L renal artery normal   Nonspecific ST-T wave electrocardiographic changes 03/26/2011   R/Lmv - EF 74%, normal perfusion all regions, ST depression w/ Lexiscan infusion w/o assoc angina   Osteopenia    PAD (peripheral artery disease) (HCC)    Peripheral vascular disease (HCC)    PVD (peripheral vascular disease) ([place]) 11/05/2011   doppler - R/L brachial pressures essentially equal w/o inflow disease; L sublclavian/CCA bypass graft demonstrates patent flow, no evidence of significant stenosis   Sigmoid diverticulitis     Past Surgical History:  Procedure Laterality Date   ABDOMINAL AORTIC ANEURYSM REPAIR N/A 01/27/2014   Procedure: AORTO-SUPERIOR MESENTERIC ARTERY BYPASS GRAFT;  Surgeon: Angelia Mould, MD;  Location: [facility];  Service: Vascular;  Laterality: N/A;    ABDOMINAL HYSTERECTOMY     APPENDECTOMY     CARDIAC CATHETERIZATION  06/03/2008   60% LAD, involving D2, borderline significant by IVUS, medical therapy. CFX, RCA OK.   CATARACT EXTRACTION     CHOLECYSTECTOMY     EYE SURGERY     retinal surgery   SMALL INTESTINE SURGERY     SPINE SURGERY  07/2009   SUBCLAVIAN ARTERY STENT  1995   X's  several   SUBCLAVIAN ARTERY STENT Left 09/20/2008   LSA ISR 7x57m Cordis Genesis on Opta premount, reduction from 80% to 0%   subclavian artery stents  01/23/2010   Left carotid to subclavian artery Bypass   VISCERAL ANGIOGRAM  01/17/2014   Procedure: VISCERAL ANGIOGRAM;  Surgeon: CAngelia Mould MD;  [facility];  Service: Cardiovascular;;    There were no vitals filed for this visit.                                            Patient will benefit from skilled therapeutic intervention in order to improve the following deficits and impairments:     Visit Diagnosis: Pain in left leg  Pain in right leg  Muscle weakness (generalized)     Problem List Patient Active Problem List   Diagnosis Date Noted   Bilateral leg pain 01/02/2022   Sprain of anterior talofibular ligament of left ankle 09/28/2020   Edema 09/03/2019   Epigastric pain 08/11/2019   Insomnia 08/10/2019   Hypocalcemia 06/03/2019   Atherosclerosis of aorta ([place] 01/04/2018   Osteoporosis 12/25/2017   CKD (chronic kidney disease) stage 3, GFR 30-59 ml/min (HCC) 08/04/2017   Primary osteoarthritis of first carpometacarpal joint of left hand 07/10/2017   Strain of right Achilles tendon 04/14/2017   Atypical nevus 08/22/2016   Acute bilateral low back pain with left-sided sciatica 06/06/2016   Menopause 12/08/2015   History of colonoscopy 07/17/2015   Irregular heartbeat 07/26/2014   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[place] 01/16/2014   Irritable bowel syndrome  09/21/2013   Hyperlipidemia 04/05/2013   Hypertension 12/06/2012   Peripheral arterial disease ([place] 12/06/2012  CAD, cath 2009 12/06/2012   Hyponatremia, improved holding diuretic 12/06/2012   Lymphocele of left arm 07/14/2012   Atherosclerosis of other specified arteries 04/02/2012   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/02/2012   Stricture of artery ([place]) 10/02/2011   Allergic dermatitis 06/09/2011    Lynix Bonine Nilda Simmer, PT, MPH  01/23/2022, 10:52 AM  [facility] [place], [place], [address] Phone: [phone]   Fax:  [phone]  Name: Traci [name] MRN: [id number] Date of Birth: 03/26/1946  [place] [address] [place], [place], [address] Phone: [phone]   Fax:  [phone]  Physical Therapy Treatment  Patient Details  Name: MALAIYA PACZKOWSKI MRN: [id number] Date of Birth: March 11, 1946 No data recorded  Encounter Date: 01/23/2022   PT End of Session - 01/23/22 1011     Visit Number 5    Number of Visits 12    Date for PT Re-Evaluation 02/19/22    Authorization Type Medicare    Authorization - Visit Number 5    Progress Note Due on Visit 10    PT Start Time 1011    PT Stop Time 1057    PT Time Calculation (min) 46 min    Activity Tolerance Patient tolerated treatment well             Past Medical History:  Diagnosis Date   Allergy 1995   Contrast dye   Anxiety    Atrial fibrillation ([place])    CAD (coronary artery disease)    Claudication ([place]) 01/06/2008   Lower extremity dopplers - no evidence of arterial insufficiency, normal exam   Coronary artery disease    GERD (gastroesophageal reflux disease)    Heart murmur    Hyperlipidemia    Hypertension 11/30/2010   echo- EF 55%; normal w/ mildly sclerotic aortic valve   Hypertension 11/05/2011   renal dopplers - celiac artery and SMA >50% diameter  reduction, R renal artery - mildly elevated velocities 1-59% diameter reduction, L renal artery normal   Nonspecific ST-T wave electrocardiographic changes 03/26/2011   R/Lmv - EF 74%, normal perfusion all regions, ST depression w/ Lexiscan infusion w/o assoc angina   Osteopenia    PAD (peripheral artery disease) (HCC)    Peripheral vascular disease (HCC)    PVD (peripheral vascular disease) ([place]) 11/05/2011   doppler - R/L brachial pressures essentially equal w/o inflow disease; L sublclavian/CCA bypass graft demonstrates patent flow, no evidence of significant stenosis   Sigmoid diverticulitis     Past Surgical History:  Procedure Laterality Date   ABDOMINAL AORTIC ANEURYSM REPAIR N/A 01/27/2014   Procedure: AORTO-SUPERIOR MESENTERIC ARTERY BYPASS GRAFT;  Surgeon: Angelia Mould, MD;  [facility];  Service: Vascular;  Laterality: N/A;   ABDOMINAL HYSTERECTOMY     APPENDECTOMY     CARDIAC CATHETERIZATION  06/03/2008   60% LAD, involving D2, borderline significant by IVUS, medical therapy. CFX, RCA OK.   CATARACT EXTRACTION     CHOLECYSTECTOMY     EYE SURGERY     retinal surgery   SMALL INTESTINE SURGERY     SPINE SURGERY  07/2009   SUBCLAVIAN ARTERY STENT  1995   X's  several   SUBCLAVIAN ARTERY STENT Left 09/20/2008   LSA ISR 7x85m Cordis Genesis on Opta premount, reduction from 80% to 0%   subclavian artery stents  01/23/2010   Left carotid to subclavian artery Bypass   VISCERAL ANGIOGRAM  01/17/2014   Procedure:  VISCERAL ANGIOGRAM;  Surgeon: Angelia Mould, MD;  Location: [facility];  Service: Cardiovascular;;    There were no vitals filed for this visit.                                            Patient will benefit from skilled therapeutic intervention in order to improve the following deficits and impairments:     Visit Diagnosis: Pain in left leg  Pain in right leg  Muscle weakness  (generalized)     Problem List Patient Active Problem List   Diagnosis Date Noted   Bilateral leg pain 01/02/2022   Sprain of anterior talofibular ligament of left ankle 09/28/2020   Edema 09/03/2019   Epigastric pain 08/11/2019   Insomnia 08/10/2019   Hypocalcemia 06/03/2019   Atherosclerosis of aorta ([place]) 01/04/2018   Osteoporosis 12/25/2017   CKD (chronic kidney disease) stage 3, GFR 30-59 ml/min (HCC) 08/04/2017   Primary osteoarthritis of first carpometacarpal joint of left hand 07/10/2017   Strain of right Achilles tendon 04/14/2017   Atypical nevus 08/22/2016   Acute bilateral low back pain with left-sided sciatica 06/06/2016   Menopause 12/08/2015   History of colonoscopy 07/17/2015   Irregular heartbeat 07/26/2014   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[place]) 01/16/2014   Irritable bowel syndrome 09/21/2013   Hyperlipidemia 04/05/2013   Hypertension 12/06/2012   Peripheral arterial disease ([place]) 12/06/2012   CAD, cath 2009 12/06/2012   Hyponatremia, improved holding diuretic 12/06/2012   Lymphocele of left arm 07/14/2012   Atherosclerosis of other specified arteries 04/02/2012   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 [place], [place], [address] Phone: [phone]   Fax:  [phone]  Name: JAMMI MORRISSETTE MRN: [id number] Date of Birth: Mar 22, 1946  [facility] [place] [address] [place], [place], [address] Phone: [phone]   Fax:  [phone]  Physical Therapy Treatment  Patient Details  Name: SEBA MADOLE MRN: [id number] Date of Birth: [birth year] No data recorded  Encounter Date: 01/23/2022   PT End of Session - 01/23/22  1011     Visit Number 5    Number of Visits 12    Date for PT Re-Evaluation 02/19/22    Authorization Type Medicare    Authorization - Visit Number 5    Progress Note Due on Visit 10    PT Start Time 1011    PT Stop Time 1057    PT Time Calculation (min) 46 min    Activity Tolerance Patient tolerated treatment well             Past Medical History:  Diagnosis Date   Allergy 1995   Contrast dye   Anxiety    Atrial fibrillation (HCC)    CAD (coronary artery disease)    Claudication ([place]) 01/06/2008   Lower extremity dopplers - no evidence of arterial insufficiency, normal exam   Coronary artery disease    GERD (gastroesophageal reflux disease)    Heart murmur    Hyperlipidemia    Hypertension 11/30/2010   echo- EF 55%; normal w/ mildly sclerotic aortic valve   Hypertension 11/05/2011   renal dopplers - celiac  artery and SMA >50% diameter reduction, R renal artery - mildly elevated velocities 1-59% diameter reduction, L renal artery normal   Nonspecific ST-T wave electrocardiographic changes 03/26/2011   R/Lmv - EF 74%, normal perfusion all regions, ST depression w/ Lexiscan infusion w/o assoc angina   Osteopenia    PAD (peripheral artery disease) ([place])    Peripheral vascular disease (HCC)    PVD (peripheral vascular disease) ([place]) 11/05/2011   doppler - R/L brachial pressures essentially equal w/o inflow disease; L sublclavian/CCA bypass graft demonstrates patent flow, no evidence of significant stenosis   Sigmoid diverticulitis     Past Surgical History:  Procedure Laterality Date   ABDOMINAL AORTIC ANEURYSM REPAIR N/A 01/27/2014   Procedure: AORTO-SUPERIOR MESENTERIC ARTERY BYPASS GRAFT;  Surgeon: Angelia Mould, MD;  Location: [facility];  Service: Vascular;  Laterality: N/A;   ABDOMINAL HYSTERECTOMY     APPENDECTOMY     CARDIAC CATHETERIZATION  06/03/2008   60% LAD, involving D2, borderline significant by IVUS, medical therapy. CFX, RCA OK.   CATARACT  EXTRACTION     CHOLECYSTECTOMY     EYE SURGERY     retinal surgery   SMALL INTESTINE SURGERY     SPINE SURGERY  07/2009   SUBCLAVIAN ARTERY STENT  1995   X's  several   SUBCLAVIAN ARTERY STENT Left 09/20/2008   LSA ISR 7x62m Cordis Genesis on Opta premount, reduction from 80% to 0%   subclavian artery stents  01/23/2010   Left carotid to subclavian artery Bypass   VISCERAL ANGIOGRAM  01/17/2014   Procedure: VISCERAL ANGIOGRAM;  Surgeon: CAngelia Mould MD;  [facility];  Service: Cardiovascular;;    There were no vitals filed for this visit.                                            Patient will benefit from skilled therapeutic intervention in order to improve the following deficits and impairments:     Visit Diagnosis: Pain in left leg  Pain in right leg  Muscle weakness (generalized)     Problem List Patient Active Problem List   Diagnosis Date Noted   Bilateral leg pain 01/02/2022   Sprain of anterior talofibular ligament of left ankle 09/28/2020   Edema 09/03/2019   Epigastric pain 08/11/2019   Insomnia 08/10/2019   Hypocalcemia 06/03/2019   Atherosclerosis of aorta ([place] 01/04/2018   Osteoporosis 12/25/2017   CKD (chronic kidney disease) stage 3, GFR 30-59 ml/min (HCC) 08/04/2017   Primary osteoarthritis of first carpometacarpal joint of left hand 07/10/2017   Strain of right Achilles tendon 04/14/2017   Atypical nevus 08/22/2016   Acute bilateral low back pain with left-sided sciatica 06/06/2016   Menopause 12/08/2015   History of colonoscopy 07/17/2015   Irregular heartbeat 07/26/2014   Superior mesenteric artery stenosis ([facility] 01/27/2014   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[place] 01/16/2014   Irritable bowel syndrome 09/21/2013   Hyperlipidemia 04/05/2013   Hypertension 12/06/2012   Peripheral arterial disease ([place] 12/06/2012   CAD, cath 2009 12/06/2012   Hyponatremia, improved holding diuretic  12/06/2012   Lymphocele of left arm 07/14/2012   Atherosclerosis of other specified arteries 04/02/2012   Subclavian arterial stenosis ([facility] 04/02/2012   Stricture of artery ([facility] 10/02/2011   Allergic dermatitis 06/09/2011    [place] PNilda Simmer PT 01/23/2022, 10:52 AM  [facility] [address]  [place], [place], [address] Phone: [phone]   Fax:  [phone]  Name: BLAKLEIGH STRAW MRN: [id number] Date of Birth: 1945/12/02

## 2022-01-28 ENCOUNTER — Other Ambulatory Visit: Payer: Self-pay

## 2022-01-28 ENCOUNTER — Encounter: Payer: Self-pay | Admitting: Rehabilitative and Restorative Service Providers"

## 2022-01-28 ENCOUNTER — Ambulatory Visit: Payer: Medicare Other | Admitting: Rehabilitative and Restorative Service Providers"

## 2022-01-28 DIAGNOSIS — M6281 Muscle weakness (generalized): Secondary | ICD-10-CM | POA: Diagnosis not present

## 2022-01-28 DIAGNOSIS — M79604 Pain in right leg: Secondary | ICD-10-CM | POA: Diagnosis not present

## 2022-01-28 DIAGNOSIS — M79605 Pain in left leg: Secondary | ICD-10-CM

## 2022-01-28 MED ORDER — ZOLPIDEM TARTRATE 5 MG PO TABS: 5.0000 mg | ORAL_TABLET | Freq: Every evening | ORAL | 1 refills | Status: DC | PRN

## 2022-01-28 MED ORDER — CLORAZEPATE DIPOTASSIUM 7.5 MG PO TABS: 3.7500 mg | ORAL_TABLET | Freq: Every day | ORAL | 0 refills | Status: DC | PRN

## 2022-01-28 NOTE — Therapy (Signed)
[facility] [address] [place], [place], [address] Phone: [phone]   Fax:  [phone]  Patient Details  Name: Traci [name] MRN: [id number] Date of Birth: 1945/06/30 Referring Provider:  Silverio [name],*  Encounter Date: 01/28/2022  OUTPATIENT PHYSICAL THERAPY LOWER EXTREMITY EVALUATION   Patient Name: Traci [name] MRN: [id number] DOB:07/28/1945, 76 y.o., female Today's Date: 01/28/2022   PT End of Session - 01/28/22 1012     Visit Number 6    Number of Visits 12    Date for PT Re-Evaluation 02/19/22    Authorization Type Medicare    Authorization - Visit Number 6    Progress Note Due on Visit 10    PT Start Time 1010    PT Stop Time 1056    PT Time Calculation (min) 46 min    Activity Tolerance Patient tolerated treatment well             Past Medical History:  Diagnosis Date   Allergy 1995   Contrast dye   Anxiety    Atrial fibrillation (Traci [name])    CAD (coronary artery disease)    Claudication (Traci [name]) 01/06/2008   Lower extremity dopplers - no evidence of arterial insufficiency, normal exam   Coronary artery disease    GERD (gastroesophageal reflux disease)    Heart murmur    Hyperlipidemia    Hypertension 11/30/2010   echo- EF 55%; normal w/ mildly sclerotic aortic valve   Hypertension 11/05/2011   renal dopplers - celiac artery and SMA >50% diameter reduction, R renal artery - mildly elevated velocities 1-59% diameter reduction, L renal artery normal   Nonspecific ST-T wave electrocardiographic changes 03/26/2011   R/Lmv - EF 74%, normal perfusion all regions, ST depression w/ Lexiscan infusion w/o assoc angina   Osteopenia    PAD (peripheral artery disease) (HCC)    Peripheral vascular disease (HCC)    PVD (peripheral vascular disease) ([place]) 11/05/2011   doppler - R/L brachial pressures essentially equal w/o inflow disease; L sublclavian/CCA bypass graft  demonstrates patent flow, no evidence of significant stenosis   Sigmoid diverticulitis    Past Surgical History:  Procedure Laterality Date   ABDOMINAL AORTIC ANEURYSM REPAIR N/A 01/27/2014   Procedure: AORTO-SUPERIOR MESENTERIC ARTERY BYPASS GRAFT;  Surgeon: Traci Mould, MD;  Location: [facility] OR;  Service: Vascular;  Laterality: N/A;   ABDOMINAL HYSTERECTOMY     APPENDECTOMY     CARDIAC CATHETERIZATION  06/03/2008   60% LAD, involving D2, borderline significant by IVUS, medical therapy. CFX, RCA OK.   CATARACT EXTRACTION     CHOLECYSTECTOMY     EYE SURGERY     retinal surgery   SMALL INTESTINE SURGERY     SPINE SURGERY  07/2009   SUBCLAVIAN ARTERY STENT  1995   X's  several   SUBCLAVIAN ARTERY STENT Left 09/20/2008   LSA ISR 7x45m Cordis Genesis on Opta premount, reduction from 80% to 0%   subclavian artery stents  01/23/2010   Left carotid to subclavian artery Bypass   VISCERAL ANGIOGRAM  01/17/2014   Procedure: VISCERAL ANGIOGRAM;  Surgeon: CAngelia Mould MD;  [facility];  Service: Cardiovascular;;   Patient Active Problem List   Diagnosis Date Noted   Bilateral leg pain 01/02/2022   Sprain of anterior talofibular ligament of left ankle 09/28/2020   Edema 09/03/2019   Epigastric pain 08/11/2019   Insomnia 08/10/2019   Hypocalcemia 06/03/2019   Atherosclerosis of aorta ([facility] 01/04/2018  Osteoporosis 12/25/2017   CKD (chronic kidney disease) stage 3, GFR 30-59 ml/min (HCC) 08/04/2017   Primary osteoarthritis of first carpometacarpal joint of left hand 07/10/2017   Strain of right Achilles tendon 04/14/2017   Atypical nevus 08/22/2016   Acute bilateral low back pain with left-sided sciatica 06/06/2016   Menopause 12/08/2015   History of colonoscopy 07/17/2015   Irregular heartbeat 07/26/2014   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[place]) 01/16/2014   Irritable bowel syndrome 09/21/2013    Hyperlipidemia 04/05/2013   Hypertension 12/06/2012   Peripheral arterial disease ([place]) 12/06/2012   CAD, cath 2009 12/06/2012   Hyponatremia, improved holding diuretic 12/06/2012   Lymphocele of left arm 07/14/2012   Atherosclerosis of other specified arteries 04/02/2012   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 The shoes she tried felt good and she felt more stable walking. Traci [name] has worked on the exercises at home. Her knees are sore but she has less pain. She continues to do her exercises on the bed instead of on the floor. (Wants to strengthen ankles but is unable to do heel raises due to bruising on foot from earlier trial of this exercise.)   Eval - 01/08/22: Pt states that for the past 6 weeks she feels her legs have been "giving out", Rt worse than Lt. She has difficulty standing on her Rt leg in single leg stance. She also states her arches of her feet have been "flattening" and she is getting orthotics from Traci [name]. 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 Pt has noticed decreased balance during dance classes. Pain increases with bending knees and twisting. Pain decreases with her homemade muscle rub.  PERTINENT HISTORY: History of sciatica, osteopenia  PAIN:  Are you having pain? Yes:  pain in bilat knees 4/10 In the back of the leg, mostly Rt   PRECAUTIONS: None  WEIGHT BEARING RESTRICTIONS No  FALLS:  Has patient fallen in last 6 months? No  LIVING ENVIRONMENT:  Stairs: No Has  following equipment at home: None  OCCUPATION: dance teacher  PLOF: Independent  PATIENT GOALS be able to dance without pain   OBJECTIVE:   DIAGNOSTIC FINDINGS: x ray shows bilat OA in knees  PATIENT SURVEYS:  FOTO 58    EDEMA:  Edema present bilat knees     LOWER EXTREMITY ROM:  Active ROM Right eval Left eval  Hip flexion    Hip extension    Hip abduction    Hip adduction    Hip internal rotation    Hip external rotation    Knee flexion 128 137  Knee extension 0 0  Ankle dorsiflexion    Ankle plantarflexion 3+/5 4/5  Ankle inversion    Ankle eversion     (Blank rows = not tested)  LOWER EXTREMITY MMT:  MMT Right eval Left eval Right 01/21/22 Left  01/21/22  Hip flexion 4-/5 4/5 4/5 4+/5  Hip extension      Hip abduction 4-/5 4/5 4-/5 4+/5  Hip adduction      Hip internal rotation      Hip external rotation      Knee flexion 4-/5 4/5 4/5 4+/5  Knee extension 4-/5 4/5 4/5 5/5  Ankle dorsiflexion      Ankle plantarflexion      Ankle inversion      Ankle eversion       (Blank rows = not tested)    FUNCTIONAL TESTS:  Single leg stance: Rt 30 sec, Lt 30 sec Step down: increased valgus bilat with eccentric contraction Single leg heel raise:(unable due to bruising with heel raise)      TODAY'S TREATMENT:  01/23/22 Therex: Nustep L5 x 8 min  Hamstring stretch sitting 30 sec hold x 2 reps  Abdominal bracing 10 sec x 10 reps  Bridge x 10 reps 5 sec hold SLR with ER x 10 x 1 sets 3 sec hold bilat Sit to stand x 10 without UE support               SLS x 30 sec x 2 reps bilat w/ no UE support              Balance SLS w/ fwd/side/back tap x 5 reps ea side              SLS with tap fwd/side/back x 10 each side               Heel tap lateral step up x 10 each side               Step up fwd x 10 reps x 2 sets alternating 6 inch step              Side steps green TB above knees x 10 ft x 5 reps bilat               Hip abduction standing green TB  above knees x 10 x 2               Diver reaching fwd to chair ~ 18 in x 10 each LE              Tandem stance reaching fwd with UE's 30 sec x 2 ea side       PATIENT EDUCATION:  Education details: HEP, PT POC and goals Person educated: Patient Education method: Explanation, Demonstration, and Handouts Education comprehension: verbalized understanding and returned demonstration   HOME EXERCISE PROGRAM: Access Code: [email or web address] URL: [email or web address] Date: 01/28/2022 Prepared by: Gillermo Murdoch  Exercises - Clamshell  - 1 x daily - 7 x weekly - 2 sets - 10 reps - Sidelying Hip Abduction  - 1 x daily - 7 x weekly - 2 sets - 10 reps - Active Straight Leg Raise with Quad Set  - 1 x daily - 7 x weekly - 2 sets - 10 reps - Straight Leg Raise with External Rotation  - 1 x daily - 7 x weekly - 2 sets - 10 reps - Single Leg Stance with Support  - 1 x daily - 7 x weekly - 1 sets - 10 reps - 10-20 sec hold - Beginner Bridge  - 1 x daily - 7 x weekly - 1 sets - 10 reps - 5 sec  hold - Sit to Stand  - 1 x daily - 7 x weekly - 1 sets - 10 reps - 3-5 sec  hold - Step Up  - 1 x daily -  7 x weekly - 1 sets - 10 reps - 2 sec  hold - Seated Hamstring Stretch  - 2 x daily - 7 x weekly - 1 sets - 3 reps - 30 sec  hold - Side Stepping with Resistance at Thighs  - 1 x daily - 7 x weekly - Standing Hip Abduction with Resistance at Thighs  - 1 x daily - 7 x weekly - 2-3 sets - 10 reps - 3 sec  hold - The Diver  - 1 x daily - 7 x weekly - 1-2 sets - 10 reps - 2-3 sec  hold - Tandem Stance  - 1 x daily - 7 x weekly - 1 sets - 3 reps - 30 sec  hold   ASSESSMENT:  CLINICAL IMPRESSION:  Gradual progress with strengthening bilat LE's with patient reporting less pain. She has continued soreness and some discomfort in the hips and knees. Continued with stretching and strengthening. Focus on standing and functional exercises/activities.    01/08/22: Eval - Patient is a 76 y.o. female who was seen  today for physical therapy evaluation and treatment for bilat knee OA and ankle strengthening. Pt presents with decreased strength, balance, mobility, decreased activity tolerance and increased pain. Pt will benefit from skilled PT to address deficits and improve functional mobility.    OBJECTIVE IMPAIRMENTS decreased activity tolerance, decreased balance, decreased endurance, decreased ROM, decreased strength, increased edema, and pain.   ACTIVITY LIMITATIONS standing, stairs, and locomotion level  PARTICIPATION LIMITATIONS: meal prep, cleaning, and community activity  PERSONAL FACTORS Age and Fitness are also affecting patient's functional outcome.   REHAB POTENTIAL: Good  CLINICAL DECISION MAKING: Evolving/moderate complexity  EVALUATION COMPLEXITY: Moderate   GOALS: Goals reviewed with patient? Yes    LONG TERM GOALS: Target date: 02/19/2022   Pt will be independent with HEP Baseline:  Goal status: INITIAL  2.  Pt will improve FOTO to >= 72 to demo improved functional mobility Baseline:  Goal status: INITIAL  3.  Pt will improve bilat LE strength to 4+/5 to improve standing and gait tolerance Baseline:  Goal status: INITIAL  4.  Pt will tolerate return to dancing with pain <= 1/10 Baseline:  Goal status: INITIAL     PLAN: PT FREQUENCY: 2x/week  PT DURATION: 6 weeks  PLANNED INTERVENTIONS: Therapeutic exercises, Therapeutic activity, Neuromuscular re-education, Balance training, Gait training, Patient/Family education, Self Care, Joint mobilization, Stair training, Dry Needling, Electrical stimulation, Cryotherapy, Moist heat, and Taping  PLAN FOR NEXT SESSION: progress HEP, balance and LE strength for knees and ankles   Johnatan Baskette Nilda Simmer, PT, MPH  01/08/2022, 12:28 PM     [facility] [address] [place], [place], [address] Phone: [phone]   Fax:  [place], [place], [address] Phone: [phone]   Fax:  [phone]

## 2022-01-31 ENCOUNTER — Encounter: Payer: Medicare Other | Admitting: Rehabilitative and Restorative Service Providers"

## 2022-02-04 ENCOUNTER — Encounter: Payer: Self-pay | Admitting: Rehabilitative and Restorative Service Providers"

## 2022-02-04 ENCOUNTER — Other Ambulatory Visit: Payer: Self-pay

## 2022-02-04 ENCOUNTER — Ambulatory Visit: Payer: Medicare Other | Admitting: Rehabilitative and Restorative Service Providers"

## 2022-02-04 DIAGNOSIS — M79604 Pain in right leg: Secondary | ICD-10-CM

## 2022-02-04 DIAGNOSIS — M79605 Pain in left leg: Secondary | ICD-10-CM | POA: Diagnosis not present

## 2022-02-04 DIAGNOSIS — M6281 Muscle weakness (generalized): Secondary | ICD-10-CM

## 2022-02-04 NOTE — Therapy (Addendum)
[facility] [place], [place], [address] Phone: [phone]   Fax:  [phone]  Patient Details  Name: Traci [name] MRN: [id number] Date of Birth: 03-28-1946 Referring Provider:  Silverio Decamp,*  Encounter Date: 02/04/2022  OUTPATIENT PHYSICAL THERAPY LOWER EXTREMITY EVALUATION  PHYSICAL THERAPY DISCHARGE SUMMARY  Visits from Start of Care: 7  Current functional level related to goals / functional outcomes: See progress note. Excellent progress with LE strengthening   Remaining deficits: Needs to continue with exercise program to further increase strength and build endurance for functional activities and dancing.   Education / Equipment: HEP   Patient agrees to discharge. Patient goals were met. Patient is being discharged due to meeting the stated rehab goals.  Cyriah Childrey P. Helene Kelp PT, MPH 03/07/22 11:30 AM   Patient Name: Traci [name] MRN: [id number] DOB:Jul 02, 1945, 76 y.o., female Today's Date: 02/04/2022   PT End of Session - 02/04/22 1353     Visit Number 7    Number of Visits 12    Date for PT Re-Evaluation 02/19/22    Authorization Type Medicare    Authorization - Visit Number 7    Progress Note Due on Visit 10    PT Start Time 1350    PT Stop Time 8811    PT Time Calculation (min) 47 min    Activity Tolerance Patient tolerated treatment well             Past Medical History:  Diagnosis Date   Allergy 1995   Contrast dye   Anxiety    Atrial fibrillation ([place])    CAD (coronary artery disease)    Claudication ([place]) 01/06/2008   Lower extremity dopplers - no evidence of arterial insufficiency, normal exam   Coronary artery disease    GERD (gastroesophageal reflux disease)    Heart murmur    Hyperlipidemia    Hypertension 11/30/2010   echo- EF 55%; normal w/ mildly sclerotic aortic valve   Hypertension 11/05/2011   renal dopplers - celiac artery  and SMA >50% diameter reduction, R renal artery - mildly elevated velocities 1-59% diameter reduction, L renal artery normal   Nonspecific ST-T wave electrocardiographic changes 03/26/2011   R/Lmv - EF 74%, normal perfusion all regions, ST depression w/ Lexiscan infusion w/o assoc angina   Osteopenia    PAD (peripheral artery disease) (HCC)    Peripheral vascular disease (H[facility])    PVD (peripheral vascular disease) ([place]) 11/05/2011   doppler - R/L brachial pressures essentially equal w/o inflow disease; L sublclavian/CCA bypass graft demonstrates patent flow, no evidence of significant stenosis   Sigmoid diverticulitis    Past Surgical History:  Procedure Laterality Date   ABDOMINAL AORTIC ANEURYSM REPAIR N/A 01/27/2014   Procedure: AORTO-SUPERIOR MESENTERIC ARTERY BYPASS GRAFT;  Surgeon: Angelia Mould, MD;  Location: [facility];  Service: Vascular;  Laterality: N/A;   ABDOMINAL HYSTERECTOMY     APPENDECTOMY     CARDIAC CATHETERIZATION  06/03/2008   60% LAD, involving D2, borderline significant by IVUS, medical therapy. CFX, RCA OK.   CATARACT EXTRACTION     CHOLECYSTECTOMY     EYE SURGERY     retinal surgery   SMALL INTESTINE SURGERY     SPINE SURGERY  07/2009   SUBCLAVIAN ARTERY STENT  1995   X's  several   SUBCLAVIAN ARTERY STENT Left 09/20/2008   LSA ISR 7x25mm Cordis Genesis on Opta premount, reduction from 80% to 0%   subclavian artery stents  01/23/2010   Left carotid to subclavian artery Bypass   VISCERAL ANGIOGRAM  01/17/2014   Procedure: VISCERAL ANGIOGRAM;  Surgeon: Angelia Mould, MD;  Location: [facility] C[facility];  Service: Cardiovascular;;   Patient Active Problem List   Diagnosis Date Noted   Bilateral leg pain 01/02/2022   Sprain of anterior talofibular ligament of left ankle 09/28/2020   Edema 09/03/2019   Epigastric pain 08/11/2019   Insomnia 08/10/2019   Hypocalcemia 06/03/2019   Atherosclerosis of aorta ([place]) 01/04/2018   Osteoporosis 12/25/2017   CKD  (chronic kidney disease) stage 3, GFR 30-59 ml/min (HCC) 08/04/2017   Primary osteoarthritis of first carpometacarpal joint of left hand 07/10/2017   Strain of right Achilles tendon 04/14/2017   Atypical nevus 08/22/2016   Acute bilateral low back pain with left-sided sciatica 06/06/2016   Menopause 12/08/2015   History of colonoscopy 07/17/2015   Irregular heartbeat 07/26/2014   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[place]) 01/16/2014   Irritable bowel syndrome 09/21/2013   Hyperlipidemia 04/05/2013   Hypertension 12/06/2012   Peripheral arterial disease ([place]) 12/06/2012   CAD, cath 2009 12/06/2012   Hyponatremia, improved holding diuretic 12/06/2012   Lymphocele of left arm 07/14/2012   Atherosclerosis of other specified arteries 04/02/2012   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 The shoes fell good and she fells more stable walking. [name] has worked on the exercises at home. Her knees are sore but she has less pain; ankles are aching and sore. (Wants to strengthen ankles but is unable to do heel raises due to bruising on foot from earlier trial of this exercise.)   Eval - 01/08/22: Pt states that for the past 6 weeks she feels her legs have been "giving out", Rt worse than Lt. She has difficulty standing on her Rt leg in single leg stance. She also states her arches of her feet have been "flattening" and she is getting orthotics from Dr. Darene Lamer. 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 Pt has noticed decreased balance during dance classes. Pain increases with bending knees and twisting. Pain decreases with her homemade muscle rub.  PERTINENT HISTORY: History of sciatica, osteopenia  PAIN:  Are you having pain? Yes:  pain in bilat knees 3/10; ankles 6/10 Tightness in the back of the leg, mostly Rt   PRECAUTIONS: None  WEIGHT BEARING RESTRICTIONS No  FALLS:  Has patient fallen in last 6 months? No  LIVING ENVIRONMENT:  Stairs: No Has following equipment at home: None  OCCUPATION: dance teacher  PLOF: Independent  PATIENT GOALS be able to dance without pain   OBJECTIVE:   DIAGNOSTIC FINDINGS: x ray shows bilat OA in knees  PATIENT SURVEYS:  FOTO 58    EDEMA:  Edema present bilat knees     LOWER EXTREMITY ROM:  Active ROM Right eval Left eval  Hip flexion    Hip extension    Hip abduction    Hip adduction    Hip internal rotation    Hip external rotation  Knee flexion 128 137  Knee extension 0 0  Ankle dorsiflexion    Ankle plantarflexion 3+/5 4/5  Ankle inversion    Ankle eversion     (Blank rows = not tested)  LOWER EXTREMITY MMT:  MMT Right eval Left eval Right 01/21/22 Left  01/21/22  Hip flexion 4-/5 4/5 4/5 4+/5  Hip extension      Hip abduction 4-/5 4/5 4-/5 4+/5  Hip adduction      Hip internal rotation      Hip external rotation      Knee flexion 4-/5 4/5 4/5 4+/5  Knee extension 4-/5 4/5 4/5 5/5  Ankle dorsiflexion      Ankle plantarflexion      Ankle inversion      Ankle eversion       (Blank rows = not tested)    FUNCTIONAL TESTS:  Single leg stance: Rt 30 sec, Lt 30 sec Step down: increased valgus bilat with eccentric contraction Single leg heel raise:(unable due to bruising with heel raise)      TODAY'S TREATMENT:  02/04/22 Therex: Nustep L5 x 8 min  Hamstring stretch sitting 30 sec hold x 2 reps  Hamstring stretch supine w/strap 30 sec x 3 ea side  Abdominal bracing 10 sec x 10 reps   Hip abduction hooklying alternating LE's green TB x 10  Bridge green TB above knees x 10 reps 5 sec hold SLR x 10 x 1 sets 5 sec hold bilat Sit to stand x 5 no UE support hold in eccentric ranges               SLS blue cushion x 30 sec x 3 reps bilat w/ no UE support              Balance SLS w/ fwd/side/back tap x 5 reps ea side              SLS with tap fwd/side/back x 10 each side               Heel tap lateral step up x 10 each side               Step up fwd x 10 reps x 2 sets alternating 6 inch step              Side steps green TB above knees x 10 ft x 5 reps bilat               Hip abduction standing green TB above knees x 10 x 2               Diver reaching fwd to chair ~ 18 in x 10 each LE              Tandem stance reaching fwd with UE's 45 sec x 2 ea side               Hip extension green TB above knees x 10 ea side        PATIENT EDUCATION:  Education details: HEP, PT POC and goals Person educated: Patient Education method: Explanation, Demonstration, and Handouts Education comprehension: verbalized understanding and returned demonstration   HOME EXERCISE PROGRAM: Access Code: [email or web address] URL: [email or web address] Date: 01/28/2022 Prepared by: Gillermo Murdoch  Exercises - Clamshell  - 1 x daily - 7 x weekly - 2 sets - 10 reps - Sidelying Hip Abduction  - 1 x daily - 7 x weekly - 2 sets - 10  reps - Active Straight Leg Raise with Quad Set  - 1 x daily - 7 x weekly - 2 sets - 10 reps - Straight Leg Raise with External Rotation  - 1 x daily - 7 x weekly - 2 sets - 10 reps - Single Leg Stance with Support  - 1 x daily - 7 x weekly - 1 sets - 10 reps - 10-20 sec hold - Beginner Bridge  - 1 x daily - 7 x weekly - 1 sets - 10 reps - 5 sec  hold - Sit to Stand  - 1 x daily - 7 x weekly - 1 sets - 10 reps - 3-5 sec  hold - Step Up  - 1 x daily - 7 x weekly - 1 sets - 10 reps - 2 sec  hold - Seated Hamstring Stretch  - 2 x daily - 7 x weekly - 1 sets - 3 reps - 30 sec   hold - Side Stepping with Resistance at Thighs  - 1 x daily - 7 x weekly - Standing Hip Abduction with Resistance at Thighs  - 1 x daily - 7 x weekly - 2-3 sets - 10 reps - 3 sec  hold - The Diver  - 1 x daily - 7 x weekly - 1-2 sets - 10 reps - 2-3 sec  hold - Tandem Stance  - 1 x daily - 7 x weekly - 1 sets - 3 reps - 30 sec  hold   ASSESSMENT:  Balance:  SLS ~ 40-45 sec without UE support     CLINICAL IMPRESSION:  02/04/22: Patient is in more supportive tennis shoes. Significant improvement in balance demonstrated with single leg activities. Continued progress with strengthening bilat LE's. Patient reports some increased pain. She has continued soreness in hips and knees but no pain. She had discomfort in her ankles. Continued with stretching and strengthening. Focus on standing and functional exercises/activities.    01/08/22: Eval - Patient is a 76 y.o. female who was seen today for physical therapy evaluation and treatment for bilat knee OA and ankle strengthening. Pt presents with decreased strength, balance, mobility, decreased activity tolerance and increased pain. Pt will benefit from skilled PT to address deficits and improve functional mobility.    OBJECTIVE IMPAIRMENTS decreased activity tolerance, decreased balance, decreased endurance, decreased ROM, decreased strength, increased edema, and pain.   ACTIVITY LIMITATIONS standing, stairs, and locomotion level  PARTICIPATION LIMITATIONS: meal prep, cleaning, and community activity  PERSONAL FACTORS Age and Fitness are also affecting patient's functional outcome.   REHAB POTENTIAL: Good  CLINICAL DECISION MAKING: Evolving/moderate complexity  EVALUATION COMPLEXITY: Moderate   GOALS: Goals reviewed with patient? Yes    LONG TERM GOALS: Target date: 02/19/2022   Pt will be independent with HEP Baseline:  Goal status: INITIAL  2.  Pt will improve FOTO to >= 72 to demo improved functional mobility Baseline:  Goal  status: INITIAL  3.  Pt will improve bilat LE strength to 4+/5 to improve standing and gait tolerance Baseline:  Goal status: INITIAL  4.  Pt will tolerate return to dancing with pain <= 1/10 Baseline:  Goal status: INITIAL     PLAN: PT FREQUENCY: 2x/week  PT DURATION: 6 weeks  PLANNED INTERVENTIONS: Therapeutic exercises, Therapeutic activity, Neuromuscular re-education, Balance training, Gait training, Patient/Family education, Self Care, Joint mobilization, Stair training, Dry Needling, Electrical stimulation, Cryotherapy, Moist heat, and Taping  PLAN FOR NEXT SESSION: progress HEP, balance and LE strength for knees and  ankles   Deatrice Spanbauer Nilda Simmer, PT, MPH  01/08/2022, 12:28 PM     [facility] [place], [place], [address] Phone: [phone]   Fax:  [place], [place], [address] Phone: [phone]   Fax:  [phone]

## 2022-02-06 ENCOUNTER — Ambulatory Visit: Payer: Medicare Other | Admitting: Rehabilitative and Restorative Service Providers"

## 2022-02-07 ENCOUNTER — Encounter: Payer: Self-pay | Admitting: Family Medicine

## 2022-02-13 ENCOUNTER — Ambulatory Visit (INDEPENDENT_AMBULATORY_CARE_PROVIDER_SITE_OTHER): Payer: Medicare Other | Admitting: Sports Medicine

## 2022-02-13 DIAGNOSIS — M25561 Pain in right knee: Secondary | ICD-10-CM

## 2022-02-13 DIAGNOSIS — G8929 Other chronic pain: Secondary | ICD-10-CM | POA: Diagnosis not present

## 2022-02-13 DIAGNOSIS — I708 Atherosclerosis of other arteries: Secondary | ICD-10-CM | POA: Diagnosis not present

## 2022-02-13 DIAGNOSIS — M25562 Pain in left knee: Secondary | ICD-10-CM | POA: Diagnosis not present

## 2022-02-13 NOTE — Progress Notes (Addendum)
    Procedures performed today:    None.  Independent interpretation of notes and tests performed by another provider:   None.  Brief History, Exam, Impression, and Recommendations:    Bilateral knee pain This is a pleasant 76 year old female, we have been treating her for some time now for bilateral knee pain. She had initially preferred more of a nonpharmacologic approach, she was doing some physical therapy, she was also rubbing frankincense and coconut oil which she claims helps a lot. She also got some custom molded orthotics. X-rays were for the most part unrevealing with exception of some subchondral sclerosis bilaterally. She was doing well until recently where she had her knee gave out. On exam she has tenderness of the medial joint line right knee without effusion, good motion, good strength, due to failure of greater than 6 weeks of conservative treatment with mechanical symptoms including locking and giving out we will proceed with an MRI. I do suspect she has a meniscal tear medially. She really is somewhat against pharmacologic intervention including injections, so we will just have to have somewhat of a discussion when she comes back for MRI results.  Update: She will need additional custom molded orthotics, custom devices required for this patient as her anatomy is not accommodated by a prefabricated device and her knee pain and foot pain are expected to last longer than 6 months.    ____________________________________________ Gwen Her. Dianah Field, M.D., ABFM., CAQSM., AME. Primary Care and Sports Medicine [facility]  Adjunct Professor of [employer]  Risk manager

## 2022-02-13 NOTE — Assessment & Plan Note (Addendum)
This is a pleasant 76 year old female, we have been treating her for some time now for bilateral knee pain. She had initially preferred more of a nonpharmacologic approach, she was doing some physical therapy, she was also rubbing frankincense and coconut oil which she claims helps a lot. She also got some custom molded orthotics. X-rays were for the most part unrevealing with exception of some subchondral sclerosis bilaterally. She was doing well until recently where she had her knee gave out. On exam she has tenderness of the medial joint line right knee without effusion, good motion, good strength, due to failure of greater than 6 weeks of conservative treatment with mechanical symptoms including locking and giving out we will proceed with an MRI. I do suspect she has a meniscal tear medially. She really is somewhat against pharmacologic intervention including injections, so we will just have to have somewhat of a discussion when she comes back for MRI results.  Update: She will need additional custom molded orthotics, custom devices required for this patient as her anatomy is not accommodated by a prefabricated device and her knee pain and foot pain are expected to last longer than 6 months.

## 2022-02-16 ENCOUNTER — Ambulatory Visit (INDEPENDENT_AMBULATORY_CARE_PROVIDER_SITE_OTHER): Payer: Medicare Other

## 2022-02-16 DIAGNOSIS — M1711 Unilateral primary osteoarthritis, right knee: Secondary | ICD-10-CM | POA: Diagnosis not present

## 2022-02-16 DIAGNOSIS — M25562 Pain in left knee: Secondary | ICD-10-CM

## 2022-02-16 DIAGNOSIS — M25561 Pain in right knee: Secondary | ICD-10-CM

## 2022-02-16 DIAGNOSIS — S8991XA Unspecified injury of right lower leg, initial encounter: Secondary | ICD-10-CM | POA: Diagnosis not present

## 2022-02-16 DIAGNOSIS — M25461 Effusion, right knee: Secondary | ICD-10-CM | POA: Diagnosis not present

## 2022-02-16 DIAGNOSIS — G8929 Other chronic pain: Secondary | ICD-10-CM | POA: Diagnosis not present

## 2022-02-20 ENCOUNTER — Other Ambulatory Visit: Payer: Self-pay | Admitting: Sports Medicine

## 2022-02-20 DIAGNOSIS — H43813 Vitreous degeneration, bilateral: Secondary | ICD-10-CM | POA: Diagnosis not present

## 2022-02-20 DIAGNOSIS — H35371 Puckering of macula, right eye: Secondary | ICD-10-CM | POA: Diagnosis not present

## 2022-02-20 DIAGNOSIS — H3581 Retinal edema: Secondary | ICD-10-CM | POA: Diagnosis not present

## 2022-02-20 DIAGNOSIS — H31091 Other chorioretinal scars, right eye: Secondary | ICD-10-CM | POA: Diagnosis not present

## 2022-02-20 DIAGNOSIS — G8929 Other chronic pain: Secondary | ICD-10-CM

## 2022-02-20 MED ORDER — MELOXICAM 15 MG PO TABS: ORAL_TABLET | ORAL | 3 refills | Status: DC

## 2022-02-26 ENCOUNTER — Telehealth: Payer: Self-pay | Admitting: Cardiovascular Disease

## 2022-02-26 ENCOUNTER — Telehealth: Payer: Self-pay | Admitting: *Deleted

## 2022-02-26 NOTE — Telephone Encounter (Signed)
Pt agreeable to plan of care for tele pre op appt 03/04/22 @ 2 pm. Med rec and consent are done. Pt asked for the soonest appt that we had since she has been waiting. See notes from earlier today.

## 2022-02-26 NOTE — Telephone Encounter (Signed)
Pt is calling wanting to schedule an appt for a pre-op clearance. She states that she is having surgery soon and that office sent over clearance request twice with no reply. Requesting call back to see if this was received.

## 2022-02-26 NOTE — Telephone Encounter (Signed)
Primary Cardiologist:Mihai Croitoru, MD   Preoperative team, please contact this patient and set up a phone call appointment to provide cardiac medical clearance. Clearance to hold antiplatelet medications will need to go to Dr. Scot Dock, [facility] as she has no cardiac stents for further preoperative risk assessment. Please obtain consent and complete medication review. Thank you for your help.   Emmaline Life, NP-C     02/26/2022, 12:53 PM [address] Office [phone] Fax [phone]

## 2022-02-26 NOTE — Telephone Encounter (Signed)
Left a message for the pt to call back for tele pre op appt.

## 2022-02-26 NOTE — Telephone Encounter (Signed)
Pt agreeable to plan of care for tele pre op appt 03/04/22 @ 2 pm. Med rec and consent are done. Pt asked for the soonest appt that we had since she has been waiting. See notes from earlier today.      Patient Consent for Virtual Visit        Traci [name] has provided verbal consent on 02/26/2022 for a virtual visit (video or telephone).   CONSENT FOR VIRTUAL VISIT FOR:  Traci [name]  By participating in this virtual visit I agree to the following:  I hereby voluntarily request, consent and authorize [facility] and its employed or contracted physicians, physician assistants, nurse practitioners or other licensed health care professionals (the Practitioner), to provide me with telemedicine health care services (the "Services") as deemed necessary by the treating Practitioner. I acknowledge and consent to receive the Services by the Practitioner via telemedicine. I understand that the telemedicine visit will involve communicating with the Practitioner through live audiovisual communication technology and the disclosure of certain medical information by electronic transmission. I acknowledge that I have been given the opportunity to request an in-person assessment or other available alternative prior to the telemedicine visit and am voluntarily participating in the telemedicine visit.  I understand that I have the right to withhold or withdraw my consent to the use of telemedicine in the course of my care at any time, without affecting my right to future care or treatment, and that the Practitioner or I may terminate the telemedicine visit at any time. I understand that I have the right to inspect all information obtained and/or recorded in the course of the telemedicine visit and may receive copies of available information for a reasonable fee.  I understand that some of the potential risks of receiving the Services via telemedicine include:  Delay or interruption  in medical evaluation due to technological equipment failure or disruption; Information transmitted may not be sufficient (e.g. poor resolution of images) to allow for appropriate medical decision making by the Practitioner; and/or  In rare instances, security protocols could fail, causing a breach of personal health information.  Furthermore, I acknowledge that it is my responsibility to provide information about my medical history, conditions and care that is complete and accurate to the best of my ability. I acknowledge that Practitioner's advice, recommendations, and/or decision may be based on factors not within their control, such as incomplete or inaccurate data provided by me or distortions of diagnostic images or specimens that may result from electronic transmissions. I understand that the practice of medicine is not an exact science and that Practitioner makes no warranties or guarantees regarding treatment outcomes. I acknowledge that a copy of this consent can be made available to me via my patient portal ([facility]), or I can request a printed copy by calling the office of [facility].    I understand that my insurance will be billed for this visit.   I have read or had this consent read to me. I understand the contents of this consent, which adequately explains the benefits and risks of the Services being provided via telemedicine.  I have been provided ample opportunity to ask questions regarding this consent and the Services and have had my questions answered to my satisfaction. I give my informed consent for the services to be provided through the use of telemedicine in my medical care

## 2022-02-26 NOTE — Telephone Encounter (Signed)
   Pre-operative Risk Assessment    Patient Name: Traci [name]  DOB: November 25, 1945 MRN: [id number]      Request for Surgical Clearance    Procedure:   RETINAL SURGERY  Date of Surgery:  Clearance TBD (NEEDS TO BE DONE IN THE NEXT 1-4 WEEKS)                                Surgeon:  DR. Sherlynn Stalls  Surgeon's Group or Practice Name:  [facility] Phone number:  [phone] Fax number:  [phone]   Type of Clearance Requested:   - Medical ; PLAVIX AND ASA INSTRUCTIONS NEEDED   Type of Anesthesia:  MAC   Additional requests/questions:    Jiles Prows   02/26/2022, 11:29 AM

## 2022-02-26 NOTE — Telephone Encounter (Signed)
I s/w the pt who tells me that Dr. Baird Cancer office sent over a clearance x 2. I stated that I have not seen a clearance for her since Feb/March 2023. I did state the request most likely went over to the [facility] location. I did give me fax# [phone] and she can have Dr. Baird Cancer office fax clearance to me and I will get it to the pre op provider. Pt thanked me for the call and the help. Once I have clearance I will forward to pre op.

## 2022-02-28 ENCOUNTER — Encounter: Payer: Self-pay | Admitting: Family Medicine

## 2022-02-28 ENCOUNTER — Ambulatory Visit (INDEPENDENT_AMBULATORY_CARE_PROVIDER_SITE_OTHER): Payer: Medicare Other | Admitting: Family Medicine

## 2022-02-28 VITALS — BP 124/64 | HR 65 | Wt 147.0 lb

## 2022-02-28 DIAGNOSIS — I10 Essential (primary) hypertension: Secondary | ICD-10-CM | POA: Diagnosis not present

## 2022-02-28 DIAGNOSIS — I708 Atherosclerosis of other arteries: Secondary | ICD-10-CM | POA: Diagnosis not present

## 2022-02-28 DIAGNOSIS — G4700 Insomnia, unspecified: Secondary | ICD-10-CM | POA: Diagnosis not present

## 2022-02-28 DIAGNOSIS — I471 Supraventricular tachycardia: Secondary | ICD-10-CM

## 2022-02-28 DIAGNOSIS — D649 Anemia, unspecified: Secondary | ICD-10-CM | POA: Diagnosis not present

## 2022-02-28 DIAGNOSIS — I4719 Other supraventricular tachycardia: Secondary | ICD-10-CM

## 2022-02-28 NOTE — Progress Notes (Unsigned)
   Established Patient Office Visit  Subjective   Patient ID: Traci [name], female    DOB: 1946-03-06  Age: 76 y.o. MRN: [id number]  Chief Complaint  Patient presents with   Hypertension    HPI  F/U insomnia -she says she still rarely taking the Ambien though we did refill it in August he said she really did not need it but they had put on automatic refill.  Hypertension- Pt denies chest pain, SOB, dizziness, or heart palpitations.  Taking meds as directed w/o problems.  Denies medication side effects.    She is also scheduled to have eye surgery this fall for some scar tissue that is built up from her previous surgery.  Did go and get shoe inserts to help with some of her foot pain.  She was also given a prescription for meloxicam but says she really has not tried it yet.   {History (Optional):23778}  ROS    Objective:     BP (!) 141/48   Pulse 65   Wt 147 lb (66.7 kg)   SpO2 98%   BMI 25.23 kg/m  {Vitals History (Optional):23777}  Physical Exam   No results found for any visits on 02/28/22.  {Labs (Optional):23779}  The ASCVD Risk score (Arnett DK, et al., 2019) failed to calculate for the following reasons:   The valid total cholesterol range is 130 to 320 mg/dL   Unable to determine if patient is Non-Hispanic African American    Assessment & Plan:   Problem List Items Addressed This Visit       Cardiovascular and Mediastinum   Hypertension (Chronic)   Relevant Orders   BASIC METABOLIC PANEL WITH GFR   CBC     Other   Insomnia - Primary    Return in about 6 months (around 08/29/2022) for BP and Lseep and .    Beatrice Lecher, MD

## 2022-03-01 ENCOUNTER — Encounter: Payer: Self-pay | Admitting: Family Medicine

## 2022-03-01 DIAGNOSIS — I4719 Other supraventricular tachycardia: Secondary | ICD-10-CM | POA: Insufficient documentation

## 2022-03-01 DIAGNOSIS — I471 Supraventricular tachycardia: Secondary | ICD-10-CM | POA: Insufficient documentation

## 2022-03-01 NOTE — Assessment & Plan Note (Signed)
Currently on atenolol for rate control and doing well.

## 2022-03-01 NOTE — Progress Notes (Signed)
Hi [name], globin is dropped a little bit from a year ago.  Can you see if we can add an iron panel to your labs.  Hopefully they have enough blood that they could add it on.  About panel looks okay.  Call lab and see if they can add an iron panel.

## 2022-03-01 NOTE — Assessment & Plan Note (Signed)
We did have a long discussion today about the Tranxene and the Ambien.  She knows not to combine them she really just takes the Ambien occasionally.  We had received a refill request earlier this month and we did fill it but she said she did not need it she thinks it may have been on auto refill but she did pick it up.  So she really does not need any refills at all soon we also had a long discussion about tapering the Tranxene and really working on using a half a tab so that we can start to decrease her dose to 3.75 mg warned about potential side effects these medications including increased sedation, risk of falls and dementia.

## 2022-03-01 NOTE — Assessment & Plan Note (Signed)
Pressure looks fantastic.

## 2022-03-04 ENCOUNTER — Ambulatory Visit: Payer: Medicare Other | Attending: Interventional Cardiology | Admitting: Physician Assistant

## 2022-03-04 DIAGNOSIS — Z01818 Encounter for other preprocedural examination: Secondary | ICD-10-CM | POA: Diagnosis not present

## 2022-03-04 NOTE — Progress Notes (Addendum)
Virtual Visit via Telephone Note   Because of Traci [name]'s co-morbid illnesses, she is at least at moderate risk for complications without adequate follow up.  This format is felt to be most appropriate for this patient at this time.  The patient did not have access to video technology/had technical difficulties with video requiring transitioning to audio format only (telephone).  All issues noted in this document were discussed and addressed.  No physical exam could be performed with this format.  Please refer to the patient's chart for her consent to telehealth for [facility].  Evaluation Performed:  Preoperative cardiovascular risk assessment _____________   Date:  03/04/2022   Patient ID:  Traci [name], DOB March 23, 1946, MRN [id number] Patient Location:  Home Provider location:   Office  Primary Care Provider:  Hali Marry, MD Primary Cardiologist:  Sanda Klein, MD  Chief Complaint / Patient Profile   76 y.o. y/o female with a h/o PAD, mild nonobstructive coronary arthrosclerosis, hyperlipidemia, and hypertension who is pending retinal surgery and presents today for telephonic preoperative cardiovascular risk assessment.  Past Medical History    Past Medical History:  Diagnosis Date   Allergy 1995   Contrast dye   Anxiety    Atrial fibrillation (HCC)    CAD (coronary artery disease)    Claudication ([place]) 01/06/2008   Lower extremity dopplers - no evidence of arterial insufficiency, normal exam   Coronary artery disease    GERD (gastroesophageal reflux disease)    Heart murmur    Hyperlipidemia    Hypertension 11/30/2010   echo- EF 55%; normal w/ mildly sclerotic aortic valve   Hypertension 11/05/2011   renal dopplers - celiac artery and SMA >50% diameter reduction, R renal artery - mildly elevated velocities 1-59% diameter reduction, L renal artery normal   Nonspecific ST-T wave electrocardiographic changes 03/26/2011    R/Lmv - EF 74%, normal perfusion all regions, ST depression w/ Lexiscan infusion w/o assoc angina   Osteopenia    PAD (peripheral artery disease) (HCC)    Peripheral vascular disease (HCC)    PVD (peripheral vascular disease) ([place]) 11/05/2011   doppler - R/L brachial pressures essentially equal w/o inflow disease; L sublclavian/CCA bypass graft demonstrates patent flow, no evidence of significant stenosis   Sigmoid diverticulitis    Past Surgical History:  Procedure Laterality Date   ABDOMINAL AORTIC ANEURYSM REPAIR N/A 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 CFX, RCA OK.   CATARACT EXTRACTION     CHOLECYSTECTOMY     EYE SURGERY     retinal surgery   SMALL INTESTINE SURGERY     SPINE SURGERY  07/2009   SUBCLAVIAN ARTERY STENT  1995   X's  several   SUBCLAVIAN ARTERY STENT Left 09/20/2008   LSA ISR 7x71m Cordis Genesis on Opta premount, reduction from 80% to 0%   subclavian artery stents  01/23/2010   Left carotid to subclavian artery Bypass   VISCERAL ANGIOGRAM  01/17/2014   Procedure: VISCERAL ANGIOGRAM;  Surgeon: CAngelia Mould MD;  [facility];  Service: Cardiovascular;;    Allergies  Allergies  Allergen Reactions   Cortisone Anaphylaxis   Dilaudid [Hydromorphone Hcl] Nausea And Vomiting    Pt  states she will start vomiting immediately for 6 hours   Iodine Anaphylaxis   Medrol [Methylprednisolone] Anaphylaxis   Omnipaque [Iohexol] Anaphylaxis   Prednisone Anaphylaxis and Swelling   Shellfish Allergy Anaphylaxis   Sulfa Drugs Cross Reactors Anaphylaxis   Z-Pak [Azithromycin] Swelling   Doxycycline     Thrush/itching   Ivp Dye [Iodinated Contrast Media]     Other  reaction(s): Unknown   Methylprednisolone Sodium Succ Swelling    Blood pressure drops immediately   Sulfa Antibiotics Other (See Comments)    Stomach upset   Augmentin [Amoxicillin-Pot Clavulanate] Nausea Only    Upset stomach (Has taken cephalexin in the past without problems)   Codeine Rash and Other (See Comments)    Upset stomach   Erythromycin Rash   Morphine And Related Nausea Only    nausea    History of Present Illness    Traci [name] is a 76 y.o. female who presents via [employer], civil (consulting) for a telehealth visit today.  Pt was last seen in cardiology clinic on 11/20/2021 by Dr. Sallyanne Kuster  At that time Traci [name] was doing well .  The patient is now pending procedure as outlined above. Since her last visit, she everything has been fine from a cardiac standpoint.  She has not had any shortness of breath, chest pain, or palpitations.  She goes shag dancing every week.  She just did some serious spring cleaning this past weekend and cleaned out all of her closets.  She has no issues with walking or stairs.  For this reason she is scored a 5.81 METS on the DASI.  This exceeds the minimum 4 METS requirement.  Clearance to hold antiplatelet medications will need to go to Dr. Scot Dock, [facility] as she has no cardiac stents   Home Medications    Prior to Admission medications   Medication Sig Start Date End Date Taking? Authorizing Provider  aspirin EC 81 MG tablet Take 81 mg by mouth daily.    [provider]  atenolol (TENORMIN) 25 MG tablet TAKE 1 TABLET DAILY 01/23/22   Croitoru, Mihai, MD  atorvastatin (LIPITOR) 10 MG tablet TAKE 1 TABLET DAILY AT 6 P.M. 07/06/21   Croitoru, Mihai, MD  candesartan (ATACAND) 16 MG tablet TAKE ONE-HALF (1/2) TABLET DAILY 07/06/21   Croitoru, Mihai, MD  cholecalciferol (VITAMIN D) 1000 UNITS tablet Take 1,000 Units by mouth daily.    [provider]  clopidogrel (PLAVIX) 75 MG tablet TAKE 1 TABLET DAILY 07/06/21    Croitoru, Mihai, MD  clorazepate (TRANXENE) 7.5 MG tablet Take 0.5-1 tablets (3.75-7.5 mg total) by mouth daily as needed. 01/28/22   Hali Marry, MD  cyanocobalamin 1000 MCG tablet Take 1,000 mcg by mouth daily.    [provider]  EPINEPHrine (EPIPEN 2-PAK) 0.3 mg/0.3 mL IJ SOAJ injection Inject 0.3 mLs (0.3 mg total) into the muscle as needed (for allergic reaction). 12/23/17   Gregor Hams, MD  esomeprazole (NEXIUM) 40 MG capsule Take 1 capsule (40 mg total) by mouth daily. Take 1 tab daily 02/23/15   Croitoru, Mihai, MD  estradiol (ESTRACE) 0.5 MG tablet TAKE 1 TABLET DAILY 04/18/21   Hali Marry, MD  furosemide (LASIX) 20 MG tablet TAKE 1 TABLET EVERY OTHER DAY 10/23/21   Croitoru, Dani Gobble, MD  linaclotide (LINZESS) 145 MCG CAPS capsule Take 145 mcg by mouth 2 (two) times a week. Sunday and wednesday 12/22/15   [provider]  meclizine (ANTIVERT) 25 MG tablet TAKE ONE-HALF (1/2)  TABLET THREE TIMES A DAY AS NEEDED FOR DIZZINESS OR NAUSEA 10/29/21   Hali Marry, MD  meloxicam (MOBIC) 15 MG tablet One tab PO every 24 hours with a meal for 2 weeks, then once every 24 hours prn pain. 02/20/22   Silverio Decamp, MD  Multiple Vitamin (MULTIVITAMIN WITH MINERALS) TABS Take 1 tablet by mouth daily.    [provider]  zolpidem (AMBIEN) 5 MG tablet Take 1 tablet (5 mg total) by mouth at bedtime as needed for sleep. 01/28/22   Hali Marry, MD    Physical Exam    Vital Signs:  Traci [name] does not have vital signs available for review today.  Given telephonic nature of communication, physical exam is limited. AAOx3. NAD. Normal affect.  Speech and respirations are unlabored.  Accessory Clinical Findings    None  Assessment & Plan    1.  Preoperative Cardiovascular Risk Assessment:  Traci [name] perioperative risk of a major cardiac event is 0.4% according to the Revised Cardiac Risk Index (RCRI).  Therefore,  she is at low risk for perioperative complications.   Her functional capacity is good at 5.81 METs according to the [place] Activity Status Index (DASI). Recommendations: According to ACC/AHA guidelines, no further cardiovascular testing needed.  The patient may proceed to surgery at acceptable risk.    A copy of this note will be routed to requesting surgeon.  Time:   Today, I have spent 10 minutes with the patient with telehealth technology discussing medical history, symptoms, and management plan.     Elgie Collard, PA-C  03/04/2022, 1:17 PM

## 2022-03-06 LAB — IRON,TIBC AND FERRITIN PANEL
%SAT: 23 % (calc) (ref 16–45)
Ferritin: 56 ng/mL (ref 16–288)
Iron: 68 ug/dL (ref 45–160)
TIBC: 296 mcg/dL (calc) (ref 250–450)

## 2022-03-06 LAB — CBC
HCT: 32.8 % — ABNORMAL LOW (ref 35.0–45.0)
Hemoglobin: 11.2 g/dL — ABNORMAL LOW (ref 11.7–15.5)
MCH: 30.7 pg (ref 27.0–33.0)
MCHC: 34.1 g/dL (ref 32.0–36.0)
MCV: 89.9 fL (ref 80.0–100.0)
MPV: 9.1 fL (ref 7.5–12.5)
Platelets: 303 10*3/uL (ref 140–400)
RBC: 3.65 10*6/uL — ABNORMAL LOW (ref 3.80–5.10)
RDW: 11.7 % (ref 11.0–15.0)
WBC: 5.7 10*3/uL (ref 3.8–10.8)

## 2022-03-06 LAB — BASIC METABOLIC PANEL WITH GFR
BUN: 15 mg/dL (ref 7–25)
CO2: 29 mmol/L (ref 20–32)
Calcium: 8.7 mg/dL (ref 8.6–10.4)
Chloride: 101 mmol/L (ref 98–110)
Creat: 0.94 mg/dL (ref 0.60–1.00)
Glucose, Bld: 114 mg/dL — ABNORMAL HIGH (ref 65–99)
Potassium: 4.8 mmol/L (ref 3.5–5.3)
Sodium: 136 mmol/L (ref 135–146)
eGFR: 63 mL/min/{1.73_m2} (ref >=60)

## 2022-03-06 NOTE — Progress Notes (Signed)
Hi Traci [name], your iron panel looks good.

## 2022-03-14 DIAGNOSIS — H3581 Retinal edema: Secondary | ICD-10-CM | POA: Diagnosis not present

## 2022-03-14 DIAGNOSIS — H35371 Puckering of macula, right eye: Secondary | ICD-10-CM | POA: Diagnosis not present

## 2022-03-14 DIAGNOSIS — H35341 Macular cyst, hole, or pseudohole, right eye: Secondary | ICD-10-CM | POA: Diagnosis not present

## 2022-03-14 DIAGNOSIS — H33321 Round hole, right eye: Secondary | ICD-10-CM | POA: Diagnosis not present

## 2022-03-15 ENCOUNTER — Encounter: Payer: Self-pay | Admitting: Family Medicine

## 2022-03-22 DIAGNOSIS — H35371 Puckering of macula, right eye: Secondary | ICD-10-CM | POA: Diagnosis not present

## 2022-03-22 DIAGNOSIS — H3581 Retinal edema: Secondary | ICD-10-CM | POA: Diagnosis not present

## 2022-03-25 ENCOUNTER — Encounter: Payer: Self-pay | Admitting: Family Medicine

## 2022-03-25 DIAGNOSIS — K838 Other specified diseases of biliary tract: Secondary | ICD-10-CM | POA: Diagnosis not present

## 2022-03-25 DIAGNOSIS — K8689 Other specified diseases of pancreas: Secondary | ICD-10-CM | POA: Diagnosis not present

## 2022-04-10 ENCOUNTER — Other Ambulatory Visit: Payer: Self-pay | Admitting: Family Medicine

## 2022-04-12 DIAGNOSIS — H35371 Puckering of macula, right eye: Secondary | ICD-10-CM | POA: Diagnosis not present

## 2022-04-12 DIAGNOSIS — H3581 Retinal edema: Secondary | ICD-10-CM | POA: Diagnosis not present

## 2022-04-13 ENCOUNTER — Other Ambulatory Visit: Payer: Self-pay | Admitting: Family Medicine

## 2022-04-24 ENCOUNTER — Encounter: Payer: Self-pay | Admitting: Family Medicine

## 2022-04-24 ENCOUNTER — Ambulatory Visit (INDEPENDENT_AMBULATORY_CARE_PROVIDER_SITE_OTHER): Payer: Medicare Other | Admitting: Family Medicine

## 2022-04-24 VITALS — BP 137/49 | HR 58 | Ht 64.0 in | Wt 148.5 lb

## 2022-04-24 DIAGNOSIS — R52 Pain, unspecified: Secondary | ICD-10-CM | POA: Diagnosis not present

## 2022-04-24 DIAGNOSIS — Z8616 Personal history of COVID-19: Secondary | ICD-10-CM

## 2022-04-24 DIAGNOSIS — R5383 Other fatigue: Secondary | ICD-10-CM

## 2022-04-24 DIAGNOSIS — I708 Atherosclerosis of other arteries: Secondary | ICD-10-CM

## 2022-04-24 DIAGNOSIS — E559 Vitamin D deficiency, unspecified: Secondary | ICD-10-CM

## 2022-04-24 DIAGNOSIS — I499 Cardiac arrhythmia, unspecified: Secondary | ICD-10-CM | POA: Diagnosis not present

## 2022-04-24 DIAGNOSIS — I498 Other specified cardiac arrhythmias: Secondary | ICD-10-CM

## 2022-04-24 DIAGNOSIS — M810 Age-related osteoporosis without current pathological fracture: Secondary | ICD-10-CM

## 2022-04-24 LAB — CBC WITH DIFFERENTIAL/PLATELET
Absolute Monocytes: 322 cells/uL (ref 200–950)
Basophils Absolute: 41 cells/uL (ref 0–200)
Basophils Relative: 1.1 %
Eosinophils Absolute: 181 cells/uL (ref 15–500)
Eosinophils Relative: 4.9 %
HCT: 35.1 % (ref 35.0–45.0)
Hemoglobin: 11.8 g/dL (ref 11.7–15.5)
Lymphs Abs: 1299 cells/uL (ref 850–3900)
MCH: 30.2 pg (ref 27.0–33.0)
MCHC: 33.6 g/dL (ref 32.0–36.0)
MCV: 89.8 fL (ref 80.0–100.0)
MPV: 8.8 fL (ref 7.5–12.5)
Monocytes Relative: 8.7 %
Neutro Abs: 1857 cells/uL (ref 1500–7800)
Neutrophils Relative %: 50.2 %
Platelets: 350 10*3/uL (ref 140–400)
RBC: 3.91 10*6/uL (ref 3.80–5.10)
RDW: 11.8 % (ref 11.0–15.0)
Total Lymphocyte: 35.1 %
WBC: 3.7 10*3/uL — ABNORMAL LOW (ref 3.8–10.8)

## 2022-04-24 LAB — COMPLETE METABOLIC PANEL WITH GFR
AG Ratio: 1.5 (calc) (ref 1.0–2.5)
ALT: 14 U/L (ref 6–29)
AST: 20 U/L (ref 10–35)
Albumin: 4.2 g/dL (ref 3.6–5.1)
Alkaline phosphatase (APISO): 51 U/L (ref 37–153)
BUN: 12 mg/dL (ref 7–25)
CO2: 29 mmol/L (ref 20–32)
Calcium: 8.7 mg/dL (ref 8.6–10.4)
Chloride: 100 mmol/L (ref 98–110)
Creat: 0.88 mg/dL (ref 0.60–1.00)
Globulin: 2.8 g/dL (calc) (ref 1.9–3.7)
Glucose, Bld: 111 mg/dL — ABNORMAL HIGH (ref 65–99)
Potassium: 4.7 mmol/L (ref 3.5–5.3)
Sodium: 135 mmol/L (ref 135–146)
Total Bilirubin: 0.5 mg/dL (ref 0.2–1.2)
Total Protein: 7 g/dL (ref 6.1–8.1)
eGFR: 68 mL/min/{1.73_m2} (ref >=60)

## 2022-04-24 LAB — SEDIMENTATION RATE: Sed Rate: 9 mm/h (ref 0–30)

## 2022-04-24 LAB — VITAMIN D 25 HYDROXY (VIT D DEFICIENCY, FRACTURES): Vit D, 25-Hydroxy: 27 ng/mL — ABNORMAL LOW (ref 30–100)

## 2022-04-24 NOTE — Assessment & Plan Note (Addendum)
Due for repeat DEXA. she is due for repeat DEXA as well as vitamin D level.

## 2022-04-24 NOTE — Progress Notes (Signed)
Established Patient Office Visit  Subjective   Patient ID: Traci [name], female    DOB: [birth year]  Age: 76 y.o. MRN: [id number]  Chief Complaint  Patient presents with   Generalized Body Aches   Palpitations    HPI  Had a COVID vaccine and the next day had chills, nausea dn diarrhea.  She also had severe body aches that only lasted a couple days. Additional  sxs lasted about a week and then she started to feel better for a couple of days.. Since then has had daily HA and daily chills( worse at night). Notices heart fluttering mostly at night, not as much during the daytime.  She has chronic SOB.  No throat pain but she feels hoarse.  NO chest pain.  Some occ ear pain.  Says she is never actually had a true fever.  Just feels really tired and a little bit of brain fog.  She did test for COVID at home twice just to make sure that she had not actually picked up the virus.  She had done well with her previous COVID injections and had not had any side effects.    ROS    Objective:     BP (!) 137/49   Pulse (!) 58   Ht '5\' 4"'$  (1.626 m)   Wt 148 lb 8 oz (67.4 kg)   SpO2 95%   BMI 25.49 kg/m    Physical Exam Vitals and nursing note reviewed.  Constitutional:      Appearance: She is well-developed.  HENT:     Head: Normocephalic and atraumatic.  Cardiovascular:     Rate and Rhythm: Normal rate and regular rhythm.     Heart sounds: Normal heart sounds.  Pulmonary:     Effort: Pulmonary effort is normal.     Breath sounds: Normal breath sounds.  Skin:    General: Skin is warm and dry.  Neurological:     Mental Status: She is alert and oriented to person, place, and time.  Psychiatric:        Behavior: Behavior normal.      No results found for any visits on 04/24/22.    The ASCVD Risk score (Arnett DK, et al., 2019) failed to calculate for the following reasons:   The valid total cholesterol range is 130 to 320 mg/dL   Unable to determine if patient is  Non-Hispanic African American    Assessment & Plan:   Problem List Items Addressed This Visit       Musculoskeletal and Integument   Osteoporosis    Due for repeat DEXA. she is due for repeat DEXA as well as vitamin D level.      Relevant Orders   DG Bone Density   VITAMIN D 25 Hydroxy (Vit-D Deficiency, Fractures)     Other   Irregular heartbeat (Chronic)    We will do an EKG today.      Relevant Orders   COMPLETE METABOLIC PANEL WITH GFR   CBC with Differential/Platelet   Sedimentation rate   Other Visit Diagnoses     Fluttering heart    -  Primary   Relevant Orders   COMPLETE METABOLIC PANEL WITH GFR   CBC with Differential/Platelet   Sedimentation rate   History of COVID-19       Generalized body aches       Other fatigue       Relevant Orders   COMPLETE METABOLIC PANEL WITH GFR   CBC with  Differential/Platelet   Sedimentation rate   Vitamin D deficiency       Relevant Orders   VITAMIN D 25 Hydroxy (Vit-D Deficiency, Fractures)      EKG today shows rate of 52 bpm, normal sinus rhythm no bradycardia.  No acute changes compared to prior EKG from February 2023.  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  Symptoms certainly could possibly be from the vaccine.  Also consider other viral illnesses as a possibility.  She is always tolerated them well in the past.  We will check for anemia and inflammation.  Will check for elevated white blood cell counts that she still having some nightly chills.  EKG was reassuring.   Return if symptoms worsen or fail to improve.    Beatrice Lecher, MD

## 2022-04-24 NOTE — Progress Notes (Signed)
Pt reports that she received her Covid vaccine 2 weeks ago (04/01/2022) 1 day later she began having body aches, chills,headaches, nausea, diarrhea, and palpitations.

## 2022-04-24 NOTE — Assessment & Plan Note (Signed)
We will do an EKG today.

## 2022-04-24 NOTE — Addendum Note (Signed)
Addended by: Beatrice Lecher D on: 04/24/2022 11:12 AM   Modules accepted: Orders

## 2022-04-25 ENCOUNTER — Encounter: Payer: Self-pay | Admitting: Family Medicine

## 2022-04-25 NOTE — Progress Notes (Signed)
Hi Traci [name], your metabolic panel overall looks good.  Kidney function is stable.  Hemoglobin looks better compared to last month.  Your white blood cells are little on the lower end normally you are between 4-6 this time it was 3.7.  I do not keep an eye on that and plan to recheck it again in a month.  Vitamin D levels are still low.  Better than they were 2 years ago but still low.  If you are not taking vitamin D then recommend 25 mcg daily.  If you are taking vitamin D then double your dose and we can recheck again in 3 months.  Your inflammatory marker is negative.  No indication of autoimmune.

## 2022-04-30 NOTE — Telephone Encounter (Signed)
Attempted call to cardiology . Held for 12 minutes without answer.

## 2022-05-02 ENCOUNTER — Encounter: Payer: Self-pay | Admitting: Nurse Practitioner

## 2022-05-02 ENCOUNTER — Ambulatory Visit: Payer: Medicare Other | Attending: Nurse Practitioner | Admitting: Nurse Practitioner

## 2022-05-02 VITALS — BP 145/70 | HR 62 | Ht 64.0 in | Wt 151.4 lb

## 2022-05-02 DIAGNOSIS — I10 Essential (primary) hypertension: Secondary | ICD-10-CM | POA: Diagnosis not present

## 2022-05-02 DIAGNOSIS — R002 Palpitations: Secondary | ICD-10-CM

## 2022-05-02 DIAGNOSIS — I2510 Atherosclerotic heart disease of native coronary artery without angina pectoris: Secondary | ICD-10-CM | POA: Insufficient documentation

## 2022-05-02 DIAGNOSIS — R9431 Abnormal electrocardiogram [ECG] [EKG]: Secondary | ICD-10-CM | POA: Diagnosis not present

## 2022-05-02 NOTE — Progress Notes (Signed)
Cardiology Office Note:    Date:  05/02/2022   ID:  Traci [name], DOB 1945-10-25, MRN [id number]  PCP:  Hali Marry, MD   [facility] Providers Cardiologist:  Sanda Klein, MD     Referring MD: Hali Marry, *   Chief Complaint: abnormal EKG per PCP  History of Present Illness:    Traci [name] is a pleasant 76 y.o. female with a hx of extensive history of peripheral arterial disease involving the mesenteric circulation and upper extremities, mild nonobstructive coronary artery disease, HTN, and HLD  Moderate CAD by coronary angiography 2009. Previous stents and surgical revascularization of the left subclavian artery for subclavian steal syndrome and presyncope.  She eventually underwent a left carotid to subclavian bypass. She underwent bypass from the aorta to the chronically totally occluded superficial mesenteric artery due to symptoms of chronic mesenteric ischemia by Dr. Scot Dock in August 2015.   Ultrasound 05/06/2020 showed a widely patent superior mesenteric artery bypass graft.  Carotid duplex 05/2021 showed bilateral stenosis 1 to 39% with patent left subclavian artery bypass graft.  Normal nuclear stress test in 2020 in [place] and in March and 2022 in [place]. Echocardiogram in [place] in March 2022 showed LVEF 60 to 74%, normal diastolic function, no valvular abnormalities.  She reports anaphylaxis in response to contrast administration in the past but she is also allergic to Solu-Medrol and prednisone which makes her "pass out."  Cardiac catheterization performed with exclusively Benadryl as premedication without adverse events.  Last cardiology clinic visit was 11/20/2021 with Dr. Sallyanne Kuster. She was recovering from recent pancreatic duct dilatation.  Reported her stamina was not what it was in the past.  She enjoys shag dancing and becomes dizzy more easily when she stands.  Does not feel limited by intermittent  claudication while dancing, although she has described claudication after walking about half a mile.No changes were made to her treatment regimen and 1 year follow-up was recommended.  Today, she is here for evaluation of abnormal EKG from PCP and palpitations. Reports immediately after COVID booster, she had fatigue, chills, diarrhea. Also having palpitations that occur when lying on left or right side. Does not feel them at other times. They gradually subside and she is able to sleep with no problems. After several days of no improvement in symptoms with Tylenol, she went to see PCP.  The symptoms of fatigue chills and diarrhea are improving but the palpitations continue. Dr. Madilyn Fireman noted Q waves on her EKG in leads V1-V3 which she felt were new from previous tracing 07/2021.  She reports she has not had any chest pain, shortness of breath, or activity intolerance since she last saw Dr. Sallyanne Kuster. Has pain and bloating in her abdomen where mesenteric artery was repaired. Bowels are normal, takes Linzess if she goes more than 2 days without BM. Home BP typically 128, diastolics are low normal. She is a retired Marine scientist.   Past Medical History:  Diagnosis Date   Allergy 1995   Contrast dye   Anxiety    Atrial fibrillation (HCC)    CAD (coronary artery disease)    Claudication ([place]) 01/06/2008   Lower extremity dopplers - no evidence of arterial insufficiency, normal exam   Coronary artery disease    GERD (gastroesophageal reflux disease)    Heart murmur    Hyperlipidemia    Hypertension 11/30/2010   echo- EF 55%; normal w/ mildly sclerotic aortic valve   Hypertension 11/05/2011   renal dopplers - celiac  artery and SMA >50% diameter reduction, R renal artery - mildly elevated velocities 1-59% diameter reduction, L renal artery normal   Nonspecific ST-T wave electrocardiographic changes 03/26/2011   R/Lmv - EF 74%, normal perfusion all regions, ST depression w/ Lexiscan infusion w/o assoc angina    Osteopenia    PAD (peripheral artery disease) ([place])    Peripheral vascular disease (HCC)    PVD (peripheral vascular disease) ([place]) 11/05/2011   doppler - R/L brachial pressures essentially equal w/o inflow disease; L sublclavian/CCA bypass graft demonstrates patent flow, no evidence of significant stenosis   Sigmoid diverticulitis     Past Surgical History:  Procedure Laterality Date   ABDOMINAL AORTIC ANEURYSM REPAIR N/A 01/27/2014   Procedure: AORTO-SUPERIOR MESENTERIC ARTERY BYPASS GRAFT;  Surgeon: Angelia Mould, MD;  Location: [place];  Service: Vascular;  Laterality: N/A;   ABDOMINAL HYSTERECTOMY     APPENDECTOMY     CARDIAC CATHETERIZATION  06/03/2008   60% LAD, involving D2, borderline significant by IVUS, medical therapy. CFX, RCA OK.   CATARACT EXTRACTION     CHOLECYSTECTOMY     EYE SURGERY     retinal surgery   SMALL INTESTINE SURGERY     SPINE SURGERY  07/2009   SUBCLAVIAN ARTERY STENT  1995   X's  several   SUBCLAVIAN ARTERY STENT Left 09/20/2008   LSA ISR 7x52m Cordis Genesis on Opta premount, reduction from 80% to 0%   subclavian artery stents  01/23/2010   Left carotid to subclavian artery Bypass   VISCERAL ANGIOGRAM  01/17/2014   Procedure: VISCERAL ANGIOGRAM;  Surgeon: CAngelia Mould MD;  [facility];  Service: Cardiovascular;;    Current Medications: Current Meds  Medication Sig   aspirin EC 81 MG tablet Take 81 mg by mouth daily.   atenolol (TENORMIN) 25 MG tablet TAKE 1 TABLET DAILY   atorvastatin (LIPITOR) 10 MG tablet TAKE 1 TABLET DAILY AT 6 P.M.   candesartan (ATACAND) 16 MG tablet TAKE ONE-HALF (1/2) TABLET DAILY   cholecalciferol (VITAMIN D) 1000 UNITS tablet Take 1,000 Units by mouth daily.   clopidogrel (PLAVIX) 75 MG tablet TAKE 1 TABLET DAILY   clorazepate (TRANXENE) 7.5 MG tablet TAKE ONE-HALF (1/2) TO ONE TABLET DAILY AS NEEDED   cyanocobalamin 1000 MCG tablet Take 1,000 mcg by mouth daily.   EPINEPHrine (EPIPEN  2-PAK) 0.3 mg/0.3 mL IJ SOAJ injection Inject 0.3 mLs (0.3 mg total) into the muscle as needed (for allergic reaction).   esomeprazole (NEXIUM) 40 MG capsule Take 1 capsule (40 mg total) by mouth daily. Take 1 tab daily   estradiol (ESTRACE) 0.5 MG tablet TAKE 1 TABLET DAILY   furosemide (LASIX) 20 MG tablet TAKE 1 TABLET EVERY OTHER DAY   linaclotide (LINZESS) 145 MCG CAPS capsule Take 145 mcg by mouth as needed. constipation   meclizine (ANTIVERT) 25 MG tablet TAKE ONE-HALF (1/2) TABLET THREE TIMES A DAY AS NEEDED FOR DIZZINESS OR NAUSEA   meloxicam (MOBIC) 15 MG tablet One tab PO every 24 hours with a meal for 2 weeks, then once every 24 hours prn pain.   Multiple Vitamin (MULTIVITAMIN WITH MINERALS) TABS Take 1 tablet by mouth daily.   zolpidem (AMBIEN) 5 MG tablet Take 1 tablet (5 mg total) by mouth at bedtime as needed for sleep.     Allergies:   Cortisone, Dilaudid [hydromorphone hcl], Iodine, Medrol [methylprednisolone], Omnipaque [iohexol], Prednisone, Shellfish allergy, Sulfa drugs cross reactors, Z-pak [azithromycin], Doxycycline, Ivp dye [iodinated contrast media], Methylprednisolone sodium succ,  Sulfa antibiotics, Augmentin [amoxicillin-pot clavulanate], Codeine, Erythromycin, and Morphine and related   Social History   Socioeconomic History   Marital status: Widowed    Spouse name: Not on file   Number of children: 1   Years of education: 4   Highest education level: Associate degree: academic program  Occupational History   Occupation: retired    Comment: Marine scientist  Tobacco Use   Smoking status: Former    Packs/day: 0.25    Years: 20.00    Total pack years: 5.00    Types: Cigarettes    Quit date: 09/30/1993    Years since quitting: 28.6   Smokeless tobacco: Never  Vaping Use   Vaping Use: Never used  Substance and Sexual Activity   Alcohol use: No   Drug use: No   Sexual activity: Not Currently  Other Topics Concern   Not on file  Social History Narrative   Lives  with her son. Likes to go dancing and likes play online games such as scrabble.    Social Determinants of Health   Financial Resource Strain: Low Risk  (07/20/2021)   Overall Financial Resource Strain (CARDIA)    Difficulty of Paying Living Expenses: Not hard at all  Food Insecurity: No Food Insecurity (07/20/2021)   Hunger Vital Sign    Worried About Running Out of Food in the Last Year: Never true    Ran Out of Food in the Last Year: Never true  Transportation Needs: No Transportation Needs (07/20/2021)   PRAPARE - Hydrologist (Medical): No    Lack of Transportation (Non-Medical): No  Physical Activity: Inactive (07/20/2021)   Exercise Vital Sign    Days of Exercise per Week: 0 days    Minutes of Exercise per Session: 0 min  Stress: No Stress Concern Present (07/20/2021)   [place]    Feeling of Stress : Only a little  Social Connections: Moderately Isolated (07/20/2021)   Social Connection and Isolation Panel [NHANES]    Frequency of Communication with Friends and Family: Three times a week    Frequency of Social Gatherings with Friends and Family: More than three times a week    Attends Religious Services: Never    Marine scientist or Organizations: Yes    Attends Music therapist: More than 4 times per year    Marital Status: Widowed     Family History: The patient's family history includes Alcohol abuse in her father; Arthritis in her mother; Diabetes in her maternal grandmother; Heart attack in her father; Heart disease in her father and paternal grandfather; Hyperlipidemia in her father; Hypertension in her father; Kidney disease in her father; Kidney failure in her mother.  ROS:   Please see the history of present illness.    All other systems reviewed and are negative.  Labs/Other Studies Reviewed:    The following studies were reviewed today:  Cardiac Monitor  07/06/21  The dominant rhythm is mild sinus bradycardia with markedly blunted circadian variation There are very rare episodes of brief nonsustained atrial tachycardia (4-7 beats) and rare PACs. There is no atrial fibrillation. There is no evidence of significant ventricular arrhythmia. There are no episodes of severe bradycardia or pauses.   Abnormal arrhythmia monitor to the presence of mild sinus bradycardia markedly blunted chronotropic response.  At least in part this is due to treatment with beta-blockers Palpitations are probably due to PACs and rare brief  episodes of paroxysmal atrial tachycardia.    Carotid Duplex 05/31/2021  Right Carotid: Velocities in the right ICA are consistent with a 1-39%  stenosis.   Left Carotid: Velocities in the left ICA are consistent with a 1-39%  stenosis.   Vertebrals: Bilateral vertebral arteries demonstrate antegrade flow.  Subclavians: Normal flow hemodynamics were seen in the right subclavian  artery.              Patent left subclavian artery bypass graft.   *See table(s) above for measurements and observations.      Lexiscan Myoview 01/13/2019: The left ventricular ejection fraction is hyperdynamic (>65%). Nuclear stress EF: 71%. There was no ST segment deviation noted during stress. T wave inversion was noted during stress in the II, III, aVF, V3, V4, V5 and V6 leads, beginning at 30 seconds of stress, and returning to baseline after less than 1 min of recovery. This is a low risk study.   No ischemia or infarction noted.  There is significant tracer uptake in the bowel which may impact the interpretation of the inferior wall which appears to have normal perfusion. Wall motion is normal, therefore perfusion is likely normal.    Consider performing future studies on the D-Spect (supine and upright) camera.      Echo [facility] 09/13/2020 Left Ventricle: Systolic function is normal. EF: 60-65%.    Left Atrium: Injection of  agitated saline documents no interatrial  shunt. .  Left Ventricle  Normal left ventricular size. Wall thickness is normal. Systolic function is normal. EF: 60-65%. Wall motion is normal. Doppler parameters indicate normal diastolic function.   Right Ventricle  Right ventricle is normal. Systolic function is normal.   Left Atrium  Left atrium is normal in size. Injection of agitated saline documents no interatrial shunt.   Right Atrium  Normal sized right atrium.   IVC/SVC  The inferior vena cava demonstrates a diameter of <=2.1 cm and collapses >50%; therefore, the right atrial pressure is estimated at 3 mmHg.   Mitral Valve  Mitral valve structure is normal. There is trace regurgitation.   Tricuspid Valve  Tricuspid valve structure is normal. There is trace regurgitation. Unable to assess RVSP due to incomplete Doppler signal.   Aortic Valve  The aortic valve is tricuspid. The leaflets are not thickened and exhibit normal excursion. There is no regurgitation or stenosis.   Pulmonic Valve  The pulmonic valve was not well visualized. Trace regurgitation.   Ascending Aorta  The aortic root is normal in size.   Pericardium  There is no pericardial effusion.   Study Details  A complete echo was performed using complete 2D, color flow Doppler and spectral Doppler. During the study the apical, parasternal and subcostal view was captured. Saline (bubble) contrast was injected during the study. Overall the study quality was fair.    Recent Labs: 04/24/2022: ALT 14; BUN 12; Creat 0.88; Hemoglobin 11.8; Platelets 350; Potassium 4.7; Sodium 135  Recent Lipid Panel    Component Value Date/Time   CHOL 123 08/28/2021 0000   CHOL 129 02/17/2019 1234   TRIG 164 (H) 08/28/2021 0000   HDL 46 (L) 08/28/2021 0000   HDL 47 02/17/2019 1234   CHOLHDL 2.7 08/28/2021 0000   VLDL 26 02/07/2016 0822   LDLCALC 53 08/28/2021 0000     Risk Assessment/Calculations:       Physical Exam:     VS:  BP (!) 145/70   Pulse 62   Ht '5\' 4"'$  (1.626 m)  Wt 151 lb 6.4 oz (68.7 kg)   SpO2 98%   BMI 25.99 kg/m     Wt Readings from Last 3 Encounters:  05/02/22 151 lb 6.4 oz (68.7 kg)  04/24/22 148 lb 8 oz (67.4 kg)  02/28/22 147 lb (66.7 kg)     GEN:  Well nourished, well developed in no acute distress HEENT: Normal NECK: No JVD; No carotid bruits CARDIAC: RRR, no murmurs, rubs, gallops RESPIRATORY:  Clear to auscultation without rales, wheezing or rhonchi  ABDOMEN: Soft, non-tender, non-distended MUSCULOSKELETAL:  No edema; No deformity. 2+ pedal pulses, equal bilaterally SKIN: Warm and dry NEUROLOGIC:  Alert and oriented x 3 PSYCHIATRIC:  Normal affect   EKG:  EKG is not ordered today.    HYPERTENSION CONTROL Vitals:   05/02/22 1438 05/02/22 1459  BP: (!) 148/78 (!) 145/70    The patient's blood pressure is elevated above target today.  In order to address the patient's elevated BP: Blood pressure will be monitored at home to determine if medication changes need to be made.      Diagnoses:    1. Nonspecific abnormal electrocardiogram (ECG) (EKG)   2. Coronary artery disease involving native coronary artery of native heart without angina pectoris   3. Essential hypertension   4. Palpitations    Assessment and Plan:     Palpitations: Recent palpitations that occur at night and gradually subside.  These are worse when she is lying on left or right side.  Feels they are 2/2 recent COVID booster. discussed placing cardiac monitor, however she would like to hold off for now. Advised her to contact us if palpitations worsen.   Hypertension: BP is elevated in the office today and remains so on my recheck.  She reports well-controlled BP at home.  Advised her to notify us if she is consistently getting BP > 140 or > 80.   Abnormal EKG/CAD without angina: Moderate CAD by coronary angiography 2009. Concern from PCP for EKG done 04/24/22 for new Q waves in leads V1-V3.   Patient has been asymptomatic; no chest pain, dyspnea, palpitations, or activity intolerance.  We will get echocardiogram to evaluate for wall motion abnormality.  Abdominal pain/Mesenteric artery stenosis: She is having pain and bloating in the area of prior mesenteric artery repair.  Encouraged her to follow-up with Dr. Scot Dock as she is due to see him soon.      Disposition: 4 months with Dr. Sallyanne Kuster  Medication Adjustments/Labs and Tests Ordered: Current medicines are reviewed at length with the patient today.  Concerns regarding medicines are outlined above.  Orders Placed This Encounter  Procedures   ECHOCARDIOGRAM COMPLETE   No orders of the defined types were placed in this encounter.   Patient Instructions  Medication Instructions:   Your physician recommends that you continue on your current medications as directed. Please refer to the Current Medication list given to you today.   *If you need a refill on your cardiac medications before your next appointment, please call your pharmacy*   Lab Work:  None ordered.  If you have labs (blood work) drawn today and your tests are completely normal, you will receive your results only by: [place] (if you have MyChart) OR A paper copy in the mail If you have any lab test that is abnormal or we need to change your treatment, we will call you to review the results.   Testing/Procedures:   Your physician has requested that you have an echocardiogram. Echocardiography is  a painless test that uses sound waves to create images of your heart. It provides your doctor with information about the size and shape of your heart and how well your heart's chambers and valves are working. This procedure takes approximately one hour. There are no restrictions for this procedure. Please do NOT wear cologne, perfume, or lotions (deodorant is allowed). Please arrive 15 minutes prior to your appointment time.    Follow-Up: At [facility], you and your health needs are our priority.  As part of our continuing mission to provide you with exceptional heart care, we have created designated Provider Care Teams.  These Care Teams include your primary Cardiologist (physician) and Advanced Practice Providers (APPs -  Physician Assistants and Nurse Practitioners) who all work together to provide you with the care you need, when you need it.  We recommend signing up for the patient portal called "MyChart".  Sign up information is provided on this After Visit Summary.  MyChart is used to connect with patients for Virtual Visits (Telemedicine).  Patients are able to view lab/test results, encounter notes, upcoming appointments, etc.  Non-urgent messages can be sent to your provider as well.   To learn more about what you can do with MyChart, go to [email or web address].    Your next appointment:   4 month(s)  The format for your next appointment:   In Person  Provider:   Sanda Klein, MD       Important Information About Sugar         Signed, Emmaline Life, NP  05/02/2022 4:41 PM    [place]

## 2022-05-02 NOTE — Progress Notes (Signed)
Suspect it is a lead placement issue. Thank you.

## 2022-05-02 NOTE — Patient Instructions (Signed)
Medication Instructions:   Your physician recommends that you continue on your current medications as directed. Please refer to the Current Medication list given to you today.   *If you need a refill on your cardiac medications before your next appointment, please call your pharmacy*   Lab Work:  None ordered.  If you have labs (blood work) drawn today and your tests are completely normal, you will receive your results only by: [place] (if you have MyChart) OR A paper copy in the mail If you have any lab test that is abnormal or we need to change your treatment, we will call you to review the results.   Testing/Procedures:   Your physician has requested that you have an echocardiogram. Echocardiography is a painless test that uses sound waves to create images of your heart. It provides your doctor with information about the size and shape of your heart and how well your heart's chambers and valves are working. This procedure takes approximately one hour. There are no restrictions for this procedure. Please do NOT wear cologne, perfume, or lotions (deodorant is allowed). Please arrive 15 minutes prior to your appointment time.    Follow-Up: At [facility], you and your health needs are our priority.  As part of our continuing mission to provide you with exceptional heart care, we have created designated Provider Care Teams.  These Care Teams include your primary Cardiologist (physician) and Advanced Practice Providers (APPs -  Physician Assistants and Nurse Practitioners) who all work together to provide you with the care you need, when you need it.  We recommend signing up for the patient portal called "MyChart".  Sign up information is provided on this After Visit Summary.  MyChart is used to connect with patients for Virtual Visits (Telemedicine).  Patients are able to view lab/test results, encounter notes, upcoming appointments, etc.  Non-urgent messages can be  sent to your provider as well.   To learn more about what you can do with MyChart, go to [email or web address].    Your next appointment:   4 month(s)  The format for your next appointment:   In Person  Provider:   Sanda Klein, MD       Important Information About Sugar

## 2022-05-06 ENCOUNTER — Ambulatory Visit: Payer: Medicare Other | Admitting: Nurse Practitioner

## 2022-05-07 ENCOUNTER — Ambulatory Visit: Payer: Medicare Other | Attending: Nurse Practitioner

## 2022-05-07 ENCOUNTER — Other Ambulatory Visit: Payer: Self-pay | Admitting: *Deleted

## 2022-05-07 DIAGNOSIS — R002 Palpitations: Secondary | ICD-10-CM

## 2022-05-07 NOTE — Progress Notes (Unsigned)
Enrolled patient for a 14 day Zio XT monitor to be mailed to patiens home

## 2022-05-08 ENCOUNTER — Ambulatory Visit (INDEPENDENT_AMBULATORY_CARE_PROVIDER_SITE_OTHER): Payer: Medicare Other

## 2022-05-08 DIAGNOSIS — M810 Age-related osteoporosis without current pathological fracture: Secondary | ICD-10-CM

## 2022-05-08 NOTE — Progress Notes (Signed)
Hi Traci [name], your bone density test shows a T score of -2.6 which is in the category of osteoporosis.  There has been a slight decrease compared to 2021.  I definitely want you taking extra vitamin D since your vitamin D level was low a couple weeks ago and also you would benefit from a bisphosphonate which is a bone builder such as Fosamax or Boniva these are taken once a month.  I would encourage you to think about starting the medication.  If you are going to have any dental work done soon then I would discuss first with your dentist so that we can start the medication when appropriate.

## 2022-05-13 ENCOUNTER — Ambulatory Visit: Payer: Self-pay | Admitting: Licensed Clinical Social Worker

## 2022-05-13 ENCOUNTER — Encounter: Payer: Self-pay | Admitting: Family Medicine

## 2022-05-13 NOTE — Patient Instructions (Signed)
Visit Information  Thank you for taking time to visit with me today. Please don't hesitate to contact me if I can be of assistance to you before our next scheduled telephone appointment.  Following are the goals we discussed today:   No further follow up needed at present. No intervention needed at present  Please call the care guide team at [phone] if you need to cancel or reschedule your appointment.   If you are experiencing a Mental Health or [place] or need someone to talk to, please go to [facility] [phone])   Following is a copy of your full plan of care:   Intervention Called client today and spoke with her about program support. Described support in program with LCSW, RN and Pharmacist. Encouraged client to talk with PCP about program support  Client will think about program support.  She will talk with PCP if she is interested in participation in program. She did not request return call at present  Ms. [place] was given information about Care Management services by the embedded care coordination team including:  Care Management services include personalized support from designated clinical staff supervised by her physician, including individualized plan of care and coordination with other care providers 24/7 contact phone numbers for assistance for urgent and routine care needs. The patient may stop CCM services at any time (effective at the end of the month) by phone call to the office staff.  Patient agreed to services and verbal consent obtained.   Norva Riffle.Ginnie Marich MSW, [place] representative [facility] Care Management [phone]

## 2022-05-13 NOTE — Patient Outreach (Signed)
  Care Coordination   Initial Visit Note   05/13/2022 Name: Traci [name] MRN: [id number] DOB: [birth year]  Traci [name] is a 76 y.o. year old female who sees Metheney, Rene Kocher, MD for primary care. I spoke with  Traci [name] by phone today.  What matters to the patients health and wellness today?  Client not sure if she wants to participate in program support at this time   Goals Addressed             This Visit's Progress    patient appreciative of call from LCSW. She will think about program support . Not sure if she wants to participate in program at present       Intervention Called client today and spoke with her about program support. Described support in program with LCSW, RN and Pharmacist. Encouraged client to talk with PCP about program support  Client will think about program support.  She will talk with PCP if she is interested in participation in program. She did not request return call at present     SDOH assessments and interventions completed:  Yes    Care Coordination Interventions:  No, not indicated   Follow up plan: No further intervention required.   Encounter Outcome:  Pt. Visit Completed   Norva Riffle.Tallyn Holroyd MSW, [place] [employer] representative [facility] Care Management [phone]

## 2022-05-16 ENCOUNTER — Ambulatory Visit (HOSPITAL_COMMUNITY): Payer: Medicare Other | Attending: Internal Medicine

## 2022-05-16 DIAGNOSIS — R9431 Abnormal electrocardiogram [ECG] [EKG]: Secondary | ICD-10-CM | POA: Diagnosis not present

## 2022-05-16 DIAGNOSIS — R002 Palpitations: Secondary | ICD-10-CM

## 2022-05-16 LAB — ECHOCARDIOGRAM COMPLETE
Area-P 1/2: 3.34 cm2
S' Lateral: 2.4 cm

## 2022-05-21 ENCOUNTER — Encounter: Payer: Self-pay | Admitting: Sports Medicine

## 2022-05-22 MED ORDER — CANDESARTAN CILEXETIL 4 MG PO TABS: 4.0000 mg | ORAL_TABLET | Freq: Two times a day (BID) | ORAL | 3 refills | Status: DC

## 2022-05-29 DIAGNOSIS — K5904 Chronic idiopathic constipation: Secondary | ICD-10-CM | POA: Diagnosis not present

## 2022-05-29 DIAGNOSIS — R6881 Early satiety: Secondary | ICD-10-CM | POA: Diagnosis not present

## 2022-05-29 DIAGNOSIS — R1033 Periumbilical pain: Secondary | ICD-10-CM | POA: Diagnosis not present

## 2022-05-29 DIAGNOSIS — K219 Gastro-esophageal reflux disease without esophagitis: Secondary | ICD-10-CM | POA: Diagnosis not present

## 2022-05-31 ENCOUNTER — Other Ambulatory Visit (HOSPITAL_COMMUNITY): Payer: Medicare Other

## 2022-05-31 ENCOUNTER — Encounter (HOSPITAL_COMMUNITY): Payer: Self-pay

## 2022-06-05 DIAGNOSIS — R002 Palpitations: Secondary | ICD-10-CM | POA: Diagnosis not present

## 2022-06-19 ENCOUNTER — Other Ambulatory Visit: Payer: Self-pay | Admitting: Family Medicine

## 2022-06-20 NOTE — Telephone Encounter (Signed)
Last written 04/16/2022 #85 with no refills Last appt 04/24/2022

## 2022-06-21 ENCOUNTER — Ambulatory Visit (INDEPENDENT_AMBULATORY_CARE_PROVIDER_SITE_OTHER): Payer: Medicare Other | Admitting: Sports Medicine

## 2022-06-21 DIAGNOSIS — M5030 Other cervical disc degeneration, unspecified cervical region: Secondary | ICD-10-CM | POA: Diagnosis not present

## 2022-06-21 MED ORDER — PREDNISONE 20 MG PO TABS: ORAL_TABLET | ORAL | 0 refills | Status: DC

## 2022-06-21 NOTE — Assessment & Plan Note (Signed)
Pleasant 77 year old female, known history of cervical DDD, she has an MRI from 2022, she has been seeing the docs at [facility]. She is now having pain right periscapular region radiating around to the axilla as well as upper shoulder and upper arm. MRI does show a C6-C7 disc protrusion albeit very small. We will treat her conservatively, 5 days of prednisone, she requests 20 mg instead of 50, she can follow this up with meloxicam as needed, home physical therapy given, she is relatively hesitant to do an epidural if this fails, so we can try Lyrica instead, she did not respond well to gabapentin. If we do need to do an epidural it would need to be with triamcinolone rather than Depo-Medrol. She suspects that Depo-Medrol created an allergic reaction in the past prior to a cardiac catheterization and refuses to take it.

## 2022-06-21 NOTE — Progress Notes (Signed)
    Procedures performed today:    None.  Independent interpretation of notes and tests performed by another provider:   None.  Brief History, Exam, Impression, and Recommendations:    DDD (degenerative disc disease), cervical Pleasant 77 year old female, known history of cervical DDD, she has an MRI from 2022, she has been seeing the docs at [facility]. She is now having pain right periscapular region radiating around to the axilla as well as upper shoulder and upper arm. MRI does show a C6-C7 disc protrusion albeit very small. We will treat her conservatively, 5 days of prednisone, she requests 20 mg instead of 50, she can follow this up with meloxicam as needed, home physical therapy given, she is relatively hesitant to do an epidural if this fails, so we can try Lyrica instead, she did not respond well to gabapentin. If we do need to do an epidural it would need to be with triamcinolone rather than Depo-Medrol. She suspects that Depo-Medrol created an allergic reaction in the past prior to a cardiac catheterization and refuses to take it.    ____________________________________________ Gwen Her. Dianah Field, M.D., ABFM., CAQSM., AME. Primary Care and Sports Medicine [facility]  Adjunct Professor of [employer]  Risk manager

## 2022-06-25 ENCOUNTER — Encounter: Payer: Self-pay | Admitting: Sports Medicine

## 2022-07-01 ENCOUNTER — Other Ambulatory Visit: Payer: Self-pay | Admitting: Cardiovascular Disease

## 2022-07-02 ENCOUNTER — Encounter: Payer: Self-pay | Admitting: Family Medicine

## 2022-07-11 DIAGNOSIS — M5412 Radiculopathy, cervical region: Secondary | ICD-10-CM | POA: Diagnosis not present

## 2022-07-12 ENCOUNTER — Other Ambulatory Visit: Payer: Self-pay | Admitting: *Deleted

## 2022-07-12 MED ORDER — CLORAZEPATE DIPOTASSIUM 7.5 MG PO TABS: ORAL_TABLET | ORAL | 0 refills | Status: DC

## 2022-07-16 NOTE — Therapy (Unsigned)
OUTPATIENT PHYSICAL THERAPY CERVICAL EVALUATION   Patient Name: Traci [name] MRN: [id number] DOB:04-01-1946, 77 y.o., female Today's Date: 07/17/2022  END OF SESSION:  PT End of Session - 07/17/22 1351     Visit Number 1    Number of Visits 16    Date for PT Re-Evaluation 09/11/22    PT Start Time 7829    PT Stop Time 1440    PT Time Calculation (min) 49 min    Activity Tolerance Patient tolerated treatment well             Past Medical History:  Diagnosis Date   Allergy 1995   Contrast dye   Anxiety    Atrial fibrillation ([place])    CAD (coronary artery disease)    Claudication ([place]) 01/06/2008   Lower extremity dopplers - no evidence of arterial insufficiency, normal exam   Coronary artery disease    GERD (gastroesophageal reflux disease)    Heart murmur    Hyperlipidemia    Hypertension 11/30/2010   echo- EF 55%; normal w/ mildly sclerotic aortic valve   Hypertension 11/05/2011   renal dopplers - celiac artery and SMA >50% diameter reduction, R renal artery - mildly elevated velocities 1-59% diameter reduction, L renal artery normal   Nonspecific ST-T wave electrocardiographic changes 03/26/2011   R/Lmv - EF 74%, normal perfusion all regions, ST depression w/ Lexiscan infusion w/o assoc angina   Osteopenia    PAD (peripheral artery disease) (HCC)    Peripheral vascular disease (HCC)    PVD (peripheral vascular disease) ([place]) 11/05/2011   doppler - R/L brachial pressures essentially equal w/o inflow disease; L sublclavian/CCA bypass graft demonstrates patent flow, no evidence of significant stenosis   Sigmoid diverticulitis    Past Surgical History:  Procedure Laterality Date   ABDOMINAL AORTIC ANEURYSM REPAIR N/A 01/27/2014   Procedure: AORTO-SUPERIOR MESENTERIC ARTERY BYPASS GRAFT;  Surgeon: Angelia Mould, MD;  Location: [facility];  Service: Vascular;  Laterality: N/A;   ABDOMINAL HYSTERECTOMY     APPENDECTOMY     CARDIAC CATHETERIZATION   06/03/2008   60% LAD, involving D2, borderline significant by IVUS, medical therapy. CFX, RCA OK.   CATARACT EXTRACTION     CHOLECYSTECTOMY     EYE SURGERY     retinal surgery   SMALL INTESTINE SURGERY     SPINE SURGERY  07/2009   SUBCLAVIAN ARTERY STENT  1995   X's  several   SUBCLAVIAN ARTERY STENT Left 09/20/2008   LSA ISR 7x95m Cordis Genesis on Opta premount, reduction from 80% to 0%   subclavian artery stents  01/23/2010   Left carotid to subclavian artery Bypass   VISCERAL ANGIOGRAM  01/17/2014   Procedure: VISCERAL ANGIOGRAM;  Surgeon: CAngelia Mould MD;  [facility];  Service: Cardiovascular;;   Patient Active Problem List   Diagnosis Date Noted   DDD (degenerative disc disease), cervical 06/21/2022   PAT (paroxysmal atrial tachycardia) 03/01/2022   Bilateral knee pain 01/02/2022   Sprain of anterior talofibular ligament of left ankle 09/28/2020   Edema 09/03/2019   Epigastric pain 08/11/2019   Insomnia 08/10/2019   Hypocalcemia 06/03/2019   Atherosclerosis of aorta ([place] 01/04/2018   Osteoporosis 12/25/2017   CKD (chronic kidney disease) stage 3, GFR 30-59 ml/min (HCC) 08/04/2017   Primary osteoarthritis of first carpometacarpal joint of left hand 07/10/2017   Strain of right Achilles tendon 04/14/2017   Atypical nevus 08/22/2016   Acute bilateral low back pain with left-sided sciatica 06/06/2016  Menopause 12/08/2015   History of colonoscopy 07/17/2015   Irregular heartbeat 07/26/2014   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   Irritable bowel syndrome 09/21/2013   Hyperlipidemia 04/05/2013   Hypertension 12/06/2012   Peripheral arterial disease ([place]) 12/06/2012   CAD, cath 2009 12/06/2012   Hyponatremia, improved holding diuretic 12/06/2012   Lymphocele of left arm 07/14/2012   Atherosclerosis of other specified arteries 04/02/2012   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    Stricture of artery ([place]) 10/02/2011   Allergic dermatitis 06/09/2011    PCP: Dr Hali Marry   REFERRING PROVIDER: Dr Aundria Mems  REFERRING DIAG: Cervical DDD  THERAPY DIAG:  Cervicalgia  Abnormal posture  Muscle weakness (generalized)  Other symptoms and signs involving the musculoskeletal system  Rationale for Evaluation and Treatment: Rehabilitation  ONSET DATE: 05/31/22  SUBJECTIVE:                                                                                                                                                                                                         SUBJECTIVE STATEMENT: Patient reports that she has had neck and shoulder pain over the past several weeks which has progressively worsened in the past few weeks. She has tried some exercises at home with no significant change. She was seen by neurosurgeon and treated with medication.   PERTINENT HISTORY:  DDD cervical spine with surgery C6; lumbar DDD with fusion 2011; bilat hip/LE pain; cardiac problems Abdominal aortic bypass; Lt subclavian bypass; arthritis; osteoporosis      PAIN:  Are you having pain? Yes: NPRS scale: 4/10 Pain location: Rt neck into Rt shoulder and upper arm Pain description: nagging  Aggravating factors: lying on Rt side; lifting; computer; reading  Relieving factors: topical analgesics; heat   PRECAUTIONS: Lt subclavian bybass; AAA bypass   WEIGHT BEARING RESTRICTIONS: No  FALLS:  Has patient fallen in last 6 months? No  LIVING ENVIRONMENT: Lives with: lives alone Lives in: House/apartment   OCCUPATION: retired; active with household chores and dancing; reading; some computer work; Rt hand dominate    PLOF: Independent  PATIENT GOALS: get rid of pain and use the arm again; dance again   NEXT MD VISIT: 08/22/22  OBJECTIVE:   DIAGNOSTIC FINDINGS:  MRI 03/04/21 - Mild cervical degenerative disc disease without spinal canal or neural  foraminal stenosis.  PATIENT SURVEYS:  FOTO 49 goal 60  COGNITION: Overall cognitive status: Within functional limits for tasks assessed  SENSATION: Rt UE numbness and tingling glove like intermittently daily  lasting ~ 1-2 minutes   POSTURE: Patient presents with head forward posture with increased thoracic kyphosis; shoulders rounded and elevated; scapulae abducted and rotated along the thoracic spine; head of the humerus anterior in orientation.   PALPATION: Muscular tightness ant/lat/post cervical musculature; pecs; upper trap; leveator; biceps Rt > Lt   CERVICAL ROM:   Active ROM A/PROM (deg) eval  Flexion 39  Extension 24  Right lateral flexion 29  Left lateral flexion 34  Right rotation 54  Left rotation 53   (Blank rows = not tested)  UPPER EXTREMITY ROM:  Active ROM Right eval Left eval  Shoulder flexion 145 150  Shoulder extension    Shoulder abduction 145 150  Shoulder adduction    Shoulder extension    Shoulder internal rotation Thumb T12 Thumb T7  Shoulder external rotation 75 80  Elbow flexion    Elbow extension    Wrist flexion    Wrist extension    Wrist ulnar deviation    Wrist radial deviation    Wrist pronation    Wrist supination     (Blank rows = not tested)  UPPER EXTREMITY MMT:  MMT Right eval Left eval  Shoulder flexion    Shoulder extension    Shoulder abduction    Shoulder adduction    Shoulder extension    Shoulder internal rotation    Shoulder external rotation    Middle trapezius 4 4  Lower trapezius    Elbow flexion    Elbow extension    Wrist flexion    Wrist extension    Wrist ulnar deviation    Wrist radial deviation    Wrist pronation    Wrist supination    Grip strength 18 45   (Blank rows = not tested)   TODAY'S TREATMENT:                                                                                                                              [facility] Adult PT Treatment:                                                 DATE: 07/17/22 Therapeutic Exercise: Supine  Deep breathing x ~ 2 min  Chin tuck 5 sec x 10  Scap squeeze 5 sec x 10  T UE's out to side ~ 1 min bending elbows to release stretch as needed  Sitting   Chin tuck 5 sec x 5  Scap squeeze 5 sec x 5  Standing   Chin tuck 5 sec x 5   Scap squeeze 5 sec x 5   ER elbows at 90 degrees 's with noodle x 3 sec x 10  Manual Therapy:  Neuromuscular re-ed: Postural correction and education engaging posterior shoulder girdle musculature Therapeutic Activity: Diaphragmatic breathing count 6 inhale, hold,  exhale  Modalities: MH cervical and thoracic spine x 10 min  Self Care: Sitting with noodle; supporting UE's on pillows for reading   PATIENT EDUCATION:  Education details: POC; HEP  Person educated: Patient Education method: Explanation, Demonstration, Tactile cues, Verbal cues, and Handouts Education comprehension: verbalized understanding, returned demonstration, verbal cues required, tactile cues required, and needs further education  HOME EXERCISE PROGRAM: Access Code: [email or web address] URL: [email or web address] Date: 07/17/2022 Prepared by: Gillermo Murdoch  Exercises - Supine Diaphragmatic Breathing  - 2 x daily - 7 x weekly - 1 sets - 10 reps - 4-6 sec  hold - Supine Cervical Retraction with Towel  - 2 x daily - 7 x weekly - 1 sets - 5-10 reps - 10 sec  hold - Supine Scapular Retraction  - 2 x daily - 7 x weekly - 1 sets - 10 reps - 5-10 sec  hold - Supine Thoracic Mobilization Towel Roll Vertical with Arm Stretch  - 2 x daily - 7 x weekly - 1 sets - 1-3 reps - 2-3 min hold - Seated Cervical Retraction  - 3 x daily - 7 x weekly - 1 sets - 10 reps - Standing Scapular Retraction  - 3 x daily - 7 x weekly - 1 sets - 10 reps - 10 hold - Shoulder External Rotation and Scapular Retraction  - 3 x daily - 7 x weekly - 1 sets - 10 reps -   hold  Patient Education - Office Posture  ASSESSMENT:  CLINICAL IMPRESSION: Patient is  a 77 y.o. female who was seen today for physical therapy evaluation and treatment for cervical dysfunction with Rt > Lt UE radiculopathy. She has had symptoms in the past 6+ weeks including pain and discomfort with radicular pain in the neck and Rt shoulder; poor posture and alignment; limited cervical and bilat shoulder ROM; Rt UE weakness; limited functional activity level. She will benefit from PT to address problems identified.   OBJECTIVE IMPAIRMENTS: decreased activity tolerance, decreased mobility, decreased ROM, decreased strength, hypomobility, increased fascial restrictions, impaired flexibility, impaired UE functional use, improper body mechanics, postural dysfunction, and pain.   ACTIVITY LIMITATIONS: carrying, lifting, and sleeping  PARTICIPATION LIMITATIONS: meal prep, cleaning, laundry, and dancing  PERSONAL FACTORS: Age, Fitness, Past/current experiences, Time since onset of injury/illness/exacerbation, and  comorbidities: cervical DDD; subclavian bypass; AA bypass;  are also affecting patient's functional outcome.   REHAB POTENTIAL: Good  CLINICAL DECISION MAKING: Stable/uncomplicated  EVALUATION COMPLEXITY: Low   GOALS: Goals reviewed with patient? Yes  SHORT TERM GOALS: Target date: 08/13/2022  Improve posture and alignment with patient to demonstrate increased upright posture with posterior shoulder girdle engaged Baseline:  Goal status: INITIAL  2.  Independent in initial HEP  Baseline:  Goal status: INITIAL  3.  Patient to demonstrate and report improved positions for sitting activities  Baseline:  Goal status: INITIAL  LONG TERM GOALS: Target date: 09/11/2022   Decrease pain in neck and shoulder by 75-100% allowing patient to return to more normal functional activities Baseline:  Goal status: INITIAL  2.  Increase AROM cervical spine to WFL's with minimal to no pain  Baseline:  Goal status: INITIAL  3.  Increase strength Rt UE grip strength to 40-45#   Baseline:  Goal status: INITIAL  4.  Improve posture and alignment to increase posterior shoulder girdle strength with posterior shoulder girdle musculature engaged  Baseline:  Goal status: INITIAL  5.  Independent in HEP including aquatic program as indicated  Baseline:  Goal status: INITIAL  6.  Improve functional limitation score to 60 Baseline:  Goal status: INITIAL   PLAN:  PT FREQUENCY: 2x/wk  PT DURATION: 8 weeks  PLANNED INTERVENTIONS: Therapeutic exercises, Therapeutic activity, Neuromuscular re-education, Patient/Family education, Self Care, Joint mobilization, Aquatic Therapy, Dry Needling, Spinal mobilization, Cryotherapy, Moist heat, Taping, Traction, Ultrasound, Ionotophoresis '4mg'$ /ml Dexamethasone, Manual therapy, and Re-evaluation  PLAN FOR NEXT SESSION: review and progress exercise; postural correction and education; manual work, DN, modalities as indicated    [employer], PT 07/17/2022, 1:52 PM

## 2022-07-17 ENCOUNTER — Other Ambulatory Visit: Payer: Self-pay

## 2022-07-17 ENCOUNTER — Encounter: Payer: Self-pay | Admitting: Rehabilitative and Restorative Service Providers"

## 2022-07-17 ENCOUNTER — Ambulatory Visit: Payer: Medicare Other | Attending: Neurological Surgery | Admitting: Rehabilitative and Restorative Service Providers"

## 2022-07-17 DIAGNOSIS — M6281 Muscle weakness (generalized): Secondary | ICD-10-CM | POA: Diagnosis not present

## 2022-07-17 DIAGNOSIS — M542 Cervicalgia: Secondary | ICD-10-CM | POA: Diagnosis not present

## 2022-07-17 DIAGNOSIS — R293 Abnormal posture: Secondary | ICD-10-CM | POA: Insufficient documentation

## 2022-07-17 DIAGNOSIS — R29898 Other symptoms and signs involving the musculoskeletal system: Secondary | ICD-10-CM | POA: Diagnosis not present

## 2022-07-23 ENCOUNTER — Ambulatory Visit: Payer: Medicare Other | Admitting: Rehabilitative and Restorative Service Providers"

## 2022-07-25 ENCOUNTER — Encounter: Payer: Self-pay | Admitting: Rehabilitative and Restorative Service Providers"

## 2022-07-25 ENCOUNTER — Ambulatory Visit: Payer: Medicare Other | Attending: Neurological Surgery | Admitting: Rehabilitative and Restorative Service Providers"

## 2022-07-25 DIAGNOSIS — R293 Abnormal posture: Secondary | ICD-10-CM | POA: Insufficient documentation

## 2022-07-25 DIAGNOSIS — M79605 Pain in left leg: Secondary | ICD-10-CM | POA: Diagnosis not present

## 2022-07-25 DIAGNOSIS — M542 Cervicalgia: Secondary | ICD-10-CM | POA: Insufficient documentation

## 2022-07-25 DIAGNOSIS — R29898 Other symptoms and signs involving the musculoskeletal system: Secondary | ICD-10-CM | POA: Insufficient documentation

## 2022-07-25 DIAGNOSIS — M79604 Pain in right leg: Secondary | ICD-10-CM | POA: Diagnosis not present

## 2022-07-25 DIAGNOSIS — M6281 Muscle weakness (generalized): Secondary | ICD-10-CM | POA: Diagnosis not present

## 2022-07-25 NOTE — Therapy (Signed)
OUTPATIENT PHYSICAL THERAPY CERVICAL TREATMENT   Patient Name: Traci [name] MRN: [id number] DOB:1945-08-05, 77 y.o., female Today's Date: 07/25/2022  END OF SESSION:  PT End of Session - 07/25/22 1356     Visit Number 2    Number of Visits 16    Date for PT Re-Evaluation 09/11/22    PT Start Time 6440    PT Stop Time 3474    PT Time Calculation (min) 48 min    Activity Tolerance Patient tolerated treatment well             Past Medical History:  Diagnosis Date   Allergy 1995   Contrast dye   Anxiety    Atrial fibrillation (HCC)    CAD (coronary artery disease)    CKD (chronic kidney disease) stage 3, GFR 30-59 ml/min ([place]) 08/04/2017   Claudication ([place]) 01/06/2008   Lower extremity dopplers - no evidence of arterial insufficiency, normal exam   Coronary artery disease    GERD (gastroesophageal reflux disease)    Heart murmur    Hyperlipidemia    Hypertension 11/30/2010   echo- EF 55%; normal w/ mildly sclerotic aortic valve   Hypertension 11/05/2011   renal dopplers - celiac artery and SMA >50% diameter reduction, R renal artery - mildly elevated velocities 1-59% diameter reduction, L renal artery normal   Nonspecific ST-T wave electrocardiographic changes 03/26/2011   R/Lmv - EF 74%, normal perfusion all regions, ST depression w/ Lexiscan infusion w/o assoc angina   Osteopenia    PAD (peripheral artery disease) (HCC)    Peripheral vascular disease (HCC)    PVD (peripheral vascular disease) ([place]) 11/05/2011   doppler - R/L brachial pressures essentially equal w/o inflow disease; L sublclavian/CCA bypass graft demonstrates patent flow, no evidence of significant stenosis   Sigmoid diverticulitis    Past Surgical History:  Procedure Laterality Date   ABDOMINAL AORTIC ANEURYSM REPAIR N/A 01/27/2014   Procedure: AORTO-SUPERIOR MESENTERIC ARTERY BYPASS GRAFT;  Surgeon: Angelia Mould, MD;  Location: [facility];  Service: Vascular;  Laterality: N/A;    ABDOMINAL HYSTERECTOMY     APPENDECTOMY     CARDIAC CATHETERIZATION  06/03/2008   60% LAD, involving D2, borderline significant by IVUS, medical therapy. CFX, RCA OK.   CATARACT EXTRACTION     CHOLECYSTECTOMY     EYE SURGERY     retinal surgery   SMALL INTESTINE SURGERY     SPINE SURGERY  07/2009   SUBCLAVIAN ARTERY STENT  1995   X's  several   SUBCLAVIAN ARTERY STENT Left 09/20/2008   LSA ISR 7x6m Cordis Genesis on Opta premount, reduction from 80% to 0%   subclavian artery stents  01/23/2010   Left carotid to subclavian artery Bypass   VISCERAL ANGIOGRAM  01/17/2014   Procedure: VISCERAL ANGIOGRAM;  Surgeon: CAngelia Mould MD;  [facility];  Service: Cardiovascular;;   Patient Active Problem List   Diagnosis Date Noted   DDD (degenerative disc disease), cervical 06/21/2022   PAT (paroxysmal atrial tachycardia) 03/01/2022   Bilateral knee pain 01/02/2022   Sprain of anterior talofibular ligament of left ankle 09/28/2020   Edema 09/03/2019   Epigastric pain 08/11/2019   Insomnia 08/10/2019   Hypocalcemia 06/03/2019   Atherosclerosis of aorta ([place] 01/04/2018   Osteoporosis 12/25/2017   CKD (chronic kidney disease) stage 3, GFR 30-59 ml/min (HCC) 08/04/2017   Primary osteoarthritis of first carpometacarpal joint of left hand 07/10/2017   Strain of right Achilles tendon 04/14/2017   Atypical nevus  08/22/2016   Acute bilateral low back pain with left-sided sciatica 06/06/2016   Menopause 12/08/2015   History of colonoscopy 07/17/2015   Irregular heartbeat 07/26/2014   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[place]) 01/16/2014   Irritable bowel syndrome 09/21/2013   Hyperlipidemia 04/05/2013   Hypertension 12/06/2012   Peripheral arterial disease ([place]) 12/06/2012   CAD, cath 2009 12/06/2012   Hyponatremia, improved holding diuretic 12/06/2012   Lymphocele of left arm 07/14/2012   Atherosclerosis of other  specified arteries 04/02/2012   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   Stricture of artery ([place]) 10/02/2011   Allergic dermatitis 06/09/2011    PCP: Dr Hali Marry   REFERRING PROVIDER: Dr Aundria Mems  REFERRING DIAG: Cervical DDD  THERAPY DIAG:  Cervicalgia  Abnormal posture  Muscle weakness (generalized)  Other symptoms and signs involving the musculoskeletal system  Pain in left leg  Pain in right leg  Rationale for Evaluation and Treatment: Rehabilitation  ONSET DATE: 05/31/22  SUBJECTIVE:                                                                                                                                                                                                         SUBJECTIVE STATEMENT: Patient reports that she has been working on her exercises at home. She has good days and bad days depending on use of UE's. She did grocery shopping today and had to pull cart and put the groceries in the cabinets. Her chest is no longer hurting like it was.   PERTINENT HISTORY:  Neck and shoulder pain over the past several weeks which has progressively worsened in the past few weeks. She has tried some exercises at home with no significant change. She was seen by neurosurgeon and treated with medication.DDD cervical spine with surgery C6; lumbar DDD with fusion 2011; bilat hip/LE pain; cardiac problems Abdominal aortic bypass; Lt subclavian bypass; arthritis; osteoporosis      PAIN:  Are you having pain? Yes: NPRS scale: 5/10 Pain location: Rt neck into Rt shoulder and upper arm Pain description: nagging  Aggravating factors: lying on Rt side; lifting; computer; reading  Relieving factors: topical analgesics; heat   PRECAUTIONS: Lt subclavian bybass; AAA bypass   WEIGHT BEARING RESTRICTIONS: No  FALLS:  Has patient fallen in last 6 months? No   OCCUPATION: retired; active with household chores and dancing; reading; some computer  work; Rt hand dominate    PATIENT GOALS: get rid of pain and use the arm again; dance again   NEXT MD  VISIT: 08/22/22  OBJECTIVE:   DIAGNOSTIC FINDINGS:  MRI 03/04/21 - Mild cervical degenerative disc disease without spinal canal or neural foraminal stenosis.  PATIENT SURVEYS:  FOTO 49 goal 60  SENSATION: Rt UE numbness and tingling glove like intermittently daily lasting ~ 1-2 minutes   POSTURE: Patient presents with head forward posture with increased thoracic kyphosis; shoulders rounded and elevated; scapulae abducted and rotated along the thoracic spine; head of the humerus anterior in orientation.   PALPATION: Muscular tightness ant/lat/post cervical musculature; pecs; upper trap; leveator; biceps Rt > Lt   CERVICAL ROM:   Active ROM A/PROM (deg) eval  Flexion 39  Extension 24  Right lateral flexion 29  Left lateral flexion 34  Right rotation 54  Left rotation 53   (Blank rows = not tested)  UPPER EXTREMITY ROM:  Active ROM Right eval Left eval  Shoulder flexion 145 150  Shoulder extension    Shoulder abduction 145 150  Shoulder adduction    Shoulder extension    Shoulder internal rotation Thumb T12 Thumb T7  Shoulder external rotation 75 80  Elbow flexion    Elbow extension    Wrist flexion    Wrist extension    Wrist ulnar deviation    Wrist radial deviation    Wrist pronation    Wrist supination     (Blank rows = not tested)  UPPER EXTREMITY MMT:  MMT Right eval Left eval  Shoulder flexion    Shoulder extension    Shoulder abduction    Shoulder adduction    Shoulder extension    Shoulder internal rotation    Shoulder external rotation    Middle trapezius 4 4  Lower trapezius    Elbow flexion    Elbow extension    Wrist flexion    Wrist extension    Wrist ulnar deviation    Wrist radial deviation    Wrist pronation    Wrist supination    Grip strength 18 45   (Blank rows = not tested)   TODAY'S TREATMENT:                                                                                                                               [facility] Adult PT Treatment:                                                 DATE: 07/25/22 Therapeutic Exercise: Supine  Deep breathing x ~ 2 min  Chin tuck 5 sec x 10  Scap squeeze 5 sec x 10  T UE's out to side ~ 2 min bending elbows to release stretch as needed  Sitting   Chin tuck 5 sec x 5  Scap squeeze 5 sec x 5  Standing   Chin tuck 5 sec x 5  Scap squeeze 5 sec x 10   ER elbows at 90 degrees w/noodle x 3 sec x 10  Sidelying  Lt sidelying scap retraction 10-15 reps arm resting on Rt hip  Manual Therapy: Soft tissue mobilization w/ pt supine working through the ant/lat/post cervical and thoracic spine into the Rt posterior scapular area and teres musculature Neuromuscular re-ed: Postural correction and education engaging posterior shoulder girdle musculature Therapeutic Activity: Diaphragmatic breathing count 6 inhale, hold, exhale  Modalities: MH cervical and thoracic spine x 10 min  Self Care: Sitting with noodle; supporting UE's on pillows for reading   DATE: 07/17/22 Therapeutic Exercise: Supine  Deep breathing x ~ 2 min  Chin tuck 5 sec x 10  Scap squeeze 5 sec x 10  T UE's out to side ~ 1 min bending elbows to release stretch as needed  Sitting   Chin tuck 5 sec x 5  Scap squeeze 5 sec x 5  Standing   Chin tuck 5 sec x 5   Scap squeeze 5 sec x 5   ER elbows at 90 degrees 's with noodle x 3 sec x 10  Manual Therapy:  Neuromuscular re-ed: Postural correction and education engaging posterior shoulder girdle musculature Therapeutic Activity: Diaphragmatic breathing count 6 inhale, hold, exhale  Modalities: MH cervical and thoracic spine x 10 min  Self Care: Sitting with noodle; supporting UE's on pillows for reading   PATIENT EDUCATION:  Education details: POC; HEP  Person educated: Patient Education method: Explanation, Demonstration, Tactile cues, Verbal  cues, and Handouts Education comprehension: verbalized understanding, returned demonstration, verbal cues required, tactile cues required, and needs further education  HOME EXERCISE PROGRAM: Access Code: [email or web address] URL: [email or web address] Date: 07/17/2022 Prepared by: Gillermo Murdoch  Exercises - Supine Diaphragmatic Breathing  - 2 x daily - 7 x weekly - 1 sets - 10 reps - 4-6 sec  hold - Supine Cervical Retraction with Towel  - 2 x daily - 7 x weekly - 1 sets - 5-10 reps - 10 sec  hold - Supine Scapular Retraction  - 2 x daily - 7 x weekly - 1 sets - 10 reps - 5-10 sec  hold - Supine Thoracic Mobilization Towel Roll Vertical with Arm Stretch  - 2 x daily - 7 x weekly - 1 sets - 1-3 reps - 2-3 min hold - Seated Cervical Retraction  - 3 x daily - 7 x weekly - 1 sets - 10 reps - Standing Scapular Retraction  - 3 x daily - 7 x weekly - 1 sets - 10 reps - 10 hold - Shoulder External Rotation and Scapular Retraction  - 3 x daily - 7 x weekly - 1 sets - 10 reps -   hold  Patient Education - Office Posture  ASSESSMENT:  CLINICAL IMPRESSION: Patient reports some improvement in radicular tinging Rt UE - tinging is less intense and does not extend to hand as far. Note continued tightness to palpation with manual work. Reviewed and progressed with gentle exercises in supine.   OBJECTIVE IMPAIRMENTS: cervical dysfunction with Rt > Lt UE radiculopathy. She has had symptoms in the past 6+ weeks including pain and discomfort with radicular pain in the neck and Rt shoulder; poor posture and alignment; limited cervical and bilat shoulder ROM; Rt UE weakness; limited functional activity level; decreased activity tolerance, decreased mobility, decreased ROM, decreased strength, hypomobility, increased fascial restrictions, impaired flexibility, impaired UE functional use, improper body mechanics, postural dysfunction, and pain.   GOALS: Goals reviewed with  patient? Yes  SHORT TERM GOALS: Target  date: 08/13/2022  Improve posture and alignment with patient to demonstrate increased upright posture with posterior shoulder girdle engaged Baseline:  Goal status: INITIAL  2.  Independent in initial HEP  Baseline:  Goal status: INITIAL  3.  Patient to demonstrate and report improved positions for sitting activities  Baseline:  Goal status: INITIAL  LONG TERM GOALS: Target date: 09/11/2022   Decrease pain in neck and shoulder by 75-100% allowing patient to return to more normal functional activities Baseline:  Goal status: INITIAL  2.  Increase AROM cervical spine to WFL's with minimal to no pain  Baseline:  Goal status: INITIAL  3.  Increase strength Rt UE grip strength to 40-45#  Baseline:  Goal status: INITIAL  4.  Improve posture and alignment to increase posterior shoulder girdle strength with posterior shoulder girdle musculature engaged  Baseline:  Goal status: INITIAL  5.  Independent in HEP including aquatic program as indicated  Baseline:  Goal status: INITIAL  6.  Improve functional limitation score to 60 Baseline:  Goal status: INITIAL   PLAN:  PT FREQUENCY: 2x/wk  PT DURATION: 8 weeks  PLANNED INTERVENTIONS: Therapeutic exercises, Therapeutic activity, Neuromuscular re-education, Patient/Family education, Self Care, Joint mobilization, Aquatic Therapy, Dry Needling, Spinal mobilization, Cryotherapy, Moist heat, Taping, Traction, Ultrasound, Ionotophoresis '4mg'$ /ml Dexamethasone, Manual therapy, and Re-evaluation  PLAN FOR NEXT SESSION: review and progress exercise; postural correction and education; manual work, no DN, modalities as indicated    [employer], PT 07/25/2022, 1:57 PM

## 2022-07-30 ENCOUNTER — Encounter: Payer: Medicare Other | Admitting: Rehabilitative and Restorative Service Providers"

## 2022-08-01 ENCOUNTER — Ambulatory Visit: Payer: Medicare Other | Admitting: Rehabilitative and Restorative Service Providers"

## 2022-08-01 ENCOUNTER — Encounter: Payer: Self-pay | Admitting: Rehabilitative and Restorative Service Providers"

## 2022-08-01 DIAGNOSIS — M542 Cervicalgia: Secondary | ICD-10-CM

## 2022-08-01 DIAGNOSIS — R293 Abnormal posture: Secondary | ICD-10-CM

## 2022-08-01 DIAGNOSIS — R29898 Other symptoms and signs involving the musculoskeletal system: Secondary | ICD-10-CM

## 2022-08-01 DIAGNOSIS — M6281 Muscle weakness (generalized): Secondary | ICD-10-CM | POA: Diagnosis not present

## 2022-08-01 DIAGNOSIS — M79605 Pain in left leg: Secondary | ICD-10-CM | POA: Diagnosis not present

## 2022-08-01 DIAGNOSIS — M79604 Pain in right leg: Secondary | ICD-10-CM | POA: Diagnosis not present

## 2022-08-01 NOTE — Therapy (Signed)
OUTPATIENT PHYSICAL THERAPY CERVICAL TREATMENT   Patient Name: Traci [name] MRN: [id number] DOB:August 06, 1945, 77 y.o., female Today's Date: 08/01/2022  END OF SESSION:  PT End of Session - 08/01/22 1352     Visit Number 3    Number of Visits 16    Date for PT Re-Evaluation 09/11/22    PT Start Time 1352    PT Stop Time 1440    PT Time Calculation (min) 48 min    Activity Tolerance Patient tolerated treatment well             Past Medical History:  Diagnosis Date   Allergy 1995   Contrast dye   Anxiety    Atrial fibrillation (HCC)    CAD (coronary artery disease)    CKD (chronic kidney disease) stage 3, GFR 30-59 ml/min ([place]) 08/04/2017   Claudication ([place]) 01/06/2008   Lower extremity dopplers - no evidence of arterial insufficiency, normal exam   Coronary artery disease    GERD (gastroesophageal reflux disease)    Heart murmur    Hyperlipidemia    Hypertension 11/30/2010   echo- EF 55%; normal w/ mildly sclerotic aortic valve   Hypertension 11/05/2011   renal dopplers - celiac artery and SMA >50% diameter reduction, R renal artery - mildly elevated velocities 1-59% diameter reduction, L renal artery normal   Nonspecific ST-T wave electrocardiographic changes 03/26/2011   R/Lmv - EF 74%, normal perfusion all regions, ST depression w/ Lexiscan infusion w/o assoc angina   Osteopenia    PAD (peripheral artery disease) (HCC)    Peripheral vascular disease (HCC)    PVD (peripheral vascular disease) ([place]) 11/05/2011   doppler - R/L brachial pressures essentially equal w/o inflow disease; L sublclavian/CCA bypass graft demonstrates patent flow, no evidence of significant stenosis   Sigmoid diverticulitis    Past Surgical History:  Procedure Laterality Date   ABDOMINAL AORTIC ANEURYSM REPAIR N/A 01/27/2014   Procedure: AORTO-SUPERIOR MESENTERIC ARTERY BYPASS GRAFT;  Surgeon: Angelia Mould, MD;  Location: [facility];  Service: Vascular;  Laterality: N/A;    ABDOMINAL HYSTERECTOMY     APPENDECTOMY     CARDIAC CATHETERIZATION  06/03/2008   60% LAD, involving D2, borderline significant by IVUS, medical therapy. CFX, RCA OK.   CATARACT EXTRACTION     CHOLECYSTECTOMY     EYE SURGERY     retinal surgery   SMALL INTESTINE SURGERY     SPINE SURGERY  07/2009   SUBCLAVIAN ARTERY STENT  1995   X's  several   SUBCLAVIAN ARTERY STENT Left 09/20/2008   LSA ISR 7x32m [employer] on Opta premount, reduction from 80% to 0%   subclavian artery stents  01/23/2010   Left carotid to subclavian artery Bypass   VISCERAL ANGIOGRAM  01/17/2014   Procedure: VISCERAL ANGIOGRAM;  Surgeon: CAngelia Mould MD;  [facility];  Service: Cardiovascular;;   Patient Active Problem List   Diagnosis Date Noted   DDD (degenerative disc disease), cervical 06/21/2022   PAT (paroxysmal atrial tachycardia) 03/01/2022   Bilateral knee pain 01/02/2022   Sprain of anterior talofibular ligament of left ankle 09/28/2020   Edema 09/03/2019   Epigastric pain 08/11/2019   Insomnia 08/10/2019   Hypocalcemia 06/03/2019   Atherosclerosis of aorta ([place] 01/04/2018   Osteoporosis 12/25/2017   CKD (chronic kidney disease) stage 3, GFR 30-59 ml/min (HCC) 08/04/2017   Primary osteoarthritis of first carpometacarpal joint of left hand 07/10/2017   Strain of right Achilles tendon 04/14/2017   Atypical nevus  08/22/2016   Acute bilateral low back pain with left-sided sciatica 06/06/2016   Menopause 12/08/2015   History of colonoscopy 07/17/2015   Irregular heartbeat 07/26/2014   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[place]) 01/16/2014   Irritable bowel syndrome 09/21/2013   Hyperlipidemia 04/05/2013   Hypertension 12/06/2012   Peripheral arterial disease ([place]) 12/06/2012   CAD, cath 2009 12/06/2012   Hyponatremia, improved holding diuretic 12/06/2012   Lymphocele of left arm 07/14/2012   Atherosclerosis of other  specified arteries 04/02/2012   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   Stricture of artery ([place]) 10/02/2011   Allergic dermatitis 06/09/2011    PCP: Dr Hali Marry   REFERRING PROVIDER: Dr Aundria Mems  REFERRING DIAG: Cervical DDD  THERAPY DIAG:  Cervicalgia  Abnormal posture  Muscle weakness (generalized)  Other symptoms and signs involving the musculoskeletal system  Rationale for Evaluation and Treatment: Rehabilitation  ONSET DATE: 05/31/22  SUBJECTIVE:                                                                                                                                                                                                         SUBJECTIVE STATEMENT: Patient reports that she is improving. She has been working on her exercises at home. She has good days and bad days depending on use of UE's. The shoulder blade is not hurting but she feels discomfort in the mid to upper back.   PERTINENT HISTORY:  Neck and shoulder pain over the past several weeks which has progressively worsened in the past few weeks. She has tried some exercises at home with no significant change. She was seen by neurosurgeon and treated with medication.DDD cervical spine with surgery C6; lumbar DDD with fusion 2011; bilat hip/LE pain; cardiac problems Abdominal aortic bypass; Lt subclavian bypass; arthritis; osteoporosis      PAIN:  Are you having pain? Yes: NPRS scale: 3/10 Pain location: Rt neck into Rt shoulder and upper arm Pain description: nagging  Aggravating factors: lying on Rt side; lifting; computer; reading  Relieving factors: topical analgesics; heat   PRECAUTIONS: Lt subclavian bybass; AAA bypass   WEIGHT BEARING RESTRICTIONS: No  FALLS:  Has patient fallen in last 6 months? No   OCCUPATION: retired; active with household chores and dancing; reading; some computer work; Rt hand dominate    PATIENT GOALS: get rid of pain and use the  arm again; dance again   NEXT MD VISIT: 08/22/22  OBJECTIVE:   DIAGNOSTIC FINDINGS:  MRI 03/04/21 - Mild cervical degenerative disc disease  without spinal canal or neural foraminal stenosis.  PATIENT SURVEYS:  FOTO 49 goal 60  SENSATION: Rt UE numbness and tingling glove like intermittently daily lasting ~ 1-2 minutes  08/01/22: less tingling   POSTURE: Patient presents with head forward posture with increased thoracic kyphosis; shoulders rounded and elevated; scapulae abducted and rotated along the thoracic spine; head of the humerus anterior in orientation.   PALPATION: Muscular tightness ant/lat/post cervical musculature; pecs; upper trap; leveator; biceps Rt > Lt   CERVICAL ROM:   Active ROM A/PROM (deg) eval  Flexion 39  Extension 24  Right lateral flexion 29  Left lateral flexion 34  Right rotation 54  Left rotation 53   (Blank rows = not tested)  UPPER EXTREMITY ROM:  Active ROM Right eval Left eval  Shoulder flexion 145 150  Shoulder extension    Shoulder abduction 145 150  Shoulder adduction    Shoulder extension    Shoulder internal rotation Thumb T12 Thumb T7  Shoulder external rotation 75 80  Elbow flexion    Elbow extension    Wrist flexion    Wrist extension    Wrist ulnar deviation    Wrist radial deviation    Wrist pronation    Wrist supination     (Blank rows = not tested)  UPPER EXTREMITY MMT:  MMT Right eval Left eval  Shoulder flexion    Shoulder extension    Shoulder abduction    Shoulder adduction    Shoulder extension    Shoulder internal rotation    Shoulder external rotation    Middle trapezius 4 4  Lower trapezius    Elbow flexion    Elbow extension    Wrist flexion    Wrist extension    Wrist ulnar deviation    Wrist radial deviation    Wrist pronation    Wrist supination    Grip strength 18 45   (Blank rows = not tested)   TODAY'S TREATMENT:                                                                                                                               [facility] Adult PT Treatment:                                                 DATE: 08/01/22 Therapeutic Exercise: Supine  Deep breathing x ~ 2 min  Chin tuck 5 sec x 10  Scap squeeze 5 sec x 10  T UE's out to side ~ 2 min bending elbows to release stretch as needed  Sitting   Chin tuck 5 sec x 5  Scap squeeze 5 sec x 5  Standing   Doorway stretch lower 2 positions 30 sec x 2   Chin tuck 5 sec x 5   Scap squeeze  5 sec x 10   ER elbows at 90 degrees w/noodle x 3 sec x 10   Sidelying  Lt sidelying scap retraction 10-15 reps arm resting on Rt hip  W with noodle x 10  ER/scap squeeze w/ noodle yellow TB x 10  Sidelying    Manual Therapy: Soft tissue mobilization w/ pt siting -  working through the ant/lat/post cervical and thoracic spine into the Rt posterior scapular area and teres musculature Neuromuscular re-ed: Postural correction and education engaging posterior shoulder girdle musculature Therapeutic Activity: Diaphragmatic breathing count 6 inhale, hold, exhale  Modalities: MH cervical and thoracic spine x 10 min  Self Care: Sitting with coregeous ball; supporting UE's on pillows for TV/reading DATE: 07/25/22 Therapeutic Exercise: Supine  Deep breathing x ~ 2 min  Chin tuck 5 sec x 10  Scap squeeze 5 sec x 10  T UE's out to side ~ 2 min bending elbows to release stretch as needed  Sitting   Chin tuck 5 sec x 5  Scap squeeze 5 sec x 5   Sitting with coregeous ball T spine  Standing   Chin tuck 5 sec x 5   Scap squeeze 5 sec x 10   ER elbows at 90 degrees w/noodle x 3 sec x 10  Manual Therapy: Soft tissue mobilization w/ pt supine working through the ant/lat/post cervical and thoracic spine into the Rt posterior scapular area and teres musculature Neuromuscular re-ed: Postural correction and education engaging posterior shoulder girdle musculature Therapeutic Activity: Diaphragmatic breathing count 6 inhale, hold,  exhale  Modalities: MH cervical and thoracic spine x 10 min  Self Care:  Sitting with noodle; supporting UE's on pillows for reading   PATIENT EDUCATION:  Education details: POC; HEP  Person educated: Patient Education method: Consulting civil engineer, Demonstration, Tactile cues, Verbal cues, and Handouts Education comprehension: verbalized understanding, returned demonstration, verbal cues required, tactile cues required, and needs further education  HOME EXERCISE PROGRAM: Access Code: [email or web address] URL: [email or web address] Date: 08/01/2022 Prepared by: Gillermo Murdoch  Exercises - Supine Diaphragmatic Breathing  - 2 x daily - 7 x weekly - 1 sets - 10 reps - 4-6 sec  hold - Supine Cervical Retraction with Towel  - 2 x daily - 7 x weekly - 1 sets - 5-10 reps - 10 sec  hold - Supine Scapular Retraction  - 2 x daily - 7 x weekly - 1 sets - 10 reps - 5-10 sec  hold - Supine Thoracic Mobilization Towel Roll Vertical with Arm Stretch  - 2 x daily - 7 x weekly - 1 sets - 1-3 reps - 2-3 min hold - Seated Cervical Retraction  - 3 x daily - 7 x weekly - 1 sets - 10 reps - Standing Scapular Retraction  - 3 x daily - 7 x weekly - 1 sets - 10 reps - 10 hold - Shoulder External Rotation and Scapular Retraction  - 3 x daily - 7 x weekly - 1 sets - 10 reps -   hold - Shoulder External Rotation and Scapular Retraction with Resistance  - 2 x daily - 7 x weekly - 1 sets - 10 reps - 3-5 sec  hold - Shoulder External Rotation in 45 Degrees Abduction  - 2 x daily - 7 x weekly - 1-2 sets - 10 reps - 3 sec  hold - Doorway Pec Stretch at 60 Degrees Abduction  - 3 x daily - 7 x weekly - 1 sets - 3 reps - Doorway Pec Stretch  at 90 Degrees Abduction  - 3 x daily - 7 x weekly - 1 sets - 3 reps - 30 seconds  hold  Patient Education - Office Posture  ASSESSMENT:  CLINICAL IMPRESSION: Patient reports continued improvement in radicular tinging Rt UE - tinging is less intense and does not extend to hand. Note  decreased tightness to palpation with manual work. Reviewed and progressed with exercises in standing including theraband strengthening and gentle stretch for pecs. Progressing well toward stated goals of therapy.   OBJECTIVE IMPAIRMENTS: cervical dysfunction with Rt > Lt UE radiculopathy. She has had symptoms in the past 6+ weeks including pain and discomfort with radicular pain in the neck and Rt shoulder; poor posture and alignment; limited cervical and bilat shoulder ROM; Rt UE weakness; limited functional activity level; decreased activity tolerance, decreased mobility, decreased ROM, decreased strength, hypomobility, increased fascial restrictions, impaired flexibility, impaired UE functional use, improper body mechanics, postural dysfunction, and pain.   GOALS: Goals reviewed with patient? Yes  SHORT TERM GOALS: Target date: 08/13/2022  Improve posture and alignment with patient to demonstrate increased upright posture with posterior shoulder girdle engaged Baseline:  Goal status: INITIAL  2.  Independent in initial HEP  Baseline:  Goal status: INITIAL  3.  Patient to demonstrate and report improved positions for sitting activities  Baseline:  Goal status: INITIAL  LONG TERM GOALS: Target date: 09/11/2022   Decrease pain in neck and shoulder by 75-100% allowing patient to return to more normal functional activities Baseline:  Goal status: INITIAL  2.  Increase AROM cervical spine to WFL's with minimal to no pain  Baseline:  Goal status: INITIAL  3.  Increase strength Rt UE grip strength to 40-45#  Baseline:  Goal status: INITIAL  4.  Improve posture and alignment to increase posterior shoulder girdle strength with posterior shoulder girdle musculature engaged  Baseline:  Goal status: INITIAL  5.  Independent in HEP including aquatic program as indicated  Baseline:  Goal status: INITIAL  6.  Improve functional limitation score to 60 Baseline:  Goal status:  INITIAL   PLAN:  PT FREQUENCY: 2x/wk  PT DURATION: 8 weeks  PLANNED INTERVENTIONS: Therapeutic exercises, Therapeutic activity, Neuromuscular re-education, Patient/Family education, Self Care, Joint mobilization, Aquatic Therapy, Dry Needling, Spinal mobilization, Cryotherapy, Moist heat, Taping, Traction, Ultrasound, Ionotophoresis 105m/ml Dexamethasone, Manual therapy, and Re-evaluation  PLAN FOR NEXT SESSION: review and progress exercise; postural correction and education; manual work, no DN, modalities as indicated    [employer] PT 08/01/2022, 1:52 PM

## 2022-08-02 ENCOUNTER — Ambulatory Visit: Payer: Medicare Other | Admitting: Sports Medicine

## 2022-08-06 ENCOUNTER — Encounter: Payer: Self-pay | Admitting: Rehabilitative and Restorative Service Providers"

## 2022-08-06 ENCOUNTER — Ambulatory Visit: Payer: Medicare Other | Admitting: Rehabilitative and Restorative Service Providers"

## 2022-08-06 DIAGNOSIS — M542 Cervicalgia: Secondary | ICD-10-CM

## 2022-08-06 DIAGNOSIS — R29898 Other symptoms and signs involving the musculoskeletal system: Secondary | ICD-10-CM

## 2022-08-06 DIAGNOSIS — M6281 Muscle weakness (generalized): Secondary | ICD-10-CM | POA: Diagnosis not present

## 2022-08-06 DIAGNOSIS — R293 Abnormal posture: Secondary | ICD-10-CM | POA: Diagnosis not present

## 2022-08-06 DIAGNOSIS — M79604 Pain in right leg: Secondary | ICD-10-CM | POA: Diagnosis not present

## 2022-08-06 DIAGNOSIS — M79605 Pain in left leg: Secondary | ICD-10-CM | POA: Diagnosis not present

## 2022-08-06 NOTE — Therapy (Signed)
OUTPATIENT PHYSICAL THERAPY CERVICAL TREATMENT   Patient Name: Traci [name] MRN: [id number] DOB:03/17/1946, 77 y.o., female Today's Date: 08/06/2022  END OF SESSION:  PT End of Session - 08/06/22 1313     Visit Number 4    Number of Visits 16    Date for PT Re-Evaluation 09/11/22    PT Start Time [id number]    PT Stop Time [id number]    PT Time Calculation (min) 49 min    Activity Tolerance Patient tolerated treatment well             Past Medical History:  Diagnosis Date   Allergy 1995   Contrast dye   Anxiety    Atrial fibrillation ([place])    CAD (coronary artery disease)    CKD (chronic kidney disease) stage 3, GFR 30-59 ml/min ([place]) 08/04/2017   Claudication ([place]) 01/06/2008   Lower extremity dopplers - no evidence of arterial insufficiency, normal exam   Coronary artery disease    GERD (gastroesophageal reflux disease)    Heart murmur    Hyperlipidemia    Hypertension 11/30/2010   echo- EF 55%; normal w/ mildly sclerotic aortic valve   Hypertension 11/05/2011   renal dopplers - celiac artery and SMA >50% diameter reduction, R renal artery - mildly elevated velocities 1-59% diameter reduction, L renal artery normal   Nonspecific ST-T wave electrocardiographic changes 03/26/2011   R/Lmv - EF 74%, normal perfusion all regions, ST depression w/ Lexiscan infusion w/o assoc angina   Osteopenia    PAD (peripheral artery disease) ([facility])    Peripheral vascular disease ([facility])    PVD (peripheral vascular disease) ([place]) 11/05/2011   doppler - R/L brachial pressures essentially equal w/o inflow disease; L sublclavian/CCA bypass graft demonstrates patent flow, no evidence of significant stenosis   Sigmoid diverticulitis    Past Surgical History:  Procedure Laterality Date   ABDOMINAL AORTIC ANEURYSM REPAIR N/A 01/27/2014   Procedure: AORTO-SUPERIOR MESENTERIC ARTERY BYPASS GRAFT;  Surgeon: Angelia Mould, MD;  Location: [facility];  Service: Vascular;  Laterality: N/A;    ABDOMINAL HYSTERECTOMY     APPENDECTOMY     CARDIAC CATHETERIZATION  06/03/2008   60% LAD, involving D2, borderline significant by IVUS, medical therapy. CFX, RCA OK.   CATARACT EXTRACTION     CHOLECYSTECTOMY     EYE SURGERY     retinal surgery   SMALL INTESTINE SURGERY     SPINE SURGERY  07/2009   SUBCLAVIAN ARTERY STENT  1995   X's  several   SUBCLAVIAN ARTERY STENT Left 09/20/2008   LSA ISR 7x36m Cordis Genesis on Opta premount, reduction from 80% to 0%   subclavian artery stents  01/23/2010   Left carotid to subclavian artery Bypass   VISCERAL ANGIOGRAM  01/17/2014   Procedure: VISCERAL ANGIOGRAM;  Surgeon: CAngelia Mould MD;  [facility];  Service: Cardiovascular;;   Patient Active Problem List   Diagnosis Date Noted   DDD (degenerative disc disease), cervical 06/21/2022   PAT (paroxysmal atrial tachycardia) 03/01/2022   Bilateral knee pain 01/02/2022   Sprain of anterior talofibular ligament of left ankle 09/28/2020   Edema 09/03/2019   Epigastric pain 08/11/2019   Insomnia 08/10/2019   Hypocalcemia 06/03/2019   Atherosclerosis of aorta ([place] 01/04/2018   Osteoporosis 12/25/2017   CKD (chronic kidney disease) stage 3, GFR 30-59 ml/min ([facility]) 08/04/2017   Primary osteoarthritis of first carpometacarpal joint of left hand 07/10/2017   Strain of right Achilles tendon 04/14/2017   Atypical nevus  08/22/2016   Acute bilateral low back pain with left-sided sciatica 06/06/2016   Menopause 12/08/2015   History of colonoscopy 07/17/2015   Irregular heartbeat 07/26/2014   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[place]) 01/16/2014   Irritable bowel syndrome 09/21/2013   Hyperlipidemia 04/05/2013   Hypertension 12/06/2012   Peripheral arterial disease ([place]) 12/06/2012   CAD, cath 2009 12/06/2012   Hyponatremia, improved holding diuretic 12/06/2012   Lymphocele of left arm 07/14/2012   Atherosclerosis of other  specified arteries 04/02/2012   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   Stricture of artery ([place]) 10/02/2011   Allergic dermatitis 06/09/2011    PCP: Dr Hali Marry   REFERRING PROVIDER: Dr Aundria Mems  REFERRING DIAG: Cervical DDD  THERAPY DIAG:  Cervicalgia  Abnormal posture  Muscle weakness (generalized)  Other symptoms and signs involving the musculoskeletal system  Rationale for Evaluation and Treatment: Rehabilitation  ONSET DATE: 05/31/22  SUBJECTIVE:                                                                                                                                                                                                         SUBJECTIVE STATEMENT: Patient reports that she is less pain into the Rt arm. Can tell she is improving. She has been working on her exercises at home. She has good days and bad days depending on use of UE's. The shoulder blade is not hurting but she feels discomfort in the mid to upper back but not as much.   PERTINENT HISTORY:  Neck and shoulder pain over the past several weeks which has progressively worsened in the past few weeks. She has tried some exercises at home with no significant change. She was seen by neurosurgeon and treated with medication.DDD cervical spine with surgery C6; lumbar DDD with fusion 2011; bilat hip/LE pain; cardiac problems Abdominal aortic bypass; Lt subclavian bypass; arthritis; osteoporosis      PAIN:  Are you having pain? Yes:  NPRS scale: 3/10 on an intermittent basis Pain location: Rt neck into Rt shoulder and upper arm Pain description: nagging  Aggravating factors: lying on Rt side; lifting; computer; reading  Relieving factors: topical analgesics; heat   PRECAUTIONS: Lt subclavian bybass; AAA bypass   WEIGHT BEARING RESTRICTIONS: No  FALLS:  Has patient fallen in last 6 months? No   OCCUPATION: retired; active with household chores and dancing; reading;  some computer work; Rt hand dominate    PATIENT GOALS: get rid of pain and use the arm again; dance again  NEXT MD VISIT: 08/22/22  OBJECTIVE:   DIAGNOSTIC FINDINGS:  MRI 03/04/21 - Mild cervical degenerative disc disease without spinal canal or neural foraminal stenosis.  PATIENT SURVEYS:  FOTO 49 goal 60  SENSATION: Rt UE numbness and tingling glove like intermittently daily lasting ~ 1-2 minutes  08/01/22: less tingling   POSTURE: Patient presents with head forward posture with increased thoracic kyphosis; shoulders rounded and elevated; scapulae abducted and rotated along the thoracic spine; head of the humerus anterior in orientation.   PALPATION: Muscular tightness ant/lat/post cervical musculature; pecs; upper trap; leveator; biceps Rt > Lt   CERVICAL ROM:   Active ROM A/PROM (deg) eval  Flexion 39  Extension 24  Right lateral flexion 29  Left lateral flexion 34  Right rotation 54  Left rotation 53   (Blank rows = not tested)  UPPER EXTREMITY ROM:  Active ROM Right eval Left eval  Shoulder flexion 145 150  Shoulder extension    Shoulder abduction 145 150  Shoulder adduction    Shoulder extension    Shoulder internal rotation Thumb T12 Thumb T7  Shoulder external rotation 75 80  Elbow flexion    Elbow extension    Wrist flexion    Wrist extension    Wrist ulnar deviation    Wrist radial deviation    Wrist pronation    Wrist supination     (Blank rows = not tested)  UPPER EXTREMITY MMT:  MMT Right eval Left eval  Shoulder flexion    Shoulder extension    Shoulder abduction    Shoulder adduction    Shoulder extension    Shoulder internal rotation    Shoulder external rotation    Middle trapezius 4 4  Lower trapezius    Elbow flexion    Elbow extension    Wrist flexion    Wrist extension    Wrist ulnar deviation    Wrist radial deviation    Wrist pronation    Wrist supination    Grip strength 18 45   (Blank rows = not  tested)   TODAY'S TREATMENT:                                                                                                                              [facility] Adult PT Treatment:                                                 DATE: 08/06/22 Therapeutic Exercise: Supine  Deep breathing x ~ 2 min  Chin tuck 5 sec x 10  Scap squeeze 5 sec x 10  T UE's out to side ~ 2 min bending elbows to release stretch as needed  Sitting   Chin tuck 5 sec x 5  Scap squeeze 5 sec x 5  Standing   Doorway stretch  lower 2 positions 30 sec x 2   Chin tuck 5 sec x 5   Scap squeeze 5 sec x 10   ER elbows at 90 degrees w/noodle x 3 sec x 10   Sidelying  Lt sidelying scap retraction 10-15 reps arm resting on Rt hip  W with noodle x 10  ER/scap squeeze w/ noodle yellow TB x 10  Row green TB 3 sec x 10 x 2  Bow and arrow green TB 3 sec 10 x 2    Manual Therapy: Soft tissue mobilization w/ pt siting -  working through the ant/lat/post cervical and thoracic spine into the Rt posterior scapular area and teres musculature Neuromuscular re-ed: Postural correction and education engaging posterior shoulder girdle musculature Therapeutic Activity: Diaphragmatic breathing count 6 inhale, hold, exhale  Modalities: MH cervical and thoracic spine x 10 min  Self Care: Sitting with coregeous ball; supporting UE's on pillows for TV/reading  [facility] Adult PT Treatment:                                                 DATE: 08/01/22 Therapeutic Exercise: Supine  Deep breathing x ~ 2 min  Chin tuck 5 sec x 10  Scap squeeze 5 sec x 10  T UE's out to side ~ 2 min bending elbows to release stretch as needed  Sitting   Chin tuck 5 sec x 5  Scap squeeze 5 sec x 5  Standing   Doorway stretch lower 2 positions 30 sec x 2   Chin tuck 5 sec x 5   Scap squeeze 5 sec x 10   ER elbows at 90 degrees w/noodle x 3 sec x 10   Sidelying  Lt sidelying scap retraction 10-15 reps arm resting on Rt hip  W with noodle x 10  ER/scap  squeeze w/ noodle yellow TB x 10  Sidelying    Manual Therapy: Soft tissue mobilization w/ pt siting -  working through the ant/lat/post cervical and thoracic spine into the Rt posterior scapular area and teres musculature Neuromuscular re-ed: Postural correction and education engaging posterior shoulder girdle musculature Therapeutic Activity: Diaphragmatic breathing count 6 inhale, hold, exhale  Modalities: MH cervical and thoracic spine x 10 min  Self Care: Sitting with coregeous ball; supporting UE's on pillows for TV/reading  PATIENT EDUCATION:  Education details: POC; HEP  Person educated: Patient Education method: Consulting civil engineer, Demonstration, Tactile cues, Verbal cues, and Handouts Education comprehension: verbalized understanding, returned demonstration, verbal cues required, tactile cues required, and needs further education  HOME EXERCISE PROGRAM: Access Code: [email or web address] URL: [email or web address] Date: 08/06/2022 Prepared by: Gillermo Murdoch  Exercises - Supine Diaphragmatic Breathing  - 2 x daily - 7 x weekly - 1 sets - 10 reps - 4-6 sec  hold - Supine Cervical Retraction with Towel  - 2 x daily - 7 x weekly - 1 sets - 5-10 reps - 10 sec  hold - Supine Scapular Retraction  - 2 x daily - 7 x weekly - 1 sets - 10 reps - 5-10 sec  hold - Supine Thoracic Mobilization Towel Roll Vertical with Arm Stretch  - 2 x daily - 7 x weekly - 1 sets - 1-3 reps - 2-3 min hold - Seated Cervical Retraction  - 3 x daily - 7 x weekly - 1 sets - 10 reps - Standing Scapular  Retraction  - 3 x daily - 7 x weekly - 1 sets - 10 reps - 10 hold - Shoulder External Rotation and Scapular Retraction  - 3 x daily - 7 x weekly - 1 sets - 10 reps -   hold - Shoulder External Rotation and Scapular Retraction with Resistance  - 2 x daily - 7 x weekly - 1 sets - 10 reps - 3-5 sec  hold - Shoulder External Rotation in 45 Degrees Abduction  - 2 x daily - 7 x weekly - 1-2 sets - 10 reps - 3 sec  hold -  Doorway Pec Stretch at 60 Degrees Abduction  - 3 x daily - 7 x weekly - 1 sets - 3 reps - Doorway Pec Stretch at 90 Degrees Abduction  - 3 x daily - 7 x weekly - 1 sets - 3 reps - 30 seconds  hold - Standing Bilateral Low Shoulder Row with Anchored Resistance  - 2 x daily - 7 x weekly - 1-3 sets - 10 reps - 2-3 sec  hold - Drawing Bow  - 1 x daily - 7 x weekly - 1 sets - 10 reps - 3 sec  hold  Patient Education - Office Posture  ASSESSMENT:  CLINICAL IMPRESSION: Patient reports continued improvement in pain with decreased radicular tinging Rt UE - tinging is less intense and does not extend to hand. Note continued decreased tightness to palpation with manual work. Reviewed and progressed with exercises in standing including theraband strengthening and gentle stretch for pecs. Progressing well toward stated goals of therapy.   OBJECTIVE IMPAIRMENTS: cervical dysfunction with Rt > Lt UE radiculopathy. She has had symptoms in the past 6+ weeks including pain and discomfort with radicular pain in the neck and Rt shoulder; poor posture and alignment; limited cervical and bilat shoulder ROM; Rt UE weakness; limited functional activity level; decreased activity tolerance, decreased mobility, decreased ROM, decreased strength, hypomobility, increased fascial restrictions, impaired flexibility, impaired UE functional use, improper body mechanics, postural dysfunction, and pain.   GOALS: Goals reviewed with patient? Yes  SHORT TERM GOALS: Target date: 08/13/2022  Improve posture and alignment with patient to demonstrate increased upright posture with posterior shoulder girdle engaged Baseline:  Goal status: INITIAL  2.  Independent in initial HEP  Baseline:  Goal status: INITIAL  3.  Patient to demonstrate and report improved positions for sitting activities  Baseline:  Goal status: INITIAL  LONG TERM GOALS: Target date: 09/11/2022   Decrease pain in neck and shoulder by 75-100% allowing  patient to return to more normal functional activities Baseline:  Goal status: INITIAL  2.  Increase AROM cervical spine to WFL's with minimal to no pain  Baseline:  Goal status: INITIAL  3.  Increase strength Rt UE grip strength to 40-45#  Baseline:  Goal status: INITIAL  4.  Improve posture and alignment to increase posterior shoulder girdle strength with posterior shoulder girdle musculature engaged  Baseline:  Goal status: INITIAL  5.  Independent in HEP including aquatic program as indicated  Baseline:  Goal status: INITIAL  6.  Improve functional limitation score to 60 Baseline:  Goal status: INITIAL   PLAN:  PT FREQUENCY: 2x/wk  PT DURATION: 8 weeks  PLANNED INTERVENTIONS: Therapeutic exercises, Therapeutic activity, Neuromuscular re-education, Patient/Family education, Self Care, Joint mobilization, Aquatic Therapy, Dry Needling, Spinal mobilization, Cryotherapy, Moist heat, Taping, Traction, Ultrasound, Ionotophoresis 61m/ml Dexamethasone, Manual therapy, and Re-evaluation  PLAN FOR NEXT SESSION: review and progress exercise; postural correction and  education; manual work, no DN, modalities as indicated    [employer], PT 08/06/2022, 1:15 PM

## 2022-08-08 ENCOUNTER — Encounter: Payer: Self-pay | Admitting: Rehabilitative and Restorative Service Providers"

## 2022-08-08 ENCOUNTER — Ambulatory Visit: Payer: Medicare Other | Admitting: Rehabilitative and Restorative Service Providers"

## 2022-08-08 DIAGNOSIS — M6281 Muscle weakness (generalized): Secondary | ICD-10-CM | POA: Diagnosis not present

## 2022-08-08 DIAGNOSIS — M79604 Pain in right leg: Secondary | ICD-10-CM | POA: Diagnosis not present

## 2022-08-08 DIAGNOSIS — M542 Cervicalgia: Secondary | ICD-10-CM | POA: Diagnosis not present

## 2022-08-08 DIAGNOSIS — R293 Abnormal posture: Secondary | ICD-10-CM

## 2022-08-08 DIAGNOSIS — R29898 Other symptoms and signs involving the musculoskeletal system: Secondary | ICD-10-CM | POA: Diagnosis not present

## 2022-08-08 DIAGNOSIS — M79605 Pain in left leg: Secondary | ICD-10-CM | POA: Diagnosis not present

## 2022-08-08 NOTE — Therapy (Addendum)
OUTPATIENT PHYSICAL THERAPY CERVICAL TREATMENT AND DISCHARGE SUMMARY  PHYSICAL THERAPY DISCHARGE SUMMARY  Visits from Start of Care: 5  Current functional level related to goals / functional outcomes: SEE PROGRESS NOTE FOR DISCHARGE STATUS    Remaining deficits: UNKNOWN    Education / Equipment: HEP   Patient agrees to discharge. Patient goals were partially met. Patient is being discharged due to being pleased with the current functional level. Loney Peto P. Leonor Liv PT, MPH 10/09/22 4:13 PM  Patient Name: Traci [name] MRN: [id number] DOB:[birth year], 77 y.o., female Today's Date: 08/08/2022  END OF SESSION:  PT End of Session - 08/08/22 1406     Visit Number 5    Number of Visits 16    Date for PT Re-Evaluation 09/11/22    PT Start Time 1400    PT Stop Time 1450    PT Time Calculation (min) 50 min    Activity Tolerance Patient tolerated treatment well             Past Medical History:  Diagnosis Date   Allergy 1995   Contrast dye   Anxiety    Atrial fibrillation (HCC)    CAD (coronary artery disease)    CKD (chronic kidney disease) stage 3, GFR 30-59 ml/min (HCC) 08/04/2017   Claudication (HCC) 01/06/2008   Lower extremity dopplers - no evidence of arterial insufficiency, normal exam   Coronary artery disease    GERD (gastroesophageal reflux disease)    Heart murmur    Hyperlipidemia    Hypertension 11/30/2010   echo- EF 55%; normal w/ mildly sclerotic aortic valve   Hypertension 11/05/2011   renal dopplers - celiac artery and SMA >50% diameter reduction, R renal artery - mildly elevated velocities 1-59% diameter reduction, L renal artery normal   Nonspecific ST-T wave electrocardiographic changes 03/26/2011   R/Lmv - EF 74%, normal perfusion all regions, ST depression w/ Lexiscan infusion w/o assoc angina   Osteopenia    PAD (peripheral artery disease) (HCC)    Peripheral vascular disease (HCC)    PVD (peripheral vascular disease) (HCC) 11/05/2011    doppler - R/L brachial pressures essentially equal w/o inflow disease; L sublclavian/CCA bypass graft demonstrates patent flow, no evidence of significant stenosis   Sigmoid diverticulitis    Past Surgical History:  Procedure Laterality Date   ABDOMINAL AORTIC ANEURYSM REPAIR N/A 01/27/2014   Procedure: AORTO-SUPERIOR MESENTERIC ARTERY BYPASS GRAFT;  Surgeon: Chuck Hint, MD;  Location: [facility] OR;  Service: Vascular;  Laterality: N/A;   ABDOMINAL HYSTERECTOMY     APPENDECTOMY     CARDIAC CATHETERIZATION  06/03/2008   60% LAD, involving D2, borderline significant by IVUS, medical therapy. CFX, RCA OK.   CATARACT EXTRACTION     CHOLECYSTECTOMY     EYE SURGERY     retinal surgery   SMALL INTESTINE SURGERY     SPINE SURGERY  07/2009   SUBCLAVIAN ARTERY STENT  1995   X's  several   SUBCLAVIAN ARTERY STENT Left 09/20/2008   LSA ISR 7x63mm Cordis Genesis on Opta premount, reduction from 80% to 0%   subclavian artery stents  01/23/2010   Left carotid to subclavian artery Bypass   VISCERAL ANGIOGRAM  01/17/2014   Procedure: VISCERAL ANGIOGRAM;  Surgeon: Chuck Hint, MD;  Location: [facility] CATH LAB;  Service: Cardiovascular;;   Patient Active Problem List   Diagnosis Date Noted   DDD (degenerative disc disease), cervical 06/21/2022   PAT (paroxysmal atrial tachycardia) 03/01/2022   Bilateral knee pain 01/02/2022  Sprain of anterior talofibular ligament of left ankle 09/28/2020   Edema 09/03/2019   Epigastric pain 08/11/2019   Insomnia 08/10/2019   Hypocalcemia 06/03/2019   Atherosclerosis of aorta (HCC) 01/04/2018   Osteoporosis 12/25/2017   CKD (chronic kidney disease) stage 3, GFR 30-59 ml/min (HCC) 08/04/2017   Primary osteoarthritis of first carpometacarpal joint of left hand 07/10/2017   Strain of right Achilles tendon 04/14/2017   Atypical nevus 08/22/2016   Acute bilateral low back pain with left-sided sciatica 06/06/2016   Menopause 12/08/2015   History of  colonoscopy 07/17/2015   Irregular heartbeat 07/26/2014   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   Irritable bowel syndrome 09/21/2013   Hyperlipidemia 04/05/2013   Hypertension 12/06/2012   Peripheral arterial disease (HCC) 12/06/2012   CAD, cath 2009 12/06/2012   Hyponatremia, improved holding diuretic 12/06/2012   Lymphocele of left arm 07/14/2012   Atherosclerosis of other specified arteries 04/02/2012   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   Stricture of artery (HCC) 10/02/2011   Allergic dermatitis 06/09/2011    PCP: Dr Agapito Games   REFERRING PROVIDER: Dr Rodney Langton  REFERRING DIAG: Cervical DDD  THERAPY DIAG:  Cervicalgia  Abnormal posture  Muscle weakness (generalized)  Other symptoms and signs involving the musculoskeletal system  Rationale for Evaluation and Treatment: Rehabilitation  ONSET DATE: 05/31/22  SUBJECTIVE:                                                                                                                                                                                                         SUBJECTIVE STATEMENT: Patient reports that she is less and less pain and radicular symptoms into the Rt arm. Can tell she is still improving. She has been working on her exercises at home. She has good days and bad days depending on use of UE's. The shoulder blade is not hurting but she feels discomfort in the mid to upper back but not as much.   PERTINENT HISTORY:  Neck and shoulder pain over the past several weeks which has progressively worsened in the past few weeks. She has tried some exercises at home with no significant change. She was seen by neurosurgeon and treated with medication.DDD cervical spine with surgery C6; lumbar DDD with fusion 2011; bilat hip/LE pain; cardiac problems Abdominal aortic bypass; Lt subclavian bypass; arthritis; osteoporosis       PAIN:  Are you having pain? Yes:  NPRS scale: 2/10 on an intermittent basis Pain location: Rt neck into Rt shoulder and upper  arm Pain description: nagging  Aggravating factors: lying on Rt side; lifting; computer; reading  Relieving factors: topical analgesics; heat   PRECAUTIONS: Lt subclavian bybass; AAA bypass   WEIGHT BEARING RESTRICTIONS: No  FALLS:  Has patient fallen in last 6 months? No   OCCUPATION: retired; active with household chores and dancing; reading; some computer work; Rt hand dominate    PATIENT GOALS: get rid of pain and use the arm again; dance again   NEXT MD VISIT: 08/22/22  OBJECTIVE:   DIAGNOSTIC FINDINGS:  MRI 03/04/21 - Mild cervical degenerative disc disease without spinal canal or neural foraminal stenosis.  PATIENT SURVEYS:  FOTO 49 goal 60  SENSATION: Rt UE numbness and tingling glove like intermittently daily lasting ~ 1-2 minutes  08/01/22: less tingling   POSTURE: Patient presents with head forward posture with increased thoracic kyphosis; shoulders rounded and elevated; scapulae abducted and rotated along the thoracic spine; head of the humerus anterior in orientation.   PALPATION: Muscular tightness ant/lat/post cervical musculature; pecs; upper trap; leveator; biceps Rt > Lt   CERVICAL ROM:   Active ROM A/PROM (deg) eval  Flexion 39  Extension 24  Right lateral flexion 29  Left lateral flexion 34  Right rotation 54  Left rotation 53   (Blank rows = not tested)  UPPER EXTREMITY ROM:  Active ROM Right eval Left eval  Shoulder flexion 145 150  Shoulder extension    Shoulder abduction 145 150  Shoulder adduction    Shoulder extension    Shoulder internal rotation Thumb T12 Thumb T7  Shoulder external rotation 75 80  Elbow flexion    Elbow extension    Wrist flexion    Wrist extension    Wrist ulnar deviation    Wrist radial deviation    Wrist pronation    Wrist supination     (Blank rows = not tested)  UPPER  EXTREMITY MMT:  MMT Right eval Left eval  Shoulder flexion    Shoulder extension    Shoulder abduction    Shoulder adduction    Shoulder extension    Shoulder internal rotation    Shoulder external rotation    Middle trapezius 4 4  Lower trapezius    Elbow flexion    Elbow extension    Wrist flexion    Wrist extension    Wrist ulnar deviation    Wrist radial deviation    Wrist pronation    Wrist supination    Grip strength 18 45   (Blank rows = not tested)   TODAY'S TREATMENT:                                                                                                                              [facility] Adult PT Treatment:  DATE: 08/08/22 Therapeutic Exercise: Supine  T UE's out to side ~ 2 min bending elbows to release stretch as needed  Sitting   Chin tuck 5 sec x 5  Scap squeeze 5 sec x 5  Standing   Doorway stretch lower 2 positions 30 sec x 2   Chin tuck 5 sec x 5   Scap squeeze 5 sec x 10   ER elbows at 90 degrees w/noodle x 3 sec x 10   Sidelying  Lt sidelying scap retraction 10-15 reps arm resting on Rt hip  W with noodle x 10  ER/scap squeeze w/ noodle yellow TB x 10  Row green TB 3 sec x 10 x 2 (blue next visit) Bow and arrow green TB 3 sec 10 x 2  Shoulder extension green TB 3 sec x 10 x 2   Manual Therapy: Soft tissue mobilization w/ pt siting -  working through the ant/lat/post cervical and thoracic spine into the Rt posterior scapular area and teres musculature Neuromuscular re-ed: Postural correction and education engaging posterior shoulder girdle musculature Therapeutic Activity: Diaphragmatic breathing count 6 inhale, hold, exhale  Modalities: MH cervical and thoracic spine x 10 min   DATE: 08/06/22 Therapeutic Exercise: Supine  Deep breathing x ~ 2 min  Chin tuck 5 sec x 10  Scap squeeze 5 sec x 10  T UE's out to side ~ 2 min bending elbows to release stretch as needed  Sitting   Chin  tuck 5 sec x 5  Scap squeeze 5 sec x 5  Standing   Doorway stretch lower 2 positions 30 sec x 2   Chin tuck 5 sec x 5   Scap squeeze 5 sec x 10   ER elbows at 90 degrees w/noodle x 3 sec x 10   Sidelying  Lt sidelying scap retraction 10-15 reps arm resting on Rt hip  W with noodle x 10  ER/scap squeeze w/ noodle yellow TB x 10  Row green TB 3 sec x 10 x 2  Bow and arrow green TB 3 sec 10 x 2    Manual Therapy: Soft tissue mobilization w/ pt siting -  working through the ant/lat/post cervical and thoracic spine into the Rt posterior scapular area and teres musculature Neuromuscular re-ed: Postural correction and education engaging posterior shoulder girdle musculature Therapeutic Activity: Diaphragmatic breathing count 6 inhale, hold, exhale  Modalities: MH cervical and thoracic spine x 10 min  Self Care: Sitting with coregeous ball; supporting UE's on pillows for TV/reading  PATIENT EDUCATION:  Education details: POC; HEP  Person educated: Patient Education method: Programmer, multimedia, Demonstration, Tactile cues, Verbal cues, and Handouts Education comprehension: verbalized understanding, returned demonstration, verbal cues required, tactile cues required, and needs further education  HOME EXERCISE PROGRAM: Access Code: [email or web address] URL: [email or web address] Date: 08/06/2022 Prepared by: Corlis Leak  Exercises - Supine Diaphragmatic Breathing  - 2 x daily - 7 x weekly - 1 sets - 10 reps - 4-6 sec  hold - Supine Cervical Retraction with Towel  - 2 x daily - 7 x weekly - 1 sets - 5-10 reps - 10 sec  hold - Supine Scapular Retraction  - 2 x daily - 7 x weekly - 1 sets - 10 reps - 5-10 sec  hold - Supine Thoracic Mobilization Towel Roll Vertical with Arm Stretch  - 2 x daily - 7 x weekly - 1 sets - 1-3 reps - 2-3 min hold - Seated Cervical Retraction  - 3 x daily - 7  x weekly - 1 sets - 10 reps - Standing Scapular Retraction  - 3 x daily - 7 x weekly - 1 sets - 10 reps - 10  hold - Shoulder External Rotation and Scapular Retraction  - 3 x daily - 7 x weekly - 1 sets - 10 reps -   hold - Shoulder External Rotation and Scapular Retraction with Resistance  - 2 x daily - 7 x weekly - 1 sets - 10 reps - 3-5 sec  hold - Shoulder External Rotation in 45 Degrees Abduction  - 2 x daily - 7 x weekly - 1-2 sets - 10 reps - 3 sec  hold - Doorway Pec Stretch at 60 Degrees Abduction  - 3 x daily - 7 x weekly - 1 sets - 3 reps - Doorway Pec Stretch at 90 Degrees Abduction  - 3 x daily - 7 x weekly - 1 sets - 3 reps - 30 seconds  hold - Standing Bilateral Low Shoulder Row with Anchored Resistance  - 2 x daily - 7 x weekly - 1-3 sets - 10 reps - 2-3 sec  hold - Drawing Bow  - 1 x daily - 7 x weekly - 1 sets - 10 reps - 3 sec  hold  Patient Education - Office Posture  ASSESSMENT:  CLINICAL IMPRESSION: Patient reports continued improvement with less pain with decreased radicular tinging Rt UE - tinging is less intense and does not extend to hand. Note continued decreased tightness to palpation with manual work. Reviewed and progressed with exercises in standing including theraband strengthening and gentle stretch for pecs.   OBJECTIVE IMPAIRMENTS: cervical dysfunction with Rt > Lt UE radiculopathy. She has had symptoms in the past 6+ weeks including pain and discomfort with radicular pain in the neck and Rt shoulder; poor posture and alignment; limited cervical and bilat shoulder ROM; Rt UE weakness; limited functional activity level; decreased activity tolerance, decreased mobility, decreased ROM, decreased strength, hypomobility, increased fascial restrictions, impaired flexibility, impaired UE functional use, improper body mechanics, postural dysfunction, and pain.   GOALS: Goals reviewed with patient? Yes  SHORT TERM GOALS: Target date: 08/13/2022  Improve posture and alignment with patient to demonstrate increased upright posture with posterior shoulder girdle  engaged Baseline:  Goal status: on going   2.  Independent in initial HEP  Baseline:  Goal status: met  3.  Patient to demonstrate and report improved positions for sitting activities  Baseline:  Goal status: met  LONG TERM GOALS: Target date: 09/11/2022   Decrease pain in neck and shoulder by 75-100% allowing patient to return to more normal functional activities Baseline:  Goal status: met  2.  Increase AROM cervical spine to WFL's with minimal to no pain  Baseline:  Goal status: met  3.  Increase strength Rt UE grip strength to 40-45#  Baseline:  Goal status: INITIAL  4.  Improve posture and alignment to increase posterior shoulder girdle strength with posterior shoulder girdle musculature engaged  Baseline:  Goal status: INITIAL  5.  Independent in HEP including aquatic program as indicated  Baseline:  Goal status: INITIAL  6.  Improve functional limitation score to 60 Baseline: 08/08/22 = 64 Goal status: Met   PLAN:  PT FREQUENCY: 2x/wk  PT DURATION: 8 weeks  PLANNED INTERVENTIONS: Therapeutic exercises, Therapeutic activity, Neuromuscular re-education, Patient/Family education, Self Care, Joint mobilization, Aquatic Therapy, Dry Needling, Spinal mobilization, Cryotherapy, Moist heat, Taping, Traction, Ultrasound, Ionotophoresis 4mg /ml Dexamethasone, Manual therapy, and Re-evaluation  PLAN FOR  NEXT SESSION: review and progress exercise; postural correction and education; manual work, no DN, modalities as indicated    [employer], PT 08/08/2022, 2:08 PM

## 2022-08-09 ENCOUNTER — Ambulatory Visit (INDEPENDENT_AMBULATORY_CARE_PROVIDER_SITE_OTHER): Payer: Medicare Other | Admitting: Family Medicine

## 2022-08-09 DIAGNOSIS — Z0000 Encounter for general adult medical examination without abnormal findings: Secondary | ICD-10-CM | POA: Diagnosis not present

## 2022-08-09 NOTE — Patient Instructions (Signed)
[facility] Maintenance Summary and Written Plan of Care  Traci [name] ,  Thank you for allowing me to perform your Medicare Annual Wellness Visit and for your ongoing commitment to your health.   Health Maintenance & Immunization History Health Maintenance  Topic Date Due   COVID-19 Vaccine (6 - 2023-24 season) 08/25/2022 (Originally 05/27/2022)   Medicare Annual Wellness (AWV)  08/10/2023   DEXA SCAN  05/08/2024   DTaP/Tdap/Td (4 - Td or Tdap) 03/24/2031   Pneumonia Vaccine 76+ Years old  Completed   INFLUENZA VACCINE  Completed   Hepatitis C Screening  Completed   Zoster Vaccines- Shingrix  Completed   HPV VACCINES  Aged Out   COLONOSCOPY (Pts 45-44yr Insurance coverage will need to be confirmed)  Discontinued   Immunization History  Administered Date(s) Administered   COVID-19, mRNA, vaccine(Comirnaty)12 years and older 04/01/2022   Fluad Quad(high Dose 65+) 03/01/2019, 02/28/2020, 02/28/2021   Influenza, High Dose Seasonal PF 03/25/2018   Influenza, Seasonal, Injecte, Preservative Fre 03/01/2019   Influenza-Unspecified 03/02/2015, 03/15/2016, 02/21/2017, 02/07/2022   Lyme Disease 03/20/2009   PFIZER(Purple Top)SARS-COV-2 Vaccination 07/22/2019, 08/16/2019, 03/15/2020, 12/30/2020   Pfizer Covid-19 Vaccine Bivalent Booster 141yr& up 04/01/2022   Pneumococcal Conjugate-13 08/30/2014   Pneumococcal Polysaccharide-23 10/10/2011, 06/17/2012   Tdap 06/17/2010, 04/08/2011, 03/23/2021   Zoster Recombinat (Shingrix) 03/23/2021, 05/24/2021   Zoster, Live 03/22/2012, 06/17/2012    These are the patient goals that we discussed:  Goals Addressed               This Visit's Progress     Patient Stated (pt-stated)        Patient would like to be able to return to her baseline.          This is a list of Health Maintenance Items that are overdue or due now: There are no preventive care reminders to display for this patient.     Orders/Referrals Placed Today: No orders of the defined types were placed in this encounter.  (Contact our referral department at [phone] you have not spoken with someone about your referral appointment within the next 5 days)    Follow-up Plan Follow-up with MeHali MarryMD as planned Medicare wellness visit in one year.  Patient will access AVS on my chart.     Health Maintenance, Female Adopting a healthy lifestyle and getting preventive care are important in promoting health and wellness. Ask your health care provider about: The right schedule for you to have regular tests and exams. Things you can do on your own to prevent diseases and keep yourself healthy. What should I know about diet, weight, and exercise? Eat a healthy diet  Eat a diet that includes plenty of vegetables, fruits, low-fat dairy products, and lean protein. Do not eat a lot of foods that are high in solid fats, added sugars, or sodium. Maintain a healthy weight Body mass index (BMI) is used to identify weight problems. It estimates body fat based on height and weight. Your health care provider can help determine your BMI and help you achieve or maintain a healthy weight. Get regular exercise Get regular exercise. This is one of the most important things you can do for your health. Most adults should: Exercise for at least 150 minutes each week. The exercise should increase your heart rate and make you sweat (moderate-intensity exercise). Do strengthening exercises at least twice a week. This is in addition to the moderate-intensity exercise. Spend less time sitting.  Even light physical activity can be beneficial. Watch cholesterol and blood lipids Have your blood tested for lipids and cholesterol at 76 years of age, then have this test every 5 years. Have your cholesterol levels checked more often if: Your lipid or cholesterol levels are high. You are older than 76 years of age. You are  at high risk for heart disease. What should I know about cancer screening? Depending on your health history and family history, you may need to have cancer screening at various ages. This may include screening for: Breast cancer. (76 years old) Cervical cancer. Colorectal cancer. Skin cancer. Lung cancer. What should I know about heart disease, diabetes, and high blood pressure? Blood pressure and heart disease High blood pressure causes heart disease and increases the risk of stroke. This is more likely to develop in people who have high blood pressure readings or are overweight. Have your blood pressure checked: Every 3-5 years if you are 2-76 years of age. Every year if you are 19 years old or older. Diabetes Have regular diabetes screenings. This checks your fasting blood sugar level. Have the screening done: Once every three years after age 76 if you are at a normal weight and have a low risk for diabetes. More often and at a younger age if you are overweight or have a high risk for diabetes. What should I know about preventing infection? Hepatitis B If you have a higher risk for hepatitis B, you should be screened for this virus. Talk with your health care provider to find out if you are at risk for hepatitis B infection. Hepatitis C Testing is recommended for: Everyone born from [birth year] through 1964. Anyone with known risk factors for hepatitis C. Sexually transmitted infections (STIs) Get screened for STIs, including gonorrhea and chlamydia, if: You are sexually active and are younger than 76 years of age. You are older than 76 years of age and your health care provider tells you that you are at risk for this type of infection. Your sexual activity has changed since you were last screened, and you are at increased risk for chlamydia or gonorrhea. Ask your health care provider if you are at risk. Ask your health care provider about whether you are at high risk for HIV. Your health care provider  may recommend a prescription medicine to help prevent HIV infection. If you choose to take medicine to prevent HIV, you should first get tested for HIV. You should then be tested every 3 months for as long as you are taking the medicine. Pregnancy If you are about to stop having your period (premenopausal) and you may become pregnant, seek counseling before you get pregnant. Take 400 to 800 micrograms (mcg) of folic acid every day if you become pregnant. Ask for birth control (contraception) if you want to prevent pregnancy. Osteoporosis and menopause Osteoporosis is a disease in which the bones lose minerals and strength with aging. This can result in bone fractures. If you are 35 years old or older, or if you are at risk for osteoporosis and fractures, ask your health care provider if you should: Be screened for bone loss. Take a calcium or vitamin D supplement to lower your risk of fractures. Be given hormone replacement therapy (HRT) to treat symptoms of menopause. Follow these instructions at home: Alcohol use Do not drink alcohol if: Your health care provider tells you not to drink. You are pregnant, may be pregnant, or are planning to become pregnant. If you drink alcohol: Limit how  much you have to: 0-1 drink a day. Know how much alcohol is in your drink. In the [place] one drink equals one 12 oz bottle of beer (355 mL), one 5 oz glass of wine (148 mL), or one 1 oz glass of hard liquor (44 mL). Lifestyle Do not use any products that contain nicotine or tobacco. These products include cigarettes, chewing tobacco, and vaping devices, such as e-cigarettes. If you need help quitting, ask your health care provider. Do not use street drugs. Do not share needles. Ask your health care provider for help if you need support or information about quitting drugs. General instructions Schedule regular health, dental, and eye exams. Stay current with your vaccines. Tell your health care provider  if: You often feel depressed. You have ever been abused or do not feel safe at home. Summary Adopting a healthy lifestyle and getting preventive care are important in promoting health and wellness. Follow your health care provider's instructions about healthy diet, exercising, and getting tested or screened for diseases. Follow your health care provider's instructions on monitoring your cholesterol and blood pressure. This information is not intended to replace advice given to you by your health care provider. Make sure you discuss any questions you have with your health care provider. Document Revised: 10/23/2020 Document Reviewed: 10/23/2020 Elsevier Patient Education  [employer].

## 2022-08-09 NOTE — Progress Notes (Signed)
MEDICARE ANNUAL WELLNESS VISIT  08/09/2022  Telephone Visit Disclaimer This Medicare AWV was conducted by telephone due to national recommendations for restrictions regarding the COVID-19 Pandemic (e.g. social distancing).  I verified, using two identifiers, that I am speaking with Traci [name] or their authorized healthcare agent. I discussed the limitations, risks, security, and privacy concerns of performing an evaluation and management service by telephone and the potential availability of an in-person appointment in the future. The patient expressed understanding and agreed to proceed.  Location of Patient: Home Location of Provider (nurse):  Provider home  Subjective:    Traci [name] is a 77 y.o. female patient of Metheney, Rene Kocher, MD who had a Medicare Annual Wellness Visit today via telephone. Traci [name] is Retired and her son lives with her. she has one child. she reports that she is socially active and does interact with friends/family regularly. she is minimally physically active and enjoys dancing, meeting with friends and playing online games.  Patient Care Team: Hali Marry, MD as PCP - General (Family Medicine) Sanda Klein, MD as PCP - Cardiology (Cardiology) Erline Levine, MD as Consulting Physician (Neurosurgery) Janie Morning, MD (Gastroenterology)     08/09/2022   10:51 AM 07/17/2022    1:50 PM 08/28/2021   10:11 AM 07/20/2021   11:03 AM 02/06/2021    7:04 PM 06/21/2020   11:04 AM 09/09/2019   11:19 AM  Advanced Directives  Does Patient Have a Medical Advance Directive? Yes No Yes Yes Yes Yes Yes  Type of Advance Directive Living [name]  Traci [name]  Does patient want to make changes to medical advance directive? No - Patient  declined  No - Patient declined No - Patient declined  No - Patient declined   Copy of [place] in Chart?   No - copy requested No - copy requested  Yes - validated most recent copy scanned in chart (See row information) No - copy requested  Would patient like information on creating a medical advance directive?  No - Patient declined   No - Patient declined No - Patient declined No - Patient declined    Hospital Utilization Over the Past 12 Months: # of hospitalizations or ER visits: 0 # of surgeries: 0  Review of Systems    Patient reports that her overall health is unchanged compared to last year.  History obtained from chart review and the patient  Patient Reported Readings (BP, Pulse, CBG, Weight, etc) none  Pain Assessment Pain : No/denies pain     Current Medications & Allergies (verified) Allergies as of 08/09/2022       Reactions   Cortisone Anaphylaxis   Dilaudid [hydromorphone Hcl] Nausea And Vomiting   Pt states she [name] start vomiting immediately for 6 hours   Iodine Anaphylaxis   Medrol [methylprednisolone] Anaphylaxis   Omnipaque [iohexol] Anaphylaxis   Prednisone Anaphylaxis, Swelling   Shellfish Allergy Anaphylaxis   Sulfa Drugs Cross Reactors Anaphylaxis   Z-pak [azithromycin] Swelling   Doxycycline    Thrush/itching   Ivp Dye [iodinated Contrast Media]    Other reaction(s): Unknown   Methylprednisolone Sodium Succ Swelling   Blood pressure drops immediately   Sulfa Antibiotics Other (See Comments)   Stomach upset   Augmentin [amoxicillin-pot Clavulanate] Nausea Only   Upset stomach (Has taken cephalexin in the past without  problems)   Codeine Rash, Other (See Comments)   Upset stomach   Erythromycin Rash   Morphine And Related Nausea Only   nausea        Medication List        Accurate as of August 09, 2022 11:08 AM. If you have any questions, ask your nurse or doctor.          aspirin EC 81 MG tablet Take 81  mg by mouth daily.   atenolol 25 MG tablet Commonly known as: TENORMIN TAKE 1 TABLET DAILY   atorvastatin 10 MG tablet Commonly known as: LIPITOR TAKE 1 TABLET DAILY AT 6 P.M.   candesartan 4 MG tablet Commonly known as: ATACAND Take 1 tablet (4 mg total) by mouth in the morning and at bedtime.   cholecalciferol 1000 units tablet Commonly known as: VITAMIN D Take 1,000 Units by mouth daily.   clopidogrel 75 MG tablet Commonly known as: PLAVIX TAKE 1 TABLET DAILY   clorazepate 7.5 MG tablet Commonly known as: TRANXENE TAKE ONE-HALF (1/2) TO ONE TABLET DAILY AS NEEDED   cyanocobalamin 1000 MCG tablet Take 1,000 mcg by mouth daily.   EPINEPHrine 0.3 mg/0.3 mL Soaj injection Commonly known as: EpiPen 2-Pak Inject 0.3 mLs (0.3 mg total) into the muscle as needed (for allergic reaction).   esomeprazole 40 MG capsule Commonly known as: NEXIUM Take 1 capsule (40 mg total) by mouth daily. Take 1 tab daily   estradiol 0.5 MG tablet Commonly known as: ESTRACE TAKE 1 TABLET DAILY   furosemide 20 MG tablet Commonly known as: LASIX TAKE 1 TABLET EVERY OTHER DAY   linaclotide 145 MCG Caps capsule Commonly known as: LINZESS Take 145 mcg by mouth as needed. constipation   meclizine 25 MG tablet Commonly known as: ANTIVERT TAKE ONE-HALF (1/2) TABLET THREE TIMES A DAY AS NEEDED FOR DIZZINESS OR NAUSEA   meloxicam 15 MG tablet Commonly known as: MOBIC One tab PO every 24 hours with a meal for 2 weeks, then once every 24 hours prn pain.   multivitamin with minerals Tabs tablet Take 1 tablet by mouth daily.   predniSONE 20 MG tablet Commonly known as: DELTASONE One tab PO daily for 5 days.   zolpidem 5 MG tablet Commonly known as: AMBIEN Take 1 tablet (5 mg total) by mouth at bedtime as needed for sleep.        History (reviewed): Past Medical History:  Diagnosis Date   Allergy 1995   Contrast dye   Anxiety    Arthritis 2023   Atrial fibrillation (HCC)    CAD  (coronary artery disease)    CKD (chronic kidney disease) stage 3, GFR 30-59 ml/min (HCC) 08/04/2017   Claudication ([place]) 01/06/2008   Lower extremity dopplers - no evidence of arterial insufficiency, normal exam   Coronary artery disease    GERD (gastroesophageal reflux disease)    Heart murmur    Hyperlipidemia    Hypertension 11/30/2010   echo- EF 55%; normal w/ mildly sclerotic aortic valve   Hypertension 11/05/2011   renal dopplers - celiac artery and SMA >50% diameter reduction, R renal artery - mildly elevated velocities 1-59% diameter reduction, L renal artery normal   Nonspecific ST-T wave electrocardiographic changes 03/26/2011   R/Lmv - EF 74%, normal perfusion all regions, ST depression w/ Lexiscan infusion w/o assoc angina   Osteopenia    PAD (peripheral artery disease) ([place])    Peripheral vascular disease ([place])    PVD (peripheral vascular disease) ([place]) 11/05/2011  doppler - R/L brachial pressures essentially equal w/o inflow disease; L sublclavian/CCA bypass graft demonstrates patent flow, no evidence of significant stenosis   Sigmoid diverticulitis    Past Surgical History:  Procedure Laterality Date   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 CFX, RCA OK.   CATARACT EXTRACTION     CHOLECYSTECTOMY     EYE SURGERY     retinal surgery   SMALL INTESTINE SURGERY     SPINE SURGERY  07/2009   SUBCLAVIAN ARTERY STENT  1995   X's  several   SUBCLAVIAN ARTERY STENT Left 09/20/2008   LSA ISR 7x87m Cordis Genesis on Opta premount, reduction from 80% to 0%   subclavian artery stents  01/23/2010   Left carotid to subclavian artery Bypass   VISCERAL ANGIOGRAM   01/17/2014   Procedure: VISCERAL ANGIOGRAM;  Surgeon: CAngelia Mould MD;  [facility];  Service: Cardiovascular;;   Family History  Problem Relation Age of Onset   Heart disease Father        Heart Disease before age 76  Kidney disease Father    Heart attack Father    Hyperlipidemia Father    Hypertension Father    Alcohol abuse Father    Kidney failure Mother    Arthritis Mother    Diabetes Maternal Grandmother    Heart disease Paternal Grandfather    Social History   Socioeconomic History   Marital status: Widowed    Spouse name: Not on file   Number of children: 1   Years of education: 118  Highest education level: Associate degree: academic program  Occupational History   Occupation: retired    Comment: NMarine scientist Tobacco Use   Smoking status: Former    Packs/day: 0.25    Years: 20.00    Total pack years: 5.00    Types: Cigarettes    Quit date: 09/30/1993    Years since quitting: 28.8   Smokeless tobacco: Never  Vaping Use   Vaping Use: Never used  Substance and Sexual Activity   Alcohol use: No   Drug use: No   Sexual activity: Not Currently  Other Topics Concern   Not on file  Social History Narrative   Lives with her son. Likes to go dancing and likes play online games such as scrabble.    Social Determinants of Health   Financial Resource Strain: Low Risk  (08/06/2022)   Overall Financial Resource Strain (CARDIA)    Difficulty of Paying Living Expenses: Not hard at all  Food Insecurity: No Food Insecurity (08/06/2022)   Hunger Vital Sign    Worried About Running Out of Food in the Last Year: Never true    Ran Out of Food in the Last Year: Never true  Transportation Needs: No Transportation Needs (08/06/2022)   PRAPARE - THydrologist(Medical): No    Lack of Transportation (Non-Medical): No  Physical Activity: Insufficiently Active (08/06/2022)   Exercise Vital Sign    Days of Exercise per Week: 2 days     Minutes of Exercise per Session: 30 min  Stress: No Stress Concern Present (08/06/2022)   [place]  Occupational Stress Questionnaire    Feeling of Stress : Only a little  Social Connections: Moderately Isolated (08/09/2022)   Social Connection and Isolation Panel [NHANES]    Frequency of Communication with Friends and Family: Three times a week    Frequency of Social Gatherings with Friends and Family: Twice a week    Attends Religious Services: Never    Marine scientist or Organizations: No    Attends Music therapist: 1 to 4 times per year    Marital Status: Widowed    Activities of Daily Living    08/06/2022    8:33 PM  In your present state of health, do you have any difficulty performing the following activities:  Hearing? 0  Vision? 0  Difficulty concentrating or making decisions? 0  Walking or climbing stairs? 0  Dressing or bathing? 0  Doing errands, shopping? 0  Preparing Food and eating ? N  Using the Toilet? N  In the past six months, have you accidently leaked urine? Y  Do you have problems with loss of bowel control? N  Managing your Medications? N  Managing your Finances? N  Housekeeping or managing your Housekeeping? N    Patient Education/ Literacy How often do you need to have someone help you when you read instructions, pamphlets, or other written materials from your doctor or pharmacy?: 1 - Never What is the last grade level you completed in school?: Bachelor's degree  Exercise Current Exercise Habits: Home exercise routine, Type of exercise: Other - see comments (PT), Time (Minutes): 30, Frequency (Times/Week): 2, Weekly Exercise (Minutes/Week): 60, Intensity: Moderate, Exercise limited by: orthopedic condition(s)  Diet Patient reports consuming 3 meals a day and 0 snack(s) a day Patient reports that her primary diet is: Regular Patient reports that she does have regular access to food.   Depression  Screen    08/09/2022   10:51 AM 02/28/2022   10:40 AM 08/28/2021   10:11 AM 07/20/2021   11:05 AM 07/02/2021    1:34 PM 01/08/2021   10:51 AM 06/21/2020   11:11 AM  PHQ 2/9 Scores  PHQ - 2 Score 0 0 1 0 0 0 0  PHQ- 9 Score       0     Fall Risk    08/09/2022   10:52 AM 08/06/2022    8:33 PM 02/28/2022   10:39 AM 08/28/2021   10:10 AM 07/20/2021   11:05 AM  [place] in the past year? 0 0 0 0 0  Number falls in past yr: 0 0 0 0 0  Injury with Fall? 0 0 0 0 0  Risk for fall due to : No Fall Risks  No Fall Risks No Fall Risks No Fall Risks  Follow up Falls evaluation completed  Falls evaluation completed Falls prevention discussed Falls evaluation completed     Objective:  Aiyanah A [place] seemed alert and oriented and she participated appropriately during our telephone visit.  Blood Pressure Weight BMI  BP Readings from Last 3 Encounters:  05/02/22 (!) 145/70  04/24/22 (!) 137/49  02/28/22 124/64   Wt Readings from Last 3 Encounters:  05/02/22 151 lb 6.4 oz (68.7 kg)  04/24/22 148 lb 8 oz (67.4 kg)  02/28/22 147 lb (66.7 kg)   BMI Readings from Last 1 Encounters:  05/02/22 25.99 kg/m    *Unable to obtain current vital signs, weight, and BMI due to telephone visit type  Hearing/Vision  Traci [name] did  not seem to have difficulty with hearing/understanding during the telephone conversation Reports that she has had a formal eye exam by an eye care professional within the past year Reports that she has not had a formal hearing evaluation within the past year *Unable to fully assess hearing and vision during telephone visit type  Cognitive Function:    08/09/2022   11:03 AM 07/20/2021   11:10 AM 06/21/2020   11:08 AM 06/21/2019   11:07 AM 06/15/2018   11:18 AM  6CIT Screen  What Year? 0 points 0 points 0 points 0 points 0 points  What month? 0 points 0 points 0 points 0 points 0 points  What time? 0 points 0 points 0 points 0 points 0 points  Count back from 20 0  points 0 points 0 points 0 points 0 points  Months in reverse 0 points 0 points 0 points 0 points 0 points  Repeat phrase 0 points 0 points 0 points 0 points 2 points  Total Score 0 points 0 points 0 points 0 points 2 points   (Normal:0-7, Significant for Dysfunction: >8)  Normal Cognitive Function Screening: Yes   Immunization & Health Maintenance Record Immunization History  Administered Date(s) Administered   COVID-19, mRNA, vaccine(Comirnaty)12 years and older 04/01/2022   Fluad Quad(high Dose 65+) 03/01/2019, 02/28/2020, 02/28/2021   Influenza, High Dose Seasonal PF 03/25/2018   Influenza, Seasonal, Injecte, Preservative Fre 03/01/2019   Influenza-Unspecified 03/02/2015, 03/15/2016, 02/21/2017, 02/07/2022   Lyme Disease 03/20/2009   PFIZER(Purple Top)SARS-COV-2 Vaccination 07/22/2019, 08/16/2019, 03/15/2020, 12/30/2020   Pfizer Covid-19 Vaccine Bivalent Booster 41yr & up 04/01/2022   Pneumococcal Conjugate-13 08/30/2014   Pneumococcal Polysaccharide-23 10/10/2011, 06/17/2012   Tdap 06/17/2010, 04/08/2011, 03/23/2021   Zoster Recombinat (Shingrix) 03/23/2021, 05/24/2021   Zoster, Live 03/22/2012, 06/17/2012    Health Maintenance  Topic Date Due   Medicare Annual Wellness (AWV)  07/20/2022   COVID-19 Vaccine (6 - 2023-24 season) 08/25/2022 (Originally 05/27/2022)   DEXA SCAN  05/08/2024   DTaP/Tdap/Td (4 - Td or Tdap) 03/24/2031   Pneumonia Vaccine 76 Years old  Completed   INFLUENZA VACCINE  Completed   Hepatitis C Screening  Completed   Zoster Vaccines- Shingrix  Completed   HPV VACCINES  Aged Out   COLONOSCOPY (Pts 45-44yrInsurance coverage [name] need to be confirmed)  Discontinued       Assessment  This is a routine wellness examination for [employer] Health Maintenance: Due or Overdue Health Maintenance Due  Topic Date Due   Medicare Annual Wellness (AWV)  07/20/2022    [place] does not need a referral for CoCommercial Metals CompanyAssistance: Care Management:   no Social Work:    no Prescription Assistance:  no Nutrition/Diabetes Education:  no   Plan:  Personalized Goals  Goals Addressed               This Visit's Progress     Patient Stated (pt-stated)        Patient would like to be able to return to her baseline.        Personalized Health Maintenance & Screening Recommendations  There are no preventive care reminders to display for this patient.  Lung Cancer Screening Recommended: no (Low Dose CT Chest recommended if Age 76-80ears, 30 pack-year currently smoking OR have quit w/in past 15 years) Hepatitis C Screening recommended: no HIV Screening recommended: no  Advanced Directives: Written information was not prepared per patient's request.  Referrals & Orders No orders of  the defined types were placed in this encounter.   Follow-up Plan Follow-up with Hali Marry, MD as planned Medicare wellness visit in one year.  Patient [name] access AVS on my chart.   I have personally reviewed and noted the following in the patient's chart:   Medical and social history Use of alcohol, tobacco or illicit drugs  Current medications and supplements Functional ability and status Nutritional status Physical activity Advanced directives List of other physicians Hospitalizations, surgeries, and ER visits in previous 12 months Vitals Screenings to include cognitive, depression, and falls Referrals and appointments  In addition, I have reviewed and discussed with Traci [name] certain preventive protocols, quality metrics, and best practice recommendations. A written personalized care plan for preventive services as well as general preventive health recommendations is available and can be mailed to the patient at her request.      Tinnie Gens, RN BSN  08/09/2022

## 2022-08-11 DIAGNOSIS — Z9049 Acquired absence of other specified parts of digestive tract: Secondary | ICD-10-CM | POA: Diagnosis not present

## 2022-08-11 DIAGNOSIS — Z885 Allergy status to narcotic agent status: Secondary | ICD-10-CM | POA: Diagnosis not present

## 2022-08-11 DIAGNOSIS — Z7902 Long term (current) use of antithrombotics/antiplatelets: Secondary | ICD-10-CM | POA: Diagnosis not present

## 2022-08-11 DIAGNOSIS — K551 Chronic vascular disorders of intestine: Secondary | ICD-10-CM | POA: Diagnosis not present

## 2022-08-11 DIAGNOSIS — Z883 Allergy status to other anti-infective agents status: Secondary | ICD-10-CM | POA: Diagnosis not present

## 2022-08-11 DIAGNOSIS — Z91013 Allergy to seafood: Secondary | ICD-10-CM | POA: Diagnosis not present

## 2022-08-11 DIAGNOSIS — Z888 Allergy status to other drugs, medicaments and biological substances status: Secondary | ICD-10-CM | POA: Diagnosis not present

## 2022-08-11 DIAGNOSIS — Z882 Allergy status to sulfonamides status: Secondary | ICD-10-CM | POA: Diagnosis not present

## 2022-08-11 DIAGNOSIS — K5900 Constipation, unspecified: Secondary | ICD-10-CM | POA: Diagnosis not present

## 2022-08-11 DIAGNOSIS — I2510 Atherosclerotic heart disease of native coronary artery without angina pectoris: Secondary | ICD-10-CM | POA: Diagnosis not present

## 2022-08-11 DIAGNOSIS — K449 Diaphragmatic hernia without obstruction or gangrene: Secondary | ICD-10-CM | POA: Diagnosis not present

## 2022-08-11 DIAGNOSIS — Z87891 Personal history of nicotine dependence: Secondary | ICD-10-CM | POA: Diagnosis not present

## 2022-08-11 DIAGNOSIS — Z8616 Personal history of COVID-19: Secondary | ICD-10-CM | POA: Diagnosis not present

## 2022-08-11 DIAGNOSIS — I10 Essential (primary) hypertension: Secondary | ICD-10-CM | POA: Diagnosis not present

## 2022-08-11 DIAGNOSIS — I4891 Unspecified atrial fibrillation: Secondary | ICD-10-CM | POA: Diagnosis not present

## 2022-08-11 DIAGNOSIS — E785 Hyperlipidemia, unspecified: Secondary | ICD-10-CM | POA: Diagnosis not present

## 2022-08-11 DIAGNOSIS — K559 Vascular disorder of intestine, unspecified: Secondary | ICD-10-CM | POA: Diagnosis not present

## 2022-08-11 DIAGNOSIS — R10817 Generalized abdominal tenderness: Secondary | ICD-10-CM | POA: Diagnosis not present

## 2022-08-11 DIAGNOSIS — R1084 Generalized abdominal pain: Secondary | ICD-10-CM | POA: Diagnosis not present

## 2022-08-11 DIAGNOSIS — Z79899 Other long term (current) drug therapy: Secondary | ICD-10-CM | POA: Diagnosis not present

## 2022-08-11 DIAGNOSIS — Z7982 Long term (current) use of aspirin: Secondary | ICD-10-CM | POA: Diagnosis not present

## 2022-08-11 DIAGNOSIS — Z90710 Acquired absence of both cervix and uterus: Secondary | ICD-10-CM | POA: Diagnosis not present

## 2022-08-11 DIAGNOSIS — I739 Peripheral vascular disease, unspecified: Secondary | ICD-10-CM | POA: Diagnosis not present

## 2022-08-11 DIAGNOSIS — Z880 Allergy status to penicillin: Secondary | ICD-10-CM | POA: Diagnosis not present

## 2022-08-13 ENCOUNTER — Encounter: Payer: Self-pay | Admitting: Rehabilitative and Restorative Service Providers"

## 2022-08-14 ENCOUNTER — Emergency Department (HOSPITAL_COMMUNITY): Payer: Medicare Other

## 2022-08-14 ENCOUNTER — Ambulatory Visit: Payer: Medicare Other | Admitting: Family Medicine

## 2022-08-14 ENCOUNTER — Other Ambulatory Visit: Payer: Self-pay

## 2022-08-14 ENCOUNTER — Emergency Department (HOSPITAL_COMMUNITY)
Admission: EM | Admit: 2022-08-14 | Discharge: 2022-08-14 | Disposition: A | Discharge status: Home / Self Care | Payer: Medicare Other | Attending: Emergency Medicine | Admitting: Emergency Medicine

## 2022-08-14 ENCOUNTER — Encounter (HOSPITAL_COMMUNITY): Payer: Self-pay | Admitting: Emergency Medicine

## 2022-08-14 DIAGNOSIS — I10 Essential (primary) hypertension: Secondary | ICD-10-CM | POA: Insufficient documentation

## 2022-08-14 DIAGNOSIS — Z79899 Other long term (current) drug therapy: Secondary | ICD-10-CM | POA: Insufficient documentation

## 2022-08-14 DIAGNOSIS — I160 Hypertensive urgency: Secondary | ICD-10-CM | POA: Diagnosis not present

## 2022-08-14 DIAGNOSIS — R140 Abdominal distension (gaseous): Secondary | ICD-10-CM | POA: Diagnosis not present

## 2022-08-14 DIAGNOSIS — R1010 Upper abdominal pain, unspecified: Secondary | ICD-10-CM | POA: Diagnosis not present

## 2022-08-14 DIAGNOSIS — R1084 Generalized abdominal pain: Secondary | ICD-10-CM | POA: Diagnosis not present

## 2022-08-14 DIAGNOSIS — Z7902 Long term (current) use of antithrombotics/antiplatelets: Secondary | ICD-10-CM | POA: Diagnosis not present

## 2022-08-14 DIAGNOSIS — Z7982 Long term (current) use of aspirin: Secondary | ICD-10-CM | POA: Insufficient documentation

## 2022-08-14 DIAGNOSIS — R109 Unspecified abdominal pain: Secondary | ICD-10-CM | POA: Diagnosis not present

## 2022-08-14 LAB — URINALYSIS, ROUTINE W REFLEX MICROSCOPIC
Bacteria, UA: NONE SEEN
Bilirubin Urine: NEGATIVE
Glucose, UA: NEGATIVE mg/dL
Hgb urine dipstick: NEGATIVE
Ketones, ur: NEGATIVE mg/dL
Nitrite: NEGATIVE
Protein, ur: NEGATIVE mg/dL
Specific Gravity, Urine: 1.006 (ref 1.005–1.030)
pH: 6 (ref 5.0–8.0)

## 2022-08-14 LAB — COMPREHENSIVE METABOLIC PANEL
ALT: 18 U/L (ref 0–44)
AST: 30 U/L (ref 15–41)
Albumin: 4 g/dL (ref 3.5–5.0)
Alkaline Phosphatase: 62 U/L (ref 38–126)
Anion gap: 11 (ref 5–15)
BUN: 7 mg/dL — ABNORMAL LOW (ref 8–23)
CO2: 25 mmol/L (ref 22–32)
Calcium: 9 mg/dL (ref 8.9–10.3)
Chloride: 99 mmol/L (ref 98–111)
Creatinine, Ser: 0.95 mg/dL (ref 0.44–1.00)
GFR, Estimated: >60 mL/min (ref >60)
Glucose, Bld: 105 mg/dL — ABNORMAL HIGH (ref 70–99)
Potassium: 3.9 mmol/L (ref 3.5–5.1)
Sodium: 135 mmol/L (ref 135–145)
Total Bilirubin: 0.9 mg/dL (ref 0.3–1.2)
Total Protein: 7.8 g/dL (ref 6.5–8.1)

## 2022-08-14 LAB — CBC WITH DIFFERENTIAL/PLATELET
Abs Immature Granulocytes: 0 10*3/uL (ref 0.00–0.07)
Basophils Absolute: 0 10*3/uL (ref 0.0–0.1)
Basophils Relative: 1 %
Eosinophils Absolute: 0.1 10*3/uL (ref 0.0–0.5)
Eosinophils Relative: 2 %
HCT: 38.6 % (ref 36.0–46.0)
Hemoglobin: 12.7 g/dL (ref 12.0–15.0)
Immature Granulocytes: 0 %
Lymphocytes Relative: 31 %
Lymphs Abs: 1.2 10*3/uL (ref 0.7–4.0)
MCH: 29 pg (ref 26.0–34.0)
MCHC: 32.9 g/dL (ref 30.0–36.0)
MCV: 88.1 fL (ref 80.0–100.0)
Monocytes Absolute: 0.2 10*3/uL (ref 0.1–1.0)
Monocytes Relative: 6 %
Neutro Abs: 2.4 10*3/uL (ref 1.7–7.7)
Neutrophils Relative %: 60 %
Platelets: 324 10*3/uL (ref 150–400)
RBC: 4.38 MIL/uL (ref 3.87–5.11)
RDW: 11.7 % (ref 11.5–15.5)
WBC: 3.9 10*3/uL — ABNORMAL LOW (ref 4.0–10.5)
nRBC: 0 % (ref 0.0–0.2)

## 2022-08-14 LAB — LACTIC ACID, PLASMA: Lactic Acid, Venous: 1.3 mmol/L (ref 0.5–1.9)

## 2022-08-14 LAB — LIPASE, BLOOD: Lipase: 37 U/L (ref 11–51)

## 2022-08-14 MED ORDER — ONDANSETRON HCL 4 MG/2ML IJ SOLN
4.0000 mg | Freq: Once | INTRAMUSCULAR | Status: AC
  Administered 2022-08-14: 4 mg via INTRAVENOUS
  Filled 2022-08-14: qty 2

## 2022-08-14 MED ORDER — ATENOLOL 50 MG PO TABS: 50.0000 mg | ORAL_TABLET | Freq: Every day | ORAL | 0 refills | Status: DC

## 2022-08-14 MED ORDER — SODIUM CHLORIDE 0.9 % IV SOLN: INTRAVENOUS | Status: DC

## 2022-08-14 MED ORDER — FENTANYL CITRATE PF 50 MCG/ML IJ SOSY
50.0000 ug | PREFILLED_SYRINGE | Freq: Once | INTRAMUSCULAR | Status: AC
  Administered 2022-08-14: 50 ug via INTRAVENOUS
  Filled 2022-08-14: qty 1

## 2022-08-14 MED ORDER — GADOBUTROL 1 MMOL/ML IV SOLN
7.0000 mL | Freq: Once | INTRAVENOUS | Status: AC | PRN
  Administered 2022-08-14: 7 mL via INTRAVENOUS

## 2022-08-14 NOTE — Discharge Instructions (Addendum)
When you follow-up with your physicians, please discuss today's visit, your MRI and ultrasound results as well as your increased blood pressure medication regimen.    Return here for concerning changes in your condition.    The blood pressure medication change is a doubling of your atenolol.  Please use 50 mg daily and discussed this with your physician.

## 2022-08-14 NOTE — ED Notes (Signed)
Pt in MRI.

## 2022-08-14 NOTE — ED Triage Notes (Signed)
PT BIB [facility] for Abd pain and Nausea since Friday night.  She went to [employer] on Sat with no relief. PT is dehydrated with dry mouth.  No BM since Sunday. Hx of mesenteric bypass in 2015 p[er EMS. Hypertensive.  238/88 97% HR 60

## 2022-08-14 NOTE — ED Provider Notes (Signed)
[facility] Provider Note   CSN: [id number] Arrival date & time: 08/14/22  [id number]     History  Chief Complaint  Patient presents with   Abdominal Pain    Traci [name] is a 77 y.o. female.  HPI Presents via EMS with concern for abdominal pain, nausea, vomiting.  Onset was about 4 days ago, and she initially went to urgent care.  There she reportedly had CT without contrast that was nondiagnostic.  She notes that since that time she has had persistent symptoms, with no capacity for oral intake of nutrition.  She was able to take her antihypertensives this morning, however. Patient's history is notable for hypertension, prior mesenteric stent as well. EMS reports the patient was awake, alert, but hypertensive en route, systolic greater than [id number]. She reportedly had symmetric blood pressures bilaterally.  EMS ECG sinus rhythm, rate 62, unremarkable.    Home Medications Prior to Admission medications   Medication Sig Start Date End Date Taking? Authorizing Provider  aspirin EC 81 MG tablet Take 81 mg by mouth daily.    [provider]  atenolol (TENORMIN) 50 MG tablet Take 1 tablet (50 mg total) by mouth daily. 08/14/22   Carmin Muskrat, MD  atorvastatin (LIPITOR) 10 MG tablet TAKE 1 TABLET DAILY AT 6 P.M. 07/01/22   Croitoru, Dani Gobble, MD  candesartan (ATACAND) 4 MG tablet Take 1 tablet (4 mg total) by mouth in the morning and at bedtime. 05/22/22   Swinyer, Lanice Schwab, NP  cholecalciferol (VITAMIN D) 1000 UNITS tablet Take 1,000 Units by mouth daily.    [provider]  clopidogrel (PLAVIX) 75 MG tablet TAKE 1 TABLET DAILY 07/01/22   Croitoru, Mihai, MD  clorazepate (TRANXENE) 7.5 MG tablet TAKE ONE-HALF (1/2) TO ONE TABLET DAILY AS NEEDED 07/12/22   Hali Marry, MD  cyanocobalamin 1000 MCG tablet Take 1,000 mcg by mouth daily.    [provider]  EPINEPHrine (EPIPEN 2-PAK) 0.3 mg/0.3 mL IJ SOAJ  injection Inject 0.3 mLs (0.3 mg total) into the muscle as needed (for allergic reaction). 12/23/17   Gregor Hams, MD  esomeprazole (NEXIUM) 40 MG capsule Take 1 capsule (40 mg total) by mouth daily. Take 1 tab daily 02/23/15   Croitoru, Mihai, MD  estradiol (ESTRACE) 0.5 MG tablet TAKE 1 TABLET DAILY 04/12/22   Hali Marry, MD  furosemide (LASIX) 20 MG tablet TAKE 1 TABLET EVERY OTHER DAY 10/23/21   Croitoru, Mihai, MD  linaclotide (LINZESS) 145 MCG CAPS capsule Take 145 mcg by mouth as needed. constipation 12/22/15   [provider]  meclizine (ANTIVERT) 25 MG tablet TAKE ONE-HALF (1/2) TABLET THREE TIMES A DAY AS NEEDED FOR DIZZINESS OR NAUSEA 04/15/22   Hali Marry, MD  meloxicam (MOBIC) 15 MG tablet One tab PO every 24 hours with a meal for 2 weeks, then once every 24 hours prn pain. 02/20/22   Silverio Decamp, MD  Multiple Vitamin (MULTIVITAMIN WITH MINERALS) TABS Take 1 tablet by mouth daily.    [provider]  predniSONE (DELTASONE) 20 MG tablet One tab PO daily for 5 days. 06/21/22   Silverio Decamp, MD  zolpidem (AMBIEN) 5 MG tablet Take 1 tablet (5 mg total) by mouth at bedtime as needed for sleep. 01/28/22   Hali Marry, MD      Allergies    Cortisone, Dilaudid [hydromorphone hcl], Iodine, Medrol [methylprednisolone], Omnipaque [iohexol], Prednisone, Shellfish allergy, Sulfa drugs cross reactors, Z-pak [  azithromycin], Doxycycline, Ivp dye [iodinated contrast media], Methylprednisolone sodium succ, Sulfa antibiotics, Augmentin [amoxicillin-pot clavulanate], Codeine, Erythromycin, and Morphine and related    Review of Systems   Review of Systems  All other systems reviewed and are negative.   Physical Exam Updated Vital Signs BP (!) 161/56   Pulse (!) 54   Temp 98 F (36.7 C) (Oral)   Resp 15   Ht '5\' 4"'$  (1.626 m)   Wt 67.6 kg   SpO2 96%   BMI 25.58 kg/m  Physical Exam Vitals and nursing note reviewed.  Constitutional:       General: She is not in acute distress.    Appearance: She is well-developed.     Comments: Uncomfortable appearing, but not in distress elderly female  HENT:     Head: Normocephalic and atraumatic.  Eyes:     Conjunctiva/sclera: Conjunctivae normal.  Cardiovascular:     Rate and Rhythm: Normal rate and regular rhythm.  Pulmonary:     Effort: Pulmonary effort is normal. No respiratory distress.     Breath sounds: Normal breath sounds. No stridor.  Abdominal:     General: There is no distension.     Tenderness: There is abdominal tenderness. There is guarding.     Comments: Midline abdominal incision visible  Skin:    General: Skin is warm and dry.  Neurological:     Mental Status: She is alert and oriented to person, place, and time.     Cranial Nerves: No cranial nerve deficit.  Psychiatric:        Mood and Affect: Mood normal.     ED Results / Procedures / Treatments   Labs (all labs ordered are listed, but only abnormal results are displayed) Labs Reviewed  COMPREHENSIVE METABOLIC PANEL - Abnormal; Notable for the following components:      Result Value   Glucose, Bld 105 (*)    BUN 7 (*)    All other components within normal limits  CBC WITH DIFFERENTIAL/PLATELET - Abnormal; Notable for the following components:   WBC 3.9 (*)    All other components within normal limits  URINALYSIS, ROUTINE W REFLEX MICROSCOPIC - Abnormal; Notable for the following components:   Color, Urine STRAW (*)    Leukocytes,Ua TRACE (*)    All other components within normal limits  LIPASE, BLOOD  LACTIC ACID, PLASMA  LACTIC ACID, PLASMA    EKG EKG Interpretation  Date/Time:  Wednesday August 14 2022 09:58:36 EST Ventricular Rate:  66 PR Interval:  54 QRS Duration: 108 QT Interval:  429 QTC Calculation: 450 R Axis:   50 Text Interpretation: Sinus rhythm Short PR interval Probable anteroseptal infarct, old Artifact in lead(s) I III aVR aVL aVF V1 Abnormal ECG Confirmed by  Carmin Muskrat [phone]) on 08/14/2022 10:22:57 AM  Radiology MR ANGIO ABDOMEN W WO CONTRAST  Result Date: 08/14/2022 CLINICAL DATA:  Abdominal pain, possible aortic aneurysm. History of aorto SMA bypass graft. EXAM: MRA ABDOMEN AND PELVIS WITH CONTRAST TECHNIQUE: Multiplanar, multiecho pulse sequences of the abdomen and pelvis were obtained with intravenous contrast. Angiographic images of abdomen and pelvis were obtained using MRA technique with intravenous contrast. CONTRAST:  44m GADAVIST GADOBUTROL 1 MMOL/ML IV SOLN COMPARISON:  None Available. FINDINGS: ARTERIAL Aorta: Mild atheromatous irregularity. No aneurysm, dissection, or stenosis. Celiac axis: Patent without dissection, aneurysm, or stenosis. Superior mesenteric: Short-segment occlusion just beyond its origin. Patent graft from the ventral infrarenal aorta to the proximal SMA, unremarkable distal branch anatomy. Left renal:  Duplicated, inferior dominant, both patent. Right renal:          Single, widely patent. Inferior mesenteric:  Single, widely patent. Left iliac: Patent without aneurysm, dissection, or stenosis. Right iliac: Patent without aneurysm, dissection, or stenosis. VENOUS Patent hepatic veins, portal vein, SM V, bilateral renal veins, iliac venous confluence and IVC. NONVASCULAR Lower chest:  No acute findings. Hepatobiliary: No masses or other significant abnormality. Pancreas: No mass, inflammatory changes, or other significant abnormality. Spleen: Within normal limits in size and appearance. Adrenals/Urinary Tract: No masses identified. No evidence of hydronephrosis. Stomach/Bowel: No evidence of obstruction, inflammatory process, or abnormal fluid collections. Limited assessment of bowel loops in the pelvis. Lymphatic: No pathologically enlarged lymph nodes. Reproductive: Post hysterectomy.  No adnexal mass identified. Other: No ascites. Musculoskeletal: No suspicious bone lesions identified. IMPRESSION: 1. No evidence of  abdominal aortic aneurysm, dissection, or stenosis. 2. 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 Patent graft from the ventral infrarenal aorta to the proximal SMA, unremarkable distal branch anatomy. Electronically Signed   By: Lucrezia Europe M.D.   On: 08/14/2022 16:38   US AORTA DUPLEX LIMITED  Result Date: 08/14/2022 CLINICAL DATA:  Left upper quadrant pain for 5 days. History of a aorto SMA bypass graft EXAM: ULTRASOUND OF ABDOMINAL AORTA TECHNIQUE: Ultrasound examination of the abdominal aorta and proximal common iliac arteries was performed to evaluate for aneurysm. Additional color and Doppler images of the distal aorta were obtained to document patency. COMPARISON:  MRI 02/06/2021 FINDINGS: Abdominal aortic measurements as follows: Proximal:  2.3 cm Mid:  1.5 cm Distal:  0.5 cm Patent: Yes, peak systolic velocity is 123456 cm/s Right common iliac artery: 0.5 cm Left common iliac artery: 0.7 cm Of note bowel gas obscures the details of the upper abdominal aorta in the area of known graft. If needed follow-up MRI or CT could be considered IMPRESSION: No evidence of abdominal aortic aneurysm.  Aorta is grossly patent. Some limitation in the evaluation of the upper abdomen and branch vessels including the known bypass graft. Additional cross-sectional imaging study as clinically indicated Electronically Signed   By: Jill Side M.D.   On: 08/14/2022 11:50    Procedures Procedures    Medications Ordered in ED Medications  0.9 %  sodium chloride infusion ( Intravenous New Bag/Given 08/14/22 1022)  fentaNYL (SUBLIMAZE) injection 50 mcg (50 mcg Intravenous Given 08/14/22 1029)  ondansetron (ZOFRAN) injection 4 mg (4 mg Intravenous Given 08/14/22 1022)  gadobutrol (GADAVIST) 1 MMOL/ML injection 7 mL (7 mLs Intravenous Contrast Given 08/14/22 1558)    ED Course/ Medical Decision Making/ A&P                             Medical Decision Making Patient with multiple  medical problems including mesenteric ischemia, hypertension, prior abdominal surgery presents with persistent nausea, vomiting, minimal p.o. tolerance and this found initially to have hypertensive emergency.  Differential including aortic disruption, mesenteric ischemia, stent occlusion, bowel obstruction all considered.  Patient has notable allergies, and not a candidate for stat CT with contrast.  Patient had MRI, ultrasound, labs, IV hydralazine ordered.  IV fentanyl, Zofran ordered as well.  Patient placed on continuous cardiac monitoring, pulse oximetry.  Cardiac 70 sinus normal Pulse ox 99% room air normal   Amount and/or Complexity of Data Reviewed Independent Historian: EMS    Details: As above External Data Reviewed: notes.    Details: Urgent care notes  from last week and reviewed Labs: ordered. Decision-making details documented in ED Course. Radiology: ordered and independent interpretation performed. Decision-making details documented in ED Course. ECG/medicine tests: ordered and independent interpretation performed. Decision-making details documented in ED Course.  Risk Prescription drug management.   5:01 PM Patient awake, alert, and in no distress, speaking clearly.  Blood pressure now 160/60.  Symptoms have improved markedly.  I reviewed her labs, discussed them with her, discussed her MRI and ultrasound results.  No evidence for arterial occlusion, nor aortic disruption.  Some suspicion for the patient's hypertensive urgency contributing to her presentation, and she will have increased blood pressure control on discharge, will discuss this with her physician visit which is scheduled within the next 2 weeks.  She was also follow-up with GI.  No evidence for bacteremia, sepsis, mesenteric ischemia, patient discharged in stable condition.        Final Clinical Impression(s) / ED Diagnoses Final diagnoses:  Generalized abdominal pain  Hypertensive urgency    Rx / DC  Orders ED Discharge Orders          Ordered    atenolol (TENORMIN) 50 MG tablet  Daily        08/14/22 1700              Carmin Muskrat, MD 08/14/22 1701

## 2022-08-15 ENCOUNTER — Telehealth: Payer: Self-pay | Admitting: Cardiovascular Disease

## 2022-08-15 NOTE — Telephone Encounter (Signed)
Pt stated she was seen in the ED on 2/28 and some of her medications were changed. She will like to discuss her medications with MD or RN. Will forward to MD and nurse.

## 2022-08-15 NOTE — Telephone Encounter (Signed)
Patient was discharge from the ER and her medication got switch. Want to speak to the dr, to make sure its okay for her. Please advise

## 2022-08-19 ENCOUNTER — Other Ambulatory Visit: Payer: Self-pay | Admitting: Cardiovascular Disease

## 2022-08-19 NOTE — Telephone Encounter (Signed)
Called back left message 

## 2022-08-20 ENCOUNTER — Telehealth: Payer: Self-pay | Admitting: Cardiovascular Disease

## 2022-08-20 ENCOUNTER — Other Ambulatory Visit: Payer: Self-pay

## 2022-08-20 DIAGNOSIS — K551 Chronic vascular disorders of intestine: Secondary | ICD-10-CM

## 2022-08-20 NOTE — Telephone Encounter (Signed)
I spoke to her

## 2022-08-20 NOTE — Telephone Encounter (Signed)
Called, no answer, left message to call back to give [place] updated medication list.

## 2022-08-20 NOTE — Telephone Encounter (Signed)
Patient called to report she spoke with Dr. Sallyanne Kuster yesterday, her concerns were addressed and she does not need a call back.

## 2022-08-21 NOTE — Telephone Encounter (Signed)
Please see other phone message string from Dr. Victorino December office.

## 2022-08-22 DIAGNOSIS — M5412 Radiculopathy, cervical region: Secondary | ICD-10-CM | POA: Diagnosis not present

## 2022-08-29 ENCOUNTER — Ambulatory Visit (INDEPENDENT_AMBULATORY_CARE_PROVIDER_SITE_OTHER): Payer: Medicare Other | Admitting: Family Medicine

## 2022-08-29 ENCOUNTER — Encounter: Payer: Self-pay | Admitting: Family Medicine

## 2022-08-29 VITALS — BP 149/55 | HR 53 | Ht 64.0 in | Wt 148.0 lb

## 2022-08-29 DIAGNOSIS — R790 Abnormal level of blood mineral: Secondary | ICD-10-CM | POA: Diagnosis not present

## 2022-08-29 DIAGNOSIS — N1831 Chronic kidney disease, stage 3a: Secondary | ICD-10-CM

## 2022-08-29 DIAGNOSIS — I10 Essential (primary) hypertension: Secondary | ICD-10-CM | POA: Diagnosis not present

## 2022-08-29 DIAGNOSIS — G4700 Insomnia, unspecified: Secondary | ICD-10-CM | POA: Diagnosis not present

## 2022-08-29 DIAGNOSIS — E559 Vitamin D deficiency, unspecified: Secondary | ICD-10-CM

## 2022-08-29 DIAGNOSIS — I700 Atherosclerosis of aorta: Secondary | ICD-10-CM | POA: Diagnosis not present

## 2022-08-29 DIAGNOSIS — R7309 Other abnormal glucose: Secondary | ICD-10-CM

## 2022-08-29 MED ORDER — ZOLPIDEM TARTRATE 5 MG PO TABS: 5.0000 mg | ORAL_TABLET | Freq: Every evening | ORAL | 1 refills | Status: DC | PRN

## 2022-08-29 MED ORDER — ATENOLOL 25 MG PO TABS: 50.0000 mg | ORAL_TABLET | Freq: Two times a day (BID) | ORAL | 0 refills | Status: DC

## 2022-08-29 NOTE — Assessment & Plan Note (Signed)
Continue with atorvastatin unfortunately she has been unable to tolerate a higher dose.  She might be a great candidate for PCK inhibitor as well not sure if she is discussed this with cardiology before.

## 2022-08-29 NOTE — Progress Notes (Signed)
Pt brought in her Home bp cuff the reading was 143/68 p:52.

## 2022-08-29 NOTE — Assessment & Plan Note (Signed)
Need to monitor renal function every 6 months.  Kidney function is up-to-date and stable.  Lab Results  Component Value Date   CREATININE 0.95 08/14/2022   CREATININE 0.88 04/24/2022   CREATININE 0.94 02/28/2022

## 2022-08-29 NOTE — Progress Notes (Signed)
Established Patient Office Visit  Subjective   Patient ID: Traci [name], female    DOB: 1946-05-23  Age: 77 y.o. MRN: [id number]  Chief Complaint  Patient presents with   Hypertension   Insomnia    HPI She has been under a lot of stress recently.  A very close friend of hers was diagnosed with ovarian cancer who lives in [place] and for months she was driving back and forth to help her.  She did recently pass.  A few weeks ago she was just under a lot of stress and her blood pressure in the early hours in the morning shot up to 240/1 oh 10 she ended up going to the emergency department.  She was discharged home and then it happened again another early morning around 5:30 AM and blood pressure shot up to 230/70 and so she went to [facility] health emergency department.  There they did some additional testing including an ultrasound of the aorta which was negative for aneurysm.  She also underwent additional imaging including MR angio abdomen with and without contrast and noted short segment occlusion of superior mesenteric artery just beyond its origin and open graft from the ventral infrarenal aorta to the proximal SMA.  She does have an appointment coming up with vascular surgery as well.  Hypertension- Pt denies chest pain, SOB, dizziness, or heart palpitations.  Taking meds as directed w/o problems.  Denies medication side effects.  Blood pressures have been running back in the 130s this week she has been monitoring them carefully at home and did bring in her home blood pressure cuff.  F/U insomnia -using the Ambien occasionally she was using a little bit more around the time that she was helping take care of her friend who passed away recently.  She is also been going to physical therapy for her neck she started having a lot of pain into her shoulders and had difficulty moving her right arm.  She went to see Dr. Ronnald Ramp, neurosurgery and they had recommended formal PT.  Her shoulders and  arm movement have improved greatly but she still having a lot of throbbing intermittent pain in the lower cervical spine.  They had discussed possibly getting an MRI depending on her progress.  Notes from the emergency department visits as well as imaging tests reviewed.   ROS    Objective:     BP (!) 149/55   Pulse (!) 53   Ht '5\' 4"'$  (1.626 m)   Wt 148 lb (67.1 kg)   SpO2 100%   BMI 25.40 kg/m     Physical Exam Vitals and nursing note reviewed.  Constitutional:      Appearance: She is well-developed.  HENT:     Head: Normocephalic and atraumatic.  Cardiovascular:     Rate and Rhythm: Normal rate and regular rhythm.     Heart sounds: Normal heart sounds.  Pulmonary:     Effort: Pulmonary effort is normal.     Breath sounds: Normal breath sounds.  Skin:    General: Skin is warm and dry.  Neurological:     Mental Status: She is alert and oriented to person, place, and time.  Psychiatric:        Behavior: Behavior normal.      No results found for any visits on 08/29/22.     The ASCVD Risk score (Arnett DK, et al., 2019) failed to calculate for the following reasons:   The valid total cholesterol range is 130  to 320 mg/dL   Unable to determine if patient is Non-Hispanic African American    Assessment & Plan:   Problem List Items Addressed This Visit       Cardiovascular and Mediastinum   Hypertension - Primary (Chronic)    Pressure is elevated here today but over the last week she has been tracking blood pressures at home and they have mostly been back in the 130s.  And she did bring in her home cuff today and it reads about 5 points lower than our machine so I do feel confident that her blood pressures at home are at goal and we will continue to monitor she does have a follow-up cardiology.  She is taking her candesartan twice a day.  The ED had recommended bumping up her atenolol to 50 mg but because of low heart rate she opted to stay at 25 mg twice a day and  did discuss this with her cardiologist and has been sticking with the 25 mg dose for now.      Relevant Medications   atenolol (TENORMIN) 25 MG tablet   Other Relevant Orders   Lipid Panel w/reflex Direct LDL   Atherosclerosis of aorta (HCC)    Continue with atorvastatin unfortunately she has been unable to tolerate a higher dose.  She might be a great candidate for PCK inhibitor as well not sure if she is discussed this with cardiology before.      Relevant Medications   atenolol (TENORMIN) 25 MG tablet     Genitourinary   CKD (chronic kidney disease) stage 3, GFR 30-59 ml/min (HCC)    Need to monitor renal function every 6 months.  Kidney function is up-to-date and stable.  Lab Results  Component Value Date   CREATININE 0.95 08/14/2022   CREATININE 0.88 04/24/2022   CREATININE 0.94 02/28/2022          Other   Insomnia    K to continue as needed use of Ambien for now.      Relevant Medications   zolpidem (AMBIEN) 5 MG tablet   Other Visit Diagnoses     Vitamin D deficiency       Low ferritin       Relevant Orders   Ferritin   Abnormal glucose       Relevant Orders   Hemoglobin A1c      I spent 40 minutes on the day of the encounter to include pre-visit record review, face-to-face time with the patient and post visit ordering of test.  Return in about 6 months (around 03/01/2023) for Hypertension.    Beatrice Lecher, MD

## 2022-08-29 NOTE — Assessment & Plan Note (Signed)
Pressure is elevated here today but over the last week she has been tracking blood pressures at home and they have mostly been back in the 130s.  And she did bring in her home cuff today and it reads about 5 points lower than our machine so I do feel confident that her blood pressures at home are at goal and we will continue to monitor she does have a follow-up cardiology.  She is taking her candesartan twice a day.  The ED had recommended bumping up her atenolol to 50 mg but because of low heart rate she opted to stay at 25 mg twice a day and did discuss this with her cardiologist and has been sticking with the 25 mg dose for now.

## 2022-08-29 NOTE — Assessment & Plan Note (Signed)
K to continue as needed use of Ambien for now.

## 2022-08-30 ENCOUNTER — Other Ambulatory Visit: Payer: Self-pay | Admitting: Cardiovascular Disease

## 2022-09-03 ENCOUNTER — Encounter: Payer: Self-pay | Admitting: Cardiovascular Disease

## 2022-09-03 ENCOUNTER — Ambulatory Visit: Payer: Medicare Other | Attending: Cardiovascular Disease | Admitting: Cardiovascular Disease

## 2022-09-03 VITALS — BP 148/60 | HR 51 | Ht 64.0 in | Wt 152.4 lb

## 2022-09-03 DIAGNOSIS — K551 Chronic vascular disorders of intestine: Secondary | ICD-10-CM

## 2022-09-03 DIAGNOSIS — I10 Essential (primary) hypertension: Secondary | ICD-10-CM

## 2022-09-03 DIAGNOSIS — I2510 Atherosclerotic heart disease of native coronary artery without angina pectoris: Secondary | ICD-10-CM

## 2022-09-03 DIAGNOSIS — I708 Atherosclerosis of other arteries: Secondary | ICD-10-CM

## 2022-09-03 DIAGNOSIS — I700 Atherosclerosis of aorta: Secondary | ICD-10-CM | POA: Diagnosis not present

## 2022-09-03 DIAGNOSIS — I739 Peripheral vascular disease, unspecified: Secondary | ICD-10-CM | POA: Diagnosis not present

## 2022-09-03 NOTE — Patient Instructions (Signed)
Medication Instructions:  No changes *If you need a refill on your cardiac medications before your next appointment, please call your pharmacy*  Follow-Up: At [facility], you and your health needs are our priority.  As part of our continuing mission to provide you with exceptional heart care, we have created designated Provider Care Teams.  These Care Teams include your primary Cardiologist (physician) and Advanced Practice Providers (APPs -  Physician Assistants and Nurse Practitioners) who all work together to provide you with the care you need, when you need it.  We recommend signing up for the patient portal called "MyChart".  Sign up information is provided on this After Visit Summary.  MyChart is used to connect with patients for Virtual Visits (Telemedicine).  Patients are able to view lab/test results, encounter notes, upcoming appointments, etc.  Non-urgent messages can be sent to your provider as well.   To learn more about what you can do with MyChart, go to [email or web address].    Your next appointment:   1 year(s)  Provider:   Mihai Croitoru, MD      

## 2022-09-03 NOTE — Progress Notes (Signed)
Patient ID: Traci [name], female   DOB: [birth year], 77 y.o.   MRN: [id number]    Cardiology Office Note    Date:  09/08/2022   ID:  Traci [name], DOB 06-11-1946, MRN [id number]  PCP:  Hali Marry, MD  Cardiologist:   Sanda Klein, MD   Chief Complaint  Patient presents with   PAD    History of Present Illness:  Traci [name] is a 77 y.o. female with an extensive history of peripheral arterial disease involving the mesenteric circulation and upper extremities, mild nonobstructive coronary atherosclerosis, hyperlipidemia, hypertension.  Her gastrointestinal symptoms appear to improve after a pancreatic duct dilatation performed last year, but she is now having abdominal pain after eating again.  She has to eat small meals.  She has not lost a lot of weight.  Later this week she is scheduled to have repeat Doppler ultrasound of her SMA bypass graft with Dr. Scot Dock. Ultrasound performed 05/06/2020 showed a widely patent superior mesenteric artery bypass graft.  Her blood pressure was markedly elevated when she was taking care of one of her close friends under hospice care, after her friend passed away her blood pressure control is markedly improved.  At home she has recorded blood pressure readings in the 133-139/65-74 range.  Today her blood pressure was a little high at 148/60.  She is planning to start Shag dancing again.  She does not have exertional dyspnea or angina but feels that she is out of shape.  Denies recent dizziness, palpitations or syncope.  She never has intermittent claudication when dancing, although she sometimes has symptoms suggestive of claudication when she walks very long distances (at least half a mile).  The arrhythmia monitor that she wore last December 2023 showed normal sinus rhythm with some blunting of circadian variation (likely due to beta-blocker therapy) and very brief episodes of nonsustained ectopic atrial  tachycardia (maximum 12 seconds).  She does not do well on doses of atenolol higher than 25 mg/day.  Echocardiogram November 2023 showed normal left ventricular systolic function with EF 66% and normal regional wall motion, normal global longitudinal strain at -[id number], grade 2 diastolic dysfunction, no significant valvular abnormalities.  I reviewed her echo.  Although she has left atrial dilation, the mitral annulus diastolic velocities actually pretty good, I am not sure if the diagnosis of diastolic dysfunction is accurate.  Duplex ultrasound performed in December 2022 showed widely patent left subclavian artery bypass graft and no significant stenosis in the carotids.  She had a normal nuclear stress test in 2020 in [place]  and in March 2022 in [place].    Lipid profile performed today shows excellent LDL cholesterol 47 and HDL cholesterol of 60, on statin.  Normal triglycerides.  Hemoglobin A1c was also fair at 6.2% (not on any medicines for diabetes).  She reports that she had anaphylaxis in response to contrast administration in the past but that she is also allergic to Solu-Medrol and prednisone which make her "pass out".  Cardiac catheterization performed with exclusively Benadryl as premedication occurred without adverse events.  Traci [name] has long-standing history of peripheral arterial disease with previous stents and surgical revascularization of the left subclavian artery for subclavian steal syndrome and presyncope. She eventually underwent a left carotid to subclavian bypass.   She underwent a bypass from the aorta to the chronically totally occluded superficial mesenteric artery due to symptoms of chronic mesenteric ischemia, (Dr. Scot Dock August 2015  6 mm Dacron graft) She had moderate CAD  by coronary angiography performed in 2009. Normal left ventricular systolic function and no perfusion abnormalities by echo 2015 and nuclear stress test performed in 2014 and   October 2017. She had carotid duplex ultrasonography last performed in March 2019, without evidence of significant obstruction. She has a left carotid-subclavian bypass graft without evidence of stenosis. The aortic SMA bypass was noted on abdominal CT performed in July 2018, although no comment was made about its patency. There was no evidence of aortic aneurysm and no comment made about the renal arteries.   Past Medical History:  Diagnosis Date   Allergy 1995   Contrast dye   Anxiety    Arthritis 2023   Atrial fibrillation (HCC)    CAD (coronary artery disease)    CKD (chronic kidney disease) stage 3, GFR 30-59 ml/min (HCC) 08/04/2017   Claudication ([place]) 01/06/2008   Lower extremity dopplers - no evidence of arterial insufficiency, normal exam   Coronary artery disease    GERD (gastroesophageal reflux disease)    Heart murmur    Hyperlipidemia    Hypertension 11/30/2010   echo- EF 55%; normal w/ mildly sclerotic aortic valve   Hypertension 11/05/2011   renal dopplers - celiac artery and SMA >50% diameter reduction, R renal artery - mildly elevated velocities 1-59% diameter reduction, L renal artery normal   Nonspecific ST-T wave electrocardiographic changes 03/26/2011   R/Lmv - EF 74%, normal perfusion all regions, ST depression w/ Lexiscan infusion w/o assoc angina   Osteopenia    PAD (peripheral artery disease) (HCC)    Peripheral vascular disease (HCC)    PVD (peripheral vascular disease) ([place]) 11/05/2011   doppler - R/L brachial pressures essentially equal w/o inflow disease; L sublclavian/CCA bypass graft demonstrates patent flow, no evidence of significant stenosis   Sigmoid diverticulitis     Past Surgical History:  Procedure Laterality Date   ABDOMINAL AORTIC ANEURYSM REPAIR N/A 01/27/2014   Procedure: AORTO-SUPERIOR MESENTERIC ARTERY BYPASS GRAFT;  Surgeon: Angelia Mould, MD;  Location: [facility];  Service: Vascular;  Laterality: N/A;   ABDOMINAL HYSTERECTOMY      APPENDECTOMY     CARDIAC CATHETERIZATION  06/03/2008   60% LAD, involving D2, borderline significant by IVUS, medical therapy. CFX, RCA OK.   CATARACT EXTRACTION     CHOLECYSTECTOMY     EYE SURGERY     retinal surgery   SMALL INTESTINE SURGERY     SPINE SURGERY  07/2009   SUBCLAVIAN ARTERY STENT  1995   X's  several   SUBCLAVIAN ARTERY STENT Left 09/20/2008   LSA ISR 7x52mm Cordis Genesis on Opta premount, reduction from 80% to 0%   subclavian artery stents  01/23/2010   Left carotid to subclavian artery Bypass   VISCERAL ANGIOGRAM  01/17/2014   Procedure: VISCERAL ANGIOGRAM;  Surgeon: Angelia Mould, MD;  Location: [facility] CATH LAB;  Service: Cardiovascular;;    Outpatient Medications Prior to Visit  Medication Sig Dispense Refill   aspirin EC 81 MG tablet Take 81 mg by mouth daily.     atenolol (TENORMIN) 25 MG tablet Take 25 mg by mouth daily.     atorvastatin (LIPITOR) 10 MG tablet TAKE 1 TABLET DAILY AT 6 P.M. 90 tablet 3   candesartan (ATACAND) 4 MG tablet Take 1 tablet (4 mg total) by mouth in the morning and at bedtime. (Patient taking differently: Take 4 mg by mouth in the morning and at bedtime. Pt reports that she is taking this PRN) 180 tablet 3  cholecalciferol (VITAMIN D) 1000 UNITS tablet Take 1,000 Units by mouth daily.     clopidogrel (PLAVIX) 75 MG tablet TAKE 1 TABLET DAILY 90 tablet 3   clorazepate (TRANXENE) 7.5 MG tablet TAKE ONE-HALF (1/2) TO ONE TABLET DAILY AS NEEDED 85 tablet 0   cyanocobalamin 1000 MCG tablet Take 1,000 mcg by mouth daily.     esomeprazole (NEXIUM) 40 MG capsule Take 1 capsule (40 mg total) by mouth daily. Take 1 tab daily 90 capsule 0   estradiol (ESTRACE) 0.5 MG tablet TAKE 1 TABLET DAILY 90 tablet 3   furosemide (LASIX) 20 MG tablet Take 20 mg by mouth 3 (three) times a week.     linaclotide (LINZESS) 145 MCG CAPS capsule Take 145 mcg by mouth as needed. constipation     meclizine (ANTIVERT) 25 MG tablet TAKE ONE-HALF (1/2) TABLET  THREE TIMES A DAY AS NEEDED FOR DIZZINESS OR NAUSEA 60 tablet 8   Multiple Vitamin (MULTIVITAMIN WITH MINERALS) TABS Take 1 tablet by mouth daily.     zolpidem (AMBIEN) 5 MG tablet Take 1 tablet (5 mg total) by mouth at bedtime as needed for sleep. 90 tablet 1   atenolol (TENORMIN) 50 MG tablet TAKE 1 TABLET DAILY 30 tablet 11   EPINEPHrine (EPIPEN 2-PAK) 0.3 mg/0.3 mL IJ SOAJ injection Inject 0.3 mLs (0.3 mg total) into the muscle as needed (for allergic reaction). 2 Device 0   furosemide (LASIX) 20 MG tablet TAKE 1 TABLET EVERY OTHER DAY 45 tablet 3   No facility-administered medications prior to visit.     Allergies:   Cortisone, Dilaudid [hydromorphone hcl], Iodine, Medrol [methylprednisolone], Omnipaque [iohexol], Prednisone, Shellfish allergy, Sulfa drugs cross reactors, Z-pak [azithromycin], Doxycycline, Ivp dye [iodinated contrast media], Methylprednisolone sodium succ, Sulfa antibiotics, Augmentin [amoxicillin-pot clavulanate], Codeine, Erythromycin, and Morphine and related   Social History   Socioeconomic History   Marital status: Widowed    Spouse name: Not on file   Number of children: 1   Years of education: 12   Highest education level: Associate degree: academic program  Occupational History   Occupation: retired    Comment: Marine scientist  Tobacco Use   Smoking status: Former    Packs/day: 0.25    Years: 20.00    Additional pack years: 0.00    Total pack years: 5.00    Types: Cigarettes    Quit date: 09/30/1993    Years since quitting: 28.9   Smokeless tobacco: Never  Vaping Use   Vaping Use: Never used  Substance and Sexual Activity   Alcohol use: No   Drug use: No   Sexual activity: Not Currently  Other Topics Concern   Not on file  Social History Narrative   Lives with her son. Likes to go dancing and likes play online games such as scrabble.    Social Determinants of Health   Financial Resource Strain: Low Risk  (08/06/2022)   Overall Financial Resource Strain  (CARDIA)    Difficulty of Paying Living Expenses: Not hard at all  Food Insecurity: No Food Insecurity (08/06/2022)   Hunger Vital Sign    Worried About Running Out of Food in the Last Year: Never true    Ran Out of Food in the Last Year: Never true  Transportation Needs: No Transportation Needs (08/06/2022)   PRAPARE - Hydrologist (Medical): No    Lack of Transportation (Non-Medical): No  Physical Activity: Insufficiently Active (08/06/2022)   Exercise Vital Sign    Days  of Exercise per Week: 2 days    Minutes of Exercise per Session: 30 min  Stress: No Stress Concern Present (08/06/2022)   [place]    Feeling of Stress : Only a little  Social Connections: Moderately Isolated (08/09/2022)   Social Connection and Isolation Panel [NHANES]    Frequency of Communication with Friends and Family: Three times a week    Frequency of Social Gatherings with Friends and Family: Twice a week    Attends Religious Services: Never    Marine scientist or Organizations: No    Attends Music therapist: 1 to 4 times per year    Marital Status: Widowed     Family History:  The patient's family history includes Alcohol abuse in her father; Arthritis in her mother; Diabetes in her maternal grandmother; Heart attack in her father; Heart disease in her father and paternal grandfather; Hyperlipidemia in her father; Hypertension in her father; Kidney disease in her father; Kidney failure in her mother.   ROS:   Please see the history of present illness.    ROS All other systems are reviewed and are negative.   PHYSICAL EXAM:   VS:  BP (!) 148/60   Pulse (!) 51   Ht 5\' 4"  (1.626 m)   Wt 152 lb 6.4 oz (69.1 kg)   SpO2 92%   BMI 26.16 kg/m    There is no difference in blood pressures between the right and left upper extremities.   General: Alert, oriented x3, no distress, appears  well Head: no evidence of trauma, PERRL, EOMI, no exophtalmos or lid lag, no myxedema, no xanthelasma; normal ears, nose and oropharynx Neck: normal jugular venous pulsations and no hepatojugular reflux; brisk carotid pulses without delay , but with bilateral carotid and subclavian bruits Chest: clear to auscultation, no signs of consolidation by percussion or palpation, normal fremitus, symmetrical and full respiratory excursions Cardiovascular: normal position and quality of the apical impulse, regular rhythm, normal first and second heart sounds, early peaking 2/6 aortic ejection murmur, no diastolic murmurs, rubs or gallops Abdomen: no tenderness or distention, no masses by palpation, no abnormal pulsatility or arterial bruits, normal bowel sounds, no hepatosplenomegaly Extremities: no clubbing, cyanosis or edema; 2+ radial, ulnar and brachial pulses bilaterally; 2+ right femoral, posterior tibial and dorsalis pedis pulses; 2+ left femoral, posterior tibial and dorsalis pedis pulses; no subclavian or femoral bruits Neurological: grossly nonfocal Psych: Normal mood and affect      Wt Readings from Last 3 Encounters:  09/05/22 149 lb (67.6 kg)  09/03/22 152 lb 6.4 oz (69.1 kg)  08/29/22 148 lb (67.1 kg)    Studies/Labs Reviewed:   EKG:  EKG is not ordered today.  ECG from 08/14/2022 shows NSR, normal tracing (short PR interval and reported anteroseptal infarction related to artifact).   Echocardiogram 05/16/2022:   1. Left ventricular ejection fraction, by estimation, is 65 to 70%. Left  ventricular ejection fraction by 3D volume is 66 %. The left ventricle has  normal function. The left ventricle has no regional wall motion  abnormalities. Left ventricular diastolic   parameters are consistent with Grade II diastolic dysfunction  (pseudonormalization). The average left ventricular global longitudinal  strain is -23.4 %. The global longitudinal strain is normal.   2. Right  ventricular systolic function is normal. The right ventricular  size is normal. There is normal pulmonary artery systolic pressure.   3. Left atrial size was severely  dilated.   4. The mitral valve is degenerative. Trivial mitral valve regurgitation.  No evidence of mitral stenosis.   5. The aortic valve is normal in structure. Aortic valve regurgitation is  not visualized. No aortic stenosis is present.   6. The inferior vena cava is normal in size with greater than 50%  respiratory variability, suggesting right atrial pressure of 3 mmHg.    Duplex carotid ultrasound 05/31/2021: Left Graft #1: +--------------------+---++++ Inflow              114 +--------------------+---++++ Proximal Anastomosis136 +--------------------+---++++ Proximal Graft      122 +--------------------+---++++ Mid Graft           177 +--------------------+---++++ Distal Graft        369 +--------------------+---++++ Distal Anastamosis  334 +--------------------+---++++     Summary: Right Carotid: Velocities in the right ICA are consistent with a 1-39% stenosis.  Left Carotid: Velocities in the left ICA are consistent with a 1-39% stenosis.  Vertebrals:  Bilateral vertebral arteries demonstrate antegrade flow. Subclavians: Normal flow hemodynamics were seen in the right subclavian artery.              Patent left subclavian artery bypass graft.   Arrhythmia monitor 07/06/2021:  The dominant rhythm is mild sinus bradycardia with markedly blunted circadian variation There are very rare episodes of brief nonsustained atrial tachycardia (4-7 beats) and rare PACs. There is no atrial fibrillation. There is no evidence of significant ventricular arrhythmia. There are no episodes of severe bradycardia or pauses.   Abnormal arrhythmia monitor to the presence of mild sinus bradycardia markedly blunted chronotropic response.  At least in part this is due to treatment with  beta-blockers Palpitations are probably due to PACs and rare brief episodes of paroxysmal atrial tachycardia.     Lexiscan Myoview 01/13/2019: The left ventricular ejection fraction is hyperdynamic (>65%). Nuclear stress EF: 71%. There was no ST segment deviation noted during stress. T wave inversion was noted during stress in the II, III, aVF, V3, V4, V5 and V6 leads, beginning at 30 seconds of stress, and returning to baseline after less than 1 min of recovery. This is a low risk study.   No ischemia or infarction noted.  There is significant tracer uptake in the bowel which may impact the interpretation of the inferior wall which appears to have normal perfusion. Wall motion is normal, therefore perfusion is likely normal.    Consider performing future studies on the D-Spect (supine and upright) camera.    Abdominal aortic ultrasound [facility] 09/12/2020 IMPRESSION:  Visualization of superior mesenteric artery is limited, however the proximal aspect is appears to be patent. It is unclear if the entire graft is visualized. A CT scan of the abdomen and pelvis with intravenous contrast would be helpful in further evaluation.   Vascular:  - Proximal abdominal aorta measures: 2.1 cm AP x 2.2 cm in width.  - Mid abdominal aorta measures 1.4 cm AP x 1.5 cm in width.    - Distal abdominal aorta measures 1.2 cm AP x 1.2 cm in width.   Blood flow is documented in the normal direction in the abdominal aorta.   - Right common iliac artery measures 0.7 cm AP.  - Left common iliac artery measures 0.7 cm AP.   - Proximal aspect of the superior mesenteric artery was identified and is patent. The remainder of the SMA was not visualized due to bowel gas.   Miscellaneous:  - Left kidneys are normal in size,  with the right kidney measuring up to 9.2 cm and the left measuring up to 9.7 cm.  Lower extremity arterial Dopplers 11/06/2020 Summary:  Right ABI 1.13, left ABI 1.14  Right: Minimum  atherosclerosis noted.  30-49% stenosis in the proximal CFA.  30-495 stenosis in the TPT, low end range.  Three vessel run-off, with 30-49% stenosis noted in the mid and distal PTA, low end range.   Left: Minimum atherosclerosis noted.  30-49% stenosis in the proximal CFA, low end range.  Three vessel run-off, with 30-49% stenosis noted in the mid and distal PTA, low end range.  Recent Labs: 08/14/2022: ALT 18; BUN 7; Creatinine, Ser 0.95; Hemoglobin 12.7; Platelets 324; Potassium 3.9; Sodium 135   Lipid Panel    Component Value Date/Time   CHOL 125 09/04/2022 0958   CHOL 129 02/17/2019 1234   TRIG 99 09/04/2022 0958   HDL 60 09/04/2022 0958   HDL 47 02/17/2019 1234   CHOLHDL 2.1 09/04/2022 0958   VLDL 26 02/07/2016 0822   LDLCALC 47 09/04/2022 0958    ASSESSMENT:    1. 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 Left subclavian artery occlusion   4. Atherosclerosis of aorta ([place])   5. PAD (peripheral artery disease) ([place])   6. Essential hypertension      PLAN:  In order of problems listed above:  CAD: Asymptomatic.  Denies angina at rest or with dancing.  Has a known moderate stenosis in the LAD artery by previous coronary angiography.  No evidence of ischemia by myocardial perfusion study January 05, 2019 or by the nuclear stress test performed in March 2022 in [place]. SMA stenosis: SMA bypass is widely patent by ultrasound last November and this will be repeated later this week.  Abdominal symptoms had improved following pancreatic duct dilation, but have recurred. L SCL artery occlusion: Equal blood pressures in the right and left upper extremity.  No symptoms of upper extremity claudication or vertebral steal syndrome.   HLP: All lipid parameters are excellent on the current statin.  Continue. Ao atherosclerosis: Well documented on multiple radiological studies  and associated with extensive PAD.  Increases the likelihood of renal artery stenosis and secondary hypertension. PAD: Non-lifestyle limiting claudication.  Although she has inconsistent left calf claudication, she had no more than moderate stenoses in the arterial system and multiple locations in both lower extremities, normal bilateral ABIs HTN: Good control when checked at home.  Blood pressure is a little high today, will monitor.  Medication Adjustments/Labs and Tests Ordered: Current medicines are reviewed at length with the patient today.  Concerns regarding medicines are outlined above.  Medication changes, Labs and Tests ordered today are listed in the Patient Instructions below. Patient Instructions  Medication Instructions:  No changes *If you need a refill on your cardiac medications before your next appointment, please call your pharmacy*  Follow-Up: At [facility], you and your health needs are our priority.  As part of our continuing mission to provide you with exceptional heart care, we have created designated Provider Care Teams.  These Care Teams include your primary Cardiologist (physician) and Advanced Practice Providers (APPs -  Physician Assistants and Nurse Practitioners) who all work together to provide you with the care you need, when you need it.  We recommend signing up for the patient portal called "MyChart".  Sign up information is provided on this After Visit Summary.  MyChart is used to connect with patients for Virtual Visits (Telemedicine).  Patients are able to view lab/test results, encounter notes, upcoming appointments, etc.  Non-urgent messages can be sent to your provider as well.   To learn more about what you can do with MyChart, go to [email or web address].    Your next appointment:   1 year(s)  Provider:   Sanda Klein, MD         Signed, Sanda Klein, MD  09/08/2022 2:49 PM    [facility] [place], [place], [place]  [address] Phone: [phone]; Fax: [phone]

## 2022-09-04 DIAGNOSIS — R790 Abnormal level of blood mineral: Secondary | ICD-10-CM | POA: Diagnosis not present

## 2022-09-04 DIAGNOSIS — R7309 Other abnormal glucose: Secondary | ICD-10-CM | POA: Diagnosis not present

## 2022-09-04 DIAGNOSIS — I10 Essential (primary) hypertension: Secondary | ICD-10-CM | POA: Diagnosis not present

## 2022-09-05 ENCOUNTER — Encounter: Payer: Self-pay | Admitting: Vascular Surgery

## 2022-09-05 ENCOUNTER — Ambulatory Visit (HOSPITAL_COMMUNITY)
Admission: RE | Admit: 2022-09-05 | Discharge: 2022-09-05 | Disposition: A | Discharge status: Home / Self Care | Payer: Medicare Other | Source: Ambulatory Visit | Attending: Vascular Surgery | Admitting: Vascular Surgery

## 2022-09-05 ENCOUNTER — Ambulatory Visit (INDEPENDENT_AMBULATORY_CARE_PROVIDER_SITE_OTHER): Payer: Medicare Other | Admitting: Vascular Surgery

## 2022-09-05 VITALS — BP 152/73 | HR 68 | Temp 97.8°F | Resp 20 | Ht 64.0 in | Wt 149.0 lb

## 2022-09-05 DIAGNOSIS — K551 Chronic vascular disorders of intestine: Secondary | ICD-10-CM

## 2022-09-05 DIAGNOSIS — Z48812 Encounter for surgical aftercare following surgery on the circulatory system: Secondary | ICD-10-CM | POA: Diagnosis not present

## 2022-09-05 DIAGNOSIS — I771 Stricture of artery: Secondary | ICD-10-CM | POA: Diagnosis not present

## 2022-09-05 LAB — HEMOGLOBIN A1C
Hgb A1c MFr Bld: 6.2 % of total Hgb — ABNORMAL HIGH (ref <5.7)
Mean Plasma Glucose: 131 mg/dL
eAG (mmol/L): 7.3 mmol/L

## 2022-09-05 LAB — LIPID PANEL W/REFLEX DIRECT LDL
Cholesterol: 125 mg/dL (ref <200)
HDL: 60 mg/dL (ref >=50)
LDL Cholesterol (Calc): 47 mg/dL (calc)
Non-HDL Cholesterol (Calc): 65 mg/dL (calc) (ref <130)
Total CHOL/HDL Ratio: 2.1 (calc) (ref <5.0)
Triglycerides: 99 mg/dL (ref <150)

## 2022-09-05 LAB — FERRITIN: Ferritin: 62 ng/mL (ref 16–288)

## 2022-09-05 NOTE — Progress Notes (Signed)
REASON FOR VISIT:   Routine follow-up visit  MEDICAL ISSUES:   S/P AORTO SMA BYPASS: This patient has had some abdominal pain recently and is followed by gastroenterology.  However her aorto SMA bypass graft is widely patent and I do not think her symptoms are consistent with chronic mesenteric ischemia.  The symptoms occur immediately after eating where his symptoms are chronic mesenteric ischemia oftentimes occur 15 to 20 minutes after eating.  She has had no weight loss.  She is scheduled to see her gastroenterologist soon for further workup.   S/P LEFT CAROTID SUBCLAVIAN BYPASS: She has a palpable left radial pulse and no significant vertebrobasilar symptoms.  I will get a carotid duplex scan in 1 year and we will check the patency of her bypass graft.  She would like to be seen by a physician in 1 year when she returns and so I will make those arrangements.  I explained that I will be retiring in September.   HPI:   Traci [name] is a pleasant 77 y.o. female who I last saw on 05/31/2021.  She is well-known to me.  She has had a previous left carotid subclavian bypass and also an aorto SMA bypass in 2015.  She also tells me that we did her exposure for spine surgery.  When I saw her last she was asymptomatic with no postprandial abdominal pain.  Her carotid subclavian bypass graft was patent and she was having no vertebrobasilar symptoms or left arm symptoms.  She comes in for a 1 year follow-up visit.    Since I saw her last, she has been having some abdominal pain after eating.  The pain occurs right with eating and there is no delay.  She denies any weight loss.  She also has some associated nausea.  She has had multiple GI issues in the past and is followed by gastroenterology.  She denies any history of stroke, TIAs, expressive or receptive aphasia, or amaurosis fugax.  She has had no significant dizziness.  Past Medical History:  Diagnosis Date   Allergy 1995    Contrast dye   Anxiety    Arthritis 2023   Atrial fibrillation (HCC)    CAD (coronary artery disease)    CKD (chronic kidney disease) stage 3, GFR 30-59 ml/min (HCC) 08/04/2017   Claudication ([place]) 01/06/2008   Lower extremity dopplers - no evidence of arterial insufficiency, normal exam   Coronary artery disease    GERD (gastroesophageal reflux disease)    Heart murmur    Hyperlipidemia    Hypertension 11/30/2010   echo- EF 55%; normal w/ mildly sclerotic aortic valve   Hypertension 11/05/2011   renal dopplers - celiac artery and SMA >50% diameter reduction, R renal artery - mildly elevated velocities 1-59% diameter reduction, L renal artery normal   Nonspecific ST-T wave electrocardiographic changes 03/26/2011   R/Lmv - EF 74%, normal perfusion all regions, ST depression w/ Lexiscan infusion w/o assoc angina   Osteopenia    PAD (peripheral artery disease) (HCC)    Peripheral vascular disease (HCC)    PVD (peripheral vascular disease) ([place]) 11/05/2011   doppler - R/L brachial pressures essentially equal w/o inflow disease; L sublclavian/CCA bypass graft demonstrates patent flow, no evidence of significant stenosis   Sigmoid diverticulitis     Family History  Problem Relation Age of Onset   Heart disease Father        Heart Disease before age 26   Kidney disease Father  Heart attack Father    Hyperlipidemia Father    Hypertension Father    Alcohol abuse Father    Kidney failure Mother    Arthritis Mother    Diabetes Maternal Grandmother    Heart disease Paternal Grandfather     SOCIAL HISTORY: Social History   Tobacco Use   Smoking status: Former    Packs/day: 0.25    Years: 20.00    Additional pack years: 0.00    Total pack years: 5.00    Types: Cigarettes    Quit date: 09/30/1993    Years since quitting: 28.9   Smokeless tobacco: Never  Substance Use Topics   Alcohol use: No    Allergies  Allergen Reactions   Cortisone Anaphylaxis   Dilaudid  [Hydromorphone Hcl] Nausea And Vomiting    Pt states she will start vomiting immediately for 6 hours   Iodine Anaphylaxis   Medrol [Methylprednisolone] Anaphylaxis   Omnipaque [Iohexol] Anaphylaxis   Prednisone Anaphylaxis and Swelling   Shellfish Allergy Anaphylaxis   Sulfa Drugs Cross Reactors Anaphylaxis   Z-Pak [Azithromycin] Swelling   Doxycycline     Thrush/itching   Ivp Dye [Iodinated Contrast Media]     Other reaction(s): Unknown   Methylprednisolone Sodium Succ Swelling    Blood pressure drops immediately   Sulfa Antibiotics Other (See Comments)    Stomach upset   Augmentin [Amoxicillin-Pot Clavulanate] Nausea Only    Upset stomach (Has taken cephalexin in the past without problems)   Codeine Rash and Other (See Comments)    Upset stomach   Erythromycin Rash   Morphine And Related Nausea Only    nausea    Current Outpatient Medications  Medication Sig Dispense Refill   aspirin EC 81 MG tablet Take 81 mg by mouth daily.     atenolol (TENORMIN) 25 MG tablet Take 25 mg by mouth daily.     atenolol (TENORMIN) 50 MG tablet TAKE 1 TABLET DAILY 30 tablet 11   atorvastatin (LIPITOR) 10 MG tablet TAKE 1 TABLET DAILY AT 6 P.M. 90 tablet 3   candesartan (ATACAND) 4 MG tablet Take 1 tablet (4 mg total) by mouth in the morning and at bedtime. (Patient taking differently: Take 4 mg by mouth in the morning and at bedtime. Pt reports that she is taking this PRN) 180 tablet 3   cholecalciferol (VITAMIN D) 1000 UNITS tablet Take 1,000 Units by mouth daily.     clopidogrel (PLAVIX) 75 MG tablet TAKE 1 TABLET DAILY 90 tablet 3   clorazepate (TRANXENE) 7.5 MG tablet TAKE ONE-HALF (1/2) TO ONE TABLET DAILY AS NEEDED 85 tablet 0   cyanocobalamin 1000 MCG tablet Take 1,000 mcg by mouth daily.     EPINEPHrine (EPIPEN 2-PAK) 0.3 mg/0.3 mL IJ SOAJ injection Inject 0.3 mLs (0.3 mg total) into the muscle as needed (for allergic reaction). 2 Device 0   esomeprazole (NEXIUM) 40 MG capsule Take 1  capsule (40 mg total) by mouth daily. Take 1 tab daily 90 capsule 0   estradiol (ESTRACE) 0.5 MG tablet TAKE 1 TABLET DAILY 90 tablet 3   furosemide (LASIX) 20 MG tablet TAKE 1 TABLET EVERY OTHER DAY 45 tablet 3   furosemide (LASIX) 20 MG tablet Take 20 mg by mouth 3 (three) times a week.     linaclotide (LINZESS) 145 MCG CAPS capsule Take 145 mcg by mouth as needed. constipation     meclizine (ANTIVERT) 25 MG tablet TAKE ONE-HALF (1/2) TABLET THREE TIMES A DAY AS NEEDED FOR DIZZINESS  OR NAUSEA 60 tablet 8   Multiple Vitamin (MULTIVITAMIN WITH MINERALS) TABS Take 1 tablet by mouth daily.     zolpidem (AMBIEN) 5 MG tablet Take 1 tablet (5 mg total) by mouth at bedtime as needed for sleep. 90 tablet 1   No current facility-administered medications for this visit.    REVIEW OF SYSTEMS:  [X]  denotes positive finding, [ ]  denotes negative finding Cardiac  Comments:  Chest pain or chest pressure:    Shortness of breath upon exertion:    Short of breath when lying flat:    Irregular heart rhythm: x       Vascular    Pain in calf, thigh, or hip brought on by ambulation:    Pain in feet at night that wakes you up from your sleep:     Blood clot in your veins:    Leg swelling:         Pulmonary    Oxygen at home:    Productive cough:     Wheezing:         Neurologic    Sudden weakness in arms or legs:     Sudden numbness in arms or legs:     Sudden onset of difficulty speaking or slurred speech:    Temporary loss of vision in one eye:     Problems with dizziness:         Gastrointestinal    Blood in stool:     Vomited blood:         Genitourinary    Burning when urinating:     Blood in urine:        Psychiatric    Major depression:         Hematologic    Bleeding problems:    Problems with blood clotting too easily:        Skin    Rashes or ulcers:        Constitutional    Fever or chills:     PHYSICAL EXAM:   Vitals:   09/05/22 0927  BP: (!) 152/73  Pulse: 68   Resp: 20  Temp: 97.8 F (36.6 C)  SpO2: 98%  Weight: 149 lb (67.6 kg)  Height: 5\' 4"  (1.626 m)    GENERAL: The patient is a well-nourished female, in no acute distress. The vital signs are documented above. CARDIAC: There is a regular rate and rhythm.  VASCULAR: I do not detect carotid bruits. She has palpable radial pulses. She has palpable posterior tibial pulses. PULMONARY: There is good air exchange bilaterally without wheezing or rales. ABDOMEN: Soft and non-tender with normal pitched bowel sounds.  MUSCULOSKELETAL: There are no major deformities or cyanosis. NEUROLOGIC: No focal weakness or paresthesias are detected. SKIN: There are no ulcers or rashes noted. PSYCHIATRIC: The patient has a normal affect.  DATA:    MESENTERIC DUPLEX: I have independently interpreted her mesenteric duplex scan.  Her celiac axis is widely patent.  Her aorta SMA bypass is patent.  Traci [name] [facility] [phone]

## 2022-09-06 ENCOUNTER — Other Ambulatory Visit: Payer: Self-pay | Admitting: Neurological Surgery

## 2022-09-06 DIAGNOSIS — M5412 Radiculopathy, cervical region: Secondary | ICD-10-CM

## 2022-09-06 NOTE — Progress Notes (Signed)
Hi Traci [name], A1c is elevated in the prediabetes range at 6.2.  Anything 5.7-6.4 is considered prediabetes.  Just encouraged her to work on cutting back on sweets and carbs and sugary beverages and eat more vegetables and lean proteins.  Plan will be to recheck your A1c in about 4 months to see if it is stable or hopefully improving.  Your cholesterol and iron stores are at goal.  If you are taking an iron supplement you can discontinue at this point.

## 2022-09-08 ENCOUNTER — Encounter: Payer: Self-pay | Admitting: Cardiovascular Disease

## 2022-09-09 ENCOUNTER — Other Ambulatory Visit: Payer: Self-pay

## 2022-09-09 ENCOUNTER — Telehealth: Payer: Self-pay | Admitting: Cardiovascular Disease

## 2022-09-09 DIAGNOSIS — R1033 Periumbilical pain: Secondary | ICD-10-CM | POA: Diagnosis not present

## 2022-09-09 DIAGNOSIS — K5904 Chronic idiopathic constipation: Secondary | ICD-10-CM | POA: Diagnosis not present

## 2022-09-09 DIAGNOSIS — K219 Gastro-esophageal reflux disease without esophagitis: Secondary | ICD-10-CM | POA: Diagnosis not present

## 2022-09-09 MED ORDER — ATENOLOL 25 MG PO TABS: 25.0000 mg | ORAL_TABLET | Freq: Every day | ORAL | 2 refills | Status: DC

## 2022-09-09 NOTE — Telephone Encounter (Signed)
*  STAT* If patient is at the pharmacy, call can be transferred to refill team.   1. Which medications need to be refilled? (please list name of each medication and dose if known) atenolol (TENORMIN) 25 MG tablet   2. Which pharmacy/location (including street and city if local pharmacy) is medication to be sent to?  [facility] [place], [place]    3. Do they need a 30 day or 90 day supply? [id number]  Pharmacy states they got a prescription for 50mg  instead of 25mg

## 2022-09-20 ENCOUNTER — Other Ambulatory Visit: Payer: Self-pay | Admitting: Family Medicine

## 2022-09-23 NOTE — Telephone Encounter (Signed)
Last OV: 08/29/22 Next OV: 03/03/23 Last RF: 07/12/22

## 2022-09-24 MED ORDER — CLORAZEPATE DIPOTASSIUM 7.5 MG PO TABS: ORAL_TABLET | ORAL | 0 refills | Status: DC

## 2022-10-02 ENCOUNTER — Telehealth: Payer: Self-pay | Admitting: Cardiovascular Disease

## 2022-10-02 DIAGNOSIS — M79604 Pain in right leg: Secondary | ICD-10-CM

## 2022-10-02 DIAGNOSIS — I739 Peripheral vascular disease, unspecified: Secondary | ICD-10-CM

## 2022-10-02 NOTE — Telephone Encounter (Signed)
Having said that, it seems that it is time for her to have her lower extremity arterial Dopplers and ABIs done.  Could we please order that?

## 2022-10-02 NOTE — Telephone Encounter (Signed)
Patient states that she would like a call back to discuss pain in calves, feet, hand and burning. She is unsure what the cause of this is. She states she does have Peripheral Vascular disease and is wondering if this could be the cause. Requesting return call.

## 2022-10-02 NOTE — Telephone Encounter (Signed)
Patient states that she would like a call back to discuss pain in calves, feet, hand and burning. She is unsure what the cause of this is. She states she does have Peripheral Vascular disease and is wondering if this could be the cause. Requesting return call.   Patients states started Saturday evening.  Comes and goes. When starts it is pain and redness and "burns". Feels like "pins and needles and tingling". Went throughout the nigh last night.  Elevated legs, repositioned, icy- hot, tylenol, but no relief until this morning . She states it alleviated long enough to get about 2 hours sleep.  Once got up she states it started again. No new medication,, no stressors, no injury.  Advised it does sound like nerve pain, but will send to provider to review and receive recommendations

## 2022-10-02 NOTE — Telephone Encounter (Signed)
Having said that, it seems that it is time for her to have her lower extremity arterial Dopplers and ABIs done.  Could we please order that?     Your physician has requested that you have an ankle brachial index (ABI). During this test an ultrasound and blood pressure cuff are used to evaluate the arteries that supply the arms and legs with blood. Allow thirty minutes for this exam. There are no restrictions or special instructions. This will take place at [address] [facility], [address].    Your physician has requested that you have a lower extremity arterial exercise duplex. During this test, exercise and ultrasound are used to evaluate arterial blood flow in the legs. Allow one hour for this exam. There are no restrictions or special instructions.    Called patient and explained Dr Croitoru's recommendations. She verbalized understanding. Told that she will receive a call to get this scheduled.

## 2022-10-04 ENCOUNTER — Ambulatory Visit: Payer: Medicare Other | Admitting: Sports Medicine

## 2022-10-08 ENCOUNTER — Ambulatory Visit
Admission: RE | Admit: 2022-10-08 | Discharge: 2022-10-08 | Disposition: A | Discharge status: Home / Self Care | Payer: Medicare Other | Source: Ambulatory Visit | Attending: Neurological Surgery | Admitting: Neurological Surgery

## 2022-10-08 DIAGNOSIS — M47812 Spondylosis without myelopathy or radiculopathy, cervical region: Secondary | ICD-10-CM | POA: Diagnosis not present

## 2022-10-08 DIAGNOSIS — M5412 Radiculopathy, cervical region: Secondary | ICD-10-CM

## 2022-10-08 DIAGNOSIS — M542 Cervicalgia: Secondary | ICD-10-CM | POA: Diagnosis not present

## 2022-10-08 DIAGNOSIS — R202 Paresthesia of skin: Secondary | ICD-10-CM | POA: Diagnosis not present

## 2022-10-11 DIAGNOSIS — H43812 Vitreous degeneration, left eye: Secondary | ICD-10-CM | POA: Diagnosis not present

## 2022-10-11 DIAGNOSIS — H35372 Puckering of macula, left eye: Secondary | ICD-10-CM | POA: Diagnosis not present

## 2022-10-11 DIAGNOSIS — H3581 Retinal edema: Secondary | ICD-10-CM | POA: Diagnosis not present

## 2022-10-11 DIAGNOSIS — H31091 Other chorioretinal scars, right eye: Secondary | ICD-10-CM | POA: Diagnosis not present

## 2022-10-15 ENCOUNTER — Ambulatory Visit (HOSPITAL_COMMUNITY)
Admission: RE | Admit: 2022-10-15 | Discharge: 2022-10-15 | Disposition: A | Discharge status: Home / Self Care | Payer: Medicare Other | Source: Ambulatory Visit | Attending: Cardiovascular Disease | Admitting: Cardiovascular Disease

## 2022-10-15 DIAGNOSIS — M79605 Pain in left leg: Secondary | ICD-10-CM | POA: Insufficient documentation

## 2022-10-15 DIAGNOSIS — I739 Peripheral vascular disease, unspecified: Secondary | ICD-10-CM

## 2022-10-15 DIAGNOSIS — M79604 Pain in right leg: Secondary | ICD-10-CM | POA: Diagnosis not present

## 2022-10-15 LAB — VAS US ABI WITH/WO TBI
Left ABI: 1.25
Right ABI: 1.04

## 2022-10-21 DIAGNOSIS — Z90710 Acquired absence of both cervix and uterus: Secondary | ICD-10-CM | POA: Diagnosis not present

## 2022-10-21 DIAGNOSIS — K5730 Diverticulosis of large intestine without perforation or abscess without bleeding: Secondary | ICD-10-CM | POA: Diagnosis not present

## 2022-10-21 DIAGNOSIS — K449 Diaphragmatic hernia without obstruction or gangrene: Secondary | ICD-10-CM | POA: Diagnosis not present

## 2022-10-21 DIAGNOSIS — Z8719 Personal history of other diseases of the digestive system: Secondary | ICD-10-CM | POA: Diagnosis not present

## 2022-10-21 DIAGNOSIS — K6389 Other specified diseases of intestine: Secondary | ICD-10-CM | POA: Diagnosis not present

## 2022-10-21 DIAGNOSIS — Z9049 Acquired absence of other specified parts of digestive tract: Secondary | ICD-10-CM | POA: Diagnosis not present

## 2022-10-29 DIAGNOSIS — Z6825 Body mass index (BMI) 25.0-25.9, adult: Secondary | ICD-10-CM | POA: Diagnosis not present

## 2022-10-29 DIAGNOSIS — M5412 Radiculopathy, cervical region: Secondary | ICD-10-CM | POA: Diagnosis not present

## 2022-12-02 ENCOUNTER — Other Ambulatory Visit: Payer: Self-pay | Admitting: Cardiovascular Disease

## 2022-12-04 ENCOUNTER — Other Ambulatory Visit: Payer: Self-pay | Admitting: Family Medicine

## 2022-12-04 DIAGNOSIS — Z1231 Encounter for screening mammogram for malignant neoplasm of breast: Secondary | ICD-10-CM

## 2022-12-26 ENCOUNTER — Other Ambulatory Visit: Payer: Self-pay | Admitting: Family Medicine

## 2022-12-26 MED ORDER — CLORAZEPATE DIPOTASSIUM 7.5 MG PO TABS: ORAL_TABLET | ORAL | 0 refills | Status: DC

## 2023-01-20 ENCOUNTER — Encounter: Payer: Self-pay | Admitting: Family Medicine

## 2023-01-20 ENCOUNTER — Ambulatory Visit (INDEPENDENT_AMBULATORY_CARE_PROVIDER_SITE_OTHER): Payer: Medicare Other

## 2023-01-20 ENCOUNTER — Ambulatory Visit (INDEPENDENT_AMBULATORY_CARE_PROVIDER_SITE_OTHER): Payer: Medicare Other | Admitting: Family Medicine

## 2023-01-20 VITALS — BP 156/65 | HR 51 | Ht 64.0 in | Wt 148.0 lb

## 2023-01-20 DIAGNOSIS — M25562 Pain in left knee: Secondary | ICD-10-CM

## 2023-01-20 DIAGNOSIS — M25561 Pain in right knee: Secondary | ICD-10-CM

## 2023-01-20 DIAGNOSIS — M1711 Unilateral primary osteoarthritis, right knee: Secondary | ICD-10-CM | POA: Diagnosis not present

## 2023-01-20 NOTE — Progress Notes (Signed)
bilat  Acute Office Visit  Subjective:     Patient ID: Traci [name], female    DOB: Jun 19, 1945, 77 y.o.   MRN: [id number]  Chief Complaint  Patient presents with   Fall    HPI Patient is in today for fall with injury to her knees.  Last Wednesday she feel going up the steps and misstepped.   She feel forward onto both knees.  She has been icing them elevating them using a topical rubs such as IcyHot.  But now 5 days later it still painful to walk so she decided to come in and have them checked out.  She did have significant bruising on the right anterior knee.  She says by the end of the day they are very swollen she has a lot of pain particularly laterally over the joint line and a little bit posteriorly especially by the end of the day.  Prior surgeries to the knees and no hardware in place.  Did let me know that Dr. Durwin Nora her cardiovascular surgeon will actually be retiring soon.  Monitor the aorta and atherosclerosis in other peripheral vessels in her abdomen.  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  ROS      Objective:    BP (!) 156/65   Pulse (!) 51   Ht 5\' 4"  (1.626 m)   Wt 148 lb (67.1 kg)   SpO2 100%   BMI 25.40 kg/m    Physical Exam Vitals reviewed.  Constitutional:      Appearance: She is well-developed.  HENT:     Head: Normocephalic and atraumatic.  Eyes:     Conjunctiva/sclera: Conjunctivae normal.  Cardiovascular:     Rate and Rhythm: Normal rate.  Pulmonary:     Effort: Pulmonary effort is normal.  Musculoskeletal:     Comments: Left upper knee is swollen. Contusion on right anterior lower knee. Tender over lateral joint lines bilaterally.    Skin:    General: Skin is dry.     Coloration: Skin is not pale.  Neurological:     Mental Status: She is alert and oriented to person, place, and time.  Psychiatric:        Behavior: Behavior normal.     No results found for any visits  on 01/20/23.      Assessment & Plan:   Problem List Items Addressed This Visit       Other   Bilateral knee pain - Primary   Relevant Orders   DG Knee Complete 4 Views Right   DG Knee Complete 4 Views Left   Bilateral knee pain-discussed getting x-rays today since she still having significant pain with walking and she has been pretty aggressively icing and elevating her knees at this point.  She is having a lot of lateral pain so suspect she may have some acute synovitis from the injury.  Will start with plain films and then we can go from there if needed.  No prior hardware or old surgeries.  No orders of the defined types were placed in this encounter.   No follow-ups on file.  Nani Gasser, MD

## 2023-01-21 NOTE — Telephone Encounter (Signed)
Added pneumovax 23  given on 01/20/23 to existing immunizations.

## 2023-01-22 ENCOUNTER — Ambulatory Visit: Payer: Medicare Other

## 2023-01-23 ENCOUNTER — Encounter: Payer: Self-pay | Admitting: Family Medicine

## 2023-01-23 NOTE — Progress Notes (Signed)
Hi Traci [name], no sign of fracture on the right knee.  You do have some arthritis in the medial part of the knee and behind the kneecap.  But otherwise looks okay.  For now just continue to ice elevate and can even use an Ace wrap for compression if needed hopefully will improve over the next week or 2 and if not we could consider formal physical therapy or even get you in with a sports med doc.

## 2023-01-23 NOTE — Progress Notes (Signed)
Hi [name], no sign of fracture on the left knee which is great.  There is a little bit of calcification of the quadriceps tendon that is in the upper thigh where it comes and attaches across the knee.  It is possible that that is inflamed.  But again no fracture on that side.

## 2023-01-24 NOTE — Telephone Encounter (Signed)
She may have only seen the results for one of the x-rays but I did put a description for the right and the left as they did come back separately.  But yes I think compression with an Ace wrap and icing as needed and still elevating for 10 to 15 minutes at a time would be really helpful and should hopefully improve over the next 2 weeks and if not then we will get her in with sports med or Ortho.

## 2023-01-31 ENCOUNTER — Encounter: Payer: Self-pay | Admitting: Family Medicine

## 2023-01-31 DIAGNOSIS — M25562 Pain in left knee: Secondary | ICD-10-CM

## 2023-01-31 NOTE — Telephone Encounter (Signed)
Orders Placed This Encounter  Procedures   Ambulatory referral to Orthopedic Surgery    Referral Priority:   Urgent    Referral Type:   Surgical    Referral Reason:   Specialty Services Required    Requested Specialty:   Orthopedic Surgery    Number of Visits Requested:   1

## 2023-02-12 DIAGNOSIS — M25562 Pain in left knee: Secondary | ICD-10-CM | POA: Diagnosis not present

## 2023-02-12 DIAGNOSIS — M25561 Pain in right knee: Secondary | ICD-10-CM | POA: Diagnosis not present

## 2023-02-15 ENCOUNTER — Ambulatory Visit: Payer: Medicare Other

## 2023-02-15 ENCOUNTER — Ambulatory Visit
Admission: EM | Admit: 2023-02-15 | Discharge: 2023-02-15 | Disposition: A | Discharge status: Home / Self Care | Payer: Medicare Other | Attending: Family Medicine | Admitting: Family Medicine

## 2023-02-15 DIAGNOSIS — Z8719 Personal history of other diseases of the digestive system: Secondary | ICD-10-CM

## 2023-02-15 DIAGNOSIS — R1084 Generalized abdominal pain: Secondary | ICD-10-CM | POA: Diagnosis not present

## 2023-02-15 DIAGNOSIS — R109 Unspecified abdominal pain: Secondary | ICD-10-CM | POA: Diagnosis not present

## 2023-02-15 DIAGNOSIS — R6883 Chills (without fever): Secondary | ICD-10-CM

## 2023-02-15 DIAGNOSIS — R110 Nausea: Secondary | ICD-10-CM

## 2023-02-15 DIAGNOSIS — R140 Abdominal distension (gaseous): Secondary | ICD-10-CM | POA: Diagnosis not present

## 2023-02-15 MED ORDER — LEVOFLOXACIN 500 MG PO TABS: 500.0000 mg | ORAL_TABLET | Freq: Every day | ORAL | 0 refills | Status: DC

## 2023-02-15 MED ORDER — ONDANSETRON HCL 8 MG PO TABS: 8.0000 mg | ORAL_TABLET | Freq: Three times a day (TID) | ORAL | 0 refills | Status: DC | PRN

## 2023-02-15 NOTE — ED Provider Notes (Signed)
[name]    CSN: [id number] Arrival date & time: 02/15/23  1107      History   Chief Complaint Chief Complaint  Patient presents with   Chills    Chills, dizziness, nausea and bloating.     HPI Traci [name] is a 77 y.o. female.   Complicated medical patient, on multiple medications.  Suffers from coronary artery disease, chronic kidney disease, vascular disease including mesenteric ischemia, peripheral vascular disease, abdominal aortic aneurysm (repaired) .  has had an appendectomy, hysterectomy, and gallbladder removed.  Known diverticulosis, known irritable bowel syndrome.  Here for abdominal pain and distention.  Also has had some chills and dizziness.  Did a COVID test at home that was negative.  Feels like she cannot eat anything due to nausea.  Last normal bowel movement 2 days ago.    Past Medical History:  Diagnosis Date   Allergy 1995   Contrast dye   Anxiety    Arthritis 2023   Atrial fibrillation (HCC)    CAD (coronary artery disease)    CKD (chronic kidney disease) stage 3, GFR 30-59 ml/min (HCC) 08/04/2017   Claudication (HCC) 01/06/2008   Lower extremity dopplers - no evidence of arterial insufficiency, normal exam   Coronary artery disease    GERD (gastroesophageal reflux disease)    Heart murmur    Hyperlipidemia    Hypertension 11/30/2010   echo- EF 55%; normal w/ mildly sclerotic aortic valve   Hypertension 11/05/2011   renal dopplers - celiac artery and SMA >50% diameter reduction, R renal artery - mildly elevated velocities 1-59% diameter reduction, L renal artery normal   Nonspecific ST-T wave electrocardiographic changes 03/26/2011   R/Lmv - EF 74%, normal perfusion all regions, ST depression w/ Lexiscan infusion w/o assoc angina   Osteopenia    PAD (peripheral artery disease) (HCC)    Peripheral vascular disease (HCC)    PVD (peripheral vascular disease) (HCC) 11/05/2011   doppler - R/L brachial pressures essentially  equal w/o inflow disease; L sublclavian/CCA bypass graft demonstrates patent flow, no evidence of significant stenosis   Sigmoid diverticulitis     Patient Active Problem List   Diagnosis Date Noted   DDD (degenerative disc disease), cervical 06/21/2022   PAT (paroxysmal atrial tachycardia) 03/01/2022   Bilateral knee pain 01/02/2022   Sprain of anterior talofibular ligament of left ankle 09/28/2020   Edema 09/03/2019   Epigastric pain 08/11/2019   Insomnia 08/10/2019   Hypocalcemia 06/03/2019   Atherosclerosis of aorta (HCC) 01/04/2018   Osteoporosis 12/25/2017   CKD (chronic kidney disease) stage 3, GFR 30-59 ml/min (HCC) 08/04/2017   Primary osteoarthritis of first carpometacarpal joint of left hand 07/10/2017   Strain of right Achilles tendon 04/14/2017   Atypical nevus 08/22/2016   Acute bilateral low back pain with left-sided sciatica 06/06/2016   Menopause 12/08/2015   History of colonoscopy 07/17/2015   Irregular heartbeat 07/26/2014   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   Irritable bowel syndrome 09/21/2013   Hyperlipidemia 04/05/2013   Hypertension 12/06/2012   Peripheral arterial disease (HCC) 12/06/2012   CAD, cath 2009 12/06/2012   Hyponatremia, improved holding diuretic 12/06/2012   Lymphocele of left arm 07/14/2012   Atherosclerosis of other specified arteries 04/02/2012   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  CFX, RCA OK.   CATARACT EXTRACTION     CHOLECYSTECTOMY     EYE SURGERY     retinal surgery   SMALL INTESTINE SURGERY     SPINE SURGERY  07/2009   SUBCLAVIAN ARTERY STENT  1995   X's  several   SUBCLAVIAN ARTERY STENT Left 09/20/2008   LSA ISR 7x36mm Cordis Genesis on Opta premount, reduction from 80% to 0%   subclavian artery stents  01/23/2010   Left carotid to subclavian artery Bypass   VISCERAL ANGIOGRAM  01/17/2014   Procedure: VISCERAL ANGIOGRAM;  Surgeon: Chuck Hint, MD;  Location: [facility] CATH LAB;  Service: Cardiovascular;;    OB History   No obstetric history on file.      Home Medications    Prior to Admission medications   Medication Sig Start Date End Date Taking? Authorizing Provider  aspirin EC 81 MG tablet Take 81 mg by mouth daily.   Yes [provider]  atenolol (TENORMIN) 25 MG tablet Take 1 tablet (25 mg total) by mouth daily. 09/09/22  Yes Swinyer, Zachary George, NP  atorvastatin (LIPITOR) 10 MG tablet TAKE 1 TABLET DAILY AT 6 P.M. 07/01/22  Yes Croitoru, Mihai, MD  candesartan (ATACAND) 4 MG tablet Take 1 tablet (4 mg total) by mouth in the morning and at bedtime. Patient taking differently: Take 4 mg by mouth in the morning and at bedtime. Pt reports that she is taking this PRN 05/22/22  Yes Swinyer, Zachary George, NP  cholecalciferol (VITAMIN D) 1000 UNITS tablet Take 1,000 Units by mouth daily.   Yes [provider]  clopidogrel (PLAVIX) 75 MG tablet TAKE 1 TABLET DAILY 07/01/22  Yes Croitoru, Mihai, MD  clorazepate (TRANXENE) 7.5 MG tablet TAKE ONE-HALF (1/2) TO ONE TABLET DAILY AS NEEDED 12/26/22  Yes Agapito Games, MD  cyanocobalamin 1000 MCG tablet Take 1,000 mcg by mouth daily.   Yes [provider]  EPINEPHrine (EPIPEN 2-PAK) 0.3 mg/0.3 mL IJ SOAJ injection Inject 0.3 mLs (0.3 mg total) into the muscle as needed (for allergic reaction). 12/23/17  Yes Rodolph Bong, MD  esomeprazole (NEXIUM) 40 MG capsule Take 1 capsule (40  mg total) by mouth daily. Take 1 tab daily 02/23/15  Yes Croitoru, Mihai, MD  estradiol (ESTRACE) 0.5 MG tablet TAKE 1 TABLET DAILY 04/12/22  Yes Agapito Games, MD  furosemide (LASIX) 20 MG tablet TAKE 1 TABLET EVERY OTHER DAY 12/03/22  Yes Croitoru, Mihai, MD  levofloxacin (LEVAQUIN) 500 MG tablet Take 1 tablet (500 mg total) by mouth daily. 02/15/23  Yes Traci Moore, MD  linaclotide [facility]) 145 MCG CAPS capsule Take 145 mcg by mouth as needed. constipation 12/22/15  Yes [provider]  meclizine (ANTIVERT) 25 MG tablet TAKE ONE-HALF (1/2) TABLET THREE TIMES A DAY AS NEEDED FOR DIZZINESS OR NAUSEA 04/15/22  Yes Agapito Games, MD  Multiple Vitamin (MULTIVITAMIN WITH MINERALS) TABS Take 1 tablet by mouth daily.   Yes [provider]  ondansetron (ZOFRAN) 8 MG tablet Take 1 tablet (8 mg total) by mouth every 8 (eight) hours as needed. 02/15/23  Yes Traci Moore, MD  zolpidem (AMBIEN) 5 MG tablet Take 1 tablet (5  mg total) by mouth at bedtime as needed for sleep. 08/29/22  Yes Agapito Games, MD    Family History Family History  Problem Relation Age of Onset   Heart disease Father 65       Heart Disease before age 18   Kidney disease Father    Heart attack Father    Hyperlipidemia Father    Hypertension Father    Alcohol abuse Father    Kidney failure Mother    Arthritis Mother    Diabetes Maternal Grandmother    Heart disease Paternal Grandfather     Social History Social History   Tobacco Use   Smoking status: Former    Current packs/day: 0.00    Average packs/day: 0.3 packs/day for 20.0 years (5.0 ttl pk-yrs)    Types: Cigarettes    Start date: 09/30/1973    Quit date: 09/30/1993    Years since quitting: 29.3   Smokeless tobacco: Never  Vaping Use   Vaping status: Never Used  Substance Use Topics   Alcohol use: No   Drug use: No     Allergies   Cortisone, Dilaudid [hydromorphone hcl], Iodine, Medrol [methylprednisolone],  Omnipaque [iohexol], Prednisone, Shellfish allergy, Sulfa drugs cross reactors, Z-pak [azithromycin], Doxycycline, Ivp dye [iodinated contrast media], Methylprednisolone sodium succ, Sulfa antibiotics, Augmentin [amoxicillin-pot clavulanate], Codeine, Erythromycin, and Morphine and codeine   Review of Systems Review of Systems  See HPI Physical Exam Triage Vital Signs ED Triage Vitals  Encounter Vitals Group     BP 02/15/23 1119 (!) 200/80     Systolic BP Percentile --      Diastolic BP Percentile --      Pulse Rate 02/15/23 1119 (!) 54     Resp 02/15/23 1119 17     Temp 02/15/23 1119 98.4 F (36.9 C)     Temp Source 02/15/23 1119 Oral     SpO2 02/15/23 1119 97 %     Weight 02/15/23 1117 148 lb 8 oz (67.4 kg)     Height 02/15/23 1117 5\' 4"  (1.626 m)     Head Circumference --      Peak Flow --      Pain Score 02/15/23 1116 0     Pain Loc --      Pain Education --      Exclude from Growth Chart --    No data found.  Updated Vital Signs BP (!) 159/72 (BP Location: Right Arm)   Pulse (!) 53   Temp 98.4 F (36.9 C) (Oral)   Resp 17   Ht 5\' 4"  (1.626 m)   Wt 67.4 kg   SpO2 97%   BMI 25.49 kg/m  :     Physical Exam Constitutional:      General: She is not in acute distress.    Appearance: She is well-developed.  HENT:     Head: Normocephalic and atraumatic.  Eyes:     Conjunctiva/sclera: Conjunctivae normal.     Pupils: Pupils are equal, round, and reactive to light.  Cardiovascular:     Rate and Rhythm: Normal rate and regular rhythm.     Heart sounds: Normal heart sounds.  Pulmonary:     Effort: Pulmonary effort is normal. No respiratory distress.     Breath sounds: Normal breath sounds.  Abdominal:     General: Bowel sounds are normal. There is no distension.     Palpations: Abdomen is soft.     Tenderness: There is abdominal tenderness. There is no guarding  or rebound.     Comments: Patient has diffuse abdominal tenderness.  Abdomen is soft, normal bowel  sounds.  Tenderness is most pronounced in left lower quadrant.  No guarding or rebound.  No organomegaly.  No mass  Musculoskeletal:        General: Normal range of motion.     Cervical back: Normal range of motion.  Lymphadenopathy:     Cervical: No cervical adenopathy.  Skin:    General: Skin is warm and dry.  Neurological:     Mental Status: She is alert.      UC Treatments / Results  Labs (all labs ordered are listed, but only abnormal results are displayed) Labs Reviewed - No data to display  EKG   Radiology DG Abdomen 1 View  Result Date: 02/15/2023 CLINICAL DATA:  Pain, chills, nausea, and bloating. EXAM: ABDOMEN - 1 VIEW COMPARISON:  CT abdomen 11/23/2020 FINDINGS: The bowel gas pattern appears nonobstructed. No dilated loops of large or small bowel. No abnormal abdominal or pelvic calcifications. Postop changes again noted at the L5-S1 level. IMPRESSION: Nonobstructive bowel gas pattern. Electronically Signed   By: Signa Kell M.D.   On: 02/15/2023 12:03    Procedures Procedures (including critical care time)  Medications Ordered in UC Medications - No data to display  Initial Impression / Assessment and Plan / UC Course  I have reviewed the triage vital signs and the nursing notes.  Pertinent labs & imaging results that were available during my care of the patient were reviewed by me and considered in my medical decision making (see chart for details).     Patient is a retired Char[employer] fundraiser as the pitfalls of diagnosing abdominal pain without labs or scans.  Last time she had pain like this she did well with antibiotics.  I am going to give her the same antibiotic for presumed diverticulitis.  She understands that if she gets worse instead of better she must go to an emergency room Final Clinical Impressions(s) / UC Diagnoses   Final diagnoses:  Generalized abdominal pain  History of colonic diverticulitis     Discharge Instructions      Lots of clear  liquids Take antibiotic daily Take Zofran as needed for nausea As we discussed go to the emergency room if you get worse     ED Prescriptions     Medication Sig Dispense Auth. Provider   ondansetron (ZOFRAN) 8 MG tablet Take 1 tablet (8 mg total) by mouth every 8 (eight) hours as needed. 20 tablet Traci Moore, MD   levofloxacin (LEVAQUIN) 500 MG tablet Take 1 tablet (500 mg total) by mouth daily. 7 tablet Traci Moore, MD      PDMP not reviewed this encounter.   Traci Moore, MD 02/15/23 1323

## 2023-02-15 NOTE — ED Triage Notes (Signed)
Pt states that she also has some abdominal pain.  

## 2023-02-15 NOTE — Discharge Instructions (Signed)
Lots of clear liquids Take antibiotic daily Take Zofran as needed for nausea As we discussed go to the emergency room if you get worse

## 2023-02-15 NOTE — ED Triage Notes (Signed)
Pt states that she has chills, dizziness, nausea and bloating. X3 -4days

## 2023-02-19 DIAGNOSIS — I700 Atherosclerosis of aorta: Secondary | ICD-10-CM | POA: Diagnosis not present

## 2023-02-19 DIAGNOSIS — Z87891 Personal history of nicotine dependence: Secondary | ICD-10-CM | POA: Diagnosis not present

## 2023-02-19 DIAGNOSIS — K5732 Diverticulitis of large intestine without perforation or abscess without bleeding: Secondary | ICD-10-CM | POA: Diagnosis not present

## 2023-02-19 DIAGNOSIS — K5792 Diverticulitis of intestine, part unspecified, without perforation or abscess without bleeding: Secondary | ICD-10-CM | POA: Diagnosis not present

## 2023-02-19 DIAGNOSIS — R509 Fever, unspecified: Secondary | ICD-10-CM | POA: Diagnosis not present

## 2023-02-24 ENCOUNTER — Telehealth: Payer: Self-pay | Admitting: Family Medicine

## 2023-02-24 NOTE — Telephone Encounter (Signed)
Patient called in stating that levofloxacin (LEVAQUIN) 750 MG tablet is not making her feel better ad wants to know what she can do about this. Please advise

## 2023-02-25 DIAGNOSIS — K5792 Diverticulitis of intestine, part unspecified, without perforation or abscess without bleeding: Secondary | ICD-10-CM | POA: Diagnosis not present

## 2023-02-25 DIAGNOSIS — R1032 Left lower quadrant pain: Secondary | ICD-10-CM | POA: Diagnosis not present

## 2023-02-28 NOTE — Telephone Encounter (Signed)
Please call patient on Monday and see how she is feeling.  Like she saw GI and was switched to Augmentin.  Sorry for the delay in getting back to her.  But see how she is feeling.

## 2023-03-03 ENCOUNTER — Encounter: Payer: Self-pay | Admitting: Family Medicine

## 2023-03-03 ENCOUNTER — Ambulatory Visit (INDEPENDENT_AMBULATORY_CARE_PROVIDER_SITE_OTHER): Payer: Medicare Other | Admitting: Family Medicine

## 2023-03-03 VITALS — BP 134/58 | HR 57 | Ht 64.0 in | Wt 144.0 lb

## 2023-03-03 DIAGNOSIS — G4700 Insomnia, unspecified: Secondary | ICD-10-CM

## 2023-03-03 DIAGNOSIS — K5792 Diverticulitis of intestine, part unspecified, without perforation or abscess without bleeding: Secondary | ICD-10-CM | POA: Diagnosis not present

## 2023-03-03 DIAGNOSIS — N1831 Chronic kidney disease, stage 3a: Secondary | ICD-10-CM | POA: Diagnosis not present

## 2023-03-03 DIAGNOSIS — Z23 Encounter for immunization: Secondary | ICD-10-CM | POA: Diagnosis not present

## 2023-03-03 MED ORDER — ZOLPIDEM TARTRATE 5 MG PO TABS
5.0000 mg | ORAL_TABLET | Freq: Every evening | ORAL | 1 refills | Status: DC | PRN
Start: 2023-03-03 — End: 2023-08-29

## 2023-03-03 MED ORDER — CLORAZEPATE DIPOTASSIUM 7.5 MG PO TABS: ORAL_TABLET | ORAL | 0 refills | Status: DC

## 2023-03-03 NOTE — Progress Notes (Signed)
Established Patient Office Visit  Subjective   Patient ID: Traci [name], female    DOB: 12/01/1945  Age: 77 y.o. MRN: [id number]  Chief Complaint  Patient presents with   Hypertension    HPI  Hypertension- Pt denies chest pain, SOB, dizziness, or heart palpitations.  Taking meds as directed w/o problems.  Denies medication side effects.    Saw GI on September 10 for diverticulitis follow-up.  She has been slowly improving from her recent episode of diverticulitis earlier in September.  She just finished up a 10-day course of Augmentin and Cipro on Saturday so she has been off for 2 days now.  They had discontinued the levofloxacin because of side effects.  Encouraged her to continue with a low fiber soft GI diet for the next 10 to 14 days.  Initially placed on Levaquin when she first went to urgent care before going to the emergency room she ended up going to the ED after being on the antibody for couple days and then running a fever of 101.  The Levaquin caused her to have numbness and tingling in her fingertips and feeling lightheaded.  CT in the ED confirms sigmoid diverticulitis without evidence of perforation or abscess.  Going to schedule her for virtual colonoscopy most likely next month.  She was noted to have advanced aortoiliac atherosclerotic disease which is known.  Hospital and UC notes reviewed.     ROS    Objective:     BP (!) 134/58   Pulse (!) 57   Ht 5\' 4"  (1.626 m)   Wt 144 lb (65.3 kg)   SpO2 99%   BMI 24.72 kg/m    Physical Exam Vitals and nursing note reviewed.  Constitutional:      Appearance: Normal appearance.  HENT:     Head: Normocephalic and atraumatic.  Eyes:     Conjunctiva/sclera: Conjunctivae normal.  Cardiovascular:     Rate and Rhythm: Normal rate and regular rhythm.  Pulmonary:     Effort: Pulmonary effort is normal.     Breath sounds: Normal breath sounds.  Skin:    General: Skin is warm and dry.  Neurological:      Mental Status: She is alert.  Psychiatric:        Mood and Affect: Mood normal.      No results found for any visits on 03/03/23.    The ASCVD Risk score (Arnett DK, et al., 2019) failed to calculate for the following reasons:   The valid total cholesterol range is 130 to 320 mg/dL   Unable to determine if patient is Non-Hispanic African American    Assessment & Plan:   Problem List Items Addressed This Visit       Genitourinary   CKD (chronic kidney disease) stage 3, GFR 30-59 ml/min (HCC)    Continue to follow renal function every 6months.   Lab Results  Component Value Date   CREATININE 0.95 08/14/2022   Cr was 1.1 in the ED.          Other   Insomnia    Tolerating ambien well. Will refill for 6 months.       Relevant Medications   zolpidem (AMBIEN) 5 MG tablet   Diverticulitis - Primary    She unfortunately has had recurrent diverticulitis.  She cannot tolerate metronidazole and did not do well with Levaquin.  She is finished with her antibiotics as of Saturday and is feeling somewhat better she is able to tolerate  soft foods at this point and is just gently advancing her diet.  She says this was the worst episode she has ever had.      Other Visit Diagnoses     Encounter for immunization       Relevant Orders   Flu Vaccine Trivalent High Dose (Fluad) (Completed)       Return in about 6 weeks (around 04/14/2023) for Wellness Exam.   I spent 40 minutes on the day of the encounter to include pre-visit record review, face-to-face time with the patient and post visit ordering of test.   Nani Gasser, MD

## 2023-03-03 NOTE — Assessment & Plan Note (Signed)
Continue to follow renal function every 6months.   Lab Results  Component Value Date   CREATININE 0.95 08/14/2022   Cr was 1.1 in the ED.

## 2023-03-03 NOTE — Assessment & Plan Note (Signed)
Tolerating ambien well. Will refill for 6 months.

## 2023-03-03 NOTE — Assessment & Plan Note (Signed)
She unfortunately has had recurrent diverticulitis.  She cannot tolerate metronidazole and did not do well with Levaquin.  She is finished with her antibiotics as of Saturday and is feeling somewhat better she is able to tolerate soft foods at this point and is just gently advancing her diet.  She says this was the worst episode she has ever had.

## 2023-03-05 ENCOUNTER — Ambulatory Visit (INDEPENDENT_AMBULATORY_CARE_PROVIDER_SITE_OTHER): Payer: Medicare Other

## 2023-03-05 DIAGNOSIS — Z1231 Encounter for screening mammogram for malignant neoplasm of breast: Secondary | ICD-10-CM

## 2023-03-06 NOTE — Progress Notes (Signed)
HI [name],based on your mammo they are recommending Diagnostic mammogram and possibly ultrasound of the right breast.  Imaging will contact you soon to schedule.

## 2023-03-07 ENCOUNTER — Other Ambulatory Visit: Payer: Self-pay | Admitting: Family Medicine

## 2023-03-07 DIAGNOSIS — R928 Other abnormal and inconclusive findings on diagnostic imaging of breast: Secondary | ICD-10-CM

## 2023-03-13 ENCOUNTER — Ambulatory Visit
Admission: RE | Admit: 2023-03-13 | Discharge: 2023-03-13 | Disposition: A | Discharge status: Home / Self Care | Payer: Medicare Other | Source: Ambulatory Visit | Attending: Family Medicine | Admitting: Family Medicine

## 2023-03-13 ENCOUNTER — Other Ambulatory Visit: Payer: Self-pay | Admitting: Family Medicine

## 2023-03-13 DIAGNOSIS — R928 Other abnormal and inconclusive findings on diagnostic imaging of breast: Secondary | ICD-10-CM

## 2023-03-13 DIAGNOSIS — N6310 Unspecified lump in the right breast, unspecified quadrant: Secondary | ICD-10-CM

## 2023-03-13 DIAGNOSIS — N6341 Unspecified lump in right breast, subareolar: Secondary | ICD-10-CM | POA: Diagnosis not present

## 2023-03-14 ENCOUNTER — Encounter: Payer: Self-pay | Admitting: Family Medicine

## 2023-03-18 ENCOUNTER — Ambulatory Visit
Admission: RE | Admit: 2023-03-18 | Discharge: 2023-03-18 | Disposition: A | Discharge status: Home / Self Care | Payer: Medicare Other | Source: Ambulatory Visit | Attending: Family Medicine | Admitting: Family Medicine

## 2023-03-18 DIAGNOSIS — N6315 Unspecified lump in the right breast, overlapping quadrants: Secondary | ICD-10-CM | POA: Diagnosis not present

## 2023-03-18 DIAGNOSIS — R928 Other abnormal and inconclusive findings on diagnostic imaging of breast: Secondary | ICD-10-CM

## 2023-03-18 DIAGNOSIS — N6310 Unspecified lump in the right breast, unspecified quadrant: Secondary | ICD-10-CM

## 2023-03-18 DIAGNOSIS — D241 Benign neoplasm of right breast: Secondary | ICD-10-CM | POA: Diagnosis not present

## 2023-03-18 HISTORY — PX: BREAST BIOPSY: SHX20

## 2023-03-19 LAB — SURGICAL PATHOLOGY

## 2023-04-15 DIAGNOSIS — K5792 Diverticulitis of intestine, part unspecified, without perforation or abscess without bleeding: Secondary | ICD-10-CM | POA: Diagnosis not present

## 2023-04-15 DIAGNOSIS — R1032 Left lower quadrant pain: Secondary | ICD-10-CM | POA: Diagnosis not present

## 2023-04-15 DIAGNOSIS — K5904 Chronic idiopathic constipation: Secondary | ICD-10-CM | POA: Diagnosis not present

## 2023-04-16 ENCOUNTER — Other Ambulatory Visit: Payer: Self-pay | Admitting: Family Medicine

## 2023-04-24 ENCOUNTER — Other Ambulatory Visit: Payer: Self-pay | Admitting: Family Medicine

## 2023-04-29 ENCOUNTER — Other Ambulatory Visit: Payer: Self-pay | Admitting: Nurse Practitioner

## 2023-04-30 DIAGNOSIS — K5792 Diverticulitis of intestine, part unspecified, without perforation or abscess without bleeding: Secondary | ICD-10-CM | POA: Diagnosis not present

## 2023-04-30 DIAGNOSIS — Z1330 Encounter for screening examination for mental health and behavioral disorders, unspecified: Secondary | ICD-10-CM | POA: Diagnosis not present

## 2023-05-13 ENCOUNTER — Ambulatory Visit: Payer: Medicare Other | Attending: Cardiovascular Disease | Admitting: Cardiovascular Disease

## 2023-05-13 ENCOUNTER — Encounter: Payer: Self-pay | Admitting: Cardiovascular Disease

## 2023-05-13 VITALS — BP 165/66 | HR 58 | Ht 64.0 in | Wt 147.0 lb

## 2023-05-13 DIAGNOSIS — Z01810 Encounter for preprocedural cardiovascular examination: Secondary | ICD-10-CM | POA: Diagnosis not present

## 2023-05-13 DIAGNOSIS — I25118 Atherosclerotic heart disease of native coronary artery with other forms of angina pectoris: Secondary | ICD-10-CM | POA: Diagnosis not present

## 2023-05-13 DIAGNOSIS — I739 Peripheral vascular disease, unspecified: Secondary | ICD-10-CM

## 2023-05-13 DIAGNOSIS — E7800 Pure hypercholesterolemia, unspecified: Secondary | ICD-10-CM | POA: Diagnosis not present

## 2023-05-13 DIAGNOSIS — I771 Stricture of artery: Secondary | ICD-10-CM | POA: Diagnosis not present

## 2023-05-13 DIAGNOSIS — K551 Chronic vascular disorders of intestine: Secondary | ICD-10-CM

## 2023-05-13 DIAGNOSIS — I700 Atherosclerosis of aorta: Secondary | ICD-10-CM

## 2023-05-13 DIAGNOSIS — R072 Precordial pain: Secondary | ICD-10-CM | POA: Diagnosis not present

## 2023-05-13 DIAGNOSIS — I10 Essential (primary) hypertension: Secondary | ICD-10-CM

## 2023-05-13 DIAGNOSIS — I4719 Other supraventricular tachycardia: Secondary | ICD-10-CM

## 2023-05-13 NOTE — Progress Notes (Signed)
Patient ID: Traci [name], female   DOB: [birth year], 77 y.o.   MRN: [id number]    Cardiology Office Note    Date:  05/13/2023   ID:  Traci [name], DOB 1945/08/02, MRN [id number]  PCP:  Agapito Games, MD  Cardiologist:   Thurmon Fair, MD   Chief Complaint  Patient presents with   Pre-op Exam    History of Present Illness:  Traci [name] is a 77 y.o. female with an extensive history of peripheral arterial disease involving the mesenteric circulation and upper extremities, mild nonobstructive coronary atherosclerosis, hyperlipidemia, hypertension.  She was hospitalized in late August and again in September with acute diverticulitis.  She is planning to undergo sigmoid colectomy with Dr. Ulysees Barns at [facility] in January.  She has had some lower left chest pain.  This usually happens at night and does not happen when she is physically active.  It does not appear to be associated with meals.  She does not have problems with shortness of breath, palpitations, dizziness or syncope.  She has bilateral calf discomfort with walking, worse on the left, but she only complains of it when she is walk for longer distances such as half a mile.  No evidence of AAA on abdominal ultrasound 08/14/2022.  She had normal bilateral ABI 10/15/2022.  Lower extremity arterial duplex ultrasound shows stable 30-50% stenoses in the proximal common femoral arteries bilaterally without SFA stenosis and with three-vessel runoff bilaterally.  Duplex ultrasound performed in December 2022 showed widely patent left subclavian artery bypass graft and no significant stenosis in the carotids.She had a duplex ultrasound of her mesenteric circulation on 09/05/2022 showing normal celiac artery, patent aorta to superior mesenteric artery bypass.  She is planning to reestablish follow-up in the vascular clinic with Dr. Sherral Hammers after Dr. Adele Dan retirement.  She would like to be  reevaluated before her planned surgery in January.  Her blood pressure is a little high today, but she confidently reports that at home her blood pressure will decrease to the 130s/60s once her blood pressure medicine kicks in.  The arrhythmia monitor that she wore last December 2023 showed normal sinus rhythm with some blunting of circadian variation (likely due to beta-blocker therapy) and very brief episodes of nonsustained ectopic atrial tachycardia (maximum 12 seconds).  She does not do well on doses of atenolol higher than 25 mg/day.  Echocardiogram November 2023 showed normal left ventricular systolic function with EF 66% and normal regional wall motion, normal global longitudinal strain at -23.4%, grade 2 diastolic dysfunction, no significant valvular abnormalities.  I reviewed her echo.  Although she has left atrial dilation, the mitral annulus diastolic velocities actually pretty good, I am not sure if the diagnosis of diastolic dysfunction is accurate.   She had a normal nuclear stress test in 2020 in [place]  and in March 2022 in [place].    Her most recent hemoglobin A1c in March of this year was 6.2%.  She underwent a lipid profile 04/25/2023 that showed total cholesterol 158, HDL 42, LDL 92, triglycerides 133.  Last year her LDL was even better at 53.  She reports that she had anaphylaxis in response to contrast administration in the past but that she is also allergic to Solu-Medrol and prednisone which make her "pass out".  Cardiac catheterization performed with exclusively Benadryl as premedication occurred without adverse events.  Traci [name] has long-standing history of peripheral arterial disease with previous stents and surgical revascularization of the left subclavian artery for  subclavian steal syndrome and presyncope. She eventually underwent a left carotid to subclavian bypass.   She underwent a bypass from the aorta to the chronically totally occluded  superficial mesenteric artery due to symptoms of chronic mesenteric ischemia, (Dr. Edilia Bo August 2015  6 mm Dacron graft) She had moderate CAD by coronary angiography performed in 2009. Normal left ventricular systolic function and no perfusion abnormalities by echo 2015 and nuclear stress test performed in 2014 and  October 2017. She had carotid duplex ultrasonography last performed in March 2019, without evidence of significant obstruction. She has a left carotid-subclavian bypass graft without evidence of stenosis. The aortic SMA bypass was noted on abdominal CT performed in July 2018, although no comment was made about its patency. There was no evidence of aortic aneurysm and no comment made about the renal arteries.   Past Medical History:  Diagnosis Date   Allergy 1995   Contrast dye   Anxiety    Arthritis 2023   Atrial fibrillation (HCC)    CAD (coronary artery disease)    CKD (chronic kidney disease) stage 3, GFR 30-59 ml/min (HCC) 08/04/2017   Claudication (HCC) 01/06/2008   Lower extremity dopplers - no evidence of arterial insufficiency, normal exam   Coronary artery disease    GERD (gastroesophageal reflux disease)    Heart murmur    Hyperlipidemia    Hypertension 11/30/2010   echo- EF 55%; normal w/ mildly sclerotic aortic valve   Hypertension 11/05/2011   renal dopplers - celiac artery and SMA >50% diameter reduction, R renal artery - mildly elevated velocities 1-59% diameter reduction, L renal artery normal   Nonspecific ST-T wave electrocardiographic changes 03/26/2011   R/Lmv - EF 74%, normal perfusion all regions, ST depression w/ Lexiscan infusion w/o assoc angina   Osteopenia    PAD (peripheral artery disease) (HCC)    Peripheral vascular disease (HCC)    PVD (peripheral vascular disease) (HCC) 11/05/2011   doppler - R/L brachial pressures essentially equal w/o inflow disease; L sublclavian/CCA bypass graft demonstrates patent flow, no evidence of significant  stenosis   Sigmoid diverticulitis     Past Surgical History:  Procedure Laterality Date   ABDOMINAL AORTIC ANEURYSM REPAIR N/A 01/27/2014   Procedure: AORTO-SUPERIOR MESENTERIC ARTERY BYPASS GRAFT;  Surgeon: Chuck Hint, MD;  Location: [facility];  Service: Vascular;  Laterality: N/A;   ABDOMINAL HYSTERECTOMY     APPENDECTOMY     BREAST BIOPSY Right 03/18/2023   [place] RT BREAST BX W LOC DEV 1ST LESION IMG BX SPEC US GUIDE 03/18/2023 [facility] MAMMOGRAPHY   CARDIAC CATHETERIZATION  06/03/2008   60% LAD, involving D2, borderline significant by IVUS, medical therapy. CFX, RCA OK.   CATARACT EXTRACTION     CHOLECYSTECTOMY     EYE SURGERY     retinal surgery   SMALL INTESTINE SURGERY     SPINE SURGERY  07/2009   SUBCLAVIAN ARTERY STENT  1995   X's  several   SUBCLAVIAN ARTERY STENT Left 09/20/2008   LSA ISR 7x71mm Cordis Genesis on Opta premount, reduction from 80% to 0%   subclavian artery stents  01/23/2010   Left carotid to subclavian artery Bypass   VISCERAL ANGIOGRAM  01/17/2014   Procedure: VISCERAL ANGIOGRAM;  Surgeon: Chuck Hint, MD;  Location: [facility] CATH LAB;  Service: Cardiovascular;;    Outpatient Medications Prior to Visit  Medication Sig Dispense Refill   aspirin EC 81 MG tablet Take 81 mg by mouth daily.     atenolol (TENORMIN)  25 MG tablet Take 1 tablet (25 mg total) by mouth daily. 90 tablet 2   atorvastatin (LIPITOR) 10 MG tablet TAKE 1 TABLET DAILY AT 6 P.M. 90 tablet 3   candesartan (ATACAND) 4 MG tablet TAKE 1 TABLET IN THE MORNING AND AT BEDTIME 180 tablet 3   cholecalciferol (VITAMIN D) 1000 UNITS tablet Take 1,000 Units by mouth daily.     clopidogrel (PLAVIX) 75 MG tablet TAKE 1 TABLET DAILY 90 tablet 3   clorazepate (TRANXENE) 7.5 MG tablet Take 7.5 mg by mouth daily at 6 (six) AM.     cyanocobalamin 1000 MCG tablet Take 1,000 mcg by mouth daily.     esomeprazole (NEXIUM) 40 MG capsule Take 1 capsule (40 mg total) by mouth daily. Take 1 tab daily 90  capsule 0   estradiol (ESTRACE) 0.5 MG tablet TAKE 1 TABLET DAILY 90 tablet 3   furosemide (LASIX) 20 MG tablet TAKE 1 TABLET EVERY OTHER DAY (Patient taking differently: Take 20 mg by mouth 3 (three) times a week.) 45 tablet 3   linaclotide (LINZESS) 145 MCG CAPS capsule Take 145 mcg by mouth as needed. constipation     meclizine (ANTIVERT) 25 MG tablet TAKE ONE-HALF (1/2) TABLET THREE TIMES A DAY AS NEEDED FOR DIZZINESS OR NAUSEA 60 tablet 8   Multiple Vitamin (MULTIVITAMIN WITH MINERALS) TABS Take 1 tablet by mouth daily.     zolpidem (AMBIEN) 5 MG tablet Take 1 tablet (5 mg total) by mouth at bedtime as needed for sleep. 90 tablet 1   clorazepate (TRANXENE) 7.5 MG tablet TAKE ONE-HALF (1/2) TO ONE TABLET DAILY AS NEEDED (Patient not taking: Reported on 05/13/2023) 85 tablet 0   EPINEPHrine (EPIPEN 2-PAK) 0.3 mg/0.3 mL IJ SOAJ injection Inject 0.3 mLs (0.3 mg total) into the muscle as needed (for allergic reaction). (Patient not taking: Reported on 05/13/2023) 2 Device 0   No facility-administered medications prior to visit.     Allergies:   Cortisone, Dilaudid [hydromorphone hcl], Iodine, Medrol [methylprednisolone], Omnipaque [iohexol], Prednisone, Shellfish allergy, Sulfa drugs cross reactors, Z-pak [azithromycin], Doxycycline, Ivp dye [iodinated contrast media], Levaquin [levofloxacin], Methylprednisolone sodium succ, Sulfa antibiotics, Augmentin [amoxicillin-pot clavulanate], Codeine, Erythromycin, and Morphine and codeine   Social History   Socioeconomic History   Marital status: Widowed    Spouse name: Not on file   Number of children: 1   Years of education: 65   Highest education level: Associate degree: academic program  Occupational History   Occupation: retired    Comment: Engineer, civil (consulting)  Tobacco Use   Smoking status: Former    Current packs/day: 0.00    Average packs/day: 0.3 packs/day for 20.0 years (5.0 ttl pk-yrs)    Types: Cigarettes    Start date: 09/30/1973    Quit date:  09/30/1993    Years since quitting: 29.6   Smokeless tobacco: Never  Vaping Use   Vaping status: Never Used  Substance and Sexual Activity   Alcohol use: No   Drug use: No   Sexual activity: Not Currently  Other Topics Concern   Not on file  Social History Narrative   Lives with her son. Likes to go dancing and likes play online games such as scrabble.    Social Determinants of Health   Financial Resource Strain: Low Risk  (09/06/2022)   Received from [facility], [facility]   Overall Financial Resource Strain (CARDIA)    Difficulty of Paying Living Expenses: Not hard at all  Food Insecurity: No Food Insecurity (09/06/2022)  Received from [facility], [facility]   Hunger Vital Sign    Worried About Running Out of Food in the Last Year: Never true    Ran Out of Food in the Last Year: Never true  Transportation Needs: No Transportation Needs (09/06/2022)   Received from [facility], [facility]   [facility] - Transportation    Lack of Transportation (Medical): No    Lack of Transportation (Non-Medical): No  Physical Activity: Inactive (01/20/2023)   Exercise Vital Sign    Days of Exercise per Week: 0 days    Minutes of Exercise per Session: 0 min  Stress: No Stress Concern Present (09/06/2022)   Received from [facility], [facility] - Occupational Stress Questionnaire    Feeling of Stress : Only a little  Social Connections: Moderately Integrated (09/06/2022)   Received from [facility], [facility]   Social Network    How would you rate your social network (family, work, friends)?: Adequate participation with social networks  Recent Concern: Social Connections - Moderately Isolated (08/09/2022)   Social Connection and Isolation Panel [NHANES]    Frequency of Communication with Friends and Family: Three times a week    Frequency of Social Gatherings with Friends and Family: Twice a week    Attends Religious Services:  Never    Database administrator or Organizations: No    Attends Engineer, structural: 1 to 4 times per year    Marital Status: Widowed     Family History:  The patient's family history includes Alcohol abuse in her father; Arthritis in her mother; Diabetes in her maternal grandmother; Heart attack in her father; Heart disease in her paternal grandfather; Heart disease (age of onset: 10) in her father; Hyperlipidemia in her father; Hypertension in her father; Kidney disease in her father; Kidney failure in her mother.   ROS:   Please see the history of present illness.    ROS All other systems are reviewed and are negative.   PHYSICAL EXAM:   VS:  BP (!) 165/66   Pulse (!) 58   Ht 5\' 4"  (1.626 m)   Wt 147 lb (66.7 kg)   SpO2 90%   BMI 25.23 kg/m    There is no difference in blood pressures between the right and left upper extremities.   General: Alert, oriented x3, no distress Head: no evidence of trauma, PERRL, EOMI, no exophtalmos or lid lag, no myxedema, no xanthelasma; normal ears, nose and oropharynx Neck: normal jugular venous pulsations and no hepatojugular reflux; brisk carotid pulses without delay and no carotid bruits Chest: clear to auscultation, no signs of consolidation by percussion or palpation, normal fremitus, symmetrical and full respiratory excursions Cardiovascular: normal position and quality of the apical impulse, regular rhythm, normal first and second heart sounds, 1/6 aortic ejection murmur is early peaking, no diastolic murmurs, rubs or gallops Abdomen: no tenderness or distention, no masses by palpation, no abnormal pulsatility or arterial bruits, normal bowel sounds, no hepatosplenomegaly Extremities: no clubbing, cyanosis or edema; 2+ radial, ulnar and brachial pulses bilaterally; 2+ right femoral, posterior tibial and dorsalis pedis pulses; 2+ left femoral, posterior tibial and dorsalis pedis pulses; no subclavian or femoral bruits Neurological:  grossly nonfocal Psych: Normal mood and affect   HYPERTENSION CONTROL Vitals:   05/13/23 0859 05/13/23 0941  BP: (!) 168/68 (!) 165/66    The patient's blood pressure is elevated above target today.  In order to address the  patient's elevated BP: Blood pressure will be monitored at home to determine if medication changes need to be made.       Wt Readings from Last 3 Encounters:  05/13/23 147 lb (66.7 kg)  03/03/23 144 lb (65.3 kg)  02/15/23 148 lb 8 oz (67.4 kg)    Studies/Labs Reviewed:   EKG:    EKG Interpretation Date/Time:  Tuesday May 13 2023 09:04:01 EST Ventricular Rate:  58 PR Interval:  162 QRS Duration:  82 QT Interval:  430 QTC Calculation: 422 R Axis:   25  Text Interpretation: Sinus bradycardia When compared with ECG of 14-Aug-2022 09:58, PREVIOUS ECG IS PRESENT Confirmed by Chi Garlow ([id number]) on 05/13/2023 9:18:38 AM          Echocardiogram 05/16/2022:   1. Left ventricular ejection fraction, by estimation, is 65 to 70%. Left  ventricular ejection fraction by 3D volume is 66 %. The left ventricle has  normal function. The left ventricle has no regional wall motion  abnormalities. Left ventricular diastolic   parameters are consistent with Grade II diastolic dysfunction  (pseudonormalization). The average left ventricular global longitudinal  strain is -23.4 %. The global longitudinal strain is normal.   2. Right ventricular systolic function is normal. The right ventricular  size is normal. There is normal pulmonary artery systolic pressure.   3. Left atrial size was severely dilated.   4. The mitral valve is degenerative. Trivial mitral valve regurgitation.  No evidence of mitral stenosis.   5. The aortic valve is normal in structure. Aortic valve regurgitation is  not visualized. No aortic stenosis is present.   6. The inferior vena cava is normal in size with greater than 50%  respiratory variability, suggesting right atrial  pressure of 3 mmHg.    Duplex carotid ultrasound 05/31/2021: Left Graft #1: +--------------------+---++++ Inflow              114 +--------------------+---++++ Proximal Anastomosis136 +--------------------+---++++ Proximal Graft      122 +--------------------+---++++ Mid Graft           177 +--------------------+---++++ Distal Graft        369 +--------------------+---++++ Distal Anastamosis  334 +--------------------+---++++     Summary: Right Carotid: Velocities in the right ICA are consistent with a 1-39% stenosis.  Left Carotid: Velocities in the left ICA are consistent with a 1-39% stenosis.  Vertebrals:  Bilateral vertebral arteries demonstrate antegrade flow. Subclavians: Normal flow hemodynamics were seen in the right subclavian artery.              Patent left subclavian artery bypass graft.   Arrhythmia monitor 07/06/2021:  The dominant rhythm is mild sinus bradycardia with markedly blunted circadian variation There are very rare episodes of brief nonsustained atrial tachycardia (4-7 beats) and rare PACs. There is no atrial fibrillation. There is no evidence of significant ventricular arrhythmia. There are no episodes of severe bradycardia or pauses.   Abnormal arrhythmia monitor to the presence of mild sinus bradycardia markedly blunted chronotropic response.  At least in part this is due to treatment with beta-blockers Palpitations are probably due to PACs and rare brief episodes of paroxysmal atrial tachycardia.     Lexiscan Myoview 01/13/2019: The left ventricular ejection fraction is hyperdynamic (>65%). Nuclear stress EF: 71%. There was no ST segment deviation noted during stress. T wave inversion was noted during stress in the II, III, aVF, V3, V4, V5 and V6 leads, beginning at 30 seconds of stress, and returning to baseline after less than 1  min of recovery. This is a low risk study.   No ischemia or infarction  noted.  There is significant tracer uptake in the bowel which may impact the interpretation of the inferior wall which appears to have normal perfusion. Wall motion is normal, therefore perfusion is likely normal.    Consider performing future studies on the D-Spect (supine and upright) camera.    Abdominal aortic ultrasound [facility] 09/12/2020 IMPRESSION:  Visualization of superior mesenteric artery is limited, however the proximal aspect is appears to be patent. It is unclear if the entire graft is visualized. A CT scan of the abdomen and pelvis with intravenous contrast would be helpful in further evaluation.   Vascular:  - Proximal abdominal aorta measures: 2.1 cm AP x 2.2 cm in width.  - Mid abdominal aorta measures 1.4 cm AP x 1.5 cm in width.    - Distal abdominal aorta measures 1.2 cm AP x 1.2 cm in width.   Blood flow is documented in the normal direction in the abdominal aorta.   - Right common iliac artery measures 0.7 cm AP.  - Left common iliac artery measures 0.7 cm AP.   - Proximal aspect of the superior mesenteric artery was identified and is patent. The remainder of the SMA was not visualized due to bowel gas.   Miscellaneous:  - Left kidneys are normal in size, with the right kidney measuring up to 9.2 cm and the left measuring up to 9.7 cm.  Lower extremity arterial Dopplers 11/06/2020 Summary:  Right ABI 1.13, left ABI 1.14  Right: Minimum atherosclerosis noted.  30-49% stenosis in the proximal CFA.  30-495 stenosis in the TPT, low end range.  Three vessel run-off, with 30-49% stenosis noted in the mid and distal PTA, low end range.   Left: Minimum atherosclerosis noted.  30-49% stenosis in the proximal CFA, low end range.  Three vessel run-off, with 30-49% stenosis noted in the mid and distal PTA, low end range.  Duplex ultrasound mesenteric circulation with 09/05/2022  Summary:  Mesenteric:  Normal Celiac artery findings.    Patent Aorta-SMA  bypass graft.   Recent Labs: 08/14/2022: ALT 18; BUN 7; Creatinine, Ser 0.95; Hemoglobin 12.7; Platelets 324; Potassium 3.9; Sodium 135   Lipid Panel    Component Value Date/Time   CHOL 125 09/04/2022 0958   CHOL 129 02/17/2019 1234   TRIG 99 09/04/2022 0958   HDL 60 09/04/2022 0958   HDL 47 02/17/2019 1234   CHOLHDL 2.1 09/04/2022 0958   VLDL 26 02/07/2016 0822   LDLCALC 47 09/04/2022 0958    ASSESSMENT:    1. Subclavian arterial stenosis (HCC)   2. Hypertension, unspecified type   3. Peripheral arterial disease (HCC)   4. PAT (paroxysmal atrial tachycardia) (HCC)   5. Precordial pain      PLAN:  In order of problems listed above:  CAD: Has had some recent chest pain at rest, but does not have chest discomfort with activity.  Cannot exclude atypical angina.  It has been 4 years since her last perfusion study.  Has a known moderate stenosis in the LAD artery by previous coronary angiography.  Will repeat a Lexiscan Myoview before she undergoes the planned abdominal surgery. 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  Most recent abdominal symptoms seem to be related to sigmoid diverticulitis. L SCL artery occlusion: Asymptomatic.  Equal blood pressures in the right and left upper extremity.  No symptoms of upper extremity claudication  or vertebral steal syndrome.   HLP: Over the last several years her LDL has been very well-controlled around 50, but surprisingly a few days ago was as high as 92.  Reinforced daily compliance with her statin. Ao atherosclerosis: Well documented on multiple radiological studies and associated with extensive PAD.  No evidence of AAA on most recent ultrasound earlier this year. PAD: Non-lifestyle limiting claudication.  Although she has inconsistent left calf claudication, she had no more than moderate stenoses in the arterial system and multiple locations in both lower extremities, normal bilateral ABIs.  To reestablish  follow-up at [facility] with Dr. Bernarda Caffey. HTN: When she checks her blood pressure at home her systolic is consistently in the 130s.  Her systolic blood pressure is a little high today.  No changes were made to her medications today.  Will continue to monitor. PAT: Currently asymptomatic.  She has never had documentation of atrial fibrillation. Preop CV exam: Assuming her upcoming SPECT study does not show evidence of coronary insufficiency, I think she is at acceptable risk for the planned sigmoid colectomy.  It will be very important make sure that her atenolol is continued without interruption in the perioperative period.  Would be okay to stop the clopidogrel for 5 to 7 days before the procedure and resume it postop.  Medication Adjustments/Labs and Tests Ordered: Current medicines are reviewed at length with the patient today.  Concerns regarding medicines are outlined above.  Medication changes, Labs and Tests ordered today are listed in the Patient Instructions below. Patient Instructions  Medication Instructions:  No changes *If you need a refill on your cardiac medications before your next appointment, please call your pharmacy*  Testing/Procedures: Dr Royann Shivers has ordered a Lexiscan Myocardial Perfusion Imaging Study.  Please arrive 15 minutes prior to your appointment time for registration and insurance purposes.   The test will take approximately 3 to 4 hours to complete; you may bring reading material.  If someone comes with you to your appointment, they will need to remain in the main lobby due to limited space in the testing area. **If you are pregnant or breastfeeding, please notify the nuclear lab prior to your appointment**   How to prepare for your Myocardial Perfusion Test: Do not eat or drink 3 hours prior to your test, except you may have water. Do not consume products containing caffeine (regular or decaffeinated) 12 hours prior to your test. (ex: coffee, chocolate, sodas,  tea). Do NOT wear cologne, perfume, aftershave, or lotions (deodorant is allowed). If you use an inhaler, use it the AM of your test and bring it with you.  If you use a nebulizer, use it the AM of your test.  If these instructions are not followed, your test will have to be rescheduled.    Follow-Up: At [facility], you and your health needs are our priority.  As part of our continuing mission to provide you with exceptional heart care, we have created designated Provider Care Teams.  These Care Teams include your primary Cardiologist (physician) and Advanced Practice Providers (APPs -  Physician Assistants and Nurse Practitioners) who all work together to provide you with the care you need, when you need it.  We recommend signing up for the patient portal called "MyChart".  Sign up information is provided on this After Visit Summary.  MyChart is used to connect with patients for Virtual Visits (Telemedicine).  Patients are able to view lab/test results, encounter notes, upcoming appointments, etc.  Non-urgent messages  can be sent to your provider as well.   To learn more about what you can do with MyChart, go to [email or web address].    Your next appointment:   1 year(s)  Provider:   Thurmon Fair, MD         Signed, Thurmon Fair, MD  05/13/2023 9:42 AM    [facility] [address] [place], [place], [place]  [address] Phone: [phone]; Fax: [phone]

## 2023-05-13 NOTE — Patient Instructions (Signed)
Medication Instructions:  No changes *If you need a refill on your cardiac medications before your next appointment, please call your pharmacy*  Testing/Procedures: Dr Royann Shivers has ordered a Lexiscan Myocardial Perfusion Imaging Study.  Please arrive 15 minutes prior to your appointment time for registration and insurance purposes.   The test will take approximately 3 to 4 hours to complete; you may bring reading material.  If someone comes with you to your appointment, they will need to remain in the main lobby due to limited space in the testing area. **If you are pregnant or breastfeeding, please notify the nuclear lab prior to your appointment**   How to prepare for your Myocardial Perfusion Test: Do not eat or drink 3 hours prior to your test, except you may have water. Do not consume products containing caffeine (regular or decaffeinated) 12 hours prior to your test. (ex: coffee, chocolate, sodas, tea). Do NOT wear cologne, perfume, aftershave, or lotions (deodorant is allowed). If you use an inhaler, use it the AM of your test and bring it with you.  If you use a nebulizer, use it the AM of your test.  If these instructions are not followed, your test will have to be rescheduled.    Follow-Up: At [facility], you and your health needs are our priority.  As part of our continuing mission to provide you with exceptional heart care, we have created designated Provider Care Teams.  These Care Teams include your primary Cardiologist (physician) and Advanced Practice Providers (APPs -  Physician Assistants and Nurse Practitioners) who all work together to provide you with the care you need, when you need it.  We recommend signing up for the patient portal called "MyChart".  Sign up information is provided on this After Visit Summary.  MyChart is used to connect with patients for Virtual Visits (Telemedicine).  Patients are able to view lab/test results, encounter notes, upcoming  appointments, etc.  Non-urgent messages can be sent to your provider as well.   To learn more about what you can do with MyChart, go to [email or web address].    Your next appointment:   1 year(s)  Provider:   Thurmon Fair, MD

## 2023-05-21 ENCOUNTER — Telehealth (HOSPITAL_COMMUNITY): Payer: Self-pay | Admitting: *Deleted

## 2023-05-21 NOTE — Telephone Encounter (Signed)
Patient given detailed instructions per Myocardial Perfusion Study Information Sheet for the test on 05/27/2023 at 10:45. Patient notified to arrive 15 minutes early and that it is imperative to arrive on time for appointment to keep from having the test rescheduled.  If you need to cancel or reschedule your appointment, please call the office within 24 hours of your appointment. . Patient verbalized understanding.Traci [name]

## 2023-05-22 ENCOUNTER — Encounter: Payer: Self-pay | Admitting: Family Medicine

## 2023-05-22 ENCOUNTER — Ambulatory Visit (INDEPENDENT_AMBULATORY_CARE_PROVIDER_SITE_OTHER): Payer: Medicare Other | Admitting: Family Medicine

## 2023-05-22 VITALS — BP 169/57 | HR 57 | Resp 14 | Ht 64.0 in | Wt 147.5 lb

## 2023-05-22 DIAGNOSIS — R300 Dysuria: Secondary | ICD-10-CM

## 2023-05-22 DIAGNOSIS — E7849 Other hyperlipidemia: Secondary | ICD-10-CM | POA: Diagnosis not present

## 2023-05-22 DIAGNOSIS — R7301 Impaired fasting glucose: Secondary | ICD-10-CM | POA: Diagnosis not present

## 2023-05-22 DIAGNOSIS — N1831 Chronic kidney disease, stage 3a: Secondary | ICD-10-CM | POA: Diagnosis not present

## 2023-05-22 DIAGNOSIS — Z0000 Encounter for general adult medical examination without abnormal findings: Secondary | ICD-10-CM | POA: Diagnosis not present

## 2023-05-22 LAB — POCT URINALYSIS DIP (CLINITEK)
Bilirubin, UA: NEGATIVE
Blood, UA: NEGATIVE
Glucose, UA: NEGATIVE mg/dL
Ketones, POC UA: NEGATIVE mg/dL
Nitrite, UA: NEGATIVE
POC PROTEIN,UA: NEGATIVE
Spec Grav, UA: 1.015 (ref 1.010–1.025)
Urobilinogen, UA: 0.2 U/dL
pH, UA: 6 (ref 5.0–8.0)

## 2023-05-22 MED ORDER — CLORAZEPATE DIPOTASSIUM 7.5 MG PO TABS: ORAL_TABLET | ORAL | 0 refills | Status: DC

## 2023-05-22 MED ORDER — NITROFURANTOIN MONOHYD MACRO 100 MG PO CAPS: 100.0000 mg | ORAL_CAPSULE | Freq: Two times a day (BID) | ORAL | 0 refills | Status: DC

## 2023-05-22 NOTE — Assessment & Plan Note (Signed)
Lab Results  Component Value Date   HGBA1C 6.2 (H) 09/04/2022   Due to recheck A1C. Labs ordered.

## 2023-05-22 NOTE — Progress Notes (Deleted)
Annual Wellness Visit     Patient: Traci [name], Female    DOB: [birth year], 77 y.o.   MRN: [id number]  Subjective  No chief complaint on file.   Traci [name] is a 77 y.o. female who presents today for her Annual Wellness Visit. She reports consuming a general diet. {Exercise:19826} She generally feels {well/fairly well/poorly:18703}. She reports sleeping {well/fairly well/poorly:18703}. She {does/does not:200015} have additional problems to discuss today.   HPI  {VISON DENTAL STD PSA (Optional):27386}   {History (Optional):23778}  Medications: Outpatient Medications Prior to Visit  Medication Sig   aspirin EC 81 MG tablet Take 81 mg by mouth daily.   atenolol (TENORMIN) 25 MG tablet Take 1 tablet (25 mg total) by mouth daily.   atorvastatin (LIPITOR) 10 MG tablet TAKE 1 TABLET DAILY AT 6 P.M.   candesartan (ATACAND) 4 MG tablet TAKE 1 TABLET IN THE MORNING AND AT BEDTIME   cholecalciferol (VITAMIN D) 1000 UNITS tablet Take 1,000 Units by mouth daily.   clopidogrel (PLAVIX) 75 MG tablet TAKE 1 TABLET DAILY   clorazepate (TRANXENE) 7.5 MG tablet Take 7.5 mg by mouth daily at 6 (six) AM.   cyanocobalamin 1000 MCG tablet Take 1,000 mcg by mouth daily.   EPINEPHrine (EPIPEN 2-PAK) 0.3 mg/0.3 mL IJ SOAJ injection Inject 0.3 mLs (0.3 mg total) into the muscle as needed (for allergic reaction). (Patient not taking: Reported on 05/13/2023)   esomeprazole (NEXIUM) 40 MG capsule Take 1 capsule (40 mg total) by mouth daily. Take 1 tab daily   estradiol (ESTRACE) 0.5 MG tablet TAKE 1 TABLET DAILY   furosemide (LASIX) 20 MG tablet TAKE 1 TABLET EVERY OTHER DAY (Patient taking differently: Take 20 mg by mouth 3 (three) times a week.)   linaclotide (LINZESS) 145 MCG CAPS capsule Take 145 mcg by mouth as needed. constipation   meclizine (ANTIVERT) 25 MG tablet TAKE ONE-HALF (1/2) TABLET THREE TIMES A DAY AS NEEDED FOR DIZZINESS OR NAUSEA   Multiple Vitamin  (MULTIVITAMIN WITH MINERALS) TABS Take 1 tablet by mouth daily.   zolpidem (AMBIEN) 5 MG tablet Take 1 tablet (5 mg total) by mouth at bedtime as needed for sleep.   No facility-administered medications prior to visit.    Allergies  Allergen Reactions   Cortisone Anaphylaxis   Dilaudid [Hydromorphone Hcl] Nausea And Vomiting    Pt states she will start vomiting immediately for 6 hours   Iodine Anaphylaxis   Medrol [Methylprednisolone] Anaphylaxis   Omnipaque [Iohexol] Anaphylaxis   Prednisone Anaphylaxis and Swelling   Shellfish Allergy Anaphylaxis   Sulfa Drugs Cross Reactors Anaphylaxis   Z-Pak [Azithromycin] Swelling   Doxycycline     Thrush/itching   Ivp Dye [Iodinated Contrast Media]     Other reaction(s): Unknown   Levaquin [Levofloxacin] Other (See Comments)    Numbness/Tingling fingertips, lightheadedness   Methylprednisolone Sodium Succ Swelling    Blood pressure drops immediately   Sulfa Antibiotics Other (See Comments)    Stomach upset   Augmentin [Amoxicillin-Pot Clavulanate] Nausea Only    Upset stomach (Has taken cephalexin in the past without problems)   Codeine Rash and Other (See Comments)    Upset stomach   Erythromycin Rash   Morphine And Codeine Nausea Only    nausea    Patient Care Team: Agapito Games, MD as PCP - General (Family Medicine) Thurmon Fair, MD as PCP - Cardiology (Cardiology) Maeola Harman, MD as Consulting Physician (Neurosurgery) Marnette Burgess, MD (Gastroenterology)  ROS  Objective  There were no vitals taken for this visit. {Vitals History (Optional):23777}  Physical Exam    Most recent functional status assessment:    08/06/2022    8:33 PM  In your present state of health, do you have any difficulty performing the following activities:  Hearing? 0  Vision? 0  Difficulty concentrating or making decisions? 0  Walking or climbing stairs? 0  Dressing or bathing? 0  Doing errands, shopping? 0   Preparing Food and eating ? N  Using the Toilet? N  In the past six months, have you accidently leaked urine? Y  Do you have problems with loss of bowel control? N  Managing your Medications? N  Managing your Finances? N  Housekeeping or managing your Housekeeping? N   Most recent fall risk assessment:    01/20/2023   10:54 AM  Fall Risk   Falls in the past year? 1  Number falls in past yr: 0  Injury with Fall? 1  Risk for fall due to : No Fall Risks  Follow up Falls evaluation completed    Most recent depression screenings:    08/29/2022   11:28 AM 08/09/2022   10:51 AM  PHQ 2/9 Scores  PHQ - 2 Score 4 0  PHQ- 9 Score 8    Most recent cognitive screening:    08/09/2022   11:03 AM  6CIT Screen  What Year? 0 points  What month? 0 points  What time? 0 points  Count back from 20 0 points  Months in reverse 0 points  Repeat phrase 0 points  Total Score 0 points   Most recent Audit-C alcohol use screening    05/18/2023    9:41 AM  Alcohol Use Disorder Test (AUDIT)  1. How often do you have a drink containing alcohol? 0  3. How often do you have six or more drinks on one occasion? 0   A score of 3 or more in women, and 4 or more in men indicates increased risk for alcohol abuse, EXCEPT if all of the points are from question 1   Vision/Hearing Screen: No results found.  {Labs (Optional):23779}  No results found for any visits on 05/22/23.    Assessment & Plan   Annual wellness visit done today including the all of the following: Reviewed patient's Family Medical History Reviewed and updated list of patient's medical providers Assessment of cognitive impairment was done Assessed patient's functional ability Established a written schedule for health screening services Health Risk Assessent Completed and Reviewed  Exercise Activities and Dietary recommendations  Goals       patient appreciative of call from LCSW. She will think about program support . Not  sure if she wants to participate in program at present      Intervention Called client today and spoke with her about program support. Described support in program with LCSW, RN and Pharmacist. Encouraged client to talk with PCP about program support  Client will think about program support.  She will talk with PCP if she is interested in participation in program. She did not request return call at present      Patient Stated      Patient states wants to be able to go out and travel again.       Patient Stated      06/21/2020 AWV Goal: Exercise for General Health  Patient will verbalize understanding of the benefits of increased physical activity: Exercising regularly is important. It will improve your  overall fitness, flexibility, and endurance. Regular exercise also will improve your overall health. It can help you control your weight, reduce stress, and improve your bone density. Over the next year, patient will increase physical activity as tolerated with a goal of at least 150 minutes of moderate physical activity per week.  You can tell that you are exercising at a moderate intensity if your heart starts beating faster and you start breathing faster but can still hold a conversation. Moderate-intensity exercise ideas include: Walking 1 mile (1.6 km) in about 15 minutes Biking Hiking Golfing Dancing Water aerobics Patient will verbalize understanding of everyday activities that increase physical activity by providing examples like the following: Yard work, such as: Insurance underwriter Gardening Washing windows or floors Patient will be able to explain general safety guidelines for exercising:  Before you start a new exercise program, talk with your health care provider. Do not exercise so much that you hurt yourself, feel dizzy, or get very short of breath. Wear comfortable clothes and wear shoes  with good support. Drink plenty of water while you exercise to prevent dehydration or heat stroke. Work out until your breathing and your heartbeat get faster.       Patient Stated (pt-stated)      Patient states she would like to start travelling again.      Patient Stated (pt-stated)      Patient would like to be able to return to her baseline.         Immunization History  Administered Date(s) Administered   Fluad Quad(high Dose 65+) 03/01/2019, 02/28/2020, 02/28/2021   Fluad Trivalent(High Dose 65+) 03/03/2023   Influenza, High Dose Seasonal PF 03/25/2018   Influenza, Seasonal, Injecte, Preservative Fre 03/01/2019   Influenza-Unspecified 03/02/2015, 03/15/2016, 02/21/2017, 02/07/2022   Lyme Disease 03/20/2009   PFIZER(Purple Top)SARS-COV-2 Vaccination 07/22/2019, 08/16/2019, 03/15/2020, 12/30/2020   Pfizer Covid-19 Vaccine Bivalent Booster 38yrs & up 04/01/2022   Pfizer(Comirnaty)Fall Seasonal Vaccine 12 years and older 04/01/2022   Pneumococcal Conjugate-13 08/30/2014   Pneumococcal Polysaccharide-23 10/10/2011, 06/17/2012, 01/20/2023   Tdap 06/17/2010, 04/08/2011, 03/23/2021   Zoster Recombinant(Shingrix) 03/23/2021, 05/24/2021   Zoster, Live 03/22/2012, 06/17/2012    Health Maintenance  Topic Date Due   COVID-19 Vaccine (6 - 2023-24 season) 02/16/2023   Medicare Annual Wellness (AWV)  08/10/2023   DEXA SCAN  05/08/2024   DTaP/Tdap/Td (4 - Td or Tdap) 03/24/2031   Pneumonia Vaccine 101+ Years old  Completed   INFLUENZA VACCINE  Completed   Hepatitis C Screening  Completed   Zoster Vaccines- Shingrix  Completed   HPV VACCINES  Aged Out   Colonoscopy  Discontinued     Discussed health benefits of physical activity, and encouraged her to engage in regular exercise appropriate for her age and condition.    Problem List Items Addressed This Visit   None   No follow-ups on file.     Nani Gasser, MD

## 2023-05-22 NOTE — Progress Notes (Signed)
Complete physical exam  Patient: Traci [name]   DOB: 1945/06/24   77 y.o. Female  MRN: [id number]  Subjective:    Chief Complaint  Patient presents with   Medicare Wellness    Follow up.    Traci [name] is a 77 y.o. female who presents today for a complete physical exam. She reports consuming a general diet. The patient does not participate in regular exercise at present. She generally feels well. She reports sleeping fairly well. She does not have additional problems to discuss today.   She unfortunately had another episode of diverticulitis back in September and followed up with Dr. Lula Olszewski at gastroenterology.  He recommended referral to the colorectal surgeon to discuss having part of the bowel removed.  She is planning on moving forward with this but needs approval from cardiology and cardiovascular.  She is actually scheduled for a nuclear stress test next Tuesday and will actually see her new cardiovascular surgeon Dr. Sherral Hammers the day after Christmas, Dr. Durwin Nora retired recently.  Once everything gets approved and cleared then she will likely have surgery in January.  Still uses the Linzess as needed for chronic constipation.  Also reports some pelvic pressure and low back pain for about a week.  No hematuria.  No fevers or chills.  Most recent fall risk assessment:    01/20/2023   10:54 AM  Fall Risk   Falls in the past year? 1  Number falls in past yr: 0  Injury with Fall? 1  Risk for fall due to : No Fall Risks  Follow up Falls evaluation completed     Most recent depression screenings:    08/29/2022   11:28 AM 08/09/2022   10:51 AM  PHQ 2/9 Scores  PHQ - 2 Score 4 0  PHQ- 9 Score 8         Patient Care Team: Agapito Games, MD as PCP - General (Family Medicine) Croitoru, Rachelle Hora, MD as PCP - Cardiology (Cardiology) Maeola Harman, MD as Consulting Physician (Neurosurgery) Marnette Burgess, MD (Gastroenterology)    Outpatient Medications Prior to Visit  Medication Sig   aspirin EC 81 MG tablet Take 81 mg by mouth daily.   atenolol (TENORMIN) 25 MG tablet Take 1 tablet (25 mg total) by mouth daily.   atorvastatin (LIPITOR) 10 MG tablet TAKE 1 TABLET DAILY AT 6 P.M.   candesartan (ATACAND) 4 MG tablet TAKE 1 TABLET IN THE MORNING AND AT BEDTIME   cholecalciferol (VITAMIN D) 1000 UNITS tablet Take 1,000 Units by mouth daily.   clopidogrel (PLAVIX) 75 MG tablet TAKE 1 TABLET DAILY   cyanocobalamin 1000 MCG tablet Take 1,000 mcg by mouth daily.   EPINEPHrine (EPIPEN 2-PAK) 0.3 mg/0.3 mL IJ SOAJ injection Inject 0.3 mLs (0.3 mg total) into the muscle as needed (for allergic reaction).   esomeprazole (NEXIUM) 40 MG capsule Take 1 capsule (40 mg total) by mouth daily. Take 1 tab daily   estradiol (ESTRACE) 0.5 MG tablet TAKE 1 TABLET DAILY   furosemide (LASIX) 20 MG tablet TAKE 1 TABLET EVERY OTHER DAY (Patient taking differently: Take 20 mg by mouth 3 (three) times a week.)   linaclotide (LINZESS) 145 MCG CAPS capsule Take 145 mcg by mouth as needed. constipation   meclizine (ANTIVERT) 25 MG tablet TAKE ONE-HALF (1/2) TABLET THREE TIMES A DAY AS NEEDED FOR DIZZINESS OR NAUSEA   Multiple Vitamin (MULTIVITAMIN WITH MINERALS) TABS Take 1 tablet by mouth daily.   zolpidem (AMBIEN) 5 MG  tablet Take 1 tablet (5 mg total) by mouth at bedtime as needed for sleep.   [DISCONTINUED] clorazepate (TRANXENE) 7.5 MG tablet Take 7.5 mg by mouth daily at 6 (six) AM.   No facility-administered medications prior to visit.    ROS        Objective:     BP (!) 169/57   Pulse (!) 57   Resp 14   Ht 5\' 4"  (1.626 m)   Wt 147 lb 8 oz (66.9 kg)   SpO2 98%   BMI 25.32 kg/m    Physical Exam Vitals and nursing note reviewed.  Constitutional:      Appearance: Normal appearance.  HENT:     Head: Normocephalic and atraumatic.  Eyes:     Conjunctiva/sclera: Conjunctivae normal.  Cardiovascular:     Rate and Rhythm:  Normal rate and regular rhythm.  Pulmonary:     Effort: Pulmonary effort is normal.     Breath sounds: Normal breath sounds.  Skin:    General: Skin is warm and dry.  Neurological:     Mental Status: She is alert.  Psychiatric:        Mood and Affect: Mood normal.      Results for orders placed or performed in visit on 05/22/23  POCT URINALYSIS DIP (CLINITEK)  Result Value Ref Range   Color, UA yellow yellow   Clarity, UA clear clear   Glucose, UA negative negative mg/dL   Bilirubin, UA negative negative   Ketones, POC UA negative negative mg/dL   Spec Grav, UA 3.244 0.102 - 1.025   Blood, UA negative negative   pH, UA 6.0 5.0 - 8.0   POC PROTEIN,UA negative negative, trace   Urobilinogen, UA 0.2 0.2 or 1.0 E.U./dL   Nitrite, UA Negative Negative   Leukocytes, UA Small (1+) (A) Negative       Assessment & Plan:    Routine Health Maintenance and Physical Exam  Immunization History  Administered Date(s) Administered   Fluad Quad(high Dose 65+) 03/01/2019, 02/28/2020, 02/28/2021   Fluad Trivalent(High Dose 65+) 03/03/2023   Influenza, High Dose Seasonal PF 03/25/2018   Influenza, Seasonal, Injecte, Preservative Fre 03/01/2019   Influenza-Unspecified 03/02/2015, 03/15/2016, 02/21/2017, 02/07/2022   Lyme Disease 03/20/2009   PFIZER(Purple Top)SARS-COV-2 Vaccination 07/22/2019, 08/16/2019, 03/15/2020, 12/30/2020   Pfizer Covid-19 Vaccine Bivalent Booster 75yrs & up 04/01/2022   Pfizer(Comirnaty)Fall Seasonal Vaccine 12 years and older 04/01/2022   Pneumococcal Conjugate-13 08/30/2014   Pneumococcal Polysaccharide-23 10/10/2011, 06/17/2012, 01/20/2023   Tdap 06/17/2010, 04/08/2011, 03/23/2021   Zoster Recombinant(Shingrix) 03/23/2021, 05/24/2021   Zoster, Live 03/22/2012, 06/17/2012    Health Maintenance  Topic Date Due   COVID-19 Vaccine (6 - 2023-24 season) 02/16/2023   Medicare Annual Wellness (AWV)  08/10/2023   DEXA SCAN  05/08/2024   DTaP/Tdap/Td (4 - Td or  Tdap) 03/24/2031   Pneumonia Vaccine 90+ Years old  Completed   INFLUENZA VACCINE  Completed   Hepatitis C Screening  Completed   Zoster Vaccines- Shingrix  Completed   HPV VACCINES  Aged Out   Colonoscopy  Discontinued    Discussed health benefits of physical activity, and encouraged her to engage in regular exercise appropriate for her age and condition.  Problem List Items Addressed This Visit       Endocrine   IFG (impaired fasting glucose)    Lab Results  Component Value Date   HGBA1C 6.2 (H) 09/04/2022   Due to recheck A1C. Labs ordered.  Relevant Orders   Hemoglobin A1c     Genitourinary   CKD (chronic kidney disease) stage 3, GFR 30-59 ml/min (HCC)    Continue to follow renal function Q 6 months.        Relevant Orders   CMP14+EGFR   Lipid panel   CBC   Hemoglobin A1c     Other   Hyperlipidemia (Chronic)   Relevant Orders   CMP14+EGFR   Lipid panel   CBC   Hemoglobin A1c   Other Visit Diagnoses     Wellness examination    -  Primary   Relevant Orders   CMP14+EGFR   Lipid panel   CBC   Hemoglobin A1c   Dysuria       Relevant Orders   POCT URINALYSIS DIP (CLINITEK) (Completed)       Keep up a regular exercise program and make sure you are eating a healthy diet Try to eat 4 servings of dairy a day, or if you are lactose intolerant take a calcium with vitamin D daily.  Your vaccines are up to date.   Dysuria - UA shows positive for leukocytes and she is symptomatic we will go ahead and treat with Macrobid if not improving please let us know we can always send for culture if needed.  Return in about 6 months (around 11/20/2023) for sleep meds, etc.     Nani Gasser, MD

## 2023-05-22 NOTE — Assessment & Plan Note (Addendum)
.  check q 6 mo

## 2023-05-23 ENCOUNTER — Encounter: Payer: Self-pay | Admitting: Family Medicine

## 2023-05-23 LAB — HEMOGLOBIN A1C
Est. average glucose Bld gHb Est-mCnc: 131 mg/dL
Hgb A1c MFr Bld: 6.2 % — ABNORMAL HIGH (ref 4.8–5.6)

## 2023-05-23 LAB — CBC
Hematocrit: 36.2 % (ref 34.0–46.6)
Hemoglobin: 11.8 g/dL (ref 11.1–15.9)
MCH: 29.9 pg (ref 26.6–33.0)
MCHC: 32.6 g/dL (ref 31.5–35.7)
MCV: 92 fL (ref 79–97)
Platelets: 391 10*3/uL (ref 150–450)
RBC: 3.94 x10E6/uL (ref 3.77–5.28)
RDW: 12 % (ref 11.7–15.4)
WBC: 5.5 10*3/uL (ref 3.4–10.8)

## 2023-05-23 LAB — CMP14+EGFR
ALT: 13 [IU]/L (ref 0–32)
AST: 25 [IU]/L (ref 0–40)
Albumin: 4.3 g/dL (ref 3.8–4.8)
Alkaline Phosphatase: 66 [IU]/L (ref 44–121)
BUN/Creatinine Ratio: 16 (ref 12–28)
BUN: 17 mg/dL (ref 8–27)
Bilirubin Total: 0.3 mg/dL (ref 0.0–1.2)
CO2: 24 mmol/L (ref 20–29)
Calcium: 8.5 mg/dL — ABNORMAL LOW (ref 8.7–10.3)
Chloride: 97 mmol/L (ref 96–106)
Creatinine, Ser: 1.06 mg/dL — ABNORMAL HIGH (ref 0.57–1.00)
Globulin, Total: 2.7 g/dL (ref 1.5–4.5)
Glucose: 129 mg/dL — ABNORMAL HIGH (ref 70–99)
Potassium: 4.8 mmol/L (ref 3.5–5.2)
Sodium: 136 mmol/L (ref 134–144)
Total Protein: 7 g/dL (ref 6.0–8.5)
eGFR: 54 mL/min/{1.73_m2} — ABNORMAL LOW (ref >59)

## 2023-05-23 LAB — LIPID PANEL
Chol/HDL Ratio: 2.5 {ratio} (ref 0.0–4.4)
Cholesterol, Total: 130 mg/dL (ref 100–199)
HDL: 51 mg/dL (ref >39)
LDL Chol Calc (NIH): 55 mg/dL (ref 0–99)
Triglycerides: 137 mg/dL (ref 0–149)
VLDL Cholesterol Cal: 24 mg/dL (ref 5–40)

## 2023-05-23 NOTE — Progress Notes (Signed)
Hi Traci [name], kidney function is up just a little bit for you normally around 0.9 it was 1.0.  Just a slight shift but she also looked a little bit more dry on your labs.  Your calcium level was a little low as well.  Do want you to try to make sure you are getting some calcium through your diet.  Preferably not a supplement but just through your diet.  Green leafy's are great.  Liver function looks normal.  A1c is stable at 6.2 that is what you were last time.  Cholesterol looks great this time.  LDL is at goal.  Blood count is normal no sign of anemia which is awesome.

## 2023-05-27 ENCOUNTER — Ambulatory Visit (HOSPITAL_COMMUNITY): Payer: Medicare Other | Attending: Cardiovascular Disease

## 2023-05-27 ENCOUNTER — Encounter: Payer: Self-pay | Admitting: Cardiovascular Disease

## 2023-05-27 DIAGNOSIS — R072 Precordial pain: Secondary | ICD-10-CM | POA: Diagnosis not present

## 2023-05-27 LAB — MYOCARDIAL PERFUSION IMAGING
Base ST Depression (mm): 0 mm
LV dias vol: 63 mL (ref 46–106)
LV sys vol: 15 mL
Nuc Stress EF: 77 %
Peak HR: 82 {beats}/min
Rest HR: 55 {beats}/min
Rest Nuclear Isotope Dose: 10.9 mCi
SDS: 0
SRS: 0
SSS: 0
ST Depression (mm): 0 mm
Stress Nuclear Isotope Dose: 32.3 mCi
TID: 0.97

## 2023-05-27 MED ORDER — REGADENOSON 0.4 MG/5ML IV SOLN
0.4000 mg | Freq: Once | INTRAVENOUS | Status: AC
  Administered 2023-05-27: 0.4 mg via INTRAVENOUS

## 2023-05-27 MED ORDER — TECHNETIUM TC 99M TETROFOSMIN IV KIT
10.9000 | PACK | Freq: Once | INTRAVENOUS | Status: AC | PRN
Start: 2023-05-27 — End: 2023-05-27
  Administered 2023-05-27: 10.9 via INTRAVENOUS

## 2023-05-27 MED ORDER — TECHNETIUM TC 99M TETROFOSMIN IV KIT
32.3000 | PACK | Freq: Once | INTRAVENOUS | Status: AC | PRN
  Administered 2023-05-27: 32.3 via INTRAVENOUS

## 2023-06-02 ENCOUNTER — Other Ambulatory Visit: Payer: Self-pay | Admitting: Nurse Practitioner

## 2023-06-03 ENCOUNTER — Other Ambulatory Visit: Payer: Self-pay | Admitting: *Deleted

## 2023-06-03 DIAGNOSIS — K551 Chronic vascular disorders of intestine: Secondary | ICD-10-CM

## 2023-06-09 NOTE — Progress Notes (Unsigned)
REASON FOR VISIT:   Routine follow-up visit  MEDICAL ISSUES:   S/P AORTO SMA BYPASS: This patient has had some abdominal pain recently and is followed by gastroenterology.  However her aorto SMA bypass graft is widely patent and I do not think her symptoms are consistent with chronic mesenteric ischemia.  The symptoms occur immediately after eating where his symptoms are chronic mesenteric ischemia oftentimes occur 15 to 20 minutes after eating.  She has had no weight loss.  She is scheduled to see her gastroenterologist soon for further workup.   S/P LEFT CAROTID SUBCLAVIAN BYPASS: She has a palpable left radial pulse and no significant vertebrobasilar symptoms.  I will get a carotid duplex scan in 1 year and we will check the patency of her bypass graft.  She would like to be seen by a physician in 1 year when she returns and so I will make those arrangements.  I explained that I will be retiring in September.   HPI:   Traci [name] is a pleasant 77 y.o. female who I last saw on 05/31/2021.  She is well-known to me.  She has had a previous left carotid subclavian bypass and also an aorto SMA bypass in 2015.  She also tells me that we did her exposure for spine surgery.  When I saw her last she was asymptomatic with no postprandial abdominal pain.  Her carotid subclavian bypass graft was patent and she was having no vertebrobasilar symptoms or left arm symptoms.  She comes in for a 1 year follow-up visit.    Since I saw her last, she has been having some abdominal pain after eating.  The pain occurs right with eating and there is no delay.  She denies any weight loss.  She also has some associated nausea.  She has had multiple GI issues in the past and is followed by gastroenterology.  She denies any history of stroke, TIAs, expressive or receptive aphasia, or amaurosis fugax.  She has had no significant dizziness.  Past Medical History:  Diagnosis Date   Allergy 1995    Contrast dye   Anxiety    Arthritis 2023   Atrial fibrillation (HCC)    CAD (coronary artery disease)    CKD (chronic kidney disease) stage 3, GFR 30-59 ml/min (HCC) 08/04/2017   Claudication (HCC) 01/06/2008   Lower extremity dopplers - no evidence of arterial insufficiency, normal exam   Coronary artery disease    GERD (gastroesophageal reflux disease)    Heart murmur    Hyperlipidemia    Hypertension 11/30/2010   echo- EF 55%; normal w/ mildly sclerotic aortic valve   Hypertension 11/05/2011   renal dopplers - celiac artery and SMA >50% diameter reduction, R renal artery - mildly elevated velocities 1-59% diameter reduction, L renal artery normal   Nonspecific ST-T wave electrocardiographic changes 03/26/2011   R/Lmv - EF 74%, normal perfusion all regions, ST depression w/ Lexiscan infusion w/o assoc angina   Osteopenia    PAD (peripheral artery disease) (HCC)    Peripheral vascular disease (HCC)    PVD (peripheral vascular disease) (HCC) 11/05/2011   doppler - R/L brachial pressures essentially equal w/o inflow disease; L sublclavian/CCA bypass graft demonstrates patent flow, no evidence of significant stenosis   Sigmoid diverticulitis     Family History  Problem Relation Age of Onset   Heart disease Father 66       Heart Disease before age 73   Kidney disease Father  Heart attack Father    Hyperlipidemia Father    Hypertension Father    Alcohol abuse Father    Kidney failure Mother    Arthritis Mother    Diabetes Maternal Grandmother    Heart disease Paternal Grandfather     SOCIAL HISTORY: Social History   Tobacco Use   Smoking status: Former    Current packs/day: 0.00    Average packs/day: 0.3 packs/day for 20.0 years (5.0 ttl pk-yrs)    Types: Cigarettes    Start date: 09/30/1973    Quit date: 09/30/1993    Years since quitting: 29.7   Smokeless tobacco: Never  Substance Use Topics   Alcohol use: No    Allergies  Allergen Reactions   Cortisone  Anaphylaxis   Dilaudid [Hydromorphone Hcl] Nausea And Vomiting    Pt states she will start vomiting immediately for 6 hours   Iodine Anaphylaxis   Medrol [Methylprednisolone] Anaphylaxis   Omnipaque [Iohexol] Anaphylaxis   Prednisone Anaphylaxis and Swelling   Shellfish Allergy Anaphylaxis   Sulfa Drugs Cross Reactors Anaphylaxis   Z-Pak [Azithromycin] Swelling   Doxycycline     Thrush/itching   Ivp Dye [Iodinated Contrast Media]     Other reaction(s): Unknown   Levaquin [Levofloxacin] Other (See Comments)    Numbness/Tingling fingertips, lightheadedness   Methylprednisolone Sodium Succ Swelling    Blood pressure drops immediately   Sulfa Antibiotics Other (See Comments)    Stomach upset   Augmentin [Amoxicillin-Pot Clavulanate] Nausea Only    Upset stomach (Has taken cephalexin in the past without problems)   Codeine Rash and Other (See Comments)    Upset stomach   Erythromycin Rash   Morphine And Codeine Nausea Only    nausea    Current Outpatient Medications  Medication Sig Dispense Refill   aspirin EC 81 MG tablet Take 81 mg by mouth daily.     atenolol (TENORMIN) 25 MG tablet TAKE 1 TABLET DAILY 90 tablet 3   atorvastatin (LIPITOR) 10 MG tablet TAKE 1 TABLET DAILY AT 6 P.M. 90 tablet 3   candesartan (ATACAND) 4 MG tablet TAKE 1 TABLET IN THE MORNING AND AT BEDTIME 180 tablet 3   cholecalciferol (VITAMIN D) 1000 UNITS tablet Take 1,000 Units by mouth daily.     clopidogrel (PLAVIX) 75 MG tablet TAKE 1 TABLET DAILY 90 tablet 3   clorazepate (TRANXENE) 7.5 MG tablet TAKE ONE-HALF (1/2) TO ONE TABLET DAILY AS NEEDED 85 tablet 0   cyanocobalamin 1000 MCG tablet Take 1,000 mcg by mouth daily.     EPINEPHrine (EPIPEN 2-PAK) 0.3 mg/0.3 mL IJ SOAJ injection Inject 0.3 mLs (0.3 mg total) into the muscle as needed (for allergic reaction). 2 Device 0   esomeprazole (NEXIUM) 40 MG capsule Take 1 capsule (40 mg total) by mouth daily. Take 1 tab daily 90 capsule 0   estradiol  (ESTRACE) 0.5 MG tablet TAKE 1 TABLET DAILY 90 tablet 3   furosemide (LASIX) 20 MG tablet TAKE 1 TABLET EVERY OTHER DAY (Patient taking differently: Take 20 mg by mouth 3 (three) times a week.) 45 tablet 3   linaclotide (LINZESS) 145 MCG CAPS capsule Take 145 mcg by mouth as needed. constipation     meclizine (ANTIVERT) 25 MG tablet TAKE ONE-HALF (1/2) TABLET THREE TIMES A DAY AS NEEDED FOR DIZZINESS OR NAUSEA 60 tablet 8   Multiple Vitamin (MULTIVITAMIN WITH MINERALS) TABS Take 1 tablet by mouth daily.     nitrofurantoin, macrocrystal-monohydrate, (MACROBID) 100 MG capsule Take 1 capsule (100 mg  total) by mouth 2 (two) times daily. 10 capsule 0   zolpidem (AMBIEN) 5 MG tablet Take 1 tablet (5 mg total) by mouth at bedtime as needed for sleep. 90 tablet 1   No current facility-administered medications for this visit.    REVIEW OF SYSTEMS:  [X]  denotes positive finding, [ ]  denotes negative finding Cardiac  Comments:  Chest pain or chest pressure:    Shortness of breath upon exertion:    Short of breath when lying flat:    Irregular heart rhythm: x       Vascular    Pain in calf, thigh, or hip brought on by ambulation:    Pain in feet at night that wakes you up from your sleep:     Blood clot in your veins:    Leg swelling:         Pulmonary    Oxygen at home:    Productive cough:     Wheezing:         Neurologic    Sudden weakness in arms or legs:     Sudden numbness in arms or legs:     Sudden onset of difficulty speaking or slurred speech:    Temporary loss of vision in one eye:     Problems with dizziness:         Gastrointestinal    Blood in stool:     Vomited blood:         Genitourinary    Burning when urinating:     Blood in urine:        Psychiatric    Major depression:         Hematologic    Bleeding problems:    Problems with blood clotting too easily:        Skin    Rashes or ulcers:        Constitutional    Fever or chills:     PHYSICAL EXAM:    There were no vitals filed for this visit.   GENERAL: The patient is a well-nourished female, in no acute distress. The vital signs are documented above. CARDIAC: There is a regular rate and rhythm.  VASCULAR: I do not detect carotid bruits. She has palpable radial pulses. She has palpable posterior tibial pulses. PULMONARY: There is good air exchange bilaterally without wheezing or rales. ABDOMEN: Soft and non-tender with normal pitched bowel sounds.  MUSCULOSKELETAL: There are no major deformities or cyanosis. NEUROLOGIC: No focal weakness or paresthesias are detected. SKIN: There are no ulcers or rashes noted. PSYCHIATRIC: The patient has a normal affect.  DATA:    MESENTERIC DUPLEX: I have independently interpreted her mesenteric duplex scan.  Her celiac axis is widely patent.  Her aorta SMA bypass is patent.  Victorino Sparrow [facility] [phone]

## 2023-06-12 ENCOUNTER — Ambulatory Visit (INDEPENDENT_AMBULATORY_CARE_PROVIDER_SITE_OTHER): Payer: Medicare Other | Admitting: Vascular Surgery

## 2023-06-12 ENCOUNTER — Ambulatory Visit (HOSPITAL_COMMUNITY)
Admission: RE | Admit: 2023-06-12 | Discharge: 2023-06-12 | Disposition: A | Discharge status: Home / Self Care | Payer: Medicare Other | Source: Ambulatory Visit | Attending: Vascular Surgery | Admitting: Vascular Surgery

## 2023-06-12 VITALS — BP 190/71 | HR 49 | Ht 64.0 in | Wt 149.0 lb

## 2023-06-12 DIAGNOSIS — Z48812 Encounter for surgical aftercare following surgery on the circulatory system: Secondary | ICD-10-CM | POA: Diagnosis not present

## 2023-06-12 DIAGNOSIS — K551 Chronic vascular disorders of intestine: Secondary | ICD-10-CM

## 2023-06-12 DIAGNOSIS — I771 Stricture of artery: Secondary | ICD-10-CM | POA: Diagnosis not present

## 2023-06-20 ENCOUNTER — Other Ambulatory Visit: Payer: Self-pay

## 2023-06-20 DIAGNOSIS — I771 Stricture of artery: Secondary | ICD-10-CM

## 2023-06-24 ENCOUNTER — Other Ambulatory Visit: Payer: Self-pay

## 2023-06-24 DIAGNOSIS — K551 Chronic vascular disorders of intestine: Secondary | ICD-10-CM

## 2023-06-27 ENCOUNTER — Ambulatory Visit (HOSPITAL_COMMUNITY)
Admission: RE | Admit: 2023-06-27 | Discharge: 2023-06-27 | Disposition: A | Discharge status: Home / Self Care | Payer: Medicare Other | Source: Ambulatory Visit | Attending: Vascular Surgery | Admitting: Vascular Surgery

## 2023-06-27 DIAGNOSIS — I771 Stricture of artery: Secondary | ICD-10-CM | POA: Diagnosis not present

## 2023-06-30 ENCOUNTER — Ambulatory Visit (INDEPENDENT_AMBULATORY_CARE_PROVIDER_SITE_OTHER): Payer: Self-pay | Admitting: Vascular Surgery

## 2023-06-30 DIAGNOSIS — I771 Stricture of artery: Secondary | ICD-10-CM

## 2023-06-30 NOTE — Progress Notes (Signed)
 Assessment and Plan:  Patient is a 78 year old female with previous history of left carotid subclavian bypass-2011, and aorto SMA bypass-2015, with plans for colon resection for diverticulitis.  Upon reviewing her imaging, there is no bypass stenosis, and the celiac artery is widely patent.  The IMA was not imaged, however on previous imaging in February of this year, it was widely patent as well.  Imaging also demonstrated widely patent hypogastric arteries bilaterally. Chronic symptoms of postprandial pain are not related to mesenteric vascular insufficiency.  From a vascular surgery standpoint [name] is optimized for colon surgery.  Being that her carotid disease / left carotid subclavian bypass has not been imaged recently, I plan to have her carotids insonated, and call her with the results of the study in the coming weeks.  _______   [place] regarding Left carotid subclavian studies -  relatively unchanged from 2 years ago, remains triphasic, with some amount of turbulence.  No significant carotid stenosis.  No plan for intervention at this time.  Will continue to follow with yearly duplex ultrasounds.  Fonda FORBES Rim MD   HPI:   Traci Howden -Burleson is a pleasant 78 y.o. female last seen by my office in March 2024 with history of  left carotid subclavian bypass (2011) and aorto SMA bypass (2015), both done by my partner Dr. Eliza.  Our office was recently called by [facility] general surgery due to plans for colon resection for colon resection for diverticulitis by Dr. Sammie. Unfortunately, Dr. Eliza has retired, and therefore asked to see the patient prior to surgery to ensure the aorta SMA bypass was widely patent.  Most recent imaging of the abdomen demonstrated widely patent celiac, SMA, IMA - February 2024 MRA  On exam today, [name] was doing well.  She continues to have some waxing waning abdominal pain which is postprandial.  The most notable symptom  is bloating.  Per [name], and previous notes, has been going on for quite some time.  No recent weight loss.  She is followed by GI, but has not had a scope recently.  She denies left arm pain with use.  Denies vertebrobasilar symptoms. She denies any history of stroke, TIAs, expressive or receptive aphasia, or amaurosis fugax.  She has had no significant dizziness.  Past Medical History:  Diagnosis Date   Allergy 1995   Contrast dye   Anxiety    Arthritis 2023   Atrial fibrillation (HCC)    CAD (coronary artery disease)    CKD (chronic kidney disease) stage 3, GFR 30-59 ml/min (HCC) 08/04/2017   Claudication (HCC) 01/06/2008   Lower extremity dopplers - no evidence of arterial insufficiency, normal exam   Coronary artery disease    GERD (gastroesophageal reflux disease)    Heart murmur    Hyperlipidemia    Hypertension 11/30/2010   echo- EF 55%; normal w/ mildly sclerotic aortic valve   Hypertension 11/05/2011   renal dopplers - celiac artery and SMA >50% diameter reduction, R renal artery - mildly elevated velocities 1-59% diameter reduction, L renal artery normal   Nonspecific ST-T wave electrocardiographic changes 03/26/2011   R/Lmv - EF 74%, normal perfusion all regions, ST depression w/ Lexiscan  infusion w/o assoc angina   Osteopenia    PAD (peripheral artery disease) (HCC)    Peripheral vascular disease (HCC)    PVD (peripheral vascular disease) (HCC) 11/05/2011   doppler - R/L brachial pressures essentially equal w/o inflow disease; L sublclavian/CCA bypass graft demonstrates patent flow, no  evidence of significant stenosis   Sigmoid diverticulitis     Family History  Problem Relation Age of Onset   Heart disease Father 47       Heart Disease before age 107   Kidney disease Father    Heart attack Father    Hyperlipidemia Father    Hypertension Father    Alcohol abuse Father    Kidney failure Mother    Arthritis Mother    Diabetes Maternal Grandmother    Heart  disease Paternal Grandfather     SOCIAL HISTORY: Social History   Tobacco Use   Smoking status: Former    Current packs/day: 0.00    Average packs/day: 0.3 packs/day for 20.0 years (5.0 ttl pk-yrs)    Types: Cigarettes    Start date: 09/30/1973    Quit date: 09/30/1993    Years since quitting: 29.7   Smokeless tobacco: Never  Substance Use Topics   Alcohol use: No    Allergies  Allergen Reactions   Cortisone Anaphylaxis   Dilaudid [Hydromorphone Hcl] Nausea And Vomiting    Pt states she will start vomiting immediately for 6 hours   Iodine Anaphylaxis   Medrol [Methylprednisolone] Anaphylaxis   Omnipaque [Iohexol] Anaphylaxis   Prednisone  Anaphylaxis and Swelling   Shellfish Allergy Anaphylaxis   Sulfa Drugs Cross Reactors Anaphylaxis   Z-Pak [Azithromycin ] Swelling   Doxycycline      Thrush/itching   Ivp Dye [Iodinated Contrast Media]     Other reaction(s): Unknown   Levaquin  [Levofloxacin ] Other (See Comments)    Numbness/Tingling fingertips, lightheadedness   Methylprednisolone Sodium Succ Swelling    Blood pressure drops immediately   Sulfa Antibiotics Other (See Comments)    Stomach upset   Augmentin  [Amoxicillin -Pot Clavulanate] Nausea Only    Upset stomach (Has taken cephalexin  in the past without problems)   Codeine Rash and Other (See Comments)    Upset stomach   Erythromycin Rash   Morphine  And Codeine Nausea Only    nausea    Current Outpatient Medications  Medication Sig Dispense Refill   aspirin  EC 81 MG tablet Take 81 mg by mouth daily.     atenolol  (TENORMIN ) 25 MG tablet TAKE 1 TABLET DAILY 90 tablet 3   atorvastatin  (LIPITOR) 10 MG tablet TAKE 1 TABLET DAILY AT 6 P.M. 90 tablet 3   candesartan  (ATACAND ) 4 MG tablet TAKE 1 TABLET IN THE MORNING AND AT BEDTIME 180 tablet 3   cholecalciferol  (VITAMIN D ) 1000 UNITS tablet Take 1,000 Units by mouth daily.     clopidogrel  (PLAVIX ) 75 MG tablet TAKE 1 TABLET DAILY 90 tablet 3   clorazepate  (TRANXENE )  7.5 MG tablet TAKE ONE-HALF (1/2) TO ONE TABLET DAILY AS NEEDED 85 tablet 0   cyanocobalamin  1000 MCG tablet Take 1,000 mcg by mouth daily.     EPINEPHrine  (EPIPEN  2-PAK) 0.3 mg/0.3 mL IJ SOAJ injection Inject 0.3 mLs (0.3 mg total) into the muscle as needed (for allergic reaction). 2 Device 0   esomeprazole  (NEXIUM ) 40 MG capsule Take 1 capsule (40 mg total) by mouth daily. Take 1 tab daily 90 capsule 0   estradiol  (ESTRACE ) 0.5 MG tablet TAKE 1 TABLET DAILY 90 tablet 3   furosemide  (LASIX ) 20 MG tablet TAKE 1 TABLET EVERY OTHER DAY (Patient taking differently: Take 20 mg by mouth 3 (three) times a week.) 45 tablet 3   linaclotide (LINZESS) 145 MCG CAPS capsule Take 145 mcg by mouth as needed. constipation     meclizine  (ANTIVERT ) 25 MG tablet TAKE  ONE-HALF (1/2) TABLET THREE TIMES A DAY AS NEEDED FOR DIZZINESS OR NAUSEA 60 tablet 8   Multiple Vitamin (MULTIVITAMIN WITH MINERALS) TABS Take 1 tablet by mouth daily.     nitrofurantoin , macrocrystal-monohydrate, (MACROBID ) 100 MG capsule Take 1 capsule (100 mg total) by mouth 2 (two) times daily. 10 capsule 0   zolpidem  (AMBIEN ) 5 MG tablet Take 1 tablet (5 mg total) by mouth at bedtime as needed for sleep. 90 tablet 1   No current facility-administered medications for this visit.    REVIEW OF SYSTEMS:  [X]  denotes positive finding, [ ]  denotes negative finding Cardiac  Comments:  Chest pain or chest pressure:    Shortness of breath upon exertion:    Short of breath when lying flat:    Irregular heart rhythm: x       Vascular    Pain in calf, thigh, or hip brought on by ambulation:    Pain in feet at night that wakes you up from your sleep:     Blood clot in your veins:    Leg swelling:         Pulmonary    Oxygen  at home:    Productive cough:     Wheezing:         Neurologic    Sudden weakness in arms or legs:     Sudden numbness in arms or legs:     Sudden onset of difficulty speaking or slurred speech:    Temporary loss of  vision in one eye:     Problems with dizziness:         Gastrointestinal    Blood in stool:     Vomited blood:         Genitourinary    Burning when urinating:     Blood in urine:        Psychiatric    Major depression:         Hematologic    Bleeding problems:    Problems with blood clotting too easily:        Skin    Rashes or ulcers:        Constitutional    Fever or chills:     PHYSICAL EXAM:   There were no vitals filed for this visit.   GENERAL: The patient is a well-nourished female, in no acute distress. The vital signs are documented above. CARDIAC: There is a regular rate and rhythm.  VASCULAR:  She has palpable radial pulses. She has palpable posterior tibial pulses. PULMONARY: Nonlabored breathing ABDOMEN: Soft and non-tender  MUSCULOSKELETAL: There are no major deformities or cyanosis. NEUROLOGIC: No focal weakness or paresthesias are detected. SKIN: There are no ulcers or rashes noted. PSYCHIATRIC: The patient has a normal affect.  DATA:    MESENTERIC DUPLEX: I have independently interpreted her mesenteric duplex scan.  Her celiac axis is widely patent.  Her aorta SMA bypass is patent.  Fonda FORBES Rim [facility] [phone]

## 2023-07-11 ENCOUNTER — Other Ambulatory Visit: Payer: Self-pay

## 2023-07-11 ENCOUNTER — Encounter: Payer: Self-pay | Admitting: Emergency Medicine

## 2023-07-11 ENCOUNTER — Ambulatory Visit
Admission: EM | Admit: 2023-07-11 | Discharge: 2023-07-11 | Disposition: A | Discharge status: Home / Self Care | Payer: Medicare Other | Attending: Family Medicine | Admitting: Family Medicine

## 2023-07-11 DIAGNOSIS — R059 Cough, unspecified: Secondary | ICD-10-CM | POA: Diagnosis not present

## 2023-07-11 MED ORDER — BENZONATATE 200 MG PO CAPS: 200.0000 mg | ORAL_CAPSULE | Freq: Three times a day (TID) | ORAL | 0 refills | Status: AC | PRN

## 2023-07-11 NOTE — ED Provider Notes (Signed)
[name]    CSN: [id number] Arrival date & time: 07/11/23  1102      History   Chief Complaint Chief Complaint  Patient presents with   Cough    HPI CHINARA HERTZBERG is a 78 y.o. female.   HPI 78 year old female presents with cough, shortness of breath, nasal drainage and congestion with some dizziness and left ear ringing for 3 days.  PMH significant for CAD, A-fib, and HTN.  Patient has history of multiple drug allergies.  Past Medical History:  Diagnosis Date   Allergy 1995   Contrast dye   Anxiety    Arthritis 2023   Atrial fibrillation (HCC)    CAD (coronary artery disease)    CKD (chronic kidney disease) stage 3, GFR 30-59 ml/min (HCC) 08/04/2017   Claudication (HCC) 01/06/2008   Lower extremity dopplers - no evidence of arterial insufficiency, normal exam   Coronary artery disease    GERD (gastroesophageal reflux disease)    Heart murmur    Hyperlipidemia    Hypertension 11/30/2010   echo- EF 55%; normal w/ mildly sclerotic aortic valve   Hypertension 11/05/2011   renal dopplers - celiac artery and SMA >50% diameter reduction, R renal artery - mildly elevated velocities 1-59% diameter reduction, L renal artery normal   Nonspecific ST-T wave electrocardiographic changes 03/26/2011   R/Lmv - EF 74%, normal perfusion all regions, ST depression w/ Lexiscan infusion w/o assoc angina   Osteopenia    PAD (peripheral artery disease) (HCC)    Peripheral vascular disease (HCC)    PVD (peripheral vascular disease) (HCC) 11/05/2011   doppler - R/L brachial pressures essentially equal w/o inflow disease; L sublclavian/CCA bypass graft demonstrates patent flow, no evidence of significant stenosis   Sigmoid diverticulitis     Patient Active Problem List   Diagnosis Date Noted   IFG (impaired fasting glucose) 05/22/2023   Diverticulitis 03/03/2023   DDD (degenerative disc disease), cervical 06/21/2022   PAT (paroxysmal atrial tachycardia) (HCC)  03/01/2022   Bilateral knee pain 01/02/2022   Sprain of anterior talofibular ligament of left ankle 09/28/2020   Edema 09/03/2019   Epigastric pain 08/11/2019   Insomnia 08/10/2019   Hypocalcemia 06/03/2019   Atherosclerosis of aorta (HCC) 01/04/2018   Osteoporosis 12/25/2017   CKD (chronic kidney disease) stage 3, GFR 30-59 ml/min (HCC) 08/04/2017   Primary osteoarthritis of first carpometacarpal joint of left hand 07/10/2017   Strain of right Achilles tendon 04/14/2017   Atypical nevus 08/22/2016   Acute bilateral low back pain with left-sided sciatica 06/06/2016   Menopause 12/08/2015   History of colonoscopy 07/17/2015   Irregular heartbeat 07/26/2014   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   Irritable bowel syndrome 09/21/2013   Hyperlipidemia 04/05/2013   Hypertension 12/06/2012   Peripheral arterial disease (HCC) 12/06/2012   CAD, cath 2009 12/06/2012   Hyponatremia, improved holding diuretic 12/06/2012   Lymphocele of left arm 07/14/2012   Atherosclerosis of other specified arteries 04/02/2012   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   Stricture of artery (HCC) 10/02/2011   Allergic dermatitis 06/09/2011    Past Surgical History:  Procedure Laterality Date   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 CFX, RCA OK.   CATARACT EXTRACTION     CHOLECYSTECTOMY     EYE SURGERY     retinal surgery   SMALL INTESTINE SURGERY     SPINE SURGERY  07/2009   SUBCLAVIAN ARTERY STENT   1995   X's  several   SUBCLAVIAN ARTERY STENT Left 09/20/2008   LSA ISR 7x49mm Cordis Genesis on Opta premount, reduction from 80% to 0%   subclavian artery stents  01/23/2010   Left carotid to subclavian artery Bypass   VISCERAL ANGIOGRAM  01/17/2014   Procedure: VISCERAL ANGIOGRAM;  Surgeon: Chuck Hint, MD;  Location: [facility] CATH LAB;  Service: Cardiovascular;;    OB History   No obstetric history on file.      Home Medications    Prior to Admission medications   Medication Sig Start Date End Date Taking? Authorizing Provider  aspirin EC 81 MG tablet Take 81 mg by mouth daily.   Yes [provider]  atenolol (TENORMIN) 25 MG tablet TAKE 1 TABLET DAILY 06/03/23  Yes Croitoru, Mihai, MD  atorvastatin (LIPITOR) 10 MG tablet TAKE 1 TABLET DAILY AT 6 P.M. 07/01/22  Yes Croitoru, Mihai, MD  benzonatate (TESSALON) 200 MG capsule Take 1 capsule (200 mg total) by mouth 3 (three) times daily as needed for up to 7 days. 07/11/23 07/18/23 Yes Trevor Iha, FNP  candesartan (ATACAND) 4 MG tablet TAKE 1 TABLET IN THE MORNING AND AT BEDTIME 04/30/23  Yes Swinyer, Zachary George, NP  cholecalciferol (VITAMIN D) 1000 UNITS tablet Take 1,000 Units by mouth daily.   Yes [provider]  clopidogrel (PLAVIX) 75 MG tablet TAKE 1 TABLET DAILY 07/01/22  Yes Croitoru, Mihai, MD  clorazepate (TRANXENE) 7.5 MG tablet TAKE ONE-HALF (1/2) TO ONE TABLET DAILY AS NEEDED 05/22/23  Yes Agapito Games, MD  cyanocobalamin 1000 MCG tablet Take 1,000 mcg by mouth daily.   Yes [provider]  EPINEPHrine (EPIPEN 2-PAK) 0.3 mg/0.3 mL IJ SOAJ injection Inject 0.3 mLs (0.3 mg total) into the muscle as needed (for allergic reaction). 12/23/17  Yes Rodolph Bong, MD  esomeprazole (NEXIUM) 40 MG capsule Take 1 capsule (40 mg total) by mouth daily. Take 1 tab daily 02/23/15  Yes Croitoru, Mihai, MD  estradiol (ESTRACE) 0.5 MG tablet TAKE 1 TABLET DAILY 04/24/23  Yes Agapito Games, MD   furosemide (LASIX) 20 MG tablet TAKE 1 TABLET EVERY OTHER DAY Patient taking differently: Take 20 mg by mouth 3 (three) times a week. 12/03/22  Yes Croitoru, Mihai, MD  linaclotide (LINZESS) 145 MCG CAPS capsule Take 145 mcg by mouth as needed. constipation 12/22/15  Yes [provider]  meclizine (ANTIVERT) 25 MG tablet TAKE ONE-HALF (1/2) TABLET THREE TIMES A DAY AS NEEDED FOR DIZZINESS OR NAUSEA 04/17/23  Yes Agapito Games, MD  Multiple Vitamin (MULTIVITAMIN WITH MINERALS) TABS Take 1 tablet by mouth daily.   Yes [provider]  zolpidem (AMBIEN) 5 MG tablet Take 1 tablet (5 mg total) by mouth at bedtime as needed for sleep. 03/03/23  Yes Agapito Games, MD  nitrofurantoin, macrocrystal-monohydrate, (MACROBID) 100 MG capsule Take 1 capsule (100 mg total) by mouth 2 (two) times daily. 05/22/23   Agapito Games, MD    Family History Family History  Problem Relation Age of Onset  Heart disease Father 34       Heart Disease before age 107   Kidney disease Father    Heart attack Father    Hyperlipidemia Father    Hypertension Father    Alcohol abuse Father    Kidney failure Mother    Arthritis Mother    Diabetes Maternal Grandmother    Heart disease Paternal Grandfather     Social History Social History   Tobacco Use   Smoking status: Former    Current packs/day: 0.00    Average packs/day: 0.3 packs/day for 20.0 years (5.0 ttl pk-yrs)    Types: Cigarettes    Start date: 09/30/1973    Quit date: 09/30/1993    Years since quitting: 29.7   Smokeless tobacco: Never  Vaping Use   Vaping status: Never Used  Substance Use Topics   Alcohol use: No   Drug use: No     Allergies   Cortisone, Dilaudid [hydromorphone hcl], Iodine, Medrol [methylprednisolone], Omnipaque [iohexol], Prednisone, Shellfish allergy, Sulfa drugs cross reactors, Z-pak [azithromycin], Doxycycline, Ivp dye [iodinated contrast media], Levaquin [levofloxacin], Methylprednisolone  sodium succ, Sulfa antibiotics, Augmentin [amoxicillin-pot clavulanate], Codeine, Erythromycin, and Morphine and codeine   Review of Systems Review of Systems  HENT:  Positive for ear pain.   Respiratory:  Positive for cough and shortness of breath.   Neurological:  Positive for dizziness.  All other systems reviewed and are negative.    Physical Exam Triage Vital Signs ED Triage Vitals  Encounter Vitals Group     BP 07/11/23 1148 (!) 169/77     Systolic BP Percentile --      Diastolic BP Percentile --      Pulse Rate 07/11/23 1148 (!) 53     Resp 07/11/23 1148 18     Temp 07/11/23 1148 98.5 F (36.9 C)     Temp Source 07/11/23 1148 Oral     SpO2 07/11/23 1148 98 %     Weight 07/11/23 1150 147 lb (66.7 kg)     Height 07/11/23 1150 5\' 4"  (1.626 m)     Head Circumference --      Peak Flow --      Pain Score 07/11/23 1149 0     Pain Loc --      Pain Education --      Exclude from Growth Chart --    No data found.  Updated Vital Signs BP (!) 169/77 (BP Location: Right Arm)   Pulse (!) 53   Temp 98.5 F (36.9 C) (Oral)   Resp 18   Ht 5\' 4"  (1.626 m)   Wt 147 lb (66.7 kg)   SpO2 98%   BMI 25.23 kg/m   Physical Exam Vitals and nursing note reviewed.  Constitutional:      Appearance: Normal appearance. She is normal weight.  HENT:     Head: Normocephalic and atraumatic.     Right Ear: Tympanic membrane, ear canal and external ear normal.     Left Ear: Tympanic membrane, ear canal and external ear normal.     Mouth/Throat:     Mouth: Mucous membranes are moist.     Pharynx: Oropharynx is clear.  Eyes:     Extraocular Movements: Extraocular movements intact.     Conjunctiva/sclera: Conjunctivae normal.     Pupils: Pupils are equal, round, and reactive to light.  Cardiovascular:     Rate and Rhythm: Normal rate and regular rhythm.     Pulses: Normal pulses.  Heart sounds: Normal heart sounds.  Pulmonary:     Effort: Pulmonary effort is normal.     Breath  sounds: Normal breath sounds. No wheezing, rhonchi or rales.     Comments: Infrequent cough on exam Musculoskeletal:        General: Normal range of motion.     Cervical back: Normal range of motion and neck supple.  Skin:    General: Skin is warm and dry.  Neurological:     General: No focal deficit present.     Mental Status: She is alert and oriented to person, place, and time.  Psychiatric:        Mood and Affect: Mood normal.        Behavior: Behavior normal.      UC Treatments / Results  Labs (all labs ordered are listed, but only abnormal results are displayed) Labs Reviewed - No data to display  EKG   Radiology No results found.  Procedures Procedures (including critical care time)  Medications Ordered in UC Medications - No data to display  Initial Impression / Assessment and Plan / UC Course  I have reviewed the triage vital signs and the nursing notes.  Pertinent labs & imaging results that were available during my care of the patient were reviewed by me and considered in my medical decision making (see chart for details).     MDM: 1.  Cough, unspecified type-Rx'd Tessalon 200 mg capsule: Take 1 capsule 3 times daily, as needed for cough. Advised patient to take medication as directed daily or as needed for cough.  Encouraged to increase daily water intake to 64 ounces per day while taking this medication.  Advised if symptoms worsen and/or unresolved please follow-up with PCP or here for further evaluation.  Patient discharged home, hemodynamically stable. Final Clinical Impressions(s) / UC Diagnoses   Final diagnoses:  Cough, unspecified type     Discharge Instructions      Advised patient to take medication as directed daily or as needed for cough.  Encouraged to increase daily water intake to 64 ounces per day while taking this medication.  Advised if symptoms worsen and/or unresolved please follow-up with PCP or here for further  evaluation.     ED Prescriptions     Medication Sig Dispense Auth. Provider   benzonatate (TESSALON) 200 MG capsule Take 1 capsule (200 mg total) by mouth 3 (three) times daily as needed for up to 7 days. 40 capsule Trevor Iha, FNP      PDMP not reviewed this encounter.   Trevor Iha, FNP 07/11/23 1249

## 2023-07-11 NOTE — Discharge Instructions (Addendum)
Advised patient to take medication as directed daily or as needed for cough.  Encouraged to increase daily water intake to 64 ounces per day while taking this medication.  Advised if symptoms worsen and/or unresolved please follow-up with PCP or here for further evaluation.

## 2023-07-11 NOTE — ED Triage Notes (Signed)
Patient c/o dry cough, SOB, nasal drainage and congestion, some dizziness, left ear ringing x 3 days.  Patient has taken Tylenol, Chloraseptic, throat lozenges, hot tea.

## 2023-07-14 ENCOUNTER — Other Ambulatory Visit: Payer: Self-pay

## 2023-07-14 ENCOUNTER — Ambulatory Visit (INDEPENDENT_AMBULATORY_CARE_PROVIDER_SITE_OTHER): Payer: Medicare Other

## 2023-07-14 ENCOUNTER — Ambulatory Visit
Admission: EM | Admit: 2023-07-14 | Discharge: 2023-07-14 | Disposition: A | Discharge status: Home / Self Care | Payer: Medicare Other | Attending: Internal Medicine | Admitting: Internal Medicine

## 2023-07-14 ENCOUNTER — Ambulatory Visit: Payer: Self-pay | Admitting: Family Medicine

## 2023-07-14 DIAGNOSIS — R059 Cough, unspecified: Secondary | ICD-10-CM

## 2023-07-14 DIAGNOSIS — R051 Acute cough: Secondary | ICD-10-CM

## 2023-07-14 DIAGNOSIS — J069 Acute upper respiratory infection, unspecified: Secondary | ICD-10-CM

## 2023-07-14 DIAGNOSIS — I700 Atherosclerosis of aorta: Secondary | ICD-10-CM | POA: Diagnosis not present

## 2023-07-14 LAB — POC COVID19/FLU A&B COMBO
Covid Antigen, POC: NEGATIVE
Influenza A Antigen, POC: NEGATIVE
Influenza B Antigen, POC: NEGATIVE

## 2023-07-14 MED ORDER — AMOXICILLIN-POT CLAVULANATE 875-125 MG PO TABS: 1.0000 | ORAL_TABLET | Freq: Two times a day (BID) | ORAL | 0 refills | Status: DC

## 2023-07-14 NOTE — ED Triage Notes (Addendum)
Sore throat, cough, sob, no fever, wheezing x 3-4 days. Has taken tylenol and used chloraseptic spray. Reports her primary care could not see her so wanted her to come in, "probably need an xray"

## 2023-07-14 NOTE — Telephone Encounter (Signed)
Chief Complaint: Shortness of breath Symptoms: Mild SOB, dry cough, wheezing, sore throat, sneezing Frequency: Intermittent Pertinent Negatives: Patient denies chest pain, fever, sputum, tachycardia Disposition: [] ED /[x] Urgent Care (no appt availability in office) / [] Appointment(In office/virtual)/ []  [place] [facility]/ [] Home Care/ [] Refused Recommended Disposition /[] [place] [facility]/ []  Follow-up with PCP Additional Notes: Patient called with complaints of sore throat, dry cough, difficulty breathing, and sneezing. Patient states she is having a procedure soon and wants to make sure that her symptoms don't get worse and goes into her lungs. Symptoms ongoing for 4 days; patient states that her nagging cough is dry, and has periods of wheezing. No lung history. Patient denies fever, productive cough and tachycardia. Patient states that SOB has sometimes been occurring with walking. Severity is described as mild, but new onset. Patient advised to be [facility] at [facility] per protocol within 4 hrs as no availability in office. Patient agreeable but states that she will call back and not be seen if the provider with whom she previously had a bad experience with will be the provider seeing her. Patient instructed to call back with worsening symptoms if she is not treated. Patient verbalized understanding   Copied from CRM [id number]. Topic: Clinical - Red Word Triage >> Jul 14, 2023  2:09 PM Elle L wrote: Red Word that prompted transfer to Nurse Triage: The patient is having chest pain and shortness of breath. She believes it may be bronchitis but isn't sure. Reason for Disposition  [1] MILD difficulty breathing (e.g., minimal/no SOB at rest, SOB with walking, pulse <100) AND [2] NEW-onset or WORSE than normal  Answer Assessment - Initial Assessment Questions 1. RESPIRATORY STATUS: "Describe your breathing?" (e.g., wheezing, shortness of breath, unable to speak, severe coughing)      Wheezing  sometimes. "I have this cough that's very dry and I dont know if its making my chest sore, but I dont' have chest pain like it my heart or anything." SOB sometimes with walking. 2. ONSET: "When did this breathing problem begin?"      x4 days 3. PATTERN "Does the difficult breathing come and go, or has it been constant since it started?"      Comes and goes 4. SEVERITY: "How bad is your breathing?" (e.g., mild, moderate, severe)    - MILD: No SOB at rest, mild SOB with walking, speaks normally in sentences, can lie down, no retractions, pulse < 100.    - MODERATE: SOB at rest, SOB with minimal exertion and prefers to sit, cannot lie down flat, speaks in phrases, mild retractions, audible wheezing, pulse 100-120.    - SEVERE: Very SOB at rest, speaks in single words, struggling to breathe, sitting hunched forward, retractions, pulse > 120      Mild 5. RECURRENT SYMPTOM: "Have you had difficulty breathing before?" If Yes, ask: "When was the last time?" and "What happened that time?"      Denies 6. CARDIAC HISTORY: "Do you have any history of heart disease?" (e.g., heart attack, angina, bypass surgery, angioplasty)      Denies 7. LUNG HISTORY: "Do you have any history of lung disease?"  (e.g., pulmonary embolus, asthma, emphysema)     Denies 8. CAUSE: "What do you think is causing the breathing problem?"      "I'm not sure, it might be viral." 9. OTHER SYMPTOMS: "Do you have any other symptoms? (e.g., dizziness, runny nose, cough, chest pain, fever)     Sneezing, sore throat, cough 10. O2  SATURATION MONITOR:  "Do you use an oxygen saturation monitor (pulse oximeter) at home?" If Yes, ask: "What is your reading (oxygen level) today?" "What is your usual oxygen saturation reading?" (e.g., 95%)       97% - 98% 12. TRAVEL: "Have you traveled out of the country in the last month?" (e.g., travel history, exposures)       Denies  Protocols used: Breathing Difficulty-A-AH

## 2023-07-14 NOTE — Discharge Instructions (Addendum)
Flu A, flu B and COVID are negative.  Chest x-ray done today shows no evidence of pneumonia.  Likely symptoms are secondary to a upper respiratory infection.  Given the duration and severity of symptoms we will treat with the following: Augmentin 875 mg twice daily for 7 days Continue the Tessalon medication for cough Rest and stay hydrated Return to urgent care or PCP if symptoms worsen or fail to resolve.

## 2023-07-14 NOTE — ED Provider Notes (Signed)
[facility]    CSN: [id number] Arrival date & time: 07/14/23  1515      History   Chief Complaint No chief complaint on file.   HPI Traci [name] is a 78 y.o. female.   78 y.o. female who presents to urgent care with complaints of persistent cough, shortness of breath, wheezing and sore throat.  She was seen 3 days ago here with similar symptoms but reports that her symptoms are getting no better.  She is scheduled to have surgery in a month and is wanting to make sure she does not have an infection prior to this.  She has now been having symptoms for almost a week.  She reports that she does not feel well and that the cough seems to be worse at night.  She denies fevers, chills, abdominal pain that is new, nausea, vomiting.     Past Medical History:  Diagnosis Date   Allergy 1995   Contrast dye   Anxiety    Arthritis 2023   Atrial fibrillation (HCC)    CAD (coronary artery disease)    CKD (chronic kidney disease) stage 3, GFR 30-59 ml/min (HCC) 08/04/2017   Claudication (HCC) 01/06/2008   Lower extremity dopplers - no evidence of arterial insufficiency, normal exam   Coronary artery disease    GERD (gastroesophageal reflux disease)    Heart murmur    Hyperlipidemia    Hypertension 11/30/2010   echo- EF 55%; normal w/ mildly sclerotic aortic valve   Hypertension 11/05/2011   renal dopplers - celiac artery and SMA >50% diameter reduction, R renal artery - mildly elevated velocities 1-59% diameter reduction, L renal artery normal   Nonspecific ST-T wave electrocardiographic changes 03/26/2011   R/Lmv - EF 74%, normal perfusion all regions, ST depression w/ Lexiscan infusion w/o assoc angina   Osteopenia    PAD (peripheral artery disease) (HCC)    Peripheral vascular disease (HCC)    PVD (peripheral vascular disease) (HCC) 11/05/2011   doppler - R/L brachial pressures essentially equal w/o inflow disease; L sublclavian/CCA bypass graft demonstrates  patent flow, no evidence of significant stenosis   Sigmoid diverticulitis     Patient Active Problem List   Diagnosis Date Noted   IFG (impaired fasting glucose) 05/22/2023   Diverticulitis 03/03/2023   DDD (degenerative disc disease), cervical 06/21/2022   PAT (paroxysmal atrial tachycardia) (HCC) 03/01/2022   Bilateral knee pain 01/02/2022   Sprain of anterior talofibular ligament of left ankle 09/28/2020   Edema 09/03/2019   Epigastric pain 08/11/2019   Insomnia 08/10/2019   Hypocalcemia 06/03/2019   Atherosclerosis of aorta (HCC) 01/04/2018   Osteoporosis 12/25/2017   CKD (chronic kidney disease) stage 3, GFR 30-59 ml/min (HCC) 08/04/2017   Primary osteoarthritis of first carpometacarpal joint of left hand 07/10/2017   Strain of right Achilles tendon 04/14/2017   Atypical nevus 08/22/2016   Acute bilateral low back pain with left-sided sciatica 06/06/2016   Menopause 12/08/2015   History of colonoscopy 07/17/2015   Irregular heartbeat 07/26/2014   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   Irritable bowel syndrome 09/21/2013   Hyperlipidemia 04/05/2013   Hypertension 12/06/2012   Peripheral arterial disease (HCC) 12/06/2012   CAD, cath 2009 12/06/2012   Hyponatremia, improved holding diuretic 12/06/2012   Lymphocele of left arm 07/14/2012   Atherosclerosis of other specified arteries 04/02/2012   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   Stricture of artery (HCC) 10/02/2011   Allergic dermatitis  06/09/2011    Past Surgical History:  Procedure Laterality Date   ABDOMINAL AORTIC ANEURYSM REPAIR N/A 01/27/2014   Procedure: AORTO-SUPERIOR MESENTERIC ARTERY BYPASS GRAFT;  Surgeon: Chuck Hint, MD;  Location: [facility] OR;  Service: Vascular;  Laterality: N/A;   ABDOMINAL HYSTERECTOMY     APPENDECTOMY     BREAST BIOPSY Right 03/18/2023   [place] RT BREAST BX W LOC DEV 1ST LESION IMG BX SPEC US GUIDE 03/18/2023  GI-[facility] MAMMOGRAPHY   CARDIAC CATHETERIZATION  06/03/2008   60% LAD, involving D2, borderline significant by IVUS, medical therapy. CFX, RCA OK.   CATARACT EXTRACTION     CHOLECYSTECTOMY     EYE SURGERY     retinal surgery   SMALL INTESTINE SURGERY     SPINE SURGERY  07/2009   SUBCLAVIAN ARTERY STENT  1995   X's  several   SUBCLAVIAN ARTERY STENT Left 09/20/2008   LSA ISR 7x63mm Cordis Genesis on Opta premount, reduction from 80% to 0%   subclavian artery stents  01/23/2010   Left carotid to subclavian artery Bypass   VISCERAL ANGIOGRAM  01/17/2014   Procedure: VISCERAL ANGIOGRAM;  Surgeon: Chuck Hint, MD;  Location[facility] CATH LAB;  Service: Cardiovascular;;    OB History   No obstetric history on file.      Home Medications    Prior to Admission medications   Medication Sig Start Date End Date Taking? Authorizing Provider  aspirin EC 81 MG tablet Take 81 mg by mouth daily.    [provider]  atenolol (TENORMIN) 25 MG tablet TAKE 1 TABLET DAILY 06/03/23   Croitoru, Mihai, MD  atorvastatin (LIPITOR) 10 MG tablet TAKE 1 TABLET DAILY AT 6 P.M. 07/01/22   Croitoru, Rachelle Hora, MD  benzonatate (TESSALON) 200 MG capsule Take 1 capsule (200 mg total) by mouth 3 (three) times daily as needed for up to 7 days. 07/11/23 07/18/23  Trevor Iha, FNP  candesartan (ATACAND) 4 MG tablet TAKE 1 TABLET IN THE MORNING AND AT BEDTIME 04/30/23   Swinyer, Zachary George, NP  cholecalciferol (VITAMIN D) 1000 UNITS tablet Take 1,000 Units by mouth daily.    [provider]  clopidogrel (PLAVIX) 75 MG tablet TAKE 1 TABLET DAILY 07/01/22   Croitoru, Mihai, MD  clorazepate (TRANXENE) 7.5 MG tablet TAKE ONE-HALF (1/2) TO ONE TABLET DAILY AS NEEDED 05/22/23   Agapito Games, MD  cyanocobalamin 1000 MCG tablet Take 1,000 mcg by mouth daily.    [provider]  EPINEPHrine (EPIPEN 2-PAK) 0.3 mg/0.3 mL IJ SOAJ injection Inject 0.3 mLs (0.3 mg total) into the muscle as needed  (for allergic reaction). 12/23/17   Rodolph Bong, MD  esomeprazole (NEXIUM) 40 MG capsule Take 1 capsule (40 mg total) by mouth daily. Take 1 tab daily 02/23/15   Croitoru, Mihai, MD  estradiol (ESTRACE) 0.5 MG tablet TAKE 1 TABLET DAILY 04/24/23   Agapito Games, MD  furosemide (LASIX) 20 MG tablet TAKE 1 TABLET EVERY OTHER DAY Patient taking differently: Take 20 mg by mouth 3 (three) times a week. 12/03/22   Croitoru, Mihai, MD  linaclotide (LINZESS) 145 MCG CAPS capsule Take 145 mcg by mouth as needed. constipation 12/22/15   [provider]  meclizine (ANTIVERT) 25 MG tablet TAKE ONE-HALF (1/2) TABLET THREE TIMES A DAY AS NEEDED FOR DIZZINESS OR NAUSEA 04/17/23   Agapito Games, MD  Multiple Vitamin (MULTIVITAMIN WITH MINERALS) TABS Take 1 tablet by mouth daily.    [provider]  nitrofurantoin,  macrocrystal-monohydrate, (MACROBID) 100 MG capsule Take 1 capsule (100 mg total) by mouth 2 (two) times daily. 05/22/23   Agapito Games, MD  zolpidem (AMBIEN) 5 MG tablet Take 1 tablet (5 mg total) by mouth at bedtime as needed for sleep. 03/03/23   Agapito Games, MD    Family History Family History  Problem Relation Age of Onset   Heart disease Father 40       Heart Disease before age 97   Kidney disease Father    Heart attack Father    Hyperlipidemia Father    Hypertension Father    Alcohol abuse Father    Kidney failure Mother    Arthritis Mother    Diabetes Maternal Grandmother    Heart disease Paternal Grandfather     Social History Social History   Tobacco Use   Smoking status: Former    Current packs/day: 0.00    Average packs/day: 0.3 packs/day for 20.0 years (5.0 ttl pk-yrs)    Types: Cigarettes    Start date: 09/30/1973    Quit date: 09/30/1993    Years since quitting: 29.8   Smokeless tobacco: Never  Vaping Use   Vaping status: Never Used  Substance Use Topics   Alcohol use: No   Drug use: No     Allergies   Cortisone,  Dilaudid [hydromorphone hcl], Iodine, Medrol [methylprednisolone], Omnipaque [iohexol], Prednisone, Shellfish allergy, Sulfa drugs cross reactors, Z-pak [azithromycin], Doxycycline, Ivp dye [iodinated contrast media], Levaquin [levofloxacin], Methylprednisolone sodium succ, Sulfa antibiotics, Augmentin [amoxicillin-pot clavulanate], Codeine, Erythromycin, and Morphine and codeine   Review of Systems Review of Systems  Constitutional:  Negative for chills and fever.  HENT:  Positive for sore throat. Negative for ear pain.   Eyes:  Negative for pain and visual disturbance.  Respiratory:  Positive for cough, shortness of breath and wheezing.   Cardiovascular:  Negative for chest pain and palpitations.  Gastrointestinal:  Negative for abdominal pain and vomiting.  Genitourinary:  Negative for dysuria and hematuria.  Musculoskeletal:  Negative for arthralgias and back pain.  Skin:  Negative for color change and rash.  Neurological:  Negative for seizures and syncope.  All other systems reviewed and are negative.    Physical Exam Triage Vital Signs ED Triage Vitals  Encounter Vitals Group     BP 07/14/23 1537 (!) 188/74     Systolic BP Percentile --      Diastolic BP Percentile --      Pulse Rate 07/14/23 1537 63     Resp 07/14/23 1537 16     Temp 07/14/23 1537 98.1 F (36.7 C)     Temp src --      SpO2 07/14/23 1537 97 %     Weight --      Height --      Head Circumference --      Peak Flow --      Pain Score 07/14/23 1539 4     Pain Loc --      Pain Education --      Exclude from Growth Chart --    No data found.  Updated Vital Signs BP (!) 188/74   Pulse 63   Temp 98.1 F (36.7 C)   Resp 16   SpO2 97%   Visual Acuity Right Eye Distance:   Left Eye Distance:   Bilateral Distance:    Right Eye Near:   Left Eye Near:    Bilateral Near:     Physical Exam Vitals and  nursing note reviewed.  Constitutional:      General: She is not in acute distress.     Appearance: She is well-developed.  HENT:     Head: Normocephalic and atraumatic.  Eyes:     Conjunctiva/sclera: Conjunctivae normal.  Cardiovascular:     Rate and Rhythm: Normal rate and regular rhythm.     Heart sounds: No murmur heard. Pulmonary:     Effort: Pulmonary effort is normal. No respiratory distress.     Breath sounds: Examination of the right-upper field reveals decreased breath sounds. Examination of the right-middle field reveals decreased breath sounds. Decreased breath sounds present. No wheezing or rhonchi.  Abdominal:     Palpations: Abdomen is soft.     Tenderness: There is no abdominal tenderness.  Musculoskeletal:        General: No swelling.     Cervical back: Neck supple.  Skin:    General: Skin is warm and dry.     Capillary Refill: Capillary refill takes less than 2 seconds.  Neurological:     Mental Status: She is alert.  Psychiatric:        Mood and Affect: Mood normal.      UC Treatments / Results  Labs (all labs ordered are listed, but only abnormal results are displayed) Labs Reviewed  POC COVID19/FLU A&B COMBO    EKG   Radiology No results found.  Procedures Procedures (including critical care time)  Medications Ordered in UC Medications - No data to display  Initial Impression / Assessment and Plan / UC Course  I have reviewed the triage vital signs and the nursing notes.  Pertinent labs & imaging results that were available during my care of the patient were reviewed by me and considered in my medical decision making (see chart for details).     Acute cough - Plan: DG Chest 2 View, DG Chest 2 View  Acute upper respiratory infection  Flu A, flu B and COVID are negative.  Chest x-ray done today shows no evidence of pneumonia.  Likely symptoms are secondary to a upper respiratory infection.  Given the duration and severity of symptoms we will treat with the following: Augmentin 875 mg twice daily for 7 days Continue the  Tessalon medication for cough Rest and stay hydrated Return to urgent care or PCP if symptoms worsen or fail to resolve.     Final Clinical Impressions(s) / UC Diagnoses   Final diagnoses:  Acute cough   Discharge Instructions   None    ED Prescriptions   None    PDMP not reviewed this encounter.   Landis Martins, [place] 07/14/23 1751

## 2023-07-28 DIAGNOSIS — Z01818 Encounter for other preprocedural examination: Secondary | ICD-10-CM | POA: Diagnosis not present

## 2023-07-28 DIAGNOSIS — Z01812 Encounter for preprocedural laboratory examination: Secondary | ICD-10-CM | POA: Diagnosis not present

## 2023-07-28 DIAGNOSIS — R799 Abnormal finding of blood chemistry, unspecified: Secondary | ICD-10-CM | POA: Diagnosis not present

## 2023-07-30 ENCOUNTER — Other Ambulatory Visit: Payer: Self-pay | Admitting: Cardiovascular Disease

## 2023-07-30 DIAGNOSIS — K5792 Diverticulitis of intestine, part unspecified, without perforation or abscess without bleeding: Secondary | ICD-10-CM | POA: Diagnosis not present

## 2023-08-11 DIAGNOSIS — Z7901 Long term (current) use of anticoagulants: Secondary | ICD-10-CM | POA: Diagnosis not present

## 2023-08-11 DIAGNOSIS — N1831 Chronic kidney disease, stage 3a: Secondary | ICD-10-CM | POA: Diagnosis not present

## 2023-08-11 DIAGNOSIS — N189 Chronic kidney disease, unspecified: Secondary | ICD-10-CM | POA: Diagnosis not present

## 2023-08-11 DIAGNOSIS — T360X5A Adverse effect of penicillins, initial encounter: Secondary | ICD-10-CM | POA: Diagnosis not present

## 2023-08-11 DIAGNOSIS — K521 Toxic gastroenteritis and colitis: Secondary | ICD-10-CM | POA: Diagnosis not present

## 2023-08-11 DIAGNOSIS — I499 Cardiac arrhythmia, unspecified: Secondary | ICD-10-CM | POA: Diagnosis not present

## 2023-08-11 DIAGNOSIS — D62 Acute posthemorrhagic anemia: Secondary | ICD-10-CM | POA: Diagnosis not present

## 2023-08-11 DIAGNOSIS — R918 Other nonspecific abnormal finding of lung field: Secondary | ICD-10-CM | POA: Diagnosis not present

## 2023-08-11 DIAGNOSIS — N183 Chronic kidney disease, stage 3 unspecified: Secondary | ICD-10-CM | POA: Diagnosis not present

## 2023-08-11 DIAGNOSIS — N736 Female pelvic peritoneal adhesions (postinfective): Secondary | ICD-10-CM | POA: Diagnosis not present

## 2023-08-11 DIAGNOSIS — J90 Pleural effusion, not elsewhere classified: Secondary | ICD-10-CM | POA: Diagnosis not present

## 2023-08-11 DIAGNOSIS — J9811 Atelectasis: Secondary | ICD-10-CM | POA: Diagnosis not present

## 2023-08-11 DIAGNOSIS — J9601 Acute respiratory failure with hypoxia: Secondary | ICD-10-CM | POA: Diagnosis not present

## 2023-08-11 DIAGNOSIS — K567 Ileus, unspecified: Secondary | ICD-10-CM | POA: Diagnosis not present

## 2023-08-11 DIAGNOSIS — K5730 Diverticulosis of large intestine without perforation or abscess without bleeding: Secondary | ICD-10-CM | POA: Diagnosis not present

## 2023-08-11 DIAGNOSIS — I480 Paroxysmal atrial fibrillation: Secondary | ICD-10-CM | POA: Diagnosis not present

## 2023-08-11 DIAGNOSIS — R059 Cough, unspecified: Secondary | ICD-10-CM | POA: Diagnosis not present

## 2023-08-11 DIAGNOSIS — T361X5A Adverse effect of cephalosporins and other beta-lactam antibiotics, initial encounter: Secondary | ICD-10-CM | POA: Diagnosis not present

## 2023-08-11 DIAGNOSIS — R0902 Hypoxemia: Secondary | ICD-10-CM | POA: Diagnosis not present

## 2023-08-11 DIAGNOSIS — I2510 Atherosclerotic heart disease of native coronary artery without angina pectoris: Secondary | ICD-10-CM | POA: Diagnosis not present

## 2023-08-11 DIAGNOSIS — I10 Essential (primary) hypertension: Secondary | ICD-10-CM | POA: Diagnosis not present

## 2023-08-11 DIAGNOSIS — J1569 Pneumonia due to other gram-negative bacteria: Secondary | ICD-10-CM | POA: Diagnosis not present

## 2023-08-11 DIAGNOSIS — I129 Hypertensive chronic kidney disease with stage 1 through stage 4 chronic kidney disease, or unspecified chronic kidney disease: Secondary | ICD-10-CM | POA: Diagnosis not present

## 2023-08-11 DIAGNOSIS — J189 Pneumonia, unspecified organism: Secondary | ICD-10-CM | POA: Diagnosis not present

## 2023-08-11 DIAGNOSIS — K5732 Diverticulitis of large intestine without perforation or abscess without bleeding: Secondary | ICD-10-CM | POA: Diagnosis not present

## 2023-08-11 DIAGNOSIS — K219 Gastro-esophageal reflux disease without esophagitis: Secondary | ICD-10-CM | POA: Diagnosis not present

## 2023-08-13 DIAGNOSIS — K5732 Diverticulitis of large intestine without perforation or abscess without bleeding: Secondary | ICD-10-CM | POA: Diagnosis not present

## 2023-08-24 ENCOUNTER — Other Ambulatory Visit: Payer: Self-pay | Admitting: Family Medicine

## 2023-08-24 DIAGNOSIS — G4700 Insomnia, unspecified: Secondary | ICD-10-CM

## 2023-08-25 ENCOUNTER — Telehealth: Payer: Self-pay

## 2023-08-25 NOTE — Transitions of Care (Post Inpatient/ED Visit) (Signed)
 08/25/2023  Name: Traci [name] MRN: [id number] DOB: Oct 15, 1945  Today's TOC FU Call Status: Today's TOC FU Call Status:: Successful TOC FU Call Completed TOC FU Call Complete Date: 08/25/23 Patient's Name and Date of Birth confirmed.  Transition Care Management Follow-up Telephone Call Date of Discharge: 08/22/23 Discharge Facility: Other ([facility]) Name of Other ([facility]) [facility]charge Facility: [facility] Type of Discharge: Inpatient Admission Primary Inpatient Discharge Diagnosis:: Sigmoid diverticulitis s/p open sigmoid colectomy 08/11/23 How have you been since you were released from the hospital?: Better Any questions or concerns?: No (Patient denies questions/concerns)  Items Reviewed: Did you receive and understand the discharge instructions provided?: Yes Medications obtained,verified, and reconciled?: Yes (Medications Reviewed) Any new allergies since your discharge?: No Dietary orders reviewed?: Yes Type of Diet Ordered:: Soft GI diet for 2 weeks and advance as tolerated Do you have support at home?: Yes People in Home: child(ren), adult Name of Support/Comfort Primary Source: adult son lives with her and lots of friend around who help as needed  Medications Reviewed Today: Medications Reviewed Today     Reviewed by Jessy Oto, RN (Registered Nurse) on 08/25/23 at 1056  Med List Status: <None>   Medication Order Taking? Sig Documenting Provider Last Dose Status Informant  amoxicillin-clavulanate (AUGMENTIN) 875-125 MG tablet [id number]  Take 1 tablet by mouth every 12 (twelve) hours. Landis Martins, PA-C  Consider Medication Status and Discontinue (Completed Course)   aspirin EC 81 MG tablet [id number] Yes Take 81 mg by mouth daily. [provider] Taking Active Self  atenolol (TENORMIN) 25 MG tablet [id number] Yes TAKE 1 TABLET DAILY Croitoru, Mihai, MD Taking Active            Med Note [facility], Karilyn Wind A   Mon Aug 25, 2023  10:52 AM) Patient reports dose was increased to 50mg    atorvastatin (LIPITOR) 10 MG tablet [id number] Yes TAKE 1 TABLET DAILY AT 6 P.M. Croitoru, Mihai, MD Taking Active   candesartan (ATACAND) 4 MG tablet [id number] Yes TAKE 1 TABLET IN THE MORNING AND AT BEDTIME Swinyer, Zachary George, NP Taking Active            Med Note [facility], Eluzer Howdeshell A   Mon Aug 25, 2023 10:52 AM) Patient reports dose increased to 8mg    cholecalciferol (VITAMIN D) 1000 UNITS tablet [id number] No Take 1,000 Units by mouth daily.  Patient not taking: Reported on 08/25/2023   [provider] Not Taking Active Self  clopidogrel (PLAVIX) 75 MG tablet [id number] Yes TAKE 1 TABLET DAILY Croitoru, Mihai, MD Taking Active   clorazepate (TRANXENE) 7.5 MG tablet [id number] No TAKE ONE-HALF (1/2) TO ONE TABLET DAILY AS NEEDED  Patient not taking: Reported on 08/25/2023   Agapito Games, MD Not Taking Active   cyanocobalamin 1000 MCG tablet [id number] No Take 1,000 mcg by mouth daily.  Patient not taking: Reported on 08/25/2023   [provider] Not Taking Active Self           Med Note Katrinka Blazing, JEFFREY W   Sat Sep 07, 2016  4:41 AM)    EPINEPHrine (EPIPEN 2-PAK) 0.3 mg/0.3 mL IJ SOAJ injection [id number] No Inject 0.3 mLs (0.3 mg total) into the muscle as needed (for allergic reaction).  Patient not taking: Reported on 08/25/2023   Rodolph Bong, MD Not Taking Active Self  esomeprazole (NEXIUM) 40 MG capsule [id number] Yes Take 1 capsule (40 mg total) by mouth daily. Take 1 tab daily Croitoru, Mihai, MD Taking Active  Med Note Tillie Fantasia May 13, 2023  9:12 AM) Patient takes 3 times week  estradiol (ESTRACE) 0.5 MG tablet [id number] Yes TAKE 1 TABLET DAILY Agapito Games, MD Taking Active   furosemide (LASIX) 20 MG tablet [id number] Yes TAKE 1 TABLET EVERY OTHER DAY  Patient taking differently: Take 20 mg by mouth 3 (three) times a week.   Croitoru, Mihai, MD Taking Active   linaclotide  [facility]) 145 MCG CAPS capsule [id number] No Take 145 mcg by mouth as needed. constipation  Patient not taking: Reported on 08/25/2023   [provider] Not Taking Active Self           Med Note Lucianne Lei May 02, 2022  2:40 PM)    meclizine (ANTIVERT) 25 MG tablet [id number] No TAKE ONE-HALF (1/2) TABLET THREE TIMES A DAY AS NEEDED FOR DIZZINESS OR NAUSEA  Patient not taking: Reported on 08/25/2023   Agapito Games, MD Not Taking Active   Multiple Vitamin (MULTIVITAMIN WITH MINERALS) TABS [id number] No Take 1 tablet by mouth daily.  Patient not taking: Reported on 08/25/2023   [provider] Not Taking Active Self  nitrofurantoin, macrocrystal-monohydrate, (MACROBID) 100 MG capsule [id number]  Take 1 capsule (100 mg total) by mouth 2 (two) times daily. Agapito Games, MD  Consider Medication Status and Discontinue (Completed Course)   zolpidem (AMBIEN) 5 MG tablet [id number] Yes Take 1 tablet (5 mg total) by mouth at bedtime as needed for sleep. Agapito Games, MD Taking Active             Home Care and Equipment/Supplies: Were Home Health Services Ordered?: No Any new equipment or medical supplies ordered?: Yes Name of Medical supply agency?: Patient reports oxygen was supplied by [employer] - 2 liters w/portable tank and concentrator Were you able to get the equipment/medical supplies?: Yes Do you have any questions related to the use of the equipment/supplies?: No  Functional Questionnaire: Do you need assistance with bathing/showering or dressing?: No Do you need assistance with meal preparation?: No Do you need assistance with eating?: No Do you have difficulty maintaining continence: No Do you need assistance with getting out of bed/getting out of a chair/moving?: No Do you have difficulty managing or taking your medications?: No  Follow up appointments reviewed: PCP Follow-up appointment confirmed?: No (Patient states she will  make the appointment) MD Provider Line Number:[phone] Given: No Specialist Hospital Follow-up appointment confirmed?: Yes Date of Specialist follow-up appointment?: 08/28/23 Follow-Up Specialty Provider:: Staple removal with PA at Dr Mistrot's office Do you need transportation to your follow-up appointment?: No (Patient states a friend will drive her to appointments but normally drives) Do you understand care options if your condition(s) worsen?: Yes-patient verbalized understanding  SDOH Interventions Today    Flowsheet Row Most Recent Value  SDOH Interventions   Food Insecurity Interventions Intervention Not Indicated  Housing Interventions Intervention Not Indicated  Transportation Interventions Intervention Not Indicated  Utilities Interventions Intervention Not Indicated       Goals Addressed               This Visit's Progress     TOC Care Plan - Patient will report no readmissions in the next 30 days (pt-stated)        Current Barriers:  Medication management: Patient with medication changes and will review need for continued change in dose with PCP  RNCM Clinical Goal(s):  Patient will work with the Care Management team  over the next 30 days to address Transition of Care Barriers: Medication Management Patient will take all medications exactly as prescribed and will call provider for medication related questions as evidenced by patient report and health record Patient will attend all scheduled medical appointments: CM offered but patient states she will call and schedule her PCP hospital follow up and has appointment 08/28/23 for staple removal Patient will discuss with PCP if she needs to see pulmonology related to hospital acquired pneumonia with need for O2 at 2 liters   Interventions: Evaluation of current treatment plan related to  self management and patient's adherence to plan as established by provider  Transitions of Care:  New goal. Doctor Visits  -  discussed the importance of doctor visits  Management of HTN  (Status:  New goal.)  Short Term Goal Evaluation of current treatment plan related to HTN,  Advised patient to take discharge papers to PCP appointment and discuss dose changes with Atenolol and Atacand Provided education to patient re: education provided regarding checking BP with recent dose changes of BP medications Reviewed medications with patient and discussed Patient reported dose change with Atenolol and Atacand and will review with PCP. Patient also reports she has not started her vitamins yet.   Surgery (Post Sigmoid colectomy):  (Status: New goal.) Short Term Goal reviewed post-operative instructions with patient/caregiver reviewed scheduled provider appointments with patient: Patient has appointment 08/28/23 for staple removal and 10/02/23 with surgeon, Dr Clover Mealy  Patient Goals/Self-Care Activities: Participate in Transition of Care Program/Attend [facility] scheduled calls Notify RN Care Manager of [facility] call rescheduling needs Take all medications as prescribed Attend all scheduled provider appointments Call provider office for new concerns or questions and to schedule hospital follow up Monitor incision for signs/symptoms of infection and report to MD immediately Continue Oxygen at 2 liters - call if O2 sat drops below 90% or with shortness of breath Discuss need for pulmonary referral, weaning off oxygen at PCP appointment and take discharge papers to appointment  Follow Up Plan:  Telephone follow up appointment with care management team member scheduled for:  09/02/23 11am The patient has been provided with contact information for the care management team and has been advised to call with any health related questions or concerns.           Hilbert Odor RN, CCM   [facility]BC[facility]Population Health RN Care Manager [phone]

## 2023-08-27 ENCOUNTER — Other Ambulatory Visit: Payer: Self-pay | Admitting: Family Medicine

## 2023-09-02 ENCOUNTER — Telehealth: Payer: Self-pay

## 2023-09-02 ENCOUNTER — Other Ambulatory Visit: Payer: Self-pay

## 2023-09-02 NOTE — Telephone Encounter (Signed)
 Please advise, thanks.

## 2023-09-02 NOTE — Telephone Encounter (Signed)
 Copied from CRM [id number]. Topic: Clinical - Medical Advice >> Sep 02, 2023  2:49 PM Haroldine Laws wrote: Reason for CRM: pt called saying she has a hospital fu next week.  She wants to know if she will need a chest xray and labs and if she does can those been done before her appt.

## 2023-09-02 NOTE — Patient Outreach (Signed)
 Care Management  Transitions of Care Program Transitions of Care Post-discharge week 2 Case Closure Note   09/02/2023 Name: Traci [name] MRN: [id number] DOB: 12-25-1945  Subjective: Traci [name] is a 78 y.o. year old female who is a primary care patient of Agapito Games, MD. The Care Management team Engaged with patient Engaged with patient by telephone to assess and address transitions of care needs.   Consent to Services:  Patient was given information about care management services, agreed to services, and gave verbal consent to participate.   Assessment:     TOC spoke with patient who confirmed she had staples removed 3/13 as planned. Patient reports she is currently 95% on room air and continues to wean off. Patient denied need to continue with [facility] program and states she will continue with provider follow up and has PCP appointment next week. Unable to complete medication review or TOC assessment today. Will close [facility] program per patient request      SDOH Interventions    Flowsheet Row Telephone from 08/25/2023 in [place] POPULATION HEALTH DEPARTMENT Office Visit from 08/29/2022 in [facility] Primary Care & Sports Medicine at [facility] Coordination from 05/13/2022 in [facility] Community Care Coordination Office Visit from 07/20/2021 in [facility] Primary Care & Sports Medicine at [facility] Office Visit from 06/21/2020 in [facility] Primary Care & Sports Medicine at [facility]  SDOH Interventions       Food Insecurity Interventions Intervention Not Indicated -- -- Intervention Not Indicated Intervention Not Indicated  Housing Interventions Intervention Not Indicated -- -- Intervention Not Indicated Intervention Not Indicated  Transportation Interventions Intervention Not Indicated -- -- Intervention Not Indicated Intervention Not Indicated  Utilities Interventions Intervention Not Indicated -- --  -- --  Depression Interventions/Treatment  -- Counseling -- -- --  Financial Strain Interventions -- -- -- Intervention Not Indicated Intervention Not Indicated  Physical Activity Interventions -- -- Other (Comments)  [client has knee pain issues] Intervention Not Indicated Intervention Not Indicated  Stress Interventions -- -- -- Intervention Not Indicated Intervention Not Indicated  Social Connections Interventions -- -- -- Intervention Not Indicated Intervention Not Indicated        Goals Addressed               This Visit's Progress     TOC Care Plan - Patient will report no readmissions in the next 30 days (pt-stated)        Current Barriers: 09/02/23 Patient had staples removed 08/28/23 and feels she is doing well and denied need for continued TOC program  Medication management: Patient with medication changes and will review need for continued change in dose with PCP  RNCM Clinical Goal(s):  Patient will work with the Care Management team over the next 30 days to address Transition of Care Barriers: Medication Management Patient will take all medications exactly as prescribed and will call provider for medication related questions as evidenced by patient report and health record Patient will attend all scheduled medical appointments: CM offered but patient states she will call and schedule her PCP hospital follow up and has appointment 08/28/23 for staple removal Patient will discuss with PCP if she needs to see pulmonology related to hospital acquired pneumonia with need for O2 at 2 liters   Interventions: Evaluation of current treatment plan related to  self management and patient's adherence to plan as established by provider  Transitions of Care:  Goal Met. Doctor Visits  - discussed the  importance of doctor visits - 09/02/23 follow up appointment completed - staples removed 08/28/23 and patient denied need to continue TOC calls  Management of HTN  (Status:  Goal Not Met.)   Short Term Goal  - 09/02/23 Patient had staples removed 08/28/23 and feels she is doing well and denied need for continued TOC program - unable to complete TOC assessment 09/02/23 - unable to assess goal Evaluation of current treatment plan related to HTN,  Advised patient to take discharge papers to PCP appointment and discuss dose changes with Atenolol and Atacand Provided education to patient re: education provided regarding checking BP with recent dose changes of BP medications Reviewed medications with patient and discussed Patient reported dose change with Atenolol and Atacand and will review with PCP. Patient also reports she has not started her vitamins yet.   Surgery (Post Sigmoid colectomy):  (Status: Goal not metl.) Short Term Goal - 09/02/23 Patient confirmed 08/28/23 appointment completed and denied need to continue TOC calls  reviewed post-operative instructions with patient/caregiver reviewed scheduled provider appointments with patient: Patient has appointment 08/28/23 for staple removal and 10/02/23 with surgeon, Dr Clover Mealy  Patient Goals/Self-Care Activities:09/02/23 Patient had staples removed 08/28/23 and feels she is doing well and denied need for continued TOC program - unable to complete TOC assessment 09/02/23  Participate in Transition of Care Program/Attend [facility] scheduled calls Notify RN Care Manager of [facility] call rescheduling needs Take all medications as prescribed Attend all scheduled provider appointments Call provider office for new concerns or questions and to schedule hospital follow up Monitor incision for signs/symptoms of infection and report to MD immediately Continue Oxygen at 2 liters - call if O2 sat drops below 90% or with shortness of breath - 09/02/23 patient reported she is currently 95% on room air.  Discuss need for pulmonary referral, weaning off oxygen at PCP appointment and take discharge papers to appointment  Follow Up Plan:  09/02/23 Patient had staples  removed 08/28/23 and feels she is doing well and denied need for continued TOC program - unable to complete TOC assessment 09/02/23         Plan: The patient has been provided with contact information for the care management team and has been advised to call with any health related questions or concerns.   Hilbert Odor RN, CCM [place]  VBCI-Population Health RN Care Manager [phone]

## 2023-09-08 ENCOUNTER — Encounter: Payer: Self-pay | Admitting: Family Medicine

## 2023-09-08 ENCOUNTER — Ambulatory Visit (INDEPENDENT_AMBULATORY_CARE_PROVIDER_SITE_OTHER): Admitting: Family Medicine

## 2023-09-08 ENCOUNTER — Ambulatory Visit (INDEPENDENT_AMBULATORY_CARE_PROVIDER_SITE_OTHER)

## 2023-09-08 VITALS — BP 180/62 | HR 75 | Ht 64.0 in | Wt 141.0 lb

## 2023-09-08 DIAGNOSIS — R7301 Impaired fasting glucose: Secondary | ICD-10-CM | POA: Diagnosis not present

## 2023-09-08 DIAGNOSIS — I10 Essential (primary) hypertension: Secondary | ICD-10-CM | POA: Diagnosis not present

## 2023-09-08 DIAGNOSIS — J690 Pneumonitis due to inhalation of food and vomit: Secondary | ICD-10-CM

## 2023-09-08 DIAGNOSIS — N1831 Chronic kidney disease, stage 3a: Secondary | ICD-10-CM

## 2023-09-08 DIAGNOSIS — R490 Dysphonia: Secondary | ICD-10-CM

## 2023-09-08 DIAGNOSIS — R911 Solitary pulmonary nodule: Secondary | ICD-10-CM | POA: Diagnosis not present

## 2023-09-08 DIAGNOSIS — J9811 Atelectasis: Secondary | ICD-10-CM | POA: Diagnosis not present

## 2023-09-08 MED ORDER — AMBULATORY NON FORMULARY MEDICATION: 0 refills | Status: DC

## 2023-09-08 NOTE — Progress Notes (Signed)
 Pt had staples removed on 3/13 from recent surgery, she has a f/u appointment with her surgeon Dr. Ulysees Barns on 4/17.  She does continue to monitor her O2 at home and will use oxygen if she drops below 90%.   So far she has been doing well and her home O2 saturation has been staying around 95%  They increased her Atenolol to 50 mg and the Atacand to 8 mg and was advised to f/u with pcp about this   D/c order for oxygen sent to [facility] [phone] confirmation received.

## 2023-09-08 NOTE — Progress Notes (Signed)
 Established Patient Office Visit  Subjective  Patient ID: Traci [name], female    DOB: [birth year]  Age: 78 y.o. MRN: [id number]  Chief Complaint  Patient presents with   Hospitalization Follow-up    HPI She was admitted to the hospital February 24 and discharged home 11 days later on March 7 for sigmoid diverticulitis status post open sigmoid colectomy on February 24.  Changed anywhere she just surgery heart  Pt had staples removed on 3/13 from recent surgery, she has a f/u appointment with her surgeon Dr. Ulysees Barns on 4/17.  She has been on a soft food diet for 2 weeks and is just transition to more solids in the last 2 days.   Unfortunately after her surgery she aspirated she started vomiting postoperatively and was lying on her back and aspirated.  She does continue to monitor her O2 at home and will use oxygen if she drops below 90%.  She says she has been keeping a close eye on it and has stayed in the 90s without the oxygen so she is the oxygen to be discontinued   So far she has been doing well and her home O2 saturation has been staying around 95%   They increased her Atenolol to 50 mg and the Atacand to 8 mg and was advised to f/u with pcp about this    She still struggling with voice hoarseness after she was intubated for the surgery it has not seemed to resolve she has been doing some salt water gargles she has had a couple days where it felt like it was getting a little better.  She says it almost feels like something stuck in her throat.  She is also been having some night sweats since being home.     ROS    Objective:     BP (!) 180/62   Pulse 75   Ht 5\' 4"  (1.626 m)   Wt 141 lb (64 kg)   SpO2 97%   BMI 24.20 kg/m     Physical Exam Vitals and nursing note reviewed.  Constitutional:      Appearance: Normal appearance.  HENT:     Head: Normocephalic and atraumatic.  Eyes:     Conjunctiva/sclera: Conjunctivae normal.  Cardiovascular:     Rate  and Rhythm: Normal rate and regular rhythm.  Pulmonary:     Effort: Pulmonary effort is normal.     Breath sounds: Normal breath sounds.  Skin:    General: Skin is warm and dry.     Comments: Incision looks great healing well with no drainage.  Steri-Strips are starting to become loose.  Neurological:     Mental Status: She is alert.  Psychiatric:        Mood and Affect: Mood normal.      No results found for any visits on 09/08/23.     The ASCVD Risk score (Arnett DK, et al., 2019) failed to calculate for the following reasons:   Unable to determine if patient is Non-Hispanic African American    Assessment & Plan:   Problem List Items Addressed This Visit       Cardiovascular and Mediastinum   Hypertension (Chronic)   Pressure is elevated and uncontrolled but she says it is better than it was she has been getting 170 at home.  She has been taking an extra Atacand she normally takes 1 twice a day so she has been taking a total of 12 mg.  Again she does feel  that the blood pressure is gradually coming down and she is also been taking the atenolol 2 tabs daily for a total of 50 mg.  He said she felt better taking 2 of the 25's then taking the 50 mg separately.        Respiratory   Right middle lobe pulmonary nodule. 3 mm   Likely need repeat CT in 1 year.        Endocrine   IFG (impaired fasting glucose)   Relevant Orders   CBC with Differential/Platelet   CMP14+EGFR   Hemoglobin A1c   TSH   DG Chest 2 View     Genitourinary   CKD (chronic kidney disease) stage 3, GFR 30-59 ml/min (HCC) - Primary   Relevant Orders   CBC with Differential/Platelet   CMP14+EGFR   Hemoglobin A1c   TSH   DG Chest 2 View   Other Visit Diagnoses       Aspiration pneumonia due to gastric secretions, unspecified laterality, unspecified part of lung (HCC)       Relevant Medications   AMBULATORY NON FORMULARY MEDICATION   Other Relevant Orders   CBC with Differential/Platelet    CMP14+EGFR   Hemoglobin A1c   TSH   DG Chest 2 View     Uncontrolled hypertension       Relevant Orders   CBC with Differential/Platelet   CMP14+EGFR   Hemoglobin A1c   TSH   DG Chest 2 View     Voice hoarseness       Relevant Orders   Ambulatory referral to ENT      Oxygen has been 9899% off of it while ambulating.  Will send an order to [employer] to discontinue her oxygen.  Voice hoarseness-we will go and place ENT referral I would likely be a couple of weeks before she is able to get an appointment and if it resolves on its own by then we can always cancel the appointment.  Aspiration pneumonia-recommend repeat chest x-ray today.  Is anything concerning or residual on the chest x-ray we will get a CT for further workup.  She had bilateral opacification of both lower lobes after aspirating.  Had bilateral small pleural effusions at that time.  Is a 3 mm subpleural right middle lobe pulmonary nodule and possible groundglass opacity.  The Steri-Strips fall off okay to start using either Vaseline twice a day or a scar cream.  No follow-ups on file.    Nani Gasser, MD

## 2023-09-08 NOTE — Assessment & Plan Note (Signed)
 Pressure is elevated and uncontrolled but she says it is better than it was she has been getting 170 at home.  She has been taking an extra Atacand she normally takes 1 twice a day so she has been taking a total of 12 mg.  Again she does feel that the blood pressure is gradually coming down and she is also been taking the atenolol 2 tabs daily for a total of 50 mg.  He said she felt better taking 2 of the 25's then taking the 50 mg separately.

## 2023-09-08 NOTE — Assessment & Plan Note (Signed)
 Likely need repeat CT in 1 year.

## 2023-09-09 ENCOUNTER — Encounter: Payer: Self-pay | Admitting: Family Medicine

## 2023-09-09 DIAGNOSIS — R052 Subacute cough: Secondary | ICD-10-CM

## 2023-09-09 LAB — CMP14+EGFR
ALT: 12 IU/L (ref 0–32)
AST: 23 IU/L (ref 0–40)
Albumin: 4.2 g/dL (ref 3.8–4.8)
Alkaline Phosphatase: 71 IU/L (ref 44–121)
BUN/Creatinine Ratio: 13 (ref 12–28)
BUN: 15 mg/dL (ref 8–27)
Bilirubin Total: 0.3 mg/dL (ref 0.0–1.2)
CO2: 24 mmol/L (ref 20–29)
Calcium: 9.2 mg/dL (ref 8.7–10.3)
Chloride: 102 mmol/L (ref 96–106)
Creatinine, Ser: 1.19 mg/dL — ABNORMAL HIGH (ref 0.57–1.00)
Globulin, Total: 3.5 g/dL (ref 1.5–4.5)
Glucose: 128 mg/dL — ABNORMAL HIGH (ref 70–99)
Potassium: 5.2 mmol/L (ref 3.5–5.2)
Sodium: 140 mmol/L (ref 134–144)
Total Protein: 7.7 g/dL (ref 6.0–8.5)
eGFR: 47 mL/min/{1.73_m2} — ABNORMAL LOW (ref >59)

## 2023-09-09 LAB — CBC WITH DIFFERENTIAL/PLATELET
Basophils Absolute: 0.1 10*3/uL (ref 0.0–0.2)
Basos: 1 %
EOS (ABSOLUTE): 0.4 10*3/uL (ref 0.0–0.4)
Eos: 6 %
Hematocrit: 36.8 % (ref 34.0–46.6)
Hemoglobin: 11.8 g/dL (ref 11.1–15.9)
Immature Grans (Abs): 0 10*3/uL (ref 0.0–0.1)
Immature Granulocytes: 0 %
Lymphocytes Absolute: 2.4 10*3/uL (ref 0.7–3.1)
Lymphs: 36 %
MCH: 28.7 pg (ref 26.6–33.0)
MCHC: 32.1 g/dL (ref 31.5–35.7)
MCV: 90 fL (ref 79–97)
Monocytes Absolute: 0.6 10*3/uL (ref 0.1–0.9)
Monocytes: 9 %
Neutrophils Absolute: 3.1 10*3/uL (ref 1.4–7.0)
Neutrophils: 48 %
Platelets: 489 10*3/uL — ABNORMAL HIGH (ref 150–450)
RBC: 4.11 x10E6/uL (ref 3.77–5.28)
RDW: 12.9 % (ref 11.7–15.4)
WBC: 6.6 10*3/uL (ref 3.4–10.8)

## 2023-09-09 LAB — TSH: TSH: 1.25 u[IU]/mL (ref 0.450–4.500)

## 2023-09-09 LAB — HEMOGLOBIN A1C
Est. average glucose Bld gHb Est-mCnc: 120 mg/dL
Hgb A1c MFr Bld: 5.8 % — ABNORMAL HIGH (ref 4.8–5.6)

## 2023-09-09 NOTE — Progress Notes (Signed)
 Hi [name], blood count looks great but platelets are up a little bit.  That can be from inflammation so not worried about it.  Kidney function was a little elevated at 1.1 normal your baseline is 0.9 we will just keep an eye on that again with everything that went on and recent surgery it is not unusual.  But I would like to recheck your kidney function in about 3 months.  Liver enzymes look great.  A1c looks great at 5.8 even better than last time when it was 6.2.  So great work in bringing that down.  Thyroid function looks perfect.  Would like to get you scheduled for your Medicare wellness exam with our nurse Marylene Land can be done over the phone.  It looks like you had it done in February last year so I would like to try to get you on the schedule in the next month or 2 if at all possible.

## 2023-09-11 ENCOUNTER — Encounter: Payer: Self-pay | Admitting: Family Medicine

## 2023-09-11 ENCOUNTER — Ambulatory Visit (INDEPENDENT_AMBULATORY_CARE_PROVIDER_SITE_OTHER)

## 2023-09-11 VITALS — BP 155/75 | HR 75 | Temp 98.5°F | Ht 64.0 in | Wt 141.0 lb

## 2023-09-11 DIAGNOSIS — R051 Acute cough: Secondary | ICD-10-CM

## 2023-09-11 DIAGNOSIS — R509 Fever, unspecified: Secondary | ICD-10-CM | POA: Diagnosis not present

## 2023-09-11 LAB — POCT INFLUENZA A/B
Influenza A, POC: NEGATIVE
Influenza B, POC: NEGATIVE

## 2023-09-11 LAB — POC COVID19 BINAXNOW: SARS Coronavirus 2 Ag: NEGATIVE

## 2023-09-11 MED ORDER — CEFDINIR 300 MG PO CAPS: 300.0000 mg | ORAL_CAPSULE | Freq: Two times a day (BID) | ORAL | 0 refills | Status: DC

## 2023-09-11 NOTE — Progress Notes (Signed)
 Please call patient and let her know that there is a little bit of atelectasis at the left lung base but no clear sign of pneumonia but since she has been running a fever and going to go ahead and send over an antibiotic for her and she was negative for COVID and flu if she is not feeling better after the weekend then please let us know.

## 2023-09-11 NOTE — Telephone Encounter (Signed)
 Left a vm msg for the patient regarding the provider's response. Direct call back info provided.

## 2023-09-11 NOTE — Progress Notes (Signed)
 Pt here to get tested for covid and flu. Pt has had fever, night sweats, and CP since mon.                         Pt tested for covid and flu

## 2023-09-16 ENCOUNTER — Encounter: Payer: Self-pay | Admitting: Family Medicine

## 2023-09-16 DIAGNOSIS — J029 Acute pharyngitis, unspecified: Secondary | ICD-10-CM

## 2023-09-16 DIAGNOSIS — R509 Fever, unspecified: Secondary | ICD-10-CM

## 2023-09-16 DIAGNOSIS — R490 Dysphonia: Secondary | ICD-10-CM

## 2023-09-16 MED ORDER — CEFDINIR 300 MG PO CAPS: 300.0000 mg | ORAL_CAPSULE | Freq: Two times a day (BID) | ORAL | 0 refills | Status: DC

## 2023-09-17 NOTE — Addendum Note (Signed)
 Addended by: Nani Gasser D on: 09/17/2023 06:45 PM   Modules accepted: Orders

## 2023-09-23 DIAGNOSIS — R509 Fever, unspecified: Secondary | ICD-10-CM | POA: Diagnosis not present

## 2023-09-23 DIAGNOSIS — J029 Acute pharyngitis, unspecified: Secondary | ICD-10-CM | POA: Diagnosis not present

## 2023-09-23 DIAGNOSIS — R490 Dysphonia: Secondary | ICD-10-CM | POA: Diagnosis not present

## 2023-09-23 NOTE — Telephone Encounter (Signed)
 Orders Placed This Encounter  Procedures   Urine Culture   CBC with Differential/Platelet   Urinalysis, Routine w reflex microscopic   Ambulatory referral to ENT    Referral Priority:   Routine    Referral Type:   Consultation    Referral Reason:   Specialty Services Required    Requested Specialty:   Otolaryngology    Number of Visits Requested:   1

## 2023-09-23 NOTE — Addendum Note (Signed)
 Addended by: Nani Gasser D on: 09/23/2023 07:32 AM   Modules accepted: Orders

## 2023-09-24 ENCOUNTER — Encounter: Payer: Self-pay | Admitting: Family Medicine

## 2023-09-24 DIAGNOSIS — R052 Subacute cough: Secondary | ICD-10-CM

## 2023-09-24 DIAGNOSIS — R509 Fever, unspecified: Secondary | ICD-10-CM

## 2023-09-24 DIAGNOSIS — J690 Pneumonitis due to inhalation of food and vomit: Secondary | ICD-10-CM

## 2023-09-24 LAB — CBC WITH DIFFERENTIAL/PLATELET
Basophils Absolute: 0 10*3/uL (ref 0.0–0.2)
Basos: 0 %
EOS (ABSOLUTE): 0.2 10*3/uL (ref 0.0–0.4)
Eos: 1 %
Hematocrit: 34 % (ref 34.0–46.6)
Hemoglobin: 10.8 g/dL — ABNORMAL LOW (ref 11.1–15.9)
Immature Grans (Abs): 0 10*3/uL (ref 0.0–0.1)
Immature Granulocytes: 0 %
Lymphocytes Absolute: 2.4 10*3/uL (ref 0.7–3.1)
Lymphs: 19 %
MCH: 27.9 pg (ref 26.6–33.0)
MCHC: 31.8 g/dL (ref 31.5–35.7)
MCV: 88 fL (ref 79–97)
Monocytes Absolute: 0.8 10*3/uL (ref 0.1–0.9)
Monocytes: 6 %
Neutrophils Absolute: 9.6 10*3/uL — ABNORMAL HIGH (ref 1.4–7.0)
Neutrophils: 74 %
Platelets: 671 10*3/uL — ABNORMAL HIGH (ref 150–450)
RBC: 3.87 x10E6/uL (ref 3.77–5.28)
RDW: 12.8 % (ref 11.7–15.4)
WBC: 13 10*3/uL — ABNORMAL HIGH (ref 3.4–10.8)

## 2023-09-24 LAB — MICROSCOPIC EXAMINATION
Bacteria, UA: NONE SEEN
Casts: NONE SEEN /LPF

## 2023-09-24 LAB — URINALYSIS, ROUTINE W REFLEX MICROSCOPIC
Bilirubin, UA: NEGATIVE
Glucose, UA: NEGATIVE
Ketones, UA: NEGATIVE
Nitrite, UA: NEGATIVE
RBC, UA: NEGATIVE
Specific Gravity, UA: 1.018 (ref 1.005–1.030)
Urobilinogen, Ur: 0.2 mg/dL (ref 0.2–1.0)
pH, UA: 6 (ref 5.0–7.5)

## 2023-09-25 ENCOUNTER — Encounter: Payer: Self-pay | Admitting: Family Medicine

## 2023-09-25 ENCOUNTER — Ambulatory Visit

## 2023-09-25 DIAGNOSIS — J984 Other disorders of lung: Secondary | ICD-10-CM | POA: Diagnosis not present

## 2023-09-25 DIAGNOSIS — R052 Subacute cough: Secondary | ICD-10-CM | POA: Diagnosis not present

## 2023-09-25 DIAGNOSIS — R509 Fever, unspecified: Secondary | ICD-10-CM

## 2023-09-25 DIAGNOSIS — J690 Pneumonitis due to inhalation of food and vomit: Secondary | ICD-10-CM | POA: Diagnosis not present

## 2023-09-25 DIAGNOSIS — R053 Chronic cough: Secondary | ICD-10-CM | POA: Diagnosis not present

## 2023-09-25 DIAGNOSIS — R1013 Epigastric pain: Secondary | ICD-10-CM

## 2023-09-25 DIAGNOSIS — R102 Pelvic and perineal pain: Secondary | ICD-10-CM

## 2023-09-25 LAB — URINE CULTURE

## 2023-09-25 NOTE — Progress Notes (Signed)
 HI Traci [name],  So it does not look like there is any pneumonia or anything on the CAT scan.  Just that little bit of scarring at the bases.  So I do not think the fever and all that is coming from your chest.  I know we also ruled out a urinary tract infection.  Do you feel at all like you could have diverticulitis again are you having belly pain or change in bowels.

## 2023-09-25 NOTE — Progress Notes (Signed)
 Hi [name], urine culture came back negative.

## 2023-09-26 ENCOUNTER — Other Ambulatory Visit: Payer: Self-pay

## 2023-09-26 ENCOUNTER — Ambulatory Visit (HOSPITAL_BASED_OUTPATIENT_CLINIC_OR_DEPARTMENT_OTHER)
Admission: RE | Admit: 2023-09-26 | Discharge: 2023-09-26 | Disposition: A | Discharge status: Home / Self Care | Source: Ambulatory Visit | Attending: Family Medicine | Admitting: Family Medicine

## 2023-09-26 ENCOUNTER — Ambulatory Visit (HOSPITAL_BASED_OUTPATIENT_CLINIC_OR_DEPARTMENT_OTHER)

## 2023-09-26 DIAGNOSIS — R1013 Epigastric pain: Secondary | ICD-10-CM

## 2023-09-26 DIAGNOSIS — Z90710 Acquired absence of both cervix and uterus: Secondary | ICD-10-CM | POA: Diagnosis not present

## 2023-09-26 DIAGNOSIS — R102 Pelvic and perineal pain: Secondary | ICD-10-CM | POA: Insufficient documentation

## 2023-09-26 NOTE — Telephone Encounter (Signed)
 Stat CT ordered

## 2023-09-26 NOTE — Progress Notes (Signed)
 Ordered CT

## 2023-09-29 ENCOUNTER — Encounter: Payer: Self-pay | Admitting: Family Medicine

## 2023-09-29 DIAGNOSIS — R9389 Abnormal findings on diagnostic imaging of other specified body structures: Secondary | ICD-10-CM

## 2023-09-29 NOTE — Telephone Encounter (Signed)
 Please call her surgeon Dr.Mistrot see if they can work her in just let them know that she has been having a persistent fever we ended up doing a scan and it looks like she may have a perirectal infection.  It is urgent she needs to be seen in the next couple days if at all possible.

## 2023-09-29 NOTE — Progress Notes (Signed)
 Hi Traci [name], CT of the abdomen and pelvis show some soft tissue swelling around the rectal area and at the sacrum.  They said it could represent possible early abscess formation versus just nonspecific inflammation or maybe even a mass.  Do you have a gastroenterologist that we could send you this week, to to check this area out?  We could try to get you back in with Dr. Ashley Blades to better evaluate that area?

## 2023-09-29 NOTE — Telephone Encounter (Signed)
 I will check on referral tomorrow.

## 2023-09-29 NOTE — Telephone Encounter (Signed)
 Faxed referral to Dr Mistrot's office. They state they will get her in this week.

## 2023-09-29 NOTE — Telephone Encounter (Signed)
 Will do, thanks

## 2023-09-30 DIAGNOSIS — Z888 Allergy status to other drugs, medicaments and biological substances status: Secondary | ICD-10-CM | POA: Diagnosis not present

## 2023-09-30 DIAGNOSIS — Z881 Allergy status to other antibiotic agents status: Secondary | ICD-10-CM | POA: Diagnosis not present

## 2023-09-30 DIAGNOSIS — Z880 Allergy status to penicillin: Secondary | ICD-10-CM | POA: Diagnosis not present

## 2023-09-30 DIAGNOSIS — I4891 Unspecified atrial fibrillation: Secondary | ICD-10-CM | POA: Diagnosis present

## 2023-09-30 DIAGNOSIS — K219 Gastro-esophageal reflux disease without esophagitis: Secondary | ICD-10-CM | POA: Diagnosis present

## 2023-09-30 DIAGNOSIS — Z8616 Personal history of COVID-19: Secondary | ICD-10-CM | POA: Diagnosis not present

## 2023-09-30 DIAGNOSIS — I4719 Other supraventricular tachycardia: Secondary | ICD-10-CM | POA: Diagnosis present

## 2023-09-30 DIAGNOSIS — K9189 Other postprocedural complications and disorders of digestive system: Secondary | ICD-10-CM | POA: Diagnosis present

## 2023-09-30 DIAGNOSIS — Z9049 Acquired absence of other specified parts of digestive tract: Secondary | ICD-10-CM | POA: Diagnosis not present

## 2023-09-30 DIAGNOSIS — Z91013 Allergy to seafood: Secondary | ICD-10-CM | POA: Diagnosis not present

## 2023-09-30 DIAGNOSIS — E876 Hypokalemia: Secondary | ICD-10-CM | POA: Diagnosis not present

## 2023-09-30 DIAGNOSIS — Z90710 Acquired absence of both cervix and uterus: Secondary | ICD-10-CM | POA: Diagnosis not present

## 2023-09-30 DIAGNOSIS — Z7902 Long term (current) use of antithrombotics/antiplatelets: Secondary | ICD-10-CM | POA: Diagnosis not present

## 2023-09-30 DIAGNOSIS — I10 Essential (primary) hypertension: Secondary | ICD-10-CM | POA: Diagnosis present

## 2023-09-30 DIAGNOSIS — I739 Peripheral vascular disease, unspecified: Secondary | ICD-10-CM | POA: Diagnosis present

## 2023-09-30 DIAGNOSIS — Z7982 Long term (current) use of aspirin: Secondary | ICD-10-CM | POA: Diagnosis not present

## 2023-09-30 DIAGNOSIS — Z882 Allergy status to sulfonamides status: Secondary | ICD-10-CM | POA: Diagnosis not present

## 2023-09-30 DIAGNOSIS — K6289 Other specified diseases of anus and rectum: Secondary | ICD-10-CM | POA: Diagnosis not present

## 2023-09-30 DIAGNOSIS — Z91041 Radiographic dye allergy status: Secondary | ICD-10-CM | POA: Diagnosis not present

## 2023-09-30 DIAGNOSIS — R011 Cardiac murmur, unspecified: Secondary | ICD-10-CM | POA: Diagnosis present

## 2023-09-30 DIAGNOSIS — Z95828 Presence of other vascular implants and grafts: Secondary | ICD-10-CM | POA: Diagnosis not present

## 2023-09-30 DIAGNOSIS — Z79890 Hormone replacement therapy (postmenopausal): Secondary | ICD-10-CM | POA: Diagnosis not present

## 2023-09-30 DIAGNOSIS — Z885 Allergy status to narcotic agent status: Secondary | ICD-10-CM | POA: Diagnosis not present

## 2023-09-30 DIAGNOSIS — K6389 Other specified diseases of intestine: Secondary | ICD-10-CM | POA: Diagnosis not present

## 2023-09-30 NOTE — Telephone Encounter (Signed)
 Left message for the referral department to return my call.

## 2023-10-02 ENCOUNTER — Encounter

## 2023-10-13 ENCOUNTER — Telehealth: Payer: Self-pay

## 2023-10-13 DIAGNOSIS — K9189 Other postprocedural complications and disorders of digestive system: Secondary | ICD-10-CM | POA: Insufficient documentation

## 2023-10-13 NOTE — Patient Instructions (Signed)
 Visit Information  Thank you for taking time to visit with me today. Please don't hesitate to contact me if I can be of assistance to you before our next scheduled telephone appointment.  Our next appointment is by telephone on 10/21/23 at 11am  Following is a copy of your care plan:   Goals Addressed               This Visit's Progress     COMPLETED: TOC Care Plan - Patient will report no readmissions in the next 30 days (pt-stated)        Current Barriers: 09/02/23 Patient had staples removed 08/28/23 and feels she is doing well and denied need for continued TOC program  Medication management: Patient with medication changes and will review need for continued change in dose with PCP  RNCM Clinical Goal(s):  Patient will work with the Care Management team over the next 30 days to address Transition of Care Barriers: Medication Management Patient will take all medications exactly as prescribed and will call provider for medication related questions as evidenced by patient report and health record Patient will attend all scheduled medical appointments: CM offered but patient states she will call and schedule her PCP hospital follow up and has appointment 08/28/23 for staple removal Patient will discuss with PCP if she needs to see pulmonology related to hospital acquired pneumonia with need for O2 at 2 liters   Interventions: Evaluation of current treatment plan related to  self management and patient's adherence to plan as established by provider  Transitions of Care:  Goal Met. Doctor Visits  - discussed the importance of doctor visits - 09/02/23 follow up appointment completed - staples removed 08/28/23 and patient denied need to continue TOC calls  Management of HTN  (Status:  Goal Not Met.)  Short Term Goal  - 09/02/23 Patient had staples removed 08/28/23 and feels she is doing well and denied need for continued TOC program - unable to complete TOC assessment 09/02/23 - unable to assess  goal Evaluation of current treatment plan related to HTN,  Advised patient to take discharge papers to PCP appointment and discuss dose changes with Atenolol  and Atacand  Provided education to patient re: education provided regarding checking BP with recent dose changes of BP medications Reviewed medications with patient and discussed Patient reported dose change with Atenolol  and Atacand  and will review with PCP. Patient also reports she has not started her vitamins yet.   Surgery (Post Sigmoid colectomy):  (Status: Goal not metl.) Short Term Goal - 09/02/23 Patient confirmed 08/28/23 appointment completed and denied need to continue TOC calls  reviewed post-operative instructions with patient/caregiver reviewed scheduled provider appointments with patient: Patient has appointment 08/28/23 for staple removal and 10/02/23 with surgeon, Dr [facility]  Patient Goals/Self-Care Activities:09/02/23 Patient had staples removed 08/28/23 and feels she is doing well and denied need for continued TOC program - unable to complete TOC assessment 09/02/23  Participate in Transition of Care Program/Attend [facility] scheduled calls Notify RN Care Manager of [facility] call rescheduling needs Take all medications as prescribed Attend all scheduled provider appointments Call provider office for new concerns or questions and to schedule hospital follow up Monitor incision for signs/symptoms of infection and report to MD immediately Continue Oxygen  at 2 liters - call if O2 sat drops below 90% or with shortness of breath - 09/02/23 patient reported she is currently 95% on room air.  Discuss need for pulmonary referral, weaning off oxygen  at PCP appointment and take discharge  papers to appointment  Follow Up Plan:  09/02/23 Patient had staples removed 08/28/23 and feels she is doing well and denied need for continued TOC program - unable to complete TOC assessment 09/02/23       VBCI Transitions of Care (TOC) Care Plan         Problems:  Recent Hospitalization for treatment of Anastomotic leak of intestine No Hospital Follow Up Provider appointment [facility] RN offered to schedule, patient prefers to call and states she will call today, 10/13/23   Goal:  Over the next 30 days, the patient will not experience hospital readmission  Interventions:  Transitions of Care: Doctor Visits  - discussed the importance of doctor visits Post discharge activity limitations prescribed by provider reviewed  Patient Self Care Activities:  Attend all scheduled provider appointments Call pharmacy for medication refills 3-7 days in advance of running out of medications Call provider office for new concerns or questions  Notify RN Care Manager of [facility] call rescheduling needs Participate in Transition of Care Program/Attend TOC scheduled calls Take medications as prescribed    Plan:  Telephone follow up appointment with care management team member scheduled for:  10/21/23 11am The patient has been provided with contact information for the care management team and has been advised to call with any health related questions or concerns.         Patient verbalizes understanding of instructions and care plan provided today and agrees to view in MyChart. Active MyChart status and patient understanding of how to access instructions and care plan via MyChart confirmed with patient.     Telephone follow up appointment with care management team member scheduled for:10/21/23 11am The patient has been provided with contact information for the care management team and has been advised to call with any health related questions or concerns.   Please call the care guide team at [phone] if you need to cancel or reschedule your appointment.   Please call the Suicide and Crisis Lifeline: 988 call the [place]  National Suicide Prevention Lifeline: (612)361-3472 or TTY: (873)368-7005 TTY 769-855-6392) to talk to a trained counselor call 911 if you are  experiencing a Mental Health or Behavioral Health Crisis or need someone to talk to.  Tonia Frankel RN, CCM [place]  VBCI-Population Health RN Care Manager [phone]

## 2023-10-13 NOTE — Transitions of Care (Post Inpatient/ED Visit) (Signed)
 10/13/2023  Name: Traci [name] MRN: [id number] DOB: 1945/08/07  Today's TOC FU Call Status: Today's TOC FU Call Status:: Successful TOC FU Call Completed TOC FU Call Complete Date: 10/13/23 Patient's Name and Date of Birth confirmed.  Transition Care Management Follow-up Telephone Call Date of Discharge: 10/11/23 Discharge Facility: Other [facility]) Name of Other (Non[facility]) Discharge Facility: [facility] Type of Discharge: Inpatient Admission Primary Inpatient Discharge Diagnosis:: Anastomotic leak of intestine How have you been since you were released from the hospital?: Better Any questions or concerns?: No (patient denies any questions)  Items Reviewed: Did you receive and understand the discharge instructions provided?: Yes Medications obtained,verified, and reconciled?: Yes (Medications Reviewed) Any new allergies since your discharge?: No Dietary orders reviewed?: Yes Type of Diet Ordered:: GI soft diet Do you have support at home?: Yes Name of Support/Comfort Primary Source: Patient states son lives with her  Medications Reviewed Today: Medications Reviewed Today     Reviewed by Sharmaine Dearth, RN (Registered Nurse) on 10/13/23 at 1117  Med List Status: <None>   Medication Order Taking? Sig Documenting Provider Last Dose Status Informant  AMBULATORY NON FORMULARY MEDICATION [id number] No Medication Name: Please d/c home oxygen .  Patient not taking: Reported on 10/13/2023   Cydney Draft, MD Not Taking Consider Medication Status and Discontinue (No longer needed (for PRN medications))   amoxicillin -clavulanate (AUGMENTIN ) 875-125 MG tablet [id number] Yes Take 1 tablet by mouth 2 (two) times daily. [provider] Taking Active   aspirin  EC 81 MG tablet [id number] Yes Take 81 mg by mouth daily. [provider] Taking Active Self  atenolol  (TENORMIN ) 25 MG tablet [id number] Yes TAKE 1 TABLET DAILY Croitoru, Mihai, MD  Taking Active            Med Note [facility], Landon Truax A   Mon Oct 13, 2023 11:05 AM) Patient reports back to 25mg    atorvastatin  (LIPITOR) 10 MG tablet [id number] Yes TAKE 1 TABLET DAILY AT 6 P.M. Croitoru, Mihai, MD Taking Active   candesartan  (ATACAND ) 4 MG tablet [id number] Yes TAKE 1 TABLET IN THE MORNING AND AT BEDTIME Swinyer, Leilani Punter, NP Taking Active            Med Note [facility], Jeree Delcid A   Mon Oct 13, 2023 11:06 AM) Patient reports back to 4mg   cefdinir  (OMNICEF ) 300 MG capsule [id number] No Take 1 capsule (300 mg total) by mouth 2 (two) times daily.  Patient not taking: Reported on 10/13/2023   Cydney Draft, MD Not Taking Consider Medication Status and Discontinue (No longer needed (for PRN medications))   clopidogrel  (PLAVIX ) 75 MG tablet [id number] Yes TAKE 1 TABLET DAILY Croitoru, Mihai, MD Taking Active   clorazepate  (TRANXENE ) 7.5 MG tablet [id number] Yes TAKE ONE-HALF (1/2) TO ONE TABLET DAILY AS NEEDED Breeback, Jade L, PA-C Taking Active   cyanocobalamin  1000 MCG tablet [id number] Yes Take 1,000 mcg by mouth daily. [provider] Taking Active   EPINEPHrine  (EPIPEN  2-PAK) 0.3 mg/0.3 mL IJ SOAJ injection [id number] Yes Inject 0.3 mLs (0.3 mg total) into the muscle as needed (for allergic reaction). Syliva Even, MD Taking Active Self  esomeprazole  (NEXIUM ) 40 MG capsule [id number] Yes Take 1 capsule (40 mg total) by mouth daily. Take 1 tab daily Croitoru, Mihai, MD Taking Active            Med Note Kathlynn Pardon   Tue May 13, 2023  9:12 AM) Patient takes 3 times week  estradiol  (ESTRACE ) 0.5  MG tablet [id number] Yes TAKE 1 TABLET DAILY Cydney Draft, MD Taking Active   furosemide  (LASIX ) 20 MG tablet [id number] Yes TAKE 1 TABLET EVERY OTHER DAY  Patient taking differently: Take 20 mg by mouth 3 (three) times a week.   Croitoru, Mihai, MD Taking Active   linaclotide [facility]) 145 MCG CAPS capsule [id number] No Take 145 mcg by mouth as needed. constipation   Patient not taking: Reported on 10/13/2023   [provider] Not Taking Consider Medication Status and Discontinue (No longer needed (for PRN medications)) Self           Med Note Henri Loft   Thu May 02, 2022  2:40 PM)    meclizine  (ANTIVERT ) 25 MG tablet [id number] Yes Take 25 mg by mouth daily as needed for dizziness. [provider]  Active   Multiple Vitamins-Minerals (ONE A DAY WOMEN 50 PLUS PO) [id number] Yes Take 1 tablet by mouth daily. [provider] Taking Active   ondansetron  (ZOFRAN ) 4 MG tablet [id number] Yes Take 4 mg by mouth every 6 (six) hours as needed for nausea or vomiting. [provider] Taking Active   VITAMIN D , CHOLECALCIFEROL , PO [id number] Yes Take 1 tablet by mouth daily. [provider] Taking Active   zolpidem  (AMBIEN ) 5 MG tablet [id number] Yes TAKE 1 TABLET AT BEDTIME AS NEEDED FOR SLEEP Breeback, Jade L, PA-C Taking Active             Home Care and Equipment/Supplies: Were Home Health Services Ordered?: No Any new equipment or medical supplies ordered?: No  Functional Questionnaire: Do you need assistance with bathing/showering or dressing?: No Do you need assistance with meal preparation?: No Do you need assistance with eating?: No Do you have difficulty maintaining continence: No Do you need assistance with getting out of bed/getting out of a chair/moving?: No Do you have difficulty managing or taking your medications?: No  Follow up appointments reviewed: PCP Follow-up appointment confirmed?: No (TOC RN offered to schedule-patient declined and said she will call) MD Provider Line [phone] Given: No Specialist Hospital Follow-up appointment confirmed?: Yes Date of Specialist follow-up appointment?: 10/17/23 Follow-Up Specialty Provider:: Andree Bane, MD Do you need transportation to your follow-up appointment?: No Do you understand care options if your condition(s) worsen?:  Yes-patient verbalized understanding  SDOH Interventions Today    Flowsheet Row Most Recent Value  SDOH Interventions   Food Insecurity Interventions Intervention Not Indicated  Housing Interventions Intervention Not Indicated  Transportation Interventions Intervention Not Indicated  Utilities Interventions Intervention Not Indicated       Goals Addressed               This Visit's Progress     COMPLETED: TOC Care Plan - Patient will report no readmissions in the next 30 days (pt-stated)        Current Barriers: 09/02/23 Patient had staples removed 08/28/23 and feels she is doing well and denied need for continued TOC program  Medication management: Patient with medication changes and will review need for continued change in dose with PCP  RNCM Clinical Goal(s):  Patient will work with the Care Management team over the next 30 days to address Transition of Care Barriers: Medication Management Patient will take all medications exactly as prescribed and will call provider for medication related questions as evidenced by patient report and health record Patient will attend all scheduled medical appointments: CM offered but patient states she will call and schedule her PCP hospital follow up  and has appointment 08/28/23 for staple removal Patient will discuss with PCP if she needs to see pulmonology related to hospital acquired pneumonia with need for O2 at 2 liters   Interventions: Evaluation of current treatment plan related to  self management and patient's adherence to plan as established by provider  Transitions of Care:  Goal Met. Doctor Visits  - discussed the importance of doctor visits - 09/02/23 follow up appointment completed - staples removed 08/28/23 and patient denied need to continue TOC calls  Management of HTN  (Status:  Goal Not Met.)  Short Term Goal  - 09/02/23 Patient had staples removed 08/28/23 and feels she is doing well and denied need for continued TOC program -  unable to complete TOC assessment 09/02/23 - unable to assess goal Evaluation of current treatment plan related to HTN,  Advised patient to take discharge papers to PCP appointment and discuss dose changes with Atenolol  and Atacand  Provided education to patient re: education provided regarding checking BP with recent dose changes of BP medications Reviewed medications with patient and discussed Patient reported dose change with Atenolol  and Atacand  and will review with PCP. Patient also reports she has not started her vitamins yet.   Surgery (Post Sigmoid colectomy):  (Status: Goal not metl.) Short Term Goal - 09/02/23 Patient confirmed 08/28/23 appointment completed and denied need to continue TOC calls  reviewed post-operative instructions with patient/caregiver reviewed scheduled provider appointments with patient: Patient has appointment 08/28/23 for staple removal and 10/02/23 with surgeon, Dr [facility]  Patient Goals/Self-Care Activities:09/02/23 Patient had staples removed 08/28/23 and feels she is doing well and denied need for continued TOC program - unable to complete TOC assessment 09/02/23  Participate in Transition of Care Program/Attend [facility] scheduled calls Notify RN Care Manager of [facility] call rescheduling needs Take all medications as prescribed Attend all scheduled provider appointments Call provider office for new concerns or questions and to schedule hospital follow up Monitor incision for signs/symptoms of infection and report to MD immediately Continue Oxygen  at 2 liters - call if O2 sat drops below 90% or with shortness of breath - 09/02/23 patient reported she is currently 95% on room air.  Discuss need for pulmonary referral, weaning off oxygen  at PCP appointment and take discharge papers to appointment  Follow Up Plan:  09/02/23 Patient had staples removed 08/28/23 and feels she is doing well and denied need for continued TOC program - unable to complete TOC assessment 09/02/23        VBC[facility] Transitions of Care (TOC) Care Plan        Problems:  Recent Hospitalization for treatment of Anastomotic leak of intestine No Hospital Follow Up Provider appointment [facility] RN offered to schedule, patient prefers to call and states she will call today, 10/13/23   Goal:  Over the next 30 days, the patient will not experience hospital readmission  Interventions:  Transitions of Care: Doctor Visits  - discussed the importance of doctor visits Post discharge activity limitations prescribed by provider reviewed  Patient Self Care Activities:  Attend all scheduled provider appointments Call pharmacy for medication refills 3-7 days in advance of running out of medications Call provider office for new concerns or questions  Notify RN Care Manager of [facility] call rescheduling needs Participate in Transition of Care Program/Attend TOC scheduled calls Take medications as prescribed    Plan:  Telephone follow up appointment with care management team member scheduled for:  10/21/23 11am The patient has been provided with contact information for  the care management team and has been advised to call with any health related questions or concerns.         Tonia Frankel RN, CCM [place]  VBC[facility]-Population Health RN Care Manager [phone]

## 2023-10-21 ENCOUNTER — Other Ambulatory Visit: Payer: Self-pay

## 2023-10-21 ENCOUNTER — Ambulatory Visit

## 2023-10-21 VITALS — Ht 63.5 in | Wt 137.0 lb

## 2023-10-21 DIAGNOSIS — Z0000 Encounter for general adult medical examination without abnormal findings: Secondary | ICD-10-CM | POA: Diagnosis not present

## 2023-10-21 NOTE — Progress Notes (Signed)
 Subjective:   Traci [name] is a 78 y.o. female who presents for Medicare Annual (Subsequent) preventive examination.  Visit Complete: Virtual I connected with  Traci [name] on 10/21/23 by a audio enabled telemedicine application and verified that I am speaking with the correct person using two identifiers.  Patient Location: Home  Provider Location: Office/Clinic  I discussed the limitations of evaluation and management by telemedicine. The patient expressed understanding and agreed to proceed.  Vital Signs: Because this visit was a virtual/telehealth visit, some criteria may be missing or patient reported. Any vitals not documented were not able to be obtained and vitals that have been documented are patient reported.  Patient Medicare AWV questionnaire was completed by the patient on 10/13/2023; I have confirmed that all information answered by patient is correct and no changes since this date.  Cardiac Risk Factors include: advanced age (>57men, >25 women);smoking/ tobacco exposure;hypertension;dyslipidemia     Objective:    Today's Vitals   10/21/23 1305  Weight: 137 lb (62.1 kg)  Height: 5' 3.5" (1.613 m)   Body mass index is 23.89 kg/m.     10/21/2023    1:12 PM 08/14/2022   10:33 AM 08/09/2022   10:51 AM 07/17/2022    1:50 PM 08/28/2021   10:11 AM 07/20/2021   11:03 AM 02/06/2021    7:04 PM  Advanced Directives  Does Patient Have a Medical Advance Directive? Yes Yes Yes No Yes Yes Yes  Type of Estate agent of [place];Living will Healthcare Power of [place];Living will Living will  Healthcare Power of [place];Living will Healthcare Power of [place];Living will Living will;Healthcare Power of Attorney  Does patient want to make changes to medical advance directive?   No - Patient declined  No - Patient declined No - Patient declined   Copy of Healthcare Power of Attorney in Chart? No - copy requested No - copy requested,  Physician notified   No - copy requested No - copy requested   Would patient like information on creating a medical advance directive?    No - Patient declined   No - Patient declined    Current Medications (verified) Outpatient Encounter Medications as of 10/21/2023  Medication Sig   aspirin  EC 81 MG tablet Take 81 mg by mouth daily.   atenolol  (TENORMIN ) 25 MG tablet TAKE 1 TABLET DAILY   atorvastatin  (LIPITOR) 10 MG tablet TAKE 1 TABLET DAILY AT 6 P.M.   candesartan  (ATACAND ) 4 MG tablet TAKE 1 TABLET IN THE MORNING AND AT BEDTIME   clopidogrel  (PLAVIX ) 75 MG tablet TAKE 1 TABLET DAILY   clorazepate  (TRANXENE ) 7.5 MG tablet TAKE ONE-HALF (1/2) TO ONE TABLET DAILY AS NEEDED   cyanocobalamin  1000 MCG tablet Take 1,000 mcg by mouth daily.   EPINEPHrine  (EPIPEN  2-PAK) 0.3 mg/0.3 mL IJ SOAJ injection Inject 0.3 mLs (0.3 mg total) into the muscle as needed (for allergic reaction).   esomeprazole  (NEXIUM ) 40 MG capsule Take 1 capsule (40 mg total) by mouth daily. Take 1 tab daily   estradiol  (ESTRACE ) 0.5 MG tablet TAKE 1 TABLET DAILY   furosemide  (LASIX ) 20 MG tablet TAKE 1 TABLET EVERY OTHER DAY (Patient taking differently: Take 20 mg by mouth 3 (three) times a week.)   linaclotide (LINZESS) 145 MCG CAPS capsule Take 145 mcg by mouth as needed. constipation   meclizine  (ANTIVERT ) 25 MG tablet Take 25 mg by mouth daily as needed for dizziness.   Multiple Vitamins-Minerals (ONE A DAY WOMEN 50 PLUS PO)  Take 1 tablet by mouth daily.   VITAMIN D , CHOLECALCIFEROL , PO Take 1 tablet by mouth daily.   zolpidem  (AMBIEN ) 5 MG tablet TAKE 1 TABLET AT BEDTIME AS NEEDED FOR SLEEP   AMBULATORY NON FORMULARY MEDICATION Medication Name: Please d/c home oxygen . (Patient not taking: Reported on 10/21/2023)   [DISCONTINUED] cefdinir  (OMNICEF ) 300 MG capsule Take 1 capsule (300 mg total) by mouth 2 (two) times daily. (Patient not taking: Reported on 10/13/2023)   No facility-administered encounter medications on file  as of 10/21/2023.    Allergies (verified) Cortisone, Dilaudid [hydromorphone hcl], Iodine, Medrol [methylprednisolone], Omnipaque [iohexol], Prednisone , Shellfish allergy, Sulfa drugs cross reactors, Z-pak [azithromycin ], Doxycycline , Ivp dye [iodinated contrast media], Levaquin  [levofloxacin ], Methylprednisolone sodium succ, Sulfa antibiotics, Augmentin  [amoxicillin -pot clavulanate], Codeine, Erythromycin, and Morphine  and codeine   History: Past Medical History:  Diagnosis Date   Allergy 1995   Contrast dye   Anxiety    Arthritis 2023   Atrial fibrillation (HCC)    CAD (coronary artery disease)    CKD (chronic kidney disease) stage 3, GFR 30-59 ml/min (HCC) 08/04/2017   Claudication (HCC) 01/06/2008   Lower extremity dopplers - no evidence of arterial insufficiency, normal exam   Coronary artery disease    GERD (gastroesophageal reflux disease)    Heart murmur    Hyperlipidemia    Hypertension 11/30/2010   echo- EF 55%; normal w/ mildly sclerotic aortic valve   Hypertension 11/05/2011   renal dopplers - celiac artery and SMA >50% diameter reduction, R renal artery - mildly elevated velocities 1-59% diameter reduction, L renal artery normal   Nonspecific ST-T wave electrocardiographic changes 03/26/2011   R/Lmv - EF 74%, normal perfusion all regions, ST depression w/ Lexiscan  infusion w/o assoc angina   Osteopenia    PAD (peripheral artery disease) (HCC)    Peripheral vascular disease (HCC)    PVD (peripheral vascular disease) (HCC) 11/05/2011   doppler - R/L brachial pressures essentially equal w/o inflow disease; L sublclavian/CCA bypass graft demonstrates patent flow, no evidence of significant stenosis   Sigmoid diverticulitis    Past Surgical History:  Procedure Laterality Date   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 CFX, RCA OK.   CATARACT EXTRACTION     CHOLECYSTECTOMY     EYE SURGERY     retinal surgery   SMALL INTESTINE SURGERY     SPINE SURGERY  07/2009   SUBCLAVIAN ARTERY STENT  1995   X's  several   SUBCLAVIAN ARTERY STENT Left 09/20/2008   LSA ISR 7x43mm Cordis Genesis on Opta premount, reduction from 80% to 0%   subclavian artery stents  01/23/2010   Left carotid to subclavian artery Bypass   VISCERAL ANGIOGRAM  01/17/2014   Procedure: VISCERAL ANGIOGRAM;  Surgeon: Dannis Dy, MD;  Location: [facility] C[facility];  Service: Cardiovascular;;   Family History  Problem Relation Age of Onset   Heart disease Father 21       Heart Disease before age 39   Kidney disease Father    Heart attack Father  Hyperlipidemia Father    Hypertension Father    Alcohol abuse Father    Kidney failure Mother    Arthritis Mother    Diabetes Maternal Grandmother    Heart disease Paternal Grandfather    Social History   Socioeconomic History   Marital status: Widowed    Spouse name: Not on file   Number of children: 1   Years of education: 41   Highest education level: Associate degree: academic program  Occupational History   Occupation: retired    Comment: Engineer, civil (consulting)  Tobacco Use   Smoking status: Former    Current packs/day: 0.00    Average packs/day: 0.3 packs/day for 20.0 years (5.0 ttl pk-yrs)    Types: Cigarettes    Start date: 09/30/1973    Quit date: 09/30/1993    Years since quitting: 30.0   Smokeless tobacco: Never  Vaping Use   Vaping status: Never Used  Substance and Sexual Activity   Alcohol use: No   Drug use: No   Sexual activity: Not Currently  Other Topics Concern   Not on file  Social  History Narrative   Lives with her son. Likes to go dancing and likes play online games such as scrabble.    Social Drivers of Corporate investment banker Strain: Low Risk  (10/21/2023)   Overall Financial Resource Strain (CARDIA)    Difficulty of Paying Living Expenses: Not hard at all  Food Insecurity: No Food Insecurity (10/21/2023)   Hunger Vital Sign    Worried About Running Out of Food in the Last Year: Never true    Ran Out of Food in the Last Year: Never true  Transportation Needs: No Transportation Needs (10/21/2023)   PRAPARE - Administrator, Civil Service (Medical): No    Lack of Transportation (Non-Medical): No  Physical Activity: Sufficiently Active (10/21/2023)   Exercise Vital Sign    Days of Exercise per Week: 3 days    Minutes of Exercise per Session: 60 min  Recent Concern: Physical Activity - Insufficiently Active (07/31/2023)   Exercise Vital Sign    Days of Exercise per Week: 2 days    Minutes of Exercise per Session: 60 min  Stress: No Stress Concern Present (10/21/2023)   [employer] of Occupational Health - Occupational Stress Questionnaire    Feeling of Stress : Not at all  Social Connections: Moderately Integrated (10/21/2023)   Social Connection and Isolation Panel [NHANES]    Frequency of Communication with Friends and Family: Three times a week    Frequency of Social Gatherings with Friends and Family: Twice a week    Attends Religious Services: More than 4 times per year    Active Member of [employer] or Organizations: Yes    Attends Banker Meetings: More than 4 times per year    Marital Status: Widowed    Tobacco Counseling Counseling given: Not Answered   Clinical Intake:  Pre-visit preparation completed: Yes  Pain : No/denies pain     BMI - recorded: 23.89 Nutritional Status: BMI of 19-24  Normal Nutritional Risks: None Diabetes: No  How often do you need to have someone help you when you read instructions,  pamphlets, or other written materials from your doctor or pharmacy?: 1 - Never What is the last grade level you completed in school?: 16  Interpreter Needed?: No      Activities of Daily Living    10/21/2023    1:06 PM 10/17/2023    8:56  AM  In your present state of health, do you have any difficulty performing the following activities:  Hearing? 0 0  Vision? 0 0  Difficulty concentrating or making decisions? 0 0  Walking or climbing stairs? 0 0  Dressing or bathing? 0 0  Doing errands, shopping? 0 0  Preparing Food and eating ? N N  Using the Toilet? N N  In the past six months, have you accidently leaked urine? N N  Do you have problems with loss of bowel control? N N  Managing your Medications? N N  Managing your Finances? N N  Housekeeping or managing your Housekeeping? N N    Patient Care Team: Cydney Draft, MD as PCP - General (Family Medicine) Croitoru, Karyl Paget, MD as PCP - Cardiology (Cardiology) Roosevelt Coles, MD (Gastroenterology) Mistrot, Arleen Lacer, MD as Referring Physician (Colon and Rectal Surgery) Sharmaine Dearth, RN as Registered Nurse Kayla Part, MD as Consulting Physician (Vascular Surgery)  Indicate any recent Medical Services you may have received from other than [facility] providers in the past year (date may be approximate).     Assessment:   This is a routine wellness examination for Maegen.  Hearing/Vision screen No results found.   Goals Addressed             This Visit's Progress    Patient Stated       Patient would like to have a healthy lifestyle.       Depression Screen    10/21/2023    1:10 PM 08/29/2022   11:28 AM 08/09/2022   10:51 AM 02/28/2022   10:40 AM 08/28/2021   10:11 AM 07/20/2021   11:05 AM 07/02/2021    1:34 PM  PHQ 2/9 Scores  PHQ - 2 Score 0 4 0 0 1 0 0  PHQ- 9 Score  8         Fall Risk    10/21/2023    1:12 PM 10/17/2023    8:56 AM 09/28/2023    9:54 AM 07/31/2023    8:45 AM 01/20/2023   10:54  AM  Fall Risk   Falls in the past year? 0 1 1 0 1  Number falls in past yr: 0 0 0 0 0  Injury with Fall? 0 0 0 0 1  Risk for fall due to : No Fall Risks    No Fall Risks  Follow up Falls evaluation completed    Falls evaluation completed    MEDICARE RISK AT HOME: Medicare Risk at Home Any stairs in or around the home?: No If so, are there any without handrails?: No Home free of loose throw rugs in walkways, pet beds, electrical cords, etc?: Yes Adequate lighting in your home to reduce risk of falls?: Yes Life alert?: No Use of a cane, walker or w/c?: No Grab bars in the bathroom?: Yes Shower chair or bench in shower?: No Elevated toilet seat or a handicapped toilet?: No  TIMED UP AND GO:  Was the test performed?  No    Cognitive Function:        10/21/2023    1:13 PM 08/09/2022   11:03 AM 07/20/2021   11:10 AM 06/21/2020   11:08 AM 06/21/2019   11:07 AM  6CIT Screen  What Year? 0 points 0 points 0 points 0 points 0 points  What month? 0 points 0 points 0 points 0 points 0 points  What time? 0 points 0 points 0 points 0 points 0  points  Count back from 20 0 points 0 points 0 points 0 points 0 points  Months in reverse 0 points 0 points 0 points 0 points 0 points  Repeat phrase 0 points 0 points 0 points 0 points 0 points  Total Score 0 points 0 points 0 points 0 points 0 points    Immunizations Immunization History  Administered Date(s) Administered   Fluad Quad(high Dose 65+) 03/01/2019, 02/28/2020, 02/28/2021   Fluad Trivalent(High Dose 65+) 03/03/2023   Influenza, High Dose Seasonal PF 03/25/2018   Influenza, Seasonal, Injecte, Preservative Fre 03/01/2019   Influenza-Unspecified 03/02/2015, 03/15/2016, 02/21/2017, 02/07/2022   Lyme Disease 03/20/2009   PFIZER(Purple Top)SARS-COV-2 Vaccination 07/22/2019, 08/16/2019, 03/15/2020, 12/30/2020   Pfizer Covid-19 Vaccine Bivalent Booster 23yrs & up 04/01/2022   Pfizer(Comirnaty)Fall Seasonal Vaccine 12 years and older  04/01/2022   Pneumococcal Conjugate-13 08/30/2014   Pneumococcal Polysaccharide-23 10/10/2011, 06/17/2012, 01/20/2023   Tdap 06/17/2010, 04/08/2011, 03/23/2021   Zoster Recombinant(Shingrix) 03/23/2021, 05/24/2021   Zoster, Live 03/22/2012, 06/17/2012    TDAP status: Up to date  Flu Vaccine status: Up to date  Pneumococcal vaccine status: Up to date  Covid-19 vaccine status: Declined, Education has been provided regarding the importance of this vaccine but patient still declined. Advised may receive this vaccine at local pharmacy or Health Dept.or vaccine clinic. Aware to provide a copy of the vaccination record if obtained from local pharmacy or Health Dept. Verbalized acceptance and understanding.  Qualifies for Shingles Vaccine? Yes   Zostavax completed Yes   Shingrix Completed?: Yes  Screening Tests Health Maintenance  Topic Date Due   COVID-19 Vaccine (6 - 2024-25 season) 02/16/2023   INFLUENZA VACCINE  01/16/2024   DEXA SCAN  05/08/2024   Medicare Annual Wellness (AWV)  10/20/2024   MAMMOGRAM  03/04/2025   DTaP/Tdap/Td (4 - Td or Tdap) 03/24/2031   Pneumonia Vaccine 45+ Years old  Completed   Hepatitis C Screening  Completed   Zoster Vaccines- Shingrix  Completed   HPV VACCINES  Aged Out   Meningococcal B Vaccine  Aged Out   Colonoscopy  Discontinued    Health Maintenance  Health Maintenance Due  Topic Date Due   COVID-19 Vaccine (6 - 2024-25 season) 02/16/2023    Colorectal cancer screening: Type of screening: Colonoscopy. Completed 07/17/2015. Repeat every 10 years  Mammogram status: Completed 03/05/2023. Repeat every year  Bone Density status: Completed 05/08/2022. Results reflect: Bone density results: OSTEOPOROSIS. Repeat every 2 years.  Lung Cancer Screening: (Low Dose CT Chest recommended if Age 48-80 years, 20 pack-year currently smoking OR have quit w/in 15years.) does not qualify.   Lung Cancer Screening Referral: n/a  Additional  Screening:  Hepatitis C Screening: does qualify; Completed 11/07/2015  Vision Screening: Recommended annual ophthalmology exams for early detection of glaucoma and other disorders of the eye. Is the patient up to date with their annual eye exam?  Yes  Who is the provider or what is the name of the office in which the patient attends annual eye exams? Dr Elnita Hai If pt is not established with a provider, would they like to be referred to a provider to establish care?  N/a .   Dental Screening: Recommended annual dental exams for proper oral hygiene   Community Resource Referral / Chronic Care Management: CRR required this visit?  No   CCM required this visit?  No     Plan:     I have personally reviewed and noted the following in the patient's chart:   Medical and  social history Use of alcohol, tobacco or illicit drugs  Current medications and supplements including opioid prescriptions. Patient is not currently taking opioid prescriptions. Functional ability and status Nutritional status Physical activity Advanced directives List of other physicians Hospitalizations # 2, surgeries # 1, and ER # 1 visits in previous 12 months Vitals Screenings to include cognitive, depression, and falls Referrals and appointments  In addition, I have reviewed and discussed with patient certain preventive protocols, quality metrics, and best practice recommendations. A written personalized care plan for preventive services as well as general preventive health recommendations were provided to patient.     Aubrey Leaf, CMA   10/21/2023   After Visit Summary: (MyChart) Due to this being a telephonic visit, the after visit summary with patients personalized plan was offered to patient via MyChart   Nurse Notes:    VERLETTA MATESIC is a 78 y.o. female patient of Metheney, Corita Diego, MD who had a Medicare Annual Wellness Visit today via telephone. Eimy is Retired and her son  lives with her. She has one child. she reports that she is socially active and does interact with friends/family regularly. She is minimally physically active and enjoys dancing, meeting with friends and playing online games.

## 2023-10-21 NOTE — Patient Instructions (Signed)
 Traci [name] , Thank you for taking time to come for your Medicare Wellness Visit. I appreciate your ongoing commitment to your health goals. Please review the following plan we discussed and let me know if I can assist you in the future.   These are the goals we discussed:  Goals       patient appreciative of call from LCSW. She will think about program support . Not sure if she wants to participate in program at present      Intervention Called client today and spoke with her about program support. Described support in program with LCSW, RN and Pharmacist. Encouraged client to talk with PCP about program support  Client will think about program support.  She will talk with PCP if she is interested in participation in program. She did not request return call at present      Patient Stated      Patient states wants to be able to go out and travel again.       Patient Stated      06/21/2020 AWV Goal: Exercise for General Health  Patient will verbalize understanding of the benefits of increased physical activity: Exercising regularly is important. It will improve your overall fitness, flexibility, and endurance. Regular exercise also will improve your overall health. It can help you control your weight, reduce stress, and improve your bone density. Over the next year, patient will increase physical activity as tolerated with a goal of at least 150 minutes of moderate physical activity per week.  You can tell that you are exercising at a moderate intensity if your heart starts beating faster and you start breathing faster but can still hold a conversation. Moderate-intensity exercise ideas include: Walking 1 mile (1.6 km) in about 15 minutes Biking Hiking Golfing Dancing Water aerobics Patient will verbalize understanding of everyday activities that increase physical activity by providing examples like the following: Yard work, such as: Therapist, occupational Gardening Washing windows or floors Patient will be able to explain general safety guidelines for exercising:  Before you start a new exercise program, talk with your health care provider. Do not exercise so much that you hurt yourself, feel dizzy, or get very short of breath. Wear comfortable clothes and wear shoes with good support. Drink plenty of water while you exercise to prevent dehydration or heat stroke. Work out until your breathing and your heartbeat get faster.       Patient Stated (pt-stated)      Patient states she would like to start travelling again.      Patient Stated (pt-stated)      Patient would like to be able to return to her baseline.       Patient Stated      Patient would like to have a healthy lifestyle.       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 and  denies need for any additional calls related to follow up with surgeon every 2 weeks and has PCP appointment 10/23/23) - unable to mark goals met  Over the next 30 days, the patient will not experience hospital readmission  Interventions:  Transitions of Care: Doctor Visits  - discussed the importance of doctor visits Post discharge activity limitations prescribed by provider reviewed  Patient Self Care Activities:  Attend all scheduled provider appointments Call pharmacy for medication refills 3-7 days in advance of running out of medications Call  provider office for new concerns or questions  Notify RN Care Manager of TOC call rescheduling needs Participate in Transition of Care Program/Attend TOC scheduled calls Take medications as prescribed - patient declined medication review and states she doesn't feel she needs the calls because she is seeing Mds regularly - Unable to complete assessment - closing TOC program per patient request  Plan:  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         This is a list of the screening recommended for you and due dates:  Health Maintenance  Topic Date Due   COVID-19 Vaccine (6 - 2024-25 season) 02/16/2023   Flu Shot  01/16/2024   DEXA scan (bone density measurement)  05/08/2024   Medicare Annual Wellness Visit  10/20/2024   Mammogram  03/04/2025   DTaP/Tdap/Td vaccine (4 - Td or Tdap) 03/24/2031   Pneumonia Vaccine  Completed   Hepatitis C Screening  Completed   Zoster (Shingles) Vaccine  Completed   HPV Vaccine  Aged Out   Meningitis B Vaccine  Aged Out   Colon Cancer Screening  Discontinued

## 2023-10-21 NOTE — Transitions of Care (Post Inpatient/ED Visit) (Signed)
  Transition of Care week 2  Visit Note  10/21/2023  Name: Traci [name] MRN: [id number]          DOB: 03/24/1946  Situation: Patient enrolled in [facility] 30-day program. Visit completed with patient by telephone.   Background: Patient enrolled in [facility] program and had appointment with her surgeon and will follow up again in 2 weeks and will see her PCP 10/23/23. Patient denied need to review medications or continue with calls. Pt worked as Char[employer] fundraiser and states she is seeing Mds regularly. TOC program is being closed per patient request  Initial Transition Care Management Follow-up Telephone Call    Past Medical History:  Diagnosis Date   Allergy 1995   Contrast dye   Anxiety    Arthritis 2023   Atrial fibrillation (HCC)    CAD (coronary artery disease)    CKD (chronic kidney disease) stage 3, GFR 30-59 ml/min (HCC) 08/04/2017   Claudication (HCC) 01/06/2008   Lower extremity dopplers - no evidence of arterial insufficiency, normal exam   Coronary artery disease    GERD (gastroesophageal reflux disease)    Heart murmur    Hyperlipidemia    Hypertension 11/30/2010   echo- EF 55%; normal w/ mildly sclerotic aortic valve   Hypertension 11/05/2011   renal dopplers - celiac artery and SMA >50% diameter reduction, R renal artery - mildly elevated velocities 1-59% diameter reduction, L renal artery normal   Nonspecific ST-T wave electrocardiographic changes 03/26/2011   R/Lmv - EF 74%, normal perfusion all regions, ST depression w/ Lexiscan  infusion w/o assoc angina   Osteopenia    PAD (peripheral artery disease) (HCC)    Peripheral vascular disease (HCC)    PVD (peripheral vascular disease) (HCC) 11/05/2011   doppler - R/L brachial pressures essentially equal w/o inflow disease; L sublclavian/CCA bypass graft demonstrates patent flow, no evidence of significant stenosis   Sigmoid diverticulitis     Assessment: Patient Reported Symptoms:Patient declined - unable to complete assessments  or review medications  There were no vitals filed for this visit.  Medications Reviewed Today   Medications were not reviewed in this encounter     Recommendation:   Continue as planned with PCP Follow-up and surgeon follow up  Follow Up Plan:   Closing From:  Transitions of Care Program  Tonia Frankel RN, CCM [facility]  VBCI-Population Health RN Care Manager [phone]

## 2023-10-23 ENCOUNTER — Ambulatory Visit (INDEPENDENT_AMBULATORY_CARE_PROVIDER_SITE_OTHER): Admitting: Family Medicine

## 2023-10-23 ENCOUNTER — Encounter: Payer: Self-pay | Admitting: Family Medicine

## 2023-10-23 VITALS — BP 130/64 | HR 69 | Ht 64.0 in | Wt 137.0 lb

## 2023-10-23 DIAGNOSIS — I10 Essential (primary) hypertension: Secondary | ICD-10-CM

## 2023-10-23 DIAGNOSIS — K5732 Diverticulitis of large intestine without perforation or abscess without bleeding: Secondary | ICD-10-CM

## 2023-10-23 NOTE — Assessment & Plan Note (Signed)
 Has had some lower blood pressures since being home.  So we did discuss that if it is low she can skip her blood pressure pill that day and then recheck it the next day.  It may go back up if she is eating more normally.

## 2023-10-23 NOTE — Progress Notes (Signed)
 Established Patient Office Visit  Subjective  Patient ID: Traci [name], female    DOB: [birth year]  Age: 78 y.o. MRN: [id number]  Chief Complaint  Patient presents with   Follow-up      HPI She is here today for hospital follow-up from [facility] health.  She was admitted on April 15 and discharged home 11 days later on April 26.  She had a knot anastomotic leak of her intestine after recent surgery.  She was discharged home on 6 additional days of Augmentin .  She has been seeing GI every 2 weeks and she is still not on solid foods.  Hospital Course:  Patient originally underwent sigmoid colectomy for diverticulitis on 08/11/23 by Dr. Ashley Blades. She had a prolonged hospitalization after she developed acute respiratory failure with hypoxia d/t hospital acquired pneumonia. She was discharged home on oxygen  and was able to wean off it. Shortly after, she developed fevers, chills, sweats thought to be due to her pneumonia. She then developed abdominal pain, outpatient CT concerning for abscess and was recommended admission. She was admitted and repeat CT scan demonstrated likely small chronic anastomotic leak. IR was consulted but this was not a drainable abscess, she was placed on IV antibiotics. She also required PPN due to her low po intake and need for bowel rest. Repeat CT scan 5 days later showed significant improvement, with no air or obvious abscess. She was started back on clear liquids and she was able to tolerate advancement of her diet. Her pain is now mild and controlled and she feels comfortable with discharge home. She will continue po antibiotics for 1 week and will follow up with Dr. Ashley Blades in office at that time.   She is doing well overall. She is not back to her normal diet yet but hopefully on Sunday will start progressing they want her to eat a high-protein low fiber diet.  She has been using MiraLAX  to keep her bowels moving.  She still having some stool urgency so she  has been wearing some underpants for that.  But it has not been too bothersome at this point.  She has lost a lot of weight since she was in the hospital she is try to drink 2 boosts a day and try to eat 5 small amounts during the daytime as well.  Still having a little soreness in the upper epigastric area.     ROS    Objective:     BP 130/64   Pulse 69   Ht 5\' 4"  (1.626 m)   Wt 137 lb (62.1 kg)   SpO2 99%   BMI 23.52 kg/m    Physical Exam Vitals and nursing note reviewed.  Constitutional:      Appearance: Normal appearance.  HENT:     Head: Normocephalic and atraumatic.  Eyes:     Conjunctiva/sclera: Conjunctivae normal.  Cardiovascular:     Rate and Rhythm: Normal rate and regular rhythm.  Pulmonary:     Effort: Pulmonary effort is normal.     Breath sounds: Normal breath sounds.  Abdominal:     Tenderness: There is abdominal tenderness.     Comments: Epigastric tenderness.  Nontender elsewhere.  Skin:    General: Skin is warm and dry.  Neurological:     Mental Status: She is alert.  Psychiatric:        Mood and Affect: Mood normal.      No results found for any visits on 10/23/23.    The ASCVD  Risk score (Arnett DK, et al., 2019) failed to calculate for the following reasons:   Unable to determine if patient is Non-Hispanic African American    Assessment & Plan:   Problem List Items Addressed This Visit       Cardiovascular and Mediastinum   Hypertension (Chronic)   Has had some lower blood pressures since being home.  So we did discuss that if it is low she can skip her blood pressure pill that day and then recheck it the next day.  It may go back up if she is eating more normally.      Other Visit Diagnoses       Sigmoid diverticulitis    -  Primary      She is significantly better but still has not gained her weight back but she is trying to get a couple boost send she is a try to eat high-protein she will try to advance her diet this  Sunday.  It sounds like she is keeping her bowels moving with MiraLAX  which I think is great.  Still a little bit of tenderness in the epigastric area but overall getting a lot better she is following with the rectal surgeon every 2 weeks.  We discussed moving her June appointment out to September since she is doing well otherwise.  Return in 5 months (on 03/08/2024) for sleep medicine and 6 mo check up .    Duaine German, MD

## 2023-11-20 ENCOUNTER — Ambulatory Visit: Payer: Medicare Other | Admitting: Family Medicine

## 2023-11-20 ENCOUNTER — Other Ambulatory Visit: Payer: Self-pay | Admitting: Family Medicine

## 2023-11-20 DIAGNOSIS — G4700 Insomnia, unspecified: Secondary | ICD-10-CM

## 2023-11-21 NOTE — Telephone Encounter (Signed)
 PMP AWARE CHECKED  Clorazepate  7.5 mg #85 written 3/14 filled on 3/24  Zolpidem  5 mg #90 written 3/14 filled on 3/21  Last OV 10/23/2023  Next OV 02/23/2024

## 2023-11-26 DIAGNOSIS — H31091 Other chorioretinal scars, right eye: Secondary | ICD-10-CM | POA: Diagnosis not present

## 2023-11-26 DIAGNOSIS — H3581 Retinal edema: Secondary | ICD-10-CM | POA: Diagnosis not present

## 2023-11-26 DIAGNOSIS — H18233 Secondary corneal edema, bilateral: Secondary | ICD-10-CM | POA: Diagnosis not present

## 2023-11-26 DIAGNOSIS — H43812 Vitreous degeneration, left eye: Secondary | ICD-10-CM | POA: Diagnosis not present

## 2023-11-26 DIAGNOSIS — H35372 Puckering of macula, left eye: Secondary | ICD-10-CM | POA: Diagnosis not present

## 2023-11-27 ENCOUNTER — Ambulatory Visit: Payer: Medicare Other | Admitting: Family Medicine

## 2023-12-17 DIAGNOSIS — K5732 Diverticulitis of large intestine without perforation or abscess without bleeding: Secondary | ICD-10-CM | POA: Diagnosis not present

## 2023-12-17 DIAGNOSIS — R1084 Generalized abdominal pain: Secondary | ICD-10-CM | POA: Diagnosis not present

## 2023-12-23 DIAGNOSIS — Z9049 Acquired absence of other specified parts of digestive tract: Secondary | ICD-10-CM | POA: Diagnosis not present

## 2023-12-23 DIAGNOSIS — R109 Unspecified abdominal pain: Secondary | ICD-10-CM | POA: Diagnosis not present

## 2023-12-23 DIAGNOSIS — K5732 Diverticulitis of large intestine without perforation or abscess without bleeding: Secondary | ICD-10-CM | POA: Diagnosis not present

## 2023-12-23 DIAGNOSIS — Z90710 Acquired absence of both cervix and uterus: Secondary | ICD-10-CM | POA: Diagnosis not present

## 2024-01-07 ENCOUNTER — Other Ambulatory Visit: Payer: Self-pay | Admitting: Cardiovascular Disease

## 2024-01-07 DIAGNOSIS — H16121 Filamentary keratitis, right eye: Secondary | ICD-10-CM | POA: Diagnosis not present

## 2024-01-20 ENCOUNTER — Other Ambulatory Visit: Payer: Self-pay | Admitting: Family Medicine

## 2024-01-20 DIAGNOSIS — Z1231 Encounter for screening mammogram for malignant neoplasm of breast: Secondary | ICD-10-CM

## 2024-02-03 DIAGNOSIS — R140 Abdominal distension (gaseous): Secondary | ICD-10-CM | POA: Diagnosis not present

## 2024-02-03 DIAGNOSIS — K5904 Chronic idiopathic constipation: Secondary | ICD-10-CM | POA: Diagnosis not present

## 2024-02-17 ENCOUNTER — Encounter: Payer: Self-pay | Admitting: Sports Medicine

## 2024-02-18 ENCOUNTER — Other Ambulatory Visit: Payer: Self-pay | Admitting: Family Medicine

## 2024-02-18 DIAGNOSIS — G4700 Insomnia, unspecified: Secondary | ICD-10-CM

## 2024-02-23 ENCOUNTER — Ambulatory Visit (INDEPENDENT_AMBULATORY_CARE_PROVIDER_SITE_OTHER): Admitting: Family Medicine

## 2024-02-23 ENCOUNTER — Encounter: Payer: Self-pay | Admitting: Family Medicine

## 2024-02-23 VITALS — BP 132/60 | HR 51 | Ht 64.0 in | Wt 142.5 lb

## 2024-02-23 DIAGNOSIS — R7301 Impaired fasting glucose: Secondary | ICD-10-CM

## 2024-02-23 DIAGNOSIS — G4700 Insomnia, unspecified: Secondary | ICD-10-CM

## 2024-02-23 DIAGNOSIS — K589 Irritable bowel syndrome without diarrhea: Secondary | ICD-10-CM

## 2024-02-23 DIAGNOSIS — M25512 Pain in left shoulder: Secondary | ICD-10-CM | POA: Diagnosis not present

## 2024-02-23 DIAGNOSIS — Z23 Encounter for immunization: Secondary | ICD-10-CM | POA: Diagnosis not present

## 2024-02-23 DIAGNOSIS — I10 Essential (primary) hypertension: Secondary | ICD-10-CM

## 2024-02-23 LAB — POCT GLYCOSYLATED HEMOGLOBIN (HGB A1C): Hemoglobin A1C: 5.9 % — AB (ref 4.0–5.6)

## 2024-02-23 NOTE — Assessment & Plan Note (Addendum)
 Check A1c today.  He really does not eat a lot at 1 meal otherwise she gets a lot of abdominal distention discomfort and bloating.

## 2024-02-23 NOTE — Progress Notes (Signed)
 Established Patient Office Visit  Subjective  Patient ID: Traci [name], female    DOB: 25-Jul-1945  Age: 78 y.o. MRN: [id number]  Chief Complaint  Patient presents with   Insomnia         HPI  Recently saw GI for her chronic constipation at [facility] in August.  She is back on a low-dose of Linzess as needed and right now that actually seems to be working pretty well for her  Occasionally she will notice a bulge in that left upper abdomen she has had it checked out and they said it was not a hernia and they felt like it was probably more scar tissue than anything else.  More recently she decided to try melatonin 5 mg and says it actually was helpful so her plan long-term is to start weaning down her sleep medication and maybe adding in some melatonin here and there so that she can use less of it.  She has been having some pain in her left shoulder.  She has not lost any range of motion and it started a week or so ago she denies any known injury or trauma.  The pain goes anterior as well as posterior and then down her arm sometimes even all the way down to her elbow.    ROS    Objective:     BP 132/60   Pulse (!) 51   Ht 5' 4 (1.626 m)   Wt 142 lb 8 oz (64.6 kg)   SpO2 100%   BMI 24.46 kg/m    Physical Exam Vitals and nursing note reviewed.  Constitutional:      Appearance: Normal appearance.  HENT:     Head: Normocephalic and atraumatic.  Eyes:     Conjunctiva/sclera: Conjunctivae normal.  Cardiovascular:     Rate and Rhythm: Normal rate and regular rhythm.  Pulmonary:     Effort: Pulmonary effort is normal.     Breath sounds: Normal breath sounds.  Skin:    General: Skin is warm and dry.  Neurological:     Mental Status: She is alert.  Psychiatric:        Mood and Affect: Mood normal.      Results for orders placed or performed in visit on 02/23/24  POCT HgB A1C  Result Value Ref Range   Hemoglobin A1C 5.9 (A) 4.0 - 5.6 %   HbA1c POC (<>  result, manual entry)     HbA1c, POC (prediabetic range)     HbA1c, POC (controlled diabetic range)        The ASCVD Risk score (Arnett DK, et al., 2019) failed to calculate for the following reasons:   Unable to determine if patient is Non-Hispanic African American    Assessment & Plan:   Problem List Items Addressed This Visit       Cardiovascular and Mediastinum   Hypertension (Chronic)   Initial blood pressure elevated will recheck before going home today.  Currently on candesartan  4 mg.        Digestive   Irritable bowel syndrome   Right now doing really well with as needed use of low-dose Linzess.  The higher dose caused recurrent diarrhea.      Relevant Medications   linaclotide (LINZESS) 72 MCG capsule     Endocrine   IFG (impaired fasting glucose)   Check A1c today.  He really does not eat a lot at 1 meal otherwise she gets a lot of abdominal distention discomfort and bloating.  Relevant Orders   POCT HgB A1C (Completed)     Other   Insomnia - Primary   To go ahead and refill the zolpidem  today but just encouraged her to use as needed she is gena try to start transitioning to some melatonin here and there.      Other Visit Diagnoses       Encounter for immunization       Relevant Orders   Flu vaccine HIGH DOSE PF(Fluzone Trivalent) (Completed)     Acute pain of left shoulder          Left shoulder pain  - Has NROM with neg empty can test.  Tend over the front and back of shoulder.  Likley bursitis. Given H.O with exercises.   Return in about 6 months (around 08/22/2024).    [name], MD

## 2024-02-23 NOTE — Assessment & Plan Note (Signed)
 To go ahead and refill the zolpidem  today but just encouraged her to use as needed she is gena try to start transitioning to some melatonin here and there.

## 2024-02-23 NOTE — Telephone Encounter (Signed)
 ZOLPIDEM  & CLORAZEPATE  Last OV: 10/23/23 Next OV: No appointment scheduled Last RF: 12/05/23

## 2024-02-23 NOTE — Assessment & Plan Note (Signed)
 Initial blood pressure elevated will recheck before going home today.  Currently on candesartan  4 mg.

## 2024-02-23 NOTE — Assessment & Plan Note (Signed)
 Right now doing really well with as needed use of low-dose Linzess.  The higher dose caused recurrent diarrhea.

## 2024-03-15 ENCOUNTER — Ambulatory Visit

## 2024-03-16 DIAGNOSIS — K5904 Chronic idiopathic constipation: Secondary | ICD-10-CM | POA: Diagnosis not present

## 2024-03-16 DIAGNOSIS — R140 Abdominal distension (gaseous): Secondary | ICD-10-CM | POA: Diagnosis not present

## 2024-03-19 ENCOUNTER — Ambulatory Visit: Admission: EM | Admit: 2024-03-19 | Discharge: 2024-03-19 | Disposition: A | Discharge status: Home / Self Care

## 2024-03-19 DIAGNOSIS — T148XXA Other injury of unspecified body region, initial encounter: Secondary | ICD-10-CM

## 2024-03-19 NOTE — ED Triage Notes (Addendum)
 Pt presents to uc with co bleeding coming from her left ear and hearing loss in her right since last night Pt reports no pain.

## 2024-03-19 NOTE — Discharge Instructions (Addendum)
 Advised patient if bleeding of bilateral ear continues worsens please go to [facility] ED immediately for further evaluation.

## 2024-03-19 NOTE — ED Notes (Signed)
 During ear lavage patient became dizzy. Assisted patient to lay down and stopped procedure. Informed NP

## 2024-03-19 NOTE — ED Provider Notes (Signed)
 Traci [name]    CSN: [id number] Arrival date & time: 03/19/24  1614      History   Chief Complaint No chief complaint on file.   HPI Traci [name] is a 78 y.o. female.   HPI Pleasant 78 year old female presents with bleeding of ear canals and loss of right ear hearing since last night.  Patient [place].  Patient is currently on Plavix .  PMH significant for A-fib, CAD and CKD.  Past Medical History:  Diagnosis Date   Allergy 1995   Contrast dye   Anxiety    Arthritis 2023   Atrial fibrillation (HCC)    CAD (coronary artery disease)    CKD (chronic kidney disease) stage 3, GFR 30-59 ml/min (HCC) 08/04/2017   Claudication 01/06/2008   Lower extremity dopplers - no evidence of arterial insufficiency, normal exam   Coronary artery disease    GERD (gastroesophageal reflux disease)    Heart murmur    Hyperlipidemia    Hypertension 11/30/2010   echo- EF 55%; normal w/ mildly sclerotic aortic valve   Hypertension 11/05/2011   renal dopplers - celiac artery and SMA >50% diameter reduction, R renal artery - mildly elevated velocities 1-59% diameter reduction, L renal artery normal   Nonspecific ST-T wave electrocardiographic changes 03/26/2011   R/Lmv - EF 74%, normal perfusion all regions, ST depression w/ Lexiscan  infusion w/o assoc angina   Osteopenia    PAD (peripheral artery disease)    Peripheral vascular disease    PVD (peripheral vascular disease) 11/05/2011   doppler - R/L brachial pressures essentially equal w/o inflow disease; L sublclavian/CCA bypass graft demonstrates patent flow, no evidence of significant stenosis   Sigmoid diverticulitis     Patient Active Problem List   Diagnosis Date Noted   Anastomotic leak of intestine 10/13/2023   Right middle lobe pulmonary nodule. 3 mm 09/08/2023   IFG (impaired fasting glucose) 05/22/2023   Diverticulitis 03/03/2023   DDD  (degenerative disc disease), cervical 06/21/2022   PAT (paroxysmal atrial tachycardia) 03/01/2022   Bilateral knee pain 01/02/2022   Sprain of anterior talofibular ligament of left ankle 09/28/2020   Edema 09/03/2019   Epigastric pain 08/11/2019   Insomnia 08/10/2019   Hypocalcemia 06/03/2019   Atherosclerosis of aorta 01/04/2018   Osteoporosis 12/25/2017   CKD (chronic kidney disease) stage 3, GFR 30-59 ml/min (HCC) 08/04/2017   Primary osteoarthritis of first carpometacarpal joint of left hand 07/10/2017   Strain of right Achilles tendon 04/14/2017   Atypical nevus 08/22/2016   Acute bilateral low back pain with left-sided sciatica 06/06/2016   Menopause 12/08/2015   History of colonoscopy 07/17/2015   Irregular heartbeat 07/26/2014   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   Irritable bowel syndrome 09/21/2013   Hyperlipidemia 04/05/2013   Hypertension 12/06/2012   Peripheral arterial disease 12/06/2012   CAD, cath 2009 12/06/2012   Hyponatremia, improved holding diuretic 12/06/2012   Lymphocele of left arm 07/14/2012   Atherosclerosis of other specified arteries 04/02/2012   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   Stricture of artery 10/02/2011   Allergic dermatitis 06/09/2011    Past Surgical History:  Procedure Laterality Date   ABDOMINAL AORTIC ANEURYSM REPAIR N/A 01/27/2014   Procedure: AORTO-SUPERIOR MESENTERIC ARTERY BYPASS GRAFT;  Surgeon: Lonni GORMAN Blade, MD;  Location: [facility] OR;  Service: Vascular;  Laterality: N/A;   ABDOMINAL HYSTERECTOMY     APPENDECTOMY  BREAST BIOPSY Right 03/18/2023   US  RT BREAST BX W LOC DEV 1ST LESION IMG BX SPEC US  GUIDE 03/18/2023 [facility] MAMMOGRAPHY   CARDIAC CATHETERIZATION  06/03/2008   60% LAD, involving D2, borderline significant by IVUS, medical therapy. CFX, RCA OK.   CATARACT EXTRACTION     CHOLECYSTECTOMY     EYE SURGERY     retinal surgery   SMALL INTESTINE  SURGERY     SPINE SURGERY  07/2009   SUBCLAVIAN ARTERY STENT  1995   X's  several   SUBCLAVIAN ARTERY STENT Left 09/20/2008   LSA ISR 7x57mm Cordis Genesis on Opta premount, reduction from 80% to 0%   subclavian artery stents  01/23/2010   Left carotid to subclavian artery Bypass   VISCERAL ANGIOGRAM  01/17/2014   Procedure: VISCERAL ANGIOGRAM;  Surgeon: Lonni GORMAN Blade, MD;  Location: [facility] CATH LAB;  Service: Cardiovascular;;    OB History   No obstetric history on file.      Home Medications    Prior to Admission medications   Medication Sig Start Date End Date Taking? Authorizing Provider  aspirin  EC 81 MG tablet Take 81 mg by mouth daily.    [provider]  atenolol  (TENORMIN ) 25 MG tablet TAKE 1 TABLET DAILY 06/03/23   Croitoru, Mihai, MD  atorvastatin  (LIPITOR) 10 MG tablet TAKE 1 TABLET DAILY AT 6 P.M. 07/30/23   Croitoru, Mihai, MD  candesartan  (ATACAND ) 4 MG tablet TAKE 1 TABLET IN THE MORNING AND AT BEDTIME 04/30/23   Swinyer, Rosaline HERO, NP  clopidogrel  (PLAVIX ) 75 MG tablet TAKE 1 TABLET DAILY 07/30/23   Croitoru, Mihai, MD  clorazepate  (TRANXENE ) 7.5 MG tablet TAKE ONE-HALF (1/2) TO ONE TABLET DAILY AS NEEDED 02/23/24   Alvan Dorothyann BIRCH, MD  cyanocobalamin  1000 MCG tablet Take 1,000 mcg by mouth daily.    [provider]  EPINEPHrine  (EPIPEN  2-PAK) 0.3 mg/0.3 mL IJ SOAJ injection Inject 0.3 mLs (0.3 mg total) into the muscle as needed (for allergic reaction). 12/23/17   Joane Artist GORMAN, MD  esomeprazole  (NEXIUM ) 40 MG capsule Take 1 capsule (40 mg total) by mouth daily. Take 1 tab daily 02/23/15   Croitoru, Mihai, MD  estradiol  (ESTRACE ) 0.5 MG tablet TAKE 1 TABLET DAILY 04/24/23   Alvan Dorothyann BIRCH, MD  furosemide  (LASIX ) 20 MG tablet TAKE 1 TABLET EVERY OTHER DAY 01/08/24   Croitoru, Jerel, MD  linaclotide (LINZESS) 72 MCG capsule Take 72 mcg by mouth daily. 02/03/24   [provider]  meclizine  (ANTIVERT ) 25 MG tablet Take 25 mg by mouth  daily as needed for dizziness.    [provider]  Multiple Vitamins-Minerals (ONE A DAY WOMEN 50 PLUS PO) Take 1 tablet by mouth daily.    [provider]  VITAMIN D , CHOLECALCIFEROL , PO Take 1 tablet by mouth daily.    [provider]  zolpidem  (AMBIEN ) 5 MG tablet TAKE 1 TABLET AT BEDTIME AS NEEDED FOR SLEEP 02/23/24   Alvan Dorothyann BIRCH, MD    Family History Family History  Problem Relation Age of Onset   Heart disease Father 59       Heart Disease before age 14   Kidney disease Father    Heart attack Father    Hyperlipidemia Father    Hypertension Father    Alcohol abuse Father    Kidney failure Mother    Arthritis Mother    Diabetes Maternal Grandmother    Heart disease Paternal Grandfather     Social History  Social History   Tobacco Use   Smoking status: Former    Current packs/day: 0.00    Average packs/day: 0.3 packs/day for 20.0 years (5.0 ttl pk-yrs)    Types: Cigarettes    Start date: 09/30/1973    Quit date: 09/30/1993    Years since quitting: 30.4   Smokeless tobacco: Never  Vaping Use   Vaping status: Never Used  Substance Use Topics   Alcohol use: No   Drug use: No     Allergies   Cortisone, Dilaudid [hydromorphone hcl], Iodine, Medrol [methylprednisolone], Omnipaque [iohexol], Prednisone , Shellfish allergy, Sulfa drugs cross reactors, Z-pak [azithromycin ], Doxycycline , Ivp dye [iodinated contrast media], Levaquin  [levofloxacin ], Methylprednisolone sodium succ, Sulfa antibiotics, Augmentin  [amoxicillin -pot clavulanate], Codeine, Erythromycin, and Morphine  and codeine   Review of Systems Review of Systems  HENT:         Bleeding from bilateral ear canal since this last night  All other systems reviewed and are negative.    Physical Exam Triage Vital Signs ED Triage Vitals  Encounter Vitals Group     BP 03/19/24 1653 (!) 159/73     Girls Systolic BP Percentile --      Girls Diastolic BP Percentile --      Boys  Systolic BP Percentile --      Boys Diastolic BP Percentile --      Pulse Rate 03/19/24 1653 63     Resp 03/19/24 1653 19     Temp 03/19/24 1653 98.8 F (37.1 C)     Temp src --      SpO2 03/19/24 1653 98 %     Weight --      Height --      Head Circumference --      Peak Flow --      Pain Score 03/19/24 1652 0     Pain Loc --      Pain Education --      Exclude from Growth Chart --    No data found.  Updated Vital Signs BP (!) 159/73   Pulse 63   Temp 98.8 F (37.1 C)   Resp 19   SpO2 98%   Physical Exam Vitals and nursing note reviewed.  Constitutional:      General: She is not in acute distress.    Appearance: Normal appearance. She is normal weight. She is not ill-appearing.  HENT:     Head: Normocephalic and atraumatic.     Right Ear: External ear normal.     Left Ear: Tympanic membrane and external ear normal.     Ears:     Comments: Bilateral EAC's revealed trace blood along floor of ear canal, tiny skin avulsion of ear canal noted; right EAC occluded by excessive cerumen unable to visualize right TM: Post right EAC ear lavage: Right EAC clear with trace blood along floor of ear canal, tiny skin avulsion of ear canal noted; Right TM: Clear, retracted with good light reflex    Mouth/Throat:     Mouth: Mucous membranes are moist.     Pharynx: Oropharynx is clear.  Cardiovascular:     Rate and Rhythm: Normal rate and regular rhythm.     Pulses: Normal pulses.     Heart sounds: Normal heart sounds.  Pulmonary:     Effort: Pulmonary effort is normal.     Breath sounds: Normal breath sounds. No wheezing, rhonchi or rales.  Musculoskeletal:        General: Normal range of motion.  Cervical back: Normal range of motion and neck supple.  Skin:    General: Skin is warm and dry.  Neurological:     General: No focal deficit present.     Mental Status: She is alert and oriented to person, place, and time. Mental status is at baseline.      UC Treatments /  Results  Labs (all labs ordered are listed, but only abnormal results are displayed) Labs Reviewed - No data to display  EKG   Radiology No results found.  Procedures Procedures (including critical care time)  Medications Ordered in UC Medications - No data to display  Initial Impression / Assessment and Plan / UC Course  I have reviewed the triage vital signs and the nursing notes.  Pertinent labs & imaging results that were available during my care of the patient were reviewed by me and considered in my medical decision making (see chart for details).     MDM: 1.  Skin avulsion-Tiny skin avulsions with trace bleeding noted in bilateral EACs. Advised patient if bleeding of bilateral ear continues worsens please go to [facility] ED immediately for further evaluation.  Patient discharged home, hemodynamically stable. Final Clinical Impressions(s) / UC Diagnoses   Final diagnoses:  Skin avulsion     Discharge Instructions      Advised patient if bleeding of bilateral ear continues worsens please go to [facility] ED immediately for further evaluation.     ED Prescriptions   None    PDMP not reviewed this encounter.   Teddy Sharper, FNP 03/19/24 [phone]

## 2024-03-20 ENCOUNTER — Telehealth: Payer: Self-pay

## 2024-03-20 NOTE — Telephone Encounter (Signed)
 Called patient per call back orders. Pt reports bleeding has improved.

## 2024-04-08 ENCOUNTER — Ambulatory Visit
Admission: RE | Admit: 2024-04-08 | Discharge: 2024-04-08 | Disposition: A | Discharge status: Home / Self Care | Source: Ambulatory Visit | Attending: Family Medicine | Admitting: Family Medicine

## 2024-04-08 DIAGNOSIS — Z1231 Encounter for screening mammogram for malignant neoplasm of breast: Secondary | ICD-10-CM | POA: Diagnosis not present

## 2024-04-13 ENCOUNTER — Ambulatory Visit: Payer: Self-pay | Admitting: Family Medicine

## 2024-04-13 NOTE — Progress Notes (Signed)
 Please call patient. Normal mammogram.  Repeat in 1 year.

## 2024-04-27 DIAGNOSIS — R0789 Other chest pain: Secondary | ICD-10-CM | POA: Diagnosis not present

## 2024-04-27 DIAGNOSIS — Z79899 Other long term (current) drug therapy: Secondary | ICD-10-CM | POA: Diagnosis not present

## 2024-04-27 DIAGNOSIS — Z882 Allergy status to sulfonamides status: Secondary | ICD-10-CM | POA: Diagnosis not present

## 2024-04-27 DIAGNOSIS — R0602 Shortness of breath: Secondary | ICD-10-CM | POA: Diagnosis not present

## 2024-04-27 DIAGNOSIS — R1033 Periumbilical pain: Secondary | ICD-10-CM | POA: Diagnosis not present

## 2024-04-27 DIAGNOSIS — R110 Nausea: Secondary | ICD-10-CM | POA: Diagnosis not present

## 2024-04-27 DIAGNOSIS — N183 Chronic kidney disease, stage 3 unspecified: Secondary | ICD-10-CM | POA: Diagnosis not present

## 2024-04-27 DIAGNOSIS — Z880 Allergy status to penicillin: Secondary | ICD-10-CM | POA: Diagnosis not present

## 2024-04-27 DIAGNOSIS — I4891 Unspecified atrial fibrillation: Secondary | ICD-10-CM | POA: Diagnosis not present

## 2024-04-27 DIAGNOSIS — G4489 Other headache syndrome: Secondary | ICD-10-CM | POA: Diagnosis not present

## 2024-04-27 DIAGNOSIS — Z885 Allergy status to narcotic agent status: Secondary | ICD-10-CM | POA: Diagnosis not present

## 2024-04-27 DIAGNOSIS — E785 Hyperlipidemia, unspecified: Secondary | ICD-10-CM | POA: Diagnosis not present

## 2024-04-27 DIAGNOSIS — I10 Essential (primary) hypertension: Secondary | ICD-10-CM | POA: Diagnosis not present

## 2024-04-27 DIAGNOSIS — R1084 Generalized abdominal pain: Secondary | ICD-10-CM | POA: Diagnosis not present

## 2024-04-27 DIAGNOSIS — I2510 Atherosclerotic heart disease of native coronary artery without angina pectoris: Secondary | ICD-10-CM | POA: Diagnosis not present

## 2024-04-27 DIAGNOSIS — Z9049 Acquired absence of other specified parts of digestive tract: Secondary | ICD-10-CM | POA: Diagnosis not present

## 2024-04-27 DIAGNOSIS — Z7902 Long term (current) use of antithrombotics/antiplatelets: Secondary | ICD-10-CM | POA: Diagnosis not present

## 2024-04-27 DIAGNOSIS — Z888 Allergy status to other drugs, medicaments and biological substances status: Secondary | ICD-10-CM | POA: Diagnosis not present

## 2024-04-27 DIAGNOSIS — R21 Rash and other nonspecific skin eruption: Secondary | ICD-10-CM | POA: Diagnosis not present

## 2024-04-27 DIAGNOSIS — R079 Chest pain, unspecified: Secondary | ICD-10-CM | POA: Diagnosis not present

## 2024-04-27 DIAGNOSIS — Z8616 Personal history of COVID-19: Secondary | ICD-10-CM | POA: Diagnosis not present

## 2024-04-27 DIAGNOSIS — Z7982 Long term (current) use of aspirin: Secondary | ICD-10-CM | POA: Diagnosis not present

## 2024-04-27 DIAGNOSIS — R109 Unspecified abdominal pain: Secondary | ICD-10-CM | POA: Diagnosis not present

## 2024-04-27 DIAGNOSIS — R519 Headache, unspecified: Secondary | ICD-10-CM | POA: Diagnosis not present

## 2024-04-27 DIAGNOSIS — Z881 Allergy status to other antibiotic agents status: Secondary | ICD-10-CM | POA: Diagnosis not present

## 2024-04-27 DIAGNOSIS — Z87891 Personal history of nicotine dependence: Secondary | ICD-10-CM | POA: Diagnosis not present

## 2024-04-27 DIAGNOSIS — F419 Anxiety disorder, unspecified: Secondary | ICD-10-CM | POA: Diagnosis not present

## 2024-04-27 DIAGNOSIS — N390 Urinary tract infection, site not specified: Secondary | ICD-10-CM | POA: Diagnosis not present

## 2024-04-27 DIAGNOSIS — I129 Hypertensive chronic kidney disease with stage 1 through stage 4 chronic kidney disease, or unspecified chronic kidney disease: Secondary | ICD-10-CM | POA: Diagnosis not present

## 2024-05-03 ENCOUNTER — Other Ambulatory Visit: Payer: Self-pay | Admitting: Family Medicine

## 2024-05-03 DIAGNOSIS — Z961 Presence of intraocular lens: Secondary | ICD-10-CM | POA: Diagnosis not present

## 2024-05-03 DIAGNOSIS — I10 Essential (primary) hypertension: Secondary | ICD-10-CM | POA: Diagnosis not present

## 2024-05-03 DIAGNOSIS — H16121 Filamentary keratitis, right eye: Secondary | ICD-10-CM | POA: Diagnosis not present

## 2024-05-05 ENCOUNTER — Ambulatory Visit: Admitting: Family Medicine

## 2024-05-05 ENCOUNTER — Encounter: Payer: Self-pay | Admitting: Family Medicine

## 2024-05-05 VITALS — BP 140/64 | HR 56 | Ht 64.0 in | Wt 143.4 lb

## 2024-05-05 DIAGNOSIS — R0789 Other chest pain: Secondary | ICD-10-CM

## 2024-05-05 DIAGNOSIS — N3000 Acute cystitis without hematuria: Secondary | ICD-10-CM

## 2024-05-05 DIAGNOSIS — I10 Essential (primary) hypertension: Secondary | ICD-10-CM | POA: Diagnosis not present

## 2024-05-05 NOTE — Assessment & Plan Note (Signed)
 Pressure is back to baseline today typically she runs in the 140s and 150s and cardiology is okay with her staying there because her diastolic tends to drop.  Continue current regimen.

## 2024-05-05 NOTE — Progress Notes (Signed)
 Established Patient Office Visit  Patient ID: Traci [name], female    DOB: 01/19/1946  Age: 78 y.o. MRN: [id number] PCP: Alvan Dorothyann BIRCH, MD  Chief Complaint  Patient presents with   Hospitalization Follow-up    Bp, abdominal pain     Subjective:     HPI  Traci [name] is here today for hospital follow-up.  She went to the emergency department on November 11.  She said that night before bedtime she took a Tylenol  PM which she has never done before though she has taken Tylenol  before and she has taken Benadryl  before without any problems.  She said she woke up in the middle the night with her chest hurting feeling short of breath.  Her blood pressure was 220/190 so she went ahead and went to the emergency department at [facility].  She said they did keep her overnight for observation.  They did CT of chest abdomen and pelvis as well as a head CT without any new or acute findings.  She did have a urinary tract infection for which they did treat her with Augmentin  and they gave her reassurance.  Her blood pressures gradually came down until close to her baseline usually around 150 and then they discharged her home.  She does have an upcoming follow-up with cardiology in December which is her routine appointment and a follow-up with vascular surgery in January she believes.  The only thing else unusual around this time is that they had noticed a red spot/bump on her back.  She is not sure if it was a bug bite but it seems to have resolved on its own.  But she was not sure if that could have been related to the episode as well.  She did want us  to go ahead and remove Augmentin  from her intolerance Linzess since she has taken it twice now without any side effects.  Hospital notes and imaging reports reviewed from [facility].    ROS    Objective:     BP (!) 140/64   Pulse (!) 56   Ht 5' 4 (1.626 m)   Wt 143 lb 6.4 oz (65 kg)   SpO2 100%   BMI 24.61 kg/m     Physical Exam Vitals and nursing note reviewed.  Constitutional:      Appearance: Normal appearance.  HENT:     Head: Normocephalic and atraumatic.  Eyes:     Conjunctiva/sclera: Conjunctivae normal.  Cardiovascular:     Rate and Rhythm: Normal rate and regular rhythm.  Pulmonary:     Effort: Pulmonary effort is normal.     Breath sounds: Normal breath sounds.  Skin:    General: Skin is warm and dry.  Neurological:     Mental Status: She is alert.  Psychiatric:        Mood and Affect: Mood normal.      No results found for any visits on 05/05/24.    The ASCVD Risk score (Arnett DK, et al., 2019) failed to calculate for the following reasons:   Unable to determine if patient is Non-Hispanic African American    Assessment & Plan:   Problem List Items Addressed This Visit       Cardiovascular and Mediastinum   Hypertension - Primary (Chronic)   Pressure is back to baseline today typically she runs in the 140s and 150s and cardiology is okay with her staying there because her diastolic tends to drop.  Continue current regimen.  Other Visit Diagnoses       Acute cystitis without hematuria         Atypical chest pain           UTI-she has not had any dysuria she did complete the antibiotics.  We also updated her intolerance list.  We did not add Tylenol  PM to the intolerance list since she has taken individually Tylenol  and Benadryl  on separate occasions and has tolerated them but just encouraged her to not to use it again before bedtime just in case it was related to her symptoms that night.  This pain seems to have resolved.  Return if symptoms worsen or fail to improve.    [name], MD [facility] Primary Care & Sports Medicine at [facility]

## 2024-05-05 NOTE — Progress Notes (Signed)
 Pt reports that her abdominal pain is not bothersome some days and other days its painful.   She has a follow up appointment with the surgeon in December.

## 2024-05-19 ENCOUNTER — Other Ambulatory Visit: Payer: Self-pay | Admitting: Family Medicine

## 2024-05-26 ENCOUNTER — Other Ambulatory Visit: Payer: Self-pay | Admitting: Cardiovascular Disease

## 2024-05-28 ENCOUNTER — Other Ambulatory Visit: Payer: Self-pay | Admitting: Family Medicine

## 2024-05-28 ENCOUNTER — Telehealth: Payer: Self-pay

## 2024-05-28 DIAGNOSIS — G4700 Insomnia, unspecified: Secondary | ICD-10-CM

## 2024-05-28 MED ORDER — CLORAZEPATE DIPOTASSIUM 7.5 MG PO TABS: ORAL_TABLET | ORAL | 0 refills | Status: AC

## 2024-05-28 NOTE — Telephone Encounter (Signed)
 Can we clarify with pharmacy why? How many did they dispense, did they only do a partial fill? I sent a 90 days upply in Nov

## 2024-05-28 NOTE — Telephone Encounter (Signed)
 Copied from CRM #[id number]. Topic: Clinical - Prescription Issue >> May 28, 2024 10:18 AM Tonda B wrote: Reason for CRM: patient  is calling in saying that she spoke to her pharmacy and they are asking to resend the rx clorazepate  (TRANXENE ) 7.5 MG tablet if any questions please call pt back  [phone]

## 2024-05-28 NOTE — Telephone Encounter (Signed)
 TRANXENE  Last OV: 05/05/24 Next OV: 06/24/24 Last RF: 05/05/24

## 2024-05-28 NOTE — Telephone Encounter (Signed)
 Spoke with [phone]. Was told that the prescription for Tranxene  written on 05/05/2024 was never received at their pharmacy. In checking patient list - it does show as print rather than e-prescribe. States as this is a controlled medication would require a new prescription be sent in.

## 2024-05-28 NOTE — Telephone Encounter (Signed)
 ZOLPIDEM  Last OV: 05/05/24 Next OV: 07/02/24 Last RF: 02/23/24

## 2024-05-28 NOTE — Telephone Encounter (Signed)
 Ok resent

## 2024-06-18 ENCOUNTER — Other Ambulatory Visit: Payer: Self-pay | Admitting: Nurse Practitioner

## 2024-06-18 ENCOUNTER — Other Ambulatory Visit: Payer: Self-pay | Admitting: Cardiovascular Disease

## 2024-06-23 ENCOUNTER — Other Ambulatory Visit: Payer: Self-pay | Admitting: Cardiovascular Disease

## 2024-06-24 ENCOUNTER — Ambulatory Visit: Admitting: Family Medicine

## 2024-06-24 ENCOUNTER — Encounter: Payer: Self-pay | Admitting: Family Medicine

## 2024-06-24 VITALS — BP 138/60 | HR 57 | Ht 64.0 in | Wt 147.2 lb

## 2024-06-24 DIAGNOSIS — R7301 Impaired fasting glucose: Secondary | ICD-10-CM | POA: Diagnosis not present

## 2024-06-24 DIAGNOSIS — N1831 Chronic kidney disease, stage 3a: Secondary | ICD-10-CM

## 2024-06-24 NOTE — Progress Notes (Signed)
 "  Established Patient Office Visit  Patient ID: Traci [name], female    DOB: [birth year]  Age: 79 y.o. MRN: [id number] PCP: Alvan Dorothyann BIRCH, MD  Chief Complaint  Patient presents with   Medical Management of Chronic Issues    Subjective:     HPI  Discussed the use of AI scribe software for clinical note transcription with the patient, who gave verbal consent to proceed.  History of Present Illness Traci [name] is a 79 year old female who presents with sleep disturbances and gastrointestinal concerns.  Sleep disturbances - Ongoing difficulty with sleep, attributed to postmenopausal changes - Intermittent use of melatonin 5 mg and occasional use of sleeping pills (ambien  )  - Some nights with inability to sleep, sometimes only three hours of sleep before awakening - Watches a movie before attempting to fall back asleep - Has considered magnesium  supplements but has not started them - No need for Ambien  refill; has adequate supply at home  Gastrointestinal symptoms - Has not returned for one-year post-surgery checkup - Previous regimen of Metamucil and Miralax  was excessive - Currently using Linzess 72 mg every two to three days, which is effective - Able to tolerate most foods; cabbage causes gas and bloating - Occasional soreness on left side, self-resolving  Abdominal mass or hernia concern - Concern about possible hernia, previously thought to be a mesenteric issue - Mass still 'pops up' occasionally  Preventive health maintenance - Completed mammogram in October 2025 - Due for bone density test - No blood work since spring; due for routine laboratory tests     ROS    Objective:     BP 138/60   Pulse (!) 57   Ht 5' 4 (1.626 m)   Wt 147 lb 3.2 oz (66.8 kg)   SpO2 99%   BMI 25.27 kg/m    Physical Exam Vitals and nursing note reviewed.  Constitutional:      Appearance: Normal appearance.  HENT:     Head:  Normocephalic and atraumatic.  Eyes:     Conjunctiva/sclera: Conjunctivae normal.  Cardiovascular:     Rate and Rhythm: Normal rate and regular rhythm.  Pulmonary:     Effort: Pulmonary effort is normal.     Breath sounds: Normal breath sounds.  Skin:    General: Skin is warm and dry.  Neurological:     Mental Status: She is alert.  Psychiatric:        Mood and Affect: Mood normal.      Results for orders placed or performed in visit on 06/24/24  CMP14+EGFR  Result Value Ref Range   Glucose 96 70 - 99 mg/dL   BUN 13 8 - 27 mg/dL   Creatinine, Ser 8.98 (H) 0.57 - 1.00 mg/dL   eGFR 57 (L) >40 fO/fpw/8.26   BUN/Creatinine Ratio 13 12 - 28   Sodium 144 134 - 144 mmol/L   Potassium 4.9 3.5 - 5.2 mmol/L   Chloride 106 96 - 106 mmol/L   CO2 25 20 - 29 mmol/L   Calcium  8.9 8.7 - 10.3 mg/dL   Total Protein 7.2 6.0 - 8.5 g/dL   Albumin  4.2 3.8 - 4.8 g/dL   Globulin, Total 3.0 1.5 - 4.5 g/dL   Bilirubin Total 0.3 0.0 - 1.2 mg/dL   Alkaline Phosphatase 69 49 - 135 IU/L   AST 22 0 - 40 IU/L   ALT 11 0 - 32 IU/L  Lipid panel  Result Value Ref Range  Cholesterol, Total 132 100 - 199 mg/dL   Triglycerides 866 0 - 149 mg/dL   HDL 51 >60 mg/dL   VLDL Cholesterol Cal 23 5 - 40 mg/dL   LDL Chol Calc (NIH) 58 0 - 99 mg/dL   Chol/HDL Ratio 2.6 0.0 - 4.4 ratio  CBC  Result Value Ref Range   WBC 5.3 3.4 - 10.8 x10E3/uL   RBC 3.94 3.77 - 5.28 x10E6/uL   Hemoglobin 11.4 11.1 - 15.9 g/dL   Hematocrit 64.2 65.9 - 46.6 %   MCV 91 79 - 97 fL   MCH 28.9 26.6 - 33.0 pg   MCHC 31.9 31.5 - 35.7 g/dL   RDW 87.6 88.2 - 84.5 %   Platelets 330 150 - 450 x10E3/uL  Hemoglobin A1c  Result Value Ref Range   Hgb A1c MFr Bld 5.7 (H) 4.8 - 5.6 %   Est. average glucose Bld gHb Est-mCnc 117 mg/dL  Urine Microalbumin w/creat. ratio  Result Value Ref Range   Creatinine, Urine 87.7 Not Estab. mg/dL   Microalbumin, Urine 54.0 Not Estab. ug/mL   Microalb/Creat Ratio 52 (H) 0 - 29 mg/g creat       The ASCVD Risk score (Arnett DK, et al., 2019) failed to calculate for the following reasons:   Unable to determine if patient is Non-Hispanic African American    Assessment & Plan:   Problem List Items Addressed This Visit       Endocrine   IFG (impaired fasting glucose) - Primary   Relevant Orders   CMP14+EGFR (Completed)   Lipid panel (Completed)   CBC (Completed)   Hemoglobin A1c (Completed)   Urine Microalbumin w/creat. ratio (Completed)     Genitourinary   CKD stage G3a/A2, GFR 45-59 and albumin  creatinine ratio 30-299 mg/g (HCC)    Assessment and Plan Assessment & Plan Insomnia Chronic insomnia likely worsened by postmenopausal status. Discussed magnesium  use for sleep with caution for gastrointestinal side effects. - Continue melatonin 5 mg as needed. - Consider magnesium  1 tablet at night, monitor for diarrhea. - Ordered blood work to monitor magnesium  levels if long-term use is considered. - No refill for Ambien  at this time.  Constipation Chronic constipation effectively managed with Lansest 72 mg every 2-3 days. Previous regimen was excessive. - Continue Lansest 72 mg every 2-3 days as needed.  Primary hypertension Blood pressure management ongoing with good home readings. - Rechecked blood pressure during the visit.  General health maintenance Due for bone density screening. Blood work due for routine monitoring. - Ordered bone density screening. - Ordered routine blood work including kidney function tests.    Return in about 4 months (around 10/22/2024).    [name], MD [facility] Primary Care & Sports Medicine at [facility]   "

## 2024-06-25 ENCOUNTER — Encounter: Payer: Self-pay | Admitting: Family Medicine

## 2024-06-25 ENCOUNTER — Ambulatory Visit: Payer: Self-pay | Admitting: Family Medicine

## 2024-06-25 LAB — CMP14+EGFR
ALT: 11 IU/L (ref 0–32)
AST: 22 IU/L (ref 0–40)
Albumin: 4.2 g/dL (ref 3.8–4.8)
Alkaline Phosphatase: 69 IU/L (ref 49–135)
BUN/Creatinine Ratio: 13 (ref 12–28)
BUN: 13 mg/dL (ref 8–27)
Bilirubin Total: 0.3 mg/dL (ref 0.0–1.2)
CO2: 25 mmol/L (ref 20–29)
Calcium: 8.9 mg/dL (ref 8.7–10.3)
Chloride: 106 mmol/L (ref 96–106)
Creatinine, Ser: 1.01 mg/dL — ABNORMAL HIGH (ref 0.57–1.00)
Globulin, Total: 3 g/dL (ref 1.5–4.5)
Glucose: 96 mg/dL (ref 70–99)
Potassium: 4.9 mmol/L (ref 3.5–5.2)
Sodium: 144 mmol/L (ref 134–144)
Total Protein: 7.2 g/dL (ref 6.0–8.5)
eGFR: 57 mL/min/1.73 — ABNORMAL LOW

## 2024-06-25 LAB — MICROALBUMIN / CREATININE URINE RATIO
Creatinine, Urine: 87.7 mg/dL
Microalb/Creat Ratio: 52 mg/g{creat} — ABNORMAL HIGH (ref 0–29)
Microalbumin, Urine: 45.9 ug/mL

## 2024-06-25 LAB — CBC
Hematocrit: 35.7 % (ref 34.0–46.6)
Hemoglobin: 11.4 g/dL (ref 11.1–15.9)
MCH: 28.9 pg (ref 26.6–33.0)
MCHC: 31.9 g/dL (ref 31.5–35.7)
MCV: 91 fL (ref 79–97)
Platelets: 330 x10E3/uL (ref 150–450)
RBC: 3.94 x10E6/uL (ref 3.77–5.28)
RDW: 12.3 % (ref 11.7–15.4)
WBC: 5.3 x10E3/uL (ref 3.4–10.8)

## 2024-06-25 LAB — LIPID PANEL
Chol/HDL Ratio: 2.6 ratio (ref 0.0–4.4)
Cholesterol, Total: 132 mg/dL (ref 100–199)
HDL: 51 mg/dL
LDL Chol Calc (NIH): 58 mg/dL (ref 0–99)
Triglycerides: 133 mg/dL (ref 0–149)
VLDL Cholesterol Cal: 23 mg/dL (ref 5–40)

## 2024-06-25 LAB — HEMOGLOBIN A1C
Est. average glucose Bld gHb Est-mCnc: 117 mg/dL
Hgb A1c MFr Bld: 5.7 % — ABNORMAL HIGH (ref 4.8–5.6)

## 2024-06-25 NOTE — Progress Notes (Signed)
 Hi [name], A1c stable at 5.7.  Kidney function stable at 1.0 which is great.  And just spilling a little extra protein into the urine but not in a worrisome range.  We will keep an eye on that.  Cholesterol and blood count look great.

## 2024-06-29 ENCOUNTER — Other Ambulatory Visit: Payer: Self-pay | Admitting: Family Medicine

## 2024-07-08 ENCOUNTER — Encounter: Payer: Self-pay | Admitting: Family Medicine
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
## 2024-07-14 ENCOUNTER — Telehealth: Payer: Self-pay | Admitting: Cardiovascular Disease

## 2024-07-19 ENCOUNTER — Other Ambulatory Visit: Payer: Self-pay

## 2024-07-19 DIAGNOSIS — K551 Chronic vascular disorders of intestine: Secondary | ICD-10-CM

## 2024-07-21 MED ORDER — ATENOLOL 25 MG PO TABS: 25.0000 mg | ORAL_TABLET | Freq: Every day | ORAL | 0 refills | Status: AC

## 2024-07-21 MED ORDER — ATORVASTATIN CALCIUM 10 MG PO TABS: 10.0000 mg | ORAL_TABLET | Freq: Every day | ORAL | 0 refills | Status: AC

## 2024-07-21 MED ORDER — CLOPIDOGREL BISULFATE 75 MG PO TABS: 75.0000 mg | ORAL_TABLET | Freq: Every day | ORAL | 0 refills | Status: AC

## 2024-07-21 NOTE — Addendum Note (Signed)
 Addended by: BLUFORD ROSINA SAILOR on: 07/21/2024 12:10 PM   Modules accepted: Orders

## 2024-07-21 NOTE — Telephone Encounter (Addendum)
 Candesartan  filled 07/20/24. Refill sent for the rest to last until appt date.

## 2024-07-21 NOTE — Telephone Encounter (Signed)
" °*  STAT* If patient is at the pharmacy, call can be transferred to refill team.   1. Which medications need to be refilled? (please list name of each medication and dose if known)   atorvastatin  (LIPITOR) 10 MG tablet  atenolol  (TENORMIN ) 25 MG tablet  candesartan  (ATACAND ) 4 MG tablet  clopidogrel  (PLAVIX ) 75 MG tablet   2. Would you like to learn more about the convenience, safety, & potential cost savings by using the [facility]?   3. Are you open to using the [facility] (Type [facility]. ).  4. Which pharmacy/location (including street and city if local pharmacy) is medication to be sent to?  [facility] - [place], [place] - [address]   5. Do they need a 30 day or 90 day supply?   90 day  Patient stated she still has some medication.  Patient has appointment scheduled with EMERSON Bane, NP on 5/14. "

## 2024-07-22 ENCOUNTER — Ambulatory Visit (HOSPITAL_BASED_OUTPATIENT_CLINIC_OR_DEPARTMENT_OTHER): Admitting: Nurse Practitioner

## 2024-07-22 ENCOUNTER — Telehealth: Payer: Self-pay | Admitting: Cardiovascular Disease

## 2024-07-22 MED ORDER — CANDESARTAN CILEXETIL 4 MG PO TABS: 4.0000 mg | ORAL_TABLET | Freq: Two times a day (BID) | ORAL | 3 refills | Status: AC

## 2024-07-22 NOTE — Telephone Encounter (Signed)
 Pt c/o medication issue:  1. Name of Medication: candesartan  (ATACAND ) 4 MG tablet   2. How are you currently taking this medication (dosage and times per day)? Unknown  3. Are you having a reaction (difficulty breathing--STAT)? No  4. What is your medication issue? Pharmacy is requesting a call back with directions on how patient needs to be taking this medication.   Phone: [phone] Reference # [id number]

## 2024-07-22 NOTE — Telephone Encounter (Signed)
 Last time I saw her, this medication was prescribed as candesartan  4 mg twice daily.  I am not sure if it has been changed by one of her other providers but that is how I would represcribe it.

## 2024-08-12 ENCOUNTER — Ambulatory Visit (HOSPITAL_COMMUNITY)

## 2024-08-12 ENCOUNTER — Ambulatory Visit: Admitting: Vascular Surgery

## 2024-10-21 ENCOUNTER — Ambulatory Visit

## 2024-10-28 ENCOUNTER — Ambulatory Visit (HOSPITAL_BASED_OUTPATIENT_CLINIC_OR_DEPARTMENT_OTHER): Admitting: Nurse Practitioner

## 2024-12-23 ENCOUNTER — Ambulatory Visit: Admitting: Family Medicine
